# Patient Record
Sex: Female | Born: 1965
Health system: Southern US, Community
[De-identification: ages and names within clinical notes are randomized; demographics above are authoritative.]

## PROBLEM LIST (undated history)

## (undated) DIAGNOSIS — I2510 Atherosclerotic heart disease of native coronary artery without angina pectoris: Secondary | ICD-10-CM

## (undated) DIAGNOSIS — G4700 Insomnia, unspecified: Secondary | ICD-10-CM

## (undated) DIAGNOSIS — Z95810 Presence of automatic (implantable) cardiac defibrillator: Secondary | ICD-10-CM

## (undated) DIAGNOSIS — I219 Acute myocardial infarction, unspecified: Secondary | ICD-10-CM

## (undated) DIAGNOSIS — M329 Systemic lupus erythematosus, unspecified: Secondary | ICD-10-CM

## (undated) DIAGNOSIS — G8929 Other chronic pain: Secondary | ICD-10-CM

## (undated) DIAGNOSIS — I4729 Other ventricular tachycardia: Secondary | ICD-10-CM

## (undated) DIAGNOSIS — I255 Ischemic cardiomyopathy: Secondary | ICD-10-CM

## (undated) DIAGNOSIS — F329 Major depressive disorder, single episode, unspecified: Secondary | ICD-10-CM

## (undated) DIAGNOSIS — I4901 Ventricular fibrillation: Secondary | ICD-10-CM

## (undated) DIAGNOSIS — Z720 Tobacco use: Secondary | ICD-10-CM

## (undated) DIAGNOSIS — I429 Cardiomyopathy, unspecified: Secondary | ICD-10-CM

## (undated) DIAGNOSIS — E785 Hyperlipidemia, unspecified: Secondary | ICD-10-CM

## (undated) DIAGNOSIS — N052 Unspecified nephritic syndrome with diffuse membranous glomerulonephritis: Secondary | ICD-10-CM

## (undated) DIAGNOSIS — I472 Ventricular tachycardia, unspecified: Secondary | ICD-10-CM

## (undated) DIAGNOSIS — F32A Depression, unspecified: Secondary | ICD-10-CM

## (undated) DIAGNOSIS — G43909 Migraine, unspecified, not intractable, without status migrainosus: Secondary | ICD-10-CM

## (undated) DIAGNOSIS — I5020 Unspecified systolic (congestive) heart failure: Secondary | ICD-10-CM

## (undated) DIAGNOSIS — M1710 Unilateral primary osteoarthritis, unspecified knee: Secondary | ICD-10-CM

## (undated) DIAGNOSIS — K219 Gastro-esophageal reflux disease without esophagitis: Secondary | ICD-10-CM

## (undated) HISTORY — DX: Unilateral primary osteoarthritis, unspecified knee: M17.10

## (undated) HISTORY — DX: Gastro-esophageal reflux disease without esophagitis: K21.9

## (undated) HISTORY — DX: Tobacco use: Z72.0

## (undated) HISTORY — DX: Depression, unspecified: F32.A

## (undated) HISTORY — DX: Hyperlipidemia, unspecified: E78.5

## (undated) HISTORY — DX: Insomnia, unspecified: G47.00

## (undated) HISTORY — DX: Presence of automatic (implantable) cardiac defibrillator: Z95.810

## (undated) HISTORY — PX: ABDOMINAL HYSTERECTOMY: SHX81

## (undated) HISTORY — DX: Cardiomyopathy, unspecified: I42.9

## (undated) HISTORY — DX: Migraine, unspecified, not intractable, without status migrainosus: G43.909

## (undated) HISTORY — DX: Ischemic cardiomyopathy: I25.5

## (undated) HISTORY — PX: CHOLECYSTECTOMY: SHX55

## (undated) HISTORY — DX: Major depressive disorder, single episode, unspecified: F32.9

## (undated) HISTORY — DX: Systemic lupus erythematosus, unspecified: M32.9

## (undated) HISTORY — PX: OTHER SURGICAL HISTORY: SHX169

---

## 1999-10-05 ENCOUNTER — Emergency Department (HOSPITAL_COMMUNITY): Admission: EM | Admit: 1999-10-05 | Discharge: 1999-10-05 | Payer: Self-pay

## 2000-08-18 HISTORY — PX: ROTATOR CUFF REPAIR: SHX139

## 2000-12-17 ENCOUNTER — Encounter (HOSPITAL_COMMUNITY): Admission: RE | Admit: 2000-12-17 | Discharge: 2001-01-16 | Payer: Self-pay | Admitting: Rheumatology

## 2000-12-31 ENCOUNTER — Emergency Department (HOSPITAL_COMMUNITY): Admission: EM | Admit: 2000-12-31 | Discharge: 2000-12-31 | Payer: Self-pay | Admitting: Emergency Medicine

## 2001-01-01 ENCOUNTER — Ambulatory Visit (HOSPITAL_COMMUNITY): Admission: RE | Admit: 2001-01-01 | Discharge: 2001-01-01 | Payer: Self-pay | Admitting: *Deleted

## 2001-01-01 ENCOUNTER — Encounter: Payer: Self-pay | Admitting: *Deleted

## 2001-03-15 ENCOUNTER — Encounter: Payer: Self-pay | Admitting: Family Medicine

## 2001-03-15 ENCOUNTER — Ambulatory Visit (HOSPITAL_COMMUNITY): Admission: RE | Admit: 2001-03-15 | Discharge: 2001-03-15 | Payer: Self-pay | Admitting: Family Medicine

## 2001-06-12 ENCOUNTER — Emergency Department (HOSPITAL_COMMUNITY): Admission: EM | Admit: 2001-06-12 | Discharge: 2001-06-12 | Payer: Self-pay | Admitting: Emergency Medicine

## 2001-06-30 ENCOUNTER — Encounter (HOSPITAL_COMMUNITY): Admission: RE | Admit: 2001-06-30 | Discharge: 2001-07-30 | Payer: Self-pay | Admitting: Family Medicine

## 2001-07-13 ENCOUNTER — Ambulatory Visit (HOSPITAL_COMMUNITY): Admission: RE | Admit: 2001-07-13 | Discharge: 2001-07-13 | Payer: Self-pay | Admitting: Orthopaedic Surgery

## 2001-07-13 ENCOUNTER — Encounter: Payer: Self-pay | Admitting: Orthopaedic Surgery

## 2001-07-30 ENCOUNTER — Ambulatory Visit (HOSPITAL_COMMUNITY): Admission: RE | Admit: 2001-07-30 | Discharge: 2001-07-30 | Payer: Self-pay | Admitting: Family Medicine

## 2001-07-30 ENCOUNTER — Encounter: Payer: Self-pay | Admitting: Family Medicine

## 2001-08-09 ENCOUNTER — Encounter (HOSPITAL_COMMUNITY): Admission: RE | Admit: 2001-08-09 | Discharge: 2001-09-08 | Payer: Self-pay | Admitting: Family Medicine

## 2001-09-15 ENCOUNTER — Emergency Department (HOSPITAL_COMMUNITY): Admission: EM | Admit: 2001-09-15 | Discharge: 2001-09-15 | Payer: Self-pay | Admitting: Emergency Medicine

## 2001-10-13 ENCOUNTER — Inpatient Hospital Stay (HOSPITAL_COMMUNITY): Admission: AD | Admit: 2001-10-13 | Discharge: 2001-10-16 | Payer: Self-pay | Admitting: *Deleted

## 2001-10-13 ENCOUNTER — Emergency Department (HOSPITAL_COMMUNITY): Admission: EM | Admit: 2001-10-13 | Discharge: 2001-10-13 | Payer: Self-pay | Admitting: Emergency Medicine

## 2001-10-13 ENCOUNTER — Encounter: Payer: Self-pay | Admitting: Emergency Medicine

## 2001-11-29 ENCOUNTER — Emergency Department (HOSPITAL_COMMUNITY): Admission: EM | Admit: 2001-11-29 | Discharge: 2001-11-30 | Payer: Self-pay | Admitting: Emergency Medicine

## 2001-11-29 ENCOUNTER — Encounter: Payer: Self-pay | Admitting: Emergency Medicine

## 2001-12-29 ENCOUNTER — Encounter: Payer: Self-pay | Admitting: Emergency Medicine

## 2001-12-29 ENCOUNTER — Emergency Department (HOSPITAL_COMMUNITY): Admission: EM | Admit: 2001-12-29 | Discharge: 2001-12-29 | Payer: Self-pay | Admitting: Emergency Medicine

## 2001-12-30 ENCOUNTER — Inpatient Hospital Stay (HOSPITAL_COMMUNITY): Admission: EM | Admit: 2001-12-30 | Discharge: 2002-01-02 | Payer: Self-pay | Admitting: Cardiology

## 2002-01-12 ENCOUNTER — Encounter: Payer: Self-pay | Admitting: Family Medicine

## 2002-01-12 ENCOUNTER — Ambulatory Visit (HOSPITAL_COMMUNITY): Admission: RE | Admit: 2002-01-12 | Discharge: 2002-01-12 | Payer: Self-pay | Admitting: Family Medicine

## 2002-01-14 ENCOUNTER — Ambulatory Visit (HOSPITAL_COMMUNITY): Admission: RE | Admit: 2002-01-14 | Discharge: 2002-01-14 | Payer: Self-pay | Admitting: Cardiology

## 2002-10-06 ENCOUNTER — Encounter: Payer: Self-pay | Admitting: *Deleted

## 2002-10-06 ENCOUNTER — Inpatient Hospital Stay (HOSPITAL_COMMUNITY): Admission: EM | Admit: 2002-10-06 | Discharge: 2002-10-09 | Payer: Self-pay | Admitting: Emergency Medicine

## 2002-10-07 ENCOUNTER — Encounter: Payer: Self-pay | Admitting: Vascular Surgery

## 2002-10-13 ENCOUNTER — Ambulatory Visit (HOSPITAL_COMMUNITY): Admission: RE | Admit: 2002-10-13 | Discharge: 2002-10-13 | Payer: Self-pay | Admitting: *Deleted

## 2002-12-13 ENCOUNTER — Observation Stay (HOSPITAL_COMMUNITY): Admission: RE | Admit: 2002-12-13 | Discharge: 2002-12-14 | Payer: Self-pay | Admitting: General Surgery

## 2003-02-09 ENCOUNTER — Encounter: Payer: Self-pay | Admitting: Emergency Medicine

## 2003-02-09 ENCOUNTER — Emergency Department (HOSPITAL_COMMUNITY): Admission: EM | Admit: 2003-02-09 | Discharge: 2003-02-10 | Payer: Self-pay | Admitting: Emergency Medicine

## 2003-06-29 ENCOUNTER — Inpatient Hospital Stay (HOSPITAL_COMMUNITY): Admission: EM | Admit: 2003-06-29 | Discharge: 2003-07-01 | Payer: Self-pay | Admitting: Emergency Medicine

## 2003-06-30 ENCOUNTER — Encounter: Payer: Self-pay | Admitting: Cardiology

## 2003-08-14 ENCOUNTER — Emergency Department (HOSPITAL_COMMUNITY): Admission: EM | Admit: 2003-08-14 | Discharge: 2003-08-14 | Payer: Self-pay | Admitting: Emergency Medicine

## 2003-09-02 ENCOUNTER — Emergency Department (HOSPITAL_COMMUNITY): Admission: EM | Admit: 2003-09-02 | Discharge: 2003-09-02 | Payer: Self-pay | Admitting: Emergency Medicine

## 2003-11-08 ENCOUNTER — Ambulatory Visit (HOSPITAL_COMMUNITY): Admission: RE | Admit: 2003-11-08 | Discharge: 2003-11-08 | Payer: Self-pay | Admitting: Orthopedic Surgery

## 2003-11-20 ENCOUNTER — Emergency Department (HOSPITAL_COMMUNITY): Admission: EM | Admit: 2003-11-20 | Discharge: 2003-11-21 | Payer: Self-pay | Admitting: *Deleted

## 2004-01-18 ENCOUNTER — Ambulatory Visit (HOSPITAL_COMMUNITY): Admission: RE | Admit: 2004-01-18 | Discharge: 2004-01-18 | Payer: Self-pay | Admitting: Family Medicine

## 2004-03-24 ENCOUNTER — Emergency Department (HOSPITAL_COMMUNITY): Admission: EM | Admit: 2004-03-24 | Discharge: 2004-03-24 | Payer: Self-pay | Admitting: Emergency Medicine

## 2004-06-26 ENCOUNTER — Ambulatory Visit: Payer: Self-pay | Admitting: Family Medicine

## 2004-08-27 ENCOUNTER — Ambulatory Visit: Payer: Self-pay | Admitting: *Deleted

## 2004-09-23 ENCOUNTER — Encounter: Payer: Self-pay | Admitting: Family Medicine

## 2004-10-15 ENCOUNTER — Ambulatory Visit: Payer: Self-pay | Admitting: Family Medicine

## 2004-10-25 ENCOUNTER — Ambulatory Visit (HOSPITAL_COMMUNITY): Admission: RE | Admit: 2004-10-25 | Discharge: 2004-10-25 | Payer: Self-pay | Admitting: Family Medicine

## 2005-02-19 ENCOUNTER — Ambulatory Visit: Payer: Self-pay | Admitting: Family Medicine

## 2005-03-25 ENCOUNTER — Emergency Department (HOSPITAL_COMMUNITY): Admission: EM | Admit: 2005-03-25 | Discharge: 2005-03-26 | Payer: Self-pay | Admitting: *Deleted

## 2005-04-02 ENCOUNTER — Ambulatory Visit: Payer: Self-pay | Admitting: Family Medicine

## 2005-04-02 ENCOUNTER — Ambulatory Visit (HOSPITAL_COMMUNITY): Admission: RE | Admit: 2005-04-02 | Discharge: 2005-04-02 | Payer: Self-pay | Admitting: Family Medicine

## 2005-04-10 ENCOUNTER — Ambulatory Visit: Payer: Self-pay | Admitting: *Deleted

## 2005-05-25 ENCOUNTER — Emergency Department (HOSPITAL_COMMUNITY): Admission: EM | Admit: 2005-05-25 | Discharge: 2005-05-26 | Payer: Self-pay | Admitting: Emergency Medicine

## 2005-08-21 ENCOUNTER — Ambulatory Visit: Payer: Self-pay | Admitting: Family Medicine

## 2005-10-09 ENCOUNTER — Ambulatory Visit: Payer: Self-pay | Admitting: Family Medicine

## 2005-10-13 ENCOUNTER — Ambulatory Visit (HOSPITAL_COMMUNITY): Admission: RE | Admit: 2005-10-13 | Discharge: 2005-10-13 | Payer: Self-pay | Admitting: Family Medicine

## 2005-11-04 ENCOUNTER — Inpatient Hospital Stay (HOSPITAL_COMMUNITY): Admission: EM | Admit: 2005-11-04 | Discharge: 2005-11-07 | Payer: Self-pay | Admitting: Cardiovascular Disease

## 2005-11-04 ENCOUNTER — Encounter: Payer: Self-pay | Admitting: Emergency Medicine

## 2005-11-04 ENCOUNTER — Ambulatory Visit: Payer: Self-pay | Admitting: Cardiology

## 2005-11-04 ENCOUNTER — Ambulatory Visit: Payer: Self-pay | Admitting: Cardiovascular Disease

## 2005-11-11 ENCOUNTER — Ambulatory Visit: Payer: Self-pay | Admitting: Family Medicine

## 2005-11-19 ENCOUNTER — Ambulatory Visit: Payer: Self-pay | Admitting: *Deleted

## 2005-11-25 ENCOUNTER — Ambulatory Visit: Payer: Self-pay | Admitting: Cardiology

## 2005-12-15 ENCOUNTER — Ambulatory Visit: Payer: Self-pay | Admitting: Internal Medicine

## 2005-12-23 ENCOUNTER — Ambulatory Visit: Payer: Self-pay | Admitting: Family Medicine

## 2005-12-26 ENCOUNTER — Ambulatory Visit: Payer: Self-pay | Admitting: Internal Medicine

## 2006-01-02 ENCOUNTER — Ambulatory Visit: Payer: Self-pay | Admitting: Internal Medicine

## 2006-01-02 ENCOUNTER — Inpatient Hospital Stay (HOSPITAL_COMMUNITY): Admission: RE | Admit: 2006-01-02 | Discharge: 2006-01-03 | Payer: Self-pay | Admitting: Internal Medicine

## 2006-01-20 ENCOUNTER — Emergency Department (HOSPITAL_COMMUNITY): Admission: EM | Admit: 2006-01-20 | Discharge: 2006-01-20 | Payer: Self-pay | Admitting: Emergency Medicine

## 2006-01-21 ENCOUNTER — Ambulatory Visit: Payer: Self-pay

## 2006-03-25 ENCOUNTER — Ambulatory Visit: Payer: Self-pay | Admitting: Family Medicine

## 2006-03-31 ENCOUNTER — Ambulatory Visit: Payer: Self-pay | Admitting: Internal Medicine

## 2006-06-15 ENCOUNTER — Ambulatory Visit: Payer: Self-pay

## 2006-06-25 ENCOUNTER — Ambulatory Visit: Payer: Self-pay | Admitting: Family Medicine

## 2006-06-30 ENCOUNTER — Ambulatory Visit (HOSPITAL_COMMUNITY): Admission: RE | Admit: 2006-06-30 | Discharge: 2006-06-30 | Payer: Self-pay | Admitting: Family Medicine

## 2006-07-01 ENCOUNTER — Ambulatory Visit: Payer: Self-pay | Admitting: Internal Medicine

## 2006-09-28 ENCOUNTER — Ambulatory Visit (HOSPITAL_COMMUNITY): Admission: RE | Admit: 2006-09-28 | Discharge: 2006-09-28 | Payer: Self-pay | Admitting: Family Medicine

## 2006-09-28 ENCOUNTER — Ambulatory Visit: Payer: Self-pay | Admitting: Family Medicine

## 2006-09-28 LAB — CONVERTED CEMR LAB
ALT: 38 units/L — ABNORMAL HIGH (ref 0–35)
AST: 29 units/L (ref 0–37)
Alkaline Phosphatase: 123 units/L — ABNORMAL HIGH (ref 39–117)
Basophils Absolute: 0 10*3/uL (ref 0.0–0.1)
Basophils Relative: 1 % (ref 0–1)
Calcium: 9.4 mg/dL (ref 8.4–10.5)
Chloride: 107 meq/L (ref 96–112)
Creatinine, Ser: 0.67 mg/dL (ref 0.40–1.20)
HCT: 39.8 % (ref 36.0–46.0)
HDL: 27 mg/dL — ABNORMAL LOW (ref >39)
Hemoglobin: 12.4 g/dL (ref 12.0–15.0)
Indirect Bilirubin: 0.5 mg/dL (ref 0.0–0.9)
Lymphocytes Relative: 40 % (ref 12–46)
MCHC: 31.2 g/dL (ref 30.0–36.0)
Monocytes Relative: 6 % (ref 3–11)
Neutro Abs: 2.1 10*3/uL (ref 1.7–7.7)
Neutrophils Relative %: 51 % (ref 43–77)
Platelets: 303 10*3/uL (ref 150–400)
Total Bilirubin: 0.6 mg/dL (ref 0.3–1.2)
Triglycerides: 115 mg/dL (ref <150)

## 2006-09-30 ENCOUNTER — Ambulatory Visit (HOSPITAL_COMMUNITY): Admission: RE | Admit: 2006-09-30 | Discharge: 2006-09-30 | Payer: Self-pay | Admitting: Family Medicine

## 2006-10-01 ENCOUNTER — Ambulatory Visit: Payer: Self-pay | Admitting: Internal Medicine

## 2006-10-27 ENCOUNTER — Emergency Department (HOSPITAL_COMMUNITY): Admission: EM | Admit: 2006-10-27 | Discharge: 2006-10-28 | Payer: Self-pay | Admitting: Emergency Medicine

## 2006-10-29 ENCOUNTER — Ambulatory Visit: Payer: Self-pay | Admitting: Internal Medicine

## 2007-01-06 ENCOUNTER — Emergency Department (HOSPITAL_COMMUNITY): Admission: EM | Admit: 2007-01-06 | Discharge: 2007-01-06 | Payer: Self-pay | Admitting: Emergency Medicine

## 2007-02-03 ENCOUNTER — Encounter: Payer: Self-pay | Admitting: Family Medicine

## 2007-02-03 ENCOUNTER — Other Ambulatory Visit: Admission: RE | Admit: 2007-02-03 | Discharge: 2007-02-03 | Payer: Self-pay | Admitting: Family Medicine

## 2007-02-03 ENCOUNTER — Ambulatory Visit: Payer: Self-pay | Admitting: Family Medicine

## 2007-02-04 ENCOUNTER — Encounter: Payer: Self-pay | Admitting: Family Medicine

## 2007-02-04 LAB — CONVERTED CEMR LAB
Candida species: NEGATIVE
Cholesterol: 153 mg/dL (ref 0–200)
LDL Cholesterol: 100 mg/dL — ABNORMAL HIGH (ref 0–99)
Trichomonal Vaginitis: NEGATIVE
Triglycerides: 108 mg/dL (ref <150)

## 2007-04-20 ENCOUNTER — Ambulatory Visit: Payer: Self-pay | Admitting: Internal Medicine

## 2007-05-12 ENCOUNTER — Ambulatory Visit: Payer: Self-pay | Admitting: Family Medicine

## 2007-05-27 ENCOUNTER — Ambulatory Visit: Payer: Self-pay | Admitting: Internal Medicine

## 2007-07-03 ENCOUNTER — Emergency Department (HOSPITAL_COMMUNITY): Admission: EM | Admit: 2007-07-03 | Discharge: 2007-07-03 | Payer: Self-pay | Admitting: Family Medicine

## 2007-08-18 ENCOUNTER — Ambulatory Visit: Payer: Self-pay | Admitting: Internal Medicine

## 2007-08-19 ENCOUNTER — Encounter: Payer: Self-pay | Admitting: Family Medicine

## 2007-08-31 ENCOUNTER — Encounter: Payer: Self-pay | Admitting: Family Medicine

## 2007-08-31 DIAGNOSIS — F411 Generalized anxiety disorder: Secondary | ICD-10-CM | POA: Insufficient documentation

## 2007-08-31 DIAGNOSIS — F418 Other specified anxiety disorders: Secondary | ICD-10-CM | POA: Insufficient documentation

## 2007-08-31 DIAGNOSIS — J440 Chronic obstructive pulmonary disease with acute lower respiratory infection: Secondary | ICD-10-CM

## 2007-08-31 DIAGNOSIS — I10 Essential (primary) hypertension: Secondary | ICD-10-CM | POA: Insufficient documentation

## 2007-08-31 DIAGNOSIS — R599 Enlarged lymph nodes, unspecified: Secondary | ICD-10-CM | POA: Insufficient documentation

## 2007-08-31 DIAGNOSIS — M8700 Idiopathic aseptic necrosis of unspecified bone: Secondary | ICD-10-CM | POA: Insufficient documentation

## 2007-08-31 DIAGNOSIS — F329 Major depressive disorder, single episode, unspecified: Secondary | ICD-10-CM | POA: Insufficient documentation

## 2007-08-31 DIAGNOSIS — F17200 Nicotine dependence, unspecified, uncomplicated: Secondary | ICD-10-CM | POA: Insufficient documentation

## 2007-08-31 DIAGNOSIS — E785 Hyperlipidemia, unspecified: Secondary | ICD-10-CM | POA: Insufficient documentation

## 2007-08-31 DIAGNOSIS — M129 Arthropathy, unspecified: Secondary | ICD-10-CM | POA: Insufficient documentation

## 2007-08-31 DIAGNOSIS — K219 Gastro-esophageal reflux disease without esophagitis: Secondary | ICD-10-CM | POA: Insufficient documentation

## 2007-08-31 DIAGNOSIS — R7302 Impaired glucose tolerance (oral): Secondary | ICD-10-CM | POA: Insufficient documentation

## 2007-08-31 DIAGNOSIS — Z87898 Personal history of other specified conditions: Secondary | ICD-10-CM | POA: Insufficient documentation

## 2007-08-31 DIAGNOSIS — F5105 Insomnia due to other mental disorder: Secondary | ICD-10-CM

## 2007-08-31 DIAGNOSIS — J209 Acute bronchitis, unspecified: Secondary | ICD-10-CM | POA: Insufficient documentation

## 2007-08-31 DIAGNOSIS — M329 Systemic lupus erythematosus, unspecified: Secondary | ICD-10-CM | POA: Insufficient documentation

## 2007-11-02 ENCOUNTER — Ambulatory Visit: Payer: Self-pay | Admitting: Internal Medicine

## 2007-12-08 ENCOUNTER — Emergency Department (HOSPITAL_COMMUNITY): Admission: EM | Admit: 2007-12-08 | Discharge: 2007-12-08 | Payer: Self-pay | Admitting: Emergency Medicine

## 2008-02-10 ENCOUNTER — Ambulatory Visit: Payer: Self-pay | Admitting: Internal Medicine

## 2008-02-22 ENCOUNTER — Ambulatory Visit: Payer: Self-pay | Admitting: Internal Medicine

## 2008-03-15 ENCOUNTER — Encounter: Payer: Self-pay | Admitting: Family Medicine

## 2008-03-15 ENCOUNTER — Ambulatory Visit (HOSPITAL_COMMUNITY): Admission: RE | Admit: 2008-03-15 | Discharge: 2008-03-15 | Payer: Self-pay | Admitting: Family Medicine

## 2008-03-15 ENCOUNTER — Ambulatory Visit: Payer: Self-pay | Admitting: Family Medicine

## 2008-03-15 LAB — CONVERTED CEMR LAB: Glucose, Bld: 143 mg/dL

## 2008-03-22 ENCOUNTER — Telehealth: Payer: Self-pay | Admitting: Family Medicine

## 2008-03-24 ENCOUNTER — Encounter: Admission: RE | Admit: 2008-03-24 | Discharge: 2008-03-24 | Payer: Self-pay | Admitting: Family Medicine

## 2008-06-08 ENCOUNTER — Ambulatory Visit: Payer: Self-pay | Admitting: Family Medicine

## 2008-06-13 ENCOUNTER — Encounter (INDEPENDENT_AMBULATORY_CARE_PROVIDER_SITE_OTHER): Payer: Self-pay | Admitting: *Deleted

## 2008-07-15 ENCOUNTER — Emergency Department (HOSPITAL_COMMUNITY): Admission: EM | Admit: 2008-07-15 | Discharge: 2008-07-15 | Payer: Self-pay | Admitting: Emergency Medicine

## 2008-08-01 ENCOUNTER — Telehealth: Payer: Self-pay | Admitting: Family Medicine

## 2008-09-05 ENCOUNTER — Ambulatory Visit: Payer: Self-pay | Admitting: Internal Medicine

## 2008-09-14 ENCOUNTER — Encounter: Payer: Self-pay | Admitting: Family Medicine

## 2008-10-25 ENCOUNTER — Encounter: Payer: Self-pay | Admitting: Family Medicine

## 2008-10-26 ENCOUNTER — Encounter: Payer: Self-pay | Admitting: Internal Medicine

## 2008-11-03 ENCOUNTER — Ambulatory Visit: Payer: Self-pay | Admitting: *Deleted

## 2008-11-04 ENCOUNTER — Encounter: Payer: Self-pay | Admitting: Emergency Medicine

## 2008-11-04 ENCOUNTER — Inpatient Hospital Stay (HOSPITAL_COMMUNITY): Admission: AD | Admit: 2008-11-04 | Discharge: 2008-11-05 | Payer: Self-pay | Admitting: Cardiology

## 2008-11-08 ENCOUNTER — Ambulatory Visit: Payer: Self-pay | Admitting: Family Medicine

## 2008-11-10 ENCOUNTER — Ambulatory Visit: Payer: Self-pay | Admitting: Internal Medicine

## 2008-11-14 ENCOUNTER — Encounter: Payer: Self-pay | Admitting: Family Medicine

## 2008-11-24 ENCOUNTER — Encounter: Payer: Self-pay | Admitting: Family Medicine

## 2008-11-27 ENCOUNTER — Ambulatory Visit: Payer: Self-pay | Admitting: Internal Medicine

## 2008-11-27 LAB — CONVERTED CEMR LAB
BUN: 6 mg/dL (ref 6–23)
CO2: 28 meq/L (ref 19–32)
Calcium: 9 mg/dL (ref 8.4–10.5)
Chloride: 109 meq/L (ref 96–112)
Creatinine, Ser: 0.6 mg/dL (ref 0.4–1.2)
GFR calc non Af Amer: 139.35 mL/min (ref >60)
Glucose, Bld: 102 mg/dL — ABNORMAL HIGH (ref 70–99)
Potassium: 4.3 meq/L (ref 3.5–5.1)
Sodium: 140 meq/L (ref 135–145)

## 2008-12-20 ENCOUNTER — Ambulatory Visit: Payer: Self-pay | Admitting: Family Medicine

## 2009-01-18 ENCOUNTER — Emergency Department (HOSPITAL_COMMUNITY): Admission: EM | Admit: 2009-01-18 | Discharge: 2009-01-19 | Payer: Self-pay | Admitting: Emergency Medicine

## 2009-02-04 DIAGNOSIS — I4901 Ventricular fibrillation: Secondary | ICD-10-CM | POA: Insufficient documentation

## 2009-02-06 ENCOUNTER — Ambulatory Visit: Payer: Self-pay | Admitting: Internal Medicine

## 2009-02-22 ENCOUNTER — Ambulatory Visit: Payer: Self-pay | Admitting: Family Medicine

## 2009-02-22 DIAGNOSIS — Z8719 Personal history of other diseases of the digestive system: Secondary | ICD-10-CM | POA: Insufficient documentation

## 2009-02-22 LAB — CONVERTED CEMR LAB: Hgb A1c MFr Bld: 6.2 %

## 2009-02-23 LAB — CONVERTED CEMR LAB
ALT: 23 units/L (ref 0–35)
AST: 21 units/L (ref 0–37)
Albumin: 3.8 g/dL (ref 3.5–5.2)
Alkaline Phosphatase: 93 units/L (ref 39–117)
BUN: 7 mg/dL (ref 6–23)
Bilirubin, Direct: 0.1 mg/dL (ref 0.0–0.3)
Calcium: 9.2 mg/dL (ref 8.4–10.5)
Chloride: 108 meq/L (ref 96–112)
Cholesterol: 164 mg/dL (ref 0–200)
Creatinine, Ser: 0.74 mg/dL (ref 0.40–1.20)
LDL Cholesterol: 113 mg/dL — ABNORMAL HIGH (ref 0–99)
Sodium: 140 meq/L (ref 135–145)
Total Bilirubin: 0.3 mg/dL (ref 0.3–1.2)
Total CHOL/HDL Ratio: 5.3
Total Protein: 7.7 g/dL (ref 6.0–8.3)
VLDL: 20 mg/dL (ref 0–40)

## 2009-04-30 ENCOUNTER — Encounter: Payer: Self-pay | Admitting: Family Medicine

## 2009-05-11 ENCOUNTER — Encounter: Payer: Self-pay | Admitting: Internal Medicine

## 2009-05-30 ENCOUNTER — Ambulatory Visit: Payer: Self-pay | Admitting: Internal Medicine

## 2009-06-05 ENCOUNTER — Ambulatory Visit: Payer: Self-pay | Admitting: Family Medicine

## 2009-06-05 DIAGNOSIS — R5383 Other fatigue: Secondary | ICD-10-CM

## 2009-06-05 DIAGNOSIS — R5381 Other malaise: Secondary | ICD-10-CM | POA: Insufficient documentation

## 2009-06-05 LAB — CONVERTED CEMR LAB
BUN: 6 mg/dL (ref 6–23)
Bilirubin, Direct: 0.1 mg/dL (ref 0.0–0.3)
Chloride: 109 meq/L (ref 96–112)
Hgb A1c MFr Bld: 6.3 %
Indirect Bilirubin: 0.4 mg/dL (ref 0.0–0.9)
LDL Cholesterol: 103 mg/dL — ABNORMAL HIGH (ref 0–99)
Potassium: 4.3 meq/L (ref 3.5–5.3)
Total Bilirubin: 0.5 mg/dL (ref 0.3–1.2)
Triglycerides: 74 mg/dL (ref <150)
VLDL: 15 mg/dL (ref 0–40)

## 2009-06-07 ENCOUNTER — Encounter: Payer: Self-pay | Admitting: Internal Medicine

## 2009-08-31 ENCOUNTER — Encounter: Payer: Self-pay | Admitting: Internal Medicine

## 2009-09-09 ENCOUNTER — Encounter: Payer: Self-pay | Admitting: Family Medicine

## 2009-09-13 ENCOUNTER — Encounter: Payer: Self-pay | Admitting: Family Medicine

## 2009-09-15 ENCOUNTER — Emergency Department (HOSPITAL_COMMUNITY): Admission: EM | Admit: 2009-09-15 | Discharge: 2009-09-15 | Payer: Self-pay | Admitting: Emergency Medicine

## 2009-09-21 ENCOUNTER — Encounter: Payer: Self-pay | Admitting: Internal Medicine

## 2009-10-08 ENCOUNTER — Ambulatory Visit: Payer: Self-pay | Admitting: Internal Medicine

## 2009-10-12 ENCOUNTER — Emergency Department (HOSPITAL_COMMUNITY): Admission: EM | Admit: 2009-10-12 | Discharge: 2009-10-12 | Payer: Self-pay | Admitting: Emergency Medicine

## 2009-10-22 ENCOUNTER — Encounter: Payer: Self-pay | Admitting: Internal Medicine

## 2009-10-24 ENCOUNTER — Encounter: Payer: Self-pay | Admitting: Physician Assistant

## 2009-10-24 ENCOUNTER — Encounter (INDEPENDENT_AMBULATORY_CARE_PROVIDER_SITE_OTHER): Payer: Self-pay | Admitting: *Deleted

## 2009-10-24 ENCOUNTER — Ambulatory Visit: Payer: Self-pay | Admitting: Internal Medicine

## 2009-10-30 ENCOUNTER — Telehealth (INDEPENDENT_AMBULATORY_CARE_PROVIDER_SITE_OTHER): Payer: Self-pay | Admitting: *Deleted

## 2009-10-31 ENCOUNTER — Encounter (HOSPITAL_COMMUNITY): Admission: RE | Admit: 2009-10-31 | Discharge: 2009-12-19 | Payer: Self-pay | Admitting: Internal Medicine

## 2009-10-31 ENCOUNTER — Ambulatory Visit: Payer: Self-pay | Admitting: Cardiology

## 2009-10-31 ENCOUNTER — Ambulatory Visit: Payer: Self-pay

## 2009-11-30 ENCOUNTER — Ambulatory Visit: Payer: Self-pay | Admitting: Internal Medicine

## 2010-01-18 ENCOUNTER — Ambulatory Visit: Payer: Self-pay | Admitting: Family Medicine

## 2010-01-18 DIAGNOSIS — N760 Acute vaginitis: Secondary | ICD-10-CM | POA: Insufficient documentation

## 2010-01-21 ENCOUNTER — Encounter: Payer: Self-pay | Admitting: Physician Assistant

## 2010-01-21 LAB — CONVERTED CEMR LAB: Chlamydia, DNA Probe: NEGATIVE

## 2010-01-22 ENCOUNTER — Encounter: Payer: Self-pay | Admitting: Physician Assistant

## 2010-01-24 ENCOUNTER — Ambulatory Visit: Payer: Self-pay | Admitting: Family Medicine

## 2010-01-30 ENCOUNTER — Encounter: Admission: RE | Admit: 2010-01-30 | Discharge: 2010-01-30 | Payer: Self-pay | Admitting: Family Medicine

## 2010-03-05 ENCOUNTER — Encounter: Payer: Self-pay | Admitting: Internal Medicine

## 2010-03-08 ENCOUNTER — Telehealth: Payer: Self-pay | Admitting: Family Medicine

## 2010-03-12 ENCOUNTER — Ambulatory Visit: Payer: Self-pay | Admitting: Internal Medicine

## 2010-04-05 ENCOUNTER — Encounter: Payer: Self-pay | Admitting: Internal Medicine

## 2010-04-23 ENCOUNTER — Telehealth (INDEPENDENT_AMBULATORY_CARE_PROVIDER_SITE_OTHER): Payer: Self-pay | Admitting: *Deleted

## 2010-05-10 ENCOUNTER — Encounter: Payer: Self-pay | Admitting: Family Medicine

## 2010-06-06 ENCOUNTER — Ambulatory Visit: Payer: Self-pay | Admitting: Internal Medicine

## 2010-07-26 ENCOUNTER — Encounter: Payer: Self-pay | Admitting: Family Medicine

## 2010-08-07 ENCOUNTER — Ambulatory Visit (HOSPITAL_COMMUNITY)
Admission: RE | Admit: 2010-08-07 | Discharge: 2010-08-07 | Payer: Self-pay | Source: Home / Self Care | Attending: Family Medicine | Admitting: Family Medicine

## 2010-08-07 ENCOUNTER — Ambulatory Visit: Payer: Self-pay | Admitting: Family Medicine

## 2010-08-07 DIAGNOSIS — N10 Acute tubulo-interstitial nephritis: Secondary | ICD-10-CM | POA: Insufficient documentation

## 2010-08-07 DIAGNOSIS — N3000 Acute cystitis without hematuria: Secondary | ICD-10-CM | POA: Insufficient documentation

## 2010-08-07 LAB — CONVERTED CEMR LAB
Basophils Absolute: 0 10*3/uL (ref 0.0–0.1)
Basophils Relative: 0 % (ref 0–1)
Bilirubin Urine: NEGATIVE
Glucose, Urine, Semiquant: NEGATIVE
Lymphs Abs: 2.8 10*3/uL (ref 0.7–4.0)
Monocytes Absolute: 0.4 10*3/uL (ref 0.1–1.0)
Neutro Abs: 3.3 10*3/uL (ref 1.7–7.7)
Protein, U semiquant: 100
RBC: 4.64 M/uL (ref 3.87–5.11)
RDW: 13.4 % (ref 11.5–15.5)
Specific Gravity, Urine: 1.025
pH: 5.5

## 2010-08-08 ENCOUNTER — Encounter: Payer: Self-pay | Admitting: Family Medicine

## 2010-08-13 LAB — CONVERTED CEMR LAB
BUN: 8 mg/dL (ref 6–23)
CO2: 23 meq/L (ref 19–32)
Chloride: 109 meq/L (ref 96–112)
Creatinine, Ser: 0.73 mg/dL (ref 0.40–1.20)
Potassium: 4.4 meq/L (ref 3.5–5.3)

## 2010-08-14 ENCOUNTER — Telehealth (INDEPENDENT_AMBULATORY_CARE_PROVIDER_SITE_OTHER): Payer: Self-pay | Admitting: *Deleted

## 2010-09-05 ENCOUNTER — Ambulatory Visit
Admission: RE | Admit: 2010-09-05 | Discharge: 2010-09-05 | Payer: Self-pay | Source: Home / Self Care | Attending: Internal Medicine | Admitting: Internal Medicine

## 2010-09-06 ENCOUNTER — Encounter: Payer: Self-pay | Admitting: Internal Medicine

## 2010-09-08 ENCOUNTER — Encounter: Payer: Self-pay | Admitting: Family Medicine

## 2010-09-09 ENCOUNTER — Encounter: Payer: Self-pay | Admitting: Family Medicine

## 2010-09-17 NOTE — Letter (Signed)
Summary: MISC  MISC   Imported By: Lind Guest 05/10/2010 15:28:17  _____________________________________________________________________  External Attachment:    Type:   Image     Comment:   External Document

## 2010-09-17 NOTE — Letter (Signed)
Summary: OFFICE NOTES  OFFICE NOTES   Imported By: Lind Guest 05/10/2010 15:28:52  _____________________________________________________________________  External Attachment:    Type:   Image     Comment:   External Document

## 2010-09-17 NOTE — Letter (Signed)
Summary: Device-Delinquent Phone Journalist, newspaper, Main Office  [address]   [place], [place] [address]   Phone: [phone]  Fax: [phone]     August 31, 2009 MRN: [id number]   TORI DATTILIO [address] [place], [place]  [address]   Dear Ms. Arredondo,  According to our records, you were scheduled for a device phone transmission on August 28, 2009.     We did not receive any results from this check.  If you transmitted on your scheduled day, please call us to help troubleshoot your system.  If you forgot to send your transmission, please send one upon receipt of this letter.  Thank you,   [facility]

## 2010-09-17 NOTE — Letter (Signed)
Summary: HISTORY AND PHYSICAL  HISTORY AND PHYSICAL   Imported By: Lind Guest 05/10/2010 15:27:22  _____________________________________________________________________  External Attachment:    Type:   Image     Comment:   External Document

## 2010-09-17 NOTE — Letter (Signed)
Summary: Device-Delinquent Phone Journalist, newspaper, Main Office  [address]   [place], [place] [address]   Phone: [phone]  Fax: [phone]     March 05, 2010 MRN: [id number]   SHARRON PETRUSKA [address] [place], [place]  [address]   Dear Ms. Godown,  According to our records, you were scheduled for a device phone transmission on 02-26-2010.     We did not receive any results from this check.  If you transmitted on your scheduled day, please call us to help troubleshoot your system.  If you forgot to send your transmission, please send one upon receipt of this letter.  Thank you,   [facility]

## 2010-09-17 NOTE — Assessment & Plan Note (Signed)
Summary: YEAST INFECTION   Vital Signs:  Patient profile:   45 year old female Menstrual status:  hysterectomy Height:      66 inches Weight:      214 pounds BMI:     34.67 O2 Sat:      95 % Pulse rate:   103 / minute Pulse rhythm:   regular Resp:     16 per minute BP sitting:   102 / 66  (left arm) Cuff size:   large  Nutrition Counseling: Patient's BMI is greater than 25 and therefore counseled on weight management options. CC: Follow up chronic problems   Primary Care Provider:  Syliva Overman MD  CC:  Follow up chronic problems.  History of Present Illness: Reports  that she has been doing well She was recently evaluated and treated for vaginitis with symptom resolution.. Denies recent fever or chills. Denies sinus pressure, nasal congestion , ear pain or sore throat. Denies chest congestion, or cough productive of sputum. Denies chest pain, palpitations, PND, orthopnea or leg swelling. Denies abdominal pain, nausea, vomitting, diarrhea or constipation. Denies change in bowel movements or bloody stool. Denies dysuria , frequency, incontinence or hesitancy. Denies  joint pain, swelling, or reduced mobility. Denies headaches, vertigo, seizures. Reports anxiety and stress because odf the behavior of her adult daughters. Denies  rash, lesions, or itch.She is followed by nephrology and dermatolog at [facility] for her lupus and is doing exceptionally well.      Preventive Screening-Counseling & Management  Alcohol-Tobacco     Smoking Cessation Counseling: yes  Allergies: No Known Drug Allergies  Review of Systems      See HPI Eyes:  Denies blurring and discharge. Endo:  Denies cold intolerance, excessive hunger, excessive thirst, excessive urination, heat intolerance, polyuria, and weight change. Heme:  Denies abnormal bruising and bleeding. Allergy:  Denies hives or rash and itching eyes.  Physical Exam  General:  Well-developed,well-nourished,in no acute  distress; alert,appropriate and cooperative throughout examination HEENT: No facial asymmetry,  EOMI, No sinus tenderness, TM's Clear, oropharynx  pink and moist.   Chest: Clear to auscultation bilaterally.  CVS: S1, S2, No murmurs, No S3.   Abd: Soft, Nontender.  MS: Adequate ROM spine, hips, shoulders and knees.  Ext: No edema.   CNS: CN 2-12 intact, power tone and sensation normal throughout.   Skin: Intact, no visible lesions or rashes.  Psych: Good eye contact, normal affect.  Memory intact, not anxious or depressed appearing.    Impression & Recommendations:  Problem # 1:  OBESITY (ICD-278.00) Assessment Unchanged  Ht: 66 (01/24/2010)   Wt: 214 (01/24/2010)   BMI: 34.67 (01/24/2010) pt encouraged to start regular exercise and reduce caloric intake  Problem # 2:  TOBACCO ABUSE (ICD-305.1) Assessment: Unchanged  Encouraged smoking cessation and discussed different methods for smoking cessation.   Problem # 3:  ANXIETY (ICD-300.00) Assessment: Deteriorated  Her updated medication list for this problem includes:    Alprazolam 1 Mg Tabs (Alprazolam) .Marland Kitchen... 1 by mouth at bedtime  Problem # 4:  DEPRESSION (ICD-311) Assessment: Improved  Her updated medication list for this problem includes:    Alprazolam 1 Mg Tabs (Alprazolam) .Marland Kitchen... 1 by mouth at bedtime  Problem # 5:  DIABETES MELLITUS, TYPE II (ICD-250.00) Assessment: Comment Only  Her updated medication list for this problem includes:    Lisinopril 5 Mg Tabs (Lisinopril) .Marland Kitchen... Take 1 tablet by mouth once a day    Aspirin Ec 325 Mg Tbec (Aspirin) .Marland Kitchen... Take  one tablet by mouth daily  Orders: T- Hemoglobin A1C (04540-98119), past due pt to doa asap  Labs Reviewed: Creat: 0.68 (06/05/2009)    Reviewed HgBA1c results: 6.3 (06/05/2009)  6.2 (02/22/2009)  Problem # 6:  HYPERTENSION (ICD-401.9) Assessment: Unchanged  Her updated medication list for this problem includes:    Carvedilol 6.25 Mg Tabs (Carvedilol)  .Marland Kitchen... Take 1 tablet by mouth two times a day    Lisinopril 5 Mg Tabs (Lisinopril) .Marland Kitchen... Take 1 tablet by mouth once a day  BP today: 102/66 Prior BP: 110/70 (01/18/2010)  Labs Reviewed: K+: 4.3 (06/05/2009) Creat: : 0.68 (06/05/2009)   Chol: 149 (06/05/2009)   HDL: 31 (06/05/2009)   LDL: 103 (06/05/2009)   TG: 74 (06/05/2009)  Complete Medication List: 1)  Diclofenac Sodium 75 Mg Tbec (Diclofenac sodium) .Marland Kitchen.. 1 by mouth once daily 2)  Hydroxychloroquine Sulfate 200 Mg Tabs (Hydroxychloroquine sulfate) .Marland Kitchen.. 1 by mouth two times a day 3)  Alprazolam 1 Mg Tabs (Alprazolam) .Marland Kitchen.. 1 by mouth at bedtime 4)  Cellcept 500 Mg Tabs (Mycophenolate mofetil) .... Take 1 tablet by mouth once a day 5)  Plavix 75 Mg Tabs (Clopidogrel bisulfate) .Marland Kitchen.. 1 by mouth once daily 6)  Klor-con 20 Meq Pack (Potassium chloride) .Marland Kitchen.. 1 by mouth once daily 7)  Carvedilol 6.25 Mg Tabs (Carvedilol) .... Take 1 tablet by mouth two times a day 8)  Lipitor 40 Mg Tabs (Atorvastatin calcium) .Marland Kitchen.. 1 by mouth at bedtime 9)  Spiriva Handihaler 18 Mcg Caps (Tiotropium bromide monohydrate) .Marland Kitchen.. 1 puff once daily 10)  Advair Diskus 100-50 Mcg/dose Misc (Fluticasone-salmeterol) .Marland Kitchen.. 1 puff two times a day 11)  Xopenex Hfa 45 Mcg/act Aero (Levalbuterol tartrate) .... 2 puffs every 6-8 hours prn 12)  Prodigy Blood Glucose Monitor W/device Kit (Blood glucose monitoring suppl) .... Uad once daily testing 13)  Prodigy Blood Glucose Test Strp (Glucose blood) .... Once daily testing 14)  Prodigy Twist Top Lancets 28g Misc (Lancets) .... Uad once daily testing 15)  Omeprazole 20 Mg Cpdr (Omeprazole) .... Take 1 capsule by mouth once a day 16)  Lisinopril 5 Mg Tabs (Lisinopril) .... Take 1 tablet by mouth once a day 17)  Tylox 5-500 Mg Caps (Oxycodone-acetaminophen) .Marland Kitchen.. 1 tab four times a day 18)  Aspirin Ec 325 Mg Tbec (Aspirin) .... Take one tablet by mouth daily  Other Orders: Future Orders: Radiology Referral (Radiology) ...  01/25/2010  Patient Instructions: 1)  Please schedule a follow-up appointment in 3.90months. 2)  Tobacco is very bad for your health and your loved ones! You Should stop smoking!. 3)  Stop Smoking Tips: Choose a Quit date. Cut down before the Quit date. decide what you will do as a substitute when you feel the urge to smoke(gum,toothpick,exercise). 4)  It is important that you exercise regularly at least 20 minutes 5 times a week. If you develop chest pain, have severe difficulty breathing, or feel very tired , stop exercising immediately and seek medical attention. 5)  You need to lose weight. Consider a lower calorie diet and regular exercise.  6)  HbgA1C prior to visit, ICD-9: 7)  [place] to be schedul4d before you leave.

## 2010-09-17 NOTE — Miscellaneous (Signed)
Summary: med update  Clinical Lists Changes  Medications: Added new medication of ASPIRIN EC 325 MG TBEC (ASPIRIN) Take one tablet by mouth daily

## 2010-09-17 NOTE — Cardiovascular Report (Signed)
Summary: Office Visit Remote   Office Visit Remote   Imported By: Roderic Ovens 10/24/2009 10:03:54  _____________________________________________________________________  External Attachment:    Type:   Image     Comment:   External Document

## 2010-09-17 NOTE — Letter (Signed)
Summary: Remote Device Check  [employer], Main Office  [address]   [place], [place] [address]   Phone: [phone]  Fax: [phone]     October 22, 2009 MRN: [id number]   KJERSTIN ABRIGO [address] [place], [place]  [address]   Dear Ms. Pelley,   Your remote transmission was recieved and reviewed by your physician.  All diagnostics were within normal limits for you.   ___X___Your next office visit is scheduled for:  JUNE 2011 WITH DR Graciela Husbands. Please call our office to schedule an appointment.    Sincerely,  Proofreader

## 2010-09-17 NOTE — Progress Notes (Signed)
  Phone Note Call from Patient   Caller: Patient Summary of Call: knot on right wrist, growing and is painful   advised er or urgent care for eval  Initial call taken by: Adella Hare LPN,  March 08, 2010 1:32 PM

## 2010-09-17 NOTE — Letter (Signed)
Summary: DEMO  DEMO   Imported By: Lind Guest 05/10/2010 15:26:48  _____________________________________________________________________  External Attachment:    Type:   Image     Comment:   External Document

## 2010-09-17 NOTE — Progress Notes (Signed)
  Request from [facility] sent to [facility]. [facility] [place]  April 23, 2010 3:27 PM

## 2010-09-17 NOTE — Letter (Signed)
Summary: X RAYS  X RAYS   Imported By: Lind Guest 05/10/2010 15:30:15  _____________________________________________________________________  External Attachment:    Type:   Image     Comment:   External Document

## 2010-09-17 NOTE — Assessment & Plan Note (Signed)
Summary: [facility]  Medications Added NICODERM CQ 14 MG/24HR PT24 (NICOTINE) place one patch on skin and replace daily.  Use this dose for 6 weeks.   DO NOT SMOKE WHILE ON THE PATCH!!! NICODERM CQ 7 MG/24HR PT24 (NICOTINE) place one patch on skin and replace daily.  Use this dose for 2 weeks, after the higher dose.  DO NOT SMOKE WHILE ON THE PATCH!!!      Allergies Added: NKDA  Visit Type:  Follow-up Primary Provider:  Syliva Overman MD  CC:  Chest pains pt. went to ER. Bilateral leg nubness.  History of Present Illness: this is a 45 year old, African American female, patient with history of coronary artery disease, status post DMI, treated with stenting of the RCA in the past. She also has a history of ventricular fibrillation treated with a defibrillator.  The patient went to imipenem. Emergency room October 12, 2009 with chest pain. She describes it as a sharp, shooting pain into her her back and she, thinks it's related to the pizza. She ate at a party the night before. She had a CT scan that ruled out pulmonary embolus, and she was asked to stay for possible unstable angina. The patient ended up signing out AMA. She says it was nothing like when she had her MI and stent placed. She denied any chest heaviness, pressure, dyspnea, dyspnea on exertion, dizziness, or presyncope. The patient does not exercise because of bilateral leg numbness. She also continues to smoke half a pack of cigarettes a day. She was on chance to ask but had trouble taking this. She tried quitting cold [place] and this did not work either.  Current Medications (verified): 1)  Diclofenac Sodium 75 Mg  Tbec (Diclofenac Sodium) .Marland Kitchen.. 1 By Mouth Once Daily 2)  Hydroxychloroquine Sulfate 200 Mg  Tabs (Hydroxychloroquine Sulfate) .Marland Kitchen.. 1 By Mouth Two Times A Day 3)  Alprazolam 1 Mg  Tabs (Alprazolam) .Marland Kitchen.. 1 By Mouth At Bedtime 4)  Cellcept 500 Mg  Tabs (Mycophenolate Mofetil) .... Take 1 Tablet By Mouth Once A  Day 5)  Plavix 75 Mg  Tabs (Clopidogrel Bisulfate) .Marland Kitchen.. 1 By Mouth Once Daily 6)  Klor-Con 20 Meq  Pack (Potassium Chloride) .Marland Kitchen.. 1 By Mouth Once Daily 7)  Carvedilol 6.25 Mg Tabs (Carvedilol) .... Take 1 Tablet By Mouth Two Times A Day 8)  Lipitor 40 Mg  Tabs (Atorvastatin Calcium) .Marland Kitchen.. 1 By Mouth At Bedtime 9)  Spiriva Handihaler 18 Mcg  Caps (Tiotropium Bromide Monohydrate) .Marland Kitchen.. 1 Puff Once Daily 10)  Advair Diskus 100-50 Mcg/dose  Misc (Fluticasone-Salmeterol) .Marland Kitchen.. 1 Puff Two Times A Day 11)  Xopenex Hfa 45 Mcg/act Aero (Levalbuterol Tartrate) .... 2 Puffs Every 6-8 Hours Prn 12)  Prodigy Blood Glucose Monitor W/device Kit (Blood Glucose Monitoring Suppl) .... Uad Once Daily Testing 13)  Prodigy Blood Glucose Test  Strp (Glucose Blood) .... Once Daily Testing 14)  Prodigy Twist Top Lancets 28g  Misc (Lancets) .... Uad Once Daily Testing 15)  Omeprazole 20 Mg Cpdr (Omeprazole) .... Take 1 Capsule By Mouth Once A Day 16)  Lisinopril 5 Mg Tabs (Lisinopril) .... Take 1 Tablet By Mouth Once A Day 17)  Tylox 5-500 Mg Caps (Oxycodone-Acetaminophen) .Marland Kitchen.. 1 Tab Three Times A Day 18)  Nicoderm Cq 14 Mg/24hr Pt24 (Nicotine) .... Place One Patch On Skin and Replace Daily.  Use This Dose For 6 Weeks.   Do Not Smoke While On The Patch!!! 19)  Nicoderm Cq 7 Mg/24hr Pt24 (Nicotine) .... Place One  Patch On Skin and Replace Daily.  Use This Dose For 2 Weeks, After The Higher Dose.  Do Not Smoke While On The Patch!!!  Allergies (verified): No Known Drug Allergies  Past History:  Past Medical History: Last updated: 02/04/2009 Myocardial infarction, hx of Congestive heart failure, class 3 Coronary artery disease Previous implanted ICD with approrpriate and inappropriate therapy Hyperlipidemia Hypertension axillary adenopathy, bilat Anxiety Depression Diabetes mellitus, type II tobacco abuse insomnia migraines, hx systemic lupus with renal and skin complications GERD arthritis, knees with  avascular necrosis bronchitis, acute, hx vaginitis, hx  Review of Systems       see history of present illness  Vital Signs:  Patient profile:   45 year old female Menstrual status:  hysterectomy Height:      66 inches Weight:      219.50 pounds BMI:     35.56 Pulse rate:   86 / minute Pulse rhythm:   regular Resp:     18 per minute BP sitting:   110 / 80  (left arm) Cuff size:   large  Vitals Entered By: Vikki Ports (October 24, 2009 9:23 AM)  Physical Exam  General:   Well-nournished, in no acute distress. Neck: No JVD, HJR, Bruit, or thyroid enlargement Lungs: No tachypnea, clear without wheezing, rales, or rhonchi Cardiovascular: RRR, PMI not displaced, 2/6 systolic murmur at the left sternal border, no gallops, bruit, thrill, or heave. Abdomen: BS normal. Soft without organomegaly, masses, lesions or tenderness. Extremities: without cyanosis, clubbing or edema. Good distal pulses bilateral SKin: Warm, no lesions or rashes  Musculoskeletal: No deformities Neuro: no focal signs    EKG  Procedure date:  10/24/2009  Findings:      normal sinus rhythm, nonspecific ST-T wave changes. No acute change. QT control 495 ms   ICD Specifications Following MD:  Sherryl Manges, MD     ICD Vendor:  [employer]     ICD Model Number:  7232     ICD Serial Number:  [id number] ICD DOI:  01/02/2006     ICD Implanting MD:  Sherryl Manges, MD  Lead 1:    Location: RV     DOI: 01/02/2006     Model #: 4098     Serial #: [id number]     Status: active  Indications::  CARDIOMYOPATHY   ICD Follow Up ICD Dependent:  No      Brady Parameters Mode VVI     Lower Rate Limit:  40      Tachy Zones VF:  240     VT:  200     Impression & Recommendations:  Problem # 1:  CHEST PAIN UNSPECIFIED (ICD-786.50)  Patient had an episode of atypical chest pain, and sent her to imipenem or emergency room. CT was negative for pulmonary embolus. Because of her history of coronary artery disease,we will  check an adenosine Myoview. Her updated medication list for this problem includes:    Plavix 75 Mg Tabs (Clopidogrel bisulfate) .Marland Kitchen... 1 by mouth once daily    Carvedilol 6.25 Mg Tabs (Carvedilol) .Marland Kitchen... Take 1 tablet by mouth two times a day    Lisinopril 5 Mg Tabs (Lisinopril) .Marland Kitchen... Take 1 tablet by mouth once a day  Orders: Nuclear Stress Test (Nuc Stress Test)  Her updated medication list for this problem includes:    Plavix 75 Mg Tabs (Clopidogrel bisulfate) .Marland Kitchen... 1 by mouth once daily    Carvedilol 6.25 Mg Tabs (Carvedilol) .Marland Kitchen... Take 1 tablet by mouth two  times a day    Lisinopril 5 Mg Tabs (Lisinopril) .Marland Kitchen... Take 1 tablet by mouth once a day  Problem # 2:  CORONARY ARTERY DISEASE PRIOR STENT (ICD-414.00)  Patient has history of DMI, treated with stent to the RCA. She has not had a stress test in many years. We'll order an adenosine Myoview. Her updated medication list for this problem includes:    Plavix 75 Mg Tabs (Clopidogrel bisulfate) .Marland Kitchen... 1 by mouth once daily    Carvedilol 6.25 Mg Tabs (Carvedilol) .Marland Kitchen... Take 1 tablet by mouth two times a day    Lisinopril 5 Mg Tabs (Lisinopril) .Marland Kitchen... Take 1 tablet by mouth once a day  Orders: Nuclear Stress Test (Nuc Stress Test)  Her updated medication list for this problem includes:    Plavix 75 Mg Tabs (Clopidogrel bisulfate) .Marland Kitchen... 1 by mouth once daily    Carvedilol 6.25 Mg Tabs (Carvedilol) .Marland Kitchen... Take 1 tablet by mouth two times a day    Lisinopril 5 Mg Tabs (Lisinopril) .Marland Kitchen... Take 1 tablet by mouth once a day  Problem # 3:  IMPLANTABLE DEFIBRILLATOR MDT--6949 (ICD-V45.02) Patient has not had her device go off. This is stable  Problem # 4:  TOBACCO ABUSE (ICD-305.1) Smoking cessation was discussed. We will prescribe a nicotine patch. She is advised not to smoke cigarettes with the patch on.  Patient Instructions: 1)  Your physician has recommended you make the following change in your medication: add Nicotine Patch; for the  first 6 weeks use the higher dose, then the last 2 weeks use the lower dose. Do not smoke cigarettes while on the nicotine patch. 2)  Your physician has requested that you have an adenosine myoview.  For further information please visit [email or web address]  Please follow instruction sheet, as given. 3)  Your physician recommends that you schedule a follow-up appointment in: one month with Dr. Graciela Husbands. Prescriptions: NICODERM CQ 7 MG/24HR PT24 (NICOTINE) place one patch on skin and replace daily.  Use this dose for 2 weeks, after the higher dose.  DO NOT SMOKE WHILE ON THE PATCH!!!  #15 x 0   Entered by:   Minerva Areola, RN, BSN   Authorized by:   Marletta Lor, PA-C   Signed by:   Minerva Areola, RN, BSN on 10/24/2009   Method used:   Electronically to        [facility] (retail)       [address]       [place], [place]  [address]       Ph: [phone]       Fax: [phone]   RxID:   [id number] NICODERM CQ 14 MG/24HR PT24 (NICOTINE) place one patch on skin and replace daily.  Use this dose for 6 weeks.   DO NOT SMOKE WHILE ON THE PATCH!!!  #25 x 0   Entered by:   Minerva Areola, RN, BSN   Authorized by:   Marletta Lor, PA-C   Signed by:   Minerva Areola, RN, BSN on 10/24/2009   Method used:   Electronically to        [facility] (retail)       [address]       [place], [place]  [address]       Ph: [phone]       Fax: [phone]   RxID:   [id number]

## 2010-09-17 NOTE — Assessment & Plan Note (Signed)
Summary: 6 month rov 414.08 427.5  pfh,rn   Visit Type:  6 month follow up Primary Niaya Hickok:  Syliva Overman MD  CC:  headache.  History of Present Illness: Mrs. Minassian is seen in followup for coronary artery disease with prior inferior wall MI treated with stenting.    SHe is status post ICD implantation for primary prevention. She then had subsequent appropriate therapy for ventricular fibrillation.  The records also suggest that she has had inappropriate therapy.  Recent nuclear imaging demonstrated  ejection fraction of 30% with an infarct and no ischemia.  She ahs completed her associates degree and is working on Administrator, arts in business  Her older children hadve lost their jobs and they have moved back in with her(iwth their kids)    Current Medications (verified): 1)  Diclofenac Sodium 75 Mg  Tbec (Diclofenac Sodium) .Marland Kitchen.. 1 By Mouth Once Daily 2)  Hydroxychloroquine Sulfate 200 Mg  Tabs (Hydroxychloroquine Sulfate) .Marland Kitchen.. 1 By Mouth Two Times A Day 3)  Alprazolam 1 Mg  Tabs (Alprazolam) .Marland Kitchen.. 1 By Mouth At Bedtime 4)  Cellcept 500 Mg  Tabs (Mycophenolate Mofetil) .... Take 1 Tablet By Mouth Once A Day 5)  Plavix 75 Mg  Tabs (Clopidogrel Bisulfate) .Marland Kitchen.. 1 By Mouth Once Daily 6)  Klor-Con 20 Meq  Pack (Potassium Chloride) .Marland Kitchen.. 1 By Mouth Once Daily 7)  Carvedilol 6.25 Mg Tabs (Carvedilol) .... Take 1 Tablet By Mouth Two Times A Day 8)  Lipitor 40 Mg  Tabs (Atorvastatin Calcium) .Marland Kitchen.. 1 By Mouth At Bedtime 9)  Spiriva Handihaler 18 Mcg  Caps (Tiotropium Bromide Monohydrate) .Marland Kitchen.. 1 Puff Once Daily 10)  Advair Diskus 100-50 Mcg/dose  Misc (Fluticasone-Salmeterol) .Marland Kitchen.. 1 Puff Two Times A Day 11)  Xopenex Hfa 45 Mcg/act Aero (Levalbuterol Tartrate) .... 2 Puffs Every 6-8 Hours Prn 12)  Prodigy Blood Glucose Monitor W/device Kit (Blood Glucose Monitoring Suppl) .... Uad Once Daily Testing 13)  Prodigy Blood Glucose Test  Strp (Glucose Blood) .... Once Daily Testing 14)  Prodigy  Twist Top Lancets 28g  Misc (Lancets) .... Uad Once Daily Testing 15)  Omeprazole 20 Mg Cpdr (Omeprazole) .... Take 1 Capsule By Mouth Once A Day 16)  Lisinopril 5 Mg Tabs (Lisinopril) .... Take 1 Tablet By Mouth Once A Day 17)  Tylox 5-500 Mg Caps (Oxycodone-Acetaminophen) .Marland Kitchen.. 1 Tab Four Times A Day 18)  Aspirin Ec 325 Mg Tbec (Aspirin) .... Take One Tablet By Mouth Daily  Allergies (verified): No Known Drug Allergies  Past History:  Past Medical History: Last updated: 02/04/2009 Myocardial infarction, hx of Congestive heart failure, class 3 Coronary artery disease Previous implanted ICD with approrpriate and inappropriate therapy Hyperlipidemia Hypertension axillary adenopathy, bilat Anxiety Depression Diabetes mellitus, type II tobacco abuse insomnia migraines, hx systemic lupus with renal and skin complications GERD arthritis, knees with avascular necrosis bronchitis, acute, hx vaginitis, hx  Past Surgical History: Last updated: 08/31/2007 Cholecystectomy1990 Hysterectomy  with bil Oophorectomy-1990 Rotator cuff repair-2002 defibrillator placed-2007  Family History: Last updated: 11/08/2008 mother-56-HTN father-unknown sister x2           x1 deceased-AIDS brother x2 children x2-18, 20  Social History: Last updated: 08/31/2007 Disabled x4 years Single Current Smoker-<1 ppd Alcohol use-no Drug use-no Children x2  Vital Signs:  Patient profile:   45 year old female Menstrual status:  hysterectomy Height:      66 inches Weight:      214.25 pounds BMI:     34.71 Pulse rate:   84 /  minute BP sitting:   108 / 62  (left arm) Cuff size:   regular  Vitals Entered By: Caralee Ates CMA (June 06, 2010 11:44 AM)  Physical Exam  General:  The patient was alert and oriented in no acute distress. HEENT Normal.  Neck veins were flat, carotids were brisk.  Lungs were clear.  Heart sounds were regular without murmurs or gallops.  Abdomen was soft with  active bowel sounds. There is no clubbing cyanosis or edema. Skin Warm and dry     ICD Specifications Following MD:  Sherryl Manges, MD     ICD Vendor:  Medtronic     ICD Model Number:  7232     ICD Serial Number:  [id number] ICD DOI:  01/02/2006     ICD Implanting MD:  Sherryl Manges, MD  Lead 1:    Location: RV     DOI: 01/02/2006     Model #: [id number]     Serial #: [id number]     Status: active  Indications::  CARDIOMYOPATHY   ICD Follow Up Battery Voltage:  3.04 V     Charge Time:  9.35 seconds     Underlying rhythm:  SR ICD Dependent:  No       ICD Device Measurements Right Ventricle:  Amplitude: 12.6 mV, Impedance: 480 ohms, Threshold: 1.0 V at 0.2 msec Shock Impedance: 47/60 ohms   Episodes MS Episodes:  0     Shock:  0     ATP:  0     Nonsustained:  0     Atrial Therapies:  0 Ventricular Pacing:  <0.1%  Brady Parameters Mode VVI     Lower Rate Limit:  40      Tachy Zones VF:  240     VT:  200     Next Remote Date:  09/05/2010     Next Cardiology Appt Due:  05/19/2011 Tech Comments:  NORMAL DEVICE FUNCTION.  NO EPISODES SINCE LAST CHECK.  NO CHANGES MADE.  5621 LEAD STABLE. SIC 0 CARELINK TRANSMISSION 09-05-10 AND ROV IN 12 MTHS W/SK. Vella Kohler  June 06, 2010 12:33 PM  Impression & Recommendations:  Problem # 1:  [phone] LEAD (ICD-996.04) stable and risks reviewed  Problem # 2:  IMPLANTABLE DEFIBRILLATOR MDT--6949 (ICD-V45.02) Device parameters and data were reviewed and no changes were made  Problem # 3:  CORONARY ARTERY DISEASE PRIOR STENT (ICD-414.00) without chest pain; stable on current medications Her updated medication list for this problem includes:    Plavix 75 Mg Tabs (Clopidogrel bisulfate) .Marland Kitchen... 1 by mouth once daily    Carvedilol 6.25 Mg Tabs (Carvedilol) .Marland Kitchen... Take 1 tablet by mouth two times a day    Lisinopril 5 Mg Tabs (Lisinopril) .Marland Kitchen... Take 1 tablet by mouth once a day    Aspirin Ec 325 Mg Tbec (Aspirin) .Marland Kitchen... Take one tablet by mouth  daily  Problem # 4:  SYSTOLIC HEART FAILURE, CHRONIC EF 25% (ICD-428.22) stable on current meds Her updated medication list for this problem includes:    Plavix 75 Mg Tabs (Clopidogrel bisulfate) .Marland Kitchen... 1 by mouth once daily    Carvedilol 6.25 Mg Tabs (Carvedilol) .Marland Kitchen... Take 1 tablet by mouth two times a day    Lisinopril 5 Mg Tabs (Lisinopril) .Marland Kitchen... Take 1 tablet by mouth once a day    Aspirin Ec 325 Mg Tbec (Aspirin) .Marland Kitchen... Take one tablet by mouth daily  Problem # 5:  VENTRICULAR FIBRILLATION (ICD-427.41) no recurrent arrhythmia Her updated medication list  for this problem includes:    Plavix 75 Mg Tabs (Clopidogrel bisulfate) .Marland Kitchen... 1 by mouth once daily    Carvedilol 6.25 Mg Tabs (Carvedilol) .Marland Kitchen... Take 1 tablet by mouth two times a day    Lisinopril 5 Mg Tabs (Lisinopril) .Marland Kitchen... Take 1 tablet by mouth once a day    Aspirin Ec 325 Mg Tbec (Aspirin) .Marland Kitchen... Take one tablet by mouth daily

## 2010-09-17 NOTE — Letter (Signed)
Summary: PHONE NOTES  PHONE NOTES   Imported By: Lind Guest 05/10/2010 15:29:21  _____________________________________________________________________  External Attachment:    Type:   Image     Comment:   External Document

## 2010-09-17 NOTE — Letter (Signed)
Summary: MEDICAL RELEASE  MEDICAL RELEASE   Imported By: Lind Guest 05/10/2010 09:43:32  _____________________________________________________________________  External Attachment:    Type:   Image     Comment:   External Document

## 2010-09-17 NOTE — Progress Notes (Signed)
Summary: Nuclear Pre-Procedure  Phone Note Outgoing Call   Call placed by: Milana Na, EMT-P,  October 30, 2009 1:30 PM Summary of Call: Reviewed information on Myoview Information Sheet (see scanned document for further details). No Answer     Nuclear Med Background Indications for Stress Test: Evaluation for Ischemia, Post Hospital  Indications Comments: 10/12/09 [facility] ED with CP (Left AMA)  History: Abnormal EKG, Defibrillator, Myocardial Infarction, Stents  History Comments: 05/07 MI - DMI  STENTS RCA Devibrilator: VFib H/O CHF  Symptoms: Chest Pain    Nuclear Pre-Procedure Cardiac Risk Factors: Hypertension, Lipids, NIDDM, Smoker Height (in): 66  Nuclear Med Study Referring MD:  S.Klein

## 2010-09-17 NOTE — Letter (Signed)
Summary: Device-Delinquent Phone Journalist, newspaper, Main Office  [address]   [place], [place] [address]   Phone: [phone]  Fax: [phone]     September 21, 2009 MRN: [id number]   [name] [address] [place], [place]  [address]   Dear Ms. Heckert,  According to our records, you were scheduled for a device phone transmission on   August 28, 2009.     We did not receive any results from this check.  If you transmitted on your scheduled day, please call us to help troubleshoot your system.  If you forgot to send your transmission, please send one upon receipt of this letter.  Thank you,   Architectural technologist [facility]

## 2010-09-17 NOTE — Assessment & Plan Note (Signed)
Summary: 1 month rov/sl   Primary Provider:  Syliva Overman MD  CC:  1 month rov follow up on nuclear study.  History of Present Illness: this is a 45 year old, African American female, patient with history of coronary artery disease, status post DMI, treated with stenting of the RCA in the past.   SHe is status post ICD implantation for primary prevention. She then had subsequent appropriate therapy for ventricular fibrillation.    She recently went to the hospital because of chest pain. CT scanning was negative for pulmonary embolism. She did not stay in the hospital for a recommended evaluation for unstable angina and was seen in our office and underwent nuclear imaging. Her scan demonstrated ejection fraction of 30% with an infarct and no ischemia.    Current Medications (verified): 1)  Diclofenac Sodium 75 Mg  Tbec (Diclofenac Sodium) .Marland Kitchen.. 1 By Mouth Once Daily 2)  Hydroxychloroquine Sulfate 200 Mg  Tabs (Hydroxychloroquine Sulfate) .Marland Kitchen.. 1 By Mouth Two Times A Day 3)  Alprazolam 1 Mg  Tabs (Alprazolam) .Marland Kitchen.. 1 By Mouth At Bedtime 4)  Cellcept 500 Mg  Tabs (Mycophenolate Mofetil) .... Take 1 Tablet By Mouth Once A Day 5)  Plavix 75 Mg  Tabs (Clopidogrel Bisulfate) .Marland Kitchen.. 1 By Mouth Once Daily 6)  Klor-Con 20 Meq  Pack (Potassium Chloride) .Marland Kitchen.. 1 By Mouth Once Daily 7)  Carvedilol 6.25 Mg Tabs (Carvedilol) .... Take 1 Tablet By Mouth Two Times A Day 8)  Lipitor 40 Mg  Tabs (Atorvastatin Calcium) .Marland Kitchen.. 1 By Mouth At Bedtime 9)  Spiriva Handihaler 18 Mcg  Caps (Tiotropium Bromide Monohydrate) .Marland Kitchen.. 1 Puff Once Daily 10)  Advair Diskus 100-50 Mcg/dose  Misc (Fluticasone-Salmeterol) .Marland Kitchen.. 1 Puff Two Times A Day 11)  Xopenex Hfa 45 Mcg/act Aero (Levalbuterol Tartrate) .... 2 Puffs Every 6-8 Hours Prn 12)  Prodigy Blood Glucose Monitor W/device Kit (Blood Glucose Monitoring Suppl) .... Uad Once Daily Testing 13)  Prodigy Blood Glucose Test  Strp (Glucose Blood) .... Once Daily Testing 14)   Prodigy Twist Top Lancets 28g  Misc (Lancets) .... Uad Once Daily Testing 15)  Omeprazole 20 Mg Cpdr (Omeprazole) .... Take 1 Capsule By Mouth Once A Day 16)  Lisinopril 5 Mg Tabs (Lisinopril) .... Take 1 Tablet By Mouth Once A Day 17)  Tylox 5-500 Mg Caps (Oxycodone-Acetaminophen) .Marland Kitchen.. 1 Tab Four Times A Day 18)  Aspirin Ec 325 Mg Tbec (Aspirin) .... Take One Tablet By Mouth Daily  Allergies (verified): No Known Drug Allergies  Past History:  Past Medical History: Last updated: 02/04/2009 Myocardial infarction, hx of Congestive heart failure, class 3 Coronary artery disease Previous implanted ICD with approrpriate and inappropriate therapy Hyperlipidemia Hypertension axillary adenopathy, bilat Anxiety Depression Diabetes mellitus, type II tobacco abuse insomnia migraines, hx systemic lupus with renal and skin complications GERD arthritis, knees with avascular necrosis bronchitis, acute, hx vaginitis, hx  Past Surgical History: Last updated: 08/31/2007 Cholecystectomy1990 Hysterectomy  with bil Oophorectomy-1990 Rotator cuff repair-2002 defibrillator placed-2007  Family History: Last updated: 11/08/2008 mother-56-HTN father-unknown sister x2           x1 deceased-AIDS brother x2 children x2-18, 20  Social History: Last updated: 08/31/2007 Disabled x4 years Single Current Smoker-<1 ppd Alcohol use-no Drug use-no Children x2  Vital Signs:  Patient profile:   45 year old female Menstrual status:  hysterectomy Height:      66 inches Weight:      215 pounds BMI:     34.83 Pulse rate:   92 /  minute Pulse rhythm:   regular BP sitting:   105 / 70  (left arm) Cuff size:   large  Vitals Entered By: Judithe Modest CMA (November 30, 2009 10:38 AM)  Physical Exam  General:  The patient was alert and oriented in no acute distress. HEENT Normal.  Neck veins were flat, carotids were brisk.  Lungs were clear.  Heart sounds were regular without murmurs or  gallops.  Abdomen was soft with active bowel sounds. There is no clubbing cyanosis; Some puffiness of her feet Skin Warm and dry     ICD Specifications Following MD:  Sherryl Manges, MD     ICD Vendor:  Medtronic     ICD Model Number:  7232     ICD Serial Number:  [id number] ICD DOI:  01/02/2006     ICD Implanting MD:  Sherryl Manges, MD  Lead 1:    Location: RV     DOI: 01/02/2006     Model #: 4098     Serial #: [id number]     Status: active  Indications::  CARDIOMYOPATHY   ICD Follow Up Remote Check?  No Battery Voltage:  3.07 V     Charge Time:  9.19 seconds     Underlying rhythm:  ST ICD Dependent:  No       ICD Device Measurements Right Ventricle:  Amplitude: 9.9 mV, Impedance: 440 ohms, Threshold: 1.0 V at 0.4 msec Shock Impedance: 44/54 ohms   Episodes Shock:  0     ATP:  0     Nonsustained:  1     Ventricular Pacing:  <0.1%  Brady Parameters Mode VVI     Lower Rate Limit:  40      Tachy Zones VF:  240     VT:  200     Next Remote Date:  02/26/2010     Next Cardiology Appt Due:  11/18/2010 Tech Comments:  Normal device function.  [phone] lead stable.  No changes made today.  Updated letter and magnet given to patient.  Pt does [facility] tranmsissions.  ROV 12 months SK. Gypsy Balsam RN BSN  November 30, 2009 11:03 AM   Impression & Recommendations:  Problem # 1:  IMPLANTABLE DEFIBRILLATOR MDT--6949 (ICD-V45.02) Device parameters and data were reviewed and no changes were made  Problem # 2:  VENTRICULAR FIBRILLATION (ICD-427.41)  No recurrent ventricular arrhythmias Her updated medication list for this problem includes:    Plavix 75 Mg Tabs (Clopidogrel bisulfate) .Marland Kitchen... 1 by mouth once daily    Carvedilol 6.25 Mg Tabs (Carvedilol) .Marland Kitchen... Take 1 tablet by mouth two times a day    Lisinopril 5 Mg Tabs (Lisinopril) .Marland Kitchen... Take 1 tablet by mouth once a day    Aspirin Ec 325 Mg Tbec (Aspirin) .Marland Kitchen... Take one tablet by mouth daily  Problem # 3:  CORONARY ARTERY DISEASE PRIOR STENT  (ICD-414.00) her Myoview was reviewed. It reveals scar and no ischemia and depressed left ventricular function. I have been in contact with her rheumatologist to ask whether would be appropriate to add Aldactone  based on the emphasis trial I will wait to hear from her next week when she returns. Her updated medication list for this problem includes:    Plavix 75 Mg Tabs (Clopidogrel bisulfate) .Marland Kitchen... 1 by mouth once daily    Carvedilol 6.25 Mg Tabs (Carvedilol) .Marland Kitchen... Take 1 tablet by mouth two times a day    Lisinopril 5 Mg Tabs (Lisinopril) .Marland Kitchen... Take 1 tablet by mouth once  a day    Aspirin Ec 325 Mg Tbec (Aspirin) .Marland Kitchen... Take one tablet by mouth daily  Patient Instructions: 1)  You are scheduled for a device check from home on February 26, 2010. You may send your transmission at any time that day. If you have a wireless device, the transmission will be sent automatically. After your physician reviews your transmission, you will receive a postcard with your next transmission date. 2)  Your physician recommends that you continue on your current medications as directed. Please refer to the Current Medication list given to you today.

## 2010-09-17 NOTE — Miscellaneous (Signed)
Summary: refill  Clinical Lists Changes  Medications: Rx of ALPRAZOLAM 1 MG  TABS (ALPRAZOLAM) 1 by mouth at bedtime;  #30 x 2;  Signed;  Entered by: Worthy Keeler LPN;  Authorized by: Syliva Overman MD;  Method used: Printed then faxed to [facility]*, [address] [place], [place], [place]  [address], Ph: [phone], Fax: [phone]    Prescriptions: ALPRAZOLAM 1 MG  TABS (ALPRAZOLAM) 1 by mouth at bedtime  #30 x 2   Entered by:   Worthy Keeler LPN   Authorized by:   Syliva Overman MD   Signed by:   Worthy Keeler LPN on 62/95/2841   Method used:   Printed then faxed to ...       [facility]* (retail)       [address]       [place], [place]  [address]       Ph: [phone]       Fax: [phone]   RxID:   [id number]

## 2010-09-17 NOTE — Assessment & Plan Note (Signed)
Summary: vaginal irritation- room 2   Vital Signs:  Patient profile:   45 year old female Menstrual status:  hysterectomy Height:      66 inches Weight:      212.50 pounds BMI:     34.42 O2 Sat:      99 % on Room air Pulse rate:   87 / minute Resp:     16 per minute BP sitting:   110 / 70  (left arm)  Vitals Entered By: Adella Hare LPN (January 18, 1609 9:21 AM) CC: vaginal irritation Is Patient Diabetic? No Pain Assessment Patient in pain? no      Comments did not bring meds to ov   Primary Provider:  Syliva Overman MD  CC:  vaginal irritation.  History of Present Illness: Pt is here today concerned that she has a vaginal yeast infection.  Used an over the counter 3 day Monistat 5 days ago & no help.  She c/o clear dischg, odor, slight itching, and irritation.  She has not been sexually active x 3-4 mos.  Hx of hyst.    Allergies (verified): No Known Drug Allergies  Past History:  Past medical history reviewed for relevance to current acute and chronic problems.  Past Medical History: Reviewed history from 02/04/2009 and no changes required. Myocardial infarction, hx of Congestive heart failure, class 3 Coronary artery disease Previous implanted ICD with approrpriate and inappropriate therapy Hyperlipidemia Hypertension axillary adenopathy, bilat Anxiety Depression Diabetes mellitus, type II tobacco abuse insomnia migraines, hx systemic lupus with renal and skin complications GERD arthritis, knees with avascular necrosis bronchitis, acute, hx vaginitis, hx  Review of Systems GI:  Denies abdominal pain. GU:  Complains of discharge; denies dysuria and urinary frequency.  Physical Exam  General:  Well-developed,well-nourished,in no acute distress; alert,appropriate and cooperative throughout examination Head:  Normocephalic and atraumatic without obvious abnormalities. No apparent alopecia or balding. Genitalia:  Frothy yellowish dischg noted.  Cervix and uterus absent.  No pelvic tenderness.normal introitus, no external lesions, mucosa pink and moist, and no adnexal masses or tenderness.   Skin:  Intact without suspicious lesions or rashes Psych:  Cognition and judgment appear intact. Alert and cooperative with normal attention span and concentration. No apparent delusions, illusions, hallucinations   Impression & Recommendations:  Problem # 1:  VAGINITIS (ICD-616.10) Assessment New  Her updated medication list for this problem includes:    Metronidazole 500 Mg Tabs (Metronidazole) .Marland Kitchen... Take 1 two times a day for 7 days  Orders: T-Wet Prep by Molecular Probe [phone]) Chlamydia and GC Probe Amp, genital [phone]) T-Chlamydia & GC Probe, Genital ([phone])  Complete Medication List: 1)  Diclofenac Sodium 75 Mg Tbec (Diclofenac sodium) .Marland Kitchen.. 1 by mouth once daily 2)  Hydroxychloroquine Sulfate 200 Mg Tabs (Hydroxychloroquine sulfate) .Marland Kitchen.. 1 by mouth two times a day 3)  Alprazolam 1 Mg Tabs (Alprazolam) .Marland Kitchen.. 1 by mouth at bedtime 4)  Cellcept 500 Mg Tabs (Mycophenolate mofetil) .... Take 1 tablet by mouth once a day 5)  Plavix 75 Mg Tabs (Clopidogrel bisulfate) .Marland Kitchen.. 1 by mouth once daily 6)  Klor-con 20 Meq Pack (Potassium chloride) .Marland Kitchen.. 1 by mouth once daily 7)  Carvedilol 6.25 Mg Tabs (Carvedilol) .... Take 1 tablet by mouth two times a day 8)  Lipitor 40 Mg Tabs (Atorvastatin calcium) .Marland Kitchen.. 1 by mouth at bedtime 9)  Spiriva Handihaler 18 Mcg Caps (Tiotropium bromide monohydrate) .Marland Kitchen.. 1 puff once daily 10)  Advair Diskus 100-50 Mcg/dose Misc (Fluticasone-salmeterol) .Marland Kitchen.. 1 puff two  times a day 11)  Xopenex Hfa 45 Mcg/act Aero (Levalbuterol tartrate) .... 2 puffs every 6-8 hours prn 12)  Prodigy Blood Glucose Monitor W/device Kit (Blood glucose monitoring suppl) .... Uad once daily testing 13)  Prodigy Blood Glucose Test Strp (Glucose blood) .... Once daily testing 14)  Prodigy Twist Top Lancets 28g Misc  (Lancets) .... Uad once daily testing 15)  Omeprazole 20 Mg Cpdr (Omeprazole) .... Take 1 capsule by mouth once a day 16)  Lisinopril 5 Mg Tabs (Lisinopril) .... Take 1 tablet by mouth once a day 17)  Tylox 5-500 Mg Caps (Oxycodone-acetaminophen) .Marland Kitchen.. 1 tab four times a day 18)  Aspirin Ec 325 Mg Tbec (Aspirin) .... Take one tablet by mouth daily 19)  Metronidazole 500 Mg Tabs (Metronidazole) .... Take 1 two times a day for 7 days  Patient Instructions: 1)  Keep your next appt with Dr Lodema Hong. 2)  I have prescribed an antibiotic for your vaginal infection. Prescriptions: METRONIDAZOLE 500 MG TABS (METRONIDAZOLE) take 1 two times a day for 7 days  #14 x 0   Entered and Authorized by:   Esperanza Sheets PA   Signed by:   Esperanza Sheets PA on 01/18/2010   Method used:   Electronically to        [facility]* (retail)       [address]       [place], [place]  [address]       Ph: [phone]       Fax: [phone]   RxID:   [id number]

## 2010-09-17 NOTE — Cardiovascular Report (Signed)
Summary: Office Visit Remote   Office Visit Remote   Imported By: Roderic Ovens 04/10/2010 14:59:54  _____________________________________________________________________  External Attachment:    Type:   Image     Comment:   External Document

## 2010-09-17 NOTE — Letter (Signed)
Summary: CONSULTS  CONSULTS   Imported By: Lind Guest 05/10/2010 15:26:13  _____________________________________________________________________  External Attachment:    Type:   Image     Comment:   External Document

## 2010-09-17 NOTE — Letter (Signed)
Summary: Remote Device Check  [facility], Main Office  [address]   [place], [place] [address]   Phone: [phone]  Fax: [phone]     April 05, 2010 MRN: [id number]   PAILYNN VAHEY [address] [place], [place]  [address]   Dear Ms. Brandy,   Your remote transmission was recieved and reviewed by your physician.  All diagnostics were within normal limits for you.   __X____Your next office visit is scheduled for:   October 2011 with Dr Graciela Husbands.  Please call our office to schedule an appointment.    Sincerely,  Vella Kohler

## 2010-09-17 NOTE — Assessment & Plan Note (Signed)
Summary: Cardiology Nuclear Study  Nuclear Med Background Indications for Stress Test: Evaluation for Ischemia, Post Hospital  Indications Comments: 10/12/09 [facility] ED with CP (Left AMA)  History: Abnormal EKG, Defibrillator, Myocardial Infarction, Stents  History Comments: 05/07 MI - DMI  STENTS RCA Devibrilator: VFib H/O CHF  Symptoms: Chest Pain, Chest Tightness, DOE, Fatigue, Palpitations, SOB    Nuclear Pre-Procedure Cardiac Risk Factors: Hypertension, Lipids, NIDDM, Smoker Caffeine/Decaff Intake: none NPO After: 8:00 PM Lungs: clear IV 0.9% NS with Angio Cath: 20g     IV Site: (R) AC IV Started by: Stanton Kidney EMT-P Chest Size (in) 42     Cup Size D     Height (in): 66 Weight (lb): 217 BMI: 35.15  Nuclear Med Study 1 or 2 day study:  1 day     Stress Test Type:  Adenosine Reading MD:  Olga Millers, MD     Referring MD:  S.Klein Resting Radionuclide:  Technetium 2m Tetrofosmin     Resting Radionuclide Dose:  11.0 mCi  Stress Radionuclide:  Technetium 90m Tetrofosmin     Stress Radionuclide Dose:  33.0 mCi   Stress Protocol  Dose of Adenosine:  55.2 mg    Stress Test Technologist:  Milana Na EMT-P     Nuclear Technologist:  Domenic Polite CNMT  Rest Procedure  Myocardial perfusion imaging was performed at rest 45 minutes following the intravenous administration of Myoview Technetium 66m Tetrofosmin.  Stress Procedure  The patient received IV adenosine at 140 mcg/kg/min for 4 minutes. 2nd degree avb with infusion. There were no significant changes and chest tightness  with infusion. Myoview was injected at the 2 minute mark and quantitative spect images were obtained after a 45 minute delay.  QPS Raw Data Images:  There is no interference from other nuclear activity. Stress Images:  There is decreased uptake in the apex and inferior wall at the base and midventricular level. Rest Images:  There is decreased uptake in the apex and inferior wall at the base  and midventricular level. Subtraction (SDS):  There is a fixed defect that is most consistent with a previous infarction. Transient Ischemic Dilatation:  1.10  (Normal <1.22)  Lung/Heart Ratio:  .38  (Normal <0.45)  Quantitative Gated Spect Images QGS EDV:  202 ml QGS ESV:  142 ml QGS EF:  30 % QGS cine images:  Global hypokinesis and akinesis of the inferobasal wall; severe LVE.   Overall Impression  Exercise Capacity: Adenosine study with no exercise. ECG Impression: No significant ST segment change suggestive of ischemia. Overall Impression: Abnormal adenosine nuclear study with apical thinning and a prior inferior infarct; no ischemia.

## 2010-09-19 NOTE — Progress Notes (Signed)
Summary: look for chart  Phone Note Other Incoming   Caller: Phoebe Sharps Summary of Call: called and states they requested records back in Sept. they need Jan 99 to Beginning of 2004, I have looked back in the historic notes here and told him the 1st office visit I saw was 11/24/02.  I also gave him P.O.box address for Dr. Michaelle Copas office and I told him I would look in my office to see if I had an older chart on Mrs. Piggott.  I advised him I would try and go tomorrow to check in my office and call him back.  HIS # is [phone] Initial call taken by: Curtis Sites,  August 14, 2010 3:35 PM  Follow-up for Phone Call        Left msg for Reed Pandy stating I didn't have an additional chart on Mrs. Messamore. Follow-up by: Curtis Sites,  August 16, 2010 10:05 AM

## 2010-09-19 NOTE — Assessment & Plan Note (Signed)
Summary: OV   Vital Signs:  Patient profile:   45 year old female Menstrual status:  hysterectomy Height:      66 inches Weight:      213.75 pounds O2 Sat:      96 % on Room air Pulse rate:   91 / minute Pulse rhythm:   regular Resp:     16 per minute BP sitting:   100 / 70  (left arm)  Vitals Entered By: Adella Hare LPN (August 07, 2010 9:28 AM)  O2 Flow:  Room air CC: back and side pain, pressure after urination Is Patient Diabetic? No Comments did not bring meds to ov   Primary Care Provider:  Syliva Overman MD  CC:  back and side pain and pressure after urination.  History of Present Illness: Reports  that she has not been doing well.  Denies sinus pressure, nasal congestion , ear pain or sore throat. Denies chest congestion, or cough productive of sputum. Denies chest pain, palpitations, PND, orthopnea or leg swelling. Denies abdominal pain, nausea, vomitting, diarrhea or constipation. Denies change in bowel movements or bloody stool.  Denies  joint pain, swelling, or reduced mobility. Denies headaches, vertigo, seizures. Denies depression, anxiety or insomnia. Denies  rash, lesions, or itch.     Preventive Screening-Counseling & Management  Alcohol-Tobacco     Smoking Cessation Counseling: yes  Allergies (verified): No Known Drug Allergies  Review of Systems      See HPI General:  Complains of chills, fatigue, fever, and malaise. Eyes:  Denies blurring and discharge. GU:  Complains of dysuria and urinary frequency; right flank pain x 5 days. Endo:  Denies cold intolerance, excessive hunger, excessive thirst, excessive urination, and heat intolerance. Heme:  Denies abnormal bruising, bleeding, and enlarge lymph nodes. Allergy:  Complains of seasonal allergies.  Physical Exam  General:  Well-developed,well-nourished,in no acute distress; alert,appropriate and cooperative throughout examination HEENT: No facial asymmetry,  EOMI, No sinus  tenderness, TM's Clear, oropharynx  pink and moist.   Chest: Clear to auscultation bilaterally.  CVS: S1, S2, No murmurs, No S3.   Abd: Soft,right flank pain and tenderness. MS: Adequate ROM spine, hips, shoulders and knees.  Ext: No edema.   CNS: CN 2-12 intact, power tone and sensation normal throughout.   Skin: Intact, no visible lesions or rashes.  Psych: Good eye contact, normal affect.  Memory intact, not anxious or depressed appearing.    Impression & Recommendations:  Problem # 1:  PYELONEPHRITIS, ACUTE (ICD-590.10) Assessment Comment Only  Orders: T-CBC w/Diff ([address]) Radiology Referral (Radiology) Rocephin  250mg  (U9811) Admin of Therapeutic Inj  intramuscular or subcutaneous (91478)  Problem # 2:  ACUTE CYSTITIS (ICD-595.0) Assessment: Comment Only  Her updated medication list for this problem includes:    Ciprofloxacin Hcl 500 Mg Tabs (Ciprofloxacin hcl) .Marland Kitchen... Take 1 tablet by mouth two times a day  Orders: Medicare Electronic Prescription [phone]) Urinalysis (13086-57846) T-Culture, Urine (96[address]-28413)  Encouraged to push clear liquids, get enough rest, and take acetaminophen as needed. To be seen in 10 days if no improvement, sooner if worse.  Problem # 3:  DIABETES MELLITUS, TYPE II (ICD-250.00) Assessment: Comment Only  Her updated medication list for this problem includes:    Lisinopril 5 Mg Tabs (Lisinopril) .Marland Kitchen... Take 1 tablet by mouth once a day    Aspirin Ec 325 Mg Tbec (Aspirin) .Marland Kitchen... Take one tablet by mouth daily Pt advised to reduce carbohydrate intake, espescially sweets, and to start regular physical activity, at  least 30 minutes 5 days weekly, to enable weight loss, and reduce the risk of becoming diabetic   Orders: T- Hemoglobin A1C ([address])  Labs Reviewed: Creat: 0.68 (06/05/2009)    Reviewed HgBA1c results: 6.4 (01/24/2010)  6.3 (06/05/2009)  Problem # 4:  HYPERTENSION (ICD-401.9) Assessment: Unchanged  Her updated  medication list for this problem includes:    Carvedilol 6.25 Mg Tabs (Carvedilol) .Marland Kitchen... Take 1 tablet by mouth two times a day    Lisinopril 5 Mg Tabs (Lisinopril) .Marland Kitchen... Take 1 tablet by mouth once a day  Orders: T-Basic Metabolic Panel [phone])  BP today: 100/70 Prior BP: 108/62 (06/06/2010)  Labs Reviewed: K+: 4.3 (06/05/2009) Creat: : 0.68 (06/05/2009)   Chol: 149 (06/05/2009)   HDL: 31 (06/05/2009)   LDL: 103 (06/05/2009)   TG: 74 (06/05/2009)  Complete Medication List: 1)  Diclofenac Sodium 75 Mg Tbec (Diclofenac sodium) .Marland Kitchen.. 1 by mouth once daily 2)  Hydroxychloroquine Sulfate 200 Mg Tabs (Hydroxychloroquine sulfate) .Marland Kitchen.. 1 by mouth two times a day 3)  Alprazolam 1 Mg Tabs (Alprazolam) .Marland Kitchen.. 1 by mouth at bedtime 4)  Cellcept 500 Mg Tabs (Mycophenolate mofetil) .... Take 1 tablet by mouth once a day 5)  Plavix 75 Mg Tabs (Clopidogrel bisulfate) .Marland Kitchen.. 1 by mouth once daily 6)  Klor-con 20 Meq Pack (Potassium chloride) .Marland Kitchen.. 1 by mouth once daily 7)  Carvedilol 6.25 Mg Tabs (Carvedilol) .... Take 1 tablet by mouth two times a day 8)  Lipitor 40 Mg Tabs (Atorvastatin calcium) .Marland Kitchen.. 1 by mouth at bedtime 9)  Spiriva Handihaler 18 Mcg Caps (Tiotropium bromide monohydrate) .Marland Kitchen.. 1 puff once daily 10)  Advair Diskus 100-50 Mcg/dose Misc (Fluticasone-salmeterol) .Marland Kitchen.. 1 puff two times a day 11)  Xopenex Hfa 45 Mcg/act Aero (Levalbuterol tartrate) .... 2 puffs every 6-8 hours prn 12)  Prodigy Blood Glucose Monitor W/device Kit (Blood glucose monitoring suppl) .... Uad once daily testing 13)  Prodigy Blood Glucose Test Strp (Glucose blood) .... Once daily testing 14)  Prodigy Twist Top Lancets 28g Misc (Lancets) .... Uad once daily testing 15)  Omeprazole 20 Mg Cpdr (Omeprazole) .... Take 1 capsule by mouth once a day 16)  Lisinopril 5 Mg Tabs (Lisinopril) .... Take 1 tablet by mouth once a day 17)  Tylox 5-500 Mg Caps (Oxycodone-acetaminophen) .Marland Kitchen.. 1 tab four times a day 18)  Aspirin Ec  325 Mg Tbec (Aspirin) .... Take one tablet by mouth daily 19)  Ciprofloxacin Hcl 500 Mg Tabs (Ciprofloxacin hcl) .... Take 1 tablet by mouth two times a day 20)  Fluconazole 150 Mg Tabs (Fluconazole) .... Take 1 tablet by mouth once a day as needed for vaginal itch  Other Orders: Influenza Vaccine MCR (29562)  Patient Instructions: 1)  Follow up appointment in 5.44months 2)  You have a UTI you will get an injection in the office and meds are being sent to the pharmacy 3)  You need labs today 4)  CBC and diff stat, and chem 7 5)  Tobacco is very bad for your health and your loved ones! You Should stop smoking!. 6)  Stop Smoking Tips: Choose a Quit date. Cut down before the Quit date. decide what you will do as a substitute when you feel the urge to smoke(gum,toothpick,exercise). 7)  It is important that you exercise regularly at least 20 minutes 5 times a week. If you develop chest pain, have severe difficulty breathing, or feel very tired , stop exercising immediately and seek medical attention. 8)  You need to  lose weight. Consider a lower calorie diet and regular exercise.  Prescriptions: FLUCONAZOLE 150 MG TABS (FLUCONAZOLE) Take 1 tablet by mouth once a day as needed for vaginal itch  #3 x 0   Entered and Authorized by:   Syliva Overman MD   Signed by:   Syliva Overman MD on 08/07/2010   Method used:   Electronically to        [facility]* (retail)       [address]       [place], [place]  [address]       Ph: [phone]       Fax: [phone]   RxID:   [id number] CIPROFLOXACIN HCL 500 MG TABS (CIPROFLOXACIN HCL) Take 1 tablet by mouth two times a day  #20 x 0   Entered and Authorized by:   Syliva Overman MD   Signed by:   Syliva Overman MD on 08/07/2010   Method used:   Electronically to        [facility]* (retail)       [address] [place], [place]  [address]       Ph: [phone]        Fax: [phone]   RxID:   [id number]    Medication Administration  Injection # 1:    Medication: Rocephin  250mg     Diagnosis: PYELONEPHRITIS, ACUTE (ICD-590.10)    Route: IM    Site: RUOQ gluteus    Exp Date: 05/14    Lot #: [id number]    Mfr: NOVAPLUS    Comments: ROCEPHIN 500MG  GIVEN    Patient tolerated injection without complications    Given by: Adella Hare LPN (August 07, 2010 12:14 PM)  Orders Added: 1)  Est. Patient Level IV [06301] 2)  Medicare Electronic Prescription [G8553] 3)  T-Basic Metabolic Panel [80048-22910] 4)  T-CBC w/Diff [60109-32355] 5)  T- Hemoglobin A1C [83036-23375] 6)  Radiology Referral [Radiology] 7)  Influenza Vaccine MCR [00025] 8)  Rocephin  250mg  [J0696] 9)  Admin of Therapeutic Inj  intramuscular or subcutaneous [96372] 10)  Urinalysis [81003-65000] 11)  T-Culture, Urine [[id number]]   Immunizations Administered:  Influenza Vaccine # 1:    Vaccine Type: Fluvax MCR    Site: right deltoid    Mfr: GlaxoSmithKline    Dose: 0.5 ml    Route: IM    Given by: Adella Hare LPN    Exp. Date: 02/15/2011    Lot #: [id number]    VIS given: 03/12/10 version given August 07, 2010.   Immunizations Administered:  Influenza Vaccine # 1:    Vaccine Type: Fluvax MCR    Site: right deltoid    Mfr: GlaxoSmithKline    Dose: 0.5 ml    Route: IM    Given by: Adella Hare LPN    Exp. Date: 02/15/2011    Lot #: [id number]    VIS given: 03/12/10 version given August 07, 2010.     Medication Administration  Injection # 1:    Medication: Rocephin  250mg     Diagnosis: PYELONEPHRITIS, ACUTE (ICD-590.10)    Route: IM    Site: RUOQ gluteus    Exp Date: 05/14    Lot #: [id number]    Mfr: NOVAPLUS    Comments: ROCEPHIN 500MG  GIVEN    Patient tolerated injection without complications    Given by: Adella Hare LPN (August 07, 2010 12:14 PM)  Orders Added: 1)  Est. Patient Level IV [04540] 2)  Medicare Electronic Prescription  [G8553] 3)  T-Basic Metabolic Panel [80048-22910] 4)  T-CBC w/Diff [98119-14782] 5)  T- Hemoglobin A1C [83036-23375] 6)  Radiology Referral [Radiology] 7)  Influenza Vaccine MCR [00025] 8)  Rocephin  250mg  [J0696] 9)  Admin of Therapeutic Inj  intramuscular or subcutaneous [96372] 10)  Urinalysis [81003-65000] 11)  T-Culture, Urine [[id number]]   Laboratory Results   Urine Tests  Date/Time Received: August 07, 2010 12:15 PM  Date/Time Reported: August 07, 2010 12:15 PM   Routine Urinalysis   Color: yellow Appearance: Clear Glucose: negative   (Normal Range: Negative) Bilirubin: negative   (Normal Range: Negative) Ketone: trace (5)   (Normal Range: Negative) Spec. Gravity: 1.025   (Normal Range: 1.003-1.035) Blood: large   (Normal Range: Negative) pH: 5.5   (Normal Range: 5.0-8.0) Protein: 100   (Normal Range: Negative) Urobilinogen: 0.2   (Normal Range: 0-1) Nitrite: negative   (Normal Range: Negative) Leukocyte Esterace: large   (Normal Range: Negative)

## 2010-09-20 NOTE — Letter (Signed)
Summary: CONSULTS  CONSULTS   Imported By: Lind Guest 05/10/2010 15:25:25  _____________________________________________________________________  External Attachment:    Type:   Image     Comment:   External Document

## 2010-09-20 NOTE — Letter (Signed)
Summary: LABS  LABS   Imported By: Lind Guest 05/10/2010 15:27:50  _____________________________________________________________________  External Attachment:    Type:   Image     Comment:   External Document

## 2010-10-07 ENCOUNTER — Encounter (INDEPENDENT_AMBULATORY_CARE_PROVIDER_SITE_OTHER): Payer: Self-pay | Admitting: *Deleted

## 2010-10-15 NOTE — Cardiovascular Report (Signed)
Summary: Office Visit Remote   Office Visit Remote   Imported By: Roderic Ovens 10/11/2010 11:00:11  _____________________________________________________________________  External Attachment:    Type:   Image     Comment:   External Document

## 2010-10-15 NOTE — Letter (Signed)
Summary: Remote Device Check  [facility], Main Office  [address]   [place], [place] [address]   Phone: [phone]  Fax: [phone]     October 07, 2010 MRN: [id number]   [name] [address] [place], [place]  [address]   Dear Ms. Dudash,   Your remote transmission was recieved and reviewed by your physician.  All diagnostics were within normal limits for you.  __X___Your next transmission is scheduled for:  12-12-2010.  Please transmit at any time this day.  If you have a wireless device your transmission will be sent automatically.   Sincerely,  Vella Kohler

## 2010-11-06 LAB — BASIC METABOLIC PANEL
CO2: 26 mEq/L (ref 19–32)
Calcium: 8.5 mg/dL (ref 8.4–10.5)
Chloride: 109 mEq/L (ref 96–112)
Glucose, Bld: 141 mg/dL — ABNORMAL HIGH (ref 70–99)
Sodium: 140 mEq/L (ref 135–145)

## 2010-11-06 LAB — DIFFERENTIAL
Basophils Absolute: 0 10*3/uL (ref 0.0–0.1)
Basophils Relative: 1 % (ref 0–1)
Eosinophils Absolute: 0.1 10*3/uL (ref 0.0–0.7)
Eosinophils Relative: 2 % (ref 0–5)
Monocytes Absolute: 0.3 10*3/uL (ref 0.1–1.0)
Monocytes Relative: 4 % (ref 3–12)
Neutro Abs: 2.9 10*3/uL (ref 1.7–7.7)

## 2010-11-06 LAB — CBC
Hemoglobin: 12.3 g/dL (ref 12.0–15.0)
MCHC: 33.4 g/dL (ref 30.0–36.0)
MCV: 83.1 fL (ref 78.0–100.0)
RDW: 13.7 % (ref 11.5–15.5)

## 2010-11-25 LAB — COMPREHENSIVE METABOLIC PANEL
ALT: 30 U/L (ref 0–35)
AST: 29 U/L (ref 0–37)
Alkaline Phosphatase: 93 U/L (ref 39–117)
Calcium: 8.8 mg/dL (ref 8.4–10.5)
GFR calc Af Amer: >60 mL/min (ref >60)
Potassium: 3.4 mEq/L — ABNORMAL LOW (ref 3.5–5.1)
Sodium: 138 mEq/L (ref 135–145)
Total Protein: 7.4 g/dL (ref 6.0–8.3)

## 2010-11-25 LAB — CBC
Hemoglobin: 11.9 g/dL — ABNORMAL LOW (ref 12.0–15.0)
MCHC: 34.6 g/dL (ref 30.0–36.0)
RBC: 4.19 MIL/uL (ref 3.87–5.11)
RDW: 13.8 % (ref 11.5–15.5)

## 2010-11-25 LAB — DIFFERENTIAL
Basophils Relative: 1 % (ref 0–1)
Eosinophils Absolute: 0.1 10*3/uL (ref 0.0–0.7)
Eosinophils Relative: 2 % (ref 0–5)
Lymphs Abs: 2.6 10*3/uL (ref 0.7–4.0)
Monocytes Absolute: 0.3 10*3/uL (ref 0.1–1.0)
Monocytes Relative: 4 % (ref 3–12)

## 2010-11-25 LAB — URINALYSIS, ROUTINE W REFLEX MICROSCOPIC
Bilirubin Urine: NEGATIVE
Glucose, UA: NEGATIVE mg/dL
Ketones, ur: NEGATIVE mg/dL
Protein, ur: NEGATIVE mg/dL

## 2010-11-25 LAB — LIPASE, BLOOD: Lipase: 88 U/L — ABNORMAL HIGH (ref 11–59)

## 2010-11-25 LAB — URINE MICROSCOPIC-ADD ON

## 2010-11-28 LAB — POCT CARDIAC MARKERS
CKMB, poc: <1 ng/mL — ABNORMAL LOW (ref 1.0–8.0)
CKMB, poc: <1 ng/mL — ABNORMAL LOW (ref 1.0–8.0)
Myoglobin, poc: 44.4 ng/mL (ref 12–200)
Troponin i, poc: <0.05 ng/mL (ref 0.00–0.09)

## 2010-11-28 LAB — DIFFERENTIAL
Basophils Absolute: 0 10*3/uL (ref 0.0–0.1)
Eosinophils Relative: 2 % (ref 0–5)
Lymphocytes Relative: 37 % (ref 12–46)
Lymphs Abs: 2.4 10*3/uL (ref 0.7–4.0)
Monocytes Absolute: 0.2 10*3/uL (ref 0.1–1.0)

## 2010-11-28 LAB — BASIC METABOLIC PANEL
BUN: 5 mg/dL — ABNORMAL LOW (ref 6–23)
CO2: 23 mEq/L (ref 19–32)
CO2: 23 mEq/L (ref 19–32)
Calcium: 8.8 mg/dL (ref 8.4–10.5)
Chloride: 106 mEq/L (ref 96–112)
Chloride: 110 mEq/L (ref 96–112)
Creatinine, Ser: 0.61 mg/dL (ref 0.4–1.2)
Creatinine, Ser: 0.73 mg/dL (ref 0.4–1.2)
GFR calc non Af Amer: >60 mL/min (ref >60)
Glucose, Bld: 119 mg/dL — ABNORMAL HIGH (ref 70–99)
Glucose, Bld: 119 mg/dL — ABNORMAL HIGH (ref 70–99)
Potassium: 3.2 mEq/L — ABNORMAL LOW (ref 3.5–5.1)
Sodium: 138 mEq/L (ref 135–145)

## 2010-11-28 LAB — CBC
HCT: 36.5 % (ref 36.0–46.0)
Hemoglobin: 12.1 g/dL (ref 12.0–15.0)
Hemoglobin: 12.3 g/dL (ref 12.0–15.0)
MCHC: 33.7 g/dL (ref 30.0–36.0)
MCV: 83.6 fL (ref 78.0–100.0)
MCV: 83.4 fL (ref 78.0–100.0)
RBC: 4.37 MIL/uL (ref 3.87–5.11)
RDW: 14.1 % (ref 11.5–15.5)

## 2010-11-28 LAB — CARDIAC PANEL(CRET KIN+CKTOT+MB+TROPI): Total CK: 52 U/L (ref 7–177)

## 2010-12-12 ENCOUNTER — Encounter: Payer: Self-pay | Admitting: *Deleted

## 2010-12-15 ENCOUNTER — Encounter: Payer: Self-pay | Admitting: *Deleted

## 2010-12-31 NOTE — Assessment & Plan Note (Signed)
[facility]                         ELECTROPHYSIOLOGY OFFICE NOTE   [name], [name]                     MRN:          [id number]  DATE:11/02/2007                            DOB:          1966/08/04    [name] is seen following device implantation for primary  prevention in the setting of nonischemic heart disease in the context of  lupus.  She is doing quite well.  She has some hyperesthesia over the  device which is not abating.   Her medications are reviewed, are legion and are unchanged except for  the discontinuation of her prednisone, up titration of her Lipitor.   On examination her blood pressure is 122/81, her pulse was 90.  Her lungs were clear.  Heart sounds were regular.  Extremities were without edema.   Interrogation of her Medtronic ICD demonstrates an R-wave of 9.5 with  impedance of 440, threshold voltage of 0.2.  The high voltage impedance  was 44 ohms, variable to 3.14.  Her 6949 lead was stable.   Will plan to see her again in 1 year's time.  She will be followed  remotely in the interim.     Duke Salvia, MD, [facility]  Electronically Signed    SCK/MedQ  DD: 11/02/2007  DT: 11/02/2007  Job #: [id number]   cc:   Milus Mallick. Lodema Hong, M.D.
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
## 2010-12-31 NOTE — Assessment & Plan Note (Signed)
[facility]                         ELECTROPHYSIOLOGY OFFICE NOTE   [name], [name]                     MRN:          [id number]  DATE:02/22/2008                            DOB:          03/07/1966    [name] is seen in followup for pacemaker implanted for primary  prevention in the setting of ischemic cardiomyopathy with prior  stenting.  She has had no intercurrent shocks.   She also has lupus.   MEDICATIONS:  Her medication list is legion and includes aspirin,  hydrochlorothiazide, Coreg 25 b.i.d., potassium 20 mg, Tylox, Xanax,  CellCept 500 b.i.d., Plavix 75 mg, Wellbutrin 300 mg, Actonel, Lipitor,  and recently started on Chantix.   PHYSICAL EXAMINATION:  VITAL SIGNS:  Her blood pressure is 116/76 with a  pulse of 76.  LUNGS:  Clear.  HEART:  Sounds are regular.  EXTREMITIES:  Without edema.  She continues to have pain over her device  site.   Interrogation of her [employer] device demonstrates an R-wave of 10.6  with impedance of 456, a threshold of 0.5 to 0.1, and high-voltage  impedances were 45/56.  There was one episode in the ventricular  tachycardia zone that was sinus tachycardia based on electrograms and  onset, and the device was reprogrammed to decrease the legacy of  inappropriate therapy.   IMPRESSION:  1. Ischemic heart disease with      a.     Depressed left ventricular function.      b.     Prior right coronary artery stenting.      c.     Nonischemic component.  2. Lupus.  3. Status post implantable cardioverter-defibrillator for the above.  4. Long-term Plavix use (x5 years).  5. Inappropriate therapy for sinus tachycardia with device      reprogramming.   [name] is stable.  Her device was reprogrammed.  We will ask the  impressions of our interventional colleagues as to whether we  discontinue the Plavix.   We will see her again in 1 year's time.     Duke Salvia, MD, [facility]  Electronically Signed   SCK/MedQ  DD: 02/22/2008  DT: 02/23/2008  Job #: [id number]   cc:   Milus Mallick. Lodema Hong, M.D.
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
## 2011-01-03 NOTE — H&P (Signed)
NAMEJERRYE, [name]                        ACCOUNT NO.:  [id number]   MEDICAL RECORD NO.:  [id number]                   PATIENT TYPE:  INP   LOCATION:  2002                                 FACILITY:  [facility]   PHYSICIAN:  Rollene Rotunda, M.D.                DATE OF BIRTH:  [birth year]   DATE OF ADMISSION:  06/29/2003  DATE OF DISCHARGE:                                HISTORY & PHYSICAL   REASON FOR PRESENTATION:  Evaluate the patient for chest pain.   HISTORY OF PRESENT ILLNESS:  The patient is a pleasant 45 year old African-  American female with a history of dilated cardiomyopathy and coronary  disease status post stenting of her right coronary.  Her last  catheterization in February of 2004 demonstrated left main 25% stenosed.  Normal coronaries otherwise.  Patent right coronary artery stent.  The  patient did have an episode of chest pain, and was in the ER in June, but  left the hospital against medical advice.   The patient reports that she had been doing relatively well.  She did have  one episode of chest pain last Thursday.  This was brief.  However, today at  3:30 p.m. after eating at [facility], she went to her daughter's house.  She noticed substernal chest discomfort.  It was a sharp and heavy feeling.  She had a feeling like somebody was sitting on her.  She did have some left  arm numbness.  It was 6/10 in intensity.  She noticed that it is worse  taking a deep breath.  It is worse laying flat.  She did have some  diaphoresis and some lightheadedness.  She was nauseated, but had no  vomiting.  She does not really think this was similar to her previous  episodes.  In particularly, the pain with inspiration is new.  She presented  to the emergency room where she had no acute EKG changes.  She did have some  improvement of her pain with IV nitroglycerin, and further improvement with  morphine; however, she is still uncomfortable when she takes a deep breath.  She is  not having any radiation between her shoulder blades.  She has had  some coughing associated with cold symptoms recently.  She does not describe  any leg swelling or calf tenderness.   PAST MEDICAL HISTORY:  1. Lupus.  2. Hypertension.  3. Diabetes mellitus.  4. Coronary artery disease as described.  5. Cardiomyopathy with an ejection fraction of 25% (felt to be non-ischemic,     possibly related to lupus).   PAST SURGICAL HISTORY:  1. Hysterectomy.  2. Cholecystectomy.   ALLERGIES:  NO KNOWN DRUG ALLERGIES.Marland Kitchen   MEDICATION:  1. Tylox 5/500 b.i.d. p.r.n.  2. Coreg 25 mg b.i.d.  3. Lipitor 20 mg q.h.s.  4. Restoril.  5. Wellbutrin XL 150 mg daily.  6. Imipramine 25 mg q.h.s.  7. Xanax.  8. Plaquenil 20 mg b.i.d.  9. Plavix 75 mg daily.  10.      CellCept 1000 mg b.i.d.  11.      Protonix 40 mg daily.  12.      Prednisone 5 mg daily.   SOCIAL HISTORY:  The patient is single.  She has a boyfriend.  She has two  children.  She smokes 1-1/2 pack per day and has done so for 15 years.  She  is on disability.  She does not drink alcohol.   FAMILY HISTORY:  Noncontributory for early coronary artery disease.   REVIEW OF SYSTEMS:  As stated in the HPI; otherwise, negative for other  systems.   PHYSICAL EXAMINATION:  GENERAL:  The patient is in no distress.  VITAL SIGNS:  Blood pressure 113/71, heart rate 100 and regular.  HEENT:  Eyes unremarkable.  Pupils are equal, round and reactive to light.  Fundi not visualized.  NECK:  No jugular venous distention.  Waveform within normal limits.  Carotid upstroke brisk and symmetric.  No bruits.  No thyromegaly.  LYMPHATICS:  No cervical, axillary, or inguinal adenopathy.  LUNGS:  Clear to auscultation bilaterally.  BACK:  No costovertebral angle tenderness.  CHEST:  Unremarkable.  No tenderness to palpation.  HEART:  PMI not displaced or sustained.  S1 and S2 within normal limits.  No  S3, no S4, no murmurs.  Questionable soft three  component rub.  No systolic  murmurs.  No diastolic murmurs.  ABDOMEN:  Obese.  Positive bowel sounds.  Normal frequency and pitch.  No  bruits, rebound, guarding in her midline.  No masses, hepatomegaly, or  splenomegaly.  SKIN:  Malar rash.  No nodules.  EXTREMITIES:  With 2+ pulses throughout.  No edema, no cyanosis, no  clubbing, no calf tenderness or swelling.  NEUROLOGIC:  Oriented to person, place, and time.  Cranial nerves II-XII  grossly intact.  Motor grossly intact.   EKG revealed sinus tachycardia, rate 160.  Left ventricular hypertrophy by  voltage criteria.  Repolarization changes in the lateral leads.   Chest x-ray revealed cardiomegaly, no acute disease.   ASSESSMENT AND PLAN:  1. Chest discomfort.  The patient's chest discomfort is atypical for angina.     I do have some suspicion of pulmonary emboli though the probability is     low.  She is at risk for this.  I think this needs to be ruled out with a     spiral CT.  She has no overt clinical evidence of pericarditis, but again     she is at risk for this, and has symptoms consistent with this.  I do not     suspect a large effusion.  I do think it is safe to heparinize her for     the possibility of #1 above.  Will get an echocardiogram tomorrow to look     for effusion or other evidence of pericarditis.  This will be treated     symptomatically.  The strong possibility is musculoskeletal chest pain,     as she has had some coughing.  This will again be treated     symptomatically.  She will be ruled out for myocardial infarction.  Since     IV nitroglycerin has eased the pain, she will continue on this.  At this     point, I do not think cardiac catheterization is indicated.  2. Coronary disease as above.  3. Cardiomyopathy.  The patient was  recently taken off ACE inhibitor and     started on Coreg.  I will continue her outpatient regimen.  4.    Lupus.  She will continue her previous regimen of outpatient  medications.  5. Diabetes mellitus.  She will have sliding scale insulin if necessary.  6. Tobacco abuse.  Will continue to educate her about smoking.                                                Rollene Rotunda, M.D.    JH/MEDQ  D:  06/29/2003  T:  06/30/2003  Job:  [id number]   cc:   Milus Mallick. Lodema Hong, M.D.  [address]  [place], [place] [address]  Fax: [phone]   Vida Roller, M.D.  Fax: [phone]   [facility] [place]

## 2011-01-03 NOTE — Cardiovascular Report (Signed)
[name], [name]                        ACCOUNT NO.:  [id number]   MEDICAL RECORD NO.:  [id number]                   PATIENT TYPE:  OIB   LOCATION:  2899                                 FACILITY:  [facility]   PHYSICIAN:  Vida Roller, M.D.                DATE OF BIRTH:  1966/04/26   DATE OF PROCEDURE:  DATE OF DISCHARGE:                              CARDIAC CATHETERIZATION   PROCEDURES PERFORMED:  1. Left heart catheterization.  2. Selective coronary angiography.  3. Distal aortography.   HISTORY OF PRESENT ILLNESS:  The patient is a 45 year old African-American  female with systemic lupus erythematosus, diabetes, hypertension who  presented to the [facility] Emergency Department complaining of substernal  chest pain.  She has a history of acute myocardial infarction in the right  coronary artery as well as severe nonischemic dilated cardiomyopathy thought  to be secondary to her lupus, hypertension and diabetes.  She had an  angioplasty of her distal right coronary artery with a 3.5 x 18 Zeta stent  placed in 2/03 who presents again with chest discomfort.  She was evaluated  as an inpatient with a myocardial perfusion imaging scan which documented  peri-infarction ischemia around the inferior wall and she was referred for  heart catheterization after medical management.   DETAILS OF THE PROCEDURE:  A 6-French sheath was placed into the right  femoral artery via the Seldinger technique.  Diagnostic catheterization was  performed using standard Judkins 6-French catheters  #4 right and left as  well as an angled pigtail catheter.  Left ventriculography was performed as  well as aortography.   RESULTS:  Hemodynamics:  Left ventricular pressure of 121/4 with an end-  diastolic pressure of 12, aortic pressure of 123/75 with a mean arterial  pressure of 88.  Distal aortography reveals widely patent abdominal  aorta  with normal appearing renal arteries and normal appearing  distal runoff.   CORONARY ANGIOGRAPHY:  1. Left main has a distal 25% stenosis.  Normal sized artery.  2. The left anterior descending coronary artery is a normal sized artery     with luminal irregularities.  There is a single small diagonal branch.  3. The ramus intermedius artery is a large artery which bifurcates and has     no significant disease.  4. The left circumflex is a co-dominant vessel, but relatively small.  It     gives off a very large branching ramus artery as previously described and     there is a distal circumflex that gives rise to a small first obtuse     marginal.  There is no significant disease in the circumflex coronary     artery.  5. The right coronary artery is a co-dominant vessel which is relatively     large.  There is an obvious stent in the mid portion of the artery which     is widely patent with  no evidence of end-stent restenosis.  6. The posterior descending coronary artery is small with no significant     disease.   Left ventriculography reveals global hypokinesis with an ejection fraction  of about 25% and a large dilated ventricle.  There is 2+ mitral  regurgitation seen.   ASSESSMENT:  1. Nonobstructive coronary disease with no evidence of end-stent restenosis.  2. Severely depressed left ventricular ejection fraction with global     hypokinesis consistent with a multifactorial cardiomyopathy.  3. Mitral regurgitation secondary to  cardiomyopathy.  4. Widely patent renal arteries.   PLAN:  Manage the patient medically.  Aggressively treat her lupus  erythematosus and diabetes, hypertension and cholesterol.                                                 Vida Roller, M.D.    JH/MEDQ  D:  10/13/2002  T:  10/13/2002  Job:  [id number]

## 2011-01-03 NOTE — Op Note (Signed)
[name], [name]              ACCOUNT NO.:  [id number]   MEDICAL RECORD NO.:  [id number]          PATIENT TYPE:  INP   LOCATION:  3731                         FACILITY:  [facility]   PHYSICIAN:  Duke Salvia, M.D.  DATE OF BIRTH:  [birth year]   DATE OF PROCEDURE:  01/02/2006  DATE OF DISCHARGE:  01/03/2006                                 OPERATIVE REPORT   PREOPERATIVE DIAGNOSIS:  Cardiomyopathy.   POSTOPERATIVE DIAGNOSIS:  Cardiomyopathy.   PROCEDURE:  Single-chamber defibrillator implantation with intraoperative  defibrillation threshold testing.   Following obtaining informed consent, the patient was brought to the  electrophysiology laboratory and placed on the fluoroscopic table in the  supine position.  After routine prep and drape, lidocaine was infiltrated in  the prepectoral subclavicular region.  An incision was made and carried down  to the layer of the prepectoral fascia using electrocautery and sharp  dissection.  A pocket was formed similarly.  Hemostasis was obtained.   Thereafter attention was turned toward gaining access to the extra-thoracic  subclavian vein, which was accomplished without difficulty and without the  aspiration of air or puncture of the artery.  A single venepuncture was  accomplished.  A guidewire was placed and retained.  Then a #7 French sheath  was placed, through which was then passed a Medtronic 6949 65 cm active-  fixation ventricular lead, serial number [id number].  Under fluoroscopic  guidance, it was admitted through the right ventricular apex where the  bipolar R wave was 22 mV with a pacing impedance of 1160 ohms, threshold of  0.6 volts at 0.5 milliseconds with recurrent threshold of 0.7 mA, and there  was no diaphragmatic pacing at 10 volts.   This lead was secured to the prepectoral fascia and then attached to a  Medtronic Maximo 7232 CX ICD, serial number [id number].  Through the device  a bipolar R wave was 8.7 mV with a  pacing impedance of 760 ohms, a threshold  of 1 volt at 1.2 milliseconds.  Proximal coil impedance was 48, distal coil  impedance was 41 ohms.   With these acceptable parameters recorded, defibrillation testing was  undertaken.  Ventricular fibrillation was induced via a T wave shock.  After  a total duration of 6 seconds, a 15 joule shock was delivered through a  measured resistance of 39 ohms, failing to terminate ventricular  fibrillation.  After a total duration of 12 seconds, a 25 joule shock was  delivered through a measured resistance of 40 ohms terminating ventricular  fibrillation, restoring sinus rhythm.   After a wait of 5-6 minutes, ventricular fibrillation was reintroduced via a  T wave shock, after a total duration of 8 seconds, a 25 joule shock was  delivered through a measured resistance of 40 ohms, terminating ventricular  fibrillation, restoring sinus rhythm.   It was noted that on the initial induction at a sensitivity of 1.2 mV, there  were 2 dropped sensitive events.  A second induction was undertaken at 0.9  mV sensitivity and there were no dropped sensitive events.   At this point, the device was implanted.  The pocket was copiously irrigated  with antibiotic containing saline solution.  Hemostasis was assured.  Leads  and the pulse generator were placed in the pocket secured to the prepectoral  fascia.  The wound was then closed in 3 layers in the normal fashion.  The  wound was washed, dried and  a Benzoin/Steri-Strips dressing was applied.  Needle counts, sponge counts,  and instrument counts were correct at the end of the procedure according to  the staff.  The patient tolerated the procedure without apparent  complication.   FLUOROSCOPY TIME:  0.57 minutes.           ______________________________  Duke Salvia, M.D.     SCK/MEDQ  D:  01/02/2006  T:  01/03/2006  Job:  [id number]   cc:   Electrophysiology Laboratory    [facility]

## 2011-01-03 NOTE — H&P (Signed)
[name], [name]                        ACCOUNT NO.:  [id number]   MEDICAL RECORD NO.:  [id number]                   PATIENT TYPE:  EMS   LOCATION:  ED                                   FACILITY:  [facility]   PHYSICIAN:  Gracelyn Nurse, M.D.              DATE OF BIRTH:  May 18, 1966   DATE OF ADMISSION:  02/09/2003  DATE OF DISCHARGE:  02/10/2003                                HISTORY & PHYSICAL   CHIEF COMPLAINT:  Chest pain.   HISTORY OF PRESENT ILLNESS:  This is a 45 year old black female with known  history of coronary artery disease.  She has had two cardiac  catheterizations with stents placed the last time.  She said her chest pain  started this morning, was in the substernal area, radiating to her back.  It  did not radiate to her left arm or her jaw.  She was mildly short of breath.  She had no nausea and vomiting.  She said the pain was similar to when she  had her first MI, but she thinks it is just gas.   PAST MEDICAL HISTORY:  1. Coronary artery disease.     A. Status post catheterization x2 with stents.  2. Lupus.  3. Non-insulin-dependent diabetes.  4. Hypertension.  5. Status post lymph node biopsy which was benign.  6. Status post cholecystectomy.  7. Status post hysterectomy.  8. Status post rotator cuff repair.   ALLERGIES:  No known drug allergies.   CURRENT MEDICATIONS:  1. Lipitor 20 mg q.h.s.  2. Lopressor 25 mg b.i.d.  3. Plavix 75 mg daily.  4. Plaquenil 200 mg b.i.d.  5. Altace 10 mg daily.  6. Prednisone 15 mg daily.  7. Protonix 40 mg daily.  8. Aspirin 325 mg daily.  9. Ambien 10 mg q.h.s.  10.      CellCept 500 mg b.i.d.  11.      Xanax 0.5 mg three times daily.  12.      Chloroquine 100 mg daily.   SOCIAL HISTORY:  She smokes about one-half pack of cigarettes a day, does  not drink any alcohol.  She is single, with two children.   FAMILY HISTORY:  Mother and father have no health problems.  She has an  uncle who has coronary  artery disease.   REVIEW OF SYSTEMS:  As per HPI.  Remainder of systems negative.   PHYSICAL EXAMINATION:  VITAL SIGNS:  Temperature is 98.5, pulse 75,  respirations 20, blood pressure 135/88.  GENERAL:  This is a well-nourished black female in no acute distress.  HEENT:  Pupils are equal, round, and reactive to light.  Extraocular  movements intact.  Oral mucosa is moist.  Oropharynx is clear.  CARDIOVASCULAR:  Regular rate and rhythm.  No murmurs.  LUNGS:  Clear to auscultation.  ABDOMEN:  Soft, nontender, nondistended.  Bowel sounds are positive.  EXTREMITIES:  No edema.  NEUROLOGIC:  Cranial nerves II-XII grossly intact.  No focal deficits.  SKIN:  Moist.  There is a malar-type rash on her face.   ADMITTING LABORATORIES:  White blood count 3.4, hemoglobin 10.8, platelets  253.  Sodium 141, potassium 3.6, chloride 112, CO2 26, BUN 8, creatinine  0.7.  CK 48, MB fraction 0.8, troponin I is 0.02.   EKG shows flipped T-waves in the lateral leads.   ASSESSMENT AND PLAN:  1. Chest pain:  The patient does have a history of coronary artery disease.     Will admit for rule out.  Start her on some anticoagulation.  Continue     her beta blocker and aspirin.  2. Non-insulin-dependent:  Will continue her current medications and add     sliding scale insulin.  3. Hypertension:  She is on medication for this, and will continue this.   Of note, after the patient was worked up she expressed a desire to go home,  saying she felt better and was pain-free.  I told her that she needed to be  admitted to the hospital so we could rule her out and evaluate her for  myocardial infarction.  She insisted on going home.  I explained the risks  including death.  She clearly understood this and still desired to go home,  so she is going to sign out against medical advice.  I did convince her to  follow up tomorrow morning with Dr. Dorethea Clan at [facility], whom she  sees here in [place].  Also, I  advised her if she has any return of chest  pain to return to the emergency room immediately.                                               Gracelyn Nurse, M.D.    JDJ/MEDQ  D:  02/10/2003  T:  02/10/2003  Job:  [id number]

## 2011-01-03 NOTE — Cardiovascular Report (Signed)
NAMETAMECCA, ARTIGA              ACCOUNT NO.:  [id number]   MEDICAL RECORD NO.:  [id number]          PATIENT TYPE:  INP   LOCATION:  2035                         FACILITY:  [facility]   PHYSICIAN:  Arvilla Meres, M.D. [facility] OF BIRTH:  [birth year]   DATE OF PROCEDURE:  11/05/2005  DATE OF DISCHARGE:                              CARDIAC CATHETERIZATION   PRIMARY CARE PHYSICIAN:  Dr. Lodema Hong.   CARDIOLOGIST:  Dr. Dionicio Stall in the [place] office.   PATIENT IDENTIFICATION:  Ms. Eimer is a very pleasant 45 year old woman  with history of lupus. She also has known coronary disease and is status  post stenting of her right coronary artery. Two nights ago, she woke up from  sleep with severe chest pain and diaphoresis. The pain persisted. She was  taken to [facility] Room. While in [facility], she  subsequently ruled in for subendocardial myocardial infarction with a  troponin of about 1.8. She underwent spiral CT which did not show any  evidence of pulmonary embolus. She was thus transferred here for cardiac  catheterization.   PROCEDURES:  1.  Selective coronary angiography.  2.  Left heart cath.  3.  Left ventriculogram.  4.  AngioSeal femoral artery closure.   DESCRIPTION OF PROCEDURE:  Risks and benefits of the procedure were  explained to Ms. Corsello; consent was signed and placed on the chart. A 6-  [place] arterial sheath was placed in right femoral artery using a modified  Seldinger technique. Standard preformed Judkins catheters including JL-4, JR-  4 and an angled pigtail were used for the procedure. All catheter exchanges  were made over wire. At the end of the procedure, the patient had her right  femoral arteriotomy site closed using AngioSeal closure device. There was  good hemostasis. The patient was transferred to holding area in stable  condition. There were no apparent complications.   Central aortic pressure 101/69 with mean of 83. LV  pressure was 99/55 with  LVEDP 11. There was no aortic stenosis.   Of note, the patient did receive 0.5 milligrams of atropine for some  bradycardia with heart rates in the 40s.   Left main was normal.   LAD was a long vessel coursing to the apex. It gave off a single diagonal.  In the distal vessel, there was evidence of a possible small intramyocardial  muscle bridge. There was no angiographic CAD.   Left circumflex was a large codominant vessel. It gave off a large ramus as  well as a large OM-1 and several posterolaterals. There was no angiographic  CAD.   Right coronary artery was a large dominant vessel that terminated in the  large branching PDA. There was also a right ventricular branch. There was a  tubular 40% lesion in the mid-section prior to takeoff of the RV branch.  After the RV branch, there was evidence of a previously placed stent which  was widely patent. Just after the stent around the crux, there was a 30%  lesion. In the proximal PDA, there was a 40% tubular stenosis.   Left ventriculogram done in  the RAO approach showed an EF of 20% with  inferior akinesis.   ASSESSMENT:  1.  Moderate nonobstructive coronary artery disease as described above.  2.  No obvious culprit lesion.  3.  Severe left ventricular dysfunction.   PLAN/DISCUSSION:  At this point, the etiology of her non-ST elevation  myocardial infarction/troponin bump is unclear. I wonder whether this could  be due to vasospasm though there is no evidence of this on catheterization.  At this time, we will proceed with medical treatment for coronary artery  disease and CHF. We will add Imdur for any possible vasospastic component.  She will likely need an ICD.      Arvilla Meres, M.D. [facility]  Electronically Signed     DB/MEDQ  D:  11/05/2005  T:  11/06/2005  Job:  [id number]   cc:   Lodema Hong, M.D.   Vida Roller, M.D.  Fax: [phone]

## 2011-01-03 NOTE — Discharge Summary (Signed)
   [name], [name]                        ACCOUNT NO.:  [id number]   MEDICAL RECORD NO.:  [id number]                   PATIENT TYPE:  INP   LOCATION:  3727                                 FACILITY:  [facility]   PHYSICIAN:  Gene Serpe, P.A. [facility]                DATE OF BIRTH:  [birth year]   DATE OF ADMISSION:  10/06/2002  DATE OF DISCHARGE:  10/09/2002                           DISCHARGE SUMMARY - REFERRING   There was no dictation for this job.  Just added CC.                                               Gene Serpe, P.A. [facility]    GS/MEDQ  D:  10/09/2002  T:  10/09/2002  Job:  [id number]   cc:   Milus Mallick. Lodema Hong, M.D.  [phone]  [place]  [place] [address]  Fax: [phone]

## 2011-01-03 NOTE — H&P (Signed)
[name], [name]              ACCOUNT NO.:  [id number]   MEDICAL RECORD NO.:  [id number]          PATIENT TYPE:  OBV   LOCATION:  A201                          FACILITY:  [facility]   PHYSICIAN:  Osvaldo Shipper, MD     DATE OF BIRTH:  1966/03/28   DATE OF ADMISSION:  11/04/2005  DATE OF DISCHARGE:  LH                                HISTORY & PHYSICAL   PRIMARY CARE PHYSICIAN:  Milus Mallick. Lodema Hong, M.D.   ADMISSION DIAGNOSES:  1.  Chest pain, rule out acute coronary syndrome.  2.  History of system lupus erythematosis (SLE).  3.  History of hypertension.  4.  History of coronary artery disease.   CHIEF COMPLAINT:  Chest pain.   HISTORY OF PRESENT ILLNESS:  The patient is a 44 year old African-American  female with a past medical history of coronary artery disease, hypertension,  SLE, whose last catheterization was back in 2004, who was doing well when  she woke up this morning with shortness of breath and retrosternal chest  pain.  The pain was described as somebody sitting her chest.  The pain was  10/10 in intensity.  She found it difficult to breathe at that time. She  took 2 nitroglycerin with no effect.  She called her daughter and sister who  rushed to take her to the hospital. For some reason, she did not call EMS.  En route, the patient took another nitroglycerin again with no effect. In  the ED, she was given 2 more nitroglycerin, and with the fifth one, she had  a little bit of relief of pain.  The pain was in the retrosternal area  radiating to her shoulders and her neck.  There was some history of nausea  but no emesis.  No history of palpitations or diaphoresis.  No history of  any dizziness or any syncopal episodes.   In the ED, the patient was given a GI cocktail with which she had some  improvement in her symptoms.  The patient's pain lasted about 3-1/2 hours,  and currently she is pain free.   MEDICATIONS AT HOME:  Again, we do not have a complete list,  unfortunately.  However, the patient states she is on the following.  1.  Coreg 25 mg twice daily.  2.  Nitroglycerin p.r.n.  3.  Potassium chloride 20 mEq a day.  4.  Prednisone 5 mg once a day.  5.  Lipitor 40 mg at bedtime.  6.  Voltaren 75 mg once a day.  7.  Protonix 40 mg once a day.  8.  Plavix 75 mg once a day.  9.  Wellbutrin p.r.n.  10. Hydrochlorothiazide, unknown dose, daily.  11. She also takes CellCept 1000 mg twice daily.  12. Plaquenil 200 mg twice daily.  13. She also applies some cream for her skin lesions.   ALLERGIES:  No known drug allergies.   PAST MEDICAL HISTORY:  1.  Significant for history of coronary artery disease with stent placement      back in 2003 to the RCA.  She had a catheterization back in February  2004 which showed nonobstructive coronary artery disease, showed mitral      regurgitation.  EF was severely depressed at 25%, and left ventricle was      dilated.  The patient had another angiogram in November 2004 when she      again presented with chest pain, and that time she was found to have 1-      vessel coronary artery disease with a 95% stenosis in the ramus      intermedius.  The patient was recommended to have medical management at      that time.  Since then, the patient has done well.  She does have some      episodes of chest pain now and then which are handled easily with      nitroglycerin.  However, today was different, and she was concerned      because the pain was not relieved with nitroglycerin.  2.  She has had SLE diagnosed back in 2000.  3.  History of diabetes in the past.  She is not on any medication      currently.  4.  History of hypertension.  5.  Dyslipidemia.   No history of strokes or lung disease.   PAST SURGICAL HISTORY:  1.  Hysterectomy.  2.  Cholecystectomy.  3.  Biopsy for left arm lesion.   SOCIAL HISTORY:  The patient is unemployed, lives with her family.  Uses a  cane at times to ambulate.   Smokes 1/2 pack of cigarettes per day which she  started doing recently.  Alcohol: Occasional use.  No illicit drug use.   FAMILY HISTORY:  Noncontributory for early coronary artery disease.   REVIEW OF SYSTEMS:  Ten-point Review of Systems was significant for multiple  joint pains, lupus, otherwise unremarkable.   PHYSICAL EXAMINATION:  VITAL SIGNS:  At the time of presentation to the ED,  temperature  97.5, blood pressure 106/59, heart rate 91, respiratory rate  16, saturating 100%.  Blood pressure, however, has remained stable.  CBG was  156.  GENERAL:  Overweight female in no apparent distress.  HEENT:  No pallor, no icterus.  Oral mucous membranes are moist.  No oral  lesions are seen.  LUNGS:  Clear to auscultation bilaterally.  CARDIOVASCULAR:  S1, S2 regular.  Systolic murmur appreciated in the apex.  No rubs appreciated. No S3 or S4.  ABDOMEN: Soft, nontender, nondistended. Bowel sounds present.  No mass or  organomegaly appreciated.  EXTREMITIES:  Without edema.  Distal pulses palpable.  NEUROLOGIC: The patient is alert and oriented x3, no focal neurologic  deficits appreciated.   LABORATORY DATA:  CBC reveals white count 4.7, hemoglobin 11.7, MCV 81,  platelet count 232, 39% neutrophils.  She does have a low neutrophil count.  D-dimer was 0.83.  BMP showed sodium 138, potassium 3.7, actually  unremarkable except glucose 120, BUN 5.  Initial set of cardiac markers in  the ED were negative; however, one at 10:30 in the morning showed troponin  0.24, CK-MB 6.2, relative index 5.8.  BNP was less than 30.   Imaging studies revealed chest x-ray which did not show any acute findings.   The patient had a CT angiogram chest because of elevated D-dimer which did  not show any acute pulmonary emboli.  Some small cavitary nodules were noted  in the lung fields, some axillary adenopathy which was stable.  IMPRESSION:  This is a 45 year old African-American female with a past   medical history  of system lupus erythematosus, coronary artery disease,  hypertension, dyslipidemia, who presented to the emergency department with  retrosternal chest pain which was relieved only after 5 nitroglycerin.  The  patient also had gastrointestinal cocktail prior to the relief of pain.  Since she has elevated cardiac enzymes ACS is now likely. PE is ruled out  with CT.  She also has system lupus erythematosus which appears to be stable  at this time.   PLAN:  1.  Chest pain: This morning I consulted [facility] to see the      patient because of her symptoms, history, and lab findings.  The patient      was evaluated by attending physician from [facility], and it has      been decided to transfer this patient to [facility] for further      evaluation in the form of cardiac catheterization.  The patient is      currently awaiting transfer to that hospital.  In the meantime, the      patient continues to be on Coreg, Plavix.  Since she does have a low EF,      she might benefit from an ACE inhibitor which I am not sure why she is      not on unless there was previous contraindication.  I will defer these      issues to the cardiologist.  2.  System lupus erythematosus (SLE): We should continue patient's CellCept,      Plaquenil, and prednisone for now.  SLE appears to be in stable      condition at this time.  3.  Other medications will be continued.   PLEASE NOTE: PATIENT IS IN PROCESS OF BEING TRANSFERRED TO [facility].      Osvaldo Shipper, MD  Electronically Signed     GK/MEDQ  D:  11/04/2005  T:  11/04/2005  Job:  [id number]   cc:   Milus Mallick. Lodema Hong, M.D.  Fax: [phone]   Vida Roller, M.D.  Fax: [phone]

## 2011-01-03 NOTE — Discharge Summary (Signed)
[name], [name]                        ACCOUNT NO.:  [id number]   MEDICAL RECORD NO.:  [id number]                   PATIENT TYPE:  INP   LOCATION:  3727                                 FACILITY:  [facility]   PHYSICIAN:  Gene Serpe, P.A. [facility]                DATE OF BIRTH:  [birth year]   DATE OF ADMISSION:  10/06/2002  DATE OF DISCHARGE:  10/09/2002                           DISCHARGE SUMMARY - REFERRING   PROCEDURES:  Adenosine Cardiolite study, 10/07/02.   REASON FOR ADMISSION:  The patient is a 45 year old female, with prior  history of acute MI in 2/03 treated with stenting of the RCA, and repeat  inferior MI, due to in-stent thrombosis in 5/03, who presented with  recurrent chest pain similar to her prior presentations.  Please refer to  dictated admission note for full details.   LABORATORY DATA:  Cardiac enzymes: CPK-MB, and troponin I markers normal.  Serial electrolytes and renal function normal.  Liver enzymes negative.  Glucose levels normal; hemoglobin A1C 6.7.  INR 0.9.  Lipid profile showed a  cholesterol of 120, triglycerides 110, HDL 36, LDL 62. (ratio 3.3).  Antiphospholipid panel and delayed in factor five test __________.   HOSPITAL COURSE:  Following admission, patient ruled out for myocardial  infarction with all serial cardiac markers within normal limits.  She was  thus referred for Adenosine Cardiolite stress-testing.  This suggested  dilated left ventricle with global hypokinesis, and a large area of scar  involving apex/inferior/inferolateral walls; there was question of minimal  ischemia along the proximal margin of the inferolateral defect, but not  definite; calculated ejection fraction 25%.  These results were reviewed by  Dr. Dorethea Clan and recommendation was to proceed with coronary angiography.   Additional studies, however, consisted of a CT scan of the chest for  exclusion of pulmonary embolus.  Although this was negative, there was a  suggestion of  mediastinal adenopathy, suggestion of bilateral axillary  adenopathy and the question of lymphoma was raised.   A CT scan was reviewed by Dr. Sung Amabile who found no evidence of intrathoracic  pathology.  He noted that specifically there was no mediastinal adenopathy.  There was, however, right axillary with question of left axillary adenopathy  as well.  Dr. Sung Amabile did recommend further evaluation and recommended  general surgery for excisional biopsy versus FNA by radiology.  He concluded  that this most likely represented nonspecific adenopathy related to systemic  lupus erythematosus.   These findings were conveyed by Dr.Rothbart, at time of discharge, to Dr.  Violeta Gelinas who agreed with proceeding with outpatient evaluation with  him.  The patient will be instructed to bring her CT scan film with her.   Dr. Dietrich Pates also commented on the Cardiolite results and felt that this  represented a poor quality study; he noted that the ejection fraction may  not be accurate.   FINAL RECOMMENDATIONS:  Are for the  patient to return for elective coronary  angiography, with Dr. Vida Roller, followed by subsequent cardiac follow  up with him in [place], where the patient resides.  Arrangements will  also be made for patient to follow with Dr. Violeta Gelinas.   MEDICATIONS AT DISCHARGE:  Lipitor 20 daily, Lopressor 25 mg b.i.d., Plavix  75 mg daily, CellCept 500 mg b.i.d., Glucosamine as previously directed,  Plaquenil 200 mg b.i.d., Altace 10 mg daily, Prednisone 10 mg daily,  Protonix 40 mg daily, aspirin 325 mg daily, Nitrostat 0.4 mg p.r.n., Ambien  p.r.n.   INSTRUCTIONS:  Arrangements will be made for patient to return for elective  coronary angiography with Dr. Vida Roller, next week. We will also  schedule outpatient follow up for her with Dr. Violeta Gelinas  - patient  will be instructed to bring her chest CT scan film with her.   DISCHARGE DIAGNOSES:  1. Recurrent chest  pain.  1a.  Normal serial cardiac markers.  1b.  Question minimal peri-infarct ischemia; ejection fraction 25% -  Adenosine Cardiolite 10/07/02.  1. Coronary artery disease.  1a.  Status post inferior myocardial infarction/stent right coronary artery  2/03.  2b.  Repeat inferior myocardial infarction (secondary to in-stent  thrombosis) 5/03.  1. Bilateral axillary adenopathy.  2. Systemic lupus erythematosus.  3. Hypertension.  4. Dyslipidemia.  5. History of tobacco.  6. Type 2 diabetes mellitus.                                               Gene Serpe, P.A. [facility]    GS/MEDQ  D:  10/09/2002  T:  10/09/2002  Job:  [id number]   cc:   Milus Mallick. Lodema Hong, M.D.  [address]  [place]  [place] [address]  Fax: [phone]   Gabrielle Dare. Janee Morn, M.D.  [facility]  [address] [place], [place] [address]  Fax: [phone]

## 2011-01-03 NOTE — H&P (Signed)
[name], [name]                        ACCOUNT NO.:  [id number]   MEDICAL RECORD NO.:  [id number]                   PATIENT TYPE:  AMB   LOCATION:  [name]                                  FACILITY:  [facility]   PHYSICIAN:  Jerolyn Shin C. Katrinka Blazing, M.D.                DATE OF BIRTH:  [birth year]   DATE OF ADMISSION:  DATE OF DISCHARGE:                                HISTORY & PHYSICAL   HISTORY OF PRESENT ILLNESS:  [age]-year-old female who was undergoing  evaluation for chest pain when she had a CT of her chest done at [facility].  She was noted to have mediastinal and bilateral axillary  adenopathy.  Acute myocardial infarction and pulmonary embolus were ruled  out during that hospitalization in February 2004.  The patient has had  subsequent stress testing and is stable.  She has bilateral palpable  axillary adenopathy and will undergo left axillary node dissection for  diagnosis.  There is a major concern that she may have lymphoma.   PAST HISTORY:  She had atherosclerotic heart disease, status post myocardial  infarction in February 2003.  She had total occlusion of the right coronary  artery and underwent stenting.  She had a repeated myocardial infarction in  May 2003 due to in-stent thrombosis.  She had subsequent declotting and  repeat stenting.  She has preserved function with ejection fraction of about  40%.  The patient also has lupus nephritis and chronic lupus dermatitis with  a history of hypertension, diabetes mellitus and dyslipidemia.   SURGERY:  Cholecystectomy, hysterectomy and rotator cuff repair.   MEDICATIONS:  1. Lipitor 20 mg q.h.s.  2. Lopressor 25 mg b.i.d.  3. Plavix 75 mg daily.  4. Plaquenil 200 mg b.i.d.  5. Altace 10 mg daily.  6. Prednisone 10 mg daily.  7. Protonix 40 mg daily.  8. Aspirin 325 mg daily.  9. Ambien 10 mg q.h.s.  10.      CellCept 500 mg b.i.d.  11.      Xanax 0.5 mg t.i.d.  12.      Quinacrine 100 mg daily.   SOCIAL  HISTORY:  She is disabled due to her medical illness.  She is  married.  She smokes one pack of cigarettes per day.   PHYSICAL EXAMINATION:  VITAL SIGNS:  On examination, blood pressure 110/64,  pulse 72, respirations 20, weight 220 pounds.  HEENT:  HEENT is unremarkable except for the rash of her face and forehead  due to lupus.  NECK:  Neck is supple.  I cannot palpate any significant cervical  adenopathy.  CHEST:  Chest clear to auscultation.  No rales, rubs, rhonchi or wheezes.  HEART:  Regular rate and rhythm without murmur, gallop or rub.  ABDOMEN:  Abdomen soft, nontender.  No masses.  Active bowel sounds.  EXTREMITIES:  No cyanosis, clubbing or edema.  NODES:  There are palpable  nodules in both axillae, more prominent on the  left than on the right.  NEUROLOGIC:  Exam nonfocal.   IMPRESSION:  1. Mediastinal and bilateral axillary adenopathy.  2. Atherosclerotic heart disease, status post myocardial infarction.  3. Systemic lupus erythematosus with lupus nephritis and lupus dermatitis.  4. Hypertension.  5. Diabetes mellitus.  6. Dyslipidemia.   PLAN:  Left axillary node dissection under anesthesia.                                               Dirk Dress. Katrinka Blazing, M.D.    LCS/MEDQ  D:  12/13/2002  T:  12/13/2002  Job:  [id number]

## 2011-01-03 NOTE — H&P (Signed)
[name], [name]                        ACCOUNT NO.:  [id number]   MEDICAL RECORD NO.:  [id number]                   PATIENT TYPE:  INP   LOCATION:  3727                                 FACILITY:  [facility]   PHYSICIAN:  Salvadore Farber, M.D. [facility]         DATE OF BIRTH:  November 05, 1965   DATE OF ADMISSION:  10/06/2002  DATE OF DISCHARGE:                                HISTORY & PHYSICAL   HISTORY OF PRESENT ILLNESS:  The patient is a 45 year old lady with coronary  artery disease who presented with acute myocardial infarction in February  2003.  She had a 90% stenosis of the midportion of the RCA which was  stented.  She represented with repeat inferior myocardial infarction due to  stent thrombosis in May of 2003.  This was angioplastied with good results.  Subsequent EF was 44% with inferior akinesis and anterolateral hypokinesis.  She has since done well without exertional dyspnea, orthopnea, edema,  angina, or claudication.   Today at 6:30 a.m., while bathing her son, she developed the relatively  acute onset of substernal sharp chest discomfort radiating to her right arm  and back associated with diaphoresis and mild dyspnea.  She took a single  sublingual nitroglycerin which improved the pain from 10 out of 10 to 5 out  of 10.  The pain waxed and waned throughout the course of the morning,  principally between 2 and 5 out of 10.  She took a nap.  Finally, the pain  was finally resolved at approximately 2 p.m.  She felt that the pain was  mildly worse with exertion and also mildly exacerbated with deep  inspiration.  She feels this is different quality from the pain that  accompanied her myocardial infarction.   She is currently asymptomatic asking to go home, stating that she feels  completely normal.   PAST MEDICAL HISTORY:  1. Lupus diagnosed in 2001.  2. Hypertension.  3. Diabetes mellitus.  4. Dyslipidemia.  5. Coronary disease as above.  6. Status post  cholecystectomy.  7. Status post hysterectomy.   ALLERGIES:  No known drug allergies.   MEDICATIONS:  1. Lipitor 20 mg p.o. q.h.s.  2. Quinacrine 100 mg per day.  3. CellCept 500 mg b.i.d.  4. Lopressor 25 mg b.i.d.  5. Plavix 75 mg q.d.  6. Ambien 10 mg q.h.s.  7. Plaquenil 200 mg b.i.d.  8. Altace 10 mg q.d.  9. Prednisone 10 mg q.d.  10.      Xanax 0.5 mg q.8h. p.r.n.  11.      Protonix 40 mg q.d.  12.      Aspirin 325 mg q.d.  13.      Nitroglycerin p.r.n.   SOCIAL HISTORY:  The patient lives in [place] with her husband.  She is  disabled due to her lupus.  She cares for four children-two her own and two  grandchildren who live with her.  She smokes less than one pack per day.   FAMILY HISTORY:  Mother is alive and well at 13.  Father is alive and well  at unclear age.   REVIEW OF SYMPTOMS:  Remarkable for 30 pound weight gain over the past two  months.  Upper dentures.  Right wrist arthralgias attributed to her lupus.   PHYSICAL EXAMINATION:  GENERAL:  Generally well-appearing woman in no  distress.  VITAL SIGNS:  She has a heart rate of 74, respiratory rate of 22, blood  pressure 107/65.  Oxygen saturation of 98% on room air.  SKIN:  She has a lupus malar rash on her face.  LUNGS:  Clear to auscultation.  There is no pleural rub.  HEART:  She has a nondisplaced point of maximal cardiac impulse.  There is a  regular rate and rhythm without murmur, rub, or gallop.  ABDOMEN:  Soft, obese, nondistended, and nontender.  There is no  hepatosplenomegaly.  Bowel sounds are normal.  EXTREMITIES:  Warm without clubbing, cyanosis, edema, or ulceration.  PULSES:  Carotid pulses are 2+ bilaterally without bruits.  Abdominal pulses  are 2+ bilaterally without bruits.   LABORATORY DATA:  Electrocardiogram is unchanged compared with an  electrocardiogram of May of 15, 2003.  The lateral ST depressions are  slightly deeper though the T-wave inversions are not new.  Also QRS  voltage  is substantially increased, perhaps related to change in lead placement.   Chest x-ray and laboratory studies are all pending.   IMPRESSION/RECOMMENDATIONS:  A 45 year old lady with lupus, hypertension,  dyslipidemia, and diabetes mellitus with coronary disease status post  inferior infarction x 2.  She now presents with chest discomfort different  from her prior myocardial ischemia.  However, it is concerning in that it  was clearly complicated by dyspnea and diaphoresis.  Differential diagnosis  includes myocardial ischemia and pulmonary embolus.  She has no history of  travel, immobility, or edematous suggest deep vein thrombosis.  In addition,  she is neither hypoxic nor tachycardic at present.  I think these argue  somewhat against pulmonary embolus.  Her electrocardiogram is unchanged from  present arguing somewhat against myocardial infarction.  Await cardiac  enzymes.  We will plan continued therapy  with aspirin, ACE inhibitor, beta blocker.  We will add low molecular weight  heparin.  We will obtain chest CT using the pulmonary embolus protocol this  evening to exclude pulmonary embolus.  We will then plan probable exercise  testing with Cardiolite imaging to exclude recurrent myocardial ischemia.                                                Salvadore Farber, M.D. [facility]    WED/MEDQ  D:  10/06/2002  T:  10/06/2002  Job:  [id number]   cc:   Milus Mallick. Lodema Hong, M.D.  [address]  [place]  [place] [address]  Fax: [phone]

## 2011-01-03 NOTE — Discharge Summary (Signed)
NAMEAVANTI, [name]              ACCOUNT NO.:  [id number]   MEDICAL RECORD NO.:  [id number]          PATIENT TYPE:  INP   LOCATION:  3731                         FACILITY:  [facility]   PHYSICIAN:  Maple Mirza, P.A. DATE OF BIRTH:  1965-09-27   DATE OF ADMISSION:  01/02/2006  DATE OF DISCHARGE:  01/03/2006                                 DISCHARGE SUMMARY   ALLERGIES:  PATIENT HAS NO KNOWN DRUG ALLERGIES.   PRINCIPAL DIAGNOSES:  1.  Discharged on day 1 status post implantation of Medtronic Maximo single-      chamber cardioverted defibrillator with intraoperative defibrillator in      the thresholds test.  Dr. Sherryl Manges.  2.  Mixed cardiomyopathy, both nonischemic and ischemic.  3.  Ejection fraction 15% to 20% with class III congestive heart failure      symptoms.  4.  Narrow QRS with no evidence of tissue Doppler to synchronize.  5.  Resting tachycardia and relative hypotension.   SECONDARY DIAGNOSES:  1.  Lupus.  2.  Hypertension.  3.  Patient has recently discontinued tobacco habituation.  4.  Diabetes mellitus.  5.  Hyperlipidemia.   PROCEDURE:  On Jan 02, 2006, implantation of Medtronic single-chamber  cardioverted defibrillator.  Dr. Sherryl Manges.  Patient has no  postprocedural complications.  Discharged with a defibrillator pocket  without hematoma.  Chest x-ray that shows the lead in appropriate position  without pneumothorax.   DISCHARGE MEDICATIONS:  1.  Aspirin 325 mg daily.  2.  Hydrochlorothiazide 25 mg daily.  3.  Coreg 25 mg twice daily.  4.  Lipitor 20 mg daily at bedtime.  5.  Protonix 40 mg daily.  6.  K-Dur 20 mEq daily.  7.  Tylox 5/500 twice daily.  8.  Xanax 1 mg at bedtime.  9.  CellCept 1 g twice daily.  10. Plavix 75 mg daily.  11. Protopic ointment twice daily.  12. Wellbutrin 300 mg daily.  13. Prednisone 20 mg daily.  14. Chantix 1 mg twice daily.  15. Actonel 35 mg weekly.  16. Diclofenac 75 mg daily.   PLAN:  1.  She has  followup at [facility], [address], [facility] Wednesday, January 21, 2006 at 9:40.  2.  She will see Dr. Barbera Setters Tuesday, March 31, 2006, at 3:00.   20 minutes on this.      Maple Mirza, P.A.     GM/MEDQ  D:  02/10/2006  T:  02/10/2006  Job:  [id number]   cc:   Duke Salvia, M.D.  [address]  [address]  [place]  [place] [address]   Milus Mallick. Lodema Hong, M.D.  Fax: [phone]

## 2011-01-03 NOTE — Discharge Summary (Signed)
[place]. [facility]  Patient:    [name], [name] Visit Number: [id number] MRN: [id number]          Service Type: CAT Location: [facility] 2860 01 Attending Physician:  Ronaldo Miyamoto Dictated by:   Brita Romp, P.A.-C. Admit Date:  01/14/2002 Discharge Date: 01/14/2002   CC:         Luis Abed, M.D. [facility]  Syliva Overman, M.D.   Discharge Summary  DISCHARGE DIAGNOSIS:  Pseudoaneurysm, right groin.  HISTORY OF PRESENT ILLNESS:  [name] is a 45 year old female who underwent a cardiac catheterization with PCI on or about Jan 01, 2002. Approximately two days prior to admission, the patient noticed increased pain in the right groin area.  A vascular ultrasound at [facility] revealed a pseudoaneurysm, and she was referred to the short-stay area at [facility]. [facility] for compression.  HOSPITAL COURSE:  On Jan 14, 2002, the patient reported to the short-stay section.  Repeat vascular ultrasound confirmed the presence of the pseudoaneurysm.  The patient received a total of 100 mg of Demerol, 25 mg of Phenergan, as well as 6 mg of Versed.  The pseudoaneurysm was compressed by the vascular technician without complications.  A repeat vascular ultrasound approximately eight hours later revealed a successful compression.  The patient was then felt to be stable for discharge home. Dictated by:   Brita Romp, P.A.-C. Attending Physician:  Ronaldo Miyamoto DD:  01/14/02 TD:  01/17/02 Job: [id number]

## 2011-01-03 NOTE — Discharge Summary (Signed)
[place]. [facility]  Patient:    [name], [name] Visit Number: [id number] MRN: [id number]          Service Type: MED Location: 2000 2024 01 Attending Physician:  Ronaldo Miyamoto Dictated by:   Dian Queen, P.A.C. [facility] Admit Date:  12/30/2001 Disc. Date: 01/02/02   CC:         Syliva Overman, M.D., [place], [place]   Referring Physician Discharge [place]  HISTORY:  Essentially the problem is that of a 45 year old African-American female with systemic lupus, history of hypertension, and glucose intolerance. She has known coronary artery disease per prior cath in February of this year, at which time she underwent RCA stenting.  Thereafter, she was doing well.  On the day of admission, she was playing bingo and began noticing left arm numbness and chest discomfort associated with diaphoresis.  She took a nitroglycerin with some relief, but then the pain reoccurred later, and she came on the emergency room at [facility], after which she was transferred here for further evaluation and therapy.  Other problems include prior cholecystectomy, hysterectomy, rotator cuff repair.  As noted, she has lupus and is followed for such at [facility] and is currently doing well on low-dose steroids.  ACCESSORY CLINICAL FINDINGS:  Electrolytes and renal function totally within normal limits.  Hemoglobin 8.8 with hematocrit of 26.6 (down from 9.6/28.9 on admission).  CK-MBs peaked at 294/249, and troponins peaked at 16.32.  HOSPITAL COURSE:  The patient was transferred urgently from [facility] with ongoing DMI and taken emergently to the cath lab where Dr. Riley Kill proceeded to open totally occluded RCA to 30% without complication.  She was given Integrilin x 36 hours.  Temporary transvenous pacemaker was inserted prophylactically.  She did have some brief atrial fibrillation with reperfusion but none thereafter.  She had no further chest pain at the time of  discharge and was fully ambulatory and doing well.  FINAL DIAGNOSES: 1. Coronary artery disease with prior DMI, February 2003, admitted now with    acute occlusion at stent site, treated with PTCA and Integrilin x 36 hours.    She had no significant disease in her left coronary system.  She did have    some inferior akinesis on left ventriculogram. 2. Hyperlipoproteinemia - on Lipitor. 3. Systemic lupus - currently on low-dose prednisone. 4. Controlled hypertension. 5. Ongoing cigarette use - quit this admission. 6. Glucose intolerance - will need to be followed carefully as an outpatient    by Dr. Lodema Hong.  DISPOSITION:  The patient is discharged home.  DISCHARGE MEDICATIONS:  1. Protonix 40 mg q.d.  2. Prednisone 10 mg q.d.  3. Lipitor 20 mg q.d.  4. Xanax p.r.n.  5. Plaquenil 20 mg q.d.  6. Plavix 75 mg q.d.  7. CellCept 1000 mg b.i.d.  8. Metoprolol 25 mg b.i.d.  9. Sonata p.r.n. 10. Aspirin 81 mg q.d. 11. Altace 10 mg q.d.  FOLLOW-UP:  She will follow up in the office in a couple of weeks and with Dr. Lodema Hong long-term. Dictated by:   Dian Queen, P.A.C. [facility] Attending Physician:  Ronaldo Miyamoto DD:  01/02/02 TD:  01/02/02 Job: [id number]

## 2011-01-03 NOTE — H&P (Signed)
[place]. [facility]  Patient:    [name], GUNNELLS Visit Number: [id number] MRN: [id number]          Service Type: EMS Location: ED Attending Physician:  Annamarie Dawley Dictated by:   Luis Abed, M.D. [facility] Admit Date:  10/13/2001 Discharge Date: 10/13/2001   CC:         Syliva Overman, M.D. in [place]   History and Physical  HISTORY OF PRESENT ILLNESS:  [name] is a 45 year old female who presented to the emergency room at [facility] with two days of intermittent chest pain but then sustained pain on the day of admission.  She had inferior ST elevation and was immediately evaluated aggressively in the emergency room. Heparin and aspirin and nitroglycerin were started.  Then, Integrilin was used and the patient was transferred urgently to [facility].  PAST MEDICAL HISTORY:  There is no known prior cardiac history.  MEDICATIONS:  Include prednisone and Plaquenil.  The other medications will be obtained after the patient comes out of the catheterization laboratory.  ALLERGIES:  None known.  OTHER MEDICAL PROBLEMS:  See the list below.  SOCIAL HISTORY:  The patient lives in [place].  She is married and unemployed currently, with disability due to her lupus.  She does smoke a half a pack a day.  FAMILY HISTORY:  There is no strong family history of coronary disease.  REVIEW OF SYSTEMS:  The patient has had some hot flashes.  She has had some headaches.  She has easy bruisability.  She has recently had some edema and has chest pain and shortness of breath with this admission.  She has no GU symptoms.  She does have arthralgias and swelling related to her lupus.  With her GI tract she has some nausea and vomiting.  PHYSICAL EXAMINATION:  The physical exam was done upon arrival in the catheterization laboratory and it is recorded in the chart by Dr. Gerri Spore.  HEENT:  Revealed no significant findings.  CHEST:  Clear.  CARDIAC:   Revealed an S1 with an S2.  There was no significant murmur.  ABDOMEN:  Benign with bowel sounds.  EXTREMITIES:  Stable.  LABORATORY DATA:  EKG showed that the patient had inferior ST elevation compatible with an acute inferior MI.  Hemoglobin 11.7.  BUN 7 with creatinine 0.7.  Her first troponin was 0.74 and total CPK was only 79 with MB 11.6.  PROBLEMS: 1. History of hypertension. 2. Diabetes. 3. Systemic lupus erythematosus.  The patient is on prednisone and Plaquenil    for this. 4. Acute diaphragmatic myocardial infarction.  The patient is now in the    catheterization laboratory for emergency intervention. Dictated by:   Luis Abed, M.D. [facility] Attending Physician:  Annamarie Dawley DD:  10/13/01 TD:  10/13/01 Job: [id number]

## 2011-01-03 NOTE — Discharge Summary (Signed)
[name], [name]                        ACCOUNT NO.:  [id number]   MEDICAL RECORD NO.:  [id number]                   PATIENT TYPE:  INP   LOCATION:  2002                                 FACILITY:  [facility]   PHYSICIAN:  Rollene Rotunda, M.D.                DATE OF BIRTH:  02-23-1966   DATE OF ADMISSION:  06/29/2003  DATE OF DISCHARGE:  07/01/2003                                 DISCHARGE SUMMARY   DISCHARGE DIAGNOSES:  1. Status post non-ST elevation myocardial infarction with a peak troponin I     of 2.15 this admission.  2. Coronary artery disease.     a. Patent right coronary artery stent by catheterization this admission.     b. Culprit for non-ST elevation myocardial infarction:  Small side branch        of ramus intermedius - medical therapy.  3. Nonischemic cardiomyopathy with an ejection fraction of 29% (felt to be     related to lupus).  4. Systemic lupus erythematosus.  5. Diabetes mellitus.  6. Dyslipidemia.  7. Hypertension.  8. Leukopenia.  9. Mild mitral regurgitation.  10.      Anemia.   PROCEDURE PERFORMED THIS ADMISSION:  Cardiac catheterization by Dr. Daisey Must on June 30, 2003.  Please see the dictated note for complete  details.  RCA stent patent.  Proximal LAD with 30% stenosis.  Left main with  30% stenosis.  Small side branch of the ramus intermedius with ostial 95%  stenosis - very small vessel - medical therapy recommended.   HOSPITAL COURSE:  Please see the dictated admission history and physical by  Dr. Rollene Rotunda on June 29, 2003, for complete details.  Briefly,  this 45 year old female with a history of lupus and dilated cardiomyopathy  and coronary artery disease status post stenting of her RCA presented to the  emergency room with complaints of chest pain.  Her EKG was negative.  She  was setup for spiral chest CT to rule out pulmonary embolism and  echocardiogram to rule out pericarditis.  Her chest CT was negative for  pulmonary embolism.  The 2-D echocardiogram revealed an EF of 25%, mild MR,  mild left atrial enlargement, inferior/posterior akinesis, other walls  hypokinetic and no pericardial effusion.  The patient's troponin came back  positive and peaked at 2.15.  Her CK-MB peaked at 13.9.  She was  subsequently taken for cardiac catheterization.  This was done June 30, 2003, by Dr. Loraine Leriche Pulsipher.  Given the nature of the small side branch of  the ramus intermedius which was felt to be the culprit lesion, it was  decided that medical therapy is warranted.  On the morning of July 01, 2003, the patient was found to be in stable condition without any further  chest pain or shortness of breath.  Her right groin was without hematoma or  bruit.  It was felt  she was stable enough for discharge to home and Dr. Myrtis Ser  saw the patient.  She will need follow up with Dr. Dorethea Clan in [place] in  the next two weeks.   LABS:  White count 2.6, hemoglobin 10.4, hematocrit 31.5, platelet count  218,000.  Sodium 142, potassium 3.7, BUN 6, creatinine 0.7, glucose 127,  magnesium 1.9.  INR 1.0.  Brain natriuretic peptide level 85.3.  Cardiac  enzymes as noted above.  Total cholesterol 123, triglycerides 190, HDL 22,  LDL 63.  Total protein 7.7, albumin 3.6, AST 29, ALT 31, alkaline  phosphatase 73, total bilirubin 0.8.   DISCHARGE MEDICATIONS:  1. Coreg 25 mg twice daily.  2. Lipitor 20 mg daily.  3. Wellbutrin XL 150 mg daily.  4. Imipramine 25 mg q.h.s.  5. Plaquenil 20 mg twice daily.  6. CellCept 100 mg twice daily.  7. Protonix 40 mg daily.  8. Prednisone 5 mg daily.  9. Aspirin 325 mg daily.  10.      Plavix 75 mg daily.  11.      Tylox p.r.n.  12.      Restoril p.r.n.  13.      Xanax p.r.n.  14.      Nitroglycerin p.r.n. chest pain.   PAIN MANAGEMENT:  Tylenol as needed.  Nitroglycerin as needed for chest  pain.   She is to call our office in [place] __________ recurrent chest pain.    ACTIVITY:  No driving, heavy lifting, exertion, work, or sex for three days.   DIET:  Low-fat, low-sodium diabetic.   WOUND CARE:  The patient is to call our office in [place] for any groin  swelling, bleeding or bruising.   FOLLOW UP:  She will follow up with Dr. Dorethea Clan in two weeks in [place]  and the office will call her with an appointment.  She should follow up with  Dr. Lodema Hong as needed.      Tereso Newcomer, P.A.                        Rollene Rotunda, M.D.    SW/MEDQ  D:  07/01/2003  T:  07/01/2003  Job:  [id number]   cc:   Milus Mallick. Lodema Hong, M.D.  [address]  [place], [place] [address]  Fax: [phone]   Vida Roller, M.D.  Fax: [phone]

## 2011-01-03 NOTE — Op Note (Signed)
   NAMEAARIYA, [name]                        ACCOUNT NO.:  [id number]   MEDICAL RECORD NO.:  [id number]                   PATIENT TYPE:  AMB   LOCATION:  [name]                                  FACILITY:  [facility]   PHYSICIAN:  Jerolyn Shin C. Katrinka Blazing, M.D.                DATE OF BIRTH:  [birth year]   DATE OF PROCEDURE:  12/13/2002  DATE OF DISCHARGE:                                 OPERATIVE REPORT   PREOPERATIVE DIAGNOSIS:  Diffuse adenopathy, rule out lymphoma.   POSTOPERATIVE DIAGNOSIS:  Diffuse adenopathy, rule out lymphoma.   PROCEDURE:  Left axillary node dissection.   SURGEON:  Dirk Dress. Katrinka Blazing, M.D.   DESCRIPTION OF PROCEDURE:  Under general anesthesia, a transverse incision  was made across the mid aspect of the left axilla.  Dissection of the  subcutaneous tissue was taken down to the deep axilla.  Using both blunt and  sharp dissection the fibroareolar tissue in the lower and middle aspect of  the axilla before it is dissected.  There were at least three large nodes in  this tissue with some smaller nodes.  This was sent to pathology.  Hemostasis was achieved with ties of 3-0 Dexon and suture ligatures of 3-0  Dexon.  The tissue was reapproximated using interrupted 3-0 Biosyn.  Skin  was closed with 4-0 Dexon.  No drain was used.  The patient tolerated the  procedure well.  She was awakened from anesthesia and transferred to a bed  and taken to the post anesthesia care unit for monitoring.                                               Dirk Dress. Katrinka Blazing, M.D.    LCS/MEDQ  D:  12/13/2002  T:  12/13/2002  Job:  [id number]   cc:   Jacqualine Mau  [address]  [place]  [place] [address]  Fax: [phone]

## 2011-01-03 NOTE — Cardiovascular Report (Signed)
[name], [name]                        ACCOUNT NO.:  [id number]   MEDICAL RECORD NO.:  [id number]                   PATIENT TYPE:  INP   LOCATION:  2002                                 FACILITY:  [facility]   PHYSICIAN:  Carole Binning, M.D. [facility]         DATE OF BIRTH:  March 20, 1966   DATE OF PROCEDURE:  06/30/2003  DATE OF DISCHARGE:                              CARDIAC CATHETERIZATION   PROCEDURE PERFORMED:  Left heart catheterization with coronary angiography  and left ventriculography.   INDICATIONS:  [name] is a 45 year old woman with history of diabetes,  hypertension, systemic lupus erythematosus, and cardiomyopathy.  She has  history of coronary artery disease and underwent stent placement to the  right coronary artery approximately 18 months ago for an acute coronary  syndrome.  She presented to the hospital yesterday with chest pain and ruled  in for a small non Q-wave myocardial infarction with elevated troponin and  CK-MB.  She was referred for cardiac catheterization.   PROCEDURAL NOTE:  We initially placed a 5-French sheath in the right femoral  artery.  We attempted to perform a coronary angiography with a 4-French JL4  catheter.  However, due to patient's size we were unable to adequately image  the coronary arteries.  We therefore up sized to a 6-French system.  Coronary angiography was performed with a 6-French JL4 and JR4 catheters.  Left ventriculography was performed with an angled pigtail catheter.  Contrast was Omnipaque.  There were no complications.   RESULTS:  HEMODYNAMICS:  Left ventricular pressure 110/16.  Aortic pressure 106/80.  There is no  aortic valve gradient.   LEFT VENTRICULOGRAM:  There is severe global hypokinesis in the left ventricle and most pronounced  in the mid to basal inferior wall as well as basal anterior wall.  Ejection  fraction calculated at 29%.  There is no mitral regurgitation.   CORONARY ARTERIOGRAPHY:  (Right  dominant)  Left main has a distal 30% stenosis.   Left anterior descending artery has an ostial 30% stenosis.  The LAD is  otherwise normal giving rise to a small diagonal branch.   Left circumflex gives rise to a very large branching ramus intermedius, two  small marginal branches, and two small posterolateral branches.  The ramus  intermedius gives off two branches.  The more medial branch has a 95%  stenosis at its ostium.  However, this is a small caliber branch being less  than or equal to 1.5 mm in diameter.  The circumflex otherwise has minor  luminal irregularities, but no significant stenoses.   Right coronary artery has a 20% stenosis in the mid vessel.  Just distal to  this there is a stent in the distal portion of the mid vessel which is  widely patent with 0% stenosis within the stent.  The distal right coronary  artery is a large posterior descending artery.   IMPRESSION:  1. Severely decreased left ventricular  systolic function secondary to a     nonischemic cardiomyopathy.  2. Patent stent in the right coronary artery.  3. One vessel coronary artery disease characterized by a 95% stenosis and a     small side branch of the ramus intermedius.  This appears to be a culprit     for the patient's acute coronary syndrome.   PLAN:  These findings were reviewed with Charlies Constable, M.D.  It is our  opinion that this branch is very small in diameter and given the ostial  location of the lesion and the small caliber of the branch, it would be  difficult to achieve an acceptable intermediate and long-lasting result with  percutaneous coronary intervention.  We therefore opted to proceed with  medical therapy.                                               Carole Binning, M.D. [facility]    MWP/MEDQ  D:  06/30/2003  T:  07/01/2003  Job:  [id number]   cc:   Vida Roller, M.D.  Fax: [phone]   Cath Lab   Syliva Overman, M.D.

## 2011-01-03 NOTE — Consult Note (Signed)
[name], [name]              ACCOUNT NO.:  [id number]   MEDICAL RECORD NO.:  [id number]          PATIENT TYPE:  INP   LOCATION:  2035                         FACILITY:  [facility]   PHYSICIAN:  Olga Millers, M.D. [facility] OF BIRTH:  Jun 16, 1966   DATE OF CONSULTATION:  11/04/2005  DATE OF DISCHARGE:                                   CONSULTATION   CARDIOLOGY CONSULTATION:   HISTORY OF PRESENT ILLNESS:  The patient is a 45 year old female with a past  medical history of diabetes mellitus, hypertension, hyperlipidemia, coronary  artery disease, nonischemic cardiomyopathy, lupus who we were asked to  evaluate for chest pain.  The patient has had prior PCI of her right  coronary artery in 2003 for an acute coronary syndrome.  She had a non-Q  wave myocardial infarction in November 2004 and catheterization revealed a  95% small branch of a ramus intermedius.  Medical therapy was recommended.  She typically does not have dyspnea on exertion, orthopnea, PND, pedal  edema, presyncope, syncope, or chest pain.  She was in a stressful argument  with her children last evening and developed chest tightness for  approximately 20 minutes.  This morning she awoke with a sharp substernal  chest pain that radiated to her back.  There was associated shortness of  breath but no nausea, vomiting or diaphoresis.  The pain did increase with  inspiration.  She did take nitroglycerin without relief and the pain  persisted.  She therefore presented to the emergency room and received  nitroglycerin, morphine and GI cocktail.  She did have relief and she feels  it was with the GI cocktail.  She was admitted for rule out myocardial  infarction and cardiology was asked to evaluate.   PAST MEDICAL HISTORY:  Her past medical history is significant for diabetes  mellitus, hypertension and hyperlipidemia.  She does have a history of  lupus.  She has a history of coronary disease as outlined in the HPI as well  as  cardiomyopathy that is felt to be nonischemic.  She also has a history of  hysterectomy and cholecystectomy.   SOCIAL HISTORY:  She does smoke but she does not consume alcohol.  She  denies any drug use.   FAMILY HISTORY:  Her family history is negative for coronary artery disease.   MEDICATIONS PRIOR TO ADMISSION:  1.  Coreg 25 mg p.o. b.i.d.  2.  Potassium 20 mEq p.o. daily.  3.  Prednisone 5 mg p.o. daily.  4.  Lipitor 40 mg p.o. at bedtime.  5.  Fluconazole 100 mg p.o. daily.  6.  Diclofenac 75 mg p.o. daily.  7.  Entex.  8.  Protonix.  9.  Plavix.  10. Voltaren.  11. Bupropion.  12. Hydrochlorothiazide.  13. Spiriva.  14. Advair.   REVIEW OF SYSTEMS:  She denies any headaches or fevers or chills.  There is  no productive cough or hemoptysis.  There is no dysphagia, odynophagia,  melena, or hematochezia.  There is no dysuria or hematuria.  There is no  rash or seizure activity.  There was orthopnea last evening  but there is no  PND or pedal edema.  She does have arthralgias.  The remaining systems are  negative.   PHYSICAL EXAMINATION:  Her blood pressure is 102/59 and her pulse is 72.  She is afebrile.  She is well developed and somewhat obese.  She is no acute  distress.  Her skin is warm and dry.  She does not appear depressed and  there is no peripheral clubbing.  Her HEENT is unremarkable with normal  eyelids.  Her neck is supple with normal upstroke bilaterally and I cannot  appreciate bruits.  There is no jugular venous distension and no thyromegaly  noted.  Her chest clear to auscultation with normal expansion.  Her  cardiovascular exam reveals regular rate and rhythm with normal S1 and S2.  There are no murmurs, rubs, or gallops noted.  Abdominal exam nontender,  nondistended, positive bowel sounds, no hepatosplenomegaly and no masses  appreciated.  There is no abdominal bruit.  She has 2+ femoral pulses  bilaterally with no bruits.  Extremities show no edema  and I can palpate no  cords.  She has 2+ dorsalis pedis pulses bilaterally.  Neurological exam is  grossly intact.   LABORATORIES:  Her laboratories show a white blood cell count of 4.7 with a  hemoglobin of 11.7 and hematocrit of 35.3.  Her platelet count is 232.  Her  D-dimer is elevated at 0.83.  Her sodium is 138 with a potassium of 3.7.  Her BUN and creatinine are 5 and 0.8, respectively.  Her initial troponin  was negative but now is increased to 0.24 with an MB of 6.2.  Her  electrocardiogram shows a normal sinus rhythm.  There are nonspecific ST  changes.  A prior inferior infarct cannot be excluded.   DIAGNOSES:  1.  Probable non-Q wave myocardial infarction.  2.  Diabetes mellitus.  3.  Hypertension.  4.  Hyperlipidemia.  5.  Mild anemia.  6.  History of lupus.  7.  History of nonischemic cardiomyopathy.   PLAN:  Mrs. Stcharles presented with chest pain that is somewhat atypical in  nature but her troponin I is increased.  We will plan to continue with her  Plavix, Lipitor and Coreg.  I would recommend increasing her Lovenox to  therapeutic doses and we will also add enteric-coated aspirin 81 mg p.o.  daily.  We will plan to transfer to [facility] for cardiac  catheterization.  The risks and benefits have been discussed with the  patient and she agrees to proceed.  It should also be noted that she has had  a cardiomyopathy in the past.  If her ejection fraction is less than or  equal to 35% then I would recommend EP evaluation for possible ICD  placement.  Finally her D-dimer is mildly elevated and it should be noted  that her chest CT however did not show evidence of a pulmonary embolus.  There was stable adenopathy noted.           ______________________________  Olga Millers, M.D. [facility]     BC/MEDQ  D:  11/04/2005  T:  11/05/2005  Job:  [id number]

## 2011-01-03 NOTE — Procedures (Signed)
NAMELY, WASS                        ACCOUNT NO.:  [id number]   MEDICAL RECORD NO.:  [id number]                   PATIENT TYPE:  EMS   LOCATION:  ED                                   FACILITY:  [facility]   PHYSICIAN:  Edward L. Juanetta Gosling, M.D.             DATE OF BIRTH:  04-Nov-1965   DATE OF PROCEDURE:  DATE OF DISCHARGE:                                EKG INTERPRETATION   FINDINGS:  1. The rhythm is sinus rhythm with a rate in the 80's.  2. There is an intraventricular conduction delay.  There is probable left     atrial enlargement.  3. ST-T wave abnormalities are seen diffusely which I believe are     nonspecific but are called consistent with ischemia by the computer.   IMPRESSION:  Abnormal electrocardiogram.      ___________________________________________                                            Oneal Deputy. Juanetta Gosling, M.D.   ELH/MEDQ  D:  11/21/2003  T:  11/22/2003  Job:  [id number]

## 2011-01-03 NOTE — Letter (Signed)
October 29, 2006    Estell Harpin, M.D.  [address]  [address]  [place] [address]   RE:  Rachel, [name]  MRN:  [id number]  /  DOB:  1966/08/01   Dear Dr. Farris Has:   It was a privilege to see [name] at your request today regarding  clearance for knee surgery which, as best as I can tel, is related to  some type of aseptic necrosis process maybe involving her patella.   In any case, she is a 45 year old woman with lupus, severe nonischemic  cardiomyopathy with an ejection fraction of 15-20%, Class III congestive  heart failure with prior ICD implantation.  She continues to be  significantly limited in her exercise capacity.   She is able to walk about 100 feet, she does not have nocturnal dyspnea,  she has no palpitations and no syncope.   MEDICATIONS:  1. Aspirin 325.  2. Hydrochlorothiazide 25.  3. Coreg 25 b.i.d.  4. Protonix.  5. Potassium.  6. Tylox.  7. Xanax.  8. CellCept.  9. Plavix 75.  10.Wellbutrin.  11.Prednisone 5.  12.Chantix (again).  13.Diclofenac.  14.Lipitor.   SHE HAS NO KNOWN DRUG ALLERGIES.   EXAMINATION:  Today, her blood pressure was 120/80, her pulse was 82.  LUNGS:  Clear.  HEART SOUNDS:  Regular.  EXTREMITIES:  Without edema.   Interrogation of her Medtronic Maximo [id number] ICD demonstrates the presence  of a 6949 lead, the R wave was 10.9 with an impedance of 464, a  threshold of 0.5 at 0.5, high voltage impedance was 43/54, battery  voltage was 3.19.   IMPRESSION:  1. Nonischemic cardiomyopathy.  2. Class III congestive heart failure with a narrow QRS.  3. Status post ICD for the above.  4. Lupus.  5. Absence of ACE inhibitor/ARB therapy.   Dr. Farris Has, I think it is quite high risk to proceed with surgery for  [name] in the absence of a compelling indication to do so.  It  would be helpful for [place] if we could chat about this directly.   I have also left a message with her nephrologist in [facility]  regarding  the role of ACE/ARB therapy, as for her LV dysfunction it  would clearly be indicated.  We will wait to hear from Dr. __________  prior to initiation of such therapy.   Thank you for asking [place] to see her.    Sincerely,      Duke Salvia, MD, [facility]  Electronically Signed    SCK/MedQ  DD: 10/29/2006  DT: 10/31/2006  Job #: [id number]   CC:   Loreta Ave, M.D.  Estell Harpin, M.D.

## 2011-01-03 NOTE — Cardiovascular Report (Signed)
[facility]  Patient:    [name], [name] Visit Number: [id number] MRN: [id number]          Service Type: EMS Location: ED Attending Physician:  Annamarie Dawley Dictated by:   Daisey Must, M.D. [facility] Proc. Date: 10/13/01 Admit Date:  10/13/2001 Discharge Date: 10/13/2001   CC:         Milus Mallick. Lodema Hong, M.D.  Luis Abed, M.D. [facility] Cardiac Cath Lab   Cardiac Catheterization  PROCEDURE PERFORMED: 1. Left heart catheterization with coronary angiography and left    ventriculography. 2. Percutaneous transluminal coronary angioplasty with stent placement in the    distal right coronary artery.  INDICATION:  Ms. Kring is a 45 year old woman with history of systemic lupus erythematosus, diabetes mellitus and hypertension.  She presented to the [facility] Emergency Room with several hours of 10/10 chest pain.  Initial EKG showed mild inferior ST segment changes.  Followup EKG showed 1 mm of ST segment elevation in the inferior leads as well as lead V6.  She was started on heparin and Integrilin and transferred emergently to [facility] for cardiac catheterization.  CATHETERIZATION PROCEDURAL NOTE:  A 6-French sheath was placed in the right femoral vein and 7-French sheath in the right femoral artery.  Diagnostic catheterization was performed with standard Judkins 6-French catheters. Contrast was Omnipaque.  There were no complications.  RESULTS: Hemodynamics:  Left ventricular pressure 122/12; aortic pressure 122/90. There was no aortic valve gradient.  Left ventriculogram:  There is mild akinesis of the inferior wall and moderate hypokinesis of the anterolateral wall.  Ejection fraction calculated at 44%. There is no mitral regurgitation.  Coronary arteriography (codominant): 1. Left main has a distal 20% stenosis. 2. Left anterior descending artery has a mid 20% stenosis.  The LAD gives rise    to a single  small diagonal branch. 3. Left circumflex is a codominant vessel but relatively small.  It does give    rise to a very large branching ramus intermediate.  There is a 20% stenosis    in the medial branch of the ramus intermediate.  The distal circumflex    gives rise to a small OM-1, small-to-normal-sized first posterolateral    branch and a small second posterolateral branch. 4. Right coronary artery is a codominant vessel which is relatively large in    size.  There is less than 20% stenosis in the proximal vessel.  In the    distal vessel at the acute margin, there is a 90% stenosis with an    irregular border suggestive of a rupture plaque and possible thrombus.  The    distal right coronary artery ends with a very large bifurcating posterior    descending artery.  IMPRESSIONS: 1. Mild-to-moderate decrease in left ventricular systolic function. 2. One-vessel coronary artery disease as described.  The culprit lesion for    the patients acute coronary syndrome is the distal right coronary artery.  PLAN:  Percutaneous intervention to the right coronary artery, see below.  PERCUTANEOUS TRANSLUMINAL CORONARY ANGIOPLASTY PROCEDURAL NOTE:  Following completion of the diagnostic catheterization, we proceeded directly with percutaneous coronary intervention.  The patient was enrolled in the AIMI study and randomized to conventional therapy.  She had been started on heparin and Integrilin prior to coming to the catheterization laboratory.  She did receive a second bolus of Integrilin in the cath lab; no additional heparin was required.  We used a 7-French JR4 guiding catheter  with sideholes and a BMW wire.  We directly deployed a 3.5 x 15.0-mm Zeta stent at a deployment pressure of 12 atmospheres.  We then post-dilated this stent with a 3.5 x 15.0-mm Quantum balloon inflated to 18 atmospheres.  Intermittent doses of nitroglycerin and Verapamil were administered to maintain  antegrade perfusion.  Final angiographic images revealed patency of the distal right coronary artery with less than 10% residual stenosis and TIMI-3 flow.  COMPLICATIONS:  None.  RESULTS:  Successful percutaneous transluminal coronary angioplasty with stent placement in the distal right coronary artery, reducing a 90% stenosis to less than 10% residual with TIMI-3 flow.  PLAN:  Integrilin will be continued for 24 hours.  Plavix will be administered for a recommended 6 to 12 months; patient will also be treated with aspirin indefinitely. Dictated by:   Daisey Must, M.D. [facility] Attending Physician:  Annamarie Dawley DD:  10/13/01 TD:  10/13/01 Job: [id number]

## 2011-01-03 NOTE — Assessment & Plan Note (Signed)
[facility]                           ELECTROPHYSIOLOGY OFFICE NOTE   RETHER, RISON                     MRN:          [id number]  DATE:06/15/2006                            DOB:          [birth year]    DEFIBRILLATOR NOTE:  [name] was seen today in the clinic on June 15, 2006, for follow-up of her Medtronic model number 7232 Maximo.  Date of  implant was Jan 02, 2006, for cardiomyopathy.  On interrogation of her  device today, her battery voltage is 3.20 with a charge time of 8.20  seconds.  R waves measured 11.3 mV with a ventricular pacing threshold of 1  V at 0.2 msec and a ventricular lead impedance of 496.  Shock impedance was  57.  There was one nonsustained episode since last interrogation.  [name] does have one of the alert leads and was reprogrammed according to  the protocol.  She will continue with her [facility] transmissions at 3, 6 and  9 months with a return office visit in one year with Dr. Graciela Husbands.     ______________________________  Altha Harm, LPN    ______________________________  Duke Salvia, MD, [facility]   PO/MedQ  DD: 06/15/2006  DT: 06/16/2006  Job #: [id number]

## 2011-01-03 NOTE — Procedures (Signed)
[name], [name]                        ACCOUNT NO.:  [id number]   MEDICAL RECORD NO.:  [id number]                   PATIENT TYPE:  EMS   LOCATION:  ED                                   FACILITY:  [facility]   PHYSICIAN:  Edward L. Juanetta Gosling, M.D.             DATE OF BIRTH:  01-25-1966   DATE OF PROCEDURE:  DATE OF DISCHARGE:  08/14/2003                                EKG INTERPRETATION   FINDINGS:  1. The rhythm is sinus rhythm with a rate in the 70's.  2. There are fairly deep Q waves inferiorly which could indicate a previous     inferior myocardial infarction.  3. T-wave abnormalities are seen laterally which could indicate ischemia but     are nonspecific.  4. Clinical correlation is suggested.   IMPRESSION:  Abnormal electrocardiogram.      ___________________________________________                                            Oneal Deputy. Juanetta Gosling, M.D.   ELH/MEDQ  D:  08/21/2003  T:  08/21/2003  Job:  [id number]

## 2011-01-03 NOTE — Discharge Summary (Signed)
[place]  Patient:    [name], [name] Visit Number: [id number] MRN: [id number]          Service Type: MED Location: [phone] Attending Physician:  Daisey Must Dictated by:   [name], P.A.-C. [facility] Admit Date:  10/13/2001 Discharge Date: 10/16/2001   CC:         [name], M.D. - [place], [place]   Discharge Summary  HISTORY OF PRESENT ILLNESS:  Ms. Podgurski is a 45 year old African-American female with systemic lupus, ongoing cigarette use, a history of hypertension, diabetes mellitus, who presented with an acute DMI at [facility], where she had ST elevation inferiorly, and was begun on heparin, aspirin, nitroglycerin, and Integrilin.  HOSPITAL COURSE:  She was transferred to [facility] where she was taken urgently to the cardiac catheterization by Dr. Loraine Leriche Pulsipher, and found to have a 90% proximal RCA that was stented to less than 10% without complications.  She has some 20% lesions in her LAD and circumflex systems, but otherwise there were no critical lesions noted.  LV function was 44% with moderate anterolateral hypokinesis.  Thereafter she did well, but did have some recurrent chest pain briefly.  She was kept an extra day and observed, with no recurrent symptoms.  At the time of discharge she was ambulatory and doing well.  ACCESSORY CLINICAL FINDINGS:  CPK peaked at 904 with MB of 267.  Hemoglobin 9.6 with a hematocrit of 29.1, white count 4400.  Blood type and group was B-positive.  Electrolytes and renal profile were normal.  BUN was 4 with a creatinine of 0.6.  Total cholesterol was 147 with triglyceride of 113, HDL 28, LDL 96.  TSH was 0.506.  DISCHARGE DIAGNOSES 1. Acute inferior wall myocardial infarction, treated with urgent stent    of a 90% proximal right coronary artery lesion, to less than 10%    without complications.  No significant disease in the left coronary  system. 2. Systemic lupus erythematosus - followed at [facility]. 3. Controlled hypertension. 4. History of diabetes mellitus. 5. Ongoing cigarette use.  DISCHARGE MEDICATIONS 1. Plavix 75 mg q.d. for six months. 2. Lopressor 25 mg b.i.d. 3. Nitroglycerin p.r.n. 4. Aspirin one q.d. 5. Prednisone 15 mg q.d. 6. Protonix 40 mg q.d. 7. Robaxin 750 mg p.r.n. 8. CellCept. 9. Lipitor 20 mg q.d. 10. Xanax 0.5 mg q.h.s. p.r.n. 11. Plaquenil 100 mg q.d. 12. K-Dur 20 mEq b.i.d. 13. Keflex p.r.n.  FOLLOWUP:  She will follow up with [name], P.A.-C. on November 01, 2001, at 10 a.m.  She will have followup with Dr. Syliva [name]. Dictated by:   [name], P.A.-C. [facility] Attending Physician:  Daisey Must DD:  10/16/01 TD:  10/16/01 Job: [id number]

## 2011-01-03 NOTE — Discharge Summary (Signed)
[name], BORROMEO              ACCOUNT NO.:  [id number]   MEDICAL RECORD NO.:  [id number]          PATIENT TYPE:  INP   LOCATION:  2035                         FACILITY:  [facility]   PHYSICIAN:  Duke Salvia, M.D.  DATE OF BIRTH:  29-Jul-1966   DATE OF ADMISSION:  11/04/2005  DATE OF DISCHARGE:  11/07/2005                           DISCHARGE SUMMARY - REFERRING   BRIEF HISTORY:  [name] is a 45 year old female who was admitted by Dr.  Jens Som with chest tightness which occurred during a stressful argument on  the evening prior to admission.  Her discomfort increased with inspiration,  radiated to her back, associated with nausea, vomiting and shortness of  breath.  She denied diaphoresis.  In the ER, her discomfort was relieved  with a GI cocktail and cardiology was asked to evaluate.   PAST MEDICAL HISTORY:  1.  Stenting of the RCA and 2003.  2.  Non-Q-wave myocardial infarction in November 2004; medical treatment.  3.  Diabetes.  4.  Hypertension.  5.  Lupus.  6.  Nonischemic cardiomyopathy.  7.  Hyperlipidemia.  8.  Hypertension.  9.  Tobacco use.   LABORATORY:  Admission H&H was 11.0 and 32.7, normal indices, platelets 211,  WBCs 3.4, PTT 45, PT 14.0.  Sodium 142, potassium 4.1, BUN 5, creatinine  0.6, glucose 99.  Initial CK was 178 with MB of 23.4, relative index 13.1,  troponin 1.68.  Subsequent enzymes were declining.  EKG showed normal sinus  rhythm, early R-wave, inferior Q-waves, nonspecific ST-T wave changes.  No  acute changes.  Echocardiogram showed an EF of 35% with mid distal inferior  and inferoseptal segment akinesis, virtual akinesis in the apex, moderate  hypokinesis of the septal wall, mild MR.   HOSPITAL COURSE:  [name] was admitted to [facility].  She was  seen by case management to assist with future needs.  On November 05, 2005, she  underwent cardiac catheterization by Dr. Gala Romney.  EF was 20% with  inferior akinesis.  Her stent  in the RCA was patent.  She had a distal 30%  and 40% RCA lesion to the stent, and a 40% proximal RCA lesion.  Dr.  Gala Romney recommended continued medical treatment.  It was felt that she  experience a non-ST segment elevation MI based on her blood work.  Medications were adjusted.  EP was consulted on November 06, 2005 for possible  ICD.  It was felt that she could be discharged home after Dr. Graciela Husbands saw the  patient.  Dr. Graciela Husbands felt that she should have ICEP plus or minus CRT in the  next week or two, continue her home medications, and he was sepsis up in the  office.   DISCHARGE DIAGNOSES:  1.  Non-Q-wave myocardial infarction.  2.  Nonobstructive coronary artery disease.  3.  Dilated cardiomyopathy.  4.  Ejection fraction is out of proportion to her existing coronary disease;      history as previously.   DISPOSITION:  She is discharged home.  Asked to maintain a low salt, fat and  cholesterol diet.  Activity and  wound care per supplemental sheet.  Her home  medications have not changed.  She was asked to bring all  medications to all future follow-up appointment.  She will follow up with  Dr. Dorethea Clan in [place] on November 19, 2005 at 1:30, Dr. Lodema Hong as needed,  Dr. _________ at [facility], and Dr. Lanell Matar in regards  to her lupus.      [name], P.A. [facility]    ______________________________  Duke Salvia, M.D.    EW/MEDQ  D:  12/31/2005  T:  12/31/2005  Job:  [id number]

## 2011-01-22 ENCOUNTER — Encounter: Payer: Self-pay | Admitting: Family Medicine

## 2011-01-22 ENCOUNTER — Ambulatory Visit (INDEPENDENT_AMBULATORY_CARE_PROVIDER_SITE_OTHER): Payer: Medicare Other | Admitting: Family Medicine

## 2011-01-22 ENCOUNTER — Other Ambulatory Visit: Payer: Self-pay | Admitting: Family Medicine

## 2011-01-22 ENCOUNTER — Telehealth: Payer: Self-pay | Admitting: Family Medicine

## 2011-01-22 VITALS — BP 120/80 | HR 91 | Resp 16 | Ht 66.0 in | Wt 212.1 lb

## 2011-01-22 DIAGNOSIS — I10 Essential (primary) hypertension: Secondary | ICD-10-CM

## 2011-01-22 DIAGNOSIS — J029 Acute pharyngitis, unspecified: Secondary | ICD-10-CM

## 2011-01-22 DIAGNOSIS — J209 Acute bronchitis, unspecified: Secondary | ICD-10-CM

## 2011-01-22 DIAGNOSIS — F17200 Nicotine dependence, unspecified, uncomplicated: Secondary | ICD-10-CM

## 2011-01-22 DIAGNOSIS — Z1231 Encounter for screening mammogram for malignant neoplasm of breast: Secondary | ICD-10-CM

## 2011-01-22 DIAGNOSIS — E785 Hyperlipidemia, unspecified: Secondary | ICD-10-CM

## 2011-01-22 DIAGNOSIS — E119 Type 2 diabetes mellitus without complications: Secondary | ICD-10-CM

## 2011-01-22 MED ORDER — METFORMIN HCL 500 MG PO TABS: 500.0000 mg | ORAL_TABLET | Freq: Two times a day (BID) | ORAL | Status: DC

## 2011-01-22 MED ORDER — SULFAMETHOXAZOLE-TRIMETHOPRIM 800-160 MG PO TABS: 1.0000 | ORAL_TABLET | Freq: Two times a day (BID) | ORAL | Status: AC

## 2011-01-22 MED ORDER — FLUCONAZOLE 100 MG PO TABS: ORAL_TABLET | ORAL | Status: AC

## 2011-01-22 MED ORDER — BENZONATATE 100 MG PO CAPS: 100.0000 mg | ORAL_CAPSULE | Freq: Three times a day (TID) | ORAL | Status: AC | PRN

## 2011-01-22 NOTE — Patient Instructions (Signed)
cPE in 4 months.  You are being treated for acute bronchitis , med is sent in.  Fasting lipid eGFR, lipid and hBA1c asap.  Mammogram due 6/15 or after we will schedule   It is important that you exercise regularly at least 30 minutes 5 times a week. If you develop chest pain, have severe difficulty breathing, or feel very tired, stop exercising immediately and seek medical attention  A healthy diet is rich in fruit, vegetables and whole grains. Poultry fish, nuts and beans are a healthy choice for protein rather then red meat. A low sodium diet and drinking 64 ounces of water daily is generally recommended. Oils and sweet should be limited. Carbohydrates especially for those who are diabetic or overweight, should be limited to 34-45 gram per meal. It is important to eat on a regular schedule, at least 3 times daily. Snacks should be primarily fruits, vegetables or nuts.   I am starting you back on metformin twice daily   Please think about quitting smoking.  This is very important for your health.  Consider setting a quit date, then cutting back or switching brands to prepare to stop.  Also think of the money you will save every day by not smoking.  Quick Tips to Quit Smoking: Fix a date i.e. keep a date in mind from when you would not touch a tobacco product to smoke  Keep yourself busy and block your mind with work loads or reading books or watching movies in malls where smoking is not allowed  Vanish off the things which reminds you about smoking for example match box, or your favorite lighter, or the pipe you used for smoking, or your favorite jeans and shirt with which you used to enjoy smoking, or the club where you used to do smoking  Try to avoid certain people places and incidences where and with whom smoking is a common factor to add on  Praise yourself with some token gifts from the money you saved by stopping smoking  Anti Smoking teams are there to help you. Join their programs    Anti-smoking Gums are there in many medical shops. Try them to quit smoking   Side-effects of Smoking: Disease caused by smoking cigarettes are emphysema, bronchitis, heart failures  Premature death  Cancer is the major side effect of smoking  Heart attacks and strokes are the quick effects of smoking causing sudden death  Some smokers lives end up with limbs amputated  Breathing problem or fast breathing is another side effect of smoking  Due to more intakes of smokes, carbon mono-oxide goes into your brain and other muscles of the body which leads to swelling of the veins and blockage to the air passage to lungs  Carbon monoxide blocks blood vessels which leads to blockage in the flow of blood to different major body organs like heart lungs and thus leads to attacks and deaths  During pregnancy smoking is very harmful and leads to premature birth of the infant, spontaneous abortions, low weight of the infant during birth  Fat depositions to narrow and blocked blood vessels causing heart attacks  In many cases cigarette smoking caused infertility in men

## 2011-01-23 NOTE — Telephone Encounter (Signed)
Patient is aware of this appointment

## 2011-01-27 ENCOUNTER — Other Ambulatory Visit: Payer: Self-pay

## 2011-01-27 ENCOUNTER — Ambulatory Visit (INDEPENDENT_AMBULATORY_CARE_PROVIDER_SITE_OTHER): Payer: Medicare Other | Admitting: *Deleted

## 2011-01-27 DIAGNOSIS — I428 Other cardiomyopathies: Secondary | ICD-10-CM

## 2011-01-29 LAB — HEMOGLOBIN A1C
Hgb A1c MFr Bld: 6.3 % — ABNORMAL HIGH (ref <5.7)
Mean Plasma Glucose: 134 mg/dL — ABNORMAL HIGH (ref <117)

## 2011-01-29 LAB — LIPID PANEL
LDL Cholesterol: 116 mg/dL — ABNORMAL HIGH (ref 0–99)
Total CHOL/HDL Ratio: 5.5 Ratio
VLDL: 20 mg/dL (ref 0–40)

## 2011-01-30 LAB — COMPLETE METABOLIC PANEL WITH GFR
ALT: 25 U/L (ref 0–35)
Albumin: 4.1 g/dL (ref 3.5–5.2)
Alkaline Phosphatase: 87 U/L (ref 39–117)
GFR, Est Non African American: >60 mL/min (ref >60)
Glucose, Bld: 105 mg/dL — ABNORMAL HIGH (ref 70–99)
Potassium: 4.6 mEq/L (ref 3.5–5.3)
Sodium: 139 mEq/L (ref 135–145)
Total Protein: 8.2 g/dL (ref 6.0–8.3)

## 2011-01-30 NOTE — Progress Notes (Signed)
icd remote check  

## 2011-01-31 ENCOUNTER — Ambulatory Visit
Admission: RE | Admit: 2011-01-31 | Discharge: 2011-01-31 | Disposition: A | Discharge status: Home / Self Care | Payer: Medicare Other | Source: Ambulatory Visit | Attending: Family Medicine | Admitting: Family Medicine

## 2011-01-31 ENCOUNTER — Encounter: Payer: Self-pay | Admitting: *Deleted

## 2011-01-31 DIAGNOSIS — Z1231 Encounter for screening mammogram for malignant neoplasm of breast: Secondary | ICD-10-CM

## 2011-01-31 MED ORDER — ATORVASTATIN CALCIUM 80 MG PO TABS: 80.0000 mg | ORAL_TABLET | Freq: Every day | ORAL | Status: DC

## 2011-01-31 NOTE — Progress Notes (Signed)
pls send in and advise lipitor 80mg  one at night  #30 refill 4,

## 2011-01-31 NOTE — Progress Notes (Signed)
Patient aware.

## 2011-02-03 ENCOUNTER — Ambulatory Visit: Payer: Self-pay | Admitting: Family Medicine

## 2011-02-05 NOTE — Assessment & Plan Note (Signed)
Unchanged, counseled to qiut

## 2011-02-05 NOTE — Assessment & Plan Note (Signed)
Antibiotic prescribed 

## 2011-02-05 NOTE — Assessment & Plan Note (Signed)
Controlled, no change in medication  

## 2011-02-05 NOTE — Progress Notes (Signed)
  Subjective:    Patient ID: [name], female    DOB: May 17, 1966, 45 y.o.   MRN: [id number]  HPI 1 week h/o cough productive of thick sputum , shortness of breath and wheezing. Pt denies sinus pressure, sore throat , ear ppain. She has had intermittent chills and fever   Review of Systems  Denies chest pains, palpitations, paroxysmal nocturnal dyspnea, orthopnea and leg swelling Denies abdominal pain, nausea, vomiting,diarrhea or constipation.  Denies rectal bleeding or change in bowel movement. Denies dysuria, frequency, hesitancy or incontinence. Denies joint pain, swelling and limitation in mobility. Denies headaches, seizure, numbness, or tingling. Denies depression, anxiety or insomnia. Denies skin break down or rash.        Objective:   Physical Exam Patient alert and oriented and in no Cardiopulmonary distress.  HEENT: No facial asymmetry, EOMI, no sinus tenderness, TM's clear, Oropharynx pink and moist.  Neck supple no adenopathy.  Chest:decreased air entry , scattered crackles, no wheezes CVS: S1, S2 no murmurs, no S3.  ABD: Soft non tender. Bowel sounds normal.  Ext: No edema  MS: Adequate ROM spine, shoulders, hips and knees.  Skin: Intact, no ulcerations or rash noted.  Psych: Good eye contact, normal affect. Memory intact not anxious or depressed appearing.  CNS: CN 2-12 intact, power, tone and sensation normal throughout.        Assessment & Plan:

## 2011-02-05 NOTE — Assessment & Plan Note (Signed)
Needs updated labs low fat diet discussed and encouraged

## 2011-04-09 ENCOUNTER — Emergency Department (HOSPITAL_COMMUNITY): Payer: Medicare Other

## 2011-04-09 ENCOUNTER — Emergency Department (HOSPITAL_COMMUNITY)
Admission: EM | Admit: 2011-04-09 | Discharge: 2011-04-10 | Disposition: A | Discharge status: Home / Self Care | Payer: Medicare Other | Attending: Emergency Medicine | Admitting: Emergency Medicine

## 2011-04-09 ENCOUNTER — Encounter (HOSPITAL_COMMUNITY): Payer: Self-pay | Admitting: *Deleted

## 2011-04-09 DIAGNOSIS — S29011A Strain of muscle and tendon of front wall of thorax, initial encounter: Secondary | ICD-10-CM

## 2011-04-09 DIAGNOSIS — R059 Cough, unspecified: Secondary | ICD-10-CM | POA: Insufficient documentation

## 2011-04-09 DIAGNOSIS — IMO0002 Reserved for concepts with insufficient information to code with codable children: Secondary | ICD-10-CM | POA: Insufficient documentation

## 2011-04-09 DIAGNOSIS — R109 Unspecified abdominal pain: Secondary | ICD-10-CM | POA: Insufficient documentation

## 2011-04-09 DIAGNOSIS — R05 Cough: Secondary | ICD-10-CM | POA: Insufficient documentation

## 2011-04-09 MED ORDER — DIAZEPAM 2 MG PO TABS
2.0000 mg | ORAL_TABLET | Freq: Once | ORAL | Status: DC
Start: 2011-04-09 — End: 2011-04-09

## 2011-04-09 MED ORDER — ONDANSETRON 4 MG PO TBDP: 4.0000 mg | ORAL_TABLET | Freq: Once | ORAL | Status: DC

## 2011-04-09 MED ORDER — HYDROCODONE-ACETAMINOPHEN 5-325 MG PO TABS
2.0000 | ORAL_TABLET | Freq: Once | ORAL | Status: AC
  Administered 2011-04-09: 2 via ORAL

## 2011-04-09 MED ORDER — ONDANSETRON HCL 4 MG PO TABS
4.0000 mg | ORAL_TABLET | Freq: Once | ORAL | Status: AC
Start: 2011-04-09 — End: 2011-04-09
  Administered 2011-04-09: 4 mg via ORAL

## 2011-04-09 MED ORDER — DIAZEPAM 5 MG PO TABS
5.0000 mg | ORAL_TABLET | Freq: Once | ORAL | Status: AC
  Administered 2011-04-09: 5 mg via ORAL

## 2011-04-09 NOTE — ED Notes (Signed)
Pt c/o right upper quad pain that radiates around to the back with cough, diarrhea and fever that started today

## 2011-04-09 NOTE — ED Provider Notes (Signed)
History     CSN: [id number] Arrival date & time: 04/09/2011 10:13 PM  Chief Complaint  Patient presents with  . Abdominal Pain   HPI Comments: Pt reports increase cough for the past 3 to 4 days. She notes a pain at the right flank area that moves to the mid back. Worse with movement. No hemoptosis. No fever. No excessive exercise or injury.  Patient is a 45 y.o. female presenting with abdominal pain. The history is provided by the patient.  Abdominal Pain The primary symptoms of the illness include abdominal pain. The primary symptoms of the illness do not include shortness of breath, vomiting or dysuria. The current episode started 13 to 24 hours ago. The onset of the illness was gradual. The problem has not changed since onset. Associated with: cough. The patient states that she believes she is currently not pregnant. The patient has not had a change in bowel habit. Additional symptoms associated with the illness include back pain. Symptoms associated with the illness do not include chills, diaphoresis, constipation, hematuria or frequency. Associated medical issues comments: diabetes, hypertension.    Past Medical History  Diagnosis Date  . MI (myocardial infarction)   . CHF (congestive heart failure), NYHA class III   . CAD (coronary artery disease)   . Other and unspecified hyperlipidemia   . Arthritis   . Depression   . GERD (gastroesophageal reflux disease)   . Diabetes mellitus   . Nicotine abuse   . Insomnia   . Migraines   . Lupus (systemic lupus erythematosus)   . Arthritis of knee     Past Surgical History  Procedure Date  . Cholecystectomy   . Abdominal hysterectomy   . Rotator cuff repair 2002  . Defibrillator placed  2009    History reviewed. No pertinent family history.  History  Substance Use Topics  . Smoking status: Current Everyday Smoker -- 0.5 packs/day  . Smokeless tobacco: Not on file  . Alcohol Use: No    OB History    Grav Para Term Preterm  Abortions TAB SAB Ect Mult Living                  Review of Systems  Constitutional: Negative for chills, diaphoresis and activity change.       All ROS Neg except as noted in HPI  HENT: Negative for nosebleeds and neck pain.   Eyes: Negative for photophobia and discharge.  Respiratory: Negative for cough, shortness of breath and wheezing.   Cardiovascular: Negative for chest pain and palpitations.  Gastrointestinal: Positive for abdominal pain. Negative for vomiting, constipation and blood in stool.  Genitourinary: Negative for dysuria, frequency and hematuria.  Musculoskeletal: Positive for back pain. Negative for arthralgias.  Skin: Negative.   Neurological: Negative for dizziness, seizures and speech difficulty.  Psychiatric/Behavioral: Negative for hallucinations and confusion.    Physical Exam  BP 111/72  Pulse 91  Temp(Src) 98.3 F (36.8 C) (Oral)  Resp 20  Ht 5\' 6"  (1.676 m)  Wt 211 lb (95.709 kg)  BMI 34.06 kg/m2  SpO2 100%  Physical Exam  Nursing note and vitals reviewed. Constitutional: She is oriented to person, place, and time. She appears well-developed and well-nourished.  Non-toxic appearance.  HENT:  Head: Normocephalic.  Right Ear: Tympanic membrane and external ear normal.  Left Ear: Tympanic membrane and external ear normal.  Eyes: EOM and lids are normal. Pupils are equal, round, and reactive to light.  Neck: Normal range of motion. Neck supple.  Carotid bruit is not present.  Cardiovascular: Normal rate, regular rhythm, normal heart sounds, intact distal pulses and normal pulses.   Pulmonary/Chest: Breath sounds normal. No respiratory distress.  Abdominal: Soft. Bowel sounds are normal. There is no tenderness. There is no guarding.  Musculoskeletal: Normal range of motion.       Arms:      Pain with movement of the right flank area.  Lymphadenopathy:       Head (right side): No submandibular adenopathy present.       Head (left side): No  submandibular adenopathy present.    She has no cervical adenopathy.  Neurological: She is alert and oriented to person, place, and time. She has normal strength. No cranial nerve deficit or sensory deficit.  Skin: Skin is warm and dry.  Psychiatric: She has a normal mood and affect. Her speech is normal.    ED Course Pain improving after pain meds.  Procedures Plan Ice pack, robaxin, Norco. Practice deep breathing. See Dr Lodema Hong for recheck in the office. MDM I have reviewed nursing notes, vital signs, and all appropriate lab and imaging results for this patient. R/o occult rib fx. R/o pneumonia or lung related problem.      Kathie Dike, [place] 04/10/11 0005  Medical screening examination/treatment/procedure(s) were performed by non-physician practitioner and as supervising physician I was immediately available for consultation/collaboration.  Nicoletta Dress. Colon Branch, MD 04/11/11 0500

## 2011-04-10 MED ORDER — HYDROCODONE-ACETAMINOPHEN 5-325 MG PO TABS: ORAL_TABLET | ORAL | Status: DC

## 2011-04-10 MED ORDER — METHOCARBAMOL 500 MG PO TABS: ORAL_TABLET | ORAL | Status: DC

## 2011-04-10 NOTE — ED Notes (Signed)
Pt self ambulated to the rest room with a steady gait.

## 2011-04-10 NOTE — Progress Notes (Signed)
O2 sat wnl, I can reproduce the right flank pain with palpation and with ROM of the right flank. Chest xray is negative. Discussed plan with pt, and need to return if any changes or problem.

## 2011-04-13 ENCOUNTER — Inpatient Hospital Stay (INDEPENDENT_AMBULATORY_CARE_PROVIDER_SITE_OTHER)
Admission: RE | Admit: 2011-04-13 | Discharge: 2011-04-13 | Disposition: A | Discharge status: Home / Self Care | Payer: Medicare Other | Source: Ambulatory Visit | Attending: Emergency Medicine | Admitting: Emergency Medicine

## 2011-04-13 ENCOUNTER — Ambulatory Visit (INDEPENDENT_AMBULATORY_CARE_PROVIDER_SITE_OTHER): Payer: Medicare Other

## 2011-04-13 DIAGNOSIS — J40 Bronchitis, not specified as acute or chronic: Secondary | ICD-10-CM

## 2011-04-14 ENCOUNTER — Telehealth: Payer: Self-pay | Admitting: Family Medicine

## 2011-04-14 DIAGNOSIS — E119 Type 2 diabetes mellitus without complications: Secondary | ICD-10-CM

## 2011-04-14 NOTE — Telephone Encounter (Signed)
Advise and send in fluconazole 150mg  #1 please, also advise if no ov in 4 months , needs one and will need HBA1C before visit

## 2011-04-15 MED ORDER — FLUCONAZOLE 150 MG PO TABS: 150.0000 mg | ORAL_TABLET | Freq: Once | ORAL | Status: DC

## 2011-04-15 NOTE — Telephone Encounter (Signed)
Patient aware Follow up labs ordered Med sent

## 2011-04-16 ENCOUNTER — Encounter: Payer: Self-pay | Admitting: Family Medicine

## 2011-04-17 ENCOUNTER — Telehealth: Payer: Self-pay | Admitting: Family Medicine

## 2011-04-17 ENCOUNTER — Encounter: Payer: Self-pay | Admitting: Family Medicine

## 2011-04-17 ENCOUNTER — Ambulatory Visit (INDEPENDENT_AMBULATORY_CARE_PROVIDER_SITE_OTHER): Payer: Medicare Other | Admitting: Family Medicine

## 2011-04-17 VITALS — BP 114/82 | HR 96 | Temp 98.5°F | Resp 16 | Ht 66.0 in | Wt 213.4 lb

## 2011-04-17 DIAGNOSIS — F17200 Nicotine dependence, unspecified, uncomplicated: Secondary | ICD-10-CM

## 2011-04-17 DIAGNOSIS — R079 Chest pain, unspecified: Secondary | ICD-10-CM | POA: Insufficient documentation

## 2011-04-17 DIAGNOSIS — J329 Chronic sinusitis, unspecified: Secondary | ICD-10-CM

## 2011-04-17 DIAGNOSIS — E669 Obesity, unspecified: Secondary | ICD-10-CM

## 2011-04-17 DIAGNOSIS — I10 Essential (primary) hypertension: Secondary | ICD-10-CM

## 2011-04-17 DIAGNOSIS — J449 Chronic obstructive pulmonary disease, unspecified: Secondary | ICD-10-CM

## 2011-04-17 DIAGNOSIS — J209 Acute bronchitis, unspecified: Secondary | ICD-10-CM

## 2011-04-17 DIAGNOSIS — J40 Bronchitis, not specified as acute or chronic: Secondary | ICD-10-CM

## 2011-04-17 MED ORDER — CHLORPHENIRAMINE-HYDROCODONE 8-10 MG/5ML PO LQCR: 5.0000 mL | Freq: Two times a day (BID) | ORAL | Status: DC | PRN

## 2011-04-17 MED ORDER — IPRATROPIUM BROMIDE 0.02 % IN SOLN
0.5000 mg | Freq: Once | RESPIRATORY_TRACT | Status: AC
  Administered 2011-04-17: 0.5 mg via RESPIRATORY_TRACT

## 2011-04-17 MED ORDER — ALBUTEROL SULFATE (2.5 MG/3ML) 0.083% IN NEBU
2.5000 mg | INHALATION_SOLUTION | Freq: Once | RESPIRATORY_TRACT | Status: AC
  Administered 2011-04-17: 2.5 mg via RESPIRATORY_TRACT

## 2011-04-17 MED ORDER — CLARITHROMYCIN 500 MG PO TABS: 500.0000 mg | ORAL_TABLET | Freq: Two times a day (BID) | ORAL | Status: AC

## 2011-04-17 MED ORDER — CEFTRIAXONE SODIUM 500 MG IJ SOLR
500.0000 mg | Freq: Once | INTRAMUSCULAR | Status: AC
  Administered 2011-04-17: 500 mg via INTRAMUSCULAR

## 2011-04-17 MED ORDER — PREDNISONE (PAK) 5 MG PO TABS: 5.0000 mg | ORAL_TABLET | ORAL | Status: DC

## 2011-04-17 MED ORDER — FLUCONAZOLE 150 MG PO TABS: 150.0000 mg | ORAL_TABLET | Freq: Once | ORAL | Status: AC

## 2011-04-17 MED ORDER — KETOROLAC TROMETHAMINE 60 MG/2ML IM SOLN
60.0000 mg | Freq: Once | INTRAMUSCULAR | Status: AC
  Administered 2011-04-17: 60 mg via INTRAMUSCULAR

## 2011-04-17 MED ORDER — METHYLPREDNISOLONE ACETATE 80 MG/ML IJ SUSP
80.0000 mg | Freq: Once | INTRAMUSCULAR | Status: AC
  Administered 2011-04-17: 80 mg via INTRAMUSCULAR

## 2011-04-17 NOTE — Assessment & Plan Note (Signed)
toradol administered in theoffice 

## 2011-04-17 NOTE — Patient Instructions (Addendum)
F/u as before.  You will get an injection of rocephin and depomedrol, and additional medication is being sent to your pharmacy. You will get an injection for chest wall pain. You will get a breathing treatment  LABWORK  NEEDS TO BE DONE BETWEEN 3 TO 7 DAYS BEFORE YOUR NEXT SCEDULED  VISIT.  THIS WILL IMPROVE THE QUALITY OF YOUR CARE.   Please think about quitting smoking.  This is very important for your health.  Consider setting a quit date, then cutting back or switching brands to prepare to stop.  Also think of the money you will save every day by not smoking.  Quick Tips to Quit Smoking: Fix a date i.e. keep a date in mind from when you would not touch a tobacco product to smoke  Keep yourself busy and block your mind with work loads or reading books or watching movies in malls where smoking is not allowed  Vanish off the things which reminds you about smoking for example match box, or your favorite lighter, or the pipe you used for smoking, or your favorite jeans and shirt with which you used to enjoy smoking, or the club where you used to do smoking  Try to avoid certain people places and incidences where and with whom smoking is a common factor to add on  Praise yourself with some token gifts from the money you saved by stopping smoking  Anti Smoking teams are there to help you. Join their programs  Anti-smoking Gums are there in many medical shops. Try them to quit smoking   Side-effects of Smoking: Disease caused by smoking cigarettes are emphysema, bronchitis, heart failures  Premature death  Cancer is the major side effect of smoking  Heart attacks and strokes are the quick effects of smoking causing sudden death  Some smokers lives end up with limbs amputated  Breathing problem or fast breathing is another side effect of smoking  Due to more intakes of smokes, carbon mono-oxide goes into your brain and other muscles of the body which leads to swelling of the veins and  blockage to the air passage to lungs  Carbon monoxide blocks blood vessels which leads to blockage in the flow of blood to different major body organs like heart lungs and thus leads to attacks and deaths  During pregnancy smoking is very harmful and leads to premature birth of the infant, spontaneous abortions, low weight of the infant during birth  Fat depositions to narrow and blocked blood vessels causing heart attacks  In many cases cigarette smoking caused infertility in men

## 2011-04-17 NOTE — Assessment & Plan Note (Signed)
Deteriorated. Patient re-educated about  the importance of commitment to a  minimum of 150 minutes of exercise per week. The importance of healthy food choices with portion control discussed. Encouraged to start a food diary, count calories and to consider  joining a support group. Sample diet sheets offered. Goals set by the patient for the next several months.    

## 2011-04-17 NOTE — Assessment & Plan Note (Signed)
Unchanged , cessation counseling done 

## 2011-04-17 NOTE — Progress Notes (Signed)
  Subjective:    Patient ID: [name], female    DOB: 06/02/1966, 45 y.o.   MRN: [id number]  HPI 10 day h/o chest congestion with uncontroled cough, intermittent chills, generalized body aches, espescially right lower chest, no urinary symptoms.Had vomiting and diarreah for 2 days which is better. 10 day sinus congestion,clear drainage has had 2 visits to Ed and urgent care, just finished z pack, and finishing prednisone.    Review of Systems See HPI Denies palpitations and leg swelling. Right posterior chest pain , lower aggravated by cough Denies abdominal pain, nausea, vomiting,diarrhea or constipation.   Denies dysuria, frequency, hesitancy or incontinence. Denies joint pain, swelling and limitation in mobility. Denies headaches, seizures, numbness, or tingling. Denies depression, anxiety or insomnia. Denies skin break down or rash.        Objective:   Physical Exam Patient alert and oriented and in no cardiopulmonary distress.Ill appearing  HEENT: No facial asymmetry, EOMI,maxillary sinus tenderness,  oropharynx pink and moist.  Neck anterior cervical  adenopathy.  Chest:Decreased air entry, bilateral crackles and few wheezes  CVS: S1, S2 no murmurs, no S3.  ABD: Soft non tender. Bowel sounds normal.  Ext: No edema  MS: Adequate ROM spine, shoulders, hips and knees.Tender to palpation of right posterior chest, inferior aspect  Skin: Intact, no ulcerations or rash noted.  Psych: Good eye contact, normal affect. Memory intact not anxious or depressed appearing.  CNS: CN 2-12 intact, power, tone and sensation normal throughout.        Assessment & Plan:

## 2011-04-17 NOTE — Assessment & Plan Note (Signed)
Antibiotics, steroids and neb treatment in office and med prescribed

## 2011-04-17 NOTE — Assessment & Plan Note (Signed)
Controlled, no change in medication  

## 2011-04-17 NOTE — Telephone Encounter (Signed)
Called in directly to scott at [facility]

## 2011-05-20 NOTE — Progress Notes (Signed)
  Medical screening examination/treatment/procedure(s) were performed by non-physician practitioner and as supervising physician I was immediately available for consultation/collaboration.     

## 2011-05-22 ENCOUNTER — Encounter: Payer: Self-pay | Admitting: Family Medicine

## 2011-05-23 ENCOUNTER — Encounter: Payer: Self-pay | Admitting: Family Medicine

## 2011-05-23 ENCOUNTER — Other Ambulatory Visit: Payer: Self-pay | Admitting: Family Medicine

## 2011-05-26 ENCOUNTER — Encounter: Payer: Medicare Other | Admitting: Family Medicine

## 2011-05-27 ENCOUNTER — Telehealth: Payer: Self-pay | Admitting: *Deleted

## 2011-05-27 ENCOUNTER — Ambulatory Visit (INDEPENDENT_AMBULATORY_CARE_PROVIDER_SITE_OTHER): Payer: Medicare Other | Admitting: Internal Medicine

## 2011-05-27 ENCOUNTER — Encounter: Payer: Self-pay | Admitting: Internal Medicine

## 2011-05-27 ENCOUNTER — Other Ambulatory Visit: Payer: Self-pay | Admitting: *Deleted

## 2011-05-27 VITALS — BP 98/74 | HR 79 | Ht 66.0 in | Wt 211.0 lb

## 2011-05-27 DIAGNOSIS — T82198A Other mechanical complication of other cardiac electronic device, initial encounter: Secondary | ICD-10-CM | POA: Insufficient documentation

## 2011-05-27 DIAGNOSIS — I10 Essential (primary) hypertension: Secondary | ICD-10-CM

## 2011-05-27 DIAGNOSIS — Z95810 Presence of automatic (implantable) cardiac defibrillator: Secondary | ICD-10-CM

## 2011-05-27 DIAGNOSIS — I2589 Other forms of chronic ischemic heart disease: Secondary | ICD-10-CM

## 2011-05-27 DIAGNOSIS — I4901 Ventricular fibrillation: Secondary | ICD-10-CM

## 2011-05-27 DIAGNOSIS — I255 Ischemic cardiomyopathy: Secondary | ICD-10-CM

## 2011-05-27 LAB — POCT URINALYSIS DIP (DEVICE)
Ketones, ur: NEGATIVE
Operator id: 247071
Protein, ur: NEGATIVE
Specific Gravity, Urine: 1.02
Urobilinogen, UA: 0.2
pH: 6.5

## 2011-05-27 MED ORDER — LISINOPRIL 2.5 MG PO TABS: 2.5000 mg | ORAL_TABLET | Freq: Every day | ORAL | Status: DC

## 2011-05-27 MED ORDER — METOPROLOL TARTRATE 25 MG PO TABS: 25.0000 mg | ORAL_TABLET | Freq: Two times a day (BID) | ORAL | Status: DC

## 2011-05-27 NOTE — Assessment & Plan Note (Signed)
As above.

## 2011-05-27 NOTE — Progress Notes (Signed)
Addended by: Reather Laurence A on: 05/27/2011 01:15 PM   Modules accepted: Orders

## 2011-05-27 NOTE — Telephone Encounter (Signed)
close

## 2011-05-27 NOTE — Assessment & Plan Note (Signed)
Instructions were reviewed regarding the risk of any appropriate response to defibrillator lead fracture and alarmed. She is also given a letter for EMS

## 2011-05-27 NOTE — Assessment & Plan Note (Addendum)
Stable on current medication; she should be on an ACE inhibitor. We will begin this. She'll need close follow up of her renal function. This may also offer her protection in the setting of her diabetes.

## 2011-05-27 NOTE — Patient Instructions (Addendum)
Remote monitoring is used to monitor your Pacemaker of ICD from home. This monitoring reduces the number of office visits required to check your device to one time per year. It allows [place] to keep an eye on the functioning of your device to ensure it is working properly. You are scheduled for a device check from home on 08/28/11. You may send your transmission at any time that day. If you have a wireless device, the transmission will be sent automatically. After your physician reviews your transmission, you will receive a postcard with your next transmission date.  Your physician wants you to follow-up in: 1 year with Dr. Graciela Husbands. You will receive a reminder letter in the mail two months in advance. If you don't receive a letter, please call our office to schedule the follow-up  Appointment.  Your physician has recommended you make the following change in your medication: Add Lisinopril 2.5mg  daily and decrease Lopressor (Metoprolol) to 25mg  twice daily.  Your physician recommends that you return for lab work in: 2 weeks (BMET)

## 2011-05-27 NOTE — Assessment & Plan Note (Signed)
The patient's device was interrogated.  The information was reviewed. No changes were made in the programming.    

## 2011-05-27 NOTE — Telephone Encounter (Signed)
Close encounter 

## 2011-05-27 NOTE — Progress Notes (Signed)
  HPI  [name] is a 45 y.o. female is seen in followup for coronary artery disease with prior inferior wall MI treated with stenting.  SHe is status post ICD implantation for primary prevention. She then had subsequent appropriate therapy for ventricular fibrillation. The records also suggest that she has had inappropriate therapy.   The patient denies chest pain, shortness of breath, nocturnal dyspnea, orthopnea or peripheral edema.  There have been no palpitations, lightheadedness or syncope.   Recent nuclear imaging demonstrated ejection fraction of 30% with an infarct and no ischemia.  She ahs completed her associates degree and is working on Administrator, arts in business Her older children hadve lost their jobs and they have moved back in with her(iwth their kids)   Past Medical History  Diagnosis Date  . MI (myocardial infarction)   . CHF (congestive heart failure), NYHA class III   . CAD (coronary artery disease)   . Other and unspecified hyperlipidemia   . Arthritis   . Depression   . GERD (gastroesophageal reflux disease)   . Diabetes mellitus   . Nicotine abuse   . Insomnia   . Migraines   . Lupus (systemic lupus erythematosus)   . Arthritis of knee     Past Surgical History  Procedure Date  . Cholecystectomy   . Abdominal hysterectomy   . Rotator cuff repair 2002  . Defibrillator placed  2009    Current Outpatient Prescriptions  Medication Sig Dispense Refill  . aspirin 81 MG tablet Take 81 mg by mouth daily.        Marland Kitchen atorvastatin (LIPITOR) 80 MG tablet Take 1 tablet (80 mg total) by mouth daily.  30 tablet  3  . HYDROcodone-acetaminophen (NORCO) 5-325 MG per tablet 1 or 2 po q4h prn pain  20 tablet  0  . metFORMIN (GLUCOPHAGE) 500 MG tablet Take 1 tablet (500 mg total) by mouth 2 (two) times daily with a meal.  60 tablet  3  . methocarbamol (ROBAXIN) 500 MG tablet 2 po tid with food for spasm.  20 tablet  0  . metoprolol (LOPRESSOR) 50 MG tablet Take 50 mg  by mouth 2 (two) times daily.          Allergies  Allergen Reactions  . Bee Venom Anaphylaxis  . Cortisone     Review of Systems negative except from HPI and PMH  Physical Exam Well developed and well nourished in no acute distress HENT normal E scleral and icterus clear Neck Supple JVP flat; carotids brisk and full Clear to ausculation Regular rate and rhythm, no murmurs gallops or rub Soft with active bowel sounds No clubbing cyanosis and edema Alert and oriented, grossly normal motor and sensory function Skin Warm and Dry     Assessment and  Plan

## 2011-05-27 NOTE — Assessment & Plan Note (Signed)
No intercurrent ventricular arrhythmia

## 2011-05-27 NOTE — Progress Notes (Signed)
Addended bySherri Rad C on: 05/27/2011 10:13 AM   Modules accepted: Orders

## 2011-06-17 ENCOUNTER — Other Ambulatory Visit: Payer: Self-pay | Admitting: Internal Medicine

## 2011-06-17 ENCOUNTER — Encounter: Payer: Self-pay | Admitting: Family Medicine

## 2011-06-17 ENCOUNTER — Telehealth: Payer: Self-pay | Admitting: Internal Medicine

## 2011-06-17 NOTE — Telephone Encounter (Signed)
New message:  Pt is due to have lab work done.  She just had BMP done.  Is this what you were wanting?  Please call pt. And advise.

## 2011-06-18 LAB — BASIC METABOLIC PANEL
BUN: 6 mg/dL (ref 6–23)
CO2: 25 mEq/L (ref 19–32)
Calcium: 9 mg/dL (ref 8.4–10.5)
Glucose, Bld: 86 mg/dL (ref 70–99)
Sodium: 141 mEq/L (ref 135–145)

## 2011-06-18 NOTE — Telephone Encounter (Signed)
I left a message on the patient's identified voice mail of bmp results and no need for further labs at this time.

## 2011-06-19 ENCOUNTER — Other Ambulatory Visit (HOSPITAL_COMMUNITY)
Admission: RE | Admit: 2011-06-19 | Discharge: 2011-06-19 | Disposition: A | Discharge status: Home / Self Care | Payer: Medicare Other | Source: Ambulatory Visit | Attending: Family Medicine | Admitting: Family Medicine

## 2011-06-19 ENCOUNTER — Ambulatory Visit (INDEPENDENT_AMBULATORY_CARE_PROVIDER_SITE_OTHER): Payer: Medicare Other | Admitting: Family Medicine

## 2011-06-19 ENCOUNTER — Encounter: Payer: Self-pay | Admitting: Family Medicine

## 2011-06-19 VITALS — BP 108/74 | HR 106 | Resp 16 | Ht 66.0 in | Wt 209.0 lb

## 2011-06-19 DIAGNOSIS — F17200 Nicotine dependence, unspecified, uncomplicated: Secondary | ICD-10-CM

## 2011-06-19 DIAGNOSIS — I10 Essential (primary) hypertension: Secondary | ICD-10-CM

## 2011-06-19 DIAGNOSIS — R87810 Cervical high risk human papillomavirus (HPV) DNA test positive: Secondary | ICD-10-CM | POA: Insufficient documentation

## 2011-06-19 DIAGNOSIS — M329 Systemic lupus erythematosus, unspecified: Secondary | ICD-10-CM

## 2011-06-19 DIAGNOSIS — Z23 Encounter for immunization: Secondary | ICD-10-CM

## 2011-06-19 DIAGNOSIS — R5381 Other malaise: Secondary | ICD-10-CM

## 2011-06-19 DIAGNOSIS — R5383 Other fatigue: Secondary | ICD-10-CM

## 2011-06-19 DIAGNOSIS — E669 Obesity, unspecified: Secondary | ICD-10-CM

## 2011-06-19 DIAGNOSIS — Z124 Encounter for screening for malignant neoplasm of cervix: Secondary | ICD-10-CM | POA: Insufficient documentation

## 2011-06-19 DIAGNOSIS — Z0000 Encounter for general adult medical examination without abnormal findings: Secondary | ICD-10-CM

## 2011-06-19 DIAGNOSIS — E785 Hyperlipidemia, unspecified: Secondary | ICD-10-CM

## 2011-06-19 DIAGNOSIS — E119 Type 2 diabetes mellitus without complications: Secondary | ICD-10-CM

## 2011-06-19 DIAGNOSIS — R195 Other fecal abnormalities: Secondary | ICD-10-CM

## 2011-06-19 DIAGNOSIS — Z01419 Encounter for gynecological examination (general) (routine) without abnormal findings: Secondary | ICD-10-CM

## 2011-06-19 LAB — HEMOCCULT GUIAC POC 1CARD (OFFICE)

## 2011-06-19 NOTE — Patient Instructions (Addendum)
F/u in 4 months.  TdaP and flu vaccines today.  CBC and TSH,Fasting CMP and EGFR, HBA1C and lipid [panel in 4 months  You need to stop smoking   Please think about quitting smoking.  This is very important for your health.  Consider setting a quit date, then cutting back or switching brands to prepare to stop.  Also think of the money you will save every day by not smoking.  Quick Tips to Quit Smoking: Fix a date i.e. keep a date in mind from when you would not touch a tobacco product to smoke  Keep yourself busy and block your mind with work loads or reading books or watching movies in malls where smoking is not allowed  Vanish off the things which reminds you about smoking for example match box, or your favorite lighter, or the pipe you used for smoking, or your favorite jeans and shirt with which you used to enjoy smoking, or the club where you used to do smoking  Try to avoid certain people places and incidences where and with whom smoking is a common factor to add on  Praise yourself with some token gifts from the money you saved by stopping smoking  Anti Smoking teams are there to help you. Join their programs  Anti-smoking Gums are there in many medical shops. Try them to quit smoking   Side-effects of Smoking: Disease caused by smoking cigarettes are emphysema, bronchitis, heart failures  Premature death  Cancer is the major side effect of smoking  Heart attacks and strokes are the quick effects of smoking causing sudden death  Some smokers lives end up with limbs amputated  Breathing problem or fast breathing is another side effect of smoking  Due to more intakes of smokes, carbon mono-oxide goes into your brain and other muscles of the body which leads to swelling of the veins and blockage to the air passage to lungs  Carbon monoxide blocks blood vessels which leads to blockage in the flow of blood to different major body organs like heart lungs and thus leads to attacks and  deaths  During pregnancy smoking is very harmful and leads to premature birth of the infant, spontaneous abortions, low weight of the infant during birth  Fat depositions to narrow and blocked blood vessels causing heart attacks  In many cases cigarette smoking caused infertility in men    You are being referred to GI sinnce you have hidden blood in your stools

## 2011-06-21 NOTE — Assessment & Plan Note (Signed)
Unchanged, counseled to quit 

## 2011-06-21 NOTE — Assessment & Plan Note (Signed)
Controlled, no change in medication  

## 2011-06-21 NOTE — Assessment & Plan Note (Signed)
Followed at [facility], stable

## 2011-06-21 NOTE — Assessment & Plan Note (Signed)
Uncontrolled, ldl elevated, goal is 70 or less, repeat labs prior to next visit

## 2011-06-21 NOTE — Progress Notes (Signed)
  Subjective:    Patient ID: [name], female    DOB: May 06, 1966, 45 y.o.   MRN: [id number]  HPI The PT is here for annual examand re-evaluation of chronic medical conditions, medication management and review of any available recent lab and radiology data.  Preventive health is updated, specifically  Cancer screening and Immunization.   The PT denies any adverse reactions to current medications since the last visit.  There are no new concerns.  There are no specific complaints       Review of Systems See HPI Denies recent fever or chills. Denies sinus pressure, nasal congestion, ear pain or sore throat. Denies chest congestion, productive cough or wheezing. Denies chest pains, palpitations and leg swelling Denies abdominal pain, nausea, vomiting,diarrhea or constipation.   Denies dysuria, frequency, hesitancy or incontinence.  Denies headaches C/o increased stress and anxiety from her children, denies depression Denies skin break down or rash.        Objective:   Physical Exam Pleasant obese female, alert and oriented x 3, in no cardio-pulmonary distress. Afebrile. HEENT No facial trauma or asymetry. Sinuses non tender.  EOMI, PERTL, fundoscopic exam no hemorhage or exudate.  External ears normal, tympanic membranes clear. Oropharynx moist, no exudate, poor  dentition. Neck: supple, no adenopathy,JVD or thyromegaly.No bruits.  Chest: Clear to ascultation bilaterally.No crackles or wheezes. Decreased air entry throughout. Non tender to palpation  Breast: No asymetry,no masses. No nipple discharge or inversion. No axillary or supraclavicular adenopathy  Cardiovascular system; Heart sounds normal,  S1 and  S2 ,no S3.  No murmur, or thrill. Apical beat not displaced Peripheral pulses normal.  Abdomen: Soft, non tender, no organomegaly or masses. No bruits. Bowel sounds normal. No guarding, tenderness or rebound.  Rectal:  No mass. Guaiac positive   stool.  GU: External genitalia normal. No lesions. Vaginal canal normal.No discharge. Uterusabsent, no adnexal masses, no adnexal tenderness.  Musculoskeletal exam: Full ROM of spine, hips , shoulders and knees. No deformity ,swelling or crepitus noted. No muscle wasting or atrophy.   Neurologic: Cranial nerves 2 to 12 intact. Power, tone ,sensation and reflexes normal throughout. No disturbance in gait. No tremor.  Skin: Intact, no ulceration, erythema , scaling or rash noted. Pigmentation normal throughout  Psych; Normal mood and affect. Judgement and concentration normal        Assessment & Plan:

## 2011-06-21 NOTE — Assessment & Plan Note (Signed)
Unchanged. Patient re-educated about  the importance of commitment to a  minimum of 150 minutes of exercise per week. The importance of healthy food choices with portion control discussed. Encouraged to start a food diary, count calories and to consider  joining a support group. Sample diet sheets offered. Goals set by the patient for the next several months.    

## 2011-06-21 NOTE — Assessment & Plan Note (Signed)
Referral to GI re this problem

## 2011-06-24 ENCOUNTER — Ambulatory Visit (INDEPENDENT_AMBULATORY_CARE_PROVIDER_SITE_OTHER): Payer: Medicare Other | Admitting: Urgent Care

## 2011-06-24 ENCOUNTER — Other Ambulatory Visit: Payer: Self-pay | Admitting: Gastroenterology

## 2011-06-24 ENCOUNTER — Encounter: Payer: Self-pay | Admitting: Urgent Care

## 2011-06-24 DIAGNOSIS — K921 Melena: Secondary | ICD-10-CM

## 2011-06-24 DIAGNOSIS — K219 Gastro-esophageal reflux disease without esophagitis: Secondary | ICD-10-CM

## 2011-06-24 DIAGNOSIS — K5900 Constipation, unspecified: Secondary | ICD-10-CM | POA: Insufficient documentation

## 2011-06-24 DIAGNOSIS — Z95810 Presence of automatic (implantable) cardiac defibrillator: Secondary | ICD-10-CM

## 2011-06-24 MED ORDER — POLYETHYLENE GLYCOL 3350 17 GM/SCOOP PO POWD: 17.0000 g | Freq: Every day | ORAL | Status: AC

## 2011-06-24 NOTE — Assessment & Plan Note (Signed)
[name] is a 45 y.o. female with intermittent hematochezia in the setting of chronic constipation and straining. She is going to need colonoscopy for further evaluation of her rectal bleeding/Hemoccult-positive stool to rule out colorectal carcinoma. Differentials also include benign anorectal source such as hemorrhoids or fissure.  I have discussed risks & benefits which include, but are not limited to, bleeding, infection, perforation & drug reaction.  The patient agrees with this plan & written consent will be obtained.

## 2011-06-24 NOTE — Assessment & Plan Note (Signed)
Well controlled on Protonix 40 mg daily. No alarm features.

## 2011-06-24 NOTE — Progress Notes (Signed)
Cc to pcp °

## 2011-06-24 NOTE — Assessment & Plan Note (Signed)
Endoscopy to be notified of patient's defibrillator prior to procedure.

## 2011-06-24 NOTE — Patient Instructions (Signed)
Miralax 17 daily is due to constipation Colonoscopy with Dr. Darrick Penna in the near future    Constipation in Adults Constipation is having fewer than 2 bowel movements per week. Usually, the stools are hard. As we grow older, constipation is more common. If you try to fix constipation with laxatives, the problem may get worse. This is because laxatives taken over a long period of time make the colon muscles weaker. A low-fiber diet, not taking in enough fluids, and taking some medicines may make these problems worse. MEDICATIONS THAT MAY CAUSE CONSTIPATION  Water pills (diuretics).   Calcium channel blockers (used to control blood pressure and for the heart).   Certain pain medicines (narcotics).   Anticholinergics.   Anti-inflammatory agents.   Antacids that contain aluminum.  DISEASES THAT CONTRIBUTE TO CONSTIPATION  Diabetes.   Parkinson's disease.   Dementia.   Stroke.   Depression.   Illnesses that cause problems with salt and water metabolism.  HOME CARE INSTRUCTIONS   Constipation is usually best cared for without medicines. Increasing dietary fiber and eating more fruits and vegetables is the best way to manage constipation.   Slowly increase fiber intake to 25 to 38 grams per day. Whole grains, fruits, vegetables, and legumes are good sources of fiber. A dietitian can further help you incorporate high-fiber foods into your diet.   Drink enough water and fluids to keep your urine clear or pale yellow.   A fiber supplement may be added to your diet if you cannot get enough fiber from foods.   Increasing your activities also helps improve regularity.   Suppositories, as suggested by your caregiver, will also help. If you are using antacids, such as aluminum or calcium containing products, it will be helpful to switch to products containing magnesium if your caregiver says it is okay.   If you have been given a liquid injection (enema) today, this is only a temporary  measure. It should not be relied on for treatment of longstanding (chronic) constipation.   Stronger measures, such as magnesium sulfate, should be avoided if possible. This may cause uncontrollable diarrhea. Using magnesium sulfate may not allow you time to make it to the bathroom.  SEEK IMMEDIATE MEDICAL CARE IF:   There is bright red blood in the stool.   The constipation stays for more than 4 days.   There is belly (abdominal) or rectal pain.   You do not seem to be getting better.   You have any questions or concerns.  MAKE SURE YOU:   Understand these instructions.   Will watch your condition.   Will get help right away if you are not doing well or get worse.  Document Released: 05/02/2004 Document Revised: 04/16/2011 Document Reviewed: 03/22/2008 [facility] Patient Information 2012 [place], [place].

## 2011-06-24 NOTE — Progress Notes (Signed)
Referring Provider: Syliva Overman, MD Primary Care Physician:  Syliva Overman, MD, MD Primary Gastroenterologist:  Dr. Darrick Penna  Chief Complaint  Patient presents with  . Hematochezia  . Constipation    HPI:  [name] is a 45 y.o. female here as a referral from Dr. Lodema Hong for Hemoccult-positive stool. Ms. Andaya notes chronic constipation & straining to use bathroom since she started a new medication Lisinopril a couple of weeks ago.  While at Dr. Anthony Sar office she had an internal rectal exam & was found to be hemoccult positive.  Pt has seen small amts hematochezia w/ straining.  Denies abdominal pain, nausea or vomiting.  Daily heartburn & indigestion on protonix 40mg .  Works 100%.  Wt stable.  Appetite ok.  Denies dysphagia or odynophagia. Past Medical History  Diagnosis Date  . coronary artery disease     RCA stenting Myoview 2011 EF 30% infarction dilatation without ischemia  . CHF (congestive heart failure), NYHA class III   . Automatic implantable cardiac defibrillator in situ     Medtronic  . Other and unspecified hyperlipidemia   . Cardiomyopathy secondary     Patient has cardiomyopathy out of proportion to her ischemic heart disease  . Depression   . GERD (gastroesophageal reflux disease)   . Diabetes mellitus   . Nicotine abuse   . Insomnia   . Migraines   . Lupus (systemic lupus erythematosus)   . Arthritis of knee     Past Surgical History  Procedure Date  . Cholecystectomy   . Abdominal hysterectomy   . Rotator cuff repair 2002  . Defibrillator placed  2009    Current Outpatient Prescriptions  Medication Sig Dispense Refill  . aspirin 81 MG tablet Take 81 mg by mouth daily.        Marland Kitchen atorvastatin (LIPITOR) 80 MG tablet Take 1 tablet (80 mg total) by mouth daily.  30 tablet  3  . HYDROcodone-acetaminophen (NORCO) 5-325 MG per tablet 1 or 2 po q4h prn pain  20 tablet  0  . lisinopril (PRINIVIL,ZESTRIL) 2.5 MG tablet Take 1 tablet (2.5 mg total)  by mouth daily.  30 tablet  12  . metFORMIN (GLUCOPHAGE) 500 MG tablet Take 1 tablet (500 mg total) by mouth 2 (two) times daily with a meal.  60 tablet  3  . methocarbamol (ROBAXIN) 500 MG tablet 2 po tid with food for spasm.  20 tablet  0  . metoprolol tartrate (LOPRESSOR) 25 MG tablet Take 1 tablet (25 mg total) by mouth 2 (two) times daily.  60 tablet  12  . oxyCODONE-acetaminophen (TYLOX) 5-500 MG per capsule Take 1 capsule by mouth every 4 (four) hours as needed.       . TESSALON PERLES 100 MG capsule TAKE (1) CAPSULE BY      MOUTHTHREE TIMES A DAY ASNEEDED FOR COUGH  30 each  0  . polyethylene glycol powder (MIRALAX) powder Take 17 g by mouth daily.  527 g  5    Allergies as of 06/24/2011 - Review Complete 06/24/2011  Allergen Reaction Noted  . Bee venom Anaphylaxis 04/09/2011  . Cortisone  05/27/2011    Family History:There is no known family history of colorectal carcinoma , liver disease, or inflammatory bowel disease.  Problem Relation Age of Onset  . Hepatitis Mother 60    HCV  . Liver cancer Mother 49    History   Social History  . Marital Status: Single    Spouse Name: N/A  Number of Children: 3  . Years of Education: N/A   Occupational History  . disabled    Social History Main Topics  . Smoking status: Current Everyday Smoker -- 0.5 packs/day for 7 years    Types: Cigarettes  . Smokeless tobacco: Not on file  . Alcohol Use: No  . Drug Use: No  . Sexually Active: Not on file   Other Topics Concern  . Not on file   Social History Narrative  . No narrative on file  Review of Systems: Gen: Denies any fever, chills, sweats, anorexia, fatigue, weakness, malaise, weight loss, and sleep disorder CV: Denies chest pain, angina, palpitations, syncope, orthopnea, PND, peripheral edema, and claudication. Resp: Denies dyspnea at rest, dyspnea with exercise, cough, sputum, wheezing, coughing up blood, and pleurisy. GI: Denies vomiting blood, jaundice, and fecal  incontinence.   Denies dysphagia or odynophagia. GU : Denies urinary burning, blood in urine, urinary frequency, urinary hesitancy, nocturnal urination, and urinary incontinence. MS: Denies joint pain, limitation of movement, and swelling, stiffness, low back pain, extremity pain. Denies muscle weakness, cramps, atrophy.  Derm: Denies rash, itching, dry skin, hives, moles, warts, or unhealing ulcers.  Psych: Denies depression, anxiety, memory loss, suicidal ideation, hallucinations, paranoia, and confusion. Heme: Denies bruising and enlarged lymph nodes.  Physical Exam: BP 104/72  Pulse 69  Temp(Src) 97.9 F (36.6 C) (Temporal)  Ht 5\' 6"  (1.676 m)  Wt 213 lb 3.2 oz (96.707 kg)  BMI 34.41 kg/m2 General:   Alert,  Well-developed, obese, pleasant and cooperative in NAD Head:  Normocephalic and atraumatic. Eyes:  Sclera clear, no icterus.   Conjunctiva pink. Ears:  Normal auditory acuity. Nose:  No deformity, discharge,  or lesions. Mouth:  No deformity or lesions, oropharynx pink and moist. Neck:  Supple; no masses or thyromegaly. Lungs:  Clear throughout to auscultation.   No wheezes, crackles, or rhonchi. No acute distress. Heart:  Regular rate and rhythm; no murmurs, clicks, rubs,  or gallops. Abdomen:  Obese.  Soft, nontender and nondistended. No masses, hepatosplenomegaly or hernias noted. Normal bowel sounds, without guarding, and without rebound.   Rectal:  Deferred until time of colonoscopy.   Msk:  Symmetrical without gross deformities. Normal posture. Pulses:  Normal pulses noted. Extremities:  Without clubbing or edema. Neurologic:  Alert and  oriented x4;  grossly normal neurologically. Skin:  Intact without significant lesions or rashes. Cervical Nodes:  No significant cervical adenopathy. Psych:  Alert and cooperative. Normal mood and affect.

## 2011-06-24 NOTE — Assessment & Plan Note (Signed)
Chronic constipation. Worsened with the addition of lisinopril. Constipation literature. Add MiraLax 17 g daily as needed for constipation.

## 2011-06-29 ENCOUNTER — Other Ambulatory Visit: Payer: Self-pay | Admitting: Family Medicine

## 2011-06-29 DIAGNOSIS — R87629 Unspecified abnormal cytological findings in specimens from vagina: Secondary | ICD-10-CM

## 2011-06-30 NOTE — Progress Notes (Signed)
Pt was referred to dr. Robinette Haines office. They will call pt with appt and time. Pt aware

## 2011-07-03 ENCOUNTER — Encounter (HOSPITAL_COMMUNITY): Payer: Self-pay | Admitting: Pharmacy Technician

## 2011-07-08 NOTE — Progress Notes (Signed)
REVIEWED. AGREE. 

## 2011-07-14 ENCOUNTER — Encounter (HOSPITAL_COMMUNITY): Payer: Self-pay | Admitting: Pharmacy Technician

## 2011-07-14 MED ORDER — SODIUM CHLORIDE 0.45 % IV SOLN
Freq: Once | INTRAVENOUS | Status: AC
  Administered 2011-07-15: 11:00:00 via INTRAVENOUS

## 2011-07-15 ENCOUNTER — Ambulatory Visit (HOSPITAL_COMMUNITY)
Admission: RE | Admit: 2011-07-15 | Discharge: 2011-07-15 | Disposition: A | Discharge status: Home / Self Care | Payer: Medicare Other | Source: Ambulatory Visit | Attending: Gastroenterology | Admitting: Gastroenterology

## 2011-07-15 ENCOUNTER — Encounter (HOSPITAL_COMMUNITY): Payer: Self-pay | Admitting: *Deleted

## 2011-07-15 ENCOUNTER — Encounter (HOSPITAL_COMMUNITY)
Admission: RE | Disposition: A | Discharge status: Home / Self Care | Payer: Self-pay | Source: Ambulatory Visit | Attending: Gastroenterology

## 2011-07-15 ENCOUNTER — Other Ambulatory Visit: Payer: Self-pay | Admitting: Gastroenterology

## 2011-07-15 DIAGNOSIS — K5730 Diverticulosis of large intestine without perforation or abscess without bleeding: Secondary | ICD-10-CM

## 2011-07-15 DIAGNOSIS — E119 Type 2 diabetes mellitus without complications: Secondary | ICD-10-CM | POA: Insufficient documentation

## 2011-07-15 DIAGNOSIS — K625 Hemorrhage of anus and rectum: Secondary | ICD-10-CM

## 2011-07-15 DIAGNOSIS — D126 Benign neoplasm of colon, unspecified: Secondary | ICD-10-CM

## 2011-07-15 DIAGNOSIS — K921 Melena: Secondary | ICD-10-CM | POA: Insufficient documentation

## 2011-07-15 DIAGNOSIS — K620 Anal polyp: Secondary | ICD-10-CM

## 2011-07-15 DIAGNOSIS — D128 Benign neoplasm of rectum: Secondary | ICD-10-CM | POA: Insufficient documentation

## 2011-07-15 DIAGNOSIS — K621 Rectal polyp: Secondary | ICD-10-CM

## 2011-07-15 DIAGNOSIS — K648 Other hemorrhoids: Secondary | ICD-10-CM | POA: Insufficient documentation

## 2011-07-15 DIAGNOSIS — Z7982 Long term (current) use of aspirin: Secondary | ICD-10-CM | POA: Insufficient documentation

## 2011-07-15 HISTORY — PX: COLONOSCOPY: SHX5424

## 2011-07-15 HISTORY — DX: Acute myocardial infarction, unspecified: I21.9

## 2011-07-15 SURGERY — COLONOSCOPY
Anesthesia: Moderate Sedation

## 2011-07-15 MED ORDER — PROMETHAZINE HCL 25 MG/ML IJ SOLN
INTRAMUSCULAR | Status: AC
  Filled 2011-07-15: qty 1

## 2011-07-15 MED ORDER — MIDAZOLAM HCL 5 MG/5ML IJ SOLN
INTRAMUSCULAR | Status: DC | PRN
  Administered 2011-07-15: 1 mg via INTRAVENOUS
  Administered 2011-07-15: 2 mg via INTRAVENOUS
  Administered 2011-07-15: 1 mg via INTRAVENOUS

## 2011-07-15 MED ORDER — PROMETHAZINE HCL 25 MG/ML IJ SOLN
INTRAMUSCULAR | Status: DC | PRN
  Administered 2011-07-15: 12.5 mg via INTRAVENOUS

## 2011-07-15 MED ORDER — MIDAZOLAM HCL 5 MG/5ML IJ SOLN
INTRAMUSCULAR | Status: AC
  Filled 2011-07-15: qty 10

## 2011-07-15 MED ORDER — MEPERIDINE HCL 100 MG/ML IJ SOLN
INTRAMUSCULAR | Status: DC | PRN
  Administered 2011-07-15: 50 mg via INTRAVENOUS
  Administered 2011-07-15: 25 mg via INTRAVENOUS

## 2011-07-15 MED ORDER — MEPERIDINE HCL 100 MG/ML IJ SOLN
INTRAMUSCULAR | Status: AC
  Filled 2011-07-15: qty 2

## 2011-07-15 NOTE — Interval H&P Note (Signed)
History and Physical Interval Note:   07/15/2011   10:14 AM   [name]  has presented today for surgery, with the diagnosis of HEMATOCHEZIA & CONSTIPATION  The various methods of treatment have been discussed with the patient and family. After consideration of risks, benefits and other options for treatment, the patient has consented to  Procedure(s): COLONOSCOPY as a surgical intervention .  The patients' history has been reviewed, patient examined, no change in status, stable for surgery.  I have reviewed the patients' chart and labs.  Questions were answered to the patient's satisfaction.     Jonette Eva  MD

## 2011-07-15 NOTE — H&P (Signed)
BP Pulse Temp(Src) Ht Wt BMI    104/72  69  97.9 F (36.6 C) (Temporal)  5\' 6"  (1.676 m)  213 lb 3.2 oz (96.707 kg)  34.41 kg/m2       Progress Notes     Rachel Burton, NP  06/24/2011  3:48 PM  Signed Referring Provider: Syliva Overman, MD Primary Care Physician:  Syliva Overman, MD, MD Primary Gastroenterologist:  Dr. Darrick Penna    Chief Complaint   Patient presents with   .  Hematochezia   .  Constipation      HPI:  [name] is a 45 y.o. female here as a referral from Dr. Lodema Hong for Hemoccult-positive stool. [name] notes chronic constipation & straining to use bathroom since she started a new medication Lisinopril a couple of weeks ago.  While at Dr. Anthony Sar office she had an internal rectal exam & was found to be hemoccult positive.  Pt has seen small amts hematochezia w/ straining.  Denies abdominal pain, nausea or vomiting.  Daily heartburn & indigestion on protonix 40mg .  Works 100%.  Wt stable.  Appetite ok.  Denies dysphagia or odynophagia. Past Medical History   Diagnosis  Date   .  coronary artery disease         RCA stenting Myoview 2011 EF 30% infarction dilatation without ischemia   .  CHF (congestive heart failure), NYHA class III     .  Automatic implantable cardiac defibrillator in situ         Medtronic   .  Other and unspecified hyperlipidemia     .  Cardiomyopathy secondary         Patient has cardiomyopathy out of proportion to her ischemic heart disease   .  Depression     .  GERD (gastroesophageal reflux disease)     .  Diabetes mellitus     .  Nicotine abuse     .  Insomnia     .  Migraines     .  Lupus (systemic lupus erythematosus)     .  Arthritis of knee         Past Surgical History   Procedure  Date   .  Cholecystectomy     .  Abdominal hysterectomy     .  Rotator cuff repair  2002   .  Defibrillator placed   2009       Current Outpatient Prescriptions   Medication  Sig  Dispense  Refill   .  aspirin 81 MG  tablet  Take 81 mg by mouth daily.           Marland Kitchen  atorvastatin (LIPITOR) 80 MG tablet  Take 1 tablet (80 mg total) by mouth daily.   30 tablet   3   .  HYDROcodone-acetaminophen (NORCO) 5-325 MG per tablet  1 or 2 po q4h prn pain   20 tablet   0   .  lisinopril (PRINIVIL,ZESTRIL) 2.5 MG tablet  Take 1 tablet (2.5 mg total) by mouth daily.   30 tablet   12   .  metFORMIN (GLUCOPHAGE) 500 MG tablet  Take 1 tablet (500 mg total) by mouth 2 (two) times daily with a meal.   60 tablet   3   .  methocarbamol (ROBAXIN) 500 MG tablet  2 po tid with food for spasm.   20 tablet   0   .  metoprolol tartrate (LOPRESSOR) 25 MG  tablet  Take 1 tablet (25 mg total) by mouth 2 (two) times daily.   60 tablet   12   .  oxyCODONE-acetaminophen (TYLOX) 5-500 MG per capsule  Take 1 capsule by mouth every 4 (four) hours as needed.          .  TESSALON PERLES 100 MG capsule  TAKE (1) CAPSULE BY      MOUTHTHREE TIMES A DAY ASNEEDED FOR COUGH   30 each   0   .  polyethylene glycol powder (MIRALAX) powder  Take 17 g by mouth daily.   527 g   5       Allergies as of 06/24/2011 - Review Complete 06/24/2011   Allergen  Reaction  Noted   .  Bee venom  Anaphylaxis  04/09/2011   .  Cortisone    05/27/2011       Family History:There is no known family history of colorectal carcinoma , liver disease, or inflammatory bowel disease.   Problem  Relation  Age of Onset   .  Hepatitis  Mother  60       HCV   .  Liver cancer  Mother  52         History       Social History   .  Marital Status:  Single       Spouse Name:  N/A       Number of Children:  3   .  Years of Education:  N/A       Occupational History   .  disabled         Social History Main Topics   .  Smoking status:  Current Everyday Smoker -- 0.5 packs/day for 7 years       Types:  Cigarettes   .  Smokeless tobacco:  Not on file   .  Alcohol Use:  No   .  Drug Use:  No   .  Sexually Active:  Not on file       Other Topics  Concern   .  Not on file        Social History Narrative   .  No narrative on file    Review of Systems: Gen: Denies any fever, chills, sweats, anorexia, fatigue, weakness, malaise, weight loss, and sleep disorder CV: Denies chest pain, angina, palpitations, syncope, orthopnea, PND, peripheral edema, and claudication. Resp: Denies dyspnea at rest, dyspnea with exercise, cough, sputum, wheezing, coughing up blood, and pleurisy. GI: Denies vomiting blood, jaundice, and fecal incontinence.   Denies dysphagia or odynophagia. GU : Denies urinary burning, blood in urine, urinary frequency, urinary hesitancy, nocturnal urination, and urinary incontinence. MS: Denies joint pain, limitation of movement, and swelling, stiffness, low back pain, extremity pain. Denies muscle weakness, cramps, atrophy.   Derm: Denies rash, itching, dry skin, hives, moles, warts, or unhealing ulcers.   Psych: Denies depression, anxiety, memory loss, suicidal ideation, hallucinations, paranoia, and confusion. Heme: Denies bruising and enlarged lymph nodes.   Physical Exam: BP 104/72  Pulse 69  Temp(Src) 97.9 F (36.6 C) (Temporal)  Ht 5\' 6"  (1.676 m)  Wt 213 lb 3.2 oz (96.707 kg)  BMI 34.41 kg/m2 General:   Alert,  Well-developed, obese, pleasant and cooperative in NAD Head:  Normocephalic and atraumatic. Eyes:  Sclera clear, no icterus.   Conjunctiva pink. Ears:  Normal auditory acuity. Nose:  No deformity, discharge,  or lesions. Mouth:  No deformity or lesions, oropharynx pink and moist.  Neck:  Supple; no masses or thyromegaly. Lungs:  Clear throughout to auscultation.   No wheezes, crackles, or rhonchi. No acute distress. Heart:  Regular rate and rhythm; no murmurs, clicks, rubs,  or gallops. Abdomen:  Obese.  Soft, nontender and nondistended. No masses, hepatosplenomegaly or hernias noted. Normal bowel sounds, without guarding, and without rebound.    Rectal:  Deferred until time of colonoscopy.    Msk:  Symmetrical without gross  deformities. Normal posture. Pulses:  Normal pulses noted. Extremities:  Without clubbing or edema. Neurologic:  Alert and  oriented x4;  grossly normal neurologically. Skin:  Intact without significant lesions or rashes. Cervical Nodes:  No significant cervical adenopathy. Psych:  Alert and cooperative. Normal mood and affect.     Glendora Score  06/24/2011  2:34 PM  Signed Cc to pcp  Jonette Eva, MD  07/08/2011  5:37 PM  Signed REVIEWED. AGREE.        Hematochezia - Rachel Burton, NP  06/24/2011  2:06 PM  Signed [name] is a 45 y.o. female with intermittent hematochezia in the setting of chronic constipation and straining. She is going to need colonoscopy for further evaluation of her rectal bleeding/Hemoccult-positive stool to rule out colorectal carcinoma. Differentials also include benign anorectal source such as hemorrhoids or fissure.  I have discussed risks & benefits which include, but are not limited to, bleeding, infection, perforation & drug reaction.  The patient agrees with this plan & written consent will be obtained.         GERD - Rachel Burton, NP  06/24/2011  2:06 PM  Signed Well controlled on Protonix 40 mg daily. No alarm features.  Automatic implantable cardiac defibrillator--[employer]tronic Rachel Burton, NP  06/24/2011  2:07 PM  Signed Endoscopy to be notified of patient's defibrillator prior to procedure.  Constipation - Rachel Burton, NP  06/24/2011  2:08 PM  Signed Chronic constipation. Worsened with the addition of lisinopril. Constipation literature. Add MiraLax 17 g daily as needed for constipation.

## 2011-07-18 ENCOUNTER — Encounter: Payer: Self-pay | Admitting: Obstetrics & Gynecology

## 2011-07-18 ENCOUNTER — Telehealth: Payer: Self-pay | Admitting: Gastroenterology

## 2011-07-18 NOTE — Telephone Encounter (Signed)
PLEASE CALL PT. SHE HAD A SIMPLE ADENOMA REMOVED. TCS IN 5 YEARS. HIGH FIBER DIET.

## 2011-07-21 NOTE — Telephone Encounter (Signed)
Pt informed

## 2011-07-21 NOTE — Telephone Encounter (Signed)
Reminder in epic to have tcs in 5 years °

## 2011-07-21 NOTE — Telephone Encounter (Signed)
Results Cc to PCP  

## 2011-07-24 ENCOUNTER — Encounter (HOSPITAL_COMMUNITY): Payer: Self-pay | Admitting: Gastroenterology

## 2011-08-28 ENCOUNTER — Encounter: Payer: Medicare Other | Admitting: *Deleted

## 2011-09-01 ENCOUNTER — Encounter: Payer: Self-pay | Admitting: *Deleted

## 2011-09-03 ENCOUNTER — Telehealth: Payer: Self-pay | Admitting: Family Medicine

## 2011-09-03 NOTE — Telephone Encounter (Signed)
Pt aware.

## 2011-09-03 NOTE — Telephone Encounter (Signed)
She will need to have this agreed upon by her current prescriber,after I hear from them I will do this but need to hear from the current prescriber

## 2011-09-11 ENCOUNTER — Ambulatory Visit (INDEPENDENT_AMBULATORY_CARE_PROVIDER_SITE_OTHER): Payer: Medicare Other | Admitting: Family Medicine

## 2011-09-11 ENCOUNTER — Encounter: Payer: Self-pay | Admitting: Family Medicine

## 2011-09-11 VITALS — BP 112/70 | HR 108 | Resp 18 | Ht 66.0 in | Wt 213.1 lb

## 2011-09-11 DIAGNOSIS — R51 Headache: Secondary | ICD-10-CM

## 2011-09-11 DIAGNOSIS — E559 Vitamin D deficiency, unspecified: Secondary | ICD-10-CM

## 2011-09-11 DIAGNOSIS — R519 Headache, unspecified: Secondary | ICD-10-CM | POA: Insufficient documentation

## 2011-09-11 DIAGNOSIS — E119 Type 2 diabetes mellitus without complications: Secondary | ICD-10-CM

## 2011-09-11 DIAGNOSIS — I10 Essential (primary) hypertension: Secondary | ICD-10-CM

## 2011-09-11 DIAGNOSIS — E538 Deficiency of other specified B group vitamins: Secondary | ICD-10-CM

## 2011-09-11 DIAGNOSIS — F17200 Nicotine dependence, unspecified, uncomplicated: Secondary | ICD-10-CM

## 2011-09-11 DIAGNOSIS — J019 Acute sinusitis, unspecified: Secondary | ICD-10-CM | POA: Insufficient documentation

## 2011-09-11 DIAGNOSIS — J309 Allergic rhinitis, unspecified: Secondary | ICD-10-CM

## 2011-09-11 DIAGNOSIS — R5381 Other malaise: Secondary | ICD-10-CM

## 2011-09-11 DIAGNOSIS — E785 Hyperlipidemia, unspecified: Secondary | ICD-10-CM

## 2011-09-11 DIAGNOSIS — J329 Chronic sinusitis, unspecified: Secondary | ICD-10-CM

## 2011-09-11 MED ORDER — FLUTICASONE PROPIONATE 50 MCG/ACT NA SUSP: 2.0000 | Freq: Every day | NASAL | Status: DC

## 2011-09-11 MED ORDER — CETIRIZINE HCL 10 MG PO CHEW: 10.0000 mg | CHEWABLE_TABLET | Freq: Every day | ORAL | Status: DC

## 2011-09-11 MED ORDER — FLUCONAZOLE 150 MG PO TABS: ORAL_TABLET | ORAL | Status: DC

## 2011-09-11 MED ORDER — AZITHROMYCIN 250 MG PO TABS: ORAL_TABLET | ORAL | Status: AC

## 2011-09-11 MED ORDER — PREDNISONE (PAK) 5 MG PO TABS: 5.0000 mg | ORAL_TABLET | ORAL | Status: DC

## 2011-09-11 MED ORDER — KETOROLAC TROMETHAMINE 60 MG/2ML IJ SOLN
60.0000 mg | Freq: Once | INTRAMUSCULAR | Status: AC
  Administered 2011-09-11: 60 mg via INTRAMUSCULAR

## 2011-09-11 NOTE — Patient Instructions (Signed)
F/U in early March  You are being treated for headache, sinusitis and uncontrolled allergies.   Medication is sent to [facility].  Fasting labs end February  You need to cut down on cigarettes start at 7 in feb, then 6 in March, you need to quit!

## 2011-09-12 ENCOUNTER — Other Ambulatory Visit: Payer: Self-pay

## 2011-09-12 DIAGNOSIS — J329 Chronic sinusitis, unspecified: Secondary | ICD-10-CM

## 2011-09-12 DIAGNOSIS — E119 Type 2 diabetes mellitus without complications: Secondary | ICD-10-CM

## 2011-09-12 DIAGNOSIS — R5381 Other malaise: Secondary | ICD-10-CM

## 2011-09-12 DIAGNOSIS — R51 Headache: Secondary | ICD-10-CM

## 2011-09-12 DIAGNOSIS — E785 Hyperlipidemia, unspecified: Secondary | ICD-10-CM

## 2011-09-12 DIAGNOSIS — I10 Essential (primary) hypertension: Secondary | ICD-10-CM

## 2011-09-12 DIAGNOSIS — J309 Allergic rhinitis, unspecified: Secondary | ICD-10-CM

## 2011-09-12 DIAGNOSIS — E538 Deficiency of other specified B group vitamins: Secondary | ICD-10-CM

## 2011-09-12 MED ORDER — FLUCONAZOLE 150 MG PO TABS: ORAL_TABLET | ORAL | Status: DC

## 2011-09-13 ENCOUNTER — Encounter: Payer: Self-pay | Admitting: Family Medicine

## 2011-09-13 DIAGNOSIS — I10 Essential (primary) hypertension: Secondary | ICD-10-CM | POA: Insufficient documentation

## 2011-09-13 NOTE — Assessment & Plan Note (Signed)
Controlled, no change in medication  

## 2011-09-13 NOTE — Assessment & Plan Note (Signed)
Antibiotic prescribed 

## 2011-09-13 NOTE — Progress Notes (Signed)
  Subjective:    Patient ID: [name], female    DOB: Aug 09, 1966, 46 y.o.   MRN: [id number]  HPI 1 week h/o increased facial pressure, nasal congestion, headache, feer and chills.Drainage from nose was clear, but has become increasingly thick and yellow, pt has experienced increased fatigue and debility. No productive cough. Nicotine use at 10 per day, unwilling to set quit date Denies polyuria, polydipsia or blurred vision   Review of Systems See HPI . Denies chest congestion, productive cough or wheezing. Denies chest pains, palpitations and leg swelling Denies abdominal pain, nausea, vomiting,diarrhea or constipation.   Denies dysuria, frequency, hesitancy or incontinence. Denies joint pain, swelling and limitation in mobility. Denies  seizures, numbness, or tingling. Denies depression, anxiety or insomnia. Denies skin break down or rash.        Objective:   Physical Exam  Patient alert and oriented and in no cardiopulmonary distress.Ill appearing  HEENT: No facial asymmetry, EOMI,frontal and maxillary   sinus tenderness,  oropharynx pink and moist.  Neck supple anterior cervical adenopathy.  Chest: Clear to auscultation bilaterally.  CVS: S1, S2 no murmurs, no S3.  ABD: Soft non tender. Bowel sounds normal.  Ext: No edema  MS: Adequate ROM spine, shoulders, hips and knees.  Skin: Intact, no ulcerations or rash noted.  Psych: Good eye contact, normal affect. Memory intact not anxious or depressed appearing.  CNS: CN 2-12 intact, power, tone and sensation normal throughout.       Assessment & Plan:

## 2011-09-13 NOTE — Assessment & Plan Note (Signed)
Uncontrolled , steroid taper

## 2011-09-13 NOTE — Assessment & Plan Note (Signed)
Acute flare with excessive sinus congestion, toradol in office

## 2011-09-13 NOTE — Assessment & Plan Note (Signed)
Controlled , low carb diet encocarb awareness and the need to follow controlled carb diet stressed

## 2011-09-13 NOTE — Assessment & Plan Note (Signed)
Current 10 per day counseled to quit

## 2011-09-15 ENCOUNTER — Other Ambulatory Visit: Payer: Self-pay

## 2011-09-15 DIAGNOSIS — R51 Headache: Secondary | ICD-10-CM

## 2011-09-15 DIAGNOSIS — E119 Type 2 diabetes mellitus without complications: Secondary | ICD-10-CM

## 2011-09-15 DIAGNOSIS — R5383 Other fatigue: Secondary | ICD-10-CM

## 2011-09-15 DIAGNOSIS — E785 Hyperlipidemia, unspecified: Secondary | ICD-10-CM

## 2011-09-15 DIAGNOSIS — R5381 Other malaise: Secondary | ICD-10-CM

## 2011-09-15 DIAGNOSIS — E538 Deficiency of other specified B group vitamins: Secondary | ICD-10-CM

## 2011-09-15 DIAGNOSIS — J329 Chronic sinusitis, unspecified: Secondary | ICD-10-CM

## 2011-09-15 DIAGNOSIS — J309 Allergic rhinitis, unspecified: Secondary | ICD-10-CM

## 2011-09-15 DIAGNOSIS — I10 Essential (primary) hypertension: Secondary | ICD-10-CM

## 2011-09-15 MED ORDER — CETIRIZINE HCL 10 MG PO TABS: 10.0000 mg | ORAL_TABLET | Freq: Every day | ORAL | Status: DC

## 2011-10-13 ENCOUNTER — Encounter: Payer: Self-pay | Admitting: Internal Medicine

## 2011-10-13 ENCOUNTER — Ambulatory Visit (INDEPENDENT_AMBULATORY_CARE_PROVIDER_SITE_OTHER): Payer: Medicare Other | Admitting: *Deleted

## 2011-10-13 DIAGNOSIS — I428 Other cardiomyopathies: Secondary | ICD-10-CM

## 2011-10-13 DIAGNOSIS — I4901 Ventricular fibrillation: Secondary | ICD-10-CM

## 2011-10-17 LAB — REMOTE ICD DEVICE
BATTERY VOLTAGE: 2.9 V
CHARGE TIME: 10.03 s
RV LEAD IMPEDENCE ICD: 456 Ohm
TZAT-0001SLOWVT: 1
TZAT-0001SLOWVT: 2
TZAT-0002FASTVT: NEGATIVE
TZAT-0005FASTVT: 88 pct
TZAT-0011FASTVT: 10 ms
TZAT-0011SLOWVT: 10 ms
TZAT-0012FASTVT: 200 ms
TZAT-0018FASTVT: NEGATIVE
TZAT-0018SLOWVT: NEGATIVE
TZAT-0019FASTVT: 8 V
TZAT-0019SLOWVT: 8 V
TZAT-0019SLOWVT: 8 V
TZAT-0020SLOWVT: 1.6 ms
TZAT-0020SLOWVT: 1.6 ms
TZON-0003FASTVT: 260 ms
TZON-0008FASTVT: 0 ms
TZON-0008SLOWVT: 0 ms
TZON-0011AFLUTTER: 70
TZST-0001FASTVT: 3
TZST-0001FASTVT: 5
TZST-0001FASTVT: 6
TZST-0001SLOWVT: 3
TZST-0001SLOWVT: 5
TZST-0002FASTVT: NEGATIVE
TZST-0002FASTVT: NEGATIVE
TZST-0002FASTVT: NEGATIVE
TZST-0002FASTVT: NEGATIVE
TZST-0003FASTVT: 35 J
TZST-0003FASTVT: 35 J
TZST-0003FASTVT: 35 J
TZST-0003SLOWVT: 35 J
TZST-0003SLOWVT: 35 J
TZST-0003SLOWVT: 35 J
VENTRICULAR PACING ICD: 0 pct

## 2011-10-23 ENCOUNTER — Ambulatory Visit: Payer: Medicare Other | Admitting: Family Medicine

## 2011-10-28 ENCOUNTER — Encounter: Payer: Self-pay | Admitting: *Deleted

## 2011-10-29 ENCOUNTER — Ambulatory Visit: Payer: Medicare Other | Admitting: Family Medicine

## 2011-10-29 LAB — LIPID PANEL
Cholesterol: 155 mg/dL (ref 0–200)
Triglycerides: 100 mg/dL (ref <150)

## 2011-10-29 LAB — CBC WITH DIFFERENTIAL/PLATELET
Eosinophils Absolute: 0.2 10*3/uL (ref 0.0–0.7)
Eosinophils Relative: 3 % (ref 0–5)
Hemoglobin: 13.3 g/dL (ref 12.0–15.0)
Lymphs Abs: 3 10*3/uL (ref 0.7–4.0)
MCH: 27.5 pg (ref 26.0–34.0)
MCV: 84.9 fL (ref 78.0–100.0)
Monocytes Absolute: 0.4 10*3/uL (ref 0.1–1.0)
Monocytes Relative: 6 % (ref 3–12)
RBC: 4.83 MIL/uL (ref 3.87–5.11)

## 2011-10-29 LAB — HEMOGLOBIN A1C: Mean Plasma Glucose: 131 mg/dL — ABNORMAL HIGH (ref <117)

## 2011-10-29 LAB — COMPLETE METABOLIC PANEL WITH GFR
ALT: 22 U/L (ref 0–35)
Alkaline Phosphatase: 80 U/L (ref 39–117)
CO2: 20 mEq/L (ref 19–32)
Creat: 0.71 mg/dL (ref 0.50–1.10)
GFR, Est African American: >89 mL/min (ref >90)
GFR, Est Non African American: >89 mL/min (ref >90)
Total Bilirubin: 0.6 mg/dL (ref 0.3–1.2)

## 2011-11-07 NOTE — Progress Notes (Signed)
Remote icd check  

## 2011-11-28 NOTE — Progress Notes (Signed)
Addended by: Abner Greenspan on: 11/28/2011 01:55 PM   Modules accepted: Orders

## 2012-01-06 ENCOUNTER — Ambulatory Visit: Payer: Medicare Other | Admitting: Family Medicine

## 2012-01-15 ENCOUNTER — Encounter: Payer: Medicare Other | Admitting: *Deleted

## 2012-01-20 ENCOUNTER — Encounter: Payer: Self-pay | Admitting: *Deleted

## 2012-01-30 ENCOUNTER — Encounter (HOSPITAL_COMMUNITY): Payer: Self-pay | Admitting: *Deleted

## 2012-01-30 ENCOUNTER — Ambulatory Visit (INDEPENDENT_AMBULATORY_CARE_PROVIDER_SITE_OTHER): Payer: Medicare Other | Admitting: Family Medicine

## 2012-01-30 ENCOUNTER — Encounter: Payer: Self-pay | Admitting: Family Medicine

## 2012-01-30 ENCOUNTER — Emergency Department (HOSPITAL_COMMUNITY): Payer: Medicare Other

## 2012-01-30 ENCOUNTER — Emergency Department (HOSPITAL_COMMUNITY)
Admission: EM | Admit: 2012-01-30 | Discharge: 2012-01-30 | Disposition: A | Discharge status: Home / Self Care | Payer: Medicare Other | Attending: Emergency Medicine | Admitting: Emergency Medicine

## 2012-01-30 VITALS — BP 120/70 | HR 93 | Resp 16 | Ht 66.0 in | Wt 216.4 lb

## 2012-01-30 DIAGNOSIS — I252 Old myocardial infarction: Secondary | ICD-10-CM | POA: Insufficient documentation

## 2012-01-30 DIAGNOSIS — R1031 Right lower quadrant pain: Secondary | ICD-10-CM | POA: Insufficient documentation

## 2012-01-30 DIAGNOSIS — A599 Trichomoniasis, unspecified: Secondary | ICD-10-CM | POA: Insufficient documentation

## 2012-01-30 DIAGNOSIS — E119 Type 2 diabetes mellitus without complications: Secondary | ICD-10-CM | POA: Insufficient documentation

## 2012-01-30 DIAGNOSIS — M329 Systemic lupus erythematosus, unspecified: Secondary | ICD-10-CM | POA: Insufficient documentation

## 2012-01-30 DIAGNOSIS — R109 Unspecified abdominal pain: Secondary | ICD-10-CM

## 2012-01-30 DIAGNOSIS — Z90710 Acquired absence of both cervix and uterus: Secondary | ICD-10-CM | POA: Insufficient documentation

## 2012-01-30 DIAGNOSIS — K7689 Other specified diseases of liver: Secondary | ICD-10-CM | POA: Insufficient documentation

## 2012-01-30 DIAGNOSIS — R10813 Right lower quadrant abdominal tenderness: Secondary | ICD-10-CM | POA: Insufficient documentation

## 2012-01-30 DIAGNOSIS — I2510 Atherosclerotic heart disease of native coronary artery without angina pectoris: Secondary | ICD-10-CM | POA: Insufficient documentation

## 2012-01-30 DIAGNOSIS — Z9089 Acquired absence of other organs: Secondary | ICD-10-CM | POA: Insufficient documentation

## 2012-01-30 HISTORY — DX: Atherosclerotic heart disease of native coronary artery without angina pectoris: I25.10

## 2012-01-30 LAB — DIFFERENTIAL
Basophils Absolute: 0.1 K/uL (ref 0.0–0.1)
Basophils Relative: 1 % (ref 0–1)
Eosinophils Absolute: 0.2 K/uL (ref 0.0–0.7)
Eosinophils Relative: 3 % (ref 0–5)
Lymphocytes Relative: 45 % (ref 12–46)
Lymphs Abs: 2.9 K/uL (ref 0.7–4.0)
Monocytes Absolute: 0.4 K/uL (ref 0.1–1.0)
Monocytes Relative: 6 % (ref 3–12)
Neutro Abs: 2.9 K/uL (ref 1.7–7.7)
Neutrophils Relative %: 46 % (ref 43–77)

## 2012-01-30 LAB — COMPREHENSIVE METABOLIC PANEL
BUN: 7 mg/dL (ref 6–23)
Calcium: 9.1 mg/dL (ref 8.4–10.5)
Creatinine, Ser: 0.64 mg/dL (ref 0.50–1.10)
GFR calc Af Amer: >90 mL/min (ref >90)
Glucose, Bld: 98 mg/dL (ref 70–99)
Sodium: 137 mEq/L (ref 135–145)
Total Protein: 8.1 g/dL (ref 6.0–8.3)

## 2012-01-30 LAB — CBC
Hemoglobin: 13.2 g/dL (ref 12.0–15.0)
MCH: 27.4 pg (ref 26.0–34.0)
MCHC: 33 g/dL (ref 30.0–36.0)

## 2012-01-30 LAB — URINALYSIS, ROUTINE W REFLEX MICROSCOPIC
Bilirubin Urine: NEGATIVE
Glucose, UA: NEGATIVE mg/dL
Ketones, ur: NEGATIVE mg/dL
Nitrite: NEGATIVE
Protein, ur: NEGATIVE mg/dL
Specific Gravity, Urine: <1.005 — ABNORMAL LOW (ref 1.005–1.030)
Urobilinogen, UA: 0.2 mg/dL (ref 0.0–1.0)
pH: 6 (ref 5.0–8.0)

## 2012-01-30 LAB — URINE MICROSCOPIC-ADD ON

## 2012-01-30 LAB — LIPASE, BLOOD: Lipase: 99 U/L — ABNORMAL HIGH (ref 11–59)

## 2012-01-30 MED ORDER — ONDANSETRON HCL 4 MG PO TABS
4.0000 mg | ORAL_TABLET | Freq: Once | ORAL | Status: AC
  Administered 2012-01-30: 4 mg via ORAL
  Filled 2012-01-30: qty 1

## 2012-01-30 MED ORDER — CEFTRIAXONE SODIUM 250 MG IJ SOLR
250.0000 mg | Freq: Once | INTRAMUSCULAR | Status: AC
  Administered 2012-01-30: 250 mg via INTRAMUSCULAR
  Filled 2012-01-30: qty 250

## 2012-01-30 MED ORDER — IOHEXOL 300 MG/ML  SOLN
100.0000 mL | Freq: Once | INTRAMUSCULAR | Status: AC | PRN
  Administered 2012-01-30: 100 mL via INTRAVENOUS

## 2012-01-30 MED ORDER — ONDANSETRON HCL 4 MG/2ML IJ SOLN
4.0000 mg | Freq: Once | INTRAMUSCULAR | Status: AC
  Administered 2012-01-30: 4 mg via INTRAVENOUS
  Filled 2012-01-30: qty 2

## 2012-01-30 MED ORDER — AZITHROMYCIN 250 MG PO TABS
1000.0000 mg | ORAL_TABLET | Freq: Once | ORAL | Status: AC
  Administered 2012-01-30: 1000 mg via ORAL
  Filled 2012-01-30: qty 4

## 2012-01-30 MED ORDER — HYDROMORPHONE HCL PF 1 MG/ML IJ SOLN
1.0000 mg | Freq: Once | INTRAMUSCULAR | Status: AC
  Administered 2012-01-30: 1 mg via INTRAVENOUS
  Filled 2012-01-30: qty 1

## 2012-01-30 MED ORDER — LIDOCAINE HCL (PF) 1 % IJ SOLN
INTRAMUSCULAR | Status: AC
  Administered 2012-01-30: 1.2 mL
  Filled 2012-01-30: qty 5

## 2012-01-30 MED ORDER — METRONIDAZOLE 500 MG PO TABS
2000.0000 mg | ORAL_TABLET | Freq: Once | ORAL | Status: AC
  Administered 2012-01-30: 2000 mg via ORAL
  Filled 2012-01-30: qty 4

## 2012-01-30 MED ORDER — FLUCONAZOLE 100 MG PO TABS
150.0000 mg | ORAL_TABLET | Freq: Once | ORAL | Status: AC
  Administered 2012-01-30: 150 mg via ORAL
  Filled 2012-01-30: qty 2

## 2012-01-30 NOTE — Patient Instructions (Signed)
Go to ER to be evaluated for appendicitis

## 2012-01-30 NOTE — ED Notes (Signed)
Low abd pain since last pm.No NVD. No fever.

## 2012-01-30 NOTE — ED Notes (Signed)
Pelvic exam be edp. Cultures to lab

## 2012-01-30 NOTE — Progress Notes (Signed)
  Subjective:    Patient ID: [name], female    DOB: 04/15/1966, 46 y.o.   MRN: [id number]  HPI   Last  Night pt began to have pain in right lower quadrant. She says pain is severe in nature and kept her up last night. It was worse this morning. It is not radiating but pain in the right lower quadrant and she can feel some over her bladder. She denies any dysuria, vaginal discharge or urinary frequency. She denies chest pain, nausea, vomiting, fever. She has a history of hysterectomy and cholecystectomy. She does have some constipation but uses mild laxatives when needed. Last bowel movement was 2 days ago.   Review of Systems - per above    GEN- denies fatigue, fever, weight loss,weakness, recent illness HEENT- denies eye drainage, change in vision, nasal discharge, CVS- denies chest pain, palpitations RESP- denies SOB, cough, wheeze ABD- denies N/V, change in stools, abd pain GU- denies dysuria, hematuria, dribbling, incontinence MSK- denies joint pain, muscle aches, injury Neuro- denies headache, dizziness, syncope, seizure activity      Objective:   Physical Exam GEN- NAD, alert and oriented x3 HEENT- L, EOMI, non injected sclera, pink conjunctiva, MMM, oropharynx clear CVS- RRR, no murmur RESP-CTAB ABD-NABS,soft, TTP RLQ, +gaurding, +suprapubic tenderness, equivocal rosvings, no masses felt  EXT- No edema Pulses- Radial, DP- 2+        Assessment & Plan:    Abdominal pain- will send to ED for evaluation of RLQ pain, ? Appendicitis, s/p hysterectomy doubt pelvic abnormality, unable to given urine sample in office . No cardiac issues

## 2012-01-30 NOTE — ED Provider Notes (Signed)
History   This chart was scribed for Lenis Nettleton B. Bernette Mayers, MD by Charolett Bumpers . The patient was seen in room [facility].    CSN: [id number]  Arrival date & time 01/30/12  1527   First MD Initiated Contact with Patient 01/30/12 1555      Chief Complaint  Patient presents with  . Abdominal Pain    (Consider location/radiation/quality/duration/timing/severity/associated sxs/prior treatment) HPI [name] is a 46 y.o. female who presents to the Emergency Department complaining of constant, moderate abdominal pain since last pm that is located in the RLQ. Patient states that her symptoms are worsening. Patient denies any n/v/d or fever. Patient denies any urinary symptoms. Patient states that she was seen by Dr. Lodema Hong this morning and sent here to the ED. Patient states that she is not currently sexually active. Patient reports that she has a h/o a total hysterectomy and cholecystectomy. Patient reports a h/o lupus and low kidney function but is currently at baseline.   Past Medical History  Diagnosis Date  . coronary artery disease     RCA stenting Myoview 2011 EF 30% infarction dilatation without ischemia  . CHF (congestive heart failure), NYHA class III   . Automatic implantable cardiac defibrillator in situ     Medtronic  . Other and unspecified hyperlipidemia   . Cardiomyopathy secondary     Patient has cardiomyopathy out of proportion to her ischemic heart disease  . Depression   . GERD (gastroesophageal reflux disease)   . Diabetes mellitus   . Nicotine abuse   . Insomnia   . Migraines   . Lupus (systemic lupus erythematosus)   . Arthritis of knee   . Myocardial infarction     8 total   . Coronary artery disease     Past Surgical History  Procedure Date  . Cholecystectomy   . Abdominal hysterectomy   . Rotator cuff repair 2002  . Defibrillator placed  2009  . Colonoscopy 07/15/2011    Procedure: COLONOSCOPY;  Surgeon: Arlyce Harman, MD;   Location: [facility];  Service: Endoscopy;  Laterality: N/A;  10:45    Family History  Problem Relation Age of Onset  . Hepatitis Mother 60    HCV  . Liver cancer Mother 29    History  Substance Use Topics  . Smoking status: Current Everyday Smoker -- 0.5 packs/day for 7 years    Types: Cigarettes  . Smokeless tobacco: Not on file  . Alcohol Use: No    OB History    Grav Para Term Preterm Abortions TAB SAB Ect Mult Living                  Review of Systems  Constitutional: Negative for fever and chills.  Gastrointestinal: Positive for abdominal pain. Negative for nausea, vomiting, diarrhea and blood in stool.  Genitourinary: Negative for dysuria, vaginal bleeding and vaginal discharge.  All other systems reviewed and are negative.    Allergies  Bee venom and Cortisone  Home Medications   Current Outpatient Rx  Name Route Sig Dispense Refill  . ATORVASTATIN CALCIUM 80 MG PO TABS Oral Take 1 tablet (80 mg total) by mouth daily. 30 tablet 3  . CETIRIZINE HCL 10 MG PO TABS Oral Take 10 mg by mouth daily as needed.    Marland Kitchen FLUTICASONE PROPIONATE 50 MCG/ACT NA SUSP Nasal Place 2 sprays into the nose daily. 16 g 2  . METOPROLOL TARTRATE 25 MG PO TABS Oral Take 1 tablet (25  mg total) by mouth 2 (two) times daily. 60 tablet 12  . MYCOPHENOLATE MOFETIL 250 MG PO CAPS Oral Take 250 mg by mouth daily.     . OXYCODONE-ACETAMINOPHEN 5-500 MG PO CAPS Oral Take 1 capsule by mouth every 4 (four) hours as needed. For pain      BP 113/72  Pulse 84  Temp 98.1 F (36.7 C) (Oral)  Resp 18  Ht 5\' 6"  (1.676 m)  Wt 216 lb (97.977 kg)  BMI 34.86 kg/m2  SpO2 96%  Physical Exam  Nursing note and vitals reviewed. Constitutional: She is oriented to person, place, and time. She appears well-developed and well-nourished. No distress.  HENT:  Head: Normocephalic and atraumatic.  Eyes: EOM are normal. Pupils are equal, round, and reactive to light.  Neck: Neck supple. No tracheal  deviation present.  Cardiovascular: Normal rate, regular rhythm and normal heart sounds.   Pulmonary/Chest: Effort normal and breath sounds normal. No respiratory distress.  Abdominal: Soft. Bowel sounds are normal. She exhibits no distension. There is tenderness in the right lower quadrant. There is guarding.  Musculoskeletal: Normal range of motion. She exhibits no edema.  Neurological: She is alert and oriented to person, place, and time. No sensory deficit.  Skin: Skin is warm and dry.  Psychiatric: She has a normal mood and affect. Her behavior is normal.    ED Course  Procedures (including critical care time)  DIAGNOSTIC STUDIES: Oxygen Saturation is 96% on room air, adequate by my interpretation.    COORDINATION OF CARE:  1604: Discussed planned course of treatment with the patient, who is agreeable at this time.  1615: Medication Orders: Ondansetron (Zofran) injection 4 mg-once; Hydromorphone (Dilaudid) injection 1 mg-once.    Labs Reviewed  URINALYSIS, ROUTINE W REFLEX MICROSCOPIC - Abnormal; Notable for the following:    Color, Urine STRAW (*)     APPearance HAZY (*)     Specific Gravity, Urine <1.005 (*)     Hgb urine dipstick TRACE (*)     Leukocytes, UA MODERATE (*)     All other components within normal limits  COMPREHENSIVE METABOLIC PANEL - Abnormal; Notable for the following:    Potassium 3.4 (*)     All other components within normal limits  LIPASE, BLOOD - Abnormal; Notable for the following:    Lipase 99 (*)     All other components within normal limits  URINE MICROSCOPIC-ADD ON - Abnormal; Notable for the following:    Squamous Epithelial / LPF FEW (*)     Bacteria, UA FEW (*)     All other components within normal limits  CBC  DIFFERENTIAL   Ct Abdomen Pelvis W Contrast  01/30/2012  *RADIOLOGY REPORT*  Clinical Data: Right lower quadrant abdominal pain.  History of cholecystectomy and hysterectomy.  Bilateral ovarian resection.  CT ABDOMEN AND PELVIS  WITH CONTRAST  Technique:  Multidetector CT imaging of the abdomen and pelvis was performed following the standard protocol during bolus administration of intravenous contrast.  Contrast: OMNIPAQUE IOHEXOL 300 MG/ML  SOLN  Comparison: None.  Findings: Lung Bases: Partial visualization of the heart demonstrates density in the right coronary artery, likely representing stent.  Cardiac pacemaker is partially visualized. Lung bases clear.  Liver:  Fatty liver.  There is an area of enhancement in the left hepatic lobe on the initial images which is not present on the delayed images, compatible with transient hepatic attenuation difference.  In the setting of fatty liver, this probably represents a small  area of focal fatty sparing.  Spleen:  Normal.  Gallbladder:  Surgically absent.  Common bile duct:  Within normal limits.  Pancreas:  Normal.  Adrenal glands:  Normal.  Kidneys:  Simple right upper pole renal cyst measuring 23 mm x 17 mm.  No stones or obstruction.  Normal delayed excretion.  Both ureters are normal.  Stomach:  Distended with contrast.  Normal.  Small bowel:  Normal.  Colon:   Normal appendix.  No inflammatory changes of the colon or mass lesion.  Pelvic Genitourinary:  Hysterectomy.  Urinary bladder normal.  No free fluid.  Bones:  Mild SI joint degenerative disease with subchondral sclerosis along the inferior synovial portion of the joint.  No aggressive osseous lesions are present.  Vasculature: No acute abnormality.  Aortic and iliofemoral atherosclerosis is present.  IMPRESSION:  1.  No acute abnormality. 2.  Cholecystectomy, hysterectomy and probable right coronary artery stent. 3.  Simple right renal cyst. 4.  Fatty liver.  Original Report Authenticated By: Andreas Newport, M.D.     1. Trichomoniasis       MDM  Lower abdominal pain since last night, concern for appendicitis on exam, but CT is negative. UA shows trichomonas, patient states has not been sexually active in 5 months.  Will check pelvic exam and treat empirically for GC/C as well.   I personally performed the services described in the documentation, which were scribed in my presence. The recorded information has been reviewed and considered.           Danni Shima B. Bernette Mayers, MD 01/30/12 1815

## 2012-01-30 NOTE — ED Notes (Signed)
Pt stable at discharge; questions answered and pt educated about dx

## 2012-02-01 LAB — URINE CULTURE
Colony Count: NO GROWTH
Culture: NO GROWTH

## 2012-02-09 ENCOUNTER — Encounter (HOSPITAL_COMMUNITY): Payer: Self-pay | Admitting: *Deleted

## 2012-02-09 ENCOUNTER — Emergency Department (HOSPITAL_COMMUNITY): Payer: Medicare Other

## 2012-02-09 ENCOUNTER — Emergency Department (HOSPITAL_COMMUNITY)
Admission: EM | Admit: 2012-02-09 | Discharge: 2012-02-09 | Disposition: A | Discharge status: Home / Self Care | Payer: Medicare Other | Attending: Emergency Medicine | Admitting: Emergency Medicine

## 2012-02-09 DIAGNOSIS — R109 Unspecified abdominal pain: Secondary | ICD-10-CM

## 2012-02-09 DIAGNOSIS — K7689 Other specified diseases of liver: Secondary | ICD-10-CM | POA: Insufficient documentation

## 2012-02-09 DIAGNOSIS — K219 Gastro-esophageal reflux disease without esophagitis: Secondary | ICD-10-CM | POA: Insufficient documentation

## 2012-02-09 DIAGNOSIS — M329 Systemic lupus erythematosus, unspecified: Secondary | ICD-10-CM | POA: Insufficient documentation

## 2012-02-09 DIAGNOSIS — I428 Other cardiomyopathies: Secondary | ICD-10-CM | POA: Insufficient documentation

## 2012-02-09 DIAGNOSIS — E119 Type 2 diabetes mellitus without complications: Secondary | ICD-10-CM | POA: Insufficient documentation

## 2012-02-09 DIAGNOSIS — I252 Old myocardial infarction: Secondary | ICD-10-CM | POA: Insufficient documentation

## 2012-02-09 DIAGNOSIS — R079 Chest pain, unspecified: Secondary | ICD-10-CM

## 2012-02-09 DIAGNOSIS — M549 Dorsalgia, unspecified: Secondary | ICD-10-CM | POA: Insufficient documentation

## 2012-02-09 DIAGNOSIS — I509 Heart failure, unspecified: Secondary | ICD-10-CM | POA: Insufficient documentation

## 2012-02-09 DIAGNOSIS — Z79899 Other long term (current) drug therapy: Secondary | ICD-10-CM | POA: Insufficient documentation

## 2012-02-09 DIAGNOSIS — F17200 Nicotine dependence, unspecified, uncomplicated: Secondary | ICD-10-CM | POA: Insufficient documentation

## 2012-02-09 DIAGNOSIS — R1011 Right upper quadrant pain: Secondary | ICD-10-CM | POA: Insufficient documentation

## 2012-02-09 LAB — COMPREHENSIVE METABOLIC PANEL
ALT: 23 U/L (ref 0–35)
AST: 27 U/L (ref 0–37)
Albumin: 3.7 g/dL (ref 3.5–5.2)
Alkaline Phosphatase: 96 U/L (ref 39–117)
BUN: 5 mg/dL — ABNORMAL LOW (ref 6–23)
CO2: 24 mEq/L (ref 19–32)
Calcium: 9.6 mg/dL (ref 8.4–10.5)
Chloride: 104 mEq/L (ref 96–112)
Creatinine, Ser: 0.66 mg/dL (ref 0.50–1.10)
GFR calc Af Amer: >90 mL/min (ref >90)
GFR calc non Af Amer: >90 mL/min (ref >90)
Glucose, Bld: 127 mg/dL — ABNORMAL HIGH (ref 70–99)
Potassium: 3.7 mEq/L (ref 3.5–5.1)
Sodium: 139 mEq/L (ref 135–145)
Total Bilirubin: 0.2 mg/dL — ABNORMAL LOW (ref 0.3–1.2)
Total Protein: 8.3 g/dL (ref 6.0–8.3)

## 2012-02-09 LAB — LIPASE, BLOOD: Lipase: 114 U/L — ABNORMAL HIGH (ref 11–59)

## 2012-02-09 LAB — DIFFERENTIAL
Basophils Relative: 1 % (ref 0–1)
Lymphs Abs: 2.9 10*3/uL (ref 0.7–4.0)
Monocytes Relative: 6 % (ref 3–12)
Neutro Abs: 2 10*3/uL (ref 1.7–7.7)
Neutrophils Relative %: 36 % — ABNORMAL LOW (ref 43–77)

## 2012-02-09 LAB — URINALYSIS, ROUTINE W REFLEX MICROSCOPIC
Bilirubin Urine: NEGATIVE
Glucose, UA: NEGATIVE mg/dL
Ketones, ur: NEGATIVE mg/dL
Leukocytes, UA: NEGATIVE
Nitrite: NEGATIVE
Protein, ur: NEGATIVE mg/dL
Specific Gravity, Urine: 1.03 (ref 1.005–1.030)
Urobilinogen, UA: 0.2 mg/dL (ref 0.0–1.0)
pH: 6 (ref 5.0–8.0)

## 2012-02-09 LAB — CBC
HCT: 40.6 % (ref 36.0–46.0)
Hemoglobin: 13.4 g/dL (ref 12.0–15.0)
MCH: 27.5 pg (ref 26.0–34.0)
MCHC: 33 g/dL (ref 30.0–36.0)
MCV: 83.4 fL (ref 78.0–100.0)
Platelets: 269 10*3/uL (ref 150–400)
RBC: 4.87 MIL/uL (ref 3.87–5.11)
RDW: 13.5 % (ref 11.5–15.5)
WBC: 5.6 10*3/uL (ref 4.0–10.5)

## 2012-02-09 MED ORDER — HYDROMORPHONE HCL PF 1 MG/ML IJ SOLN
1.0000 mg | Freq: Once | INTRAMUSCULAR | Status: AC
  Administered 2012-02-09: 1 mg via INTRAVENOUS
  Filled 2012-02-09: qty 1

## 2012-02-09 MED ORDER — SODIUM CHLORIDE 0.9 % IV BOLUS (SEPSIS)
250.0000 mL | Freq: Once | INTRAVENOUS | Status: AC
  Administered 2012-02-09: 250 mL via INTRAVENOUS

## 2012-02-09 MED ORDER — SODIUM CHLORIDE 0.9 % IV SOLN
INTRAVENOUS | Status: DC
  Administered 2012-02-09: 10:00:00 via INTRAVENOUS

## 2012-02-09 MED ORDER — IOHEXOL 300 MG/ML  SOLN
100.0000 mL | Freq: Once | INTRAMUSCULAR | Status: AC | PRN
  Administered 2012-02-09: 100 mL via INTRAVENOUS

## 2012-02-09 MED ORDER — NAPROXEN 500 MG PO TABS: 500.0000 mg | ORAL_TABLET | Freq: Two times a day (BID) | ORAL | Status: DC

## 2012-02-09 MED ORDER — HYDROCODONE-ACETAMINOPHEN 5-325 MG PO TABS: 1.0000 | ORAL_TABLET | Freq: Four times a day (QID) | ORAL | Status: AC | PRN

## 2012-02-09 MED ORDER — ONDANSETRON HCL 4 MG/2ML IJ SOLN
4.0000 mg | Freq: Once | INTRAMUSCULAR | Status: AC
  Administered 2012-02-09: 4 mg via INTRAVENOUS
  Filled 2012-02-09: qty 2

## 2012-02-09 NOTE — ED Provider Notes (Signed)
History  This chart was scribed for Rachel Jakes, MD by Bennett Scrape. This patient was seen in room [facility] and the patient's care was started at 8:58AM.  CSN: [id number]  Arrival date & time 02/09/12  0844   First MD Initiated Contact with Patient 02/09/12 [phone]      Chief Complaint  Patient presents with  . Back Pain     Patient is a 46 y.o. female presenting with back pain. The history is provided by the patient. No language interpreter was used.  Back Pain  This is a new problem. The current episode started yesterday. The problem occurs constantly. The problem has been gradually worsening. The pain is associated with no known injury. Radiates to: RUQ. Pertinent negatives include no chest pain, no fever, no headaches, no abdominal pain and no dysuria.    [name] is a 46 y.o. female who presents to the Emergency Department complaining of approximately 21 hours of gradual onset, gradually worsening, constant right sided mid back pain located around the ribs that radiates around to the RUQ. The pain is described as a sharp, aching sensation that is worse with coughing, movement and deep breathing and improved with certain positions. She rates her pain a 10 out of 10 currently. She reports that she originally thought it was due to gas, so she reports taking one goody powder and Tums at home with mild improvement symptoms. She denies having any recent injuries to that area. She has a h/o lupus but denies similarity between the symptoms. She denies chest pain, SOB, nausea, emesis, sore throat, dysuria, fever and rash as associated symptoms. She has a h/o CAD, CHF, GERD, DM, and MI. She is a current everyday smoker and occasional alcohol user.  Dr. Lodema Hong is her PCP.  Past Medical History  Diagnosis Date  . coronary artery disease     RCA stenting Myoview 2011 EF 30% infarction dilatation without ischemia  . CHF (congestive heart failure), NYHA class III   . Automatic  implantable cardiac defibrillator in situ     [employer]  . Other and unspecified hyperlipidemia   . Cardiomyopathy secondary     Patient has cardiomyopathy out of proportion to her ischemic heart disease  . Depression   . GERD (gastroesophageal reflux disease)   . Nicotine abuse   . Insomnia   . Migraines   . Lupus (systemic lupus erythematosus)   . Arthritis of knee   . Myocardial infarction     8 total   . Coronary artery disease   . Diabetes mellitus     Past Surgical History  Procedure Date  . Cholecystectomy   . Abdominal hysterectomy   . Rotator cuff repair 2002  . Defibrillator placed  2009  . Colonoscopy 07/15/2011    Procedure: COLONOSCOPY;  Surgeon: Arlyce Harman, MD;  Location: [facility];  Service: Endoscopy;  Laterality: N/A;  10:45    Family History  Problem Relation Age of Onset  . Hepatitis Mother 60    HCV  . Liver cancer Mother 41    History  Substance Use Topics  . Smoking status: Current Everyday Smoker -- 0.5 packs/day for 7 years    Types: Cigarettes  . Smokeless tobacco: Not on file  . Alcohol Use: Yes     occasionaly    No OB history provided.  Review of Systems  Constitutional: Negative for fever and chills.  HENT: Negative for congestion, sore throat and neck pain.   Eyes: Negative  for visual disturbance.  Respiratory: Negative for cough and shortness of breath.   Cardiovascular: Negative for chest pain.  Gastrointestinal: Negative for nausea, vomiting, abdominal pain and diarrhea.  Genitourinary: Negative for dysuria and hematuria.  Musculoskeletal: Positive for back pain.  Skin: Negative for rash.  Neurological: Negative for headaches.  Hematological: Does not bruise/bleed easily.  Psychiatric/Behavioral: Negative for confusion.    Allergies  Bee venom and Cortisone  Home Medications   Current Outpatient Rx  Name Route Sig Dispense Refill  . GOODYS BODY PAIN PO Oral Take 1 Package by mouth as needed. For pain    .  CALCIUM CARBONATE ANTACID 500 MG PO CHEW Oral Chew 1 tablet by mouth as needed. For indigestion    . CETIRIZINE HCL 10 MG PO TABS Oral Take 10 mg by mouth daily as needed. For allergies    . FLUTICASONE PROPIONATE 50 MCG/ACT NA SUSP Nasal Place 2 sprays into the nose daily as needed. For congestion    . METOPROLOL TARTRATE 25 MG PO TABS Oral Take 1 tablet (25 mg total) by mouth 2 (two) times daily. 60 tablet 12  . MYCOPHENOLATE MOFETIL 250 MG PO CAPS Oral Take 250 mg by mouth 2 (two) times daily.     . OXYCODONE-ACETAMINOPHEN 5-500 MG PO CAPS Oral Take 1 capsule by mouth every 4 (four) hours as needed. For pain    . ATORVASTATIN CALCIUM 80 MG PO TABS Oral Take 1 tablet (80 mg total) by mouth daily. 30 tablet 3  . HYDROCODONE-ACETAMINOPHEN 5-325 MG PO TABS Oral Take 1-2 tablets by mouth every 6 (six) hours as needed for pain. 10 tablet 0  . NAPROXEN 500 MG PO TABS Oral Take 1 tablet (500 mg total) by mouth 2 (two) times daily. 14 tablet 0    Triage Vitals: BP 116/76  Pulse 76  Temp 98 F (36.7 C) (Oral)  Resp 18  Ht 5\' 6"  (1.676 m)  Wt 216 lb (97.977 kg)  BMI 34.86 kg/m2  SpO2 100%  Physical Exam  Nursing note and vitals reviewed. Constitutional: She is oriented to person, place, and time. She appears well-developed and well-nourished. No distress.  HENT:  Head: Normocephalic and atraumatic.  Eyes: EOM are normal.  Neck: Neck supple. No tracheal deviation present.  Cardiovascular: Normal rate and regular rhythm.   No murmur heard. Pulmonary/Chest: Effort normal and breath sounds normal. No respiratory distress.  Abdominal: Soft. Bowel sounds are normal. There is tenderness (RUQ). There is no guarding.  Musculoskeletal: Normal range of motion. She exhibits no edema (no lower leg edema).       Right back tenderness along the posterior ribs, pt moves all extremities   Lymphadenopathy:    She has no cervical adenopathy.  Neurological: She is alert and oriented to person, place, and  time.  Skin: Skin is warm and dry.  Psychiatric: She has a normal mood and affect. Her behavior is normal.    ED Course  Procedures (including critical care time)  DIAGNOSTIC STUDIES: Oxygen Saturation is 100% on room air, normal by my interpretation.    COORDINATION OF CARE: 9:13AM-Discussed treatment plan which with pt and pt agreed to plan.   Labs Reviewed  DIFFERENTIAL - Abnormal; Notable for the following:    Neutrophils Relative 36 (*)     Lymphocytes Relative 52 (*)     All other components within normal limits  COMPREHENSIVE METABOLIC PANEL - Abnormal; Notable for the following:    Glucose, Bld 127 (*)  BUN 5 (*)     Total Bilirubin 0.2 (*)     All other components within normal limits  URINALYSIS, ROUTINE W REFLEX MICROSCOPIC - Abnormal; Notable for the following:    Hgb urine dipstick SMALL (*)     All other components within normal limits  URINE MICROSCOPIC-ADD ON - Abnormal; Notable for the following:    Squamous Epithelial / LPF MANY (*)     All other components within normal limits  LIPASE, BLOOD - Abnormal; Notable for the following:    Lipase 114 (*)     All other components within normal limits  CBC  D-DIMER, QUANTITATIVE  TROPONIN I   Dg Chest 2 View  02/09/2012  *RADIOLOGY REPORT*  Clinical Data: Back pain.  Right posterior rib pain.  CHEST - 2 VIEW  Comparison: 04/13/2011.  Findings: Trachea is midline.  ICD lead tip is stable in position. Heart size stable.  There may be mild atelectasis at the right lung base.  Lungs are otherwise clear.  No pleural fluid.  IMPRESSION: Question mild right basilar atelectasis.  Original Report Authenticated By: Reyes Ivan, M.D.   Ct Abdomen Pelvis W Contrast  02/09/2012  *RADIOLOGY REPORT*  Clinical Data: Right upper quadrant pain.  CT ABDOMEN AND PELVIS WITH CONTRAST  Technique:  Multidetector CT imaging of the abdomen and pelvis was performed following the standard protocol during bolus administration of  intravenous contrast.  Contrast: OMNIPAQUE IOHEXOL 300 MG/ML  SOLN  Comparison: 01/30/2012  Findings: Pacer wires noted in the right side of the heart. Lung bases are clear.  No effusions.  Heart is normal size.  Diffuse fatty infiltration of the liver.  Prior cholecystectomy. Spleen, pancreas, adrenals and left kidney are unremarkable. Benign-appearing cyst in the posterior right renal mid pole.  No hydronephrosis.  Stomach, large and small bowel are unremarkable.  No free fluid, free air or adenopathy.  Small amount of air within the urinary bladder, presumably from recent catheterization.  Appendix is visualized and is normal.  Prior hysterectomy.  No adnexal masses.  No acute bony abnormality.  IMPRESSION: Diffuse fatty infiltration of the liver.  No acute findings.  No change since recent study.  Original Report Authenticated By: Cyndie Chime, M.D.     Date: 02/09/2012  Rate: 73  Rhythm: normal sinus rhythm  QRS Axis: normal  Intervals: normal  ST/T Wave abnormalities: nonspecific T wave changes  Conduction Disutrbances:nonspecific intraventricular conduction delay  Narrative Interpretation:   Old EKG Reviewed: unchanged Prolong the QT QT corrected 484 ms. Results for orders placed during the hospital encounter of 02/09/12  CBC      Component Value Range   WBC 5.6  4.0 - 10.5 K/uL   RBC 4.87  3.87 - 5.11 MIL/uL   Hemoglobin 13.4  12.0 - 15.0 g/dL   HCT 16.1  09.6 - 04.5 %   MCV 83.4  78.0 - 100.0 fL   MCH 27.5  26.0 - 34.0 pg   MCHC 33.0  30.0 - 36.0 g/dL   RDW 40.9  81.1 - 91.4 %   Platelets 269  150 - 400 K/uL  DIFFERENTIAL      Component Value Range   Neutrophils Relative 36 (*) 43 - 77 %   Neutro Abs 2.0  1.7 - 7.7 K/uL   Lymphocytes Relative 52 (*) 12 - 46 %   Lymphs Abs 2.9  0.7 - 4.0 K/uL   Monocytes Relative 6  3 - 12 %   Monocytes  Absolute 0.3  0.1 - 1.0 K/uL   Eosinophils Relative 5  0 - 5 %   Eosinophils Absolute 0.3  0.0 - 0.7 K/uL   Basophils Relative 1  0  - 1 %   Basophils Absolute 0.1  0.0 - 0.1 K/uL  COMPREHENSIVE METABOLIC PANEL      Component Value Range   Sodium 139  135 - 145 mEq/L   Potassium 3.7  3.5 - 5.1 mEq/L   Chloride 104  96 - 112 mEq/L   CO2 24  19 - 32 mEq/L   Glucose, Bld 127 (*) 70 - 99 mg/dL   BUN 5 (*) 6 - 23 mg/dL   Creatinine, Ser 1.61  0.50 - 1.10 mg/dL   Calcium 9.6  8.4 - 09.6 mg/dL   Total Protein 8.3  6.0 - 8.3 g/dL   Albumin 3.7  3.5 - 5.2 g/dL   AST 27  0 - 37 U/L   ALT 23  0 - 35 U/L   Alkaline Phosphatase 96  39 - 117 U/L   Total Bilirubin 0.2 (*) 0.3 - 1.2 mg/dL   GFR calc non Af Amer >90  >90 mL/min   GFR calc Af Amer >90  >90 mL/min  URINALYSIS, ROUTINE W REFLEX MICROSCOPIC      Component Value Range   Color, Urine YELLOW  YELLOW   APPearance CLEAR  CLEAR   Specific Gravity, Urine 1.030  1.005 - 1.030   pH 6.0  5.0 - 8.0   Glucose, UA NEGATIVE  NEGATIVE mg/dL   Hgb urine dipstick SMALL (*) NEGATIVE   Bilirubin Urine NEGATIVE  NEGATIVE   Ketones, ur NEGATIVE  NEGATIVE mg/dL   Protein, ur NEGATIVE  NEGATIVE mg/dL   Urobilinogen, UA 0.2  0.0 - 1.0 mg/dL   Nitrite NEGATIVE  NEGATIVE   Leukocytes, UA NEGATIVE  NEGATIVE  D-DIMER, QUANTITATIVE      Component Value Range   D-Dimer, Quant 0.29  0.00 - 0.48 ug/mL-FEU  TROPONIN I      Component Value Range   Troponin I <0.30  <0.30 ng/mL  URINE MICROSCOPIC-ADD ON      Component Value Range   Squamous Epithelial / LPF MANY (*) RARE   WBC, UA 0-2  <3 WBC/hpf   RBC / HPF 0-2  <3 RBC/hpf  LIPASE, BLOOD      Component Value Range   Lipase 114 (*) 11 - 59 U/L     1. Chest pain   2. Abdominal pain       MDM   Workup in the emergency department without significant findings her lipase remains elevated spelled it in the past and nothing based on the CAT scan that this is consistent with pancreatitis. D-dimer was nonelevated not consistent with a blood clot chest x-ray was negative for pneumonia or pneumothorax troponin was normal EKG without  specific findings. Electrolytes and LFTs without significant abnormalities urinalysis negative for urinary tract infection no leukocytosis no anemia. Will discharge home with diagnosis of abdominal pain chest wall pain treatment anti-inflammatory and pain medicine called the primary care Dr. Next 2 days. CT scan was also normal earlier this month.     I personally performed the services described in this documentation, which was scribed in my presence. The recorded information has been reviewed and considered.    Rachel Jakes, MD 02/09/12 [phone]

## 2012-02-09 NOTE — ED Notes (Signed)
Pt c/o rt mid back pain that radiates to rt upper abd pain x 2 days. Pt states the pain is at its worse when she takes a deep breath or coughs. Pt denies any injury, but states it feels like a pulled muscle. Pt denies any problems urinating. Pt states she slept on a pull out sofa a couple of days ago and that may be causing her the pain.

## 2012-02-09 NOTE — Discharge Instructions (Signed)
Dr. In the next few days. Take anti-inflammatory pain medicines as directed. Labs without any new or specific findings no evidence of heart attack CT of the abdomen was negative. Lipase is still elevated has been elevated in the past but CT doesn't show a cause for that. Return for new or worse symptoms.

## 2012-02-09 NOTE — ED Notes (Signed)
Pt c/o pain in the right side of her mid back since yesterday. States that it is worse with coughing or deep breathing. Denies injury.

## 2012-02-23 ENCOUNTER — Encounter: Payer: Self-pay | Admitting: Family Medicine

## 2012-02-23 ENCOUNTER — Ambulatory Visit (INDEPENDENT_AMBULATORY_CARE_PROVIDER_SITE_OTHER): Payer: Medicare Other | Admitting: Family Medicine

## 2012-02-23 VITALS — HR 80 | Resp 16 | Ht 66.0 in | Wt 215.1 lb

## 2012-02-23 DIAGNOSIS — R21 Rash and other nonspecific skin eruption: Secondary | ICD-10-CM

## 2012-02-23 MED ORDER — PREDNISONE 10 MG PO TABS: ORAL_TABLET | ORAL | Status: DC

## 2012-02-23 MED ORDER — PERMETHRIN 5 % EX CREA: TOPICAL_CREAM | CUTANEOUS | Status: DC

## 2012-02-23 NOTE — Progress Notes (Signed)
  Subjective:    Patient ID: [name], female    DOB: 13-Jun-1966, 46 y.o.   MRN: [id number]  HPI  Pt presents with rash  X 3 days, she was staying in hotel in [place] visiting her family members when she noticed red bumps and itching on her back and her hands. No sick contacts, no Fever, no sores in mouth.She has benadryl at home  Review of Systems  GEN- denies fatigue, fever, weight loss,weakness, recent illness CVS- denies chest pain, palpitations RESP- denies SOB, cough, wheeze ABD- denies N/V, change in stools, abd pain MSK- denies joint pain, muscle aches, injury       Objective:   Physical Exam GEN- NAD, alert and oriented x3 HEENT- PERRL, EOMI, MMM, oropharynx clear Skin- Multiplle Erythematous insect bite lesions across back and beneath right axilla and on ventral surface of hands , palms spared, no lesions on legs , no fluctuant lesions EXT- No edema Pulses- Radial 2+        Assessment & Plan:   Rash-- ? Bed bug exposure vs other insect, will treat with permethrin, oral prednisone given which pt has taken before. Benadryl and topical steroid cream , she is to call for signs of infection

## 2012-02-23 NOTE — Patient Instructions (Signed)
Use the cream for the rash as directed, put on at bedtime Wash all clothing in hot water Use the prednisone for itching  Call if you do not improve

## 2012-03-29 ENCOUNTER — Telehealth: Payer: Self-pay

## 2012-03-29 ENCOUNTER — Ambulatory Visit: Payer: Medicare Other | Admitting: Family Medicine

## 2012-03-29 DIAGNOSIS — M899 Disorder of bone, unspecified: Secondary | ICD-10-CM

## 2012-03-29 DIAGNOSIS — R5381 Other malaise: Secondary | ICD-10-CM

## 2012-03-29 DIAGNOSIS — R5383 Other fatigue: Secondary | ICD-10-CM

## 2012-03-29 DIAGNOSIS — E785 Hyperlipidemia, unspecified: Secondary | ICD-10-CM

## 2012-03-29 DIAGNOSIS — E669 Obesity, unspecified: Secondary | ICD-10-CM

## 2012-03-29 DIAGNOSIS — M949 Disorder of cartilage, unspecified: Secondary | ICD-10-CM

## 2012-03-29 DIAGNOSIS — E119 Type 2 diabetes mellitus without complications: Secondary | ICD-10-CM

## 2012-03-29 NOTE — Telephone Encounter (Signed)
Labs re-ordered

## 2012-03-30 ENCOUNTER — Encounter: Payer: Self-pay | Admitting: Family Medicine

## 2012-03-30 ENCOUNTER — Ambulatory Visit (INDEPENDENT_AMBULATORY_CARE_PROVIDER_SITE_OTHER): Payer: Medicare Other | Admitting: Family Medicine

## 2012-03-30 ENCOUNTER — Other Ambulatory Visit (HOSPITAL_COMMUNITY)
Admission: RE | Admit: 2012-03-30 | Discharge: 2012-03-30 | Disposition: A | Discharge status: Home / Self Care | Payer: Medicare Other | Source: Ambulatory Visit | Attending: Family Medicine | Admitting: Family Medicine

## 2012-03-30 VITALS — BP 106/68 | HR 92 | Resp 18 | Ht 66.0 in | Wt 215.0 lb

## 2012-03-30 DIAGNOSIS — Z113 Encounter for screening for infections with a predominantly sexual mode of transmission: Secondary | ICD-10-CM | POA: Insufficient documentation

## 2012-03-30 DIAGNOSIS — N3000 Acute cystitis without hematuria: Secondary | ICD-10-CM

## 2012-03-30 DIAGNOSIS — E119 Type 2 diabetes mellitus without complications: Secondary | ICD-10-CM

## 2012-03-30 DIAGNOSIS — N760 Acute vaginitis: Secondary | ICD-10-CM

## 2012-03-30 DIAGNOSIS — I10 Essential (primary) hypertension: Secondary | ICD-10-CM

## 2012-03-30 DIAGNOSIS — E785 Hyperlipidemia, unspecified: Secondary | ICD-10-CM

## 2012-03-30 DIAGNOSIS — E669 Obesity, unspecified: Secondary | ICD-10-CM

## 2012-03-30 LAB — CBC WITH DIFFERENTIAL/PLATELET
Basophils Absolute: 0.1 10*3/uL (ref 0.0–0.1)
HCT: 39.3 % (ref 36.0–46.0)
Hemoglobin: 13.4 g/dL (ref 12.0–15.0)
Lymphocytes Relative: 49 % — ABNORMAL HIGH (ref 12–46)
Lymphs Abs: 3.4 10*3/uL (ref 0.7–4.0)
Monocytes Absolute: 0.3 10*3/uL (ref 0.1–1.0)
Monocytes Relative: 4 % (ref 3–12)
Neutro Abs: 3 10*3/uL (ref 1.7–7.7)
RBC: 4.93 MIL/uL (ref 3.87–5.11)
RDW: 14 % (ref 11.5–15.5)
WBC: 6.9 10*3/uL (ref 4.0–10.5)

## 2012-03-30 LAB — HEMOGLOBIN A1C: Mean Plasma Glucose: 131 mg/dL — ABNORMAL HIGH (ref <117)

## 2012-03-30 LAB — LIPID PANEL: HDL: 27 mg/dL — ABNORMAL LOW (ref >39)

## 2012-03-30 LAB — COMPLETE METABOLIC PANEL WITH GFR
ALT: 23 U/L (ref 0–35)
CO2: 27 mEq/L (ref 19–32)
Calcium: 9.3 mg/dL (ref 8.4–10.5)
Chloride: 108 mEq/L (ref 96–112)
Creat: 0.73 mg/dL (ref 0.50–1.10)
GFR, Est African American: >89 mL/min
Total Protein: 7.5 g/dL (ref 6.0–8.3)

## 2012-03-30 LAB — POCT URINALYSIS DIPSTICK
Protein, UA: NEGATIVE
Spec Grav, UA: >=1.03
Urobilinogen, UA: 0.2
pH, UA: 6

## 2012-03-30 LAB — VITAMIN D 25 HYDROXY (VIT D DEFICIENCY, FRACTURES): Vit D, 25-Hydroxy: 10 ng/mL — ABNORMAL LOW (ref 30–89)

## 2012-03-30 LAB — TSH: TSH: 1.596 u[IU]/mL (ref 0.350–4.500)

## 2012-03-30 MED ORDER — FLUCONAZOLE 150 MG PO TABS: ORAL_TABLET | ORAL | Status: AC

## 2012-03-30 MED ORDER — CIPROFLOXACIN HCL 500 MG PO TABS: 500.0000 mg | ORAL_TABLET | Freq: Two times a day (BID) | ORAL | Status: AC

## 2012-03-30 MED ORDER — ERGOCALCIFEROL 1.25 MG (50000 UT) PO CAPS: 50000.0000 [IU] | ORAL_CAPSULE | ORAL | Status: DC

## 2012-03-30 NOTE — Patient Instructions (Addendum)
Start antibiotics Diabetes labs look good Continue current medication Work on the diet- avoid fried, fatty foods, fast food. You have fatty liver which can be reversed with change in diet.  F/U with Dr. Lodema Hong 2 months

## 2012-03-30 NOTE — Progress Notes (Signed)
  Subjective:    Patient ID: [name], female    DOB: 1965/12/02, 46 y.o.   MRN: [id number]  HPI  Recurrent lower abdominal pain, sent to ER a few months ago, diagbosed with Trichomonas via urine test, neg CT abd/pelvis, + discharge, mild dysuria with change in urine from clear to cloudy, also had a little blood in urine. No current sexual partner Labs reviewed with pt  DM- no meds currently     Review of Systems   GEN- denies fatigue, fever, weight loss,weakness, recent illness HEENT- denies eye drainage, change in vision, nasal discharge, CVS- denies chest pain, palpitations RESP- denies SOB, cough, wheeze ABD- denies N/V, change in stools, abd pain GU-+ dysuria, +hematuria, dribbling, incontinence, + frequency  MSK- denies joint pain, muscle aches, injury Neuro- denies headache, dizziness, syncope, seizure activity      Objective:   Physical Exam GEN- NAD, alert and oriented x3 HEENT- PERRL, EOMI, non injected sclera, pink conjunctiva, MMM, oropharynx clear Neck- Supple, no thryomegaly CVS- RRR, no murmur RESP-CTAB ABD-NABS,soft, TTP diffusely > lower quadrants, equivical CVA tenderness, no rebound, no gaurding  EXT- No edema Pulses- Radial, DP- 2+        Assessment & Plan:

## 2012-03-31 ENCOUNTER — Encounter: Payer: Self-pay | Admitting: Family Medicine

## 2012-03-31 DIAGNOSIS — N3000 Acute cystitis without hematuria: Secondary | ICD-10-CM | POA: Insufficient documentation

## 2012-03-31 NOTE — Assessment & Plan Note (Signed)
Encouraged need for weight loss, change in diet, activity

## 2012-03-31 NOTE — Assessment & Plan Note (Signed)
LDL slightly above goal, reiterated low fat, low carb diet and any weight loss

## 2012-03-31 NOTE — Assessment & Plan Note (Signed)
BP at goal 

## 2012-03-31 NOTE — Assessment & Plan Note (Signed)
Antibiotics to be started

## 2012-03-31 NOTE — Assessment & Plan Note (Signed)
A1C looks good, diet controlled, though pt can work on weight loss

## 2012-03-31 NOTE — Assessment & Plan Note (Addendum)
Previous STD, await cultures  Culture positive for trichomonas and BV, will send flagyl

## 2012-04-01 MED ORDER — METRONIDAZOLE 500 MG PO TABS: 500.0000 mg | ORAL_TABLET | Freq: Two times a day (BID) | ORAL | Status: AC

## 2012-04-01 NOTE — Addendum Note (Signed)
Addended by: Milinda Antis F on: 04/01/2012 09:20 PM   Modules accepted: Orders

## 2012-04-02 ENCOUNTER — Encounter: Payer: Self-pay | Admitting: Family Medicine

## 2012-04-14 ENCOUNTER — Encounter: Payer: Self-pay | Admitting: *Deleted

## 2012-04-30 ENCOUNTER — Encounter: Payer: Self-pay | Admitting: Family Medicine

## 2012-04-30 ENCOUNTER — Ambulatory Visit (INDEPENDENT_AMBULATORY_CARE_PROVIDER_SITE_OTHER): Payer: Medicare Other | Admitting: Family Medicine

## 2012-04-30 VITALS — BP 124/76 | HR 84 | Temp 98.5°F | Resp 18 | Ht 66.0 in | Wt 212.0 lb

## 2012-04-30 DIAGNOSIS — Z720 Tobacco use: Secondary | ICD-10-CM

## 2012-04-30 DIAGNOSIS — J209 Acute bronchitis, unspecified: Secondary | ICD-10-CM

## 2012-04-30 DIAGNOSIS — F17200 Nicotine dependence, unspecified, uncomplicated: Secondary | ICD-10-CM

## 2012-04-30 MED ORDER — AZITHROMYCIN 250 MG PO TABS: ORAL_TABLET | ORAL | Status: AC

## 2012-04-30 MED ORDER — GUAIFENESIN-CODEINE 100-10 MG/5ML PO SYRP: 5.0000 mL | ORAL_SOLUTION | Freq: Three times a day (TID) | ORAL | Status: DC | PRN

## 2012-04-30 MED ORDER — FLUCONAZOLE 150 MG PO TABS: 150.0000 mg | ORAL_TABLET | Freq: Once | ORAL | Status: AC

## 2012-04-30 NOTE — Patient Instructions (Signed)
Take antibiotics for the bronchitis  Use the cough medicine  Salt water gargles for your throat  Diflucan for yeast You need quit smoking  F/U 2 months Dr. Lodema Hong

## 2012-05-02 ENCOUNTER — Encounter: Payer: Self-pay | Admitting: Family Medicine

## 2012-05-02 NOTE — Progress Notes (Signed)
  Subjective:    Patient ID: [name], female    DOB: 12-28-1965, 46 y.o.   MRN: [id number]  HPI  Pt presents with cough with production, fatigue, sore throat for past 3 days. +sick contacts with her family members, she has not tried OTC meds because of her heart condition, continues to smoke , feels she is worsening and occasionally feels SOB  Review of Systems - per abpve  GEN- + fatigue, fever, weight loss,weakness, recent illness HEENT- denies eye drainage, change in vision, nasal discharge, CVS- denies chest pain, palpitations RESP- + SOB,+ cough, wheeze ABD- denies N/V, change in stools, abd pain Neuro- denies headache, dizziness, syncope, seizure activity      Objective:   Physical Exam GEN- NAD, alert and oriented x3 HEENT- PERRL, EOMI, non injected sclera, pink conjunctiva, MMM, oropharynx clear, TM clear bialt Neck- Supple, no LAD CVS- RRR, no murmur RESP-course BS mostly clear with cough,wheeze initially cleared with cough, no rales EXT- No edema Pulses- Radial 2+        Assessment & Plan:       Bronchitis- will treat for bronchitis based on pt history of bronchitis, continued smoking and co-morbidites. I fear she may deteriorate without treatment. Zpak and cough syrup, hold on steroids unless she worsens. Advised smoking cessation

## 2012-05-13 ENCOUNTER — Ambulatory Visit (INDEPENDENT_AMBULATORY_CARE_PROVIDER_SITE_OTHER): Payer: Medicare Other | Admitting: *Deleted

## 2012-05-13 DIAGNOSIS — I4901 Ventricular fibrillation: Secondary | ICD-10-CM

## 2012-05-17 LAB — REMOTE ICD DEVICE
BRDY-0002RV: 40 {beats}/min
DEV-0020ICD: NEGATIVE
RV LEAD AMPLITUDE: 10.6 mv
TOT-0006: 20130225000000
TZAT-0001FASTVT: 1
TZAT-0001SLOWVT: 1
TZAT-0001SLOWVT: 2
TZAT-0004FASTVT: 8
TZAT-0013FASTVT: 1
TZAT-0018SLOWVT: NEGATIVE
TZAT-0018SLOWVT: NEGATIVE
TZAT-0019FASTVT: 8 V
TZAT-0019SLOWVT: 8 V
TZAT-0019SLOWVT: 8 V
TZAT-0020FASTVT: 1.6 ms
TZAT-0020SLOWVT: 1.6 ms
TZAT-0020SLOWVT: 1.6 ms
TZON-0005SLOWVT: 12
TZON-0008FASTVT: 0 ms
TZON-0008SLOWVT: 0 ms
TZON-0011AFLUTTER: 70
TZST-0001FASTVT: 2
TZST-0001FASTVT: 4
TZST-0001FASTVT: 6
TZST-0001SLOWVT: 4
TZST-0001SLOWVT: 5
TZST-0002FASTVT: NEGATIVE
TZST-0002FASTVT: NEGATIVE
TZST-0003FASTVT: 35 J
TZST-0003FASTVT: 35 J
TZST-0003SLOWVT: 35 J
TZST-0003SLOWVT: 35 J
TZST-0003SLOWVT: 35 J

## 2012-05-18 NOTE — Progress Notes (Signed)
Remote defib check  

## 2012-06-01 ENCOUNTER — Encounter: Payer: Self-pay | Admitting: *Deleted

## 2012-06-11 ENCOUNTER — Encounter: Payer: Self-pay | Admitting: Internal Medicine

## 2012-06-17 ENCOUNTER — Ambulatory Visit: Payer: Medicare Other | Admitting: Family Medicine

## 2012-06-24 ENCOUNTER — Emergency Department (HOSPITAL_COMMUNITY)
Admission: EM | Admit: 2012-06-24 | Discharge: 2012-06-24 | Disposition: A | Discharge status: Home / Self Care | Payer: Medicare Other | Attending: Emergency Medicine | Admitting: Emergency Medicine

## 2012-06-24 ENCOUNTER — Encounter (HOSPITAL_COMMUNITY): Payer: Self-pay

## 2012-06-24 ENCOUNTER — Emergency Department (HOSPITAL_COMMUNITY): Payer: Medicare Other

## 2012-06-24 DIAGNOSIS — Z79899 Other long term (current) drug therapy: Secondary | ICD-10-CM | POA: Insufficient documentation

## 2012-06-24 DIAGNOSIS — G43909 Migraine, unspecified, not intractable, without status migrainosus: Secondary | ICD-10-CM | POA: Insufficient documentation

## 2012-06-24 DIAGNOSIS — I252 Old myocardial infarction: Secondary | ICD-10-CM | POA: Insufficient documentation

## 2012-06-24 DIAGNOSIS — F3289 Other specified depressive episodes: Secondary | ICD-10-CM | POA: Insufficient documentation

## 2012-06-24 DIAGNOSIS — M549 Dorsalgia, unspecified: Secondary | ICD-10-CM | POA: Insufficient documentation

## 2012-06-24 DIAGNOSIS — G4700 Insomnia, unspecified: Secondary | ICD-10-CM | POA: Insufficient documentation

## 2012-06-24 DIAGNOSIS — F329 Major depressive disorder, single episode, unspecified: Secondary | ICD-10-CM | POA: Insufficient documentation

## 2012-06-24 DIAGNOSIS — R109 Unspecified abdominal pain: Secondary | ICD-10-CM | POA: Insufficient documentation

## 2012-06-24 DIAGNOSIS — F17200 Nicotine dependence, unspecified, uncomplicated: Secondary | ICD-10-CM | POA: Insufficient documentation

## 2012-06-24 DIAGNOSIS — E119 Type 2 diabetes mellitus without complications: Secondary | ICD-10-CM | POA: Insufficient documentation

## 2012-06-24 DIAGNOSIS — I2510 Atherosclerotic heart disease of native coronary artery without angina pectoris: Secondary | ICD-10-CM | POA: Insufficient documentation

## 2012-06-24 DIAGNOSIS — M329 Systemic lupus erythematosus, unspecified: Secondary | ICD-10-CM | POA: Insufficient documentation

## 2012-06-24 DIAGNOSIS — K219 Gastro-esophageal reflux disease without esophagitis: Secondary | ICD-10-CM | POA: Insufficient documentation

## 2012-06-24 DIAGNOSIS — M1710 Unilateral primary osteoarthritis, unspecified knee: Secondary | ICD-10-CM | POA: Insufficient documentation

## 2012-06-24 LAB — CBC WITH DIFFERENTIAL/PLATELET
Basophils Relative: 1 % (ref 0–1)
Eosinophils Absolute: 0.1 10*3/uL (ref 0.0–0.7)
Eosinophils Relative: 2 % (ref 0–5)
Hemoglobin: 13 g/dL (ref 12.0–15.0)
Lymphs Abs: 2.8 10*3/uL (ref 0.7–4.0)
MCH: 27.8 pg (ref 26.0–34.0)
MCHC: 33.8 g/dL (ref 30.0–36.0)
MCV: 82.3 fL (ref 78.0–100.0)
Monocytes Absolute: 0.4 10*3/uL (ref 0.1–1.0)
Monocytes Relative: 6 % (ref 3–12)
RBC: 4.68 MIL/uL (ref 3.87–5.11)

## 2012-06-24 LAB — URINALYSIS, ROUTINE W REFLEX MICROSCOPIC
Bilirubin Urine: NEGATIVE
Glucose, UA: NEGATIVE mg/dL
Hgb urine dipstick: NEGATIVE
Ketones, ur: NEGATIVE mg/dL
Leukocytes, UA: NEGATIVE
Nitrite: NEGATIVE
Protein, ur: NEGATIVE mg/dL
Specific Gravity, Urine: 1.01 (ref 1.005–1.030)
Urobilinogen, UA: 0.2 mg/dL (ref 0.0–1.0)
pH: 5.5 (ref 5.0–8.0)

## 2012-06-24 LAB — COMPREHENSIVE METABOLIC PANEL
Albumin: 3.6 g/dL (ref 3.5–5.2)
Alkaline Phosphatase: 90 U/L (ref 39–117)
BUN: 7 mg/dL (ref 6–23)
Calcium: 9.6 mg/dL (ref 8.4–10.5)
Creatinine, Ser: 0.69 mg/dL (ref 0.50–1.10)
GFR calc Af Amer: >90 mL/min (ref >90)
Glucose, Bld: 116 mg/dL — ABNORMAL HIGH (ref 70–99)
Total Protein: 7.9 g/dL (ref 6.0–8.3)

## 2012-06-24 MED ORDER — SODIUM CHLORIDE 0.9 % IV BOLUS (SEPSIS)
1000.0000 mL | Freq: Once | INTRAVENOUS | Status: AC
  Administered 2012-06-24: 1000 mL via INTRAVENOUS

## 2012-06-24 MED ORDER — IOHEXOL 300 MG/ML  SOLN
100.0000 mL | Freq: Once | INTRAMUSCULAR | Status: AC | PRN
  Administered 2012-06-24: 100 mL via INTRAVENOUS

## 2012-06-24 MED ORDER — HYDROCODONE-ACETAMINOPHEN 5-500 MG PO TABS: 1.0000 | ORAL_TABLET | Freq: Four times a day (QID) | ORAL | Status: DC | PRN

## 2012-06-24 MED ORDER — MORPHINE SULFATE 4 MG/ML IJ SOLN
4.0000 mg | Freq: Once | INTRAMUSCULAR | Status: AC
  Administered 2012-06-24: 4 mg via INTRAVENOUS
  Filled 2012-06-24: qty 1

## 2012-06-24 NOTE — ED Notes (Signed)
CT notified that pt is finished drinking contrast.  

## 2012-06-24 NOTE — ED Provider Notes (Signed)
History  This chart was scribed for Geoffery Lyons, MD by Ardeen Jourdain. This patient was seen in room [facility] and the patient's care was started at 2036.  CSN: [id number]  Arrival date & time 06/24/12  2026   First MD Initiated Contact with Patient 06/24/12 2036      Chief Complaint  Patient presents with  . Abdominal Pain  . Back Pain     The history is provided by the patient. No language interpreter was used.    [name] is a 46 y.o. female who presents to the Emergency Department complaining of gradually worsening abdominal pain with associated back pain that started 1 day ago. She denies bowel or urinary incontinence, fever and chills. She reports taking tylox yesterday for the pain which relieved the symptoms, but when she did the same today she had no relief. She reports having a hysterectomy and a cholecystectomy as well as a defibrillator and stent placement. She denies a history of DM. She has a h/o CAD, CHF, and Lupus. She is a current everyday smoker and occasional alcohol user.    Past Medical History  Diagnosis Date  . coronary artery disease     RCA stenting Myoview 2011 EF 30% infarction dilatation without ischemia  . CHF (congestive heart failure), NYHA class III   . Automatic implantable cardiac defibrillator in situ     Medtronic  . Other and unspecified hyperlipidemia   . Cardiomyopathy secondary     Patient has cardiomyopathy out of proportion to her ischemic heart disease  . Depression   . GERD (gastroesophageal reflux disease)   . Nicotine abuse   . Insomnia   . Migraines   . Lupus (systemic lupus erythematosus)   . Arthritis of knee   . Myocardial infarction     8 total   . Coronary artery disease   . Diabetes mellitus     Past Surgical History  Procedure Date  . Cholecystectomy   . Abdominal hysterectomy   . Rotator cuff repair 2002  . Defibrillator placed  2009  . Colonoscopy 07/15/2011    Procedure: COLONOSCOPY;  Surgeon:  Arlyce Harman, MD;  Location: [facility];  Service: Endoscopy;  Laterality: N/A;  10:45    Family History  Problem Relation Age of Onset  . Hepatitis Mother 60    HCV  . Liver cancer Mother 25    History  Substance Use Topics  . Smoking status: Current Every Day Smoker -- 0.5 packs/day for 7 years    Types: Cigarettes  . Smokeless tobacco: Not on file  . Alcohol Use: Yes     Comment: occasionaly   No OB history available.   Review of Systems  All other systems reviewed and are negative.    A complete 10 system review of systems was obtained and all systems are negative except as noted in the HPI and PMH.    Allergies  Bee venom and Cortisone  Home Medications   Current Outpatient Rx  Name  Route  Sig  Dispense  Refill  . GOODYS BODY PAIN PO   Oral   Take 1 Package by mouth as needed. For pain         . ATORVASTATIN CALCIUM 80 MG PO TABS   Oral   Take 80 mg by mouth daily.         Marland Kitchen CALCIUM CARBONATE ANTACID 500 MG PO CHEW   Oral   Chew 1 tablet by mouth as needed. For  indigestion         . CETIRIZINE HCL 10 MG PO TABS   Oral   Take 10 mg by mouth daily as needed. For allergies         . ERGOCALCIFEROL 50000 UNITS PO CAPS   Oral   Take 1 capsule (50,000 Units total) by mouth once a week.   8 capsule   0   . FLUTICASONE PROPIONATE 50 MCG/ACT NA SUSP   Nasal   Place 2 sprays into the nose daily as needed. For congestion         . GUAIFENESIN-CODEINE 100-10 MG/5ML PO SYRP   Oral   Take 5 mLs by mouth 3 (three) times daily as needed for cough.   180 mL   0   . METOPROLOL TARTRATE 25 MG PO TABS   Oral   Take 1 tablet (25 mg total) by mouth 2 (two) times daily.   60 tablet   12   . MYCOPHENOLATE MOFETIL 250 MG PO CAPS   Oral   Take 250 mg by mouth 2 (two) times daily.          Marland Kitchen NAPROXEN 500 MG PO TABS   Oral   Take 1 tablet (500 mg total) by mouth 2 (two) times daily.   14 tablet   0   . OXYCODONE-ACETAMINOPHEN 5-500 MG PO  CAPS   Oral   Take 1 capsule by mouth every 4 (four) hours as needed. For pain         . PERMETHRIN 5 % EX CREA      Apply as directed from neck down and rinse in AM, repeat in 1 week if needed   60 g   1     Triage Vitals: BP 113/75  Pulse 101  Temp 98.2 F (36.8 C) (Oral)  Resp 18  Ht 5\' 6"  (1.676 m)  Wt 210 lb (95.255 kg)  BMI 33.89 kg/m2  SpO2 100%  Physical Exam  Nursing note and vitals reviewed. Constitutional: She is oriented to person, place, and time. She appears well-developed and well-nourished. No distress.  HENT:  Head: Normocephalic and atraumatic.  Eyes: EOM are normal. Pupils are equal, round, and reactive to light.  Neck: Normal range of motion. Neck supple. No tracheal deviation present.  Cardiovascular: Normal rate, regular rhythm and normal heart sounds.   Pulmonary/Chest: Effort normal and breath sounds normal. No respiratory distress.  Abdominal: Soft. She exhibits no distension. There is no rebound and no guarding.       Tender to palpation to RLQ and suprapubic region,   Musculoskeletal: Normal range of motion. She exhibits no edema.  Neurological: She is alert and oriented to person, place, and time.  Skin: Skin is warm and dry.  Psychiatric: She has a normal mood and affect. Her behavior is normal.    ED Course  Procedures (including critical care time)  DIAGNOSTIC STUDIES: Oxygen Saturation is 100% on room air, normal by my interpretation.    COORDINATION OF CARE:  8:42 PM: Discussed treatment plan which includes pain medication, blood work and a CT of the abdomen with pt at bedside and pt agreed to plan.   8:45 PM: Medication Orders- sodium chloride 0.9 % bolus 1,000 mL Once, morphine 4 MG/ML injection 4 mg Once    Labs Reviewed - No data to display No results found.   No diagnosis found.    MDM  The patient presents with lower abdominal pain but no abnormality with the  labs or ct scan.  She will be discharged with pain meds,  return prn.      I personally performed the services described in this documentation, which was scribed in my presence. The recorded information has been reviewed and is accurate.      Geoffery Lyons, MD 06/24/12 2330

## 2012-06-24 NOTE — ED Notes (Signed)
Pt presents with low right sided abd pain, also states low back pain, states these have been ongoing for 2 days. No other complaints. Dr Judd Lien in room assessing at this time. Abd is painful to palpation in the RLQ

## 2012-06-24 NOTE — ED Notes (Signed)
Hurting in lower right abdomen started two days ago, took a tylox and it stopped then, took one today and it didn't stop per pt. Hurting also in the center of my back per pt.

## 2012-06-30 ENCOUNTER — Encounter: Payer: Self-pay | Admitting: Family Medicine

## 2012-06-30 ENCOUNTER — Ambulatory Visit (INDEPENDENT_AMBULATORY_CARE_PROVIDER_SITE_OTHER): Payer: Medicare Other | Admitting: Family Medicine

## 2012-06-30 VITALS — BP 102/70 | HR 80 | Resp 18 | Ht 66.0 in | Wt 219.0 lb

## 2012-06-30 DIAGNOSIS — E785 Hyperlipidemia, unspecified: Secondary | ICD-10-CM

## 2012-06-30 DIAGNOSIS — E669 Obesity, unspecified: Secondary | ICD-10-CM

## 2012-06-30 DIAGNOSIS — E119 Type 2 diabetes mellitus without complications: Secondary | ICD-10-CM

## 2012-06-30 DIAGNOSIS — G4700 Insomnia, unspecified: Secondary | ICD-10-CM | POA: Insufficient documentation

## 2012-06-30 DIAGNOSIS — I10 Essential (primary) hypertension: Secondary | ICD-10-CM

## 2012-06-30 DIAGNOSIS — F17200 Nicotine dependence, unspecified, uncomplicated: Secondary | ICD-10-CM

## 2012-06-30 LAB — COMPLETE METABOLIC PANEL WITH GFR
ALT: 22 U/L (ref 0–35)
AST: 22 U/L (ref 0–37)
Calcium: 9.4 mg/dL (ref 8.4–10.5)
Chloride: 107 mEq/L (ref 96–112)
Creat: 0.69 mg/dL (ref 0.50–1.10)
Sodium: 139 mEq/L (ref 135–145)
Total Protein: 7.5 g/dL (ref 6.0–8.3)

## 2012-06-30 LAB — LIPID PANEL
HDL: 29 mg/dL — ABNORMAL LOW (ref >39)
LDL Cholesterol: 112 mg/dL — ABNORMAL HIGH (ref 0–99)
Triglycerides: 121 mg/dL (ref <150)
VLDL: 24 mg/dL (ref 0–40)

## 2012-06-30 MED ORDER — TEMAZEPAM 30 MG PO CAPS: 30.0000 mg | ORAL_CAPSULE | Freq: Every evening | ORAL | Status: DC | PRN

## 2012-06-30 NOTE — Patient Instructions (Addendum)
Annual wellness in 4 month  Lipid cmp and EGFr and hBa1C today.  You need to quit smoking  Med is sent in for sleep  Flu vaccine today   You will be referred for testing for circulation in your legs  Schedule your mammogram it is past due

## 2012-06-30 NOTE — Progress Notes (Signed)
  Subjective:    Patient ID: [name], female    DOB: 12/09/1965, 46 y.o.   MRN: [id number]  HPI Pt had acute low back pain radiating to left lower abdomen after missing a step when her left knee gave out, she was seen in the Ed for this and reports symptom improvement at this time. Continues to smoke, no commitment to quitting made at this time, states she is stressed out with poor relationships with her daughter, does not desire anti depressant, wants to continue xanx. Needs help with sleep. Continues to be followed at [facility] for lupus which is stable, also gets pain mecdication from that clinic for chronic knee pain   Review of Systems See HPI Denies recent fever or chills. Denies sinus pressure, nasal congestion, ear pain or sore throat. Denies chest congestion, productive cough or wheezing. Denies chest pains, palpitations and leg swelling Denies  nausea, vomiting,diarrhea or constipation.   Denies dysuria, frequency, hesitancy or incontinence. Denies joint pain, swelling and limitation in mobility.Denies headaches, seizures, numbness, or tingling. Denies depression, anxiety or insomnia. Denies skin break down or rash.        Objective:   Physical Exam Patient alert and oriented and in no cardiopulmonary distress.  HEENT: No facial asymmetry, EOMI, no sinus tenderness,  oropharynx pink and moist.  Neck supple no adenopathy.  Chest: Clear to auscultation bilaterally.Decreased throughout  CVS: S1, S2 no murmurs, no S3.Decreased DP's in both feet  ABD: Soft non tender. Bowel sounds normal.  Ext: No edema  MS: Adequate ROM spine, shoulders, hips and knees.  Skin: Intact, no ulcerations or rash noted.  Psych: Good eye contact, normal affect. Memory intact not anxious or depressed appearing.  CNS: CN 2-12 intact, power, tone and sensation normal throughout.        Assessment & Plan:

## 2012-07-01 MED ORDER — ROSUVASTATIN CALCIUM 40 MG PO TABS: 40.0000 mg | ORAL_TABLET | Freq: Every day | ORAL | Status: DC

## 2012-07-04 NOTE — Assessment & Plan Note (Signed)
Deteriorated. Patient re-educated about  the importance of commitment to a  minimum of 150 minutes of exercise per week. The importance of healthy food choices with portion control discussed. Encouraged to start a food diary, count calories and to consider  joining a support group. Sample diet sheets offered. Goals set by the patient for the next several months.    

## 2012-07-04 NOTE — Assessment & Plan Note (Signed)
Controlled, no change in medication DASH diet and commitment to daily physical activity for a minimum of 30 minutes discussed and encouraged, as a part of hypertension management. The importance of attaining a healthy weight is also discussed.  

## 2012-07-04 NOTE — Assessment & Plan Note (Signed)
Uncontrolled, Hyperlipidemia:Low fat diet discussed and encouraged.

## 2012-07-04 NOTE — Assessment & Plan Note (Signed)
Diet controlled , continue same .  

## 2012-07-04 NOTE — Assessment & Plan Note (Signed)
Unchanged. Patient counseled for approximately 5 minutes regarding the health risks of ongoing nicotine use, specifically all types of cancer, heart disease, stroke and respiratory failure. The options available for help with cessation ,the behavioral changes to assist the process, and the option to either gradully reduce usage  Or abruptly stop.is also discussed. Pt is also encouraged to set specific goals in number of cigarettes used daily, as well as to set a quit date.  

## 2012-07-04 NOTE — Assessment & Plan Note (Signed)
Sleep hygiene disussed, med sent in

## 2012-07-07 ENCOUNTER — Other Ambulatory Visit: Payer: Self-pay

## 2012-07-07 ENCOUNTER — Other Ambulatory Visit: Payer: Self-pay | Admitting: Family Medicine

## 2012-07-07 ENCOUNTER — Telehealth: Payer: Self-pay

## 2012-07-07 DIAGNOSIS — E785 Hyperlipidemia, unspecified: Secondary | ICD-10-CM

## 2012-07-07 DIAGNOSIS — Z1231 Encounter for screening mammogram for malignant neoplasm of breast: Secondary | ICD-10-CM

## 2012-07-07 MED ORDER — ROSUVASTATIN CALCIUM 40 MG PO TABS: 40.0000 mg | ORAL_TABLET | Freq: Every day | ORAL | Status: DC

## 2012-07-07 MED ORDER — FLUCONAZOLE 150 MG PO TABS: 150.0000 mg | ORAL_TABLET | Freq: Once | ORAL | Status: DC

## 2012-07-07 MED ORDER — CEPHALEXIN 500 MG PO CAPS: 500.0000 mg | ORAL_CAPSULE | Freq: Three times a day (TID) | ORAL | Status: AC

## 2012-07-07 NOTE — Telephone Encounter (Signed)
pls send keflex 500mg  one 3 times daily #30 and fluconazole 150mg  #2 tabs , one daily as needed for vag itch and let her know needs to get tooth taken care of, I understand her situation, will not refill the antibiotic course since tooth needs to be dealthwith

## 2012-07-07 NOTE — Telephone Encounter (Signed)
Patient aware and meds sent 

## 2012-07-14 ENCOUNTER — Encounter: Payer: Medicare Other | Admitting: Internal Medicine

## 2012-07-27 ENCOUNTER — Telehealth: Payer: Self-pay | Admitting: Family Medicine

## 2012-07-27 NOTE — Telephone Encounter (Signed)
I have not referred her to Dr Despina Hidden since 06/2011, so I am unclear as to why this is a current cause of concern please let me know

## 2012-07-28 ENCOUNTER — Telehealth: Payer: Self-pay | Admitting: Family Medicine

## 2012-07-28 ENCOUNTER — Other Ambulatory Visit: Payer: Self-pay | Admitting: Family Medicine

## 2012-07-28 DIAGNOSIS — R109 Unspecified abdominal pain: Secondary | ICD-10-CM

## 2012-07-28 NOTE — Telephone Encounter (Signed)
Pt to be referred to Dr Darrick Penna for eval of abdominal pain and referral entered also for ABI testing to eval for pAD, I will write that order and ask you to fax and schedule I spoke with her. She expects a call tomorrow. Thanks

## 2012-07-29 ENCOUNTER — Other Ambulatory Visit: Payer: Self-pay | Admitting: Family Medicine

## 2012-07-29 ENCOUNTER — Telehealth: Payer: Self-pay | Admitting: Family Medicine

## 2012-07-29 DIAGNOSIS — I739 Peripheral vascular disease, unspecified: Secondary | ICD-10-CM

## 2012-07-29 NOTE — Telephone Encounter (Signed)
Patient is aware 

## 2012-08-03 ENCOUNTER — Encounter: Payer: Self-pay | Admitting: Gastroenterology

## 2012-08-03 ENCOUNTER — Ambulatory Visit (HOSPITAL_COMMUNITY): Admission: RE | Admit: 2012-08-03 | Payer: Medicare Other | Source: Ambulatory Visit

## 2012-08-04 ENCOUNTER — Ambulatory Visit (INDEPENDENT_AMBULATORY_CARE_PROVIDER_SITE_OTHER): Payer: Medicare Other | Admitting: Gastroenterology

## 2012-08-04 ENCOUNTER — Encounter: Payer: Self-pay | Admitting: Gastroenterology

## 2012-08-04 VITALS — BP 116/72 | HR 83 | Temp 97.6°F | Ht 66.0 in | Wt 218.0 lb

## 2012-08-04 DIAGNOSIS — R109 Unspecified abdominal pain: Secondary | ICD-10-CM

## 2012-08-04 DIAGNOSIS — K7689 Other specified diseases of liver: Secondary | ICD-10-CM

## 2012-08-04 DIAGNOSIS — K760 Fatty (change of) liver, not elsewhere classified: Secondary | ICD-10-CM

## 2012-08-04 MED ORDER — HYOSCYAMINE SULFATE 0.125 MG SL SUBL: 0.1250 mg | SUBLINGUAL_TABLET | SUBLINGUAL | Status: DC | PRN

## 2012-08-04 NOTE — Progress Notes (Signed)
Referring Provider: Kerri Perches, MD Primary Care Physician:  Syliva Overman, MD Primary GI: Dr. Darrick Penna   Chief Complaint  Patient presents with  . Abdominal Pain    HPI:   46 year old female returning today at request of Dr. Lodema Hong secondary to abdominal pain. Last TCS in Nov 2012 as noted below; needs surveillance in Nov 2017. Hx of GERD and constipation. Notes upper and lower abdominal discomfort ONLY WHEN URINATING. Resolves after urination. States lower abdominal discomfort "right where ovaries are supposed to be". Notes worse first thing in am, intermittent throughout the day as well. Only hits her when has to pee. Not associated with eating. BM every other day. Denies constipation. Denies bloating. Better after urinating. Denies N/V. No diarrhea. No loss of appetite. No rectal bleeding. Symptoms present X 2 months.   GERD controlled, no PPI.    Nov 2013 CT abd/pelvisIMPRESSION:  1. No acute abnormalities involving the abdomen or pelvis.  2. Severe diffuse hepatic steatosis without focal hepatic  parenchymal abnormality.  3. Moderate to severe aorto-iliac atherosclerosis which is quite  advanced for age.  4. Cardiomegaly with left ventricular predominance.  Lab Results  Component Value Date   ALT 22 06/30/2012   AST 22 06/30/2012   ALKPHOS 93 06/30/2012   BILITOT 0.7 06/30/2012     Past Medical History  Diagnosis Date  . coronary artery disease     RCA stenting Myoview 2011 EF 30% infarction dilatation without ischemia  . CHF (congestive heart failure), NYHA class III   . Automatic implantable cardiac defibrillator in situ     Medtronic  . Other and unspecified hyperlipidemia   . Cardiomyopathy secondary     Patient has cardiomyopathy out of proportion to her ischemic heart disease  . Depression   . GERD (gastroesophageal reflux disease)   . Nicotine abuse   . Insomnia   . Migraines   . Lupus (systemic lupus erythematosus)   . Arthritis of knee   .  Myocardial infarction     8 total   . Coronary artery disease   . Diabetes mellitus     Past Surgical History  Procedure Date  . Cholecystectomy   . Abdominal hysterectomy   . Rotator cuff repair 2002  . Defibrillator placed  2009  . Colonoscopy 07/15/2011    SLF: internal hemorrhoids/hyperplastic polyps in the rectum/tubular adenomaSURVEILLANCE Nov 2017    Current Outpatient Prescriptions  Medication Sig Dispense Refill  . Aspirin-Acetaminophen (GOODYS BODY PAIN PO) Take 1 Package by mouth daily as needed. For pain      . fluticasone (FLONASE) 50 MCG/ACT nasal spray Place 2 sprays into the nose daily as needed. For congestion      . oxyCODONE-acetaminophen (TYLOX) 5-500 MG per capsule Take 1 capsule by mouth every 6 (six) hours as needed.       . rosuvastatin (CRESTOR) 40 MG tablet Take 1 tablet (40 mg total) by mouth daily.  30 tablet  5  . ergocalciferol (VITAMIN D2) 50000 UNITS capsule Take 1 capsule (50,000 Units total) by mouth once a week.  8 capsule  0  . fluconazole (DIFLUCAN) 150 MG tablet Take 1 tablet (150 mg total) by mouth once.  2 tablet  0  . metoprolol tartrate (LOPRESSOR) 25 MG tablet Take 1 tablet (25 mg total) by mouth 2 (two) times daily.  60 tablet  12  . temazepam (RESTORIL) 30 MG capsule Take 1 capsule (30 mg total) by mouth at bedtime as needed for sleep.  30  capsule  3    Allergies as of 08/04/2012 - Review Complete 08/04/2012  Allergen Reaction Noted  . Bee venom Anaphylaxis 04/09/2011  . Cortisone Swelling 05/27/2011    Family History  Problem Relation Age of Onset  . Hepatitis Mother 60    HCV  . Liver cancer Mother 58    History   Social History  . Marital Status: Single    Spouse Name: N/A    Number of Children: 3  . Years of Education: N/A   Occupational History  . disabled    Social History Main Topics  . Smoking status: Current Every Day Smoker -- 0.5 packs/day for 7 years    Types: Cigarettes  . Smokeless tobacco: None  .  Alcohol Use: Yes     Comment: occasionaly  . Drug Use: No  . Sexually Active: Yes    Birth Control/ Protection: Surgical   Other Topics Concern  . None   Social History Narrative  . None    Review of Systems: Negative unless otherwise mentioned in HPI.   Physical Exam: BP 116/72  Pulse 83  Temp 97.6 F (36.4 C) (Oral)  Ht 5\' 6"  (1.676 m)  Wt 218 lb (98.884 kg)  BMI 35.19 kg/m2 General:   Alert and oriented. No distress noted. Pleasant and cooperative.  Head:  Normocephalic and atraumatic. Eyes:  Conjuctiva clear without scleral icterus. Mouth:  Oral mucosa pink and moist. Good dentition. No lesions. Heart:  S1, S2 present without murmurs, rubs, or gallops. Regular rate and rhythm. Abdomen:  +BS, soft, mild TTP lower abdomen and non-distended. No rebound or guarding. No HSM or masses noted. Msk:  Symmetrical without gross deformities. Normal posture. Extremities:  Without edema. Neurologic:  Alert and  oriented x4;  grossly normal neurologically. Skin:  Intact without significant lesions or rashes. Psych:  Alert and cooperative. Normal mood and affect.

## 2012-08-04 NOTE — Patient Instructions (Addendum)
I have sent in a prescription for medication that you can take every 4 hours for the abdominal pain. I will be talking to Dr. Darrick Penna and reviewing the CT scan with the radiologist.  In the meantime, please have the urinalysis completed. We will call you with results,

## 2012-08-10 DIAGNOSIS — K760 Fatty (change of) liver, not elsewhere classified: Secondary | ICD-10-CM | POA: Insufficient documentation

## 2012-08-10 DIAGNOSIS — R1030 Lower abdominal pain, unspecified: Secondary | ICD-10-CM | POA: Insufficient documentation

## 2012-08-10 LAB — URINALYSIS W MICROSCOPIC + REFLEX CULTURE
Leukocytes, UA: NEGATIVE
Nitrite: NEGATIVE
Protein, ur: NEGATIVE mg/dL
Squamous Epithelial / LPF: NONE SEEN
Urobilinogen, UA: 0.2 mg/dL (ref 0.0–1.0)

## 2012-08-10 NOTE — Assessment & Plan Note (Addendum)
46 year old female with several month hx of upper/lower abdominal pain only with urinating. Relieves thereafter. No changes in bowel habits, rectal bleeding, N/V. CT as above. I reviewed with Dr. Margo Aye, radiologist. No evidence of celiac or SMA atherosclerosis. +atherosclerosis at IMA although not likely contributor. Symptoms somewhat concerning for interstitial cystitis; it is unclear exactly what etiology of pain is. Will obtain a UA with culture, start Levbid prn, await UA results for further recommendations.   3 mos f/u.

## 2012-08-10 NOTE — Assessment & Plan Note (Signed)
Normal LFTs Nov 2013. Recheck in 1 year.

## 2012-08-10 NOTE — Progress Notes (Signed)
Quick Note:  Negative UA. I reviewed results with pt. Please refer pt to urology as soon as possible Not clear what is going on, but she only has discomfort with voiding, and it is immediately relieved thereafter.    ______

## 2012-08-12 ENCOUNTER — Ambulatory Visit: Payer: Medicare Other

## 2012-08-13 ENCOUNTER — Telehealth: Payer: Self-pay | Admitting: Gastroenterology

## 2012-08-13 NOTE — Progress Notes (Signed)
Referral has been faxed to [facility] and they will let me know when appt has been made with the patient

## 2012-08-13 NOTE — Telephone Encounter (Signed)
Patient is scheduled with Dr. Annabell Howells at [facility] on 09/17/12 at 10:00 and patient is aware

## 2012-08-16 ENCOUNTER — Encounter: Payer: Self-pay | Admitting: Gastroenterology

## 2012-08-16 NOTE — Progress Notes (Signed)
Faxed to PCP

## 2012-08-16 NOTE — Progress Notes (Signed)
Pt is aware of OV on 10/20/12 at 0900 with SF and appt card was mailed

## 2012-08-19 NOTE — Telephone Encounter (Signed)
Wonderful. 

## 2012-09-07 ENCOUNTER — Ambulatory Visit: Payer: Medicare Other

## 2012-09-13 ENCOUNTER — Emergency Department (HOSPITAL_COMMUNITY)
Admission: EM | Admit: 2012-09-13 | Discharge: 2012-09-14 | Disposition: A | Discharge status: Home / Self Care | Payer: PRIVATE HEALTH INSURANCE | Attending: Emergency Medicine | Admitting: Emergency Medicine

## 2012-09-13 ENCOUNTER — Encounter (HOSPITAL_COMMUNITY): Payer: Self-pay | Admitting: *Deleted

## 2012-09-13 DIAGNOSIS — Z8679 Personal history of other diseases of the circulatory system: Secondary | ICD-10-CM | POA: Insufficient documentation

## 2012-09-13 DIAGNOSIS — R110 Nausea: Secondary | ICD-10-CM | POA: Insufficient documentation

## 2012-09-13 DIAGNOSIS — Z9089 Acquired absence of other organs: Secondary | ICD-10-CM | POA: Insufficient documentation

## 2012-09-13 DIAGNOSIS — E119 Type 2 diabetes mellitus without complications: Secondary | ICD-10-CM | POA: Insufficient documentation

## 2012-09-13 DIAGNOSIS — R3915 Urgency of urination: Secondary | ICD-10-CM | POA: Insufficient documentation

## 2012-09-13 DIAGNOSIS — I252 Old myocardial infarction: Secondary | ICD-10-CM | POA: Insufficient documentation

## 2012-09-13 DIAGNOSIS — A599 Trichomoniasis, unspecified: Secondary | ICD-10-CM

## 2012-09-13 DIAGNOSIS — G4700 Insomnia, unspecified: Secondary | ICD-10-CM | POA: Insufficient documentation

## 2012-09-13 DIAGNOSIS — Z8659 Personal history of other mental and behavioral disorders: Secondary | ICD-10-CM | POA: Insufficient documentation

## 2012-09-13 DIAGNOSIS — Z90710 Acquired absence of both cervix and uterus: Secondary | ICD-10-CM | POA: Insufficient documentation

## 2012-09-13 DIAGNOSIS — I2510 Atherosclerotic heart disease of native coronary artery without angina pectoris: Secondary | ICD-10-CM | POA: Insufficient documentation

## 2012-09-13 DIAGNOSIS — R350 Frequency of micturition: Secondary | ICD-10-CM | POA: Insufficient documentation

## 2012-09-13 DIAGNOSIS — Z95810 Presence of automatic (implantable) cardiac defibrillator: Secondary | ICD-10-CM | POA: Insufficient documentation

## 2012-09-13 DIAGNOSIS — Z8739 Personal history of other diseases of the musculoskeletal system and connective tissue: Secondary | ICD-10-CM | POA: Insufficient documentation

## 2012-09-13 DIAGNOSIS — Z8719 Personal history of other diseases of the digestive system: Secondary | ICD-10-CM | POA: Insufficient documentation

## 2012-09-13 DIAGNOSIS — E785 Hyperlipidemia, unspecified: Secondary | ICD-10-CM | POA: Insufficient documentation

## 2012-09-13 DIAGNOSIS — F17200 Nicotine dependence, unspecified, uncomplicated: Secondary | ICD-10-CM | POA: Insufficient documentation

## 2012-09-13 DIAGNOSIS — Z7982 Long term (current) use of aspirin: Secondary | ICD-10-CM | POA: Insufficient documentation

## 2012-09-13 DIAGNOSIS — Z79899 Other long term (current) drug therapy: Secondary | ICD-10-CM | POA: Insufficient documentation

## 2012-09-13 DIAGNOSIS — M329 Systemic lupus erythematosus, unspecified: Secondary | ICD-10-CM | POA: Insufficient documentation

## 2012-09-13 DIAGNOSIS — N390 Urinary tract infection, site not specified: Secondary | ICD-10-CM

## 2012-09-13 DIAGNOSIS — R300 Dysuria: Secondary | ICD-10-CM | POA: Insufficient documentation

## 2012-09-13 DIAGNOSIS — I509 Heart failure, unspecified: Secondary | ICD-10-CM | POA: Insufficient documentation

## 2012-09-13 LAB — URINALYSIS, ROUTINE W REFLEX MICROSCOPIC
Bilirubin Urine: NEGATIVE
Specific Gravity, Urine: 1.025 (ref 1.005–1.030)
pH: 6 (ref 5.0–8.0)

## 2012-09-13 LAB — URINE MICROSCOPIC-ADD ON

## 2012-09-13 MED ORDER — NITROFURANTOIN MACROCRYSTAL 100 MG PO CAPS
100.0000 mg | ORAL_CAPSULE | Freq: Once | ORAL | Status: AC
  Administered 2012-09-13: 100 mg via ORAL
  Filled 2012-09-13: qty 1

## 2012-09-13 MED ORDER — ONDANSETRON 4 MG PO TBDP
4.0000 mg | ORAL_TABLET | Freq: Once | ORAL | Status: AC
  Administered 2012-09-13: 4 mg via ORAL
  Filled 2012-09-13: qty 1

## 2012-09-13 NOTE — ED Notes (Signed)
Pt reporting pain in left flank area and lower abdomen.  Report mild burning with urination, also reporting frequency and urgency.

## 2012-09-13 NOTE — ED Provider Notes (Signed)
History  This chart was scribed for [employer]. Colon Branch, MD by Erskine Emery, ED Scribe. This patient was seen in room APA09/APA09 and the patient's care was started at 23:02.   CSN: [id number]  Arrival date & time 09/13/12  2207   First MD Initiated Contact with Patient 09/13/12 2302      Chief Complaint  Patient presents with  . Flank Pain  . Abdominal Pain    (Consider location/radiation/quality/duration/timing/severity/associated sxs/prior treatment) The history is provided by the patient. No language interpreter was used.  [name] is a 47 y.o. female who presents to the Emergency Department complaining of left flank pain, lower abdominal pain, mild dysuria, urinary frequency, and urinary urgency. Pt reports some associated nausea earlier but she denies any hematuria, emesis, or current nausea.  Past Medical History  Diagnosis Date  . coronary artery disease     RCA stenting Myoview 2011 EF 30% infarction dilatation without ischemia  . CHF (congestive heart failure), NYHA class III   . Automatic implantable cardiac defibrillator in situ     Medtronic  . Other and unspecified hyperlipidemia   . Cardiomyopathy secondary     Patient has cardiomyopathy out of proportion to her ischemic heart disease  . Depression   . GERD (gastroesophageal reflux disease)   . Nicotine abuse   . Insomnia   . Migraines   . Lupus (systemic lupus erythematosus)   . Arthritis of knee   . Myocardial infarction     8 total   . Coronary artery disease   . Diabetes mellitus     Past Surgical History  Procedure Date  . Cholecystectomy   . Abdominal hysterectomy   . Rotator cuff repair 2002  . Defibrillator placed  2009  . Colonoscopy 07/15/2011    SLF: internal hemorrhoids/hyperplastic polyps in the rectum/tubular adenomaSURVEILLANCE Nov 2017    Family History  Problem Relation Age of Onset  . Hepatitis Mother 60    HCV  . Liver cancer Mother 73    History  Substance Use Topics    . Smoking status: Current Every Day Smoker -- 0.5 packs/day for 7 years    Types: Cigarettes  . Smokeless tobacco: Not on file  . Alcohol Use: Yes     Comment: occasionaly    OB History    Grav Para Term Preterm Abortions TAB SAB Ect Mult Living                  Review of Systems  Constitutional: Negative for fever.       10 Systems reviewed and are negative for acute change except as noted in the HPI.  HENT: Negative for congestion.   Eyes: Negative for discharge and redness.  Respiratory: Negative for cough and shortness of breath.   Cardiovascular: Negative for chest pain.  Gastrointestinal: Negative for vomiting and abdominal pain.  Musculoskeletal: Negative for back pain.  Skin: Negative for rash.  Neurological: Negative for syncope, numbness and headaches.  Psychiatric/Behavioral:       No behavior change.   A complete 10 system review of systems was obtained and all systems are negative except as noted in the HPI and PMH.    Allergies  Bee venom and Cortisone  Home Medications   Current Outpatient Rx  Name  Route  Sig  Dispense  Refill  . GOODYS BODY PAIN PO   Oral   Take 1 Package by mouth daily as needed. For pain         .  METOPROLOL TARTRATE 25 MG PO TABS   Oral   Take 1 tablet (25 mg total) by mouth 2 (two) times daily.   60 tablet   12   . OXYCODONE-ACETAMINOPHEN 5-500 MG PO CAPS   Oral   Take 1 capsule by mouth every 6 (six) hours as needed. For pain         . ROSUVASTATIN CALCIUM 40 MG PO TABS   Oral   Take 1 tablet (40 mg total) by mouth daily.   30 tablet   5     Discontinue lipitor 80mg  , not effective .   Marland Kitchen TEMAZEPAM 30 MG PO CAPS   Oral   Take 1 capsule (30 mg total) by mouth at bedtime as needed for sleep.   30 capsule   3     Triage Vitals: BP 122/78  Pulse 86  Temp 98 F (36.7 C) (Oral)  Resp 20  Ht 5\' 6"  (1.676 m)  Wt 212 lb (96.163 kg)  BMI 34.22 kg/m2  SpO2 98%  Physical Exam  Nursing note and vitals  reviewed. Constitutional: She is oriented to person, place, and time. She appears well-developed and well-nourished. No distress.  HENT:  Head: Normocephalic and atraumatic.  Eyes: EOM are normal. Pupils are equal, round, and reactive to light.  Neck: Neck supple. No tracheal deviation present.  Cardiovascular: Normal rate, regular rhythm and normal heart sounds.   Pulmonary/Chest: Effort normal. No respiratory distress.  Abdominal: Soft. She exhibits no distension. There is no tenderness.  Musculoskeletal: Normal range of motion. She exhibits no edema.  Neurological: She is alert and oriented to person, place, and time.  Skin: Skin is warm and dry.  Psychiatric: She has a normal mood and affect.    ED Course  Procedures (including critical care time) DIAGNOSTIC STUDIES: Oxygen Saturation is 98% on room air, normal by my interpretation.    COORDINATION OF CARE: 23:16--I evaluated the patient and we discussed a treatment plan including antibiotics and nausea medication to which the pt agreed. I explained to the pt that her labs show she has an infection. Results for orders placed during the hospital encounter of 09/13/12  URINALYSIS, ROUTINE W REFLEX MICROSCOPIC      Component Value Range   Color, Urine YELLOW  YELLOW   APPearance CLOUDY (*) CLEAR   Specific Gravity, Urine 1.025  1.005 - 1.030   pH 6.0  5.0 - 8.0   Glucose, UA NEGATIVE  NEGATIVE mg/dL   Hgb urine dipstick TRACE (*) NEGATIVE   Bilirubin Urine NEGATIVE  NEGATIVE   Ketones, ur NEGATIVE  NEGATIVE mg/dL   Protein, ur NEGATIVE  NEGATIVE mg/dL   Urobilinogen, UA 0.2  0.0 - 1.0 mg/dL   Nitrite NEGATIVE  NEGATIVE   Leukocytes, UA SMALL (*) NEGATIVE  URINE MICROSCOPIC-ADD ON      Component Value Range   WBC, UA 21-50  <3 WBC/hpf   RBC / HPF 0-2  <3 RBC/hpf   Bacteria, UA MANY (*) RARE   Urine-Other TRICHOMONAS PRESENT         MDM  Patient with right flank pain and right abdominal pain. Unable to reproduce  either. UA with evidence of UTI and trichomonas. Given antibiotics and trich coverage. Reviewed results with patient . Pt stable in ED with no significant deterioration in condition.The patient appears reasonably screened and/or stabilized for discharge and I doubt any other medical condition or other [facility] requiring further screening, evaluation, or treatment in the ED at this time  prior to discharge.  I personally performed the services described in this documentation, which was scribed in my presence. The recorded information has been reviewed and considered.  MDM Reviewed: nursing note and vitals Interpretation: labs           Nicoletta Dress. Colon Branch, MD 09/13/12 2359

## 2012-09-13 NOTE — ED Notes (Signed)
Patient states around 1:30pm when pain was so bad she took one of her "Tylox that I have for my lupus"   Fell asleep after pain medication  - awoke shortly before arrival with same pain - states it is worse.

## 2012-09-14 LAB — URINE CULTURE

## 2012-09-14 MED ORDER — ONDANSETRON HCL 4 MG PO TABS: 4.0000 mg | ORAL_TABLET | Freq: Four times a day (QID) | ORAL | Status: DC

## 2012-09-14 MED ORDER — METRONIDAZOLE 375 MG PO CAPS: 375.0000 mg | ORAL_CAPSULE | Freq: Two times a day (BID) | ORAL | Status: DC

## 2012-09-14 MED ORDER — NITROFURANTOIN MACROCRYSTAL 100 MG PO CAPS: 100.0000 mg | ORAL_CAPSULE | Freq: Two times a day (BID) | ORAL | Status: DC

## 2012-09-14 MED ORDER — METRONIDAZOLE 500 MG PO TABS
500.0000 mg | ORAL_TABLET | Freq: Once | ORAL | Status: AC
  Administered 2012-09-14: 500 mg via ORAL
  Filled 2012-09-14: qty 1

## 2012-09-17 ENCOUNTER — Ambulatory Visit (INDEPENDENT_AMBULATORY_CARE_PROVIDER_SITE_OTHER): Payer: Medicare Other | Admitting: Urology

## 2012-09-17 DIAGNOSIS — R109 Unspecified abdominal pain: Secondary | ICD-10-CM

## 2012-09-17 DIAGNOSIS — A5903 Trichomonal cystitis and urethritis: Secondary | ICD-10-CM

## 2012-09-17 DIAGNOSIS — R3915 Urgency of urination: Secondary | ICD-10-CM

## 2012-10-05 ENCOUNTER — Ambulatory Visit
Admission: RE | Admit: 2012-10-05 | Discharge: 2012-10-05 | Disposition: A | Discharge status: Home / Self Care | Payer: PRIVATE HEALTH INSURANCE | Source: Ambulatory Visit | Attending: Family Medicine | Admitting: Family Medicine

## 2012-10-05 DIAGNOSIS — Z1231 Encounter for screening mammogram for malignant neoplasm of breast: Secondary | ICD-10-CM

## 2012-10-06 ENCOUNTER — Other Ambulatory Visit: Payer: Self-pay | Admitting: Family Medicine

## 2012-10-06 DIAGNOSIS — R928 Other abnormal and inconclusive findings on diagnostic imaging of breast: Secondary | ICD-10-CM

## 2012-10-15 ENCOUNTER — Ambulatory Visit
Admission: RE | Admit: 2012-10-15 | Discharge: 2012-10-15 | Disposition: A | Discharge status: Home / Self Care | Payer: Medicare Other | Source: Ambulatory Visit | Attending: Family Medicine | Admitting: Family Medicine

## 2012-10-15 DIAGNOSIS — R928 Other abnormal and inconclusive findings on diagnostic imaging of breast: Secondary | ICD-10-CM

## 2012-10-20 ENCOUNTER — Ambulatory Visit: Payer: Medicare Other | Admitting: Gastroenterology

## 2012-10-22 NOTE — Progress Notes (Signed)
REVIEWED.  

## 2012-10-28 ENCOUNTER — Ambulatory Visit: Payer: Medicare Other | Admitting: Family Medicine

## 2012-11-02 ENCOUNTER — Encounter: Payer: Self-pay | Admitting: Family Medicine

## 2012-11-02 ENCOUNTER — Other Ambulatory Visit (HOSPITAL_COMMUNITY)
Admission: RE | Admit: 2012-11-02 | Discharge: 2012-11-02 | Disposition: A | Discharge status: Home / Self Care | Payer: PRIVATE HEALTH INSURANCE | Source: Ambulatory Visit | Attending: Family Medicine | Admitting: Family Medicine

## 2012-11-02 ENCOUNTER — Ambulatory Visit (INDEPENDENT_AMBULATORY_CARE_PROVIDER_SITE_OTHER): Payer: Medicare Other | Admitting: Family Medicine

## 2012-11-02 VITALS — BP 112/74 | HR 87 | Resp 18 | Ht 66.0 in | Wt 221.1 lb

## 2012-11-02 DIAGNOSIS — F17200 Nicotine dependence, unspecified, uncomplicated: Secondary | ICD-10-CM

## 2012-11-02 DIAGNOSIS — Z01419 Encounter for gynecological examination (general) (routine) without abnormal findings: Secondary | ICD-10-CM | POA: Insufficient documentation

## 2012-11-02 DIAGNOSIS — Z1212 Encounter for screening for malignant neoplasm of rectum: Secondary | ICD-10-CM

## 2012-11-02 DIAGNOSIS — Z87898 Personal history of other specified conditions: Secondary | ICD-10-CM

## 2012-11-02 DIAGNOSIS — M329 Systemic lupus erythematosus, unspecified: Secondary | ICD-10-CM

## 2012-11-02 DIAGNOSIS — Z8742 Personal history of other diseases of the female genital tract: Secondary | ICD-10-CM

## 2012-11-02 DIAGNOSIS — Z1211 Encounter for screening for malignant neoplasm of colon: Secondary | ICD-10-CM

## 2012-11-02 DIAGNOSIS — N760 Acute vaginitis: Secondary | ICD-10-CM

## 2012-11-02 DIAGNOSIS — E119 Type 2 diabetes mellitus without complications: Secondary | ICD-10-CM

## 2012-11-02 DIAGNOSIS — E785 Hyperlipidemia, unspecified: Secondary | ICD-10-CM

## 2012-11-02 DIAGNOSIS — E669 Obesity, unspecified: Secondary | ICD-10-CM

## 2012-11-02 DIAGNOSIS — Z113 Encounter for screening for infections with a predominantly sexual mode of transmission: Secondary | ICD-10-CM | POA: Insufficient documentation

## 2012-11-02 LAB — HEMOCCULT GUIAC POC 1CARD (OFFICE): Fecal Occult Blood, POC: NEGATIVE

## 2012-11-02 NOTE — Patient Instructions (Addendum)
Annual wellness  in 4 month  You need to stop smoking , this is increasing the risk of a recurrent heart attack and all types of cancer.  I am sorry to hear of your recent loss  Specimens for testing are being sent today for testing , you also need fasting labs asap, lipid, cmp and EGFr, hBa1C, RPR and HIV  Please cut back on amount of food you eat so that you lose weight

## 2012-11-02 NOTE — Assessment & Plan Note (Signed)
Hyperlipidemia:Low fat diet discussed and encouraged.  Updated lab asap 

## 2012-11-02 NOTE — Assessment & Plan Note (Signed)
Deteriorated Patient counseled for approximately 5 minutes regarding the health risks of ongoing nicotine use, specifically all types of cancer, heart disease, stroke and respiratory failure. The options available for help with cessation ,the behavioral changes to assist the process, and the option to either gradully reduce usage  Or abruptly stop.is also discussed. Pt is also encouraged to set specific goals in number of cigarettes used daily, as well as to set a quit date.    

## 2012-11-02 NOTE — Assessment & Plan Note (Signed)
Deteriorated. Patient re-educated about  the importance of commitment to a  minimum of 150 minutes of exercise per week. The importance of healthy food choices with portion control discussed. Encouraged to start a food diary, count calories and to consider  joining a support group. Sample diet sheets offered. Goals set by the patient for the next several months.    

## 2012-11-04 ENCOUNTER — Other Ambulatory Visit: Payer: Self-pay | Admitting: Family Medicine

## 2012-11-05 ENCOUNTER — Other Ambulatory Visit: Payer: Self-pay

## 2012-11-05 ENCOUNTER — Encounter: Payer: Self-pay | Admitting: Family Medicine

## 2012-11-05 ENCOUNTER — Other Ambulatory Visit: Payer: Self-pay | Admitting: Family Medicine

## 2012-11-05 DIAGNOSIS — R768 Other specified abnormal immunological findings in serum: Secondary | ICD-10-CM | POA: Insufficient documentation

## 2012-11-05 LAB — LIPID PANEL
HDL: 31 mg/dL — ABNORMAL LOW (ref >39)
LDL Cholesterol: 117 mg/dL — ABNORMAL HIGH (ref 0–99)
Total CHOL/HDL Ratio: 5.4 Ratio

## 2012-11-05 LAB — RPR

## 2012-11-05 LAB — COMPLETE METABOLIC PANEL WITH GFR
ALT: 22 U/L (ref 0–35)
Alkaline Phosphatase: 103 U/L (ref 39–117)
GFR, Est Non African American: >89 mL/min
Sodium: 142 mEq/L (ref 135–145)
Total Bilirubin: 0.7 mg/dL (ref 0.3–1.2)
Total Protein: 7.7 g/dL (ref 6.0–8.3)

## 2012-11-05 LAB — HIV ANTIBODY (ROUTINE TESTING W REFLEX): HIV: NONREACTIVE

## 2012-11-05 LAB — HEMOGLOBIN A1C: Mean Plasma Glucose: 134 mg/dL — ABNORMAL HIGH (ref <117)

## 2012-11-05 MED ORDER — METFORMIN HCL ER 500 MG PO TB24: 500.0000 mg | ORAL_TABLET | Freq: Every day | ORAL | Status: DC

## 2012-11-07 DIAGNOSIS — F17200 Nicotine dependence, unspecified, uncomplicated: Secondary | ICD-10-CM | POA: Insufficient documentation

## 2012-11-07 NOTE — Assessment & Plan Note (Signed)
Followed ant [facility], clinically stable, sees rheumatology, dermatology and nephrology at [facility]

## 2012-11-07 NOTE — Assessment & Plan Note (Signed)
Pap sent at visit

## 2012-11-07 NOTE — Progress Notes (Signed)
  Subjective:    Patient ID: [name], female    DOB: 03-04-1966, 47 y.o.   MRN: [id number]  HPI The PT is here for follow up and re-evaluation of chronic medical conditions, medication management and review of any available recent lab and radiology data.  Preventive health is updated, specifically  Cancer screening and Immunization.   The PT denies any adverse reactions to current medications since the last visit.  Recently treated for trichomonas after being in a monogamous relationship for over 2 years. She is extremely disappointed and angry states she is 'done with relationships "wants to be re tested since infection was picked up recently Smoking daily, reports for her nerves,no interest in setting quit date currently, understands why sh should quit    Review of Systems See HPI Denies recent fever or chills. Denies sinus pressure, nasal congestion, ear pain or sore throat. Denies chest congestion, productive cough or wheezing. Denies chest pains, palpitations and leg swelling Denies abdominal pain, nausea, vomiting,diarrhea or constipation.   Denies dysuria, frequency, hesitancy or incontinence. Chronic knee pain recieves narcotic meds for [facility], operation is last resort. Denies headaches, seizures, numbness, or tingling. . Denies skin break down or rash.        Objective:   Physical Exam Patient alert and oriented and in no cardiopulmonary distress.  HEENT: No facial asymmetry, EOMI, no sinus tenderness,  oropharynx pink and moist.  Neck supple no adenopathy.  Chest: Clear to auscultation bilaterally.Decreased air entry throughout  CVS: S1, S2 no murmurs, no S3.  ABD: Soft non tender. Bowel sounds normal. Pelvic: fishy vag d/c no ilcers noted, uterus absent, tender exam, no adnexal mases Ext: No edema  MS: Adequate ROM spine, shoulders, hips and reduced i right knee.  Skin: Intact, no ulcerations or rash noted.  Psych: Good eye contact, normal  affect. Memory intact not anxious or depressed appearing.  CNS: CN 2-12 intact, power, tone and sensation normal throughout.        Assessment & Plan:

## 2012-11-07 NOTE — Assessment & Plan Note (Signed)
Recent std exposure, will test for all possible STD per pt request. Denies any current sexual activity or current relationship

## 2012-11-07 NOTE — Assessment & Plan Note (Signed)
Continues to smoke,states her nerves are torn up. Patient counseled for approximately 5 minutes regarding the health risks of ongoing nicotine use, specifically all types of cancer, heart disease, stroke and respiratory failure. The options available for help with cessation ,the behavioral changes to assist the process, and the option to either gradully reduce usage  Or abruptly stop.is also discussed. Pt is also encouraged to set specific goals in number of cigarettes used daily, as well as to set a quit date.

## 2012-11-16 ENCOUNTER — Encounter: Payer: Self-pay | Admitting: Cardiology

## 2012-11-19 ENCOUNTER — Encounter: Payer: Self-pay | Admitting: Cardiology

## 2012-11-19 ENCOUNTER — Encounter: Payer: Self-pay | Admitting: Internal Medicine

## 2012-11-19 ENCOUNTER — Ambulatory Visit (INDEPENDENT_AMBULATORY_CARE_PROVIDER_SITE_OTHER): Payer: PRIVATE HEALTH INSURANCE | Admitting: Cardiology

## 2012-11-19 VITALS — BP 111/81 | HR 85 | Ht 66.0 in | Wt 214.0 lb

## 2012-11-19 DIAGNOSIS — I472 Ventricular tachycardia, unspecified: Secondary | ICD-10-CM

## 2012-11-19 DIAGNOSIS — I2589 Other forms of chronic ischemic heart disease: Secondary | ICD-10-CM

## 2012-11-19 DIAGNOSIS — I255 Ischemic cardiomyopathy: Secondary | ICD-10-CM

## 2012-11-19 DIAGNOSIS — Z95810 Presence of automatic (implantable) cardiac defibrillator: Secondary | ICD-10-CM

## 2012-11-19 DIAGNOSIS — I4901 Ventricular fibrillation: Secondary | ICD-10-CM

## 2012-11-19 LAB — ICD DEVICE OBSERVATION
DEV-0020ICD: NEGATIVE
HV IMPEDENCE: 56 Ohm
PACEART VT: 0
RV LEAD AMPLITUDE: 14 mv
RV LEAD THRESHOLD: 3 V
TZAT-0001FASTVT: 1
TZAT-0001SLOWVT: 1
TZAT-0001SLOWVT: 2
TZAT-0002FASTVT: NEGATIVE
TZAT-0004SLOWVT: 8
TZAT-0004SLOWVT: 8
TZAT-0012SLOWVT: 200 ms
TZAT-0012SLOWVT: 200 ms
TZAT-0013FASTVT: 1
TZAT-0013SLOWVT: 2
TZAT-0013SLOWVT: 2
TZAT-0018FASTVT: NEGATIVE
TZAT-0018SLOWVT: NEGATIVE
TZAT-0019FASTVT: 8 V
TZAT-0019SLOWVT: 8 V
TZAT-0019SLOWVT: 8 V
TZAT-0020FASTVT: 1.6 ms
TZAT-0020SLOWVT: 1.6 ms
TZAT-0020SLOWVT: 1.6 ms
TZON-0003FASTVT: 260 ms
TZON-0003SLOWVT: 300 ms
TZON-0004SLOWVT: 28
TZON-0008FASTVT: 0 ms
TZON-0008SLOWVT: 0 ms
TZST-0001FASTVT: 2
TZST-0001FASTVT: 5
TZST-0001FASTVT: 6
TZST-0001SLOWVT: 3
TZST-0001SLOWVT: 5
TZST-0002FASTVT: NEGATIVE
TZST-0002FASTVT: NEGATIVE
TZST-0003FASTVT: 35 J
TZST-0003FASTVT: 35 J
TZST-0003FASTVT: 35 J
TZST-0003SLOWVT: 35 J
TZST-0003SLOWVT: 35 J
VENTRICULAR PACING ICD: 0 pct

## 2012-11-19 MED ORDER — METOPROLOL TARTRATE 50 MG PO TABS: 50.0000 mg | ORAL_TABLET | Freq: Two times a day (BID) | ORAL | Status: DC

## 2012-11-19 NOTE — Progress Notes (Signed)
ELECTROPHYSIOLOGY OFFICE NOTE  Patient ID: [name] MRN: [id number], DOB/AGE: 20-Nov-1965   Date of Visit: 11/19/2012  Primary Physician: Syliva Overman, MD Primary Cardiologist: Graciela Husbands, MD Reason for Visit: EP/device follow-up  History of Present Illness  [name] is a pleasant 47 year old woman with an ICM s/p ICD implant with h/o appropriate therapy for VF, chronic systolic HF and CAD who presents today for routine electrophysiology followup. Since last being seen in our clinic, she reports she is doing well. She was feeling fine when she got here today but now reports racing palpitations and a "funny feeling" in her chest and head. She denies CP, SOB or dizziness. She denies near syncope or syncope. She denies LE swelling, orthopnea, PND or recent weight gain. [name] reports she is compliant and tolerating medications without difficulty.  Past Medical History Past Medical History  Diagnosis Date  . coronary artery disease     RCA stenting Myoview 2011 EF 30% infarction dilatation without ischemia  . CHF (congestive heart failure), NYHA class III   . Automatic implantable cardiac defibrillator in situ     Medtronic  . Other and unspecified hyperlipidemia   . Cardiomyopathy secondary     Patient has cardiomyopathy out of proportion to her ischemic heart disease  . Depression   . GERD (gastroesophageal reflux disease)   . Nicotine abuse   . Insomnia   . Migraines   . Lupus (systemic lupus erythematosus)   . Arthritis of knee   . Myocardial infarction     8 total   . Coronary artery disease   . Diabetes mellitus     Past Surgical History Past Surgical History  Procedure Laterality Date  . Cholecystectomy    . Abdominal hysterectomy    . Rotator cuff repair  2002  . Defibrillator placed   2009  . Colonoscopy  07/15/2011    SLF: internal hemorrhoids/hyperplastic polyps in the rectum/tubular adenomaSURVEILLANCE Nov 2017      Allergies/Intolerances Allergies  Allergen Reactions  . Bee Venom Anaphylaxis  . Cortisone Swelling    Current Home Medications Current Outpatient Prescriptions  Medication Sig Dispense Refill  . Aspirin-Acetaminophen (GOODYS BODY PAIN PO) Take 1 Package by mouth daily as needed. For pain      . metFORMIN (GLUCOPHAGE XR) 500 MG 24 hr tablet Take 1 tablet (500 mg total) by mouth daily with breakfast.  30 tablet  11  . metoprolol tartrate (LOPRESSOR) 50 MG tablet Take 1 tablet (50 mg total) by mouth 2 (two) times daily.  60 tablet  12  . ondansetron (ZOFRAN) 4 MG tablet Take 1 tablet (4 mg total) by mouth every 6 (six) hours.  12 tablet  0  . rosuvastatin (CRESTOR) 40 MG tablet Take 1 tablet (40 mg total) by mouth daily.  30 tablet  5  . temazepam (RESTORIL) 30 MG capsule Take 1 capsule (30 mg total) by mouth at bedtime as needed for sleep.  30 capsule  3   No current facility-administered medications for this visit.    Social History Social History  . Marital Status: Single   Occupational History  . disabled    Social History Main Topics  . Smoking status: Current Every Day Smoker -- 0.50 packs/day for 7 years    Types: Cigarettes  . Smokeless tobacco: No  . Alcohol Use: Yes     Comment: occasionaly  . Drug Use: No   Review of Systems General: No chills, fever, night sweats or  weight changes Cardiovascular: No chest pain, dyspnea on exertion, edema, orthopnea, palpitations, paroxysmal nocturnal dyspnea Dermatological: No rash, lesions or masses Respiratory: No cough, dyspnea Urologic: No hematuria, dysuria Abdominal: No nausea, vomiting, diarrhea, bright red blood per rectum, melena, or hematemesis Neurologic: No visual changes, weakness, changes in mental status All other systems reviewed and are otherwise negative except as noted above.  Physical Exam Blood pressure 111/81, pulse 85, height 5\' 6"  (1.676 m), weight 214 lb (97.07 kg).  General: Well developed, well  appearing 47 year old female in no acute distress. HEENT: Normocephalic, atraumatic. EOMs intact. Sclera nonicteric. Oropharynx clear.  Neck: Supple. No JVD. Lungs: Respirations regular and unlabored, CTA bilaterally. No wheezes, rales or rhonchi. Heart: RRR. S1, S2 present. No murmurs, rub, S3 or S4. Abdomen: Soft, non-distended.  Extremities: No clubbing, cyanosis or edema. DP/PT/Radials 2+ and equal bilaterally. Psych: Normal affect. Neuro: Alert and oriented X 3. Moves all extremities spontaneously.   Diagnostics 12-lead ECG today reviewed by me and Dr. Johney Frame shows slow, monomorphic VT at 132 bpm Device interrogation today - Patient in slow, monomorphic VT at time of check. Dr. Johney Frame was in to evaluate patient and perform NIPS. Dr. Johney Frame was able to pace terminate the VT. VT zone was programmed to monitor at 130 bpm. FVT via VF was adjusted accordingly. Otherwise normal device function. Thresholds and sensing consistent with previous device measurements. Impedance trends stable over time. Other than on presentation, there is no evidence of any ventricular arrhythmias. Histogram distribution appropriate for patient and level of activity. Device programmed at appropriate safety margins. Device programmed to optimize intrinsic conduction.  Assessment and Plan 1. VT, monomorphic at 135 bpm Hemodynamically stable but symptomatic; while [name] was in the office, Dr. Johney Frame was able to pace terminate this slow VT Reprogrammed her VT zone to monitor at 130 bpm; her FVT via VF was programmed/adjusted accordingly Increase metoprolol to 50 mg twice daily Return to clinic for follow-up with Dr. Graciela Husbands in 4 weeks Reviewed shock plan and instructions should she develop recurrent symptoms 2. ICM s/p ICD implant (initially for primary prevention SCD with subsequent appropriate therapy for VF) Normal device function [name] in [place] during visit; see above details Return to clinic for follow-up  with Dr. Graciela Husbands in 4 weeks 3. Chronic systolic HF Stable; euvolemic by exam Continue medical therapy 4. CAD  Stable without anginal symptoms Continue medical therapy  Signed, Rick Duff, PA-C 11/19/2012, 4:26 PM

## 2012-11-19 NOTE — Patient Instructions (Signed)
Your physician recommends that you schedule a follow-up appointment in: 4 weeks  Your physician has recommended you make the following change in your medication: INCREASE Metoprolol to 50 mg twice daily

## 2012-12-15 ENCOUNTER — Telehealth: Payer: Self-pay | Admitting: Internal Medicine

## 2012-12-15 ENCOUNTER — Encounter (HOSPITAL_COMMUNITY): Payer: Self-pay

## 2012-12-15 ENCOUNTER — Emergency Department (HOSPITAL_COMMUNITY): Payer: PRIVATE HEALTH INSURANCE

## 2012-12-15 ENCOUNTER — Emergency Department (HOSPITAL_COMMUNITY)
Admission: EM | Admit: 2012-12-15 | Discharge: 2012-12-15 | Disposition: A | Discharge status: Home / Self Care | Payer: PRIVATE HEALTH INSURANCE | Attending: Emergency Medicine | Admitting: Emergency Medicine

## 2012-12-15 DIAGNOSIS — Z8679 Personal history of other diseases of the circulatory system: Secondary | ICD-10-CM | POA: Insufficient documentation

## 2012-12-15 DIAGNOSIS — E119 Type 2 diabetes mellitus without complications: Secondary | ICD-10-CM | POA: Insufficient documentation

## 2012-12-15 DIAGNOSIS — Z95810 Presence of automatic (implantable) cardiac defibrillator: Secondary | ICD-10-CM | POA: Insufficient documentation

## 2012-12-15 DIAGNOSIS — G43909 Migraine, unspecified, not intractable, without status migrainosus: Secondary | ICD-10-CM | POA: Insufficient documentation

## 2012-12-15 DIAGNOSIS — I252 Old myocardial infarction: Secondary | ICD-10-CM | POA: Insufficient documentation

## 2012-12-15 DIAGNOSIS — I509 Heart failure, unspecified: Secondary | ICD-10-CM | POA: Insufficient documentation

## 2012-12-15 DIAGNOSIS — G4700 Insomnia, unspecified: Secondary | ICD-10-CM | POA: Insufficient documentation

## 2012-12-15 DIAGNOSIS — E669 Obesity, unspecified: Secondary | ICD-10-CM | POA: Insufficient documentation

## 2012-12-15 DIAGNOSIS — Z8719 Personal history of other diseases of the digestive system: Secondary | ICD-10-CM | POA: Insufficient documentation

## 2012-12-15 DIAGNOSIS — R000 Tachycardia, unspecified: Secondary | ICD-10-CM | POA: Insufficient documentation

## 2012-12-15 DIAGNOSIS — F17200 Nicotine dependence, unspecified, uncomplicated: Secondary | ICD-10-CM | POA: Insufficient documentation

## 2012-12-15 DIAGNOSIS — Z8739 Personal history of other diseases of the musculoskeletal system and connective tissue: Secondary | ICD-10-CM | POA: Insufficient documentation

## 2012-12-15 DIAGNOSIS — Z79899 Other long term (current) drug therapy: Secondary | ICD-10-CM | POA: Insufficient documentation

## 2012-12-15 DIAGNOSIS — I2510 Atherosclerotic heart disease of native coronary artery without angina pectoris: Secondary | ICD-10-CM | POA: Insufficient documentation

## 2012-12-15 DIAGNOSIS — M1710 Unilateral primary osteoarthritis, unspecified knee: Secondary | ICD-10-CM | POA: Insufficient documentation

## 2012-12-15 DIAGNOSIS — R079 Chest pain, unspecified: Secondary | ICD-10-CM | POA: Insufficient documentation

## 2012-12-15 DIAGNOSIS — E785 Hyperlipidemia, unspecified: Secondary | ICD-10-CM | POA: Insufficient documentation

## 2012-12-15 DIAGNOSIS — Z8659 Personal history of other mental and behavioral disorders: Secondary | ICD-10-CM | POA: Insufficient documentation

## 2012-12-15 LAB — CBC WITH DIFFERENTIAL/PLATELET
Basophils Relative: 1 % (ref 0–1)
Eosinophils Relative: 2 % (ref 0–5)
HCT: 41.1 % (ref 36.0–46.0)
Hemoglobin: 13.9 g/dL (ref 12.0–15.0)
MCHC: 33.8 g/dL (ref 30.0–36.0)
MCV: 81.7 fL (ref 78.0–100.0)
Monocytes Absolute: 0.3 10*3/uL (ref 0.1–1.0)
Monocytes Relative: 4 % (ref 3–12)
Neutro Abs: 2.9 10*3/uL (ref 1.7–7.7)

## 2012-12-15 LAB — BASIC METABOLIC PANEL
BUN: 5 mg/dL — ABNORMAL LOW (ref 6–23)
CO2: 21 mEq/L (ref 19–32)
Calcium: 9.2 mg/dL (ref 8.4–10.5)
Chloride: 103 mEq/L (ref 96–112)
Creatinine, Ser: 0.67 mg/dL (ref 0.50–1.10)
GFR calc Af Amer: >90 mL/min (ref >90)

## 2012-12-15 NOTE — Telephone Encounter (Signed)
Spoke with pt, she is currently in [place] er, EKG's reviewed by dr Graciela Husbands. No changes at this time. Pt will keep follow up appt on Friday with dr Graciela Husbands. Pt voiced understanding

## 2012-12-15 NOTE — ED Notes (Signed)
Pt reports had episode of tachycardia in the cardiologists office 4 weeks ago.  Says the doctor was able to hook something up to her defibrillator and slow her HR down.

## 2012-12-15 NOTE — ED Notes (Signed)
Pt reports was sitting at home working a cross word puzzle and heart started racing.  C/O shortness of breath.  Denies pain.

## 2012-12-15 NOTE — ED Notes (Signed)
Pt ST on monitor upon entering room with rate 130-140 bpm.  While starting IV, pt converted, HR now 82, NSR on monitor.  Repeat ecg completed.  Pt reports feeling better but tired.  nad noted.  O2 applied at 2l/min for comfort.  Denies pain.

## 2012-12-15 NOTE — Telephone Encounter (Signed)
New problem    C/o fast heart rate.  Patient explain she does not know how to check. However, stated it running fast.

## 2012-12-15 NOTE — ED Provider Notes (Signed)
History    This chart was scribed for Donnetta Hutching, MD by Marlyne Beards, ED Scribe and Bennett Scrape, ED Scribe. The patient was seen in room APA05/APA05. Patient's care was started at 3:54 PM.    CSN: [id number]  Arrival date & time 12/15/12  1547   First MD Initiated Contact with Patient 12/15/12 1554      Chief Complaint  Patient presents with  . Tachycardia    The history is provided by the patient. No language interpreter was used.    HPI Comments: [name] is a 47 y.o. female with h/o CAD and CHFwho presents to the Emergency Department complaining of tachycardia onset today about 45 minutes pta. Pt was sitting down working on a 1000 piece puzzle when her heart started to race. She states her heart was beating out of her chest. She had another incident similar to this that happened 4 weeks ago. Pt currently has a defribrillator which has been in place for about 3-4 years now. Pt denies being on a beta blockers. Pt's current PCP is Dr. Lodema Hong.   Past Medical History  Diagnosis Date  . coronary artery disease     RCA stenting Myoview 2011 EF 30% infarction dilatation without ischemia  . CHF (congestive heart failure), NYHA class III   . Automatic implantable cardiac defibrillator in situ     [employer]tronic  . Other and unspecified hyperlipidemia   . Cardiomyopathy secondary     Patient has cardiomyopathy out of proportion to her ischemic heart disease  . Depression   . GERD (gastroesophageal reflux disease)   . Nicotine abuse   . Insomnia   . Migraines   . Lupus (systemic lupus erythematosus)   . Arthritis of knee   . Myocardial infarction     8 total   . Coronary artery disease   . Diabetes mellitus     Past Surgical History  Procedure Laterality Date  . Cholecystectomy    . Abdominal hysterectomy    . Rotator cuff repair  2002  . Defibrillator placed   2009  . Colonoscopy  07/15/2011    SLF: internal hemorrhoids/hyperplastic polyps in the rectum/tubular  adenomaSURVEILLANCE Nov 2017    Family History  Problem Relation Age of Onset  . Hepatitis Mother 60    HCV  . Liver cancer Mother 84    History  Substance Use Topics  . Smoking status: Current Every Day Smoker -- 0.50 packs/day for 7 years    Types: Cigarettes  . Smokeless tobacco: Not on file  . Alcohol Use: Yes     Comment: occasionaly    No OB history provided.  Review of Systems  Respiratory: Positive for shortness of breath. Negative for cough.   Cardiovascular: Negative for leg swelling.       Tachycardic  All other systems reviewed and are negative.    Allergies  Bee venom and Cortisone  Home Medications   Current Outpatient Rx  Name  Route  Sig  Dispense  Refill  . Aspirin-Acetaminophen (GOODYS BODY PAIN PO)   Oral   Take 1 Package by mouth daily as needed. For pain         . metFORMIN (GLUCOPHAGE XR) 500 MG 24 hr tablet   Oral   Take 1 tablet (500 mg total) by mouth daily with breakfast.   30 tablet   11   . metoprolol tartrate (LOPRESSOR) 50 MG tablet   Oral   Take 1 tablet (50 mg total) by mouth  2 (two) times daily.   60 tablet   12   . ondansetron (ZOFRAN) 4 MG tablet   Oral   Take 1 tablet (4 mg total) by mouth every 6 (six) hours.   12 tablet   0   . rosuvastatin (CRESTOR) 40 MG tablet   Oral   Take 1 tablet (40 mg total) by mouth daily.   30 tablet   5     Discontinue lipitor 80mg  , not effective .   . temazepam (RESTORIL) 30 MG capsule   Oral   Take 1 capsule (30 mg total) by mouth at bedtime as needed for sleep.   30 capsule   3     Pulse 133  Temp(Src) 97.8 F (36.6 C) (Oral)  Resp 24  Ht 5\' 6"  (1.676 m)  Wt 210 lb (95.255 kg)  BMI 33.91 kg/m2  SpO2 97%  Physical Exam  Nursing note and vitals reviewed. Constitutional: She is oriented to person, place, and time. She appears well-developed and well-nourished.  Pt is obese.  HENT:  Head: Normocephalic and atraumatic.  Poor dentition   Eyes: Conjunctivae are  normal.  Neck: Normal range of motion. No tracheal deviation present.  Cardiovascular: Normal rate, regular rhythm and normal heart sounds.   Pulmonary/Chest: Effort normal and breath sounds normal.  Musculoskeletal: Normal range of motion.  Neurological: She is alert and oriented to person, place, and time.  Skin: Skin is warm and dry.  Psychiatric: She has a normal mood and affect.    ED Course  Procedures (including critical care time)  DIAGNOSTIC STUDIES: Oxygen Saturation is 97% on room air, adequate by my interpretation.    COORDINATION OF CARE: 4:19 PM Pt's tachycardia has alleviated into a normal rhythm. Discussed of sending pt home due to sx's getting better here in the ED. Going to keep pt temporarily for further evaluation.    Labs Reviewed  CBC WITH DIFFERENTIAL - Abnormal; Notable for the following:    Lymphocytes Relative 49 (*)    All other components within normal limits  BASIC METABOLIC PANEL - Abnormal; Notable for the following:    Potassium 3.3 (*)    Glucose, Bld 163 (*)    BUN 5 (*)    All other components within normal limits  TROPONIN I   Dg Chest Portable 1 View  12/15/2012  *RADIOLOGY REPORT*  Clinical Data: Tachycardia.  PORTABLE CHEST - 1 VIEW  Comparison: 02/09/2012  Findings: 1619 hours. The cardiopericardial silhouette is enlarged. Single lead left-sided AICD remains in place.  No edema or focal airspace consolidation.  No pleural effusion. Telemetry leads overlie the chest.  IMPRESSION: Stable.  Cardiomegaly without acute cardiopulmonary findings.   Original Report Authenticated By: Kennith Center, M.D.      No diagnosis found.   Date: 12/15/2012  15:52  Rate: 136  Rhythm: Wide QRS tachycardia  QRS Axis: left  Intervals: normal  ST/T Wave abnormalities: normal  Conduction Disutrbances:right bundle branch block  Narrative Interpretation:   Old EKG Reviewed: changes noted   Date: 12/15/2012    16:07  Rate: 80  Rhythm: normal sinus  rhythm  QRS Axis: normal  Intervals: normal  ST/T Wave abnormalities: normal  Conduction Disutrbances:QRS widening  Narrative Interpretation:   Old EKG Reviewed: changes noted  MDM  Patient spontaneously converted to normal sinus rhythm.  She has cardiology followup. Screening tests within normal limits            Donnetta Hutching, MD 12/15/12 1844

## 2012-12-17 ENCOUNTER — Ambulatory Visit (INDEPENDENT_AMBULATORY_CARE_PROVIDER_SITE_OTHER): Payer: PRIVATE HEALTH INSURANCE | Admitting: Internal Medicine

## 2012-12-17 ENCOUNTER — Encounter: Payer: Self-pay | Admitting: Internal Medicine

## 2012-12-17 VITALS — BP 108/80 | HR 80 | Ht 66.0 in | Wt 219.0 lb

## 2012-12-17 DIAGNOSIS — I472 Ventricular tachycardia: Secondary | ICD-10-CM

## 2012-12-17 DIAGNOSIS — I255 Ischemic cardiomyopathy: Secondary | ICD-10-CM

## 2012-12-17 DIAGNOSIS — I2589 Other forms of chronic ischemic heart disease: Secondary | ICD-10-CM

## 2012-12-17 DIAGNOSIS — Z95810 Presence of automatic (implantable) cardiac defibrillator: Secondary | ICD-10-CM

## 2012-12-17 LAB — ICD DEVICE OBSERVATION
BRDY-0002RV: 40 {beats}/min
CHARGE TIME: 11.55 s
DEV-0020ICD: NEGATIVE
RV LEAD AMPLITUDE: 13.3 mv
RV LEAD IMPEDENCE ICD: 432 Ohm
RV LEAD THRESHOLD: 1 V
TZAT-0001FASTVT: 1
TZAT-0004SLOWVT: 8
TZAT-0004SLOWVT: 8
TZAT-0005FASTVT: 88 pct
TZAT-0011FASTVT: 10 ms
TZAT-0012SLOWVT: 200 ms
TZAT-0012SLOWVT: 200 ms
TZAT-0013FASTVT: 1
TZAT-0013SLOWVT: 2
TZAT-0013SLOWVT: 2
TZAT-0018FASTVT: NEGATIVE
TZAT-0018SLOWVT: NEGATIVE
TZAT-0019FASTVT: 8 V
TZAT-0019SLOWVT: 8 V
TZAT-0020SLOWVT: 1.6 ms
TZAT-0020SLOWVT: 1.6 ms
TZON-0003SLOWVT: 460 ms
TZON-0004SLOWVT: 28
TZON-0005SLOWVT: 12
TZON-0008FASTVT: 0 ms
TZON-0008SLOWVT: 0 ms
TZON-0011AFLUTTER: 70
TZST-0001FASTVT: 3
TZST-0001FASTVT: 5
TZST-0001SLOWVT: 5
TZST-0001SLOWVT: 6
TZST-0002SLOWVT: NEGATIVE
TZST-0003FASTVT: 35 J
TZST-0003FASTVT: 35 J
TZST-0003SLOWVT: 35 J
TZST-0003SLOWVT: 35 J
VENTRICULAR PACING ICD: 0 pct
VF: 0

## 2012-12-17 MED ORDER — LISINOPRIL 2.5 MG PO TABS: 2.5000 mg | ORAL_TABLET | Freq: Every day | ORAL | Status: DC

## 2012-12-17 NOTE — Assessment & Plan Note (Signed)
She needs reassessment of coronary perfusion and LV function  She is not able to ambulate adequately so will use lexiscan.  We also need to get her on ace inhibitor  For LV dysfunction and renal protection with her diabetes It may also help with hypokalemia Will need bmet in 21 weeks

## 2012-12-17 NOTE — Patient Instructions (Addendum)
Your physician recommends that you schedule a follow-up appointment in: 3 months  Your physician recommends that you return for lab work in: 2 weeks - BMP ( in [place])  Your physician has recommended you make the following change in your medication: STOP Metoprolol Tartrate ----- START Lisinopril 2.5 mg daily --- TRY  Metoprolol Succ 50 mg take 1/2 nightly for 1 week then take a whole tablet after / Bisoprolol 5 mg take 1/2 tab nightly for 1 week then take a whole tablet after

## 2012-12-17 NOTE — Assessment & Plan Note (Signed)
The patient's device was interrogated and the information was fully reviewed.  The device was reprogrammed to  As above 

## 2012-12-17 NOTE — Assessment & Plan Note (Signed)
Recurrent at HR 130-35  We will reporgram her device to 125 and increase her betablocker  No shocks in the first zone  We will try her on meto succ 50 and bisopr 5 to see if she can tolerate this.   She may need an antiarrhythmic drug or consideration iof catheter ablation  We will anticipate a stress test on betablockers to assess hr excursion

## 2012-12-17 NOTE — Progress Notes (Signed)
Patient Care Team: Kerri Perches, MD as PCP - General [place] Bali, MD (Gastroenterology)   HPI  [name] is a 47 y.o. female is seen in followup for coronary artery disease with prior inferior wall MI treated with stenting.  She is status post ICD implantation for primary prevention. She then had subsequent appropriate therapy for ventricular fibrillation. The records also suggest that she has had inappropriate therapy.    The patient denies chest pain, shortness of breath, nocturnal dyspnea, orthopnea or peripheral edema. There have been no palpitations, lightheadedness or syncope.  Recent nuclear imaging (3/11) listed under NOTES-CV procedure--demonstrated ejection fraction of 30% with an infarct and no ischemia.    She has completed her associates and bachelors degree in business now her masters  She was seen in the office the other day and [place] was paceterminated and her betablcoker increased She feels terrible and 2 days ago ended up back in the ER in [place]-sustained  It terminated spontaneously She continues to feel terrible  Past Medical History  Diagnosis Date  . coronary artery disease     RCA stenting Myoview 2011 EF 30% infarction dilatation without ischemia  . CHF (congestive heart failure), NYHA class III   . Automatic implantable cardiac defibrillator in situ     [employer]tr[employer]  . Other and unspecified hyperlipidemia   . Cardiomyopathy secondary     Patient has cardiomyopathy out of proportion to her ischemic heart disease  . Depression   . GERD (gastroesophageal reflux disease)   . Nicotine abuse   . Insomnia   . Migraines   . Lupus (systemic lupus erythematosus)   . Arthritis of knee   . Myocardial infarction     8 total   . Coronary artery disease   . Diabetes mellitus     Past Surgical History  Procedure Laterality Date  . Cholecystectomy    . Abdominal hysterectomy    . Rotator cuff repair  2002  . Defibrillator placed   2009  . Colonoscopy   07/15/2011    SLF: internal hemorrhoids/hyperplastic polyps in the rectum/tubular adenomaSURVEILLANCE Nov 2017    Current Outpatient Prescriptions  Medication Sig Dispense Refill  . Aspirin-Acetaminophen (GOODYS BODY PAIN PO) Take 1 Package by mouth daily as needed. For pain      . metFORMIN (GLUCOPHAGE XR) 500 MG 24 hr tablet Take 1 tablet (500 mg total) by mouth daily with breakfast.  30 tablet  11  . metoprolol tartrate (LOPRESSOR) 50 MG tablet Take 1 tablet (50 mg total) by mouth 2 (two) times daily.  60 tablet  12  . rosuvastatin (CRESTOR) 40 MG tablet Take 1 tablet (40 mg total) by mouth daily.  30 tablet  5   No current facility-administered medications for this visit.    Allergies  Allergen Reactions  . Bee Venom Anaphylaxis  . Cortisone Swelling    Review of Systems negative except from HPI and PMH  Physical Exam BP 108/80  Pulse 80  Ht 5\' 6"  (1.676 m)  Wt 219 lb (99.338 kg)  BMI 35.36 kg/m2 Well developed and nourished in no acute distress HENT normal Neck supple with JVP-flat Clear Regular rate and rhythm, no murmurs or gallops Abd-soft with active BS No Clubbing cyanosis edema Skin-warm and dry A & Oriented  Grossly normal sensory and motor function     Assessment and  Plan

## 2013-01-25 LAB — BASIC METABOLIC PANEL
Chloride: 109 mEq/L (ref 96–112)
Potassium: 4.2 mEq/L (ref 3.5–5.3)
Sodium: 140 mEq/L (ref 135–145)

## 2013-02-02 ENCOUNTER — Telehealth: Payer: Self-pay | Admitting: *Deleted

## 2013-02-02 NOTE — Telephone Encounter (Signed)
Called pt to discuss lab results. She reports she took the metoprolol and it caused her extreme fatigue but she was unable to sleep. After 8 days she switched to the bisoprolol. She has been taking the bisoprolol since 01-09-13. She cont to have extreme fatigue, sleepless nights and easy bruising. She wants to know what else she can try. Aware dr Graciela Husbands will be back in the office tomorrow and will discuss with him and call her back. Pt agreed with this plan.

## 2013-02-08 MED ORDER — CARVEDILOL 3.125 MG PO TABS: 3.1250 mg | ORAL_TABLET | Freq: Two times a day (BID) | ORAL | Status: DC

## 2013-02-08 NOTE — Telephone Encounter (Signed)
Discussed with dr Graciela Husbands, pt to try carvedilol 3.125 mg one tablet twice daily.Spoke with pt, script sent to pharm, she will call with problems.

## 2013-03-15 ENCOUNTER — Ambulatory Visit (INDEPENDENT_AMBULATORY_CARE_PROVIDER_SITE_OTHER): Payer: PRIVATE HEALTH INSURANCE | Admitting: Family Medicine

## 2013-03-15 ENCOUNTER — Encounter: Payer: Self-pay | Admitting: Family Medicine

## 2013-03-15 VITALS — BP 120/80 | HR 80 | Resp 18 | Ht 66.0 in | Wt 217.0 lb

## 2013-03-15 DIAGNOSIS — F411 Generalized anxiety disorder: Secondary | ICD-10-CM

## 2013-03-15 DIAGNOSIS — F17200 Nicotine dependence, unspecified, uncomplicated: Secondary | ICD-10-CM

## 2013-03-15 DIAGNOSIS — E669 Obesity, unspecified: Secondary | ICD-10-CM

## 2013-03-15 DIAGNOSIS — R5383 Other fatigue: Secondary | ICD-10-CM

## 2013-03-15 DIAGNOSIS — R5381 Other malaise: Secondary | ICD-10-CM

## 2013-03-15 DIAGNOSIS — E785 Hyperlipidemia, unspecified: Secondary | ICD-10-CM

## 2013-03-15 DIAGNOSIS — I10 Essential (primary) hypertension: Secondary | ICD-10-CM

## 2013-03-15 DIAGNOSIS — N3000 Acute cystitis without hematuria: Secondary | ICD-10-CM | POA: Insufficient documentation

## 2013-03-15 DIAGNOSIS — M329 Systemic lupus erythematosus, unspecified: Secondary | ICD-10-CM

## 2013-03-15 DIAGNOSIS — E119 Type 2 diabetes mellitus without complications: Secondary | ICD-10-CM

## 2013-03-15 LAB — POCT URINALYSIS DIPSTICK
Glucose, UA: NEGATIVE
Nitrite, UA: POSITIVE
Protein, UA: NEGATIVE
Urobilinogen, UA: 0.2

## 2013-03-15 MED ORDER — CIPROFLOXACIN HCL 500 MG PO TABS: 500.0000 mg | ORAL_TABLET | Freq: Two times a day (BID) | ORAL | Status: AC

## 2013-03-15 MED ORDER — ALPRAZOLAM 0.25 MG PO TABS: ORAL_TABLET | ORAL | Status: AC

## 2013-03-15 NOTE — Patient Instructions (Addendum)
F/u in end November, please call if you need me before  Pneumonia vaccine today  You need fastiig labs before the end of this week please, lipid, cmp and EGFR, hBA1C, TSH. Result note will be sent to you  Microalbumin  And CCUA today, you appear to have urine infection will be  treat ed and I will f/u c/sHBA1C fasting lipid , cmp and EGFR end Novemebr, before f/u visit  Please commit to reducing sweets and carbs so you lose weight and also cutting back on cigarettes you need to quit

## 2013-03-15 NOTE — Progress Notes (Signed)
  Subjective:    Patient ID: [name], female    DOB: 02-11-1966, 47 y.o.   MRN: [id number]  HPI The PT is here for follow up and re-evaluation of chronic medical conditions, medication management and review of any available recent lab and radiology data.  Preventive health is updated, specifically  Cancer screening and Immunization.   Has been having problems with her defibrillator for the past few weeks and may need intervention done The PT denies any adverse reactions to current medications since the last visit.  There are no new concerns.  There are no specific complaints       Review of Systems See HPI Denies recent fever or chills. Denies sinus pressure, nasal congestion, ear pain or sore throat. Denies chest congestion, productive cough or wheezing. Denies chest pains, PND , orthopnea and leg swelling Denies abdominal pain, nausea, vomiting,diarrhea or constipation.   Denies dysuria, frequency, hesitancy or incontinence.c/o malodorous urine, and mild suprapubic tenderness, no flank pain Chronic joint pain,no  swelling and limitation in mobility. Denies headaches, seizures, numbness, or tingling. Denies depression, anxiety or insomnia. Denies skin break down or rash.        Objective:   Physical Exam  Patient alert and oriented and in no cardiopulmonary distress.  HEENT: No facial asymmetry, EOMI, no sinus tenderness,  oropharynx pink and moist.  Neck supple no adenopathy.  Chest: Clear to auscultation bilaterally.Decreased though adequate air entry  CVS: S1, S2 no murmurs, no S3.  ABD: Soft non tender. Bowel sounds normal.  Ext: No edema  MS: Adequate ROM spine, shoulders, hips and knees.  Skin: Intact, no ulcerations or rash noted.  Psych: Good eye contact, normal affect. Memory intact not anxious or depressed appearing.  CNS: CN 2-12 intact, power, tone and sensation normal throughout.       Assessment & Plan:

## 2013-03-16 ENCOUNTER — Telehealth: Payer: Self-pay | Admitting: Family Medicine

## 2013-03-16 NOTE — Telephone Encounter (Signed)
rx faxed after appointment

## 2013-03-17 LAB — MICROALBUMIN / CREATININE URINE RATIO: Microalb Creat Ratio: 7.5 mg/g (ref 0.0–30.0)

## 2013-03-18 LAB — URINE CULTURE: Colony Count: >=100000

## 2013-03-19 NOTE — Assessment & Plan Note (Signed)
Controlled, no change in medication DASH diet and commitment to daily physical activity for a minimum of 30 minutes discussed and encouraged, as a part of hypertension management. The importance of attaining a healthy weight is also discussed.  

## 2013-03-19 NOTE — Assessment & Plan Note (Signed)
Controlled, followed by nephrology [facility] pain management for joint pain through nephrologist also

## 2013-03-19 NOTE — Assessment & Plan Note (Signed)
Markedly abnormal UA, pt in denial and elects  To wait on c/s

## 2013-03-19 NOTE — Assessment & Plan Note (Signed)
Deteriorated. Patient re-educated about  the importance of commitment to a  minimum of 150 minutes of exercise per week. The importance of healthy food choices with portion control discussed. Encouraged to start a food diary, count calories and to consider  joining a support group. Sample diet sheets offered. Goals set by the patient for the next several months.    

## 2013-03-19 NOTE — Assessment & Plan Note (Signed)
Updated lab needed  Patient advised to reduce carb and sweets, commit to regular physical activity, take meds as prescribed, test blood as directed, and attempt to lose weight, to improve blood sugar control.  

## 2013-03-19 NOTE — Assessment & Plan Note (Signed)
Uncontrolled in March, rept lab fo next visit Hyperlipidemia:Low fat diet discussed and encouraged.

## 2013-03-19 NOTE — Assessment & Plan Note (Signed)
Ongoing use, no commitment to quitting Patient counseled for approximately 5 minutes regarding the health risks of ongoing nicotine use, specifically all types of cancer, heart disease, stroke and respiratory failure. The options available for help with cessation ,the behavioral changes to assist the process, and the option to either gradully reduce usage  Or abruptly stop.is also discussed. Pt is also encouraged to set specific goals in number of cigarettes used daily, as well as to set a quit date.

## 2013-03-23 ENCOUNTER — Other Ambulatory Visit: Payer: Self-pay

## 2013-03-25 ENCOUNTER — Encounter (HOSPITAL_COMMUNITY): Payer: Self-pay | Admitting: *Deleted

## 2013-03-25 ENCOUNTER — Emergency Department (HOSPITAL_COMMUNITY)
Admission: EM | Admit: 2013-03-25 | Discharge: 2013-03-25 | Discharge status: Against Medical Advice | Payer: PRIVATE HEALTH INSURANCE | Attending: Emergency Medicine | Admitting: Emergency Medicine

## 2013-03-25 ENCOUNTER — Other Ambulatory Visit: Payer: Self-pay

## 2013-03-25 DIAGNOSIS — I2510 Atherosclerotic heart disease of native coronary artery without angina pectoris: Secondary | ICD-10-CM | POA: Insufficient documentation

## 2013-03-25 DIAGNOSIS — Y929 Unspecified place or not applicable: Secondary | ICD-10-CM | POA: Insufficient documentation

## 2013-03-25 DIAGNOSIS — S30860A Insect bite (nonvenomous) of lower back and pelvis, initial encounter: Secondary | ICD-10-CM | POA: Insufficient documentation

## 2013-03-25 DIAGNOSIS — R1110 Vomiting, unspecified: Secondary | ICD-10-CM | POA: Insufficient documentation

## 2013-03-25 DIAGNOSIS — Y939 Activity, unspecified: Secondary | ICD-10-CM | POA: Insufficient documentation

## 2013-03-25 DIAGNOSIS — W57XXXA Bitten or stung by nonvenomous insect and other nonvenomous arthropods, initial encounter: Secondary | ICD-10-CM | POA: Insufficient documentation

## 2013-03-25 DIAGNOSIS — F17200 Nicotine dependence, unspecified, uncomplicated: Secondary | ICD-10-CM | POA: Insufficient documentation

## 2013-03-25 DIAGNOSIS — S40269A Insect bite (nonvenomous) of unspecified shoulder, initial encounter: Secondary | ICD-10-CM | POA: Insufficient documentation

## 2013-03-25 DIAGNOSIS — E119 Type 2 diabetes mellitus without complications: Secondary | ICD-10-CM | POA: Insufficient documentation

## 2013-03-25 MED ORDER — FLUCONAZOLE 150 MG PO TABS: 150.0000 mg | ORAL_TABLET | Freq: Once | ORAL | Status: AC

## 2013-03-25 NOTE — ED Notes (Addendum)
Insect bite to rt upper arm and back, on Tuesday, since then has felt sleepy, and vomiting. . No diarrhea. No fever.Small blister on rt upper arm

## 2013-03-28 ENCOUNTER — Encounter: Payer: Self-pay | Admitting: Internal Medicine

## 2013-03-28 ENCOUNTER — Ambulatory Visit (INDEPENDENT_AMBULATORY_CARE_PROVIDER_SITE_OTHER): Payer: PRIVATE HEALTH INSURANCE | Admitting: Internal Medicine

## 2013-03-28 VITALS — BP 106/74 | HR 95 | Ht 66.0 in | Wt 217.0 lb

## 2013-03-28 DIAGNOSIS — Z95810 Presence of automatic (implantable) cardiac defibrillator: Secondary | ICD-10-CM

## 2013-03-28 DIAGNOSIS — I255 Ischemic cardiomyopathy: Secondary | ICD-10-CM

## 2013-03-28 DIAGNOSIS — I4901 Ventricular fibrillation: Secondary | ICD-10-CM

## 2013-03-28 DIAGNOSIS — T82198A Other mechanical complication of other cardiac electronic device, initial encounter: Secondary | ICD-10-CM

## 2013-03-28 DIAGNOSIS — I472 Ventricular tachycardia: Secondary | ICD-10-CM

## 2013-03-28 DIAGNOSIS — I2589 Other forms of chronic ischemic heart disease: Secondary | ICD-10-CM

## 2013-03-28 NOTE — Assessment & Plan Note (Signed)
The patient's device was interrogated.  The information was reviewed. No changes were made in the programming.    

## 2013-03-28 NOTE — Patient Instructions (Addendum)
Call office in about 2 weeks   Your physician recommends that you schedule a follow-up appointment in: 3 month with Dr. Graciela Husbands

## 2013-03-28 NOTE — Assessment & Plan Note (Addendum)
  We will continue to use low-dose beta blockers as tolerated.

## 2013-03-28 NOTE — Assessment & Plan Note (Addendum)
Multiple episodes of tachycardia are identified; review of the electrograms suggested they are all sinus tachycardia.

## 2013-03-28 NOTE — Progress Notes (Signed)
Patient Care Team: Kerri Perches, MD as PCP - [facility] [place] Bali, MD (Gastroenterology)   HPI  [name] is a 47 y.o. female seen in followup for coronary artery disease with prior inferior wall MI treated with stenting.  She is status post ICD implantation for primary prevention. She then had subsequent appropriate therapy for ventricular fibrillation. The records also suggest that she has had inappropriate therapy.   She had recurrent slow ventricular tachycardia 4/14 which was pace terminated in the office. Beta blockers were increased  She was unable to tolerate atenolol or bisoprolol; she has tolerated low-dose carvedilol. ACE inhibitors were started .post initiation blood work was normal 6/14   The patient denies chest pain, shortness of breath, nocturnal dyspnea, orthopnea or peripheral edema. There have been no palpitations, lightheadedness or syncope.  Recent nuclear imaging (3/11) listed under NOTES-CV procedure--demonstrated ejection fraction of 30% with an infarct and no ischemia.   She has completed her associates and bachelors degree in business now her masters   She was seen in the office the other day and [place] was paceterminated and her betablcoker increased      Past Medical History  Diagnosis Date  . coronary artery disease     RCA stenting Myoview 2011 EF 30% infarction dilatation without ischemia  . CHF (congestive heart failure), NYHA class III   . Automatic implantable cardiac defibrillator in situ     [employer]tr[employer]  . Other and unspecified hyperlipidemia   . Cardiomyopathy secondary     Patient has cardiomyopathy out of proportion to her ischemic heart disease  . Depression   . GERD (gastroesophageal reflux disease)   . Nicotine abuse   . Insomnia   . Migraines   . Lupus (systemic lupus erythematosus)   . Arthritis of knee   . Myocardial infarction     8 total   . Coronary artery disease   . Diabetes mellitus     Past Surgical History   Procedure Laterality Date  . Cholecystectomy    . Abdominal hysterectomy    . Rotator cuff repair  2002  . Defibrillator placed   2009  . Colonoscopy  07/15/2011    SLF: internal hemorrhoids/hyperplastic polyps in the rectum/tubular adenomaSURVEILLANCE Nov 2017    Current Outpatient Prescriptions  Medication Sig Dispense Refill  . ALPRAZolam (XANAX) 0.25 MG tablet One tablet at bedtime for anxiety and sleep  30 tablet  3  . Aspirin-Acetaminophen (GOODYS BODY PAIN PO) Take 1 Package by mouth daily as needed. For pain      . carvedilol (COREG) 3.125 MG tablet Take 1 tablet (3.125 mg total) by mouth 2 (two) times daily.  60 tablet  12  . lisinopril (PRINIVIL,ZESTRIL) 2.5 MG tablet Take 1 tablet (2.5 mg total) by mouth daily.  90 tablet  3  . metFORMIN (GLUCOPHAGE XR) 500 MG 24 hr tablet Take 1 tablet (500 mg total) by mouth daily with breakfast.  30 tablet  11  . rosuvastatin (CRESTOR) 40 MG tablet Take 1 tablet (40 mg total) by mouth daily.  30 tablet  5   No current facility-administered medications for this visit.    Allergies  Allergen Reactions  . Bee Venom Anaphylaxis  . Cortisone Swelling    Review of Systems negative except from HPI and PMH  Physical Exam BP 106/74  Pulse 95  Ht 5\' 6"  (1.676 m)  Wt 217 lb (98.431 kg)  BMI 35.04 kg/m2 Well developed and well nourished in no acute distress  HENT normal E scleral and icterus clear Neck Supple JVP flat; carotids brisk and full Clear to ausculation  Regular rate and rhythm, no murmurs gallops or rub Soft with active bowel sounds No clubbing cyanosis none Edema Alert and oriented, grossly normal motor and sensory function Skin Warm and Dry    Assessment and  Plan

## 2013-03-28 NOTE — Assessment & Plan Note (Signed)
stable °

## 2013-04-01 ENCOUNTER — Encounter: Payer: Self-pay | Admitting: Internal Medicine

## 2013-04-07 LAB — ICD DEVICE OBSERVATION
DEV-0020ICD: NEGATIVE
FVT: 0
PACEART VT: 47
RV LEAD AMPLITUDE: 11.9 mv
RV LEAD IMPEDENCE ICD: 464 Ohm
TZAT-0005SLOWVT: 84 pct
TZAT-0005SLOWVT: 91 pct
TZAT-0011FASTVT: 10 ms
TZAT-0011SLOWVT: 10 ms
TZAT-0011SLOWVT: 10 ms
TZAT-0012FASTVT: 200 ms
TZAT-0012SLOWVT: 200 ms
TZAT-0013FASTVT: 1
TZAT-0013SLOWVT: 6
TZAT-0013SLOWVT: 8
TZAT-0018FASTVT: NEGATIVE
TZAT-0018SLOWVT: NEGATIVE
TZAT-0018SLOWVT: NEGATIVE
TZAT-0019FASTVT: 8 V
TZAT-0019SLOWVT: 8 V
TZAT-0019SLOWVT: 8 V
TZAT-0020FASTVT: 1.6 ms
TZON-0003FASTVT: 200 ms
TZON-0004SLOWVT: 28
TZON-0005SLOWVT: 12
TZON-0008FASTVT: 0 ms
TZON-0008SLOWVT: 0 ms
TZST-0001FASTVT: 3
TZST-0001FASTVT: 4
TZST-0001FASTVT: 6
TZST-0001SLOWVT: 4
TZST-0001SLOWVT: 6
TZST-0002SLOWVT: NEGATIVE
TZST-0002SLOWVT: NEGATIVE
TZST-0003FASTVT: 35 J
TZST-0003FASTVT: 35 J
TZST-0003FASTVT: 35 J
TZST-0003SLOWVT: 35 J
TZST-0003SLOWVT: 35 J
TZST-0003SLOWVT: 35 J
VENTRICULAR PACING ICD: 0 pct
VF: 0

## 2013-04-15 ENCOUNTER — Other Ambulatory Visit: Payer: Self-pay | Admitting: Family Medicine

## 2013-04-15 ENCOUNTER — Other Ambulatory Visit: Payer: Self-pay

## 2013-04-15 LAB — TSH: TSH: 1.342 u[IU]/mL (ref 0.350–4.500)

## 2013-04-15 LAB — COMPLETE METABOLIC PANEL WITH GFR
ALT: 24 U/L (ref 0–35)
CO2: 25 mEq/L (ref 19–32)
Calcium: 9.3 mg/dL (ref 8.4–10.5)
Chloride: 106 mEq/L (ref 96–112)
GFR, Est African American: >89 mL/min
Sodium: 137 mEq/L (ref 135–145)
Total Protein: 7.7 g/dL (ref 6.0–8.3)

## 2013-04-15 LAB — LIPID PANEL
HDL: 27 mg/dL — ABNORMAL LOW (ref >39)
LDL Cholesterol: 120 mg/dL — ABNORMAL HIGH (ref 0–99)
VLDL: 27 mg/dL (ref 0–40)

## 2013-04-15 LAB — HEMOGLOBIN A1C: Mean Plasma Glucose: 143 mg/dL — ABNORMAL HIGH (ref <117)

## 2013-04-15 MED ORDER — FENOFIBRATE 145 MG PO TABS: 145.0000 mg | ORAL_TABLET | Freq: Every day | ORAL | Status: DC

## 2013-04-28 ENCOUNTER — Ambulatory Visit (INDEPENDENT_AMBULATORY_CARE_PROVIDER_SITE_OTHER): Payer: PRIVATE HEALTH INSURANCE | Admitting: *Deleted

## 2013-04-28 DIAGNOSIS — I255 Ischemic cardiomyopathy: Secondary | ICD-10-CM

## 2013-04-28 DIAGNOSIS — I2589 Other forms of chronic ischemic heart disease: Secondary | ICD-10-CM

## 2013-04-28 LAB — ICD DEVICE OBSERVATION
BATTERY VOLTAGE: 2.64 V
TZAT-0004FASTVT: 8
TZAT-0005SLOWVT: 84 pct
TZAT-0005SLOWVT: 91 pct
TZAT-0011FASTVT: 10 ms
TZAT-0011SLOWVT: 10 ms
TZAT-0011SLOWVT: 10 ms
TZAT-0012FASTVT: 200 ms
TZAT-0013SLOWVT: 6
TZAT-0018SLOWVT: NEGATIVE
TZAT-0018SLOWVT: NEGATIVE
TZON-0003FASTVT: 200 ms
TZON-0004SLOWVT: 28
TZON-0005SLOWVT: 12
TZON-0008FASTVT: 0 ms
TZON-0011AFLUTTER: 70
TZST-0001FASTVT: 3
TZST-0001FASTVT: 5
TZST-0001SLOWVT: 4
TZST-0001SLOWVT: 6
TZST-0002SLOWVT: NEGATIVE
TZST-0002SLOWVT: NEGATIVE
TZST-0003FASTVT: 35 J
TZST-0003FASTVT: 35 J
TZST-0003SLOWVT: 35 J

## 2013-04-28 NOTE — Progress Notes (Signed)
ICD interrogation for battery longevity in office. 

## 2013-05-26 ENCOUNTER — Encounter: Payer: Self-pay | Admitting: Internal Medicine

## 2013-05-30 ENCOUNTER — Ambulatory Visit (INDEPENDENT_AMBULATORY_CARE_PROVIDER_SITE_OTHER): Payer: PRIVATE HEALTH INSURANCE | Admitting: *Deleted

## 2013-05-30 DIAGNOSIS — Z4502 Encounter for adjustment and management of automatic implantable cardiac defibrillator: Secondary | ICD-10-CM

## 2013-05-30 LAB — ICD DEVICE OBSERVATION
BATTERY VOLTAGE: 2.62 V
PACEART VT: 0
RV LEAD IMPEDENCE ICD: 416 Ohm
TZAT-0001SLOWVT: 1
TZAT-0001SLOWVT: 2
TZAT-0004SLOWVT: 8
TZAT-0004SLOWVT: 8
TZAT-0005FASTVT: 88 pct
TZAT-0005SLOWVT: 84 pct
TZAT-0005SLOWVT: 91 pct
TZAT-0011FASTVT: 10 ms
TZAT-0011SLOWVT: 10 ms
TZAT-0011SLOWVT: 10 ms
TZAT-0012FASTVT: 200 ms
TZAT-0012SLOWVT: 200 ms
TZAT-0013SLOWVT: 6
TZAT-0013SLOWVT: 8
TZAT-0018FASTVT: NEGATIVE
TZAT-0018SLOWVT: NEGATIVE
TZAT-0018SLOWVT: NEGATIVE
TZAT-0019FASTVT: 8 V
TZON-0003FASTVT: 200 ms
TZON-0003SLOWVT: 460 ms
TZON-0004SLOWVT: 28
TZON-0005SLOWVT: 12
TZON-0008FASTVT: 0 ms
TZON-0011AFLUTTER: 70
TZST-0001FASTVT: 3
TZST-0001FASTVT: 5
TZST-0001FASTVT: 6
TZST-0001SLOWVT: 3
TZST-0001SLOWVT: 4
TZST-0001SLOWVT: 6
TZST-0002SLOWVT: NEGATIVE
TZST-0002SLOWVT: NEGATIVE
TZST-0003FASTVT: 35 J
TZST-0003FASTVT: 35 J
TZST-0003FASTVT: 35 J
TZST-0003SLOWVT: 35 J
TZST-0003SLOWVT: 35 J
VENTRICULAR PACING ICD: 0 pct
VF: 0

## 2013-05-30 NOTE — Progress Notes (Signed)
Battery reached ERI of 2.62V on 05/28/13.  ROV w/ Nehemiah Settle 06/14/13.

## 2013-06-14 ENCOUNTER — Encounter: Payer: Self-pay | Admitting: Cardiology

## 2013-06-14 ENCOUNTER — Ambulatory Visit (INDEPENDENT_AMBULATORY_CARE_PROVIDER_SITE_OTHER): Payer: PRIVATE HEALTH INSURANCE | Admitting: Cardiology

## 2013-06-14 ENCOUNTER — Encounter: Payer: Self-pay | Admitting: *Deleted

## 2013-06-14 VITALS — BP 110/80 | HR 64 | Ht 66.0 in | Wt 215.8 lb

## 2013-06-14 DIAGNOSIS — T82198D Other mechanical complication of other cardiac electronic device, subsequent encounter: Secondary | ICD-10-CM

## 2013-06-14 DIAGNOSIS — Z4502 Encounter for adjustment and management of automatic implantable cardiac defibrillator: Secondary | ICD-10-CM

## 2013-06-14 DIAGNOSIS — I255 Ischemic cardiomyopathy: Secondary | ICD-10-CM

## 2013-06-14 DIAGNOSIS — Z95810 Presence of automatic (implantable) cardiac defibrillator: Secondary | ICD-10-CM

## 2013-06-14 DIAGNOSIS — I5022 Chronic systolic (congestive) heart failure: Secondary | ICD-10-CM

## 2013-06-14 DIAGNOSIS — I2589 Other forms of chronic ischemic heart disease: Secondary | ICD-10-CM

## 2013-06-14 DIAGNOSIS — Z5189 Encounter for other specified aftercare: Secondary | ICD-10-CM

## 2013-06-14 DIAGNOSIS — I472 Ventricular tachycardia: Secondary | ICD-10-CM

## 2013-06-14 DIAGNOSIS — I509 Heart failure, unspecified: Secondary | ICD-10-CM

## 2013-06-14 LAB — CBC WITH DIFFERENTIAL/PLATELET
Basophils Relative: 1.3 % (ref 0.0–3.0)
Eosinophils Relative: 3.3 % (ref 0.0–5.0)
Hemoglobin: 13.2 g/dL (ref 12.0–15.0)
Lymphocytes Relative: 40.1 % (ref 12.0–46.0)
Monocytes Relative: 5 % (ref 3.0–12.0)
Neutrophils Relative %: 50.3 % (ref 43.0–77.0)
RBC: 4.76 Mil/uL (ref 3.87–5.11)
WBC: 6 10*3/uL (ref 4.5–10.5)

## 2013-06-14 LAB — BASIC METABOLIC PANEL
Calcium: 9.2 mg/dL (ref 8.4–10.5)
Creatinine, Ser: 0.7 mg/dL (ref 0.4–1.2)
Sodium: 139 mEq/L (ref 135–145)

## 2013-06-14 NOTE — Progress Notes (Signed)
ELECTROPHYSIOLOGY OFFICE NOTE  Patient ID: [name] MRN: [id number], DOB/AGE: 1965-10-23   Date of Visit: 06/14/2013  Primary Physician: Syliva Overman, MD Primary Cardiologist: Graciela Husbands, MD Reason for Visit: EP/device follow-up  History of Present Illness  [name] is a pleasant 47 year old woman with an ICM s/p ICD implant with h/o appropriate therapy for VF, chronic systolic HF and CAD with prior inferior wall MI who presents today for routine electrophysiology followup. Her ICD battery is at [facility].   Since last being seen in our clinic, she reports she is doing well. She denies CP ro SOB. She denies dizziness, near syncope or syncope. She denies LE swelling, orthopnea, PND or recent weight gain. [name] reports compliance with medications but salts her food daily.  Past Medical History Past Medical History  Diagnosis Date  . coronary artery disease     RCA stenting Myoview 2011 EF 30% infarction dilatation without ischemia  . CHF (congestive heart failure), NYHA class III   . Automatic implantable cardiac defibrillator in situ     Medtronic  . Other and unspecified hyperlipidemia   . Cardiomyopathy secondary     Patient has cardiomyopathy out of proportion to her ischemic heart disease  . Depression   . GERD (gastroesophageal reflux disease)   . Nicotine abuse   . Insomnia   . Migraines   . Lupus (systemic lupus erythematosus)   . Arthritis of knee   . Myocardial infarction     8 total   . Coronary artery disease   . Diabetes mellitus     Past Surgical History Past Surgical History  Procedure Laterality Date  . Cholecystectomy    . Abdominal hysterectomy    . Rotator cuff repair  2002  . Defibrillator placed   2009  . Colonoscopy  07/15/2011    SLF: internal hemorrhoids/hyperplastic polyps in the rectum/tubular adenomaSURVEILLANCE Nov 2017    Allergies/Intolerances Allergies  Allergen Reactions  . Bee Venom Anaphylaxis  . Cortisone Swelling      Current Home Medications Current Outpatient Prescriptions  Medication Sig Dispense Refill  . ALPRAZolam (XANAX) 0.25 MG tablet One tablet at bedtime for anxiety and sleep  30 tablet  3  . Aspirin-Acetaminophen (GOODYS BODY PAIN PO) Take 1 Package by mouth daily as needed. For pain      . carvedilol (COREG) 3.125 MG tablet Take 1 tablet (3.125 mg total) by mouth 2 (two) times daily.  60 tablet  12  . fenofibrate (TRICOR) 145 MG tablet Take 1 tablet (145 mg total) by mouth daily.  30 tablet  4  . lisinopril (PRINIVIL,ZESTRIL) 2.5 MG tablet Take 1 tablet (2.5 mg total) by mouth daily.  90 tablet  3  . metFORMIN (GLUCOPHAGE XR) 500 MG 24 hr tablet Take 1 tablet (500 mg total) by mouth daily with breakfast.  30 tablet  11  . rosuvastatin (CRESTOR) 40 MG tablet Take 1 tablet (40 mg total) by mouth daily.  30 tablet  5   No current facility-administered medications for this visit.    Social History Social History  . Marital Status: Single   Social History Main Topics  . Smoking status: Current Every Day Smoker -- 0.50 packs/day for 7 years    Types: Cigarettes  . Smokeless tobacco: No  . Alcohol Use: Yes     Comment: occasionaly  . Drug Use: No   Review of Systems General: No chills, fever, night sweats or weight changes Cardiovascular: No chest pain, dyspnea on  exertion, edema, orthopnea, palpitations, paroxysmal nocturnal dyspnea Dermatological: No rash, lesions or masses Respiratory: No cough, dyspnea Urologic: No hematuria, dysuria Abdominal: No nausea, vomiting, diarrhea, bright red blood per rectum, melena, or hematemesis Neurologic: No visual changes, weakness, changes in mental status All other systems reviewed and are otherwise negative except as noted above.  Physical Exam Vitals: Blood pressure 110/80, pulse 64, height 5\' 6"  (1.676 m), weight 215 lb 12.8 oz (97.886 kg).  General: Well developed, well appearing 47 y.o. female in no acute distress. HEENT: Normocephalic,  atraumatic. EOMs intact. Sclera nonicteric. Oropharynx clear.  Neck: Supple. No JVD. Lungs: Respirations regular and unlabored, CTA bilaterally. No wheezes, rales or rhonchi. Heart: RRR. S1, S2 present. No murmurs, rub, S3 or S4. Abdomen: Soft, non-tender, non-distended. BS present x 4 quadrants. No hepatosplenomegaly.  Extremities: No clubbing, cyanosis or edema. DP/PT/Radials 2+ and equal bilaterally. Psych: Normal affect. Neuro: Alert and oriented X 3. Moves all extremities spontaneously. Skin: Left upper chest / ICD implant site well healed.   Diagnostics Device interrogation - recent on 05/30/2013 - battery at [facility] since 05/28/2013; 6949 lead on recall; V pacing 0%; histograms appropriate  Assessment and Plan 1. ICM s/p ICD implant (initially for primary prevention SCD with subsequent appropriate therapy for VF)  2. Paroxysmal VT 3. Chronic systolic HF  4. CAD   [name] presents for EP follow-up. Her ICD battery is at [facility]. She also has 6949 RV lead on recall. Discussed the need for ICD generator change and RV lead revision. Risks, benefits and alternatives to ICD generator change and RV lead revision were discussed in detail with her today. These risks include, but are not limited to, bleeding, infection, pneumothorax, perforation, tamponade, vascular damage, renal failure, MI, lead dislodgement, stroke and death. [name] expressed verbal understanding and wishes to proceed. This will be scheduled with Dr. Graciela Husbands at the next available time. She was counseled regarding salt restriction and tobacco cessation.  Signed, Rick Duff, PA-C 06/14/2013, 10:38 AM

## 2013-06-14 NOTE — Patient Instructions (Addendum)
Your physician has recommended that you have a defibrillator inserted with RV lead revision ON June 17, 2013 AT 10:00 A.M. ARRIVE AT 8:00 A.M . An implantable cardioverter defibrillator (ICD) is a small device that is placed in your chest or, in rare cases, your abdomen. This device uses electrical pulses or shocks to help control life-threatening, irregular heartbeats that could lead the heart to suddenly stop beating (sudden cardiac arrest). Leads are attached to the ICD that goes into your heart. This is done in the hospital and usually requires an overnight stay. Please see the instruction sheet given to you today for more information.  Your physician recommends that you return for lab work in: TODAY [facility])  Your physician has recommended you make the following change in your medication:   HOLD YOUR METFORMIN MORNING OF PROCEDURE (06-17-2013)  Your physician recommends that you continue on your current medications as directed. Please refer to the Current Medication list given to you today.

## 2013-06-14 NOTE — Addendum Note (Signed)
Addended by: Minda Meo on: 06/14/2013 11:26 PM   Modules accepted: Orders

## 2013-06-15 ENCOUNTER — Encounter (HOSPITAL_COMMUNITY): Payer: Self-pay | Admitting: Pharmacist

## 2013-06-16 DIAGNOSIS — I252 Old myocardial infarction: Secondary | ICD-10-CM | POA: Diagnosis not present

## 2013-06-16 DIAGNOSIS — E119 Type 2 diabetes mellitus without complications: Secondary | ICD-10-CM | POA: Diagnosis not present

## 2013-06-16 DIAGNOSIS — I5022 Chronic systolic (congestive) heart failure: Secondary | ICD-10-CM | POA: Diagnosis not present

## 2013-06-16 DIAGNOSIS — I2589 Other forms of chronic ischemic heart disease: Secondary | ICD-10-CM | POA: Diagnosis not present

## 2013-06-16 DIAGNOSIS — I509 Heart failure, unspecified: Secondary | ICD-10-CM | POA: Diagnosis not present

## 2013-06-16 DIAGNOSIS — Z4502 Encounter for adjustment and management of automatic implantable cardiac defibrillator: Secondary | ICD-10-CM | POA: Diagnosis not present

## 2013-06-16 DIAGNOSIS — Z79899 Other long term (current) drug therapy: Secondary | ICD-10-CM | POA: Diagnosis not present

## 2013-06-16 DIAGNOSIS — M329 Systemic lupus erythematosus, unspecified: Secondary | ICD-10-CM | POA: Diagnosis not present

## 2013-06-16 MED ORDER — CEFAZOLIN SODIUM-DEXTROSE 2-3 GM-% IV SOLR: 2.0000 g | INTRAVENOUS | Status: DC

## 2013-06-16 MED ORDER — SODIUM CHLORIDE 0.9 % IR SOLN
80.0000 mg | Status: DC
  Filled 2013-06-16: qty 2

## 2013-06-17 ENCOUNTER — Encounter (HOSPITAL_COMMUNITY): Payer: Self-pay | Admitting: General Practice

## 2013-06-17 ENCOUNTER — Ambulatory Visit (HOSPITAL_COMMUNITY)
Admission: RE | Admit: 2013-06-17 | Discharge: 2013-06-18 | Disposition: A | Discharge status: Home / Self Care | Payer: PRIVATE HEALTH INSURANCE | Source: Ambulatory Visit | Attending: Internal Medicine | Admitting: Internal Medicine

## 2013-06-17 ENCOUNTER — Encounter (HOSPITAL_COMMUNITY)
Admission: RE | Disposition: A | Discharge status: Home / Self Care | Payer: Self-pay | Source: Ambulatory Visit | Attending: Internal Medicine

## 2013-06-17 ENCOUNTER — Encounter: Payer: Self-pay | Admitting: Internal Medicine

## 2013-06-17 DIAGNOSIS — T82198A Other mechanical complication of other cardiac electronic device, initial encounter: Secondary | ICD-10-CM

## 2013-06-17 DIAGNOSIS — Z4502 Encounter for adjustment and management of automatic implantable cardiac defibrillator: Secondary | ICD-10-CM

## 2013-06-17 DIAGNOSIS — I2589 Other forms of chronic ischemic heart disease: Secondary | ICD-10-CM | POA: Insufficient documentation

## 2013-06-17 DIAGNOSIS — Z95810 Presence of automatic (implantable) cardiac defibrillator: Secondary | ICD-10-CM

## 2013-06-17 DIAGNOSIS — R519 Headache, unspecified: Secondary | ICD-10-CM

## 2013-06-17 DIAGNOSIS — E119 Type 2 diabetes mellitus without complications: Secondary | ICD-10-CM | POA: Insufficient documentation

## 2013-06-17 DIAGNOSIS — M329 Systemic lupus erythematosus, unspecified: Secondary | ICD-10-CM | POA: Insufficient documentation

## 2013-06-17 DIAGNOSIS — I472 Ventricular tachycardia: Secondary | ICD-10-CM

## 2013-06-17 DIAGNOSIS — I5022 Chronic systolic (congestive) heart failure: Secondary | ICD-10-CM

## 2013-06-17 DIAGNOSIS — I509 Heart failure, unspecified: Secondary | ICD-10-CM | POA: Insufficient documentation

## 2013-06-17 DIAGNOSIS — Z79899 Other long term (current) drug therapy: Secondary | ICD-10-CM | POA: Insufficient documentation

## 2013-06-17 DIAGNOSIS — T82198D Other mechanical complication of other cardiac electronic device, subsequent encounter: Secondary | ICD-10-CM

## 2013-06-17 DIAGNOSIS — I252 Old myocardial infarction: Secondary | ICD-10-CM | POA: Insufficient documentation

## 2013-06-17 DIAGNOSIS — I255 Ischemic cardiomyopathy: Secondary | ICD-10-CM

## 2013-06-17 DIAGNOSIS — I059 Rheumatic mitral valve disease, unspecified: Secondary | ICD-10-CM

## 2013-06-17 HISTORY — PX: ICD GENERATOR CHANGE: SHX5854

## 2013-06-17 HISTORY — DX: Unspecified systolic (congestive) heart failure: I50.20

## 2013-06-17 HISTORY — PX: IMPLANTABLE CARDIOVERTER DEFIBRILLATOR (ICD) GENERATOR CHANGE: SHX5469

## 2013-06-17 HISTORY — DX: Ventricular tachycardia, unspecified: I47.20

## 2013-06-17 HISTORY — DX: Other ventricular tachycardia: I47.29

## 2013-06-17 HISTORY — PX: LEAD REVISION: SHX5945

## 2013-06-17 HISTORY — DX: Ventricular fibrillation: I49.01

## 2013-06-17 HISTORY — DX: Ventricular tachycardia: I47.2

## 2013-06-17 LAB — SURGICAL PCR SCREEN
MRSA, PCR: NEGATIVE
Staphylococcus aureus: NEGATIVE

## 2013-06-17 SURGERY — LEAD REVISION
Anesthesia: LOCAL

## 2013-06-17 MED ORDER — LIDOCAINE HCL (PF) 1 % IJ SOLN
INTRAMUSCULAR | Status: AC
  Filled 2013-06-17: qty 60

## 2013-06-17 MED ORDER — HEPARIN (PORCINE) IN NACL 2-0.9 UNIT/ML-% IJ SOLN
INTRAMUSCULAR | Status: AC
  Filled 2013-06-17: qty 500

## 2013-06-17 MED ORDER — MIDAZOLAM HCL 5 MG/5ML IJ SOLN
INTRAMUSCULAR | Status: AC
  Filled 2013-06-17: qty 5

## 2013-06-17 MED ORDER — SODIUM CHLORIDE 0.9 % IV SOLN: INTRAVENOUS | Status: DC

## 2013-06-17 MED ORDER — CARVEDILOL 3.125 MG PO TABS
3.1250 mg | ORAL_TABLET | Freq: Two times a day (BID) | ORAL | Status: DC
  Administered 2013-06-17 – 2013-06-18 (×3): 3.125 mg via ORAL
  Filled 2013-06-17 (×3): qty 1

## 2013-06-17 MED ORDER — FENTANYL CITRATE 0.05 MG/ML IJ SOLN
INTRAMUSCULAR | Status: AC
  Filled 2013-06-17: qty 2

## 2013-06-17 MED ORDER — ACETAMINOPHEN 325 MG PO TABS
ORAL_TABLET | ORAL | Status: AC
  Filled 2013-06-17: qty 2

## 2013-06-17 MED ORDER — MUPIROCIN 2 % EX OINT
TOPICAL_OINTMENT | Freq: Two times a day (BID) | CUTANEOUS | Status: DC
  Administered 2013-06-17: 1 via NASAL
  Filled 2013-06-17: qty 22

## 2013-06-17 MED ORDER — SODIUM CHLORIDE 0.9 % IV SOLN: INTRAVENOUS | Status: AC

## 2013-06-17 MED ORDER — ONDANSETRON HCL 4 MG/2ML IJ SOLN
INTRAMUSCULAR | Status: AC
  Administered 2013-06-17: 4 mg via INTRAVENOUS
  Filled 2013-06-17: qty 2

## 2013-06-17 MED ORDER — OXYCODONE HCL 5 MG PO TABS
30.0000 mg | ORAL_TABLET | Freq: Three times a day (TID) | ORAL | Status: DC
  Administered 2013-06-17 – 2013-06-18 (×3): 30 mg via ORAL
  Filled 2013-06-17 (×3): qty 6

## 2013-06-17 MED ORDER — MUPIROCIN 2 % EX OINT
TOPICAL_OINTMENT | CUTANEOUS | Status: AC
  Filled 2013-06-17: qty 22

## 2013-06-17 MED ORDER — CHLORHEXIDINE GLUCONATE 4 % EX LIQD
60.0000 mL | Freq: Once | CUTANEOUS | Status: DC
  Filled 2013-06-17: qty 60

## 2013-06-17 MED ORDER — LISINOPRIL 2.5 MG PO TABS
2.5000 mg | ORAL_TABLET | Freq: Every day | ORAL | Status: DC
  Administered 2013-06-17 – 2013-06-18 (×2): 2.5 mg via ORAL
  Filled 2013-06-17 (×2): qty 1

## 2013-06-17 MED ORDER — ACETAMINOPHEN 325 MG PO TABS
325.0000 mg | ORAL_TABLET | ORAL | Status: DC | PRN
  Administered 2013-06-17: 650 mg via ORAL

## 2013-06-17 MED ORDER — MORPHINE SULFATE 2 MG/ML IJ SOLN: 2.0000 mg | Freq: Once | INTRAMUSCULAR | Status: DC

## 2013-06-17 MED ORDER — ATORVASTATIN CALCIUM 80 MG PO TABS
80.0000 mg | ORAL_TABLET | Freq: Every day | ORAL | Status: DC
  Administered 2013-06-17: 17:00:00 80 mg via ORAL
  Filled 2013-06-17 (×2): qty 1

## 2013-06-17 MED ORDER — ONDANSETRON HCL 4 MG/2ML IJ SOLN
4.0000 mg | Freq: Four times a day (QID) | INTRAMUSCULAR | Status: DC | PRN
  Administered 2013-06-17: 4 mg via INTRAVENOUS
  Filled 2013-06-17: qty 2

## 2013-06-17 MED ORDER — ALPRAZOLAM 0.25 MG PO TABS: 0.2500 mg | ORAL_TABLET | Freq: Every evening | ORAL | Status: DC | PRN

## 2013-06-17 MED ORDER — LIDOCAINE HCL (PF) 1 % IJ SOLN
INTRAMUSCULAR | Status: AC
  Filled 2013-06-17: qty 30

## 2013-06-17 MED ORDER — FUROSEMIDE 10 MG/ML IJ SOLN
INTRAMUSCULAR | Status: AC
  Filled 2013-06-17: qty 4

## 2013-06-17 MED ORDER — CEFAZOLIN SODIUM 1-5 GM-% IV SOLN
1.0000 g | Freq: Four times a day (QID) | INTRAVENOUS | Status: AC
  Administered 2013-06-17 – 2013-06-18 (×3): 1 g via INTRAVENOUS
  Filled 2013-06-17 (×3): qty 50

## 2013-06-17 MED ORDER — METFORMIN HCL ER 500 MG PO TB24
500.0000 mg | ORAL_TABLET | Freq: Every day | ORAL | Status: DC
  Filled 2013-06-17 (×2): qty 1

## 2013-06-17 NOTE — CV Procedure (Signed)
Preoperative diagnosis  Ischemic cardiomyopahthy previous device  At [facility], Previous device on advisory recall Postoperative diagnosis same/   Procedure: Generator explant new lead insertion new device implantPOCKET REVISION VENOGRAML  Following informed consent the patient was brought to the electrophysiology laboratory in place of the fluoroscopic table in the supine position.  vENOGRAM DEMONSTRATED PATENCY OF THE EXTRATHORACIC SUBCLAVIAN VEIN after routine prep and drape lidocaine was infiltrated in the region of the previous incision and carried down to later the device pocket using sharp dissection and electrocautery. The pocket was opened the device was freed up and was explanted.   The prior lead was capped.  We attention to gain access to the extrathoracic left subclavian vein which was accomplished with mild difficulty but without the aspiration of air or puncture of the artery. A wire was placed a 9 French sheath was inserted and a Medtronic 69 10/20/1960 centimeter active-fixation lead serial number [id number] was inserted to the distal right ventricular apex where it was secured. Over time however, the R-wave amplitudes decreased from the 8 mV--2 mV range. At this point we had begun to close the pocket and we opened up the pocket and we positioned the RV lead. In its final location the amplitude was 9.4 mV with an impedance of 807 a threshold of 1.4 at 0.5 with a brisk current of injury and no diaphragmatic stimulation. High voltage impedance was 72 ohms. The lead was then attached to a Medtronic defibrillator.[id number]. For the device the amplitude was 6.4 with an impedance of 703 a threshold of 1.2 V at 0.4 ms. The device was implanted. The pocket was copiously irrigated with antibiotic containing saline solution hemostasis was assured. Surgicel was placed at the cephalad aspect of the pocket. The wound is closed in 3 layers in the normal fashion.  The patient tolerated the procedure. She  suffered from some nausea that was treated with Zofran. She also had complains intermittently of discomfort in her shoulder.     No DFT testing was performed  I should note also that the pocket was revised to move the device immediately  Sherryl Manges   \

## 2013-06-17 NOTE — Progress Notes (Signed)
  Echocardiogram 2D Echocardiogram limited has been performed.  Neil Errickson FRANCES 06/17/2013, 4:52 PM

## 2013-06-17 NOTE — H&P (View-Only) (Signed)
 ELECTROPHYSIOLOGY OFFICE NOTE  Patient ID: [name] MRN: [id number], DOB/AGE: 02/14/1966   Date of Visit: 06/14/2013  Primary Physician: Margaret Simpson, MD Primary Cardiologist: Klein, MD Reason for Visit: EP/device follow-up  History of Present Illness  [name] is a pleasant 46 year old woman with an ICM s/p ICD implant with h/o appropriate therapy for VF, chronic systolic HF and CAD with prior inferior wall MI who presents today for routine electrophysiology followup. [facility] is at [facility].   Since last being seen in our clinic, she reports she is doing well. She denies CP ro SOB. She denies dizziness, near syncope or syncope. She denies LE swelling, orthopnea, PND or recent weight gain. [name] reports compliance with medications but salts her food daily.  Past Medical History Past Medical History  Diagnosis Date  . coronary artery disease     RCA stenting Myoview 2011 EF 30% infarction dilatation without ischemia  . CHF (congestive heart failure), NYHA class III   . Automatic implantable cardiac defibrillator in situ     Medtronic  . Other and unspecified hyperlipidemia   . Cardiomyopathy secondary     Patient has cardiomyopathy out of proportion to her ischemic heart disease  . Depression   . GERD (gastroesophageal reflux disease)   . Nicotine abuse   . Insomnia   . Migraines   . Lupus (systemic lupus erythematosus)   . Arthritis of knee   . Myocardial infarction     8 total   . Coronary artery disease   . Diabetes mellitus     Past Surgical History Past Surgical History  Procedure Laterality Date  . Cholecystectomy    . Abdominal hysterectomy    . Rotator cuff repair  2002  . Defibrillator placed   2009  . Colonoscopy  07/15/2011    SLF: internal hemorrhoids/hyperplastic polyps in the rectum/tubular adenomaSURVEILLANCE Nov 2017    Allergies/Intolerances Allergies  Allergen Reactions  . Bee Venom Anaphylaxis  . Cortisone Swelling      Current Home Medications Current Outpatient Prescriptions  Medication Sig Dispense Refill  . ALPRAZolam (XANAX) 0.25 MG tablet One tablet at bedtime for anxiety and sleep  30 tablet  3  . Aspirin-Acetaminophen (GOODYS BODY PAIN PO) Take 1 Package by mouth daily as needed. For pain      . carvedilol (COREG) 3.125 MG tablet Take 1 tablet (3.125 mg total) by mouth 2 (two) times daily.  60 tablet  12  . fenofibrate (TRICOR) 145 MG tablet Take 1 tablet (145 mg total) by mouth daily.  30 tablet  4  . lisinopril (PRINIVIL,ZESTRIL) 2.5 MG tablet Take 1 tablet (2.5 mg total) by mouth daily.  90 tablet  3  . metFORMIN (GLUCOPHAGE XR) 500 MG 24 hr tablet Take 1 tablet (500 mg total) by mouth daily with breakfast.  30 tablet  11  . rosuvastatin (CRESTOR) 40 MG tablet Take 1 tablet (40 mg total) by mouth daily.  30 tablet  5   No current facility-administered medications for this visit.    Social History Social History  . Marital Status: Single   Social History Main Topics  . Smoking status: Current Every Day Smoker -- 0.50 packs/day for 7 years    Types: Cigarettes  . Smokeless tobacco: No  . Alcohol Use: Yes     Comment: occasionaly  . Drug Use: No   Review of Systems General: No chills, fever, night sweats or weight changes Cardiovascular: No chest pain, dyspnea on   exertion, edema, orthopnea, palpitations, paroxysmal nocturnal dyspnea Dermatological: No rash, lesions or masses Respiratory: No cough, dyspnea Urologic: No hematuria, dysuria Abdominal: No nausea, vomiting, diarrhea, bright red blood per rectum, melena, or hematemesis Neurologic: No visual changes, weakness, changes in mental status All other systems reviewed and are otherwise negative except as noted above.  Physical Exam Vitals: Blood pressure 110/80, pulse 64, height 5' 6" (1.676 m), weight 215 lb 12.8 oz (97.886 kg).  General: Well developed, well appearing 47 y.o. female in no acute distress. HEENT: Normocephalic,  atraumatic. EOMs intact. Sclera nonicteric. Oropharynx clear.  Neck: Supple. No JVD. Lungs: Respirations regular and unlabored, CTA bilaterally. No wheezes, rales or rhonchi. Heart: RRR. S1, S2 present. No murmurs, rub, S3 or S4. Abdomen: Soft, non-tender, non-distended. BS present x 4 quadrants. No hepatosplenomegaly.  Extremities: No clubbing, cyanosis or edema. DP/PT/Radials 2+ and equal bilaterally. Psych: Normal affect. Neuro: Alert and oriented X 3. Moves all extremities spontaneously. Skin: Left upper chest / ICD implant site well healed.   Diagnostics Device interrogation - recent on 05/30/2013 - battery at [facility] since 05/28/2013; 6949 lead on recall; V pacing 0%; histograms appropriate  Assessment and Plan 1. ICM s/p ICD implant (initially for primary prevention SCD with subsequent appropriate therapy for VF)  2. Paroxysmal VT 3. Chronic systolic HF  4. CAD   [name] presents for EP follow-up. Her ICD battery is at [facility]. She also has 6949 RV lead on recall. Discussed the need for ICD generator change and RV lead revision. Risks, benefits and alternatives to ICD generator change and RV lead revision were discussed in detail with her today. These risks include, but are not limited to, bleeding, infection, pneumothorax, perforation, tamponade, vascular damage, renal failure, MI, lead dislodgement, stroke and death. [name] expressed verbal understanding and wishes to proceed. This will be scheduled with Dr. Klein at the next available time. She was counseled regarding salt restriction and tobacco cessation.  Signed, Ignacio Lowder, PA-C 06/14/2013, 10:38 AM   

## 2013-06-17 NOTE — Interval H&P Note (Signed)
History and Physical Interval Note:  06/17/2013 9:27 AM  [name]  has presented today for surgery, with the diagnosis of asm  The various methods of treatment have been discussed with the patient and family. After consideration of risks, benefits and other options for treatment, the patient has consented to  Procedure(s): LEAD REVISION (N/A) ICD GENERATOR CHANGE (Left) as a surgical intervention .  The patient's history has been reviewed, patient examined, no change in status, stable for surgery.  I have reviewed the patient's chart and labs.  Questions were answered to the patient's satisfaction.   \ICD Criteria  Current LVEF (within 6 months):n/a%  NYHA Functional Classification: Class II  Heart Failure History:  Yes, Duration of heart failure since onset is > 9 months  Non-Ischemic Dilated Cardiomyopathy History:  No.  Atrial Fibrillation/Atrial Flutter:  No.  Ventricular Tachycardia History:  Yes, Hemodynamic instability present, VT Type:  SVT - Polymorphic.  Cardiac Arrest History:  Yes, This was a Ventricular Tachycardia/Ventricular Fibrillation Arrest. This was NOT a bradycardia arrest.  History of Syndromes with Risk of Sudden Death:  No.  Previous ICD:  Yes, ICD Type:  Single, Reason for ICD:  Primary prevention.  30%  Electrophysiology Study: No.  Anticoagulation Therapy:  Patient is NOT on anticoagulation therapy.   Beta Blocker Therapy:  Yes.   Ace Inhibitor/ARB Therapy:  Yes.  Sherryl Manges

## 2013-06-18 ENCOUNTER — Ambulatory Visit (HOSPITAL_COMMUNITY): Payer: PRIVATE HEALTH INSURANCE

## 2013-06-18 ENCOUNTER — Encounter (HOSPITAL_COMMUNITY): Payer: Self-pay | Admitting: Physician Assistant

## 2013-06-18 DIAGNOSIS — Z4502 Encounter for adjustment and management of automatic implantable cardiac defibrillator: Secondary | ICD-10-CM | POA: Diagnosis not present

## 2013-06-18 DIAGNOSIS — I5022 Chronic systolic (congestive) heart failure: Secondary | ICD-10-CM

## 2013-06-18 DIAGNOSIS — R51 Headache: Secondary | ICD-10-CM

## 2013-06-18 LAB — GLUCOSE, CAPILLARY

## 2013-06-18 MED ORDER — ASPIRIN EC 81 MG PO TBEC: 81.0000 mg | DELAYED_RELEASE_TABLET | Freq: Every day | ORAL | Status: DC

## 2013-06-18 MED ORDER — METFORMIN HCL ER 500 MG PO TB24: 500.0000 mg | ORAL_TABLET | Freq: Every day | ORAL | Status: DC

## 2013-06-18 MED ORDER — ASPIRIN-ACETAMINOPHEN-CAFFEINE 250-250-65 MG PO TABS
2.0000 | ORAL_TABLET | Freq: Four times a day (QID) | ORAL | Status: DC | PRN
  Administered 2013-06-18: 09:00:00 2 via ORAL
  Filled 2013-06-18: qty 2

## 2013-06-18 NOTE — Discharge Summary (Signed)
Discharge Summary   Patient ID: [name] MRN: [id number], DOB/AGE: 1966-01-01 47 y.o. Admit date: 06/17/2013 D/C date:     06/18/2013  Primary Cardiologist: Graciela Husbands  Primary Discharge Diagnoses:  1. ICM/chronic systolic CHF s/p ICD at [facility] (prior [id number] lead as well) - s/p Medtronic generator change, new lead insertion, pocket revision, venogram 06/17/13 2. H/o appropriate therapy for VF 3. H/o paroxysmal VT  Secondary Discharge Diagnoses:  Past Medical History  Diagnosis Date  . coronary artery disease     RCA stenting Myoview 2011 EF 30% infarction dilatation without ischemia  . Other and unspecified hyperlipidemia   . Cardiomyopathy secondary     Patient has cardiomyopathy out of proportion to her ischemic heart disease  . Depression   . GERD (gastroesophageal reflux disease)   . Nicotine abuse   . Insomnia   . Migraines   . Lupus (systemic lupus erythematosus)   . Arthritis of knee   . Myocardial infarction     8 total   . Diabetes mellitus    Hospital Course: [name] is a 47 y/o F with history of ICM/chronic systolic CHF with history of appropriate therapy for VF, paroxysmal VT, CAD and lupus who presented to [facility]/17/2013 for planned ICD generator change. Her device was recently found to be at [facility]. She also has 6949 RV lead on recall. She was brought into the hopsital yesterday for generator explant, new lead insertion, new device implant,, and pocket revision venogram. 2D echo showed results below without pericardial effusion. The patient tolerated the procedure. This morning she did complain of headache and was treated symptomatically. No focal neurologic disturbances were observed. She remains on her scheduled chronic oxycodone. CXR was without significant complication. Dr. Tenny Craw has seen and examined the patient today and feels she is stable for discharge. I have left a message on our office's scheduling voicemail requesting a wound check &  follow-up appointment, and our office will call the patient with this appointment.  I have also sent a message to our EP team with update about adding daily low dose ASA as well as inquiring when patient might be cleared to drive based on her last ICD interrogation. (Regarding aspirin, pt denies significant history of prior bleeding and is not aware of any contraindications. She takes [place] powder about 1x/week.) I have asked that they call the patient with this info. She was instructed not to drive until cleared by Dr. Graciela Husbands.  Discharge Vitals: Blood pressure 119/69, pulse 92, temperature 99 F (37.2 C), temperature source Oral, resp. rate 18, height 5\' 6"  (1.676 m), weight 214 lb 11.7 oz (97.4 kg), SpO2 98.00%.  Labs: Lab Results  Component Value Date   WBC 6.0 06/14/2013   HGB 13.2 06/14/2013   HCT 39.5 06/14/2013   MCV 83.0 06/14/2013   PLT 281.0 06/14/2013     Recent Labs Lab 06/14/13 1107  NA 139  K 3.9  CL 108  CO2 24  BUN 9  CREATININE 0.7  CALCIUM 9.2  GLUCOSE 131*    Lab Results  Component Value Date   CHOL 174 04/14/2013   HDL 27* 04/14/2013   LDLCALC 120* 04/14/2013   TRIG 134 04/14/2013    Diagnostic Studies/Procedures   ICD procedure as above  CXR 06/18/13: IMPRESSION: 1. Left subclavian pacing defibrillator appropriately positioned without acute complicating features. 2. Suboptimal inspiration accounts for mild basilar atelectasis. No acute cardiopulmonary disease otherwise   Discharge Medications     Medication List  ALPRAZolam 0.25 MG tablet  Commonly known as:  XANAX  One tablet at bedtime for anxiety and sleep     aspirin EC 81 MG tablet  Take 1 tablet (81 mg total) by mouth daily.     carvedilol 3.125 MG tablet  Commonly known as:  COREG  Take 1 tablet (3.125 mg total) by mouth 2 (two) times daily.     fenofibrate 145 MG tablet  Commonly known as:  TRICOR  Take 1 tablet (145 mg total) by mouth daily.     GOODYS BODY PAIN PO    Take 1 Package by mouth daily as needed. For pain     lisinopril 2.5 MG tablet  Commonly known as:  PRINIVIL,ZESTRIL  Take 1 tablet (2.5 mg total) by mouth daily.     metFORMIN 500 MG 24 hr tablet  Commonly known as:  GLUCOPHAGE XR  Take 1 tablet (500 mg total) by mouth daily with breakfast.  Start taking on:  06/20/2013     oxyCODONE 15 MG immediate release tablet  Commonly known as:  ROXICODONE  Take 30 mg by mouth every 8 (eight) hours.     rosuvastatin 40 MG tablet  Commonly known as:  CRESTOR  Take 40 mg by mouth at bedtime.        Disposition   The patient will be discharged in stable condition to home. Discharge Orders   Future Appointments Provider Department Dept Phone   07/11/2013 3:30 PM Kerri Perches, MD [facility] [phone]   Future Orders Complete By Expires   Diet - low sodium heart healthy  As directed    Discharge instructions  As directed    Comments:     Please see attached sheet at the end of your After-Visit Summary for instructions on wound care, activity, and bathing.  Do not restart Metformin until 06/20/13.   Increase activity slowly  As directed      Follow-up Information   Follow up with Sherryl Manges, MD. (Our office will call you for a follow-up appointment. Please call the office if you have not heard from [place] within 3 days.)    Specialty:  Cardiology   Contact information:   [address] [place] [address] [phone]         Duration of Discharge Encounter: Greater than 30 minutes including physician and PA time.  Signed, Elvert Cumpton PA-C 06/18/2013, 11:09 AM

## 2013-06-18 NOTE — Progress Notes (Signed)
Subjective: Breathing OK  No CP   Severe HA (frontal)  No visual changes.   Objective: Filed Vitals:   06/17/13 1700 06/17/13 2000 06/18/13 0000 06/18/13 0458  BP: 102/59 117/73 109/78 123/64  Pulse:  84 80 102  Temp:  97.8 F (36.6 C) 98.1 F (36.7 C) 99.4 F (37.4 C)  TempSrc:  Oral Oral Oral  Resp:  20 18 20   Height:      Weight:    214 lb 11.7 oz (97.4 kg)  SpO2:  98% 96% 98%   Weight change:   Intake/Output Summary (Last 24 hours) at 06/18/13 0719 Last data filed at 06/18/13 0500  Gross per 24 hour  Intake 965.83 ml  Output   1200 ml  Net -234.17 ml    General: Alert, awake, oriented x3, in no acute distress Neck:  JVP is normal Heart: Regular rate and rhythm, without murmurs, rubs, gallops.  Lungs: Clear to auscultation.  No rales or wheezes. Chest:  Dressing dry.   Exemities:  No edema.   Neuro: Grossly intact, nonfocal.  Tele:  SR  Lab Results: Results for orders placed during the hospital encounter of 06/17/13 (from the past 24 hour(s))  SURGICAL PCR SCREEN     Status: None   Collection Time    06/17/13  9:59 AM      Result Value Range   MRSA, PCR NEGATIVE  NEGATIVE   Staphylococcus aureus NEGATIVE  NEGATIVE  GLUCOSE, CAPILLARY     Status: Abnormal   Collection Time    06/17/13  5:48 PM      Result Value Range   Glucose-Capillary 144 (*) 70 - 99 mg/dL   Comment 1 Notify RN      Studies/Results: @RISRSLT24 @  CXR  Pending  Medications: Reviewed   @PROBHOSP @  1.  EP  Patient is s/p explant, lead insertion and new device implant with pocket revision.  CXR pending  2.  Chronic systolic CHF  Volume status looks OK  3  HA  Patient with frontal HA  No other complaint  Will try ASA/Tyl Caffeine  Has helped in past.    4.  LUpus  Followed in [place]  Takes Oxycodone for jts   LOS: 1 day   Dietrich Pates 06/18/2013, 7:19 AM

## 2013-06-18 NOTE — Progress Notes (Signed)
Patient notified that we would be closing the unit and transferring her to another unit until she felt better and then she could be discharged. Patient stated she wanted to go home and her daughter was on her way here to take her home. Stated, " I only have a headache". Dayna Dunn notified of conversation with patient and discharge orders received.

## 2013-06-20 ENCOUNTER — Telehealth: Payer: Self-pay | Admitting: *Deleted

## 2013-06-20 LAB — GLUCOSE, CAPILLARY: Glucose-Capillary: 99 mg/dL (ref 70–99)

## 2013-06-20 NOTE — Telephone Encounter (Signed)
Called patient to let her know her school temporary disability paperwork was available for pick up. Also left prescription for Vicodin for 3 days to take every 6 hours as needed for pain. Patient will come by this afternoon to pick up prescription and paperwork.

## 2013-06-22 ENCOUNTER — Telehealth: Payer: Self-pay | Admitting: *Deleted

## 2013-06-22 NOTE — Telephone Encounter (Signed)
Message copied by Baird Lyons on Wed Jun 22, 2013 11:57 AM ------      Message from: Minda Meo      Created: Mon Jun 20, 2013 10:41 AM      Regarding: FW: Appts / Question       Zuzu Befort,      Please see below.       Ms. Newby cannot drive until cleared by Dr. Graciela Husbands.      Thank you,      Brooke            ----- Message -----         From: Laurann Montana, PA-C         Sent: 06/18/2013   8:13 AM           To: Brooke Britt Boozer, PA-C      Subject: Appts / Question                                         I made Ms. Drotar a 10 day wound check appointment then 3 month with Graciela Husbands as usual. If you think there's any reason she needs to be seen sooner, can you help arrange?             Also, two other issues:       - she is not on daily aspirin and I don't see any reason why she couldn't. Added low dose to her home regimen given h/o CAD. She takes [place] powder 1-2x week but nothing that would preclude daily use. No hx bleeding per pt.      - she also has not been told when she is allowed to start driving again (last [place] office notes before yours indicate [place] on interrogation of device).            If someone could call her on Monday with that info, that would be helpful.            Thanks!       ------

## 2013-06-22 NOTE — Telephone Encounter (Signed)
I explained to patient that she could not drive until Dr. Graciela Husbands clears her. She verbalized understanding.

## 2013-06-22 NOTE — Telephone Encounter (Signed)
I explained to patient that she could not drive until cleared by Dr. Graciela Husbands. Patient verbalized understanding.

## 2013-06-23 ENCOUNTER — Other Ambulatory Visit: Payer: Self-pay

## 2013-06-24 ENCOUNTER — Ambulatory Visit (INDEPENDENT_AMBULATORY_CARE_PROVIDER_SITE_OTHER): Payer: PRIVATE HEALTH INSURANCE | Admitting: *Deleted

## 2013-06-24 ENCOUNTER — Telehealth: Payer: Self-pay | Admitting: Internal Medicine

## 2013-06-24 DIAGNOSIS — I255 Ischemic cardiomyopathy: Secondary | ICD-10-CM

## 2013-06-24 DIAGNOSIS — I2589 Other forms of chronic ischemic heart disease: Secondary | ICD-10-CM

## 2013-06-24 DIAGNOSIS — I472 Ventricular tachycardia: Secondary | ICD-10-CM

## 2013-06-24 DIAGNOSIS — I4901 Ventricular fibrillation: Secondary | ICD-10-CM

## 2013-06-24 NOTE — Telephone Encounter (Signed)
Patient's pressure dressing still on since generator change surgery 10/31. I advised patient to come by office and I will remove dressing and assess area. Patient agreeable to plan.

## 2013-06-24 NOTE — Telephone Encounter (Signed)
New message  Patient called due to bandage burning her skin, she feels that she needs to be seen soon. Please advise.

## 2013-06-24 NOTE — Telephone Encounter (Signed)
There was no pressure dressing - it was actually large tegaderm dressing. I removed dressing, site looks good, no redness/drainage, skin glue in place keeping incision closed. Patient advised that she may shower. Patient to see device clinic while here for device check.

## 2013-07-01 ENCOUNTER — Encounter: Payer: Self-pay | Admitting: Internal Medicine

## 2013-07-01 ENCOUNTER — Telehealth: Payer: Self-pay | Admitting: Cardiovascular Disease

## 2013-07-01 LAB — MDC_IDC_ENUM_SESS_TYPE_INCLINIC
HighPow Impedance: 50 Ohm
Lead Channel Impedance Value: 513 Ohm
Lead Channel Pacing Threshold Amplitude: 0.75 V
Lead Channel Pacing Threshold Pulse Width: 0.4 ms
Lead Channel Sensing Intrinsic Amplitude: 5.9 mV
Lead Channel Setting Pacing Pulse Width: 0.4 ms
Lead Channel Setting Sensing Sensitivity: 0.3 mV
Zone Setting Detection Interval: 250 ms

## 2013-07-01 NOTE — Telephone Encounter (Signed)
Patient inquiring about being able to drive. I explained that Dr. Graciela Husbands was out of the office until next week and I would address with him when he returns. Patient agreeable to plan.

## 2013-07-01 NOTE — Progress Notes (Signed)
Wound check appointment.  Wound without redness or edema. Incision edges approximated, wound well healed. Normal device function. Threshold, sensing, and impedances consistent with implant measurements. Device programmed at 3.5V for extra safety margin until 3 month visit. Histogram distribution appropriate for patient and level of activity. No ventricular arrhythmias noted. Patient educated about wound care, arm mobility, lifting restrictions, shock plan. ROV on 09-26-2013 with SK.

## 2013-07-01 NOTE — Telephone Encounter (Signed)
New message  Patient would like for you to give her a call, she wouldn't give me information. She said you knew what it was about.

## 2013-07-05 ENCOUNTER — Encounter: Payer: PRIVATE HEALTH INSURANCE | Admitting: Internal Medicine

## 2013-07-05 ENCOUNTER — Telehealth: Payer: Self-pay | Admitting: Family Medicine

## 2013-07-05 MED ORDER — FLUCONAZOLE 150 MG PO TABS: 150.0000 mg | ORAL_TABLET | Freq: Every day | ORAL | Status: DC

## 2013-07-05 NOTE — Telephone Encounter (Signed)
Please advise. Tried to call for more info but no answer

## 2013-07-05 NOTE — Telephone Encounter (Signed)
pls send in fluconazole 150mg  #2 tabs one daily as needed, and let her know

## 2013-07-05 NOTE — Telephone Encounter (Signed)
Sent in and left message for patient on voicemail

## 2013-07-08 ENCOUNTER — Telehealth: Payer: Self-pay | Admitting: Internal Medicine

## 2013-07-08 NOTE — Telephone Encounter (Signed)
Advised patient she could resume driving. Patient agreeable to plan.

## 2013-07-08 NOTE — Telephone Encounter (Signed)
LMTCB. Called patient to let her know that she is clear to drive per Dr. Graciela Husbands.

## 2013-07-08 NOTE — Telephone Encounter (Signed)
New Problem:  Pt states she is returning Sherri's call.

## 2013-07-08 NOTE — Telephone Encounter (Signed)
Advised patient she could resume driving. Patient agreeable to plan.  

## 2013-07-11 ENCOUNTER — Encounter: Payer: Self-pay | Admitting: Family Medicine

## 2013-07-11 ENCOUNTER — Ambulatory Visit (INDEPENDENT_AMBULATORY_CARE_PROVIDER_SITE_OTHER): Payer: PRIVATE HEALTH INSURANCE | Admitting: Family Medicine

## 2013-07-11 VITALS — BP 106/74 | HR 86 | Resp 18 | Ht 66.0 in | Wt 212.0 lb

## 2013-07-11 DIAGNOSIS — E119 Type 2 diabetes mellitus without complications: Secondary | ICD-10-CM

## 2013-07-11 DIAGNOSIS — N029 Recurrent and persistent hematuria with unspecified morphologic changes: Secondary | ICD-10-CM

## 2013-07-11 DIAGNOSIS — E785 Hyperlipidemia, unspecified: Secondary | ICD-10-CM

## 2013-07-11 DIAGNOSIS — M329 Systemic lupus erythematosus, unspecified: Secondary | ICD-10-CM

## 2013-07-11 DIAGNOSIS — G4700 Insomnia, unspecified: Secondary | ICD-10-CM

## 2013-07-11 DIAGNOSIS — Z23 Encounter for immunization: Secondary | ICD-10-CM

## 2013-07-11 DIAGNOSIS — F17200 Nicotine dependence, unspecified, uncomplicated: Secondary | ICD-10-CM

## 2013-07-11 DIAGNOSIS — E669 Obesity, unspecified: Secondary | ICD-10-CM

## 2013-07-11 DIAGNOSIS — K219 Gastro-esophageal reflux disease without esophagitis: Secondary | ICD-10-CM

## 2013-07-11 DIAGNOSIS — R319 Hematuria, unspecified: Secondary | ICD-10-CM

## 2013-07-11 DIAGNOSIS — F411 Generalized anxiety disorder: Secondary | ICD-10-CM

## 2013-07-11 DIAGNOSIS — I10 Essential (primary) hypertension: Secondary | ICD-10-CM

## 2013-07-11 MED ORDER — FLUCONAZOLE 150 MG PO TABS: ORAL_TABLET | ORAL | Status: AC

## 2013-07-11 NOTE — Progress Notes (Signed)
  Subjective:    Patient ID: [name], female    DOB: 1966-04-01, 47 y.o.   MRN: [id number]  HPI The PT is here for follow up and re-evaluation of chronic medical conditions, medication management and review of any available recent lab and radiology data.  Preventive health is updated, specifically  Cancer screening and Immunization.   Recently had ICD replaced due to lead recall and is doing well The PT denies any adverse reactions to current medications since the last visit.  There are no new concerns. Sleep however continues to be a major problem, medications do not help and she remains sleep deprived most of the time. Still smoking and unwilling to commit to a quit date, counseling done for approximately 4 minutes Needs eye exam, wants to find specialist at [facility] who has seen her in the past. C/o intermittent gross hematuria no associated symptoms of UTI , currently asymptomatic, pt has hysterectomy    Review of Systems See HPI Denies recent fever or chills. Denies sinus pressure, nasal congestion, ear pain or sore throat. Denies chest congestion, productive cough or wheezing. Denies chest pains, palpitations and leg swelling Denies abdominal pain, nausea, vomiting,diarrhea or constipation.   Denies dysuria, frequency, hesitancy or incontinence. Denies uncontrolled  joint pain, swelling and limitation in mobility. Denies headaches, seizures, numbness, or tingling.  Denies skin break down or rash.        Objective:   Physical Exam  Patient alert and oriented and in no cardiopulmonary distress.  HEENT: No facial asymmetry, EOMI, no sinus tenderness,  oropharynx pink and moist.  Neck supple no adenopathy.  Chest: Clear to auscultation bilaterally.  CVS: S1, S2 no murmurs, no S3.  ABD: Soft non tender. Bowel sounds normal.  Ext: No edema  MS: Adequate ROM spine, shoulders, hips and knees.  Skin: Intact, no ulcerations or rash noted.  Psych: Good eye  contact, normal affect. Memory intact not anxious or depressed appearing.  CNS: CN 2-12 intact, power, tone and sensation normal throughout.       Assessment & Plan:

## 2013-07-11 NOTE — Patient Instructions (Addendum)
F/u in 4 month, call if you need me before  Flu vaccine today.  You are referred to urology to evaluate intermittent  Blood in the urine  Fasting lipid, cmp and EGFr, hBa1C , Vit D  This week please, and we will [facility] results [facility]   Please schedule your eye exam this is past due  Continue to work on stopping smoking entirely as well as weight loss  Hope sleep improves, try to reduce anxiety

## 2013-07-16 DIAGNOSIS — N029 Recurrent and persistent hematuria with unspecified morphologic changes: Secondary | ICD-10-CM | POA: Insufficient documentation

## 2013-07-16 NOTE — Assessment & Plan Note (Signed)
Controlled, no change in medication DASH diet and commitment to daily physical activity for a minimum of 30 minutes discussed and encouraged, as a part of hypertension management. The importance of attaining a healthy weight is also discussed.  

## 2013-07-16 NOTE — Assessment & Plan Note (Signed)
Followed at [facility], stable

## 2013-07-16 NOTE — Assessment & Plan Note (Signed)
Unchanged. Patient counseled for approximately 5 minutes regarding the health risks of ongoing nicotine use, specifically all types of cancer, heart disease, stroke and respiratory failure. The options available for help with cessation ,the behavioral changes to assist the process, and the option to either gradully reduce usage  Or abruptly stop.is also discussed. Pt is also encouraged to set specific goals in number of cigarettes used daily, as well as to set a quit date.  

## 2013-07-16 NOTE — Assessment & Plan Note (Addendum)
Improved. Pt applauded on succesful weight loss through lifestyle change, and encouraged to continue same. Weight loss goal set for the next several months.  

## 2013-07-16 NOTE — Assessment & Plan Note (Signed)
H/o intermittent hematuria as though she is "having a period" however pt has hysterectomy. Needs urology eval

## 2013-07-16 NOTE — Assessment & Plan Note (Signed)
Controlled, no change in medication  

## 2013-07-16 NOTE — Assessment & Plan Note (Signed)
Unchanged, continue xanax as before

## 2013-07-16 NOTE — Assessment & Plan Note (Signed)
Deteriorated, unable to sleep despite good sleep hygiene, will use behavior modification only, eventually sleeps when totally exhausted. No medications help

## 2013-07-16 NOTE — Assessment & Plan Note (Signed)
Adequate control Patient educated about the importance of limiting  Carbohydrate intake , the need to commit to daily physical activity for a minimum of 30 minutes , and to commit weight loss. The fact that changes in all these areas will reduce or eliminate all together the development of diabetes is stressed.

## 2013-07-16 NOTE — Assessment & Plan Note (Signed)
Uncontrolled. Hyperlipidemia:Low fat diet discussed and encouraged.  Updated lab next visit 

## 2013-07-18 ENCOUNTER — Telehealth: Payer: Self-pay | Admitting: Internal Medicine

## 2013-07-18 NOTE — Telephone Encounter (Signed)
Patient instructed to return the old transmitter to [employer].

## 2013-07-18 NOTE — Telephone Encounter (Signed)
New Problem:  Pt states she received a new device in the mail and then received a box to return a device. Pt is asking if she needs to return the new device or the old one. Please call pt back.

## 2013-07-19 ENCOUNTER — Emergency Department (HOSPITAL_COMMUNITY): Payer: PRIVATE HEALTH INSURANCE

## 2013-07-19 ENCOUNTER — Encounter (HOSPITAL_COMMUNITY): Payer: Self-pay | Admitting: Emergency Medicine

## 2013-07-19 ENCOUNTER — Emergency Department (HOSPITAL_COMMUNITY)
Admission: EM | Admit: 2013-07-19 | Discharge: 2013-07-19 | Disposition: A | Discharge status: Home / Self Care | Payer: PRIVATE HEALTH INSURANCE | Attending: Emergency Medicine | Admitting: Emergency Medicine

## 2013-07-19 DIAGNOSIS — I252 Old myocardial infarction: Secondary | ICD-10-CM | POA: Insufficient documentation

## 2013-07-19 DIAGNOSIS — Z7982 Long term (current) use of aspirin: Secondary | ICD-10-CM | POA: Insufficient documentation

## 2013-07-19 DIAGNOSIS — I472 Ventricular tachycardia, unspecified: Secondary | ICD-10-CM | POA: Insufficient documentation

## 2013-07-19 DIAGNOSIS — Z95810 Presence of automatic (implantable) cardiac defibrillator: Secondary | ICD-10-CM | POA: Insufficient documentation

## 2013-07-19 DIAGNOSIS — Z8719 Personal history of other diseases of the digestive system: Secondary | ICD-10-CM | POA: Insufficient documentation

## 2013-07-19 DIAGNOSIS — F3289 Other specified depressive episodes: Secondary | ICD-10-CM | POA: Insufficient documentation

## 2013-07-19 DIAGNOSIS — F17200 Nicotine dependence, unspecified, uncomplicated: Secondary | ICD-10-CM | POA: Insufficient documentation

## 2013-07-19 DIAGNOSIS — J019 Acute sinusitis, unspecified: Secondary | ICD-10-CM | POA: Insufficient documentation

## 2013-07-19 DIAGNOSIS — Z79899 Other long term (current) drug therapy: Secondary | ICD-10-CM | POA: Insufficient documentation

## 2013-07-19 DIAGNOSIS — I2510 Atherosclerotic heart disease of native coronary artery without angina pectoris: Secondary | ICD-10-CM | POA: Insufficient documentation

## 2013-07-19 DIAGNOSIS — E119 Type 2 diabetes mellitus without complications: Secondary | ICD-10-CM | POA: Insufficient documentation

## 2013-07-19 DIAGNOSIS — I4901 Ventricular fibrillation: Secondary | ICD-10-CM | POA: Insufficient documentation

## 2013-07-19 DIAGNOSIS — F329 Major depressive disorder, single episode, unspecified: Secondary | ICD-10-CM | POA: Insufficient documentation

## 2013-07-19 DIAGNOSIS — M1710 Unilateral primary osteoarthritis, unspecified knee: Secondary | ICD-10-CM | POA: Insufficient documentation

## 2013-07-19 DIAGNOSIS — E785 Hyperlipidemia, unspecified: Secondary | ICD-10-CM | POA: Insufficient documentation

## 2013-07-19 DIAGNOSIS — G43909 Migraine, unspecified, not intractable, without status migrainosus: Secondary | ICD-10-CM | POA: Insufficient documentation

## 2013-07-19 DIAGNOSIS — J209 Acute bronchitis, unspecified: Secondary | ICD-10-CM

## 2013-07-19 DIAGNOSIS — I5020 Unspecified systolic (congestive) heart failure: Secondary | ICD-10-CM | POA: Insufficient documentation

## 2013-07-19 DIAGNOSIS — I4729 Other ventricular tachycardia: Secondary | ICD-10-CM | POA: Insufficient documentation

## 2013-07-19 MED ORDER — IPRATROPIUM BROMIDE 0.02 % IN SOLN
0.5000 mg | Freq: Once | RESPIRATORY_TRACT | Status: AC
  Administered 2013-07-19: 0.5 mg via RESPIRATORY_TRACT
  Filled 2013-07-19: qty 2.5

## 2013-07-19 MED ORDER — ALBUTEROL SULFATE HFA 108 (90 BASE) MCG/ACT IN AERS: 2.0000 | INHALATION_SPRAY | RESPIRATORY_TRACT | Status: DC | PRN

## 2013-07-19 MED ORDER — AMOXICILLIN 250 MG PO CAPS
1000.0000 mg | ORAL_CAPSULE | Freq: Once | ORAL | Status: AC
  Administered 2013-07-19: 1000 mg via ORAL
  Filled 2013-07-19: qty 4

## 2013-07-19 MED ORDER — ALBUTEROL SULFATE (5 MG/ML) 0.5% IN NEBU
2.5000 mg | INHALATION_SOLUTION | Freq: Once | RESPIRATORY_TRACT | Status: AC
  Administered 2013-07-19: 2.5 mg via RESPIRATORY_TRACT
  Filled 2013-07-19: qty 0.5

## 2013-07-19 MED ORDER — FLUCONAZOLE 150 MG PO TABS: 150.0000 mg | ORAL_TABLET | Freq: Once | ORAL | Status: DC

## 2013-07-19 MED ORDER — AMOXICILLIN 500 MG PO CAPS: 1000.0000 mg | ORAL_CAPSULE | Freq: Two times a day (BID) | ORAL | Status: DC

## 2013-07-19 NOTE — ED Notes (Signed)
Patient states she had flu shot last Monday and began getting sick on Saturday; patient states cough and congestion are getting worse.

## 2013-07-19 NOTE — ED Provider Notes (Signed)
CSN: [id number]     Arrival date & time 07/19/13  0159 History   First MD Initiated Contact with Patient 07/19/13 0340     Chief Complaint  Patient presents with  . Cough  . Nasal Congestion   (Consider location/radiation/quality/duration/timing/severity/associated sxs/prior Treatment) Patient is a 47 y.o. female presenting with cough. The history is provided by the patient.  Cough She started getting sick 3 days ago with nonproductive cough, nasal congestion. She thinks is related to a flu shot that she got 9 days ago. There's been post tussive emesis. She denies fever, chills, sweats. She is complaining of facial pressure and pain. There's been no chest pain. Denies arthralgias or myalgias. She was a cigarette smoker but is switching to electronic cigarette.  Past Medical History  Diagnosis Date  . coronary artery disease     RCA stenting Myoview 2011 EF 30% infarction dilatation without ischemia  . Systolic CHF   . Automatic implantable cardiac defibrillator in situ     a. s/p prior [employer] ICD with 6949 lead. b. s/p Gen change & lead revision 05/2013.  Marland Kitchen Other and unspecified hyperlipidemia   . Cardiomyopathy secondary     Patient has cardiomyopathy out of proportion to her ischemic heart disease  . Depression   . GERD (gastroesophageal reflux disease)   . Nicotine abuse   . Insomnia   . Migraines   . Lupus (systemic lupus erythematosus)   . Arthritis of knee   . Myocardial infarction     8 total   . Diabetes mellitus   . VF (ventricular fibrillation)     a. Hx appropriate ICD therapy for VF.  Marland Kitchen Paroxysmal VT    Past Surgical History  Procedure Laterality Date  . Cholecystectomy    . Abdominal hysterectomy    . Rotator cuff repair  2002  . Defibrillator placed   2009  . Colonoscopy  07/15/2011    SLF: internal hemorrhoids/hyperplastic polyps in the rectum/tubular adenomaSURVEILLANCE Nov 2017  . Icd generator change  06/17/2013    Dr Graciela Husbands   Family History   Problem Relation Age of Onset  . Hepatitis Mother 60    HCV  . Liver cancer Mother 87   History  Substance Use Topics  . Smoking status: Current Every Day Smoker -- 0.50 packs/day for 7 years    Types: Cigarettes  . Smokeless tobacco: Never Used  . Alcohol Use: Yes     Comment: occasionaly   OB History   Grav Para Term Preterm Abortions TAB SAB Ect Mult Living                 Review of Systems  Respiratory: Positive for cough.   All other systems reviewed and are negative.    Allergies  Bee venom and Cortisone  Home Medications   Current Outpatient Rx  Name  Route  Sig  Dispense  Refill  . ALPRAZolam (XANAX) 0.25 MG tablet      One tablet at bedtime for anxiety and sleep   30 tablet   3   . aspirin EC 81 MG tablet   Oral   Take 1 tablet (81 mg total) by mouth daily.         . Aspirin-Acetaminophen (GOODYS BODY PAIN PO)   Oral   Take 1 Package by mouth daily as needed. For pain         . carvedilol (COREG) 3.125 MG tablet   Oral   Take 1 tablet (3.125 mg total)  by mouth 2 (two) times daily.   60 tablet   12   . fenofibrate (TRICOR) 145 MG tablet   Oral   Take 1 tablet (145 mg total) by mouth daily.   30 tablet   4   . lisinopril (PRINIVIL,ZESTRIL) 2.5 MG tablet   Oral   Take 1 tablet (2.5 mg total) by mouth daily.   90 tablet   3   . metFORMIN (GLUCOPHAGE XR) 500 MG 24 hr tablet   Oral   Take 1 tablet (500 mg total) by mouth daily with breakfast.         . oxyCODONE (ROXICODONE) 15 MG immediate release tablet   Oral   Take 30 mg by mouth every 8 (eight) hours.         . rosuvastatin (CRESTOR) 40 MG tablet   Oral   Take 40 mg by mouth at bedtime.          BP 120/72  Pulse 99  Temp(Src) 98.5 F (36.9 C) (Oral)  Resp 22  Ht 5\' 6"  (1.676 m)  Wt 215 lb (97.523 kg)  BMI 34.72 kg/m2  SpO2 100% Physical Exam  Nursing note and vitals reviewed.  47 year old female, resting comfortably and in no acute distress. Vital signs are  significant for mild tachypnea with respiratory of 22. Oxygen saturation is 100%, which is normal. Head is normocephalic and atraumatic. PERRLA, EOMI. Oropharynx is clear. There is marked tenderness to palpation over frontal and maxillary sinuses. Mild purulent drainage is seen around the turbinates. Neck is nontender and supple without adenopathy or JVD. Back is nontender and there is no CVA tenderness. Lungs have coarse breath sounds with prolonged exhalation phase. Cough is triggered with forced exhalation with slight wheezy component. There are no overt rales, wheezes, or rhonchi. Chest is nontender. Heart has regular rate and rhythm without murmur. Abdomen is soft, flat, nontender without masses or hepatosplenomegaly and peristalsis is normoactive. Extremities have no cyanosis or edema, full range of motion is present. Skin is warm and dry without rash. Neurologic: Mental status is normal, cranial nerves are intact, there are no motor or sensory deficits.  ED Course  Procedures (including critical care time) Imaging Review Dg Chest 2 View  07/19/2013   CLINICAL DATA:  Coughing congestion.  EXAM: CHEST  2 VIEW  COMPARISON:  Chest radiograph June 18, 2013  FINDINGS: Cardiomediastinal silhouette is unremarkable. Dual lead left cardiac defibrillator in situ, lead tip positions unchanged. The lungs are clear without pleural effusions or focal consolidations. Pulmonary vasculature is unremarkable. Trachea projects midline and there is no pneumothorax. Soft tissue planes and included osseous structures are nonsuspicious. Surgical clips in the right abdomen likely reflect cholecystectomy.  IMPRESSION: No active cardiopulmonary disease.   Electronically Signed   By: Awilda Metro   On: 07/19/2013 02:57     MDM   1. Acute sinusitis   2. Acute bronchitis    Respiratory tract infection with sinusitis and bronchitis and posttussive emesis. Because the presence of sinusitis, she will be  started on antibiotics and is given initial dose of amoxicillin. She'll be given albuterol with ipratropium via nebulizer to try and control her coughing.  She feels much better after further treatment. She is discharged with prescriptions for albuterol inhaler, amoxicillin, and also for Diflucan which she is to take after completing the course of healing about ex. She's given Diflucan because she usually gets yeast infections with antibiotics.  Dione Booze, MD 07/19/13 [phone]

## 2013-07-20 LAB — COMPLETE METABOLIC PANEL WITH GFR
AST: 25 U/L (ref 0–37)
Albumin: 3.9 g/dL (ref 3.5–5.2)
Alkaline Phosphatase: 70 U/L (ref 39–117)
BUN: 7 mg/dL (ref 6–23)
CO2: 25 mEq/L (ref 19–32)
Calcium: 9.4 mg/dL (ref 8.4–10.5)
Chloride: 108 mEq/L (ref 96–112)
Creat: 0.82 mg/dL (ref 0.50–1.10)
GFR, Est African American: >89 mL/min
GFR, Est Non African American: 84 mL/min
Glucose, Bld: 112 mg/dL — ABNORMAL HIGH (ref 70–99)
Potassium: 4.7 mEq/L (ref 3.5–5.3)

## 2013-07-20 LAB — LIPID PANEL
Cholesterol: 108 mg/dL (ref 0–200)
HDL: 23 mg/dL — ABNORMAL LOW (ref >39)
Triglycerides: 80 mg/dL (ref <150)
VLDL: 16 mg/dL (ref 0–40)

## 2013-07-21 LAB — VITAMIN D 25 HYDROXY (VIT D DEFICIENCY, FRACTURES): Vit D, 25-Hydroxy: 10 ng/mL — ABNORMAL LOW (ref 30–89)

## 2013-07-21 LAB — HEMOGLOBIN A1C: Mean Plasma Glucose: 140 mg/dL — ABNORMAL HIGH (ref <117)

## 2013-07-22 ENCOUNTER — Other Ambulatory Visit: Payer: Self-pay | Admitting: Family Medicine

## 2013-08-05 ENCOUNTER — Ambulatory Visit: Payer: PRIVATE HEALTH INSURANCE | Admitting: Urology

## 2013-08-05 ENCOUNTER — Other Ambulatory Visit: Payer: Self-pay

## 2013-08-05 MED ORDER — ERGOCALCIFEROL 1.25 MG (50000 UT) PO CAPS: 50000.0000 [IU] | ORAL_CAPSULE | ORAL | Status: DC

## 2013-08-23 ENCOUNTER — Telehealth: Payer: Self-pay | Admitting: Family Medicine

## 2013-08-23 NOTE — Telephone Encounter (Signed)
Pls write a letter stating that she has severe chronic illness, and living in a home which is not appropriately heated is detrimental to her health. I will sign

## 2013-08-23 NOTE — Telephone Encounter (Signed)
Heat out in house and it will cost her 5000.00 to get it fixed and went to the [place] to get help with kerosene and she needs a letter stating that she has  Lupus and heart problems and that she needs heat in her house to keep her from getting sick from her Doctor please call let patient know

## 2013-08-23 NOTE — Telephone Encounter (Signed)
Letter typed for Dr to sign

## 2013-09-16 ENCOUNTER — Ambulatory Visit (INDEPENDENT_AMBULATORY_CARE_PROVIDER_SITE_OTHER): Payer: PRIVATE HEALTH INSURANCE | Admitting: Urology

## 2013-09-16 DIAGNOSIS — A5903 Trichomonal cystitis and urethritis: Secondary | ICD-10-CM

## 2013-09-16 DIAGNOSIS — R3129 Other microscopic hematuria: Secondary | ICD-10-CM

## 2013-09-17 ENCOUNTER — Encounter: Payer: Self-pay | Admitting: Internal Medicine

## 2013-09-26 ENCOUNTER — Encounter: Payer: Self-pay | Admitting: Internal Medicine

## 2013-09-26 ENCOUNTER — Ambulatory Visit (INDEPENDENT_AMBULATORY_CARE_PROVIDER_SITE_OTHER): Payer: PRIVATE HEALTH INSURANCE | Admitting: Internal Medicine

## 2013-09-26 VITALS — BP 113/77 | HR 77 | Ht 66.0 in | Wt 212.0 lb

## 2013-09-26 DIAGNOSIS — M7502 Adhesive capsulitis of left shoulder: Secondary | ICD-10-CM

## 2013-09-26 DIAGNOSIS — I472 Ventricular tachycardia: Secondary | ICD-10-CM

## 2013-09-26 DIAGNOSIS — I255 Ischemic cardiomyopathy: Secondary | ICD-10-CM

## 2013-09-26 DIAGNOSIS — I2589 Other forms of chronic ischemic heart disease: Secondary | ICD-10-CM

## 2013-09-26 DIAGNOSIS — I4901 Ventricular fibrillation: Secondary | ICD-10-CM

## 2013-09-26 DIAGNOSIS — Z95810 Presence of automatic (implantable) cardiac defibrillator: Secondary | ICD-10-CM

## 2013-09-26 DIAGNOSIS — I5022 Chronic systolic (congestive) heart failure: Secondary | ICD-10-CM

## 2013-09-26 DIAGNOSIS — I4729 Other ventricular tachycardia: Secondary | ICD-10-CM

## 2013-09-26 DIAGNOSIS — I509 Heart failure, unspecified: Secondary | ICD-10-CM

## 2013-09-26 DIAGNOSIS — M7500 Adhesive capsulitis of unspecified shoulder: Secondary | ICD-10-CM

## 2013-09-26 LAB — MDC_IDC_ENUM_SESS_TYPE_INCLINIC
Battery Remaining Longevity: 137 mo
Brady Statistic RV Percent Paced: 0.01 %
Date Time Interrogation Session: 20150209154928
HIGH POWER IMPEDANCE MEASURED VALUE: 73 Ohm
HighPow Impedance: 228 Ohm
Lead Channel Impedance Value: 589 Ohm
Lead Channel Pacing Threshold Pulse Width: 0.4 ms
Lead Channel Sensing Intrinsic Amplitude: 5.875 mV
Lead Channel Setting Pacing Amplitude: 2.5 V
Lead Channel Setting Pacing Pulse Width: 0.4 ms
Lead Channel Setting Sensing Sensitivity: 0.3 mV
MDC IDC MSMT BATTERY VOLTAGE: 3.13 V
MDC IDC MSMT LEADCHNL RV PACING THRESHOLD AMPLITUDE: 0.5 V
MDC IDC SET ZONE DETECTION INTERVAL: 300 ms
MDC IDC SET ZONE DETECTION INTERVAL: 460 ms
Zone Setting Detection Interval: 200 ms
Zone Setting Detection Interval: 250 ms

## 2013-09-26 MED ORDER — ASPIRIN 81 MG PO TABS: 81.0000 mg | ORAL_TABLET | Freq: Every day | ORAL | Status: AC

## 2013-09-26 NOTE — Assessment & Plan Note (Signed)
The patient's device was interrogated.  The information was reviewed. No changes were made in the programming.    

## 2013-09-26 NOTE — Assessment & Plan Note (Signed)
I have reviewed with her exercises to facilitate range of motion with her shoulder. We will plan to have her work on this for a couple of weeks. Event that there is no progress in the next 6 weeks we will refer her to rehabilitation

## 2013-09-26 NOTE — Progress Notes (Signed)
kf      Patient Care Team: Fayrene Helper, MD as PCP - Strawn, MD (Gastroenterology)   HPI  [name] is a 48 y.o. female is seen in followup for coronary artery disease with prior inferior wall MI treated with stenting .  She is status post ICD implantation for primary prevention. She then had subsequent appropriate therapy for ventricular fibrillation. The records also suggest that she has had inappropriate therapy.   She underwent device generator replacement 10/14 with the insertion of the new ICD lead to replace the 6949-lead  The patient denies chest pain, shortness of breath, nocturnal dyspnea, orthopnea or peripheral edema. There have been no palpitations, lightheadedness or syncope.  Recent nuclear imaging (3/11) listed under NOTES-CV procedure--demonstrated ejection fraction of 30% with an infarct and no ischemia.   She has completed her associates and bachelors degree in business now her masters  she is struggling with her school work  She has complaints related to shoulder discomfort as well as to the incision.;        Past Medical History  Diagnosis Date  . coronary artery disease     RCA stenting Myoview 2011 EF 30% infarction dilatation without ischemia  . Systolic CHF   . Automatic implantable cardiac defibrillator in situ     a. s/p prior [employer] ICD with 6949 lead. b. s/p Gen change & lead revision 05/2013.  Marland Kitchen Other and unspecified hyperlipidemia   . Cardiomyopathy secondary     Patient has cardiomyopathy out of proportion to her ischemic heart disease  . Depression   . GERD (gastroesophageal reflux disease)   . Nicotine abuse   . Insomnia   . Migraines   . Lupus (systemic lupus erythematosus)   . Arthritis of knee   . Myocardial infarction     8 total   . Diabetes mellitus   . VF (ventricular fibrillation)     a. Hx appropriate ICD therapy for VF.  Marland Kitchen Paroxysmal VT     Past Surgical History  Procedure Laterality  Date  . Cholecystectomy    . Abdominal hysterectomy    . Rotator cuff repair  2002  . Defibrillator placed   2009  . Colonoscopy  07/15/2011    SLF: internal hemorrhoids/hyperplastic polyps in the rectum/tubular adenomaSURVEILLANCE Nov 2017  . Icd generator change  06/17/2013    Dr Caryl Comes    Current Outpatient Prescriptions  Medication Sig Dispense Refill  . albuterol (PROAIR HFA) 108 (90 BASE) MCG/ACT inhaler As directed      . albuterol (PROVENTIL HFA;VENTOLIN HFA) 108 (90 BASE) MCG/ACT inhaler Inhale 2 puffs into the lungs every 2 (two) hours as needed for wheezing or shortness of breath (cough).  1 Inhaler  0  . ALPRAZolam (XANAX) 0.5 MG tablet 0.5 mg. As directed      . aspirin 325 MG tablet 325 mg daily.      . Aspirin-Acetaminophen (GOODYS BODY PAIN PO) Take 1 Package by mouth daily as needed. For pain      . atorvastatin (LIPITOR) 20 MG tablet 40 mg daily.      . carvedilol (COREG) 3.125 MG tablet Take 1 tablet (3.125 mg total) by mouth 2 (two) times daily.  60 tablet  12  . clopidogrel (PLAVIX) 75 MG tablet 75 mg daily.      . fenofibrate (TRICOR) 145 MG tablet Take by mouth.      . fluconazole (DIFLUCAN) 150 MG tablet Take 1 tablet (150 mg total)  by mouth once. Take after completing the course of antibiotics.  1 tablet  0  . hydroxychloroquine (PLAQUENIL) 200 MG tablet 200 mg daily.      Marland Kitchen lisinopril (PRINIVIL,ZESTRIL) 2.5 MG tablet Take 1 tablet (2.5 mg total) by mouth daily.  90 tablet  3  . metFORMIN (GLUCOPHAGE XR) 500 MG 24 hr tablet Take 1 tablet (500 mg total) by mouth daily with breakfast.      . oxyCODONE (ROXICODONE) 15 MG immediate release tablet Take 30 mg by mouth every 8 (eight) hours.      . rosuvastatin (CRESTOR) 40 MG tablet Take 40 mg by mouth at bedtime.      . Vitamin D, Ergocalciferol, (DRISDOL) 50000 UNITS CAPS capsule daily.       No current facility-administered medications for this visit.    Allergies  Allergen Reactions  . Bee Venom Anaphylaxis    . Cortisone Swelling    Review of Systems negative except from HPI and PMH  Physical Exam BP 113/77  Pulse 77  Ht 5\' 6"  (1.676 m)  Wt 212 lb (96.163 kg)  BMI 34.23 kg/m2 Well developed and well nourished in no acute distress HENT normal E scleral and icterus clear Neck Supple JVP flat; carotids brisk and full Clear to ausculation Device pocket well healed; without hematoma or erythema.  There is no tethering of Regular rate and rhythm, no murmurs gallops or rub Soft with active bowel sounds No clubbing cyanosn no Edema Alert and oriented, grossly normal motor and sensory function There is limitations of her left shoulder at about 75 Skin Warm and Dry    Assessment and  Plan

## 2013-09-26 NOTE — Patient Instructions (Addendum)
Your physician has recommended you make the following change in your medication:  1) Decrease Aspirin to 81 mg daily   Remote monitoring is used to monitor your Pacemaker of ICD from home. This monitoring reduces the number of office visits required to check your device to one time per year. It allows [place] to keep an eye on the functioning of your device to ensure it is working properly. You are scheduled for a device check from home on 12/28/13. You may send your transmission at any time that day. If you have a wireless device, the transmission will be sent automatically. After your physician reviews your transmission, you will receive a postcard with your next transmission date.  Your physician recommends that you schedule a follow-up appointment in: 6-8 weeks with Dr. Caryl Comes.

## 2013-09-26 NOTE — Assessment & Plan Note (Signed)
contnue current meds 

## 2013-09-26 NOTE — Assessment & Plan Note (Signed)
No intercurrent Ventricular tachycardia  

## 2013-10-26 ENCOUNTER — Encounter: Payer: PRIVATE HEALTH INSURANCE | Admitting: Internal Medicine

## 2013-11-08 ENCOUNTER — Encounter: Payer: Self-pay | Admitting: Family Medicine

## 2013-11-08 ENCOUNTER — Ambulatory Visit (INDEPENDENT_AMBULATORY_CARE_PROVIDER_SITE_OTHER): Payer: PRIVATE HEALTH INSURANCE | Admitting: Family Medicine

## 2013-11-08 VITALS — BP 130/76 | HR 90 | Resp 18 | Ht 66.0 in | Wt 215.1 lb

## 2013-11-08 DIAGNOSIS — I10 Essential (primary) hypertension: Secondary | ICD-10-CM

## 2013-11-08 DIAGNOSIS — E669 Obesity, unspecified: Secondary | ICD-10-CM

## 2013-11-08 DIAGNOSIS — F17200 Nicotine dependence, unspecified, uncomplicated: Secondary | ICD-10-CM

## 2013-11-08 DIAGNOSIS — M899 Disorder of bone, unspecified: Secondary | ICD-10-CM

## 2013-11-08 DIAGNOSIS — M949 Disorder of cartilage, unspecified: Secondary | ICD-10-CM

## 2013-11-08 DIAGNOSIS — E785 Hyperlipidemia, unspecified: Secondary | ICD-10-CM

## 2013-11-08 DIAGNOSIS — E8881 Metabolic syndrome: Secondary | ICD-10-CM

## 2013-11-08 DIAGNOSIS — L309 Dermatitis, unspecified: Secondary | ICD-10-CM | POA: Insufficient documentation

## 2013-11-08 DIAGNOSIS — L259 Unspecified contact dermatitis, unspecified cause: Secondary | ICD-10-CM

## 2013-11-08 DIAGNOSIS — M8700 Idiopathic aseptic necrosis of unspecified bone: Secondary | ICD-10-CM

## 2013-11-08 DIAGNOSIS — I4729 Other ventricular tachycardia: Secondary | ICD-10-CM

## 2013-11-08 DIAGNOSIS — I472 Ventricular tachycardia: Secondary | ICD-10-CM

## 2013-11-08 DIAGNOSIS — E119 Type 2 diabetes mellitus without complications: Secondary | ICD-10-CM

## 2013-11-08 DIAGNOSIS — F411 Generalized anxiety disorder: Secondary | ICD-10-CM

## 2013-11-08 MED ORDER — FLUCONAZOLE 150 MG PO TABS: 150.0000 mg | ORAL_TABLET | Freq: Once | ORAL | Status: DC

## 2013-11-08 MED ORDER — PREDNISONE 5 MG PO KIT: PACK | ORAL | Status: DC

## 2013-11-08 MED ORDER — HYDROXYZINE HCL 50 MG PO TABS: 50.0000 mg | ORAL_TABLET | Freq: Three times a day (TID) | ORAL | Status: DC | PRN

## 2013-11-08 MED ORDER — CEPHALEXIN 500 MG PO CAPS: 500.0000 mg | ORAL_CAPSULE | Freq: Four times a day (QID) | ORAL | Status: DC

## 2013-11-08 NOTE — Progress Notes (Signed)
Subjective:    Patient ID: [name], female    DOB: 12/17/1965, 48 y.o.   MRN: [id number]  HPI The PT is here for follow up and re-evaluation of chronic medical conditions, medication management and review of any available recent lab and radiology data.  Preventive health is updated, specifically  Cancer screening and Immunization.   Continues to follow with nephrology at [facility] and also with cardiology. The PT denies any adverse reactions to current medications since the last visit.  5 day h/o generalized rash which is spreading, itches a lot and pus draining from some areas where she has scratched herself, no fever or chills   Denies polyiuria , polydipsia or blurred vision.  Unwilling to commit to smoking cessation, conselling done on the need to quit   Review of Systems See HPI Denies recent fever or chills. Denies sinus pressure, nasal congestion, ear pain or sore throat. Denies chest congestion, productive cough or wheezing. Denies chest pains, palpitations and leg swelling Denies abdominal pain, nausea, vomiting,diarrhea or constipation.   Denies dysuria, frequency, hesitancy or incontinence. Denies joint pain, swelling and limitation in mobility. Denies headaches, seizures, numbness, or tingling. Denies depression, anxiety or insomnia.         Objective:   Physical Exam BP 130/76  Pulse 90  Resp 18  Ht 5\' 6"  (1.676 m)  Wt 215 lb 1.9 oz (97.578 kg)  BMI 34.74 kg/m2  SpO2 97% Patient alert and oriented and in no cardiopulmonary distress.  HEENT: No facial asymmetry, EOMI, no sinus tenderness,  oropharynx pink and moist.  Neck supple no adenopathy.  Chest: Clear to auscultation bilaterally.Decreased though adequate  CVS: S1, S2 no murmurs, no S3.  ABD: Soft non tender. Bowel sounds normal.  Ext: No edema  MS: Adequate ROM spine, shoulders, hips and knees.  Skin: erythematous maculopapular rash on trunk and limbs some have  Pus draining Psych:  Good eye contact, normal affect. Memory intact not anxious or depressed appearing.  CNS: CN 2-12 intact, power, tone and sensation normal throughout.        Assessment & Plan:  Dermatitis allergic reaction generalized with superinfection by bacteria. Steroids, antihistamine and antibiotic course. No evidence of cardiopulmonary compromise or involvement  HYPERLIPIDEMIA Controlled, no change in medication Updated lab needed at/ before next visit. Hyperlipidemia:Low fat diet discussed and encouraged.    Nicotine dependence Unchanged, Patient counseled for approximately 5 minutes regarding the health risks of ongoing nicotine use, specifically all types of cancer, heart disease, stroke and respiratory failure. The options available for help with cessation ,the behavioral changes to assist the process, and the option to either gradully reduce usage  Or abruptly stop.is also discussed. Pt is also encouraged to set specific goals in number of cigarettes used daily, as well as to set a quit date.   ANXIETY Stable, continue xanax as before  AVASCULAR NECROSIS Pain management through [place], TYPE II Controlled, no change in medication Patient advised to reduce carb and sweets, commit to regular physical activity, take meds as prescribed, test blood as directed, and attempt to lose weight, to improve blood sugar control. Updated lab needed at/ before next visit.   HTN, goal below 130/80 Controlled, no change in medication DASH diet and commitment to daily physical activity for a minimum of 30 minutes discussed and encouraged, as a part of hypertension management. The importance of attaining a healthy weight is also discussed.   Ventricular tachycardia (paroxysmal) No episodes reported since last  visit, continues to follow closely with cardiology  Obesity (BMI 30.0-34.9) unchnaged. Patient re-educated about  the importance of commitment to a  minimum of 150  minutes of exercise per week. The importance of healthy food choices with portion control discussed. Encouraged to start a food diary, count calories and to consider  joining a support group. Sample diet sheets offered. Goals set by the patient for the next several months.     Metabolic syndrome X The increased risk of cardiovascular disease associated with this diagnosis, and the need to consistently work on lifestyle to change this is discussed. Following  a  heart healthy diet ,commitment to 30 minutes of exercise at least 5 days per week, as well as control of blood sugar and cholesterol , and achieving a healthy weight are all the areas to be addressed .

## 2013-11-08 NOTE — Patient Instructions (Addendum)
F/u in 2 nd week in August, call if you need me before  You are being treated for an allergic reaction affecting your skin as well as cellulitis.Call in next 2 to 3 days if worsens Depomedrol 80 mg IM in the office and benadryl 50 mg iM ONLY  if you have someone to take you home  Medication is sent in prednisone for 6 days, keflex for 5 days and hydroxyzine for the itch.  Fasting lipid, cmp and EGFr, hBa1C, vit D and microalb  First week in August

## 2013-11-09 MED ORDER — DIPHENHYDRAMINE HCL 50 MG/ML IJ SOLN
50.0000 mg | Freq: Once | INTRAMUSCULAR | Status: AC
  Administered 2013-11-08: 50 mg via INTRAMUSCULAR

## 2013-11-09 MED ORDER — METHYLPREDNISOLONE ACETATE 80 MG/ML IJ SUSP
80.0000 mg | Freq: Once | INTRAMUSCULAR | Status: AC
  Administered 2013-11-08: 80 mg via INTRAMUSCULAR

## 2013-11-11 ENCOUNTER — Ambulatory Visit: Payer: PRIVATE HEALTH INSURANCE | Admitting: Urology

## 2013-12-04 ENCOUNTER — Telehealth: Payer: Self-pay | Admitting: Family Medicine

## 2013-12-04 DIAGNOSIS — E669 Obesity, unspecified: Secondary | ICD-10-CM | POA: Insufficient documentation

## 2013-12-04 DIAGNOSIS — E8881 Metabolic syndrome: Secondary | ICD-10-CM | POA: Insufficient documentation

## 2013-12-04 DIAGNOSIS — E66811 Obesity, class 1: Secondary | ICD-10-CM | POA: Insufficient documentation

## 2013-12-04 NOTE — Assessment & Plan Note (Signed)
Stable, continue xanax as before

## 2013-12-04 NOTE — Assessment & Plan Note (Signed)
The increased risk of cardiovascular disease associated with this diagnosis, and the need to consistently work on lifestyle to change this is discussed. Following  a  heart healthy diet ,commitment to 30 minutes of exercise at least 5 days per week, as well as control of blood sugar and cholesterol , and achieving a healthy weight are all the areas to be addressed .  

## 2013-12-04 NOTE — Telephone Encounter (Signed)
Info already available , eye exam done 09/2013

## 2013-12-04 NOTE — Assessment & Plan Note (Signed)
Controlled, no change in medication Updated lab needed at/ before next visit. Hyperlipidemia:Low fat diet discussed and encouraged.   

## 2013-12-04 NOTE — Assessment & Plan Note (Signed)
Controlled, no change in medication Patient advised to reduce carb and sweets, commit to regular physical activity, take meds as prescribed, test blood as directed, and attempt to lose weight, to improve blood sugar control. Updated lab needed at/ before next visit.  

## 2013-12-04 NOTE — Assessment & Plan Note (Addendum)
unchnaged Patient re-educated about  the importance of commitment to a  minimum of 150 minutes of exercise per week. The importance of healthy food choices with portion control discussed. Encouraged to start a food diary, count calories and to consider  joining a support group. Sample diet sheets offered. Goals set by the patient for the next several months.    

## 2013-12-04 NOTE — Assessment & Plan Note (Signed)
allergic reaction generalized with superinfection by bacteria. Steroids, antihistamine and antibiotic course. No evidence of cardiopulmonary compromise or involvement

## 2013-12-04 NOTE — Assessment & Plan Note (Signed)
Controlled, no change in medication DASH diet and commitment to daily physical activity for a minimum of 30 minutes discussed and encouraged, as a part of hypertension management. The importance of attaining a healthy weight is also discussed.  

## 2013-12-04 NOTE — Assessment & Plan Note (Signed)
No episodes reported since last visit, continues to follow closely with cardiology

## 2013-12-04 NOTE — Assessment & Plan Note (Signed)
Pain management through [facility]

## 2013-12-04 NOTE — Assessment & Plan Note (Signed)
Unchanged, Patient counseled for approximately 5 minutes regarding the health risks of ongoing nicotine use, specifically all types of cancer, heart disease, stroke and respiratory failure. The options available for help with cessation ,the behavioral changes to assist the process, and the option to either gradully reduce usage  Or abruptly stop.is also discussed. Pt is also encouraged to set specific goals in number of cigarettes used daily, as well as to set a quit date.  

## 2013-12-23 ENCOUNTER — Ambulatory Visit: Payer: PRIVATE HEALTH INSURANCE | Admitting: Urology

## 2014-02-06 ENCOUNTER — Encounter: Payer: Self-pay | Admitting: Internal Medicine

## 2014-02-06 ENCOUNTER — Ambulatory Visit (INDEPENDENT_AMBULATORY_CARE_PROVIDER_SITE_OTHER): Payer: PRIVATE HEALTH INSURANCE | Admitting: Internal Medicine

## 2014-02-06 VITALS — BP 112/78 | HR 99 | Ht 67.0 in | Wt 212.0 lb

## 2014-02-06 DIAGNOSIS — I472 Ventricular tachycardia, unspecified: Secondary | ICD-10-CM

## 2014-02-06 DIAGNOSIS — I5022 Chronic systolic (congestive) heart failure: Secondary | ICD-10-CM

## 2014-02-06 DIAGNOSIS — I255 Ischemic cardiomyopathy: Secondary | ICD-10-CM

## 2014-02-06 DIAGNOSIS — I509 Heart failure, unspecified: Secondary | ICD-10-CM

## 2014-02-06 DIAGNOSIS — I4729 Other ventricular tachycardia: Secondary | ICD-10-CM

## 2014-02-06 DIAGNOSIS — Z95810 Presence of automatic (implantable) cardiac defibrillator: Secondary | ICD-10-CM

## 2014-02-06 DIAGNOSIS — I2589 Other forms of chronic ischemic heart disease: Secondary | ICD-10-CM

## 2014-02-06 LAB — MDC_IDC_ENUM_SESS_TYPE_INCLINIC
Battery Remaining Longevity: 135 mo
Battery Voltage: 3.07 V
Brady Statistic RV Percent Paced: 0.01 %
Date Time Interrogation Session: 20150622170043
HighPow Impedance: 228 Ohm
HighPow Impedance: 74 Ohm
Lead Channel Sensing Intrinsic Amplitude: 4.375 mV
Lead Channel Sensing Intrinsic Amplitude: 8.25 mV
Lead Channel Setting Pacing Amplitude: 2.5 V
Lead Channel Setting Pacing Pulse Width: 0.4 ms
Lead Channel Setting Sensing Sensitivity: 0.3 mV
MDC IDC MSMT LEADCHNL RV IMPEDANCE VALUE: 551 Ohm
MDC IDC MSMT LEADCHNL RV PACING THRESHOLD AMPLITUDE: 0.625 V
MDC IDC MSMT LEADCHNL RV PACING THRESHOLD PULSEWIDTH: 0.4 ms
MDC IDC SET ZONE DETECTION INTERVAL: 200 ms
MDC IDC SET ZONE DETECTION INTERVAL: 300 ms
Zone Setting Detection Interval: 250 ms
Zone Setting Detection Interval: 460 ms

## 2014-02-06 NOTE — Patient Instructions (Signed)

## 2014-02-06 NOTE — Progress Notes (Signed)
Patient Care Team: Fayrene Helper, MD as PCP - Steele, MD (Gastroenterology)   HPI  [name] is a 48 y.o. female Seen in followup for symptoms suggestive of adhesive capsulitis. This is significantly improved.  She denies chest pain or shortness of breath. She remains in school.   His history and ICD implanted for primary prevention and 10/14 underwent generator replacement with revision of her 6949-lead.   Recent nuclear imaging (3/11) listed under NOTES-CV procedure--demonstrated ejection fraction of 30% with an infarct and no ischemia  she is a prior inferior wall MI he treated with stenting.    Past Medical History  Diagnosis Date  . coronary artery disease     RCA stenting Myoview 2011 EF 30% infarction dilatation without ischemia  . Systolic CHF   . Automatic implantable cardiac defibrillator in situ     a. s/p prior Medtronic ICD with 6949 lead. b. s/p Gen change & lead revision 05/2013.  Marland Kitchen Other and unspecified hyperlipidemia   . Cardiomyopathy secondary     Patient has cardiomyopathy out of proportion to her ischemic heart disease  . Depression   . GERD (gastroesophageal reflux disease)   . Nicotine abuse   . Insomnia   . Migraines   . Lupus (systemic lupus erythematosus)   . Arthritis of knee   . Myocardial infarction     8 total   . Diabetes mellitus   . VF (ventricular fibrillation)     a. Hx appropriate ICD therapy for VF.  Marland Kitchen Paroxysmal VT     Past Surgical History  Procedure Laterality Date  . Cholecystectomy    . Abdominal hysterectomy    . Rotator cuff repair  2002  . Defibrillator placed   2009  . Colonoscopy  07/15/2011    SLF: internal hemorrhoids/hyperplastic polyps in the rectum/tubular adenomaSURVEILLANCE Nov 2017  . Icd generator change  06/17/2013    Dr Caryl Comes    Current Outpatient Prescriptions  Medication Sig Dispense Refill  . albuterol (PROAIR HFA) 108 (90 BASE) MCG/ACT inhaler As directed        . albuterol (PROVENTIL HFA;VENTOLIN HFA) 108 (90 BASE) MCG/ACT inhaler Inhale 2 puffs into the lungs every 2 (two) hours as needed for wheezing or shortness of breath (cough).  1 Inhaler  0  . ALPRAZolam (XANAX) 0.5 MG tablet 0.5 mg. As directed      . aspirin 81 MG tablet Take 1 tablet (81 mg total) by mouth daily.  30 tablet  6  . Aspirin-Acetaminophen (GOODYS BODY PAIN PO) Take 1 Package by mouth daily as needed. For pain      . atorvastatin (LIPITOR) 20 MG tablet 40 mg daily.      . carvedilol (COREG) 3.125 MG tablet Take 1 tablet (3.125 mg total) by mouth 2 (two) times daily.  60 tablet  12  . cephALEXin (KEFLEX) 500 MG capsule Take 1 capsule (500 mg total) by mouth 4 (four) times daily.  20 capsule  0  . clopidogrel (PLAVIX) 75 MG tablet 75 mg daily.      . fenofibrate (TRICOR) 145 MG tablet Take by mouth.      . fluconazole (DIFLUCAN) 150 MG tablet Take 1 tablet (150 mg total) by mouth once. Take after completing the course of antibiotics.  3 tablet  0  . hydroxychloroquine (PLAQUENIL) 200 MG tablet 200 mg daily.      . hydrOXYzine (ATARAX/VISTARIL) 50 MG tablet Take 1 tablet (50 mg  total) by mouth 3 (three) times daily as needed.  42 tablet  0  . lisinopril (PRINIVIL,ZESTRIL) 2.5 MG tablet Take 1 tablet (2.5 mg total) by mouth daily.  90 tablet  3  . metFORMIN (GLUCOPHAGE XR) 500 MG 24 hr tablet Take 1 tablet (500 mg total) by mouth daily with breakfast.      . oxyCODONE (ROXICODONE) 15 MG immediate release tablet Take 30 mg by mouth every 8 (eight) hours.      . PredniSONE 5 MG KIT As directed on Insert  21 each  0  . rosuvastatin (CRESTOR) 40 MG tablet Take 40 mg by mouth at bedtime.      . Vitamin D, Ergocalciferol, (DRISDOL) 50000 UNITS CAPS capsule daily.       No current facility-administered medications for this visit.    Allergies  Allergen Reactions  . Bee Venom Anaphylaxis  . Cortisone Swelling    Review of Systems negative except from HPI and PMH  Physical Exam BP  112/78  Pulse 99  Ht '5\' 7"'  (1.702 m)  Wt 212 lb (96.163 kg)  BMI 33.20 kg/m2 Well developed and well nourished in no acute distress HENT normal E scleral and icterus clear Neck Supple JVP flat; carotids brisk and full Clear to ausculation  Regular rate and rhythm, no murmurs gallops or rub Soft with active bowel sounds No clubbing cyanosis  Edema Alert and oriented, grossly normal motor and sensory function Skin Warm and Dry    Assessment and  Plan  Ischemic/nonischemic cardio myopathy  Implantable defibrillator-Medtronic S./P. revision    Adhesive capsulitis  Ventricular tachycardia  Congestive heart failure-chronic-systolic  Without symptoms of ischemia  Euvolemic continue current meds  She has had 2 episodes of ventricular tachycardia 1 polymorphic and rather brief the other could possibly have been SVT based on EGMs although the morphology discriminator suggested that was ventricular in origin  these were some months ago.  We will follow this along. Her shoulder is much improved.

## 2014-03-14 ENCOUNTER — Encounter: Payer: Self-pay | Admitting: Family Medicine

## 2014-03-14 ENCOUNTER — Ambulatory Visit (INDEPENDENT_AMBULATORY_CARE_PROVIDER_SITE_OTHER): Payer: PRIVATE HEALTH INSURANCE | Admitting: Family Medicine

## 2014-03-14 VITALS — BP 100/72 | HR 76 | Resp 18 | Ht 66.0 in | Wt 220.0 lb

## 2014-03-14 DIAGNOSIS — Z0000 Encounter for general adult medical examination without abnormal findings: Secondary | ICD-10-CM

## 2014-03-14 MED ORDER — ALPRAZOLAM 0.5 MG PO TABS: 0.5000 mg | ORAL_TABLET | Freq: Every evening | ORAL | Status: DC | PRN

## 2014-03-14 MED ORDER — BUSPIRONE HCL 5 MG PO TABS: 5.0000 mg | ORAL_TABLET | Freq: Three times a day (TID) | ORAL | Status: DC

## 2014-03-14 MED ORDER — FLUCONAZOLE 150 MG PO TABS: 150.0000 mg | ORAL_TABLET | Freq: Once | ORAL | Status: DC

## 2014-03-14 NOTE — Progress Notes (Signed)
Subjective:    Patient ID: [name], female    DOB: 1966-05-31, 48 y.o.   MRN: [id number]  HPI Preventive Screening-Counseling & Management   Patient present here today for a Medicare annual wellness visit.   Current Problems (verified)   Medications Prior to Visit Allergies (verified)   PAST HISTORY  Family History (verfied and updated)  Social History (verified and updated)   Risk Factors  Current exercise habits:  Leisure walking, needs to increase to 5 days   Dietary issues discussed: discussed fiber rich diet , low sodium, low carb   Cardiac risk factors: established heart disease Depression Screen  (Note: if answer to either of the following is "Yes", a more complete depression screening is indicated)   Over the past two weeks, have you felt down, depressed or hopeless? No  Over the past two weeks, have you felt little interest or pleasure in doing things? No  Have you lost interest or pleasure in daily life? No  Do you often feel hopeless? No  Do you cry easily over simple problems? No   Activities of Daily Living  In your present state of health, do you have any difficulty performing the following activities?  Driving?: No Managing money?: No Feeding yourself?:No Getting from bed to chair?:No Climbing a flight of stairs?:yes , SOB with activity, also knee instability due to arthritis Preparing food and eating?:No Bathing or showering?:No Getting dressed?:No Getting to the toilet?:No Using the toilet?:No Moving around from place to place?: No  Fall Risk Assessment In the past year have you fallen or had a near fall?:No Are you currently taking any medications that make you dizzy?:No   Hearing Difficulties: No Do you often ask people to speak up or repeat themselves?:No Do you experience ringing or noises in your ears?:No Do you have difficulty understanding soft or whispered voices?:No  Cognitive Testing  Alert? Yes Normal Appearance?Yes   Oriented to person? Yes Place? Yes  Time? Yes  Displays appropriate judgment?Yes  Can read the correct time from a watch face? yes Are you having problems remembering things?No  Advanced Directives have been discussed with the patient?Yes and given brochure, full code   List the Names of Other Physician/Practitioners you currently use: Care Teams updated    Indicate any recent Medical Services you may have received from other than [facility] providers in the past year (date may be approximate).   Assessment:    Annual Wellness Exam   Plan:      Medicare Attestation  I have personally reviewed:  The patient's medical and social history  Their use of alcohol, tobacco or illicit drugs  Their current medications and supplements  The patient's functional ability including ADLs,fall risks, home safety risks, cognitive, and hearing and visual impairment  Diet and physical activities  Evidence for depression or mood disorders  The patient's weight, height, BMI, and visual acuity have been recorded in the chart. I have made referrals, counseling, and provided education to the patient based on review of the above and I have provided the patient with a written personalized care plan for preventive services.      Review of Systems     Objective:   Physical Exam        Assessment & Plan:  Medicare annual wellness visit, initial Annual exam as documented. Counseling done  re healthy lifestyle involving commitment to 150 minutes exercise per week, heart healthy diet, and attaining healthy weight.The importance of adequate sleep also discussed. Regular  seat belt use and home safety, is also discussed. Changes in health habits are decided on by the patient with goals and time frames  set for achieving them. Immunization and cancer screening needs are specifically addressed at this visit.

## 2014-03-14 NOTE — Patient Instructions (Signed)
F/u 4 month, labs next monthas scheduled  New med for anxiety, need to stop smoking  Med for yeast infection and refill on xanax  Care not to fall

## 2014-04-27 ENCOUNTER — Ambulatory Visit (INDEPENDENT_AMBULATORY_CARE_PROVIDER_SITE_OTHER): Payer: 59 | Admitting: Family Medicine

## 2014-04-27 VITALS — BP 94/74 | HR 86 | Temp 97.7°F | Resp 20 | Ht 67.0 in | Wt 214.5 lb

## 2014-04-27 DIAGNOSIS — M545 Low back pain, unspecified: Secondary | ICD-10-CM

## 2014-04-27 DIAGNOSIS — R300 Dysuria: Secondary | ICD-10-CM

## 2014-04-27 LAB — POCT URINALYSIS DIPSTICK
BILIRUBIN UA: NEGATIVE
GLUCOSE UA: NEGATIVE
KETONES UA: NEGATIVE
Leukocytes, UA: NEGATIVE
Nitrite, UA: NEGATIVE
SPEC GRAV UA: 1.015
Urobilinogen, UA: 1
pH, UA: 5.5

## 2014-04-27 LAB — POCT UA - MICROSCOPIC ONLY
Casts, Ur, LPF, POC: NEGATIVE
Crystals, Ur, HPF, POC: NEGATIVE
MUCUS UA: NEGATIVE
Yeast, UA: NEGATIVE

## 2014-04-27 MED ORDER — CIPROFLOXACIN HCL 500 MG PO TABS: 500.0000 mg | ORAL_TABLET | Freq: Two times a day (BID) | ORAL | Status: DC

## 2014-04-27 NOTE — Progress Notes (Signed)
   Subjective:    Patient ID: [name], female    DOB: 07-30-1966, 48 y.o.   MRN: [id number]  HPI [name] is a 48yo female who presents for back pain and stomach pain. Onset was 2 days ago. She noticed bilateral lower back pain. She was sitting in a chair for a prolonged time studying business administration. She notes some increasing stress since her mother passed on 04-16-2023. No known injury. Right-sided low back pain is greater than left. Symptoms are aggravated with bending, twisting, or turning. Symptoms are relieved with standing. Character is a sharp pulling. Denies any radiation, urinary frequency, urgency, hematuria, fevers, or chills, or body aches. Tried taking Tylenol, her home oxycodine with little relief. She has a hx of back pain intermittently, with a known hx of bulging discs, which responded well to PT in the past. Denies any fevers, chills, unexplained wt loss, night sweats, saddle anesthesia, or loss of bladder or bowel.  She also notes that her "stomach hurts". Location is bilateral lower abdomen (PMHx of TAH), which she localized to her suprapubic area. Frequency is intermittent. Pain is aggravated if she tries to hold her urine in. She notes some urinary frequency. Denies any dysuria, hematuria, or malodorous urine. Denies any vaginal discharge or sexual activity for >53yr.  Gyn hx: TAH for menses associated syncope, hx of trichomonas (no recent exposures)   Review of Systems As per history of present illness, Levan point review of systems was performed is otherwise negative.    Objective:   Physical Exam BP 94/74  Pulse 86  Temp(Src) 97.7 F (36.5 C) (Oral)  Resp 20  Ht 5\' 7"  (1.702 m)  Wt 214 lb 8 oz (97.297 kg)  BMI 33.59 kg/m2  SpO2 100% General appearance: alert, cooperative and appears stated age Head: Normocephalic, without obvious abnormality, atraumatic Back: right paraspinal muscle tenderness at L3-L5 with tissue texture changes. No overlying  erythema. No bony tenderness. Lungs: clear to auscultation bilaterally and normal percussion bilaterally Heart: regular rate and rhythm, S1, S2 normal, no murmur, click, rub or gallop Abdomen: soft, non-tender; bowel sounds normal; no masses,  no organomegaly and +Suprapubic tenderness Extremities: extremities normal, atraumatic, no cyanosis or edema Pulses: 2+ and symmetric Skin: Skin color, texture, turgor normal. No rashes or lesions Lymph nodes: Cervical, supraclavicular, and axillary nodes normal. Neurologic: Alert and oriented X 3, normal strength and tone. Normal symmetric reflexes. Normal coordination and gait  Urine Studies: trc RBCs, trc Prot, trc bact. Ordered Urine Culture.    Assessment & Plan:  1. Dysuria -Rx Cipro 500mg   Bid x3 days -f/u Urine C&S -Instructions given to RTC or go to ED if fevers, chills, worsening pain, or malaise.  2. Low back pain with associated right-sided paraspinal muscle spasm, no CVA tenderness -No neurologic red flags on exam today. -She may take her home oxycodone and tylenol as directed -Offered referral for formal PT, but patient declined at this time.  Rachel Brood, DO Sports medicine fellow

## 2014-04-27 NOTE — Patient Instructions (Signed)

## 2014-04-29 LAB — URINE CULTURE
Colony Count: NO GROWTH
Organism ID, Bacteria: NO GROWTH

## 2014-05-02 ENCOUNTER — Telehealth: Payer: Self-pay | Admitting: *Deleted

## 2014-05-02 MED ORDER — FLUCONAZOLE 150 MG PO TABS: 150.0000 mg | ORAL_TABLET | Freq: Once | ORAL | Status: DC

## 2014-05-02 NOTE — Telephone Encounter (Signed)
Patient aware, med sent 

## 2014-05-02 NOTE — Telephone Encounter (Signed)
Pt called stating she has finished the antibiotic that Dr. Moshe Cipro has put her on and she now has a yeast infection pt is requesting something for it. Please advise [phone]

## 2014-05-08 ENCOUNTER — Ambulatory Visit (INDEPENDENT_AMBULATORY_CARE_PROVIDER_SITE_OTHER): Payer: PRIVATE HEALTH INSURANCE | Admitting: *Deleted

## 2014-05-08 ENCOUNTER — Telehealth: Payer: Self-pay | Admitting: Cardiology

## 2014-05-08 ENCOUNTER — Encounter: Payer: Self-pay | Admitting: Internal Medicine

## 2014-05-08 DIAGNOSIS — I4729 Other ventricular tachycardia: Secondary | ICD-10-CM

## 2014-05-08 DIAGNOSIS — I2589 Other forms of chronic ischemic heart disease: Secondary | ICD-10-CM

## 2014-05-08 DIAGNOSIS — I472 Ventricular tachycardia: Secondary | ICD-10-CM

## 2014-05-08 DIAGNOSIS — I255 Ischemic cardiomyopathy: Secondary | ICD-10-CM

## 2014-05-08 NOTE — Progress Notes (Signed)
Remote ICD transmission.   

## 2014-05-08 NOTE — Telephone Encounter (Signed)
Spoke with pt and reminded pt of remote transmission that is due today. Pt verbalized understanding.   

## 2014-05-09 LAB — MDC_IDC_ENUM_SESS_TYPE_REMOTE
Battery Remaining Longevity: 134 mo
Battery Voltage: 3.05 V
Brady Statistic RV Percent Paced: 0.01 %
Date Time Interrogation Session: 20150921192107
HIGH POWER IMPEDANCE MEASURED VALUE: 190 Ohm
HighPow Impedance: 82 Ohm
Lead Channel Impedance Value: 589 Ohm
Lead Channel Pacing Threshold Amplitude: 0.625 V
Lead Channel Sensing Intrinsic Amplitude: 9.125 mV
Lead Channel Setting Pacing Amplitude: 2.5 V
Lead Channel Setting Pacing Pulse Width: 0.4 ms
MDC IDC MSMT LEADCHNL RV PACING THRESHOLD PULSEWIDTH: 0.4 ms
MDC IDC MSMT LEADCHNL RV SENSING INTR AMPL: 9.125 mV
MDC IDC SET LEADCHNL RV SENSING SENSITIVITY: 0.3 mV
MDC IDC SET ZONE DETECTION INTERVAL: 200 ms
MDC IDC SET ZONE DETECTION INTERVAL: 460 ms
Zone Setting Detection Interval: 250 ms
Zone Setting Detection Interval: 300 ms

## 2014-05-22 ENCOUNTER — Encounter: Payer: Self-pay | Admitting: Cardiology

## 2014-06-06 ENCOUNTER — Encounter: Payer: Self-pay | Admitting: Cardiology

## 2014-06-12 ENCOUNTER — Encounter: Payer: Self-pay | Admitting: Family Medicine

## 2014-06-12 NOTE — Assessment & Plan Note (Signed)

## 2014-07-27 ENCOUNTER — Encounter (HOSPITAL_COMMUNITY): Payer: Self-pay | Admitting: Internal Medicine

## 2014-08-08 ENCOUNTER — Ambulatory Visit (INDEPENDENT_AMBULATORY_CARE_PROVIDER_SITE_OTHER): Payer: PRIVATE HEALTH INSURANCE | Admitting: *Deleted

## 2014-08-08 ENCOUNTER — Telehealth: Payer: Self-pay | Admitting: Cardiology

## 2014-08-08 DIAGNOSIS — I255 Ischemic cardiomyopathy: Secondary | ICD-10-CM

## 2014-08-08 DIAGNOSIS — I5022 Chronic systolic (congestive) heart failure: Secondary | ICD-10-CM

## 2014-08-08 DIAGNOSIS — I2589 Other forms of chronic ischemic heart disease: Secondary | ICD-10-CM

## 2014-08-08 NOTE — Telephone Encounter (Signed)
Spoke with pt and reminded pt of remote transmission that is due today. Pt verbalized understanding.   

## 2014-08-09 LAB — MDC_IDC_ENUM_SESS_TYPE_REMOTE
Battery Remaining Longevity: 133 mo
Brady Statistic RV Percent Paced: 0.01 %
HIGH POWER IMPEDANCE MEASURED VALUE: 71 Ohm
Lead Channel Impedance Value: 551 Ohm
Lead Channel Pacing Threshold Pulse Width: 0.4 ms
Lead Channel Sensing Intrinsic Amplitude: 6.625 mV
Lead Channel Sensing Intrinsic Amplitude: 6.625 mV
Lead Channel Setting Pacing Amplitude: 2.5 V
Lead Channel Setting Pacing Pulse Width: 0.4 ms
MDC IDC MSMT BATTERY VOLTAGE: 3.04 V
MDC IDC MSMT LEADCHNL RV IMPEDANCE VALUE: 475 Ohm
MDC IDC MSMT LEADCHNL RV PACING THRESHOLD AMPLITUDE: 0.625 V
MDC IDC SESS DTM: 20151223000544
MDC IDC SET LEADCHNL RV SENSING SENSITIVITY: 0.3 mV
MDC IDC SET ZONE DETECTION INTERVAL: 200 ms
MDC IDC SET ZONE DETECTION INTERVAL: 300 ms
MDC IDC SET ZONE DETECTION INTERVAL: 460 ms
Zone Setting Detection Interval: 250 ms

## 2014-08-09 NOTE — Progress Notes (Signed)
Remote ICD transmission.   

## 2014-08-10 ENCOUNTER — Telehealth: Payer: Self-pay | Admitting: *Deleted

## 2014-08-10 MED ORDER — FUROSEMIDE 20 MG PO TABS: 20.0000 mg | ORAL_TABLET | Freq: Every day | ORAL | Status: DC

## 2014-08-10 NOTE — Telephone Encounter (Signed)
Spoke with patient.  She is to start on furosemide 20mg  daily and I will have someone call her Monday 12/28 to schedule an appointment with Richardson Dopp for follow up per Dr. Caryl Comes.

## 2014-08-14 ENCOUNTER — Telehealth: Payer: Self-pay | Admitting: Internal Medicine

## 2014-08-14 NOTE — Telephone Encounter (Signed)
New problem   Pt stated a nurse called her and told her she had fluid around her heart and she need to call office to sched an appt asap w/Dr Caryl Comes. There was an appt scheduled w/Scott for 09/11/13 and pt stated she didn't know anything about this appt. Pt stated she was told to call office today for a soon appt.

## 2014-08-17 NOTE — Telephone Encounter (Signed)
New Prob   Pt is experiencing cold like symptoms and requesting recommendation for OTC medication she can purchase. Please call.

## 2014-08-17 NOTE — Telephone Encounter (Signed)
I spoke with pt & she understands she can take plain Robitussin & Mucinex nothing with DM.  Horton Chin RN

## 2014-08-24 ENCOUNTER — Other Ambulatory Visit: Payer: Self-pay

## 2014-08-24 DIAGNOSIS — E119 Type 2 diabetes mellitus without complications: Secondary | ICD-10-CM

## 2014-08-24 DIAGNOSIS — E785 Hyperlipidemia, unspecified: Secondary | ICD-10-CM

## 2014-08-24 DIAGNOSIS — E559 Vitamin D deficiency, unspecified: Secondary | ICD-10-CM

## 2014-08-24 DIAGNOSIS — I10 Essential (primary) hypertension: Secondary | ICD-10-CM

## 2014-08-28 ENCOUNTER — Telehealth: Payer: Self-pay | Admitting: Family Medicine

## 2014-08-28 ENCOUNTER — Ambulatory Visit (INDEPENDENT_AMBULATORY_CARE_PROVIDER_SITE_OTHER): Payer: Medicare Other | Admitting: Family Medicine

## 2014-08-28 ENCOUNTER — Encounter: Payer: Self-pay | Admitting: Family Medicine

## 2014-08-28 ENCOUNTER — Other Ambulatory Visit (HOSPITAL_COMMUNITY)
Admission: RE | Admit: 2014-08-28 | Discharge: 2014-08-28 | Disposition: A | Discharge status: Home / Self Care | Payer: Medicare Other | Source: Ambulatory Visit | Attending: Family Medicine | Admitting: Family Medicine

## 2014-08-28 VITALS — BP 106/70 | HR 80 | Resp 18 | Ht 67.0 in | Wt 219.1 lb

## 2014-08-28 DIAGNOSIS — R87810 Cervical high risk human papillomavirus (HPV) DNA test positive: Secondary | ICD-10-CM | POA: Insufficient documentation

## 2014-08-28 DIAGNOSIS — Z1211 Encounter for screening for malignant neoplasm of colon: Secondary | ICD-10-CM | POA: Diagnosis not present

## 2014-08-28 DIAGNOSIS — Z0000 Encounter for general adult medical examination without abnormal findings: Secondary | ICD-10-CM | POA: Insufficient documentation

## 2014-08-28 DIAGNOSIS — J42 Unspecified chronic bronchitis: Secondary | ICD-10-CM

## 2014-08-28 DIAGNOSIS — Z01419 Encounter for gynecological examination (general) (routine) without abnormal findings: Secondary | ICD-10-CM | POA: Diagnosis not present

## 2014-08-28 DIAGNOSIS — Z124 Encounter for screening for malignant neoplasm of cervix: Secondary | ICD-10-CM

## 2014-08-28 DIAGNOSIS — J320 Chronic maxillary sinusitis: Secondary | ICD-10-CM

## 2014-08-28 DIAGNOSIS — E785 Hyperlipidemia, unspecified: Secondary | ICD-10-CM

## 2014-08-28 DIAGNOSIS — J40 Bronchitis, not specified as acute or chronic: Secondary | ICD-10-CM | POA: Insufficient documentation

## 2014-08-28 DIAGNOSIS — Z1151 Encounter for screening for human papillomavirus (HPV): Secondary | ICD-10-CM | POA: Insufficient documentation

## 2014-08-28 DIAGNOSIS — E119 Type 2 diabetes mellitus without complications: Secondary | ICD-10-CM | POA: Diagnosis not present

## 2014-08-28 DIAGNOSIS — Z1239 Encounter for other screening for malignant neoplasm of breast: Secondary | ICD-10-CM

## 2014-08-28 LAB — POC HEMOCCULT BLD/STL (OFFICE/1-CARD/DIAGNOSTIC): Fecal Occult Blood, POC: NEGATIVE

## 2014-08-28 MED ORDER — LEVOFLOXACIN 500 MG PO TABS: 500.0000 mg | ORAL_TABLET | Freq: Every day | ORAL | Status: DC

## 2014-08-28 MED ORDER — FLUCONAZOLE 150 MG PO TABS: 150.0000 mg | ORAL_TABLET | Freq: Once | ORAL | Status: DC

## 2014-08-28 MED ORDER — ALPRAZOLAM 0.5 MG PO TABS: 0.5000 mg | ORAL_TABLET | Freq: Every evening | ORAL | Status: DC | PRN

## 2014-08-28 NOTE — Telephone Encounter (Signed)
Pt aware that meds are ready at [facility]

## 2014-08-28 NOTE — Patient Instructions (Addendum)
F/u in 4 month, call if you need me before  Pls come in for flu vaccine in 2 weeks  Medication  is prescribed for head and chest congestion and also fluconazole for yeast infection  Pls get appt for mammogram at checkoout, past due   microalb today, and foot exam   Sorry about your Mom's passing   Fasting lipid, cmop and EGFr, HBa1C, cBc, tSH and Vit D this week  Please work on stopping smoking    

## 2014-08-28 NOTE — Progress Notes (Signed)
   Subjective:    Patient ID: [name], female    DOB: 1965/09/19, 49 y.o.   MRN: [id number]  HPI Patient is in for annual physical exam. 3 week h/o head and chest congestion with fever and chills and discoloured thick sputum Review of Systems See HPI     Objective:   Physical Exam BP 106/70 mmHg  Pulse 80  Resp 18  Ht 5\' 7"  (1.702 m)  Wt 219 lb 1.3 oz (99.374 kg)  BMI 34.30 kg/m2  SpO2 99%  Pleasant  female, alert and oriented x 3, in no cardio-pulmonary distress. Afebrile. HEENT No facial trauma or asymetry. Maxillary Sinuses  tender.  Extra occullar muscles intact, pupils equally reactive to light. External ears normal, tympanic membranes clear. Oropharynx moist, no exudate, fair dentition. Neck: supple, bilateral anterior cervical  adenopathy,JVD or thyromegaly.No bruits.  Chest: Decreased though adequate air entry, bilateral crackles and wheezes Breast: No asymetry,no masses or lumps. No tenderness. No nipple discharge or inversion. No axillary or supraclavicular adenopathy  Cardiovascular system; Heart sounds normal,  S1 and  S2 ,no S3.  No murmur, or thrill. Apical beat not displaced Peripheral pulses normal.  Abdomen: Soft, non tender, no organomegaly or masses. No bruits. Bowel sounds normal. No guarding, tenderness or rebound.  Rectal:  Normal sphincter tone. No mass.No rectal masses.  Guaiac negative stool.  GU: External genitalia normal female genitalia , female distribution of hair. No lesions. Urethral meatus normal in size, no  Prolapse, no lesions visibly  Present. Bladder non tender. Vagina pink and moist , with no visible lesions , physiologic discharge present . Adequate pelvic support no  cystocele or rectocele noted  Uterus absent , no adnexal masses, no  adnexal tenderness.   Musculoskeletal exam: Full ROM of spine, hips , shoulders and knees. No deformity ,swelling or crepitus noted. No muscle wasting or atrophy.    Neurologic: Cranial nerves 2 to 12 intact. Power, tone ,sensation and reflexes normal throughout. No disturbance in gait. No tremor.  Skin: Intact, no ulceration, erythema , scaling or rash noted. Pigmentation normal throughout  Psych; Normal mood and affect. Judgement and concentration normal        Assessment & Plan:  Annual physical exam Annual exam as documented. Counseling done  re healthy lifestyle involving commitment to 150 minutes exercise per week, heart healthy diet, and attaining healthy weight.The importance of adequate sleep also discussed. Regular seat belt use and home safety, is also discussed. Changes in health habits are decided on by the patient with goals and time frames  set for achieving them. Immunization and cancer screening needs are specifically addressed at this visit.    Chronic bronchitis Antibiotic and decongestant prescribed   Maxillary sinusitis, chronic antibiotic prescribed

## 2014-08-29 LAB — MICROALBUMIN / CREATININE URINE RATIO
Creatinine, Urine: 49.6 mg/dL
Microalb, Ur: <0.2 mg/dL (ref <2.0)

## 2014-08-29 LAB — CYTOLOGY - PAP

## 2014-08-31 ENCOUNTER — Encounter: Payer: Self-pay | Admitting: Internal Medicine

## 2014-08-31 ENCOUNTER — Ambulatory Visit (INDEPENDENT_AMBULATORY_CARE_PROVIDER_SITE_OTHER): Payer: 59 | Admitting: Internal Medicine

## 2014-08-31 VITALS — BP 100/78 | HR 85 | Ht 67.0 in | Wt 218.5 lb

## 2014-08-31 DIAGNOSIS — I5022 Chronic systolic (congestive) heart failure: Secondary | ICD-10-CM

## 2014-08-31 DIAGNOSIS — Z4502 Encounter for adjustment and management of automatic implantable cardiac defibrillator: Secondary | ICD-10-CM | POA: Diagnosis not present

## 2014-08-31 DIAGNOSIS — I472 Ventricular tachycardia: Secondary | ICD-10-CM

## 2014-08-31 DIAGNOSIS — I4729 Other ventricular tachycardia: Secondary | ICD-10-CM

## 2014-08-31 DIAGNOSIS — R400 Somnolence: Secondary | ICD-10-CM

## 2014-08-31 DIAGNOSIS — I4901 Ventricular fibrillation: Secondary | ICD-10-CM

## 2014-08-31 DIAGNOSIS — I255 Ischemic cardiomyopathy: Secondary | ICD-10-CM

## 2014-08-31 DIAGNOSIS — G4710 Hypersomnia, unspecified: Secondary | ICD-10-CM

## 2014-08-31 LAB — MDC_IDC_ENUM_SESS_TYPE_INCLINIC
Battery Remaining Longevity: 133 mo
Battery Voltage: 3.03 V
Brady Statistic RV Percent Paced: 0.01 %
Date Time Interrogation Session: 20160114113730
HighPow Impedance: 190 Ohm
HighPow Impedance: 80 Ohm
Lead Channel Impedance Value: 589 Ohm
Lead Channel Pacing Threshold Amplitude: 0.625 V
Lead Channel Pacing Threshold Pulse Width: 0.4 ms
Lead Channel Sensing Intrinsic Amplitude: 7.5 mV
Lead Channel Sensing Intrinsic Amplitude: 7.75 mV
Lead Channel Setting Pacing Amplitude: 2.5 V
Lead Channel Setting Pacing Pulse Width: 0.4 ms
Lead Channel Setting Sensing Sensitivity: 0.3 mV
MDC IDC SET ZONE DETECTION INTERVAL: 200 ms
MDC IDC SET ZONE DETECTION INTERVAL: 250 ms
MDC IDC SET ZONE DETECTION INTERVAL: 460 ms
Zone Setting Detection Interval: 300 ms

## 2014-08-31 MED ORDER — CLOPIDOGREL BISULFATE 75 MG PO TABS: 75.0000 mg | ORAL_TABLET | Freq: Every day | ORAL | Status: DC

## 2014-08-31 MED ORDER — CARVEDILOL 3.125 MG PO TABS: 3.1250 mg | ORAL_TABLET | Freq: Two times a day (BID) | ORAL | Status: DC

## 2014-08-31 NOTE — Progress Notes (Signed)
Electrophysiology Office Note   Date:  08/31/2014   ID:  [name], DOB 1966-07-29, MRN [id number]  PCP:  Rachel Nakayama, MD  Cardiologist:   Primary Electrophysiologist:    No chief complaint on file.    History of Present Illness: [name] is a 49 y.o. female who presents today for electrophysiology evaluation.     ptoms suggestive of adhesive capsulitis. This is significantly improved.  She denies chest pain or shortness of breath. She remains in school.   His history and ICD implanted for primary prevention and 10/14 underwent generator replacement with revision of her 6949-lead.   Recent nuclear imaging (3/11) listed under NOTES-CV procedure--demonstrated ejection fraction of 30% with an infarct and no ischemia  she is a prior inferior wall MI he treated with stenting.  Echocardiogram 10/14 demonstrated "severe depressed LV function" furthermore there is "patient refused to complete exam"   Today, she denies symptoms of palpitations, chest pain, shortness of breath, orthopnea, PND, lower extremity edema, claudication, dizziness, presyncope, syncope, bleeding, or neurologic sequela. The patient is tolerating medications without difficulties and is otherwise without complaint today.     She has run out of her Plavix and her carvedilol.   She has daytime somnolence and sleep disordered breathing with significant fatigue.   Past Medical History  Diagnosis Date  . coronary artery disease     RCA stenting Myoview 2011 EF 30% infarction dilatation without ischemia  . Systolic CHF   . Automatic implantable cardiac defibrillator in situ     a. s/p prior Medtronic ICD with 6949 lead. b. s/p Gen change & lead revision 05/2013.  Marland Kitchen Other and unspecified hyperlipidemia   . Cardiomyopathy secondary     Patient has cardiomyopathy out of proportion to her ischemic heart disease  . Depression   . GERD (gastroesophageal reflux disease)   . Nicotine abuse   .  Insomnia   . Migraines   . Lupus (systemic lupus erythematosus)   . Arthritis of knee   . Myocardial infarction     8 total   . Diabetes mellitus   . VF (ventricular fibrillation)     a. Hx appropriate ICD therapy for VF.  Marland Kitchen Paroxysmal VT     Current Outpatient Prescriptions  Medication Sig Dispense Refill  . albuterol (PROVENTIL HFA;VENTOLIN HFA) 108 (90 BASE) MCG/ACT inhaler Inhale 2 puffs into the lungs every 2 (two) hours as needed for wheezing or shortness of breath (cough). 1 Inhaler 0  . ALPRAZolam (XANAX) 0.5 MG tablet Take 1 tablet (0.5 mg total) by mouth at bedtime as needed for anxiety. As directed 30 tablet 3  . aspirin 81 MG tablet Take 1 tablet (81 mg total) by mouth daily. 30 tablet 6  . Aspirin-Acetaminophen (GOODYS BODY PAIN PO) Take 1 Package by mouth daily as needed. For pain    . atorvastatin (LIPITOR) 20 MG tablet 40 mg daily.    . carvedilol (COREG) 3.125 MG tablet Take 1 tablet (3.125 mg total) by mouth 2 (two) times daily. 60 tablet 12  . clopidogrel (PLAVIX) 75 MG tablet 75 mg daily.    . fenofibrate (TRICOR) 145 MG tablet Take by mouth.    . oxyCODONE (ROXICODONE) 15 MG immediate release tablet Take 30 mg by mouth every 8 (eight) hours.     No current facility-administered medications for this visit.    Allergies:   Bee venom and Cortisone   Social History:  The patient  reports that she has been smoking  Cigarettes.  She has a 5.25 pack-year smoking history. She has never used smokeless tobacco. She reports that she drinks alcohol. She reports that she does not use illicit drugs.   Family History:  The patient's family history includes Cancer in her mother; Dementia in her father; Diabetes in her brother, mother, and sister; HIV in her sister; Heart disease in her brother and mother; Hepatitis (age of onset: 57) in her mother; Hyperlipidemia in her brother, mother, and sister; Liver cancer (age of onset: 52) in her mother; Stroke (age of onset: 31) in her  sister.    ROS:  Please see the history of present illness.   Positive for ,   All other systems are reviewed and negative.    PHYSICAL EXAM: VS:  BP 100/78 mmHg  Pulse 85  Ht 5\' 7"  (1.702 m)  Wt 218 lb 8 oz (99.111 kg)  BMI 34.21 kg/m2 , BMI Body mass index is 34.21 kg/(m^2). GEN: Well nourished, well developed, in no acute distress HEENT: normal Neck: no JVD, carotid bruits, or masses Cardiac: RRR; no murmurs, rubs, or gallops,no edema  Respiratory:  clear to auscultation bilaterally, normal work of breathing GI: soft, nontender, nondistended, + BS MS: no deformity or atrophy Skin: warm and dry,  device pocket is well healed Neuro:  Strength and sensation are intact Psych: euthymic mood, full affect  EKG:  EKG is ordered today. The ekg ordered today shows Sinus rhythm at 85. Intervals 15/13/43  axis LXIV Nonspecific T-wave changes.   Device interrogation is reviewed today in detail.  See PaceArt for details.  Recent Labs: No results found for requested labs within last 365 days.  No results found for requested labs within last 365 days.     CrCl cannot be calculated (Patient has no serum creatinine result on file.).   Wt Readings from Last 3 Encounters:  08/31/14 218 lb 8 oz (99.111 kg)  08/28/14 219 lb 1.3 oz (99.374 kg)  04/27/14 214 lb 8 oz (97.297 kg)     Other studies Reviewed:   ASSESSMENT AND PLAN:     Ischemic/nonischemic cardio myopathy  Implantable defibrillator-Medtronic S./P. revision    Adhesive capsulitis  Ventricular tachycardia  Congestive heart failure-chronic-systolic  Sleep disordered breathing.  Without symptoms of ischemia  Euvolemic continue current meds  She has had nterval ventricular tachycardia in her monitor zone. It was asymptomatic.  She has significant fatigue and sleep disordered breathing. We will undertake a sleep study.   will follow this along. Her shoulder is much improved.    Current medicines are  reviewed at length with the patient today.  The patient is with any concerns regarding medicines and no changes are made today.  I stressed with her the importance of her Plavix and her carvedilol the former for short-term risk and the latter for his long-term benefit   Disposition:   FU with  Me in 12 months   Signed, Virl Axe   08/31/2014 9:31 AM      [facility] [address] [place] [address]  [phone] (office) [phone] (fax)

## 2014-08-31 NOTE — Patient Instructions (Addendum)
Your physician recommends that you continue on your current medications as directed. Please refer to the Current Medication list given to you today.  Your physician has recommended that you have a sleep study. This test records several body functions during sleep, including: brain activity, eye movement, oxygen and carbon dioxide blood levels, heart rate and rhythm, breathing rate and rhythm, the flow of air through your mouth and nose, snoring, body muscle movements, and chest and belly movement.   Remote monitoring is used to monitor your ICD from home. This monitoring reduces the number of office visits required to check your device to one time per year. It allows [place] to keep an eye on the functioning of your device to ensure it is working properly. You are scheduled for a device check from home on 11-30-2014. You may send your transmission at any time that day. If you have a wireless device, the transmission will be sent automatically. After your physician reviews your transmission, you will receive a postcard with your next transmission date.  Your physician recommends that you schedule a follow-up appointment in: 12 months with Dr.Klein

## 2014-09-01 ENCOUNTER — Ambulatory Visit
Admission: RE | Admit: 2014-09-01 | Discharge: 2014-09-01 | Disposition: A | Discharge status: Home / Self Care | Payer: Medicare Other | Source: Ambulatory Visit | Attending: Family Medicine | Admitting: Family Medicine

## 2014-09-01 DIAGNOSIS — Z1231 Encounter for screening mammogram for malignant neoplasm of breast: Secondary | ICD-10-CM | POA: Diagnosis not present

## 2014-09-01 DIAGNOSIS — Z1239 Encounter for other screening for malignant neoplasm of breast: Secondary | ICD-10-CM

## 2014-09-10 DIAGNOSIS — J320 Chronic maxillary sinusitis: Secondary | ICD-10-CM | POA: Insufficient documentation

## 2014-09-10 NOTE — Assessment & Plan Note (Signed)

## 2014-09-10 NOTE — Assessment & Plan Note (Signed)
Antibiotic and decongestant prescribed 

## 2014-09-10 NOTE — Assessment & Plan Note (Signed)
antibiotic prescribed 

## 2014-09-11 ENCOUNTER — Ambulatory Visit: Payer: PRIVATE HEALTH INSURANCE | Admitting: Physician Assistant

## 2014-10-04 ENCOUNTER — Telehealth: Payer: Self-pay | Admitting: Internal Medicine

## 2014-10-04 NOTE — Telephone Encounter (Signed)
No cardiac reason not to proceed with teeth

## 2014-10-04 NOTE — Telephone Encounter (Signed)
New message     What dental office are you calling from?  1. What is your office phone and fax number?  Pt did not know dentist name or number  2. What type of procedure is the patient having performed? All bottom teeth pulled  3. What date is procedure scheduled? Not scheduled  4. What is your question (ex. Antibiotics prior to procedure, holding medication-we need to know how long dentist wants pt to hold med)? Will it be ok heartwise to have all of her bottom teeth pulled

## 2014-10-05 NOTE — Telephone Encounter (Signed)
Patient advised and verbalized understanding of recommendations.

## 2014-10-07 ENCOUNTER — Ambulatory Visit (INDEPENDENT_AMBULATORY_CARE_PROVIDER_SITE_OTHER): Payer: Medicare Other | Admitting: Emergency Medicine

## 2014-10-07 ENCOUNTER — Ambulatory Visit (INDEPENDENT_AMBULATORY_CARE_PROVIDER_SITE_OTHER): Payer: Medicare Other

## 2014-10-07 VITALS — BP 98/68 | HR 80 | Temp 97.7°F | Resp 24 | Ht 67.0 in | Wt 212.5 lb

## 2014-10-07 DIAGNOSIS — J029 Acute pharyngitis, unspecified: Secondary | ICD-10-CM | POA: Diagnosis not present

## 2014-10-07 DIAGNOSIS — R05 Cough: Secondary | ICD-10-CM

## 2014-10-07 DIAGNOSIS — R059 Cough, unspecified: Secondary | ICD-10-CM

## 2014-10-07 LAB — POCT CBC
Granulocyte percent: 52.2 %G (ref 37–80)
HEMATOCRIT: 41.8 % (ref 37.7–47.9)
HEMOGLOBIN: 13.4 g/dL (ref 12.2–16.2)
LYMPH, POC: 3.2 (ref 0.6–3.4)
MCH, POC: 26.9 pg — AB (ref 27–31.2)
MCHC: 32 g/dL (ref 31.8–35.4)
MCV: 83.9 fL (ref 80–97)
MID (cbc): 0.3 (ref 0–0.9)
MPV: 7.6 fL (ref 0–99.8)
POC Granulocyte: 3.8 (ref 2–6.9)
POC LYMPH %: 43.8 % (ref 10–50)
POC MID %: 4 %M (ref 0–12)
Platelet Count, POC: 303 10*3/uL (ref 142–424)
RBC: 4.99 M/uL (ref 4.04–5.48)
RDW, POC: 13.7 %
WBC: 7.3 10*3/uL (ref 4.6–10.2)

## 2014-10-07 LAB — POCT RAPID STREP A (OFFICE): Rapid Strep A Screen: NEGATIVE

## 2014-10-07 MED ORDER — AMOXICILLIN 875 MG PO TABS: 875.0000 mg | ORAL_TABLET | Freq: Two times a day (BID) | ORAL | Status: DC

## 2014-10-07 MED ORDER — FLUCONAZOLE 150 MG PO TABS: 150.0000 mg | ORAL_TABLET | Freq: Once | ORAL | Status: DC

## 2014-10-07 MED ORDER — BENZONATATE 100 MG PO CAPS: 100.0000 mg | ORAL_CAPSULE | Freq: Three times a day (TID) | ORAL | Status: DC | PRN

## 2014-10-07 NOTE — Progress Notes (Addendum)
Subjective:  This chart was scribed for Nena Jordan, MD by Dellis Filbert, ED Scribe at [facility].The patient was seen in exam room 10 and the patient's care was started at 3:21 PM.   Patient ID: [name], female    DOB: 1966-03-20, 49 y.o.   MRN: [id number] Chief Complaint  Patient presents with  . Cough  . Sore Throat   Cough Associated symptoms include a fever and a sore throat.  Sore Throat  Associated symptoms include coughing and vomiting.   HPI Comments: [name] is a 49 y.o. female who presents to [facility] complaining of a sore throat, primarily on the right side. Woke up this morning and had one episode of vomiting, pt is coughing "alot". She also has an intermittent fever. Pt has lupus and the medicine she has taken discolored her teeth. Pt is not currently on any medication for lupus. She has a defibrillator  for CHF managed by Domenick Bookbinder.   Patient Active Problem List   Diagnosis Date Noted  . Maxillary sinusitis, chronic 09/10/2014  . Annual physical exam 08/28/2014  . Chronic bronchitis 08/28/2014  . Medicare annual wellness visit, initial 03/14/2014  . Obesity (BMI 30.0-34.9) 12/04/2013  . Metabolic syndrome X 71/24/5809  . Dermatitis 11/08/2013  . Adhesive capsulitis of left shoulder 09/26/2013  . Recurrent hematuria syndrome 07/16/2013  . Ventricular tachycardia (paroxysmal) 12/17/2012  . Nicotine dependence 11/07/2012  . HSV-2 seropositive 11/05/2012  . H/O abnormal Pap smear 11/02/2012  . Fatty liver 08/10/2012  . HTN, goal below 130/80 09/13/2011  . Hematochezia 06/24/2011  . Ischemic cardiomyopathy 05/27/2011  . Automatic implantable cardiac defibrillator--Medtronic 05/27/2011  . 6949 defibrillator lead 05/27/2011  . FATIGUE 06/05/2009  . VENTRICULAR FIBRILLATION 02/04/2009  . OBESITY 12/20/2008  . Diabetes mellitus without complication 98/33/8250  . Hyperlipidemia LDL goal <100 08/31/2007  . ANXIETY 08/31/2007  . GERD  08/31/2007  . SLE 08/31/2007  . ARTHRITIS, KNEES, BILATERAL 08/31/2007  . AVASCULAR NECROSIS 08/31/2007  . INSOMNIA 08/31/2007  . MIGRAINES, HX OF 08/31/2007   Past Medical History  Diagnosis Date  . coronary artery disease     RCA stenting Myoview 2011 EF 30% infarction dilatation without ischemia  . Systolic CHF   . Automatic implantable cardiac defibrillator in situ     a. s/p prior Medtronic ICD with 6949 lead. b. s/p Gen change & lead revision 05/2013.  Marland Kitchen Other and unspecified hyperlipidemia   . Cardiomyopathy secondary     Patient has cardiomyopathy out of proportion to her ischemic heart disease  . Depression   . GERD (gastroesophageal reflux disease)   . Nicotine abuse   . Insomnia   . Migraines   . Lupus (systemic lupus erythematosus)   . Arthritis of knee   . Myocardial infarction     8 total   . Diabetes mellitus   . VF (ventricular fibrillation)     a. Hx appropriate ICD therapy for VF.  Marland Kitchen Paroxysmal VT    Past Surgical History  Procedure Laterality Date  . Cholecystectomy    . Abdominal hysterectomy    . Rotator cuff repair Right 2002  . Defibrillator placed   2009 and 05/2013  . Colonoscopy  07/15/2011    SLF: internal hemorrhoids/hyperplastic polyps in the rectum/tubular adenomaSURVEILLANCE Nov 2017  . Icd generator change  06/17/2013    Dr Caryl Comes  . Lead revision N/A 06/17/2013    Procedure: LEAD REVISION;  Surgeon: Deboraha Sprang, MD;  Location: [facility] CATH LAB;  Service: Cardiovascular;  Laterality: N/A;  . Implantable cardioverter defibrillator (icd) generator change Left 06/17/2013    Procedure: ICD GENERATOR CHANGE;  Surgeon: Deboraha Sprang, MD;  Location: [facility] CATH LAB;  Service: Cardiovascular;  Laterality: Left;   Allergies  Allergen Reactions  . Bee Venom Anaphylaxis  . Cortisone Swelling   Prior to Admission medications   Medication Sig Start Date End Date Taking? Authorizing Provider  albuterol (PROVENTIL HFA;VENTOLIN HFA) 108 (90 BASE) MCG/ACT  inhaler Inhale 2 puffs into the lungs every 2 (two) hours as needed for wheezing or shortness of breath (cough). 25/4/27  Yes Delora Fuel, MD  ALPRAZolam Duanne Moron) 0.5 MG tablet Take 1 tablet (0.5 mg total) by mouth at bedtime as needed for anxiety. As directed 08/28/14  Yes Fayrene Helper, MD  aspirin 81 MG tablet Take 1 tablet (81 mg total) by mouth daily. 09/26/13  Yes Deboraha Sprang, MD  Aspirin-Acetaminophen (GOODYS BODY PAIN PO) Take 1 Package by mouth daily as needed. For pain   Yes Historical Provider, MD  atorvastatin (LIPITOR) 20 MG tablet 40 mg daily.   Yes Historical Provider, MD  carvedilol (COREG) 3.125 MG tablet Take 1 tablet (3.125 mg total) by mouth 2 (two) times daily. 08/31/14  Yes Deboraha Sprang, MD  clopidogrel (PLAVIX) 75 MG tablet Take 1 tablet (75 mg total) by mouth daily. 08/31/14  Yes Deboraha Sprang, MD  fenofibrate (TRICOR) 145 MG tablet Take by mouth. 04/15/13  Yes Historical Provider, MD  oxyCODONE (ROXICODONE) 15 MG immediate release tablet Take 30 mg by mouth every 8 (eight) hours.   Yes Historical Provider, MD   History   Social History  . Marital Status: Single    Spouse Name: N/A  . Number of Children: 3  . Years of Education: N/A   Occupational History  . disabled    Social History Main Topics  . Smoking status: Current Every Day Smoker -- 0.75 packs/day for 7 years    Types: Cigarettes  . Smokeless tobacco: Never Used  . Alcohol Use: Yes     Comment: occasionaly  . Drug Use: No  . Sexual Activity: Not Currently    Birth Control/ Protection: Surgical   Other Topics Concern  . Not on file   Social History Narrative   Review of Systems  Constitutional: Positive for fever.  HENT: Positive for dental problem and sore throat.   Respiratory: Positive for cough.   Gastrointestinal: Positive for vomiting.      Objective:  BP 98/68 mmHg  Pulse 80  Temp(Src) 97.7 F (36.5 C) (Oral)  Resp 24  Ht 5\' 7"  (1.702 m)  Wt 212 lb 8 oz (96.389 kg)  BMI  33.27 kg/m2  SpO2 96%  Physical Exam  Constitutional: She is oriented to person, place, and time. She appears well-developed and well-nourished. No distress.  HENT:  Head: Normocephalic and atraumatic.  Right Ear: Tympanic membrane normal.  Left Ear: Tympanic membrane normal.  Throat is red with minimal drainage. Loss of all the enamel of her lower teeth.  Eyes: Pupils are equal, round, and reactive to light.  Neck: Normal range of motion.  Cardiovascular: Normal rate and regular rhythm.   Pulmonary/Chest: Effort normal. No respiratory distress.  Implanted defibrillator.   Musculoskeletal: Normal range of motion.  Neurological: She is alert and oriented to person, place, and time.  Skin: Skin is warm and dry.  Psychiatric: She has a normal mood and affect. Her behavior is  normal.  Nursing note and vitals reviewed.  Results for orders placed or performed in visit on 10/07/14  POCT CBC  Result Value Ref Range   WBC 7.3 4.6 - 10.2 K/uL   Lymph, poc 3.2 0.6 - 3.4   POC LYMPH PERCENT 43.8 10 - 50 %L   MID (cbc) 0.3 0 - 0.9   POC MID % 4.0 0 - 12 %M   POC Granulocyte 3.8 2 - 6.9   Granulocyte percent 52.2 37 - 80 %G   RBC 4.99 4.04 - 5.48 M/uL   Hemoglobin 13.4 12.2 - 16.2 g/dL   HCT, POC 41.8 37.7 - 47.9 %   MCV 83.9 80 - 97 fL   MCH, POC 26.9 (A) 27 - 31.2 pg   MCHC 32.0 31.8 - 35.4 g/dL   RDW, POC 13.7 %   Platelet Count, POC 303 142 - 424 K/uL   MPV 7.6 0 - 99.8 fL  POCT rapid strep A  Result Value Ref Range   Rapid Strep A Screen Negative Negative   [facility] reading (PRIMARY) by  Dr. Everlene Farrier defibrillator is present no acute lung disease.     Assessment & Plan:  Patient has severe sore throat. Throat culture was done. We'll cover with amoxicillin 875 twice a day. She will have Tessalon Perles for cough. She was also given Diflucan because of her history of yeast with antibiotics.I personally performed the services described in this documentation, which was scribed in my  presence. The recorded information has been reviewed and is accurate.   *

## 2014-10-07 NOTE — Patient Instructions (Signed)
Take the antibiotics as prescribed. Do not take the atorvastatin for 3 days after taking the Diflucan.

## 2014-10-10 DIAGNOSIS — H01004 Unspecified blepharitis left upper eyelid: Secondary | ICD-10-CM | POA: Diagnosis not present

## 2014-10-10 DIAGNOSIS — H268 Other specified cataract: Secondary | ICD-10-CM | POA: Diagnosis not present

## 2014-10-10 DIAGNOSIS — F17200 Nicotine dependence, unspecified, uncomplicated: Secondary | ICD-10-CM | POA: Diagnosis not present

## 2014-10-10 DIAGNOSIS — H40003 Preglaucoma, unspecified, bilateral: Secondary | ICD-10-CM | POA: Diagnosis not present

## 2014-10-10 DIAGNOSIS — Z79899 Other long term (current) drug therapy: Secondary | ICD-10-CM | POA: Diagnosis not present

## 2014-10-10 DIAGNOSIS — H01005 Unspecified blepharitis left lower eyelid: Secondary | ICD-10-CM | POA: Diagnosis not present

## 2014-10-10 DIAGNOSIS — M329 Systemic lupus erythematosus, unspecified: Secondary | ICD-10-CM | POA: Diagnosis not present

## 2014-10-10 DIAGNOSIS — H01002 Unspecified blepharitis right lower eyelid: Secondary | ICD-10-CM | POA: Diagnosis not present

## 2014-10-10 DIAGNOSIS — H01001 Unspecified blepharitis right upper eyelid: Secondary | ICD-10-CM | POA: Diagnosis not present

## 2014-10-10 DIAGNOSIS — H2513 Age-related nuclear cataract, bilateral: Secondary | ICD-10-CM | POA: Diagnosis not present

## 2014-10-10 LAB — CULTURE, GROUP A STREP: Organism ID, Bacteria: NORMAL

## 2014-10-17 ENCOUNTER — Telehealth: Payer: Self-pay | Admitting: Internal Medicine

## 2014-10-17 NOTE — Telephone Encounter (Signed)
New message     Pt dropped a form off yesterday from the dentist office.  Has it been completed?  Please call pt

## 2014-10-17 NOTE — Telephone Encounter (Signed)
Was unable to return patient's call -- she showed up to office to ask same question. Informed greeter to tell patient that Dr. Caryl Comes has not addressed and I will have him look at it today.

## 2014-10-18 NOTE — Telephone Encounter (Signed)
Faxed clearance to [facility] for 10 extractions. Order to hold ASA/Plavix 5 days prior and 3 days following procedure. Advised patient did not need to be pre-medicated.

## 2014-11-08 ENCOUNTER — Ambulatory Visit (HOSPITAL_BASED_OUTPATIENT_CLINIC_OR_DEPARTMENT_OTHER): Payer: Medicare Other | Attending: Internal Medicine | Admitting: *Deleted

## 2014-11-09 NOTE — Sleep Study (Unsigned)
The patient arrived in the sleep center on time for study. Patient was somewhat unhappy to be here and blames her daughter for making her do this study. She was upset she would not be able to leave and go smoke outside once she was in her room.  She was told to go out and have one before being checked in if she felt she needed to do so. She did and was then shown to her room. She started stating she wouldn't be going to bed at 9 pm she doesn't go to bed that early. She stated she goes to bed at 3 am most every night because she is in school and she is working on her computer all night. She also stated we wouldn't be putting anything in her hair. The tech explained the procedure and the wires and necessary placement on the scalp.  The patient stated you actually can't put those on me because of my hair. Tech asked permission to check her hair and it was a weave type style. Her hair was wrapped down her scull with a gel and then a weave with mesh covering the entire scalp area. No scalp was visible. And mesh and hair would have to be cut to reach scalp. Also noted were her finger nails. She had long acrylic nails on her fingers. Patient was informed that we would have to reschedule her study d/t the hairstyle. Tech educated patient on what to expect if she returns and how to have her hair and nails for a sleep study. Patient would like to be contacted for reschedule.

## 2014-11-20 ENCOUNTER — Encounter: Payer: Self-pay | Admitting: Internal Medicine

## 2014-11-23 ENCOUNTER — Ambulatory Visit (HOSPITAL_BASED_OUTPATIENT_CLINIC_OR_DEPARTMENT_OTHER): Payer: Medicare Other

## 2014-11-30 ENCOUNTER — Ambulatory Visit (INDEPENDENT_AMBULATORY_CARE_PROVIDER_SITE_OTHER): Payer: Medicare Other | Admitting: *Deleted

## 2014-11-30 ENCOUNTER — Telehealth: Payer: Self-pay | Admitting: Cardiology

## 2014-11-30 DIAGNOSIS — I5022 Chronic systolic (congestive) heart failure: Secondary | ICD-10-CM | POA: Diagnosis not present

## 2014-11-30 DIAGNOSIS — I255 Ischemic cardiomyopathy: Secondary | ICD-10-CM

## 2014-11-30 NOTE — Telephone Encounter (Signed)
Spoke with pt and reminded pt of remote transmission that is due today. Pt verbalized understanding.   

## 2014-11-30 NOTE — Progress Notes (Signed)
Remote ICD transmission.   

## 2014-12-10 LAB — MDC_IDC_ENUM_SESS_TYPE_REMOTE
Battery Remaining Longevity: 131 mo
Battery Voltage: 3.03 V
HIGH POWER IMPEDANCE MEASURED VALUE: 80 Ohm
Lead Channel Sensing Intrinsic Amplitude: 8.25 mV
Lead Channel Setting Pacing Amplitude: 2.5 V
Lead Channel Setting Pacing Pulse Width: 0.4 ms
Lead Channel Setting Sensing Sensitivity: 0.3 mV
MDC IDC MSMT LEADCHNL RV IMPEDANCE VALUE: 532 Ohm
MDC IDC MSMT LEADCHNL RV IMPEDANCE VALUE: 589 Ohm
MDC IDC MSMT LEADCHNL RV PACING THRESHOLD AMPLITUDE: 0.625 V
MDC IDC MSMT LEADCHNL RV PACING THRESHOLD PULSEWIDTH: 0.4 ms
MDC IDC SESS DTM: 20160414194442
MDC IDC SET ZONE DETECTION INTERVAL: 250 ms
MDC IDC SET ZONE DETECTION INTERVAL: 460 ms
MDC IDC STAT BRADY RV PERCENT PACED: 0.01 %
Zone Setting Detection Interval: 200 ms
Zone Setting Detection Interval: 300 ms

## 2014-12-27 ENCOUNTER — Ambulatory Visit: Payer: 59 | Admitting: Family Medicine

## 2014-12-27 ENCOUNTER — Encounter: Payer: Self-pay | Admitting: *Deleted

## 2015-01-05 ENCOUNTER — Encounter: Payer: Self-pay | Admitting: Cardiology

## 2015-01-08 ENCOUNTER — Telehealth: Payer: Self-pay | Admitting: *Deleted

## 2015-01-08 NOTE — Telephone Encounter (Signed)
Sent her order to the lab

## 2015-01-08 NOTE — Telephone Encounter (Signed)
Pt scheduled a appt for June, pt is requesting her lab order pt states she she got the last one her mom died and she can't remember what she did with it. Please advise

## 2015-01-09 ENCOUNTER — Encounter: Payer: Self-pay | Admitting: Cardiology

## 2015-01-11 ENCOUNTER — Encounter: Payer: Self-pay | Admitting: Internal Medicine

## 2015-01-24 ENCOUNTER — Encounter: Payer: Self-pay | Admitting: Cardiology

## 2015-01-31 ENCOUNTER — Encounter (HOSPITAL_BASED_OUTPATIENT_CLINIC_OR_DEPARTMENT_OTHER): Payer: Medicare Other

## 2015-02-01 ENCOUNTER — Encounter: Payer: Self-pay | Admitting: Family Medicine

## 2015-02-01 ENCOUNTER — Ambulatory Visit (INDEPENDENT_AMBULATORY_CARE_PROVIDER_SITE_OTHER): Payer: Medicare Other | Admitting: Family Medicine

## 2015-02-01 VITALS — BP 118/80 | HR 87 | Resp 16 | Ht 67.0 in | Wt 210.8 lb

## 2015-02-01 DIAGNOSIS — I10 Essential (primary) hypertension: Secondary | ICD-10-CM

## 2015-02-01 DIAGNOSIS — F4329 Adjustment disorder with other symptoms: Secondary | ICD-10-CM

## 2015-02-01 DIAGNOSIS — F4321 Adjustment disorder with depressed mood: Secondary | ICD-10-CM

## 2015-02-01 DIAGNOSIS — E119 Type 2 diabetes mellitus without complications: Secondary | ICD-10-CM | POA: Diagnosis not present

## 2015-02-01 DIAGNOSIS — E785 Hyperlipidemia, unspecified: Secondary | ICD-10-CM | POA: Diagnosis not present

## 2015-02-01 DIAGNOSIS — F17218 Nicotine dependence, cigarettes, with other nicotine-induced disorders: Secondary | ICD-10-CM

## 2015-02-01 DIAGNOSIS — F341 Dysthymic disorder: Secondary | ICD-10-CM | POA: Diagnosis not present

## 2015-02-01 DIAGNOSIS — E118 Type 2 diabetes mellitus with unspecified complications: Secondary | ICD-10-CM

## 2015-02-01 DIAGNOSIS — I4729 Other ventricular tachycardia: Secondary | ICD-10-CM

## 2015-02-01 DIAGNOSIS — F418 Other specified anxiety disorders: Secondary | ICD-10-CM

## 2015-02-01 DIAGNOSIS — F4381 Prolonged grief disorder: Secondary | ICD-10-CM

## 2015-02-01 DIAGNOSIS — E559 Vitamin D deficiency, unspecified: Secondary | ICD-10-CM | POA: Diagnosis not present

## 2015-02-01 DIAGNOSIS — I472 Ventricular tachycardia, unspecified: Secondary | ICD-10-CM

## 2015-02-01 DIAGNOSIS — F5105 Insomnia due to other mental disorder: Secondary | ICD-10-CM

## 2015-02-01 LAB — LIPID PANEL
Cholesterol: 164 mg/dL (ref 0–200)
HDL: 31 mg/dL — ABNORMAL LOW (ref >=46)
LDL CALC: 113 mg/dL — AB (ref 0–99)
TRIGLYCERIDES: 98 mg/dL (ref <150)
Total CHOL/HDL Ratio: 5.3 Ratio
VLDL: 20 mg/dL (ref 0–40)

## 2015-02-01 LAB — COMPLETE METABOLIC PANEL WITH GFR
ALT: 13 U/L (ref 0–35)
AST: 18 U/L (ref 0–37)
Albumin: 3.7 g/dL (ref 3.5–5.2)
Alkaline Phosphatase: 98 U/L (ref 39–117)
BILIRUBIN TOTAL: 0.7 mg/dL (ref 0.2–1.2)
BUN: 6 mg/dL (ref 6–23)
CO2: 26 meq/L (ref 19–32)
CREATININE: 0.59 mg/dL (ref 0.50–1.10)
Calcium: 9 mg/dL (ref 8.4–10.5)
Chloride: 108 mEq/L (ref 96–112)
Glucose, Bld: 106 mg/dL — ABNORMAL HIGH (ref 70–99)
Potassium: 3.8 mEq/L (ref 3.5–5.3)
Sodium: 143 mEq/L (ref 135–145)
Total Protein: 7.2 g/dL (ref 6.0–8.3)

## 2015-02-01 MED ORDER — FLUOXETINE HCL (PMDD) 20 MG PO CAPS: 20.0000 mg | ORAL_CAPSULE | Freq: Every day | ORAL | Status: DC

## 2015-02-01 MED ORDER — ALPRAZOLAM 0.5 MG PO TABS: 0.5000 mg | ORAL_TABLET | Freq: Every evening | ORAL | Status: DC | PRN

## 2015-02-01 MED ORDER — TEMAZEPAM 30 MG PO CAPS: 30.0000 mg | ORAL_CAPSULE | Freq: Every evening | ORAL | Status: DC | PRN

## 2015-02-01 NOTE — Patient Instructions (Addendum)
F/u in 2 month, call if you need me before  New medications for depression, anxiety and poor sleep, fluoxetine, xanax and restoril  Labs today  I am sorry for your loss, and with time and help you will feel better  Please work on reducing cigarettes  Please work on good  health habits so that your health will improve. 1. Commitment to daily physical activity for 30 to 60  minutes, if you are able to do this.  2. Commitment to wise food choices. Aim for half of your  food intake to be vegetable and fruit, one quarter starchy foods, and one quarter protein. Try to eat on a regular schedule  3 meals per day, snacking between meals should be limited to vegetables or fruits or small portions of nuts. 64 ounces of water per day is generally recommended, unless you have specific health conditions, like heart failure or kidney failure where you will need to limit fluid intake.  3. Commitment to sufficient and a  good quality of physical and mental rest daily, generally between 6 to 8 hours per day.  WITH PERSISTANCE AND PERSEVERANCE, THE IMPOSSIBLE , BECOMES THE NORM!

## 2015-02-02 ENCOUNTER — Encounter: Payer: Self-pay | Admitting: Family Medicine

## 2015-02-02 LAB — VITAMIN D 25 HYDROXY (VIT D DEFICIENCY, FRACTURES): VIT D 25 HYDROXY: 6 ng/mL — AB (ref 30–100)

## 2015-02-02 LAB — HEMOGLOBIN A1C
Hgb A1c MFr Bld: 6.4 % — ABNORMAL HIGH (ref <5.7)
Mean Plasma Glucose: 137 mg/dL — ABNORMAL HIGH (ref <117)

## 2015-02-02 LAB — TSH: TSH: 3.042 u[IU]/mL (ref 0.350–4.500)

## 2015-02-02 NOTE — Assessment & Plan Note (Addendum)
Controlled, no change in management Patient educated about the importance of limiting  Carbohydrate intake , the need to commit to daily physical activity for a minimum of 30 minutes , and to commit weight loss. The fact that changes in all these areas will improve dibetic control  Diabetic Labs Latest Ref Rng 02/01/2015 08/28/2014 07/20/2013 06/14/2013 04/14/2013  HbA1c <5.7 % 6.4(H) - 6.5(H) - 6.6(H)  Microalbumin <2.0 mg/dL - <0.2 - - -  Micro/Creat Ratio 0.0 - 30.0 mg/g - SEE NOTE - - -  Chol 0 - 200 mg/dL 164 - 108 - 174  HDL >=46 mg/dL 31(L) - 23(L) - 27(L)  Calc LDL 0 - 99 mg/dL 113(H) - 69 - 120(H)  Triglycerides <150 mg/dL 98 - 80 - 134  Creatinine 0.50 - 1.10 mg/dL 0.59 - 0.82 0.7 0.75  GFR >60.00 mL/min - - - 114.36 -   BP/Weight 02/01/2015 10/07/2014 08/31/2014 08/28/2014 04/27/2014 03/14/2014 0/16/0109  Systolic BP 323 98 557 322 94 025 427  Diastolic BP 80 68 78 70 74 72 78  Wt. (Lbs) 210.8 212.5 218.5 219.08 214.5 220 212  BMI 33.01 33.27 34.21 34.3 33.59 35.53 33.2   Foot/eye exam completion dates 08/28/2014 03/15/2013  Foot Form Completion Done Done

## 2015-02-05 MED ORDER — VITAMIN D (ERGOCALCIFEROL) 1.25 MG (50000 UNIT) PO CAPS: 50000.0000 [IU] | ORAL_CAPSULE | ORAL | Status: DC

## 2015-02-07 ENCOUNTER — Other Ambulatory Visit: Payer: Self-pay | Admitting: Family Medicine

## 2015-02-07 MED ORDER — ALPRAZOLAM 1 MG PO TABS
1.0000 mg | ORAL_TABLET | Freq: Every day | ORAL | Status: DC
Start: 2015-02-20 — End: 2015-05-09

## 2015-02-07 MED ORDER — ATORVASTATIN CALCIUM 20 MG PO TABS: 20.0000 mg | ORAL_TABLET | Freq: Every day | ORAL | Status: DC

## 2015-03-01 ENCOUNTER — Other Ambulatory Visit: Payer: Self-pay

## 2015-03-01 MED ORDER — ATORVASTATIN CALCIUM 20 MG PO TABS: 20.0000 mg | ORAL_TABLET | Freq: Every day | ORAL | Status: DC

## 2015-03-11 DIAGNOSIS — F418 Other specified anxiety disorders: Secondary | ICD-10-CM | POA: Insufficient documentation

## 2015-03-11 DIAGNOSIS — F4329 Adjustment disorder with other symptoms: Secondary | ICD-10-CM | POA: Insufficient documentation

## 2015-03-11 DIAGNOSIS — F4381 Prolonged grief disorder: Secondary | ICD-10-CM | POA: Insufficient documentation

## 2015-03-11 NOTE — Assessment & Plan Note (Signed)
Rate controlled and asymptomatic 

## 2015-03-11 NOTE — Assessment & Plan Note (Signed)
Uncontrolled, medication adjusted, with treatment of depression and addition of restoril. Pt to call back if no improvement in sleep over  The next 2 weeks

## 2015-03-11 NOTE — Assessment & Plan Note (Signed)
Denies any recent episodes of defibrillator malfunction or any recent need for defibrillation, followed regularly by cardiology

## 2015-03-11 NOTE — Assessment & Plan Note (Signed)
Controlled, no change in medication DASH diet and commitment to daily physical activity for a minimum of 30 minutes discussed and encouraged, as a part of hypertension management. The importance of attaining a healthy weight is also discussed.  BP/Weight 02/01/2015 10/07/2014 08/31/2014 08/28/2014 04/27/2014 03/14/2014 2/99/2426  Systolic BP 834 98 196 222 94 979 892  Diastolic BP 80 68 78 70 74 72 78  Wt. (Lbs) 210.8 212.5 218.5 219.08 214.5 220 212  BMI 33.01 33.27 34.21 34.3 33.59 35.53 33.2

## 2015-03-11 NOTE — Assessment & Plan Note (Signed)
Loss of her mother approx 1 year ago, and still having significant and disabling symptoms of depression, needs therapy, states "not ready"

## 2015-03-11 NOTE — Progress Notes (Signed)
[name]     MRN: [id number]      DOB: [birth year]   HPI [name] is here for follow up and re-evaluation of chronic medical conditions, medication management and review of any available recent lab and radiology data.  Preventive health is updated, specifically  Cancer screening and Immunization.   Questions or concerns regarding consultations or procedures which the PT has had in the interim are  addressed. C/o uncontrolled insomnia, sleeping on avg 3 hrs per night, has unresolved issues with her Mother's death 1 year ago, states her Mom was her "best friend" and she misses her a lot.Not interested in therapy currently, does not feel she could handle it and would not benefit her ROS Denies recent fever or chills. Denies sinus pressure, nasal congestion, ear pain or sore throat. Denies chest congestion, productive cough or wheezing. Denies chest pains, palpitations and leg swelling Denies abdominal pain, nausea, vomiting,diarrhea or constipation.   Denies dysuria, frequency, hesitancy or incontinence. Chronic  joint pain,  And mild  limitation in mobility. Denies headaches, seizures, numbness, or tingling.  Denies skin break down or rash.   PE  BP 118/80 mmHg  Pulse 87  Resp 16  Ht 5\' 7"  (1.702 m)  Wt 210 lb 12.8 oz (95.618 kg)  BMI 33.01 kg/m2  SpO2 98%  Patient alert and oriented and in no cardiopulmonary distress.  HEENT: No facial asymmetry, EOMI,   oropharynx pink and moist.  Neck supple no JVD, no mass.  Chest: Clear to auscultation bilaterally.decreased air entry  CVS: S1, S2 no murmurs, no S3.Regular rate.  ABD: Soft non tender.   Ext: No edema  MS: Adequate ROM spine, shoulders, hips and reduced in  knees.  Skin: Intact, no ulcerations or rash noted.  Psych: Good eye contact, normal affect. Memory intact both anxious and  depressed appearing.Tearful at times as she speaks of her deceased Mom  CNS: CN 2-12 intact, power,  normal throughout.no  focal deficits noted.   Assessment & Plan  Diabetes mellitus type 2 with complications Controlled, no change in management Patient educated about the importance of limiting  Carbohydrate intake , the need to commit to daily physical activity for a minimum of 30 minutes , and to commit weight loss. The fact that changes in all these areas will improve dibetic control  Diabetic Labs Latest Ref Rng 02/01/2015 08/28/2014 07/20/2013 06/14/2013 04/14/2013  HbA1c <5.7 % 6.4(H) - 6.5(H) - 6.6(H)  Microalbumin <2.0 mg/dL - <0.2 - - -  Micro/Creat Ratio 0.0 - 30.0 mg/g - SEE NOTE - - -  Chol 0 - 200 mg/dL 164 - 108 - 174  HDL >=46 mg/dL 31(L) - 23(L) - 27(L)  Calc LDL 0 - 99 mg/dL 113(H) - 69 - 120(H)  Triglycerides <150 mg/dL 98 - 80 - 134  Creatinine 0.50 - 1.10 mg/dL 0.59 - 0.82 0.7 0.75  GFR >60.00 mL/min - - - 114.36 -   BP/Weight 02/01/2015 10/07/2014 08/31/2014 08/28/2014 04/27/2014 03/14/2014 03/26/9832  Systolic BP 825 98 053 976 94 734 193  Diastolic BP 80 68 78 70 74 72 78  Wt. (Lbs) 210.8 212.5 218.5 219.08 214.5 220 212  BMI 33.01 33.27 34.21 34.3 33.59 35.53 33.2   Foot/eye exam completion dates 08/28/2014 03/15/2013  Foot Form Completion Done Done        Insomnia secondary to depression with anxiety Uncontrolled, medication adjusted, with treatment of depression and addition of restoril. Pt to call back if no improvement in sleep  over  The next 2 weeks  Depression with anxiety fluoxetine started , encouraged therapy but currently refuses, not actively suicidal or homicidal  Prolonged grief reaction Loss of her mother approx 1 year ago, and still having significant and disabling symptoms of depression, needs therapy, states "not ready"  Nicotine dependence Patient counseled for approximately 5 minutes regarding the health risks of ongoing nicotine use, specifically all types of cancer, heart disease, stroke and respiratory failure. The options available for help with cessation  ,the behavioral changes to assist the process, and the option to either gradully reduce usage  Or abruptly stop.is also discussed. Pt is also encouraged to set specific goals in number of cigarettes used daily, as well as to set a quit date.  Number of cigarettes/cigars currently smoking daily: 10   Automatic implantable cardiac defibrillator--[employer] Denies any recent episodes of defibrillator malfunction or any recent need for defibrillation, followed regularly by cardiology  Morbid obesity Improved. Patient re-educated about  the importance of commitment to a  minimum of 150 minutes of exercise per week.  The importance of healthy food choices with portion control discussed. Encouraged to start a food diary, count calories and to consider  joining a support group. Sample diet sheets offered. Goals set by the patient for the next several months.   Weight /BMI 02/01/2015 10/07/2014 08/31/2014  WEIGHT 210 lb 12.8 oz 212 lb 8 oz 218 lb 8 oz  HEIGHT 5\' 7"  5\' 7"  5\' 7"   BMI 33.01 kg/m2 33.27 kg/m2 34.21 kg/m2    Current exercise per week 60 minutes.   HTN, goal below 130/80 Controlled, no change in medication DASH diet and commitment to daily physical activity for a minimum of 30 minutes discussed and encouraged, as a part of hypertension management. The importance of attaining a healthy weight is also discussed.  BP/Weight 02/01/2015 10/07/2014 08/31/2014 08/28/2014 04/27/2014 03/14/2014 12/01/8307  Systolic BP 407 98 680 881 94 103 159  Diastolic BP 80 68 78 70 74 72 78  Wt. (Lbs) 210.8 212.5 218.5 219.08 214.5 220 212  BMI 33.01 33.27 34.21 34.3 33.59 35.53 33.2        Ventricular tachycardia (paroxysmal) Rate controlled and asymptomatic

## 2015-03-11 NOTE — Assessment & Plan Note (Signed)
fluoxetine started , encouraged therapy but currently refuses, not actively suicidal or homicidal

## 2015-03-11 NOTE — Assessment & Plan Note (Signed)

## 2015-03-11 NOTE — Assessment & Plan Note (Addendum)
Improved. Patient re-educated about  the importance of commitment to a  minimum of 150 minutes of exercise per week.  The importance of healthy food choices with portion control discussed. Encouraged to start a food diary, count calories and to consider  joining a support group. Sample diet sheets offered. Goals set by the patient for the next several months.   Weight /BMI 02/01/2015 10/07/2014 08/31/2014  WEIGHT 210 lb 12.8 oz 212 lb 8 oz 218 lb 8 oz  HEIGHT 5\' 7"  5\' 7"  5\' 7"   BMI 33.01 kg/m2 33.27 kg/m2 34.21 kg/m2    Current exercise per week 60 minutes.

## 2015-03-12 ENCOUNTER — Ambulatory Visit (INDEPENDENT_AMBULATORY_CARE_PROVIDER_SITE_OTHER): Payer: Medicare Other | Admitting: *Deleted

## 2015-03-12 DIAGNOSIS — I255 Ischemic cardiomyopathy: Secondary | ICD-10-CM

## 2015-03-12 DIAGNOSIS — I4901 Ventricular fibrillation: Secondary | ICD-10-CM | POA: Diagnosis not present

## 2015-03-12 DIAGNOSIS — Z95810 Presence of automatic (implantable) cardiac defibrillator: Secondary | ICD-10-CM

## 2015-03-20 LAB — CUP PACEART REMOTE DEVICE CHECK
Battery Remaining Longevity: 129 mo
Date Time Interrogation Session: 20160725052204
HighPow Impedance: 71 Ohm
Lead Channel Impedance Value: 513 Ohm
Lead Channel Sensing Intrinsic Amplitude: 6 mV
Lead Channel Sensing Intrinsic Amplitude: 6 mV
Lead Channel Setting Pacing Amplitude: 2.5 V
Lead Channel Setting Pacing Pulse Width: 0.4 ms
MDC IDC MSMT BATTERY VOLTAGE: 3 V
MDC IDC MSMT LEADCHNL RV IMPEDANCE VALUE: 456 Ohm
MDC IDC MSMT LEADCHNL RV PACING THRESHOLD AMPLITUDE: 0.625 V
MDC IDC MSMT LEADCHNL RV PACING THRESHOLD PULSEWIDTH: 0.4 ms
MDC IDC SET LEADCHNL RV SENSING SENSITIVITY: 0.3 mV
MDC IDC SET ZONE DETECTION INTERVAL: 250 ms
MDC IDC STAT BRADY RV PERCENT PACED: 0.01 %
Zone Setting Detection Interval: 200 ms
Zone Setting Detection Interval: 300 ms
Zone Setting Detection Interval: 460 ms

## 2015-03-20 NOTE — Progress Notes (Signed)
ICD remote received. Sensing, impedances, auto capture thresholds consistent with previous device measurements. Histograms appropriate for patient and level of activity.  No ventricular arrhythmias recorded. All other diagnostic data reviewed and is appropriate and stable for patient. Real time EGM demonstrates appropriate sensing and capture. Estimated longevity 10.7 years.  Plan to check remotely in 3 months, see in office 08/2015

## 2015-04-02 DIAGNOSIS — E785 Hyperlipidemia, unspecified: Secondary | ICD-10-CM | POA: Diagnosis not present

## 2015-04-02 DIAGNOSIS — E119 Type 2 diabetes mellitus without complications: Secondary | ICD-10-CM | POA: Diagnosis not present

## 2015-04-03 ENCOUNTER — Ambulatory Visit: Payer: Medicare Other | Admitting: Family Medicine

## 2015-04-03 ENCOUNTER — Encounter: Payer: Self-pay | Admitting: Cardiology

## 2015-04-03 LAB — COMPLETE METABOLIC PANEL WITH GFR
ALT: 15 U/L (ref 6–29)
AST: 17 U/L (ref 10–35)
Albumin: 3.7 g/dL (ref 3.6–5.1)
Alkaline Phosphatase: 101 U/L (ref 33–130)
BUN: 7 mg/dL (ref 7–25)
CALCIUM: 9 mg/dL (ref 8.6–10.4)
CO2: 24 mmol/L (ref 20–31)
CREATININE: 0.66 mg/dL (ref 0.50–1.05)
Chloride: 105 mmol/L (ref 98–110)
GFR, Est African American: >89 mL/min (ref >=60)
GFR, Est Non African American: >89 mL/min (ref >=60)
Glucose, Bld: 103 mg/dL — ABNORMAL HIGH (ref 65–99)
Potassium: 3.8 mmol/L (ref 3.5–5.3)
Sodium: 138 mmol/L (ref 135–146)
Total Bilirubin: 0.9 mg/dL (ref 0.2–1.2)
Total Protein: 7.5 g/dL (ref 6.1–8.1)

## 2015-04-03 LAB — LIPID PANEL
Cholesterol: 154 mg/dL (ref 125–200)
HDL: 37 mg/dL — ABNORMAL LOW (ref >=46)
LDL CALC: 97 mg/dL (ref <130)
TRIGLYCERIDES: 101 mg/dL (ref <150)
Total CHOL/HDL Ratio: 4.2 Ratio (ref <=5.0)
VLDL: 20 mg/dL (ref <30)

## 2015-04-03 LAB — HEMOGLOBIN A1C
Hgb A1c MFr Bld: 6.2 % — ABNORMAL HIGH (ref <5.7)
Mean Plasma Glucose: 131 mg/dL — ABNORMAL HIGH (ref <117)

## 2015-04-12 ENCOUNTER — Emergency Department (HOSPITAL_COMMUNITY)
Admission: EM | Admit: 2015-04-12 | Discharge: 2015-04-13 | Disposition: A | Discharge status: Home / Self Care | Payer: Medicare Other | Attending: Emergency Medicine | Admitting: Emergency Medicine

## 2015-04-12 ENCOUNTER — Encounter (HOSPITAL_COMMUNITY): Payer: Self-pay | Admitting: Emergency Medicine

## 2015-04-12 ENCOUNTER — Encounter: Payer: Self-pay | Admitting: Internal Medicine

## 2015-04-12 DIAGNOSIS — E785 Hyperlipidemia, unspecified: Secondary | ICD-10-CM | POA: Insufficient documentation

## 2015-04-12 DIAGNOSIS — Z79899 Other long term (current) drug therapy: Secondary | ICD-10-CM | POA: Diagnosis not present

## 2015-04-12 DIAGNOSIS — M545 Low back pain, unspecified: Secondary | ICD-10-CM

## 2015-04-12 DIAGNOSIS — Z8719 Personal history of other diseases of the digestive system: Secondary | ICD-10-CM | POA: Insufficient documentation

## 2015-04-12 DIAGNOSIS — Z720 Tobacco use: Secondary | ICD-10-CM | POA: Insufficient documentation

## 2015-04-12 DIAGNOSIS — E119 Type 2 diabetes mellitus without complications: Secondary | ICD-10-CM | POA: Diagnosis not present

## 2015-04-12 DIAGNOSIS — Z95810 Presence of automatic (implantable) cardiac defibrillator: Secondary | ICD-10-CM | POA: Diagnosis not present

## 2015-04-12 DIAGNOSIS — R202 Paresthesia of skin: Secondary | ICD-10-CM | POA: Insufficient documentation

## 2015-04-12 DIAGNOSIS — M329 Systemic lupus erythematosus, unspecified: Secondary | ICD-10-CM | POA: Insufficient documentation

## 2015-04-12 DIAGNOSIS — I2510 Atherosclerotic heart disease of native coronary artery without angina pectoris: Secondary | ICD-10-CM | POA: Diagnosis not present

## 2015-04-12 DIAGNOSIS — R200 Anesthesia of skin: Secondary | ICD-10-CM | POA: Diagnosis not present

## 2015-04-12 DIAGNOSIS — G4700 Insomnia, unspecified: Secondary | ICD-10-CM | POA: Insufficient documentation

## 2015-04-12 DIAGNOSIS — I4901 Ventricular fibrillation: Secondary | ICD-10-CM | POA: Diagnosis not present

## 2015-04-12 DIAGNOSIS — I252 Old myocardial infarction: Secondary | ICD-10-CM | POA: Diagnosis not present

## 2015-04-12 DIAGNOSIS — Z7982 Long term (current) use of aspirin: Secondary | ICD-10-CM | POA: Insufficient documentation

## 2015-04-12 DIAGNOSIS — I5020 Unspecified systolic (congestive) heart failure: Secondary | ICD-10-CM | POA: Diagnosis not present

## 2015-04-12 DIAGNOSIS — M179 Osteoarthritis of knee, unspecified: Secondary | ICD-10-CM | POA: Insufficient documentation

## 2015-04-12 DIAGNOSIS — Z7902 Long term (current) use of antithrombotics/antiplatelets: Secondary | ICD-10-CM | POA: Diagnosis not present

## 2015-04-12 DIAGNOSIS — F329 Major depressive disorder, single episode, unspecified: Secondary | ICD-10-CM | POA: Diagnosis not present

## 2015-04-12 NOTE — ED Notes (Signed)
Patient complaining of back pain that has bothered her for 7 days, worsening tonight. States pain worsens with deep breathing.

## 2015-04-13 MED ORDER — CYCLOBENZAPRINE HCL 10 MG PO TABS: 10.0000 mg | ORAL_TABLET | Freq: Three times a day (TID) | ORAL | Status: DC | PRN

## 2015-04-13 MED ORDER — KETOROLAC TROMETHAMINE 60 MG/2ML IM SOLN
60.0000 mg | Freq: Once | INTRAMUSCULAR | Status: AC
  Administered 2015-04-13: 60 mg via INTRAMUSCULAR
  Filled 2015-04-13: qty 2

## 2015-04-13 MED ORDER — FENTANYL CITRATE (PF) 100 MCG/2ML IJ SOLN
50.0000 ug | Freq: Once | INTRAMUSCULAR | Status: AC
  Administered 2015-04-13: 50 ug via INTRAMUSCULAR
  Filled 2015-04-13: qty 2

## 2015-04-13 MED ORDER — NAPROXEN 500 MG PO TABS: ORAL_TABLET | ORAL | Status: DC

## 2015-04-13 MED ORDER — DIAZEPAM 5 MG/ML IJ SOLN
10.0000 mg | Freq: Once | INTRAMUSCULAR | Status: AC
  Administered 2015-04-13: 10 mg via INTRAMUSCULAR
  Filled 2015-04-13: qty 2

## 2015-04-13 NOTE — Discharge Instructions (Signed)
Use ice and heat on your back. Take the medications as prescribed. You should still have plenty of pain medications to take at home. Recheck by your doctor if not improving in the next week.    Back Pain, Adult Back pain is very common. The pain often gets better over time. The cause of back pain is usually not dangerous. Most people can learn to manage their back pain on their own.  HOME CARE   Stay active. Start with short walks on flat ground if you can. Try to walk farther each day.  Do not sit, drive, or stand in one place for more than 30 minutes. Do not stay in bed.  Do not avoid exercise or work. Activity can help your back heal faster.  Be careful when you bend or lift an object. Bend at your knees, keep the object close to you, and do not twist.  Sleep on a firm mattress. Lie on your side, and bend your knees. If you lie on your back, put a pillow under your knees.  Only take medicines as told by your doctor.  Put ice on the injured area.  Put ice in a plastic bag.  Place a towel between your skin and the bag.  Leave the ice on for 15-20 minutes, 03-04 times a day for the first 2 to 3 days. After that, you can switch between ice and heat packs.  Ask your doctor about back exercises or massage.  Avoid feeling anxious or stressed. Find good ways to deal with stress, such as exercise. GET HELP RIGHT AWAY IF:   Your pain does not go away with rest or medicine.  Your pain does not go away in 1 week.  You have new problems.  You do not feel well.  The pain spreads into your legs.  You cannot control when you poop (bowel movement) or pee (urinate).  Your arms or legs feel weak or lose feeling (numbness).  You feel sick to your stomach (nauseous) or throw up (vomit).  You have belly (abdominal) pain.  You feel like you may pass out (faint). MAKE SURE YOU:   Understand these instructions.  Will watch your condition.  Will get help right away if you are not  doing well or get worse. Document Released: 01/21/2008 Document Revised: 10/27/2011 Document Reviewed: 12/06/2013 [facility] Patient Information 2015 [place], [place]. This information is not intended to replace advice given to you by your health care provider. Make sure you discuss any questions you have with your health care provider.    Back Injury Prevention Back injuries can be extremely painful and difficult to heal. After having one back injury, you are much more likely to experience another later on. It is important to learn how to avoid injuring or re-injuring your back. The following tips can help you to prevent a back injury. PHYSICAL FITNESS  Exercise regularly and try to develop good tone in your abdominal muscles. Your abdominal muscles provide a lot of the support needed by your back.  Do aerobic exercises (walking, jogging, biking, swimming) regularly.  Do exercises that increase balance and strength (tai chi, yoga) regularly. This can decrease your risk of falling and injuring your back.  Stretch before and after exercising.  Maintain a healthy weight. The more you weigh, the more stress is placed on your back. For every pound of weight, 10 times that amount of pressure is placed on the back. DIET  Talk to your caregiver about how much calcium  and vitamin D you need per day. These nutrients help to prevent weakening of the bones (osteoporosis). Osteoporosis can cause broken (fractured) bones that lead to back pain.  Include good sources of calcium in your diet, such as dairy products, green, leafy vegetables, and products with calcium added (fortified).  Include good sources of vitamin D in your diet, such as milk and foods that are fortified with vitamin D.  Consider taking a nutritional supplement or a multivitamin if needed.  Stop smoking if you smoke. POSTURE  Sit and stand up straight. Avoid leaning forward when you sit or hunching over when you stand.  Choose  chairs with good low back (lumbar) support.  If you work at a desk, sit close to your work so you do not need to lean over. Keep your chin tucked in. Keep your neck drawn back and elbows bent at a right angle. Your arms should look like the letter "L."  Sit high and close to the steering wheel when you drive. Add a lumbar support to your car seat if needed.  Avoid sitting or standing in one position for too long. Take breaks to get up, stretch, and walk around at least once every hour. Take breaks if you are driving for long periods of time.  Sleep on your side with your knees slightly bent, or sleep on your back with a pillow under your knees. Do not sleep on your stomach. LIFTING, TWISTING, AND REACHING  Avoid heavy lifting, especially repetitive lifting. If you must do heavy lifting:  Stretch before lifting.  Work slowly.  Rest between lifts.  Use carts and dollies to move objects when possible.  Make several small trips instead of carrying 1 heavy load.  Ask for help when you need it.  Ask for help when moving big, awkward objects.  Follow these steps when lifting:  Stand with your feet shoulder-width apart.  Get as close to the object as you can. Do not try to pick up heavy objects that are far from your body.  Use handles or lifting straps if they are available.  Bend at your knees. Squat down, but keep your heels off the floor.  Keep your shoulders pulled back, your chin tucked in, and your back straight.  Lift the object slowly, tightening the muscles in your legs, abdomen, and buttocks. Keep the object as close to the center of your body as possible.  When you put a load down, use these same guidelines in reverse.  Do not:  Lift the object above your waist.  Twist at the waist while lifting or carrying a load. Move your feet if you need to turn, not your waist.  Bend over without bending at your knees.  Avoid reaching over your head, across a table, or for  an object on a high surface. OTHER TIPS  Avoid wet floors and keep sidewalks clear of ice to prevent falls.  Do not sleep on a mattress that is too soft or too hard.  Keep items that are used frequently within easy reach.  Put heavier objects on shelves at waist level and lighter objects on lower or higher shelves.  Find ways to decrease your stress, such as exercise, massage, or relaxation techniques. Stress can build up in your muscles. Tense muscles are more vulnerable to injury.  Seek treatment for depression or anxiety if needed. These conditions can increase your risk of developing back pain. SEEK MEDICAL CARE IF:  You injure your back.  You  have questions about diet, exercise, or other ways to prevent back injuries. MAKE SURE YOU:  Understand these instructions.  Will watch your condition.  Will get help right away if you are not doing well or get worse. Document Released: 09/11/2004 Document Revised: 10/27/2011 Document Reviewed: 09/15/2011 [facility] Patient Information 2015 [place], [place]. This information is not intended to replace advice given to you by your health care provider. Make sure you discuss any questions you have with your health care provider.

## 2015-04-13 NOTE — ED Notes (Signed)
Pt left ED via wheelchair with no signs of distress. Pt verbalized discharge instructions.  

## 2015-04-13 NOTE — ED Provider Notes (Signed)
CSN: [id number]   Arrival date & time 04/12/15 2309  History  This chart was scribed for Rolland Porter, MD by Altamease Oiler, ED Scribe. This patient was seen in room APA18/APA18 and the patient's care was started at 12:16 AM.  Chief Complaint  Patient presents with  . Back Pain    HPI The history is provided by the patient. No language interpreter was used.   [name] is a 49 y.o. female who presents to the Emergency Department complaining of atraumatic bilateral lower back pain with onset 4 days ago.She rates the constant pain 8/10 in severity. Deep breathing, movement, and sitting upright exacerbate the pain. Laying on the side and "stooping" forward improve the pain. Associated symptoms include intermittent numbness and tingling in the left lateral thigh with episodes that last for seconds. Pt denies bowel or bladder incontinence. She denies any injury. She denies prior back problems.   Her daugogy: Dr. Annett Fabian is driving her home from the ED today.   PCP Dr Bari Mantis Rheumatology Dr Sheral Flow in [facility] Cardiology Dr Caryl Comes.  Past Medical History  Diagnosis Date  . coronary artery disease     RCA stenting Myoview 2011 EF 30% infarction dilatation without ischemia  . Systolic CHF   . Automatic implantable cardiac defibrillator in situ     a. s/p prior Medtronic ICD with 6949 lead. b. s/p Gen change & lead revision 05/2013.  Marland Kitchen Other and unspecified hyperlipidemia   . Cardiomyopathy secondary     Patient has cardiomyopathy out of proportion to her ischemic heart disease  . Depression   . GERD (gastroesophageal reflux disease)   . Nicotine abuse   . Insomnia   . Migraines   . Lupus (systemic lupus erythematosus)   . Arthritis of knee   . Myocardial infarction     8 total   . Diabetes mellitus   . VF (ventricular fibrillation)     a. Hx appropriate ICD therapy for VF.  Marland Kitchen Paroxysmal VT     Past Surgical History  Procedure Laterality Date  . Cholecystectomy    .  Abdominal hysterectomy    . Rotator cuff repair Right 2002  . Defibrillator placed   2009 and 05/2013  . Colonoscopy  07/15/2011    SLF: internal hemorrhoids/hyperplastic polyps in the rectum/tubular adenomaSURVEILLANCE Nov 2017  . Icd generator change  06/17/2013    Dr Caryl Comes  . Lead revision N/A 06/17/2013    Procedure: LEAD REVISION;  Surgeon: Deboraha Sprang, MD;  Location: [facility] CATH LAB;  Service: Cardiovascular;  Laterality: N/A;  . Implantable cardioverter defibrillator (icd) generator change Left 06/17/2013    Procedure: ICD GENERATOR CHANGE;  Surgeon: Deboraha Sprang, MD;  Location: [facility] CATH LAB;  Service: Cardiovascular;  Laterality: Left;    Family History  Problem Relation Age of Onset  . Hepatitis Mother 37    HCV  . Liver cancer Mother 26  . Cancer Mother   . Diabetes Mother   . Heart disease Mother   . Hyperlipidemia Mother   . Stroke Sister 45    x3  . Diabetes Sister   . Hyperlipidemia Sister   . Dementia Father   . HIV Sister   . Diabetes Brother   . Heart disease Brother   . Hyperlipidemia Brother     Social History  Substance Use Topics  . Smoking status: Current Every Day Smoker -- 0.75 packs/day for 7 years    Types: Cigarettes  . Smokeless tobacco: Never  Used  . Alcohol Use: 0.0 oz/week    0 Standard drinks or equivalent per week     Comment: occasionaly   on disability for lupus and her heart Smokes 1/2 ppd  Review of Systems  Musculoskeletal: Positive for back pain.  All other systems reviewed and are negative.   Home Medications   Prior to Admission medications   Medication Sig Start Date End Date Taking? Authorizing Provider  albuterol (PROVENTIL HFA;VENTOLIN HFA) 108 (90 BASE) MCG/ACT inhaler Inhale 2 puffs into the lungs every 2 (two) hours as needed for wheezing or shortness of breath (cough). 16/1/09   Delora Fuel, MD  ALPRAZolam Duanne Moron) 1 MG tablet Take 1 tablet (1 mg total) by mouth at bedtime. 02/20/15   Fayrene Helper, MD  aspirin 81  MG tablet Take 1 tablet (81 mg total) by mouth daily. 09/26/13   Deboraha Sprang, MD  Aspirin-Acetaminophen (GOODYS BODY PAIN PO) Take 1 Package by mouth daily as needed. For pain    Historical Provider, MD  atorvastatin (LIPITOR) 20 MG tablet Take 1 tablet (20 mg total) by mouth daily. 03/01/15   Fayrene Helper, MD  benzonatate (TESSALON) 100 MG capsule Take 1-2 capsules (100-200 mg total) by mouth 3 (three) times daily as needed for cough. 10/07/14   Darlyne Russian, MD  carvedilol (COREG) 3.125 MG tablet Take 1 tablet (3.125 mg total) by mouth 2 (two) times daily. 08/31/14   Deboraha Sprang, MD  clopidogrel (PLAVIX) 75 MG tablet Take 1 tablet (75 mg total) by mouth daily. 08/31/14   Deboraha Sprang, MD  cyclobenzaprine (FLEXERIL) 10 MG tablet Take 1 tablet (10 mg total) by mouth 3 (three) times daily as needed for muscle spasms. 04/13/15   Rolland Porter, MD  fenofibrate (TRICOR) 145 MG tablet Take by mouth. 04/15/13   Historical Provider, MD  fluconazole (DIFLUCAN) 150 MG tablet Take 1 tablet (150 mg total) by mouth once. Repeat if needed 10/07/14   Darlyne Russian, MD  Fluoxetine HCl, PMDD, 20 MG CAPS Take 1 capsule (20 mg total) by mouth daily. 02/01/15   Fayrene Helper, MD  naproxen (NAPROSYN) 500 MG tablet Take 1 po BID with food prn pain 04/13/15   Rolland Porter, MD  oxyCODONE (ROXICODONE) 15 MG immediate release tablet Take 30 mg by mouth every 8 (eight) hours.    Historical Provider, MD  temazepam (RESTORIL) 30 MG capsule Take 1 capsule (30 mg total) by mouth at bedtime as needed for sleep. 02/01/15   Fayrene Helper, MD  Vitamin D, Ergocalciferol, (DRISDOL) 50000 UNITS CAPS capsule Take 1 capsule (50,000 Units total) by mouth every 7 (seven) days. 02/05/15   Fayrene Helper, MD    Allergies  Bee venom and Cortisone  Triage Vitals: BP 114/78 mmHg  Pulse 89  Temp(Src) 98.5 F (36.9 C) (Oral)  Resp 20  Ht 5\' 6"  (1.676 m)  Wt 210 lb (95.255 kg)  BMI 33.91 kg/m2  SpO2 98%  Vital signs normal     Physical Exam  Constitutional: She is oriented to person, place, and time. She appears well-developed and well-nourished.  Non-toxic appearance. She does not appear ill. No distress.  Laying on her right side in position of comfort with knees flexed  HENT:  Head: Normocephalic and atraumatic.  Right Ear: External ear normal.  Left Ear: External ear normal.  Nose: Nose normal. No mucosal edema or rhinorrhea.  Mouth/Throat: Oropharynx is clear and moist and mucous membranes are normal. No  dental abscesses or uvula swelling.  Eyes: Conjunctivae and EOM are normal. Pupils are equal, round, and reactive to light.  Neck: Normal range of motion and full passive range of motion without pain. Neck supple.  Cardiovascular: Normal rate, regular rhythm and normal heart sounds.  Exam reveals no gallop and no friction rub.   No murmur heard. Pulmonary/Chest: Effort normal and breath sounds normal. No respiratory distress. She has no wheezes. She has no rhonchi. She has no rales. She exhibits no tenderness and no crepitus.  Abdominal: Soft. Normal appearance and bowel sounds are normal. She exhibits no distension. There is no tenderness. There is no rebound and no guarding.  Musculoskeletal: Normal range of motion. She exhibits no edema or tenderness.       Back:  Tenderness at the entire lumbar spine and paraspinous muscles, non-tender thoracic and cervical spine.   Neurological: She is alert and oriented to person, place, and time. She has normal strength. No cranial nerve deficit.  Patellar reflexes 2+ and equal +SLR bilaterally  Skin: Skin is warm, dry and intact. No rash noted. No erythema. No pallor.  Psychiatric: She has a normal mood and affect. Her speech is normal and behavior is normal. Her mood appears not anxious.  Nursing note and vitals reviewed.   ED Course  Procedures Medications  ketorolac (TORADOL) injection 60 mg (60 mg Intramuscular Given 04/13/15 0053)  diazepam (VALIUM)  injection 10 mg (10 mg Intramuscular Given 04/13/15 0052)  fentaNYL (SUBLIMAZE) injection 50 mcg (50 mcg Intramuscular Given 04/13/15 0053)     DIAGNOSTIC STUDIES: Oxygen Saturation is 98% on RA, normal by my interpretation.    COORDINATION OF CARE: 12:32 AM Discussed treatment plan which includes pain management with pt at bedside and pt agreed to plan. Patient's daughter is with her in the ED and can drive her home. We discussed using both ice and heat to her back for discomfort.  Review of the [place] shows patient gets #180 oxycodone 15 mg immediate release tablets monthly, they were last filled on July 30.  Labs Reviewed - No data to display  Imaging Review No results found.      MDM   Final diagnoses:  Acute low back pain    New Prescriptions   CYCLOBENZAPRINE (FLEXERIL) 10 MG TABLET    Take 1 tablet (10 mg total) by mouth 3 (three) times daily as needed for muscle spasms.   NAPROXEN (NAPROSYN) 500 MG TABLET    Take 1 po BID with food prn pain    Plan discharge  Rolland Porter, MD, FACEP    I personally performed the services described in this documentation, which was scribed in my presence. The recorded information has been reviewed and considered.  Rolland Porter, MD, Barbette Or, MD 04/13/15 [phone]

## 2015-04-18 ENCOUNTER — Encounter: Payer: Self-pay | Admitting: Cardiology

## 2015-05-09 ENCOUNTER — Telehealth: Payer: Self-pay | Admitting: Family Medicine

## 2015-05-09 ENCOUNTER — Ambulatory Visit: Payer: Medicare Other | Admitting: Family Medicine

## 2015-05-09 ENCOUNTER — Encounter: Payer: Self-pay | Admitting: Family Medicine

## 2015-05-09 MED ORDER — ALPRAZOLAM 1 MG PO TABS: 1.0000 mg | ORAL_TABLET | Freq: Every day | ORAL | Status: DC

## 2015-05-09 NOTE — Telephone Encounter (Signed)
Patient missed her appointment today, she R/S to Dec 8th she states its due to so much on her mind and her father passed away this week, she is asking for a refill on herALPRAZolam Duanne Moron) 1 MG tablet please advise?

## 2015-05-09 NOTE — Telephone Encounter (Signed)
Med will be refilled to [facility]

## 2015-05-23 DIAGNOSIS — E119 Type 2 diabetes mellitus without complications: Secondary | ICD-10-CM | POA: Diagnosis not present

## 2015-05-23 DIAGNOSIS — M329 Systemic lupus erythematosus, unspecified: Secondary | ICD-10-CM | POA: Diagnosis not present

## 2015-06-19 ENCOUNTER — Ambulatory Visit (INDEPENDENT_AMBULATORY_CARE_PROVIDER_SITE_OTHER): Payer: Medicare Other | Admitting: *Deleted

## 2015-06-19 ENCOUNTER — Encounter: Payer: Self-pay | Admitting: Internal Medicine

## 2015-06-19 DIAGNOSIS — I255 Ischemic cardiomyopathy: Secondary | ICD-10-CM | POA: Diagnosis not present

## 2015-06-20 NOTE — Progress Notes (Signed)
Remote ICD transmission.   

## 2015-06-25 ENCOUNTER — Telehealth: Payer: Self-pay | Admitting: *Deleted

## 2015-06-25 LAB — CUP PACEART REMOTE DEVICE CHECK
Battery Remaining Longevity: 127 mo
Battery Voltage: 3.01 V
Brady Statistic RV Percent Paced: 0.01 %
HIGH POWER IMPEDANCE MEASURED VALUE: 79 Ohm
Implantable Lead Implant Date: 20141031
Lead Channel Impedance Value: 532 Ohm
Lead Channel Pacing Threshold Pulse Width: 0.4 ms
Lead Channel Sensing Intrinsic Amplitude: 8.5 mV
Lead Channel Sensing Intrinsic Amplitude: 8.5 mV
Lead Channel Setting Pacing Amplitude: 2.5 V
MDC IDC LEAD LOCATION: 753860
MDC IDC LEAD MODEL: 6935
MDC IDC MSMT LEADCHNL RV IMPEDANCE VALUE: 456 Ohm
MDC IDC MSMT LEADCHNL RV PACING THRESHOLD AMPLITUDE: 0.625 V
MDC IDC SESS DTM: 20161101073525
MDC IDC SET LEADCHNL RV PACING PULSEWIDTH: 0.4 ms
MDC IDC SET LEADCHNL RV SENSING SENSITIVITY: 0.3 mV

## 2015-06-25 NOTE — Telephone Encounter (Signed)
Remote shows pt received ATP x3 on 05/26/15 at 23:06-23:07. Pt states she was watching TV. Pt states she had SOB & heart racing symptoms. She states her SOB & heart racing symptoms are occurring more. She does take her Coreg as directed. States she takes all her meds as directed.   Episodes reviewed w/ Dr. Caryl Comes. Difficult to discern if SVT or VT. ATP did return pt to normal rhythm.   ROV w/ SK 09/03/15.

## 2015-06-26 ENCOUNTER — Encounter: Payer: Self-pay | Admitting: Cardiology

## 2015-06-29 DIAGNOSIS — Z79899 Other long term (current) drug therapy: Secondary | ICD-10-CM | POA: Diagnosis not present

## 2015-06-29 DIAGNOSIS — H40003 Preglaucoma, unspecified, bilateral: Secondary | ICD-10-CM | POA: Diagnosis not present

## 2015-06-29 DIAGNOSIS — Z83511 Family history of glaucoma: Secondary | ICD-10-CM | POA: Diagnosis not present

## 2015-06-29 DIAGNOSIS — H2513 Age-related nuclear cataract, bilateral: Secondary | ICD-10-CM | POA: Diagnosis not present

## 2015-06-29 DIAGNOSIS — H40033 Anatomical narrow angle, bilateral: Secondary | ICD-10-CM | POA: Diagnosis not present

## 2015-06-29 DIAGNOSIS — M329 Systemic lupus erythematosus, unspecified: Secondary | ICD-10-CM | POA: Diagnosis not present

## 2015-06-29 DIAGNOSIS — F17210 Nicotine dependence, cigarettes, uncomplicated: Secondary | ICD-10-CM | POA: Diagnosis not present

## 2015-06-29 LAB — HM DIABETES EYE EXAM

## 2015-07-10 ENCOUNTER — Encounter: Payer: Self-pay | Admitting: Cardiology

## 2015-07-26 ENCOUNTER — Encounter: Payer: Self-pay | Admitting: Family Medicine

## 2015-07-26 ENCOUNTER — Ambulatory Visit (INDEPENDENT_AMBULATORY_CARE_PROVIDER_SITE_OTHER): Payer: Medicare Other | Admitting: Family Medicine

## 2015-07-26 VITALS — BP 108/64 | HR 68 | Resp 18 | Ht 66.5 in | Wt 213.1 lb

## 2015-07-26 DIAGNOSIS — R319 Hematuria, unspecified: Secondary | ICD-10-CM

## 2015-07-26 DIAGNOSIS — Z23 Encounter for immunization: Secondary | ICD-10-CM

## 2015-07-26 DIAGNOSIS — Z1159 Encounter for screening for other viral diseases: Secondary | ICD-10-CM

## 2015-07-26 DIAGNOSIS — E559 Vitamin D deficiency, unspecified: Secondary | ICD-10-CM | POA: Diagnosis not present

## 2015-07-26 DIAGNOSIS — I472 Ventricular tachycardia: Secondary | ICD-10-CM

## 2015-07-26 DIAGNOSIS — E785 Hyperlipidemia, unspecified: Secondary | ICD-10-CM

## 2015-07-26 DIAGNOSIS — I10 Essential (primary) hypertension: Secondary | ICD-10-CM

## 2015-07-26 DIAGNOSIS — N3001 Acute cystitis with hematuria: Secondary | ICD-10-CM | POA: Diagnosis not present

## 2015-07-26 DIAGNOSIS — F17218 Nicotine dependence, cigarettes, with other nicotine-induced disorders: Secondary | ICD-10-CM

## 2015-07-26 DIAGNOSIS — N029 Recurrent and persistent hematuria with unspecified morphologic changes: Secondary | ICD-10-CM

## 2015-07-26 DIAGNOSIS — E669 Obesity, unspecified: Secondary | ICD-10-CM

## 2015-07-26 DIAGNOSIS — I4729 Other ventricular tachycardia: Secondary | ICD-10-CM

## 2015-07-26 DIAGNOSIS — E118 Type 2 diabetes mellitus with unspecified complications: Secondary | ICD-10-CM

## 2015-07-26 DIAGNOSIS — Z1239 Encounter for other screening for malignant neoplasm of breast: Secondary | ICD-10-CM

## 2015-07-26 DIAGNOSIS — N3000 Acute cystitis without hematuria: Secondary | ICD-10-CM | POA: Diagnosis not present

## 2015-07-26 DIAGNOSIS — F418 Other specified anxiety disorders: Secondary | ICD-10-CM

## 2015-07-26 LAB — POCT URINALYSIS DIPSTICK
BILIRUBIN UA: NEGATIVE
Glucose, UA: NEGATIVE
KETONES UA: NEGATIVE
NITRITE UA: NEGATIVE
PH UA: 5.5
PROTEIN UA: NEGATIVE
Spec Grav, UA: >=1.03
Urobilinogen, UA: 0.2

## 2015-07-26 MED ORDER — ALPRAZOLAM 1 MG PO TABS: 1.0000 mg | ORAL_TABLET | Freq: Every day | ORAL | Status: DC

## 2015-07-26 MED ORDER — ERGOCALCIFEROL 1.25 MG (50000 UT) PO CAPS: 50000.0000 [IU] | ORAL_CAPSULE | ORAL | Status: DC

## 2015-07-26 NOTE — Assessment & Plan Note (Signed)

## 2015-07-26 NOTE — Patient Instructions (Signed)
Annual wellness end January/ early Feb, call if you need me before  Flu vaccine today  Urine sent to test for infection today, if no infection you will need to see urologist and will be referred  Commit to weekly vit D for 12 months at least, med sent in  Start 8 cigs daily in Jan and try to commit to cutting by 2 ciggs each month you NEED to quit  Help at health Dept, and 1800 quit NOW all the time    Fasting labs for Sep 08, 2015 or after

## 2015-07-26 NOTE — Progress Notes (Signed)
Subjective:    Patient ID: [name], female    DOB: 02-06-1966, 49 y.o.   MRN: [id number]  HPI    [name]     MRN: [id number]      DOB: February 02, 1966   HPI [name] is here for follow up and re-evaluation of chronic medical conditions, medication management and review of any available recent lab and radiology data.  Preventive health is updated, specifically  Cancer screening and Immunization.   Questions or concerns regarding consultations or procedures which the PT has had in the interim are  Addressed.Followign recent appt at [facility], she was notified  That she had a UTI and has been on medication for this, still experiencing both frequency and dysuria, fever, flank pain or chills The PT denies any adverse reactions to current medications since the last visit.  There are no new concerns.  There are no specific complaints   ROS Denies recent fever or chills. Denies sinus pressure, nasal congestion, ear pain or sore throat. Denies chest congestion, productive cough or wheezing. Denies chest pains, palpitations and leg swelling Denies abdominal pain, nausea, vomiting,diarrhea or constipation.    Denies uncontrolled  joint pain, swelling and limitation in mobility. Denies headaches, seizures, numbness, or tingling. Denies depression, anxiety or insomnia. Denies skin break down or rash.   PE  BP 108/64 mmHg  Pulse 68  Resp 18  Ht 5' 6.5" (1.689 m)  Wt 213 lb 1.9 oz (96.671 kg)  BMI 33.89 kg/m2  SpO2 96%  Patient alert and oriented and in no cardiopulmonary distress.  HEENT: No facial asymmetry, EOMI,   oropharynx pink and moist.  Neck supple no JVD, no mass.  Chest: Clear to auscultation bilaterally.  CVS: S1, S2 no murmurs, no S3.Regular rate.  ABD: Soft non tender.   Ext: No edema  MS: Adequate ROM spine, shoulders, hips and knees.  Skin: Intact, no ulcerations or rash noted.  Psych: Good eye contact, normal affect. Memory intact not  anxious or depressed appearing.  CNS: CN 2-12 intact, power,  normal throughout.no focal deficits noted.   Assessment & Plan   Nicotine dependence Patient counseled for approximately 5 minutes regarding the health risks of ongoing nicotine use, specifically all types of cancer, heart disease, stroke and respiratory failure. The options available for help with cessation ,the behavioral changes to assist the process, and the option to either gradully reduce usage  Or abruptly stop.is also discussed. Pt is also encouraged to set specific goals in number of cigarettes used daily, as well as to set a quit date.  Number of cigarettes/cigars currently smoking daily:10   Recurrent hematuria syndrome Sym[ptoms of infection, frequency ans pain , recently treated for UTI, rept urine specimen sent for c/s. If no infection , needs urology eval  HTN, goal below 130/80 Controlled, no change in medication DASH diet and commitment to daily physical activity for a minimum of 30 minutes discussed and encouraged, as a part of hypertension management. The importance of attaining a healthy weight is also discussed.  BP/Weight 07/26/2015 04/13/2015 04/12/2015 02/01/2015 10/07/2014 08/31/2014 [id number]  Systolic BP 123XX123 0000000 - 123456 98 123XX123 [id number]  Diastolic BP 64 83 - 80 68 78 70  Wt. (Lbs) 213.12 - 210 210.8 212.5 218.5 219.08  BMI 33.89 - 33.91 33.01 33.27 34.21 34.3        Depression with anxiety Improved, no change in management  Obesity (BMI 30.0-34.9) Deteriorated. Patient re-educated about  the importance of commitment to a  minimum of 150 minutes of exercise per week.  The importance of healthy food choices with portion control discussed. Encouraged to start a food diary, count calories and to consider  joining a support group. Sample diet sheets offered. Goals set by the patient for the next several months.   Weight /BMI 07/26/2015 04/12/2015 02/01/2015  WEIGHT 213 lb 1.9 oz 210 lb 210 lb 12.8 oz    HEIGHT 5' 6.5" 5\' 6"  5\' 7"   BMI 33.89 kg/m2 33.91 kg/m2 33.01 kg/m2    Current exercise per week 40 mins   Ventricular tachycardia (paroxysmal) Reports  recent episodes of tachycardia , however states she was unaware of the episodes. Has upcoming appt with ncardiology  Diabetes mellitus type 2 with complications Controlled, no change in medication [name] is reminded of the importance of commitment to daily physical activity for 30 minutes or more, as able and the need to limit carbohydrate intake to 30 to 60 grams per meal to help with blood sugar control.   The need to take medication as prescribed, test blood sugar as directed, and to call between visits if there is a concern that blood sugar is uncontrolled is also discussed.   [name] is reminded of the importance of daily foot exam, annual eye examination, and good blood sugar, blood pressure and cholesterol control. Updated lab needed at/ before next visit.   Diabetic Labs Latest Ref Rng 04/02/2015 02/01/2015 08/28/2014 07/20/2013 06/14/2013  HbA1c <5.7 % 6.2(H) 6.4(H) - 6.5(H) -  Microalbumin <2.0 mg/dL - - <0.2 - -  Micro/Creat Ratio 0.0 - 30.0 mg/g - - SEE NOTE - -  Chol 125 - 200 mg/dL 154 164 - 108 -  HDL >=46 mg/dL 37(L) 31(L) - 23(L) -  Calc LDL <130 mg/dL 97 113(H) - 69 -  Triglycerides <150 mg/dL 101 98 - 80 -  Creatinine 0.50 - 1.05 mg/dL 0.66 0.59 - 0.82 0.7  GFR >60.00 mL/min - - - - 114.36   BP/Weight 07/26/2015 04/13/2015 04/12/2015 02/01/2015 10/07/2014 08/31/2014 [id number]  Systolic BP 123XX123 0000000 - 123456 98 123XX123 [id number]  Diastolic BP 64 83 - 80 68 78 70  Wt. (Lbs) 213.12 - 210 210.8 212.5 218.5 219.08  BMI 33.89 - 33.91 33.01 33.27 34.21 34.3   Foot/eye exam completion dates Latest Ref Rng 06/29/2015 08/28/2014  Eye Exam No Retinopathy No Retinopathy -  Foot Form Completion - - Done         Acute cystitis with hematuria Symptomatic with abnormal uA, recently  Treated for UTI, if no established infection  , then urology to eval      Review of Systems     Objective:   Physical Exam        Assessment & Plan:

## 2015-07-29 DIAGNOSIS — N3001 Acute cystitis with hematuria: Secondary | ICD-10-CM | POA: Insufficient documentation

## 2015-07-29 LAB — URINE CULTURE

## 2015-07-29 NOTE — Assessment & Plan Note (Signed)
Deteriorated. Patient re-educated about  the importance of commitment to a  minimum of 150 minutes of exercise per week.  The importance of healthy food choices with portion control discussed. Encouraged to start a food diary, count calories and to consider  joining a support group. Sample diet sheets offered. Goals set by the patient for the next several months.   Weight /BMI 07/26/2015 04/12/2015 02/01/2015  WEIGHT 213 lb 1.9 oz 210 lb 210 lb 12.8 oz  HEIGHT 5' 6.5" 5\' 6"  5\' 7"   BMI 33.89 kg/m2 33.91 kg/m2 33.01 kg/m2    Current exercise per week 40 mins

## 2015-07-29 NOTE — Assessment & Plan Note (Signed)
Reports  recent episodes of tachycardia , however states she was unaware of the episodes. Has upcoming appt with ncardiology

## 2015-07-29 NOTE — Assessment & Plan Note (Signed)
Controlled, no change in medication DASH diet and commitment to daily physical activity for a minimum of 30 minutes discussed and encouraged, as a part of hypertension management. The importance of attaining a healthy weight is also discussed.  BP/Weight 07/26/2015 04/13/2015 04/12/2015 02/01/2015 10/07/2014 08/31/2014 [id number]  Systolic BP 123XX123 0000000 - 123456 98 123XX123 [id number]  Diastolic BP 64 83 - 80 68 78 70  Wt. (Lbs) 213.12 - 210 210.8 212.5 218.5 219.08  BMI 33.89 - 33.91 33.01 33.27 34.21 34.3

## 2015-07-29 NOTE — Assessment & Plan Note (Signed)
Symptomatic with abnormal uA, recently  Treated for UTI, if no established infection , then urology to eval

## 2015-07-29 NOTE — Assessment & Plan Note (Signed)
Improved, no change in management 

## 2015-07-29 NOTE — Assessment & Plan Note (Signed)
Controlled, no change in medication [name] is reminded of the importance of commitment to daily physical activity for 30 minutes or more, as able and the need to limit carbohydrate intake to 30 to 60 grams per meal to help with blood sugar control.   The need to take medication as prescribed, test blood sugar as directed, and to call between visits if there is a concern that blood sugar is uncontrolled is also discussed.   [name] is reminded of the importance of daily foot exam, annual eye examination, and good blood sugar, blood pressure and cholesterol control. Updated lab needed at/ before next visit.   Diabetic Labs Latest Ref Rng 04/02/2015 02/01/2015 08/28/2014 07/20/2013 06/14/2013  HbA1c <5.7 % 6.2(H) 6.4(H) - 6.5(H) -  Microalbumin <2.0 mg/dL - - <0.2 - -  Micro/Creat Ratio 0.0 - 30.0 mg/g - - SEE NOTE - -  Chol 125 - 200 mg/dL 154 164 - 108 -  HDL >=46 mg/dL 37(L) 31(L) - 23(L) -  Calc LDL <130 mg/dL 97 113(H) - 69 -  Triglycerides <150 mg/dL 101 98 - 80 -  Creatinine 0.50 - 1.05 mg/dL 0.66 0.59 - 0.82 0.7  GFR >60.00 mL/min - - - - 114.36   BP/Weight 07/26/2015 04/13/2015 04/12/2015 02/01/2015 10/07/2014 08/31/2014 [id number]  Systolic BP 123XX123 0000000 - 123456 98 [id number]  Diastolic BP 64 83 - 80 68 78 70  Wt. (Lbs) 213.12 - 210 210.8 212.5 218.5 219.08  BMI 33.89 - 33.91 33.01 33.27 34.21 34.3   Foot/eye exam completion dates Latest Ref Rng 06/29/2015 08/28/2014  Eye Exam No Retinopathy No Retinopathy -  Foot Form Completion - - Done

## 2015-07-29 NOTE — Assessment & Plan Note (Signed)
Sym[ptoms of infection, frequency ans pain , recently treated for UTI, rept urine specimen sent for c/s. If no infection , needs urology eval

## 2015-07-30 ENCOUNTER — Telehealth: Payer: Self-pay | Admitting: Family Medicine

## 2015-07-30 NOTE — Telephone Encounter (Signed)
Pt needs I/O cath for non contaminated specimen if sh remains symptomatic as I expect that she will, she may need to see urology, but will need the new specimen sent

## 2015-07-30 NOTE — Telephone Encounter (Signed)
Called and left message for patient to return call.  

## 2015-07-30 NOTE — Telephone Encounter (Signed)
Patient aware and will come in for I/O.  She is still having pressure and frequency.

## 2015-08-21 ENCOUNTER — Other Ambulatory Visit: Payer: Self-pay | Admitting: Family Medicine

## 2015-08-27 ENCOUNTER — Other Ambulatory Visit: Payer: Self-pay | Admitting: Family Medicine

## 2015-09-03 ENCOUNTER — Encounter: Payer: Self-pay | Admitting: Internal Medicine

## 2015-09-03 ENCOUNTER — Ambulatory Visit
Admission: RE | Admit: 2015-09-03 | Discharge: 2015-09-03 | Disposition: A | Discharge status: Home / Self Care | Payer: Medicare Other | Source: Ambulatory Visit | Attending: Family Medicine | Admitting: Family Medicine

## 2015-09-03 ENCOUNTER — Ambulatory Visit (INDEPENDENT_AMBULATORY_CARE_PROVIDER_SITE_OTHER): Payer: Medicare Other | Admitting: Internal Medicine

## 2015-09-03 VITALS — BP 106/72 | HR 76 | Ht 66.0 in | Wt 215.0 lb

## 2015-09-03 DIAGNOSIS — I5022 Chronic systolic (congestive) heart failure: Secondary | ICD-10-CM

## 2015-09-03 DIAGNOSIS — I4901 Ventricular fibrillation: Secondary | ICD-10-CM

## 2015-09-03 DIAGNOSIS — Z95810 Presence of automatic (implantable) cardiac defibrillator: Secondary | ICD-10-CM

## 2015-09-03 DIAGNOSIS — I255 Ischemic cardiomyopathy: Secondary | ICD-10-CM | POA: Diagnosis not present

## 2015-09-03 DIAGNOSIS — Z1239 Encounter for other screening for malignant neoplasm of breast: Secondary | ICD-10-CM

## 2015-09-03 DIAGNOSIS — Z1231 Encounter for screening mammogram for malignant neoplasm of breast: Secondary | ICD-10-CM | POA: Diagnosis not present

## 2015-09-03 LAB — CUP PACEART INCLINIC DEVICE CHECK
Battery Remaining Longevity: 125 mo
Battery Voltage: 3 V
Date Time Interrogation Session: 20170116140912
HIGH POWER IMPEDANCE MEASURED VALUE: 80 Ohm
Implantable Lead Location: 753860
Implantable Lead Model: 6935
Lead Channel Impedance Value: 475 Ohm
Lead Channel Pacing Threshold Amplitude: 0.75 V
Lead Channel Pacing Threshold Pulse Width: 0.4 ms
Lead Channel Setting Pacing Pulse Width: 0.4 ms
Lead Channel Setting Sensing Sensitivity: 0.3 mV
MDC IDC LEAD IMPLANT DT: 20141031
MDC IDC MSMT LEADCHNL RV IMPEDANCE VALUE: 418 Ohm
MDC IDC MSMT LEADCHNL RV SENSING INTR AMPL: 7 mV
MDC IDC SET LEADCHNL RV PACING AMPLITUDE: 2.5 V
MDC IDC STAT BRADY RV PERCENT PACED: 0.01 %

## 2015-09-03 NOTE — Patient Instructions (Signed)
Medication Instructions: - no changes  Labwork: - none  Procedures/Testing: - none  Follow-Up: - Remote monitoring is used to monitor your Pacemaker of ICD from home. This monitoring reduces the number of office visits required to check your device to one time per year. It allows [place] to keep an eye on the functioning of your device to ensure it is working properly. You are scheduled for a device check from home on 12/03/15. You may send your transmission at any time that day. If you have a wireless device, the transmission will be sent automatically. After your physician reviews your transmission, you will receive a postcard with your next transmission date.  - Your physician wants you to follow-up in: 1 year with Dr. Caryl Comes. You will receive a reminder letter in the mail two months in advance. If you don't receive a letter, please call our office to schedule the follow-up appointment.  Any Additional Special Instructions Will Be Listed Below (If Applicable).

## 2015-09-03 NOTE — Progress Notes (Signed)
Electrophysiology Office Note   Date:  09/03/2015   ID:  [name], DOB 1966/06/13, MRN [id number]  PCP:  Rachel Nakayama, MD  Cardiologist:   Primary Electrophysiologist:    Chief Complaint  Patient presents with  . Pacemaker Check     History of Present Illness: [name] is a 50 y.o. female who presents today for electrophysiology evaluation.      She is s/p ICD implanted for primary prevention and 10/14 underwent generator replacement with revision of her 6949-lead.   Recent nuclear imaging (3/11) listed under NOTES-CV procedure--demonstrated ejection fraction of 30% with an infarct and no ischemia  she is a prior inferior wall MI he treated with stenting.  Echocardiogram 10/14 demonstrated "severe depressed LV function" furthermore there is "patient refused to complete exam"  She has no complaints of CP SOB is stable no edema,  Weight is u-p  Past Medical History  Diagnosis Date  . coronary artery disease     RCA stenting Myoview 2011 EF 30% infarction dilatation without ischemia  . Systolic CHF ([place])   . Automatic implantable cardiac defibrillator in situ     a. s/p prior Medtronic ICD with 6949 lead. b. s/p Gen change & lead revision 05/2013.  Marland Kitchen Other and unspecified hyperlipidemia   . Cardiomyopathy secondary     Patient has cardiomyopathy out of proportion to her ischemic heart disease  . Depression   . GERD (gastroesophageal reflux disease)   . Nicotine abuse   . Insomnia   . Migraines   . Lupus (systemic lupus erythematosus) ([place])   . Arthritis of knee   . Myocardial infarction ([place])     8 total   . Diabetes mellitus   . VF (ventricular fibrillation) ([place])     a. Hx appropriate ICD therapy for VF.  Marland Kitchen Paroxysmal [place] [facility])     Current Outpatient Prescriptions  Medication Sig Dispense Refill  . albuterol (PROVENTIL HFA;VENTOLIN HFA) 108 (90 BASE) MCG/ACT inhaler Inhale 2 puffs into the lungs every 2 (two) hours as needed for wheezing or  shortness of breath (cough). 1 Inhaler 0  . ALPRAZolam (XANAX) 1 MG tablet TAKE 1 TABLET BY MOUTH AT BEDTIME AS NEEDED FOR ANXIETY. 30 tablet 2  . aspirin 81 MG tablet Take 1 tablet (81 mg total) by mouth daily. 30 tablet 6  . Aspirin-Acetaminophen (GOODYS BODY PAIN PO) Take 1 Package by mouth daily as needed. For pain    . atorvastatin (LIPITOR) 20 MG tablet TAKE ONE TABLET BY MOUTH ONCE DAILY. 90 tablet 0  . carvedilol (COREG) 3.125 MG tablet Take 1 tablet (3.125 mg total) by mouth 2 (two) times daily. 60 tablet 12  . clopidogrel (PLAVIX) 75 MG tablet Take 1 tablet (75 mg total) by mouth daily. 30 tablet 11  . ergocalciferol (VITAMIN D2) 50000 UNITS capsule Take 1 capsule (50,000 Units total) by mouth once a week. One capsule once weekly 4 capsule 11  . fenofibrate (TRICOR) 145 MG tablet Take by mouth.    . oxyCODONE (ROXICODONE) 15 MG immediate release tablet Take 30 mg by mouth every 8 (eight) hours.     No current facility-administered medications for this visit.    Allergies:   Bee venom and Cortisone   Social History:  The patient  reports that she has been smoking Cigarettes.  She has a 5.25 pack-year smoking history. She has never used smokeless tobacco. She reports that she drinks alcohol. She reports that she does not use illicit drugs.  Family History:  The patient's family history includes Cancer in her mother; Dementia in her father; Diabetes in her brother, mother, and sister; HIV in her sister; Heart disease in her brother and mother; Hepatitis (age of onset: 62) in her mother; Hyperlipidemia in her brother, mother, and sister; Liver cancer (age of onset: 36) in her mother; Stroke (age of onset: 8) in her sister.    ROS:  Please see the history of present illness.   Positive for ,   All other systems are reviewed and negative.    PHYSICAL EXAM: VS:  BP 106/72 mmHg  Pulse 76  Ht 5\' 6"  (1.676 m)  Wt 215 lb (97.523 kg)  BMI 34.72 kg/m2 , BMI Body mass index is 34.72  kg/(m^2). GEN: Well nourished, well developed, in no acute distress HEENT: normal Neck: no JVD, carotid bruits, or masses Cardiac: RRR; no murmurs, rubs, or gallops,no edema  Respiratory:  clear to auscultation bilaterally, normal work of breathing Device pocket well healed; without hematoma or erythema.  There is no tethering  GI: soft, nontender, nondistended, + BS MS: no deformity or atrophy Skin: warm and dry,  device pocket is well healed Neuro:  Strength and sensation are intact Psych: euthymic mood, full affect  EKG:  EKG is ordered today. The ekg ordered today shows Sinus rhythm at 76 15/12/42 Axis LVIII ST-T changes inferolaterally   .   Device interrogation is reviewed today in detail.  See PaceArt for details.  Recent Labs: 10/07/2014: Hemoglobin 13.4 02/01/2015: TSH 3.042 04/02/2015: ALT 15; BUN 7; Creat 0.66; Potassium 3.8; Sodium 138  04/02/2015: Cholesterol 154; HDL 37*; LDL Cholesterol 97; Total CHOL/HDL Ratio 4.2; Triglycerides 101; VLDL 20     CrCl cannot be calculated (Patient has no serum creatinine result on file.).   Wt Readings from Last 3 Encounters:  09/03/15 215 lb (97.523 kg)  07/26/15 213 lb 1.9 oz (96.671 kg)  04/12/15 210 lb (95.255 kg)         ASSESSMENT AND PLAN:     Ischemic/nonischemic cardio myopathy  Implantable defibrillator-Medtronic S./P. revision    Adhesive capsulitis  Ventricular tachycardia  Congestive heart failure-chronic-systolic  Sleep disordered breathing.  She's had recurrent ventricular tachycardia treated successfully with antitachycardia pacing. Most of these recurred clustered on a single night in October. She is have not in the last 7 weeks.  Euvolemic continue current meds    Disposition:   FU with  Me in 12 months   Signed, Virl Axe   09/03/2015 1:21 PM      [place] [address]  [phone] (office) [phone] (fax)

## 2015-09-04 ENCOUNTER — Ambulatory Visit: Payer: Medicare Other

## 2015-09-10 DIAGNOSIS — E118 Type 2 diabetes mellitus with unspecified complications: Secondary | ICD-10-CM | POA: Diagnosis not present

## 2015-09-10 DIAGNOSIS — I10 Essential (primary) hypertension: Secondary | ICD-10-CM | POA: Diagnosis not present

## 2015-09-10 DIAGNOSIS — E785 Hyperlipidemia, unspecified: Secondary | ICD-10-CM | POA: Diagnosis not present

## 2015-09-10 DIAGNOSIS — Z1159 Encounter for screening for other viral diseases: Secondary | ICD-10-CM | POA: Diagnosis not present

## 2015-09-10 DIAGNOSIS — E559 Vitamin D deficiency, unspecified: Secondary | ICD-10-CM | POA: Diagnosis not present

## 2015-09-11 LAB — COMPLETE METABOLIC PANEL WITH GFR
ALK PHOS: 102 U/L (ref 33–130)
ALT: 20 U/L (ref 6–29)
AST: 18 U/L (ref 10–35)
Albumin: 3.9 g/dL (ref 3.6–5.1)
BUN: 8 mg/dL (ref 7–25)
CALCIUM: 9.1 mg/dL (ref 8.6–10.4)
CO2: 26 mmol/L (ref 20–31)
Chloride: 108 mmol/L (ref 98–110)
Creat: 0.66 mg/dL (ref 0.50–1.05)
Glucose, Bld: 112 mg/dL — ABNORMAL HIGH (ref 65–99)
Potassium: 4.2 mmol/L (ref 3.5–5.3)
Sodium: 139 mmol/L (ref 135–146)
Total Bilirubin: 0.6 mg/dL (ref 0.2–1.2)
Total Protein: 7.6 g/dL (ref 6.1–8.1)

## 2015-09-11 LAB — MICROALBUMIN / CREATININE URINE RATIO
CREATININE, URINE: 114 mg/dL (ref 20–320)
Microalb, Ur: <0.2 mg/dL

## 2015-09-11 LAB — HEMOGLOBIN A1C
HEMOGLOBIN A1C: 6.6 % — AB (ref <5.7)
Mean Plasma Glucose: 143 mg/dL — ABNORMAL HIGH (ref <117)

## 2015-09-11 LAB — LIPID PANEL
CHOL/HDL RATIO: 5.1 ratio — AB (ref <=5.0)
CHOLESTEROL: 159 mg/dL (ref 125–200)
HDL: 31 mg/dL — AB (ref >=46)
LDL Cholesterol: 107 mg/dL (ref <130)
Triglycerides: 107 mg/dL (ref <150)
VLDL: 21 mg/dL (ref <30)

## 2015-09-11 LAB — VITAMIN D 25 HYDROXY (VIT D DEFICIENCY, FRACTURES): VIT D 25 HYDROXY: 31 ng/mL (ref 30–100)

## 2015-09-11 LAB — HEPATITIS C ANTIBODY: HCV AB: NEGATIVE

## 2015-09-19 ENCOUNTER — Encounter: Payer: Self-pay | Admitting: Family Medicine

## 2015-09-19 ENCOUNTER — Ambulatory Visit (INDEPENDENT_AMBULATORY_CARE_PROVIDER_SITE_OTHER): Payer: Medicare Other | Admitting: Family Medicine

## 2015-09-19 VITALS — BP 118/72 | HR 86 | Resp 16 | Ht 66.0 in | Wt 215.0 lb

## 2015-09-19 DIAGNOSIS — I10 Essential (primary) hypertension: Secondary | ICD-10-CM

## 2015-09-19 DIAGNOSIS — E118 Type 2 diabetes mellitus with unspecified complications: Secondary | ICD-10-CM

## 2015-09-19 DIAGNOSIS — E785 Hyperlipidemia, unspecified: Secondary | ICD-10-CM

## 2015-09-19 DIAGNOSIS — F17218 Nicotine dependence, cigarettes, with other nicotine-induced disorders: Secondary | ICD-10-CM

## 2015-09-19 DIAGNOSIS — Z0000 Encounter for general adult medical examination without abnormal findings: Secondary | ICD-10-CM

## 2015-09-19 MED ORDER — ALPRAZOLAM 1 MG PO TABS: ORAL_TABLET | ORAL | Status: DC

## 2015-09-19 NOTE — Patient Instructions (Addendum)
Annual physical exam in 5.5 month, call if you need me sooner  Fasting lipid, cmp and EGFr, hBAQ1C in 5.5 months  CUT BACK ON PASTA and CHEESE  4 cigarettes per day for Feb, 3 for March, PLAN TO QUIT!  Thanks for choosing [facility], we consider it a privelige to serve you.  All the best

## 2015-09-19 NOTE — Progress Notes (Signed)
Subjective:    Patient ID: [name], female    DOB: 1966-06-05, 50 y.o.   MRN: [id number]  HPI  Preventive Screening-Counseling & Management   Patient present here today for a Medicare annual wellness visit.   Current Problems (verified)   Medications Prior to Visit Allergies (verified)   PAST HISTORY  Family History (verified)   Social History  Never been married, had 2 children and adopted 1 son. Disabled since 2000. Current smoker    Risk Factors  Current exercise habits:  None currently   Dietary issues discussed: encouraged to limit fried foods and eat more vegetables and baked foods   Cardiac risk factors: dm type 2, recurrent  V fib   Depression Screen  (Note: if answer to either of the following is "Yes", a more complete depression screening is indicated)   Over the past two weeks, have you felt down, depressed or hopeless? No  Over the past two weeks, have you felt little interest or pleasure in doing things? No  Have you lost interest or pleasure in daily life? No  Do you often feel hopeless? No  Do you cry easily over simple problems? No   Activities of Daily Living  In your present state of health, do you have any difficulty performing the following activities?  Driving?: No Managing money?: No Feeding yourself?:No Getting from bed to chair?:No Climbing a flight of stairs?: a little with her knee pain  Preparing food and eating?:No Bathing or showering?:No Getting dressed?:No Getting to the toilet?:No Using the toilet?:No Moving around from place to place?: No  Fall Risk Assessment In the past year have you fallen or had a near fall?:No Are you currently taking any medications that make you dizzy?:No   Hearing Difficulties: No Do you often ask people to speak up or repeat themselves?:No Do you experience ringing or noises in your ears?:No Do you have difficulty understanding soft or whispered voices?:No  Cognitive Testing  Alert?  Yes Normal Appearance?Yes  Oriented to person? Yes Place? Yes  Time? Yes  Displays appropriate judgment?Yes  Can read the correct time from a watch face? yes Are you having problems remembering things?No  Advanced Directives have been discussed with the patient?Yes, brochure given , full code, needs documented living will, encouraged to do so   List the Names of Other Physician/Practitioners you currently use:  Dr Caryl Comes (cardiology) Dr Bronson Curb (treats her lupus)    Indicate any recent Medical Services you may have received from other than [facility] providers in the past year (date may be approximate).   Assessment:    Annual Wellness Exam   Plan:      Medicare Attestation  I have personally reviewed:  The patient's medical and social history  Their use of alcohol, tobacco or illicit drugs  Their current medications and supplements  The patient's functional ability including ADLs,fall risks, home safety risks, cognitive, and hearing and visual impairment  Diet and physical activities  Evidence for depression or mood disorders  The patient's weight, height, BMI, and visual acuity have been recorded in the chart. I have made referrals, counseling, and provided education to the patient based on review of the above and I have provided the patient with a written personalized care plan for preventive services.     Review of Systems     Objective:   Physical Exam  BP 118/72 mmHg  Pulse 86  Resp 16  Ht 5\' 6"  (1.676 m)  Wt 215 lb (97.523  kg)  BMI 34.72 kg/m2  SpO2 95%       Assessment & Plan:  Medicare annual wellness visit, subsequent Annual exam as documented. Counseling done  re healthy lifestyle involving commitment to 150 minutes exercise per week, heart healthy diet, and attaining healthy weight.The importance of adequate sleep also discussed. Regular seat belt use and home safety, is also discussed. Changes in health habits are decided on by the patient with goals  and time frames  set for achieving them. Immunization and cancer screening needs are specifically addressed at this visit.   Nicotine dependence Patient counseled for approximately 5 minutes regarding the health risks of ongoing nicotine use, specifically all types of cancer, heart disease, stroke and respiratory failure. The options available for help with cessation ,the behavioral changes to assist the process, and the option to either gradully reduce usage  Or abruptly stop.is also discussed. Pt is also encouraged to set specific goals in number of cigarettes used daily, as well as to set a quit date.  Number of cigarettes/cigars currently smoking daily: 5

## 2015-09-23 DIAGNOSIS — Z0000 Encounter for general adult medical examination without abnormal findings: Secondary | ICD-10-CM | POA: Insufficient documentation

## 2015-09-23 NOTE — Assessment & Plan Note (Signed)

## 2015-09-23 NOTE — Assessment & Plan Note (Signed)

## 2015-10-04 ENCOUNTER — Other Ambulatory Visit: Payer: Self-pay | Admitting: Internal Medicine

## 2015-10-10 ENCOUNTER — Encounter (HOSPITAL_COMMUNITY): Payer: Self-pay | Admitting: Emergency Medicine

## 2015-10-10 DIAGNOSIS — E119 Type 2 diabetes mellitus without complications: Secondary | ICD-10-CM | POA: Diagnosis not present

## 2015-10-10 DIAGNOSIS — I2510 Atherosclerotic heart disease of native coronary artery without angina pectoris: Secondary | ICD-10-CM | POA: Diagnosis not present

## 2015-10-10 DIAGNOSIS — F17210 Nicotine dependence, cigarettes, uncomplicated: Secondary | ICD-10-CM | POA: Insufficient documentation

## 2015-10-10 DIAGNOSIS — R0602 Shortness of breath: Secondary | ICD-10-CM | POA: Insufficient documentation

## 2015-10-10 DIAGNOSIS — I5020 Unspecified systolic (congestive) heart failure: Secondary | ICD-10-CM | POA: Insufficient documentation

## 2015-10-10 DIAGNOSIS — I252 Old myocardial infarction: Secondary | ICD-10-CM | POA: Insufficient documentation

## 2015-10-10 DIAGNOSIS — R079 Chest pain, unspecified: Secondary | ICD-10-CM | POA: Diagnosis not present

## 2015-10-10 DIAGNOSIS — R61 Generalized hyperhidrosis: Secondary | ICD-10-CM | POA: Diagnosis not present

## 2015-10-10 DIAGNOSIS — R110 Nausea: Secondary | ICD-10-CM | POA: Insufficient documentation

## 2015-10-10 NOTE — ED Notes (Signed)
Pt. reports central chest pain with SOB , nausea and diaphoresis onset this evening , denies cough or emesis .

## 2015-10-11 ENCOUNTER — Emergency Department (HOSPITAL_COMMUNITY)
Admission: EM | Admit: 2015-10-11 | Discharge: 2015-10-11 | Disposition: A | Discharge status: Home / Self Care | Payer: Medicare Other | Attending: Emergency Medicine | Admitting: Emergency Medicine

## 2015-10-11 ENCOUNTER — Emergency Department (HOSPITAL_COMMUNITY): Payer: Medicare Other

## 2015-10-11 ENCOUNTER — Telehealth: Payer: Self-pay | Admitting: Internal Medicine

## 2015-10-11 DIAGNOSIS — R0602 Shortness of breath: Secondary | ICD-10-CM | POA: Diagnosis not present

## 2015-10-11 LAB — BASIC METABOLIC PANEL
ANION GAP: 9 (ref 5–15)
BUN: 6 mg/dL (ref 6–20)
CO2: 23 mmol/L (ref 22–32)
Calcium: 8.9 mg/dL (ref 8.9–10.3)
Chloride: 110 mmol/L (ref 101–111)
Creatinine, Ser: 0.74 mg/dL (ref 0.44–1.00)
GFR calc Af Amer: >60 mL/min (ref >60)
GFR calc non Af Amer: >60 mL/min (ref >60)
GLUCOSE: 113 mg/dL — AB (ref 65–99)
Potassium: 3.9 mmol/L (ref 3.5–5.1)
Sodium: 142 mmol/L (ref 135–145)

## 2015-10-11 LAB — CBC
HEMATOCRIT: 40.7 % (ref 36.0–46.0)
HEMOGLOBIN: 13.1 g/dL (ref 12.0–15.0)
MCH: 27 pg (ref 26.0–34.0)
MCHC: 32.2 g/dL (ref 30.0–36.0)
MCV: 83.7 fL (ref 78.0–100.0)
Platelets: 291 10*3/uL (ref 150–400)
RBC: 4.86 MIL/uL (ref 3.87–5.11)
RDW: 13 % (ref 11.5–15.5)
WBC: 8.2 10*3/uL (ref 4.0–10.5)

## 2015-10-11 LAB — I-STAT TROPONIN, ED: Troponin i, poc: 0.01 ng/mL (ref 0.00–0.08)

## 2015-10-11 NOTE — ED Notes (Signed)
Pt states that her chest pain is a lot better and she is leaving.

## 2015-10-11 NOTE — Telephone Encounter (Signed)
NeW Message  Pt was seen in ED last night/this am 2/22-2/23. Pt wanted to discuss the xrays and tests that were performed yesterday in the ED w/ RN. Please call back and discuss.

## 2015-10-15 NOTE — Telephone Encounter (Signed)
I called and spoke with the patient. I advised her that her labs and chest x-ray from the ER were negative. She had an EKG that showed NSR with PVC's. I advised her that some of the chest pressure that she was having may have been related to the PVC's. She advised that she was in the waiting room for 3 hours- she left and went home and used her inhaler. She states this did help with her symptoms of chest heaviness. She has been doing ok since that time. I advised her to call back with any further concerns. She is agreeable.

## 2015-10-30 DIAGNOSIS — H40003 Preglaucoma, unspecified, bilateral: Secondary | ICD-10-CM | POA: Diagnosis not present

## 2015-10-30 DIAGNOSIS — H35361 Drusen (degenerative) of macula, right eye: Secondary | ICD-10-CM | POA: Diagnosis not present

## 2015-10-30 DIAGNOSIS — Z79899 Other long term (current) drug therapy: Secondary | ICD-10-CM | POA: Diagnosis not present

## 2015-10-30 DIAGNOSIS — Z83511 Family history of glaucoma: Secondary | ICD-10-CM | POA: Diagnosis not present

## 2015-10-30 DIAGNOSIS — H2513 Age-related nuclear cataract, bilateral: Secondary | ICD-10-CM | POA: Diagnosis not present

## 2015-10-30 DIAGNOSIS — M329 Systemic lupus erythematosus, unspecified: Secondary | ICD-10-CM | POA: Diagnosis not present

## 2015-10-30 DIAGNOSIS — Z7982 Long term (current) use of aspirin: Secondary | ICD-10-CM | POA: Diagnosis not present

## 2015-11-08 ENCOUNTER — Encounter (HOSPITAL_COMMUNITY): Payer: Self-pay | Admitting: Emergency Medicine

## 2015-11-08 ENCOUNTER — Ambulatory Visit (INDEPENDENT_AMBULATORY_CARE_PROVIDER_SITE_OTHER): Payer: Medicare Other | Admitting: Family Medicine

## 2015-11-08 ENCOUNTER — Emergency Department (HOSPITAL_COMMUNITY)
Admission: EM | Admit: 2015-11-08 | Discharge: 2015-11-08 | Disposition: A | Discharge status: Home / Self Care | Payer: Medicare Other | Attending: Emergency Medicine | Admitting: Emergency Medicine

## 2015-11-08 ENCOUNTER — Encounter: Payer: Self-pay | Admitting: Family Medicine

## 2015-11-08 VITALS — BP 124/84 | HR 84 | Resp 16 | Ht 66.0 in | Wt 215.0 lb

## 2015-11-08 DIAGNOSIS — Z7982 Long term (current) use of aspirin: Secondary | ICD-10-CM | POA: Insufficient documentation

## 2015-11-08 DIAGNOSIS — Z79899 Other long term (current) drug therapy: Secondary | ICD-10-CM | POA: Insufficient documentation

## 2015-11-08 DIAGNOSIS — R1010 Upper abdominal pain, unspecified: Secondary | ICD-10-CM | POA: Diagnosis not present

## 2015-11-08 DIAGNOSIS — I5020 Unspecified systolic (congestive) heart failure: Secondary | ICD-10-CM | POA: Insufficient documentation

## 2015-11-08 DIAGNOSIS — F17210 Nicotine dependence, cigarettes, uncomplicated: Secondary | ICD-10-CM | POA: Diagnosis not present

## 2015-11-08 DIAGNOSIS — E784 Other hyperlipidemia: Secondary | ICD-10-CM | POA: Diagnosis not present

## 2015-11-08 DIAGNOSIS — R109 Unspecified abdominal pain: Secondary | ICD-10-CM

## 2015-11-08 DIAGNOSIS — I2510 Atherosclerotic heart disease of native coronary artery without angina pectoris: Secondary | ICD-10-CM | POA: Diagnosis not present

## 2015-11-08 DIAGNOSIS — E118 Type 2 diabetes mellitus with unspecified complications: Secondary | ICD-10-CM | POA: Diagnosis not present

## 2015-11-08 DIAGNOSIS — M545 Low back pain, unspecified: Secondary | ICD-10-CM

## 2015-11-08 DIAGNOSIS — F329 Major depressive disorder, single episode, unspecified: Secondary | ICD-10-CM | POA: Insufficient documentation

## 2015-11-08 DIAGNOSIS — I252 Old myocardial infarction: Secondary | ICD-10-CM | POA: Diagnosis not present

## 2015-11-08 DIAGNOSIS — Z79891 Long term (current) use of opiate analgesic: Secondary | ICD-10-CM | POA: Diagnosis not present

## 2015-11-08 DIAGNOSIS — I10 Essential (primary) hypertension: Secondary | ICD-10-CM

## 2015-11-08 LAB — URINALYSIS, ROUTINE W REFLEX MICROSCOPIC
BILIRUBIN URINE: NEGATIVE
Glucose, UA: NEGATIVE mg/dL
HGB URINE DIPSTICK: NEGATIVE
KETONES UR: NEGATIVE mg/dL
LEUKOCYTES UA: NEGATIVE
Nitrite: NEGATIVE
PH: 6 (ref 5.0–8.0)
PROTEIN: NEGATIVE mg/dL
Specific Gravity, Urine: 1.01 (ref 1.005–1.030)

## 2015-11-08 LAB — POCT URINALYSIS DIPSTICK
BILIRUBIN UA: NEGATIVE
Glucose, UA: NEGATIVE
KETONES UA: NEGATIVE
LEUKOCYTES UA: NEGATIVE
Nitrite, UA: NEGATIVE
PH UA: 6
Protein, UA: NEGATIVE
Spec Grav, UA: 1.02
Urobilinogen, UA: 0.2

## 2015-11-08 MED ORDER — METHOCARBAMOL 500 MG PO TABS
1000.0000 mg | ORAL_TABLET | Freq: Once | ORAL | Status: AC
  Administered 2015-11-08: 1000 mg via ORAL
  Filled 2015-11-08: qty 2

## 2015-11-08 MED ORDER — METHOCARBAMOL 500 MG PO TABS: 500.0000 mg | ORAL_TABLET | Freq: Four times a day (QID) | ORAL | Status: DC | PRN

## 2015-11-08 MED ORDER — CYCLOBENZAPRINE HCL 5 MG PO TABS: 5.0000 mg | ORAL_TABLET | Freq: Three times a day (TID) | ORAL | Status: DC | PRN

## 2015-11-08 MED ORDER — DIPHENHYDRAMINE HCL 25 MG PO CAPS
50.0000 mg | ORAL_CAPSULE | Freq: Once | ORAL | Status: AC
  Administered 2015-11-08: 50 mg via ORAL
  Filled 2015-11-08: qty 2

## 2015-11-08 NOTE — Progress Notes (Signed)
   Subjective:    Patient ID: [name], female    DOB: Jan 24, 1966, 50 y.o.   MRN: [id number]  HPI 2 day h/o severe low back pain, primarily in lumbar area, o h/o kidney stones, no nause , no visible blood in urine. Pain is so severe, limits movement, no position of comfort when lying down, notes increased SOB Currently on pain medications which provide no relief  Review of Systems See HPI Denies recent fever or chills. Denies sinus pressure, nasal congestion, ear pain or sore throat. Denies chest congestion, productive cough or wheezing. Denies chest pains, palpitations and leg swelling Denies abdominal pain, nausea, vomiting,diarrhea or constipation.   Denies dysuria, frequency, hesitancy or incontinence.  Denies headaches, seizures, numbness, or tingling. Denies depression, anxiety  Denies skin break down or rash.        Objective:   Physical Exam BP 124/84 mmHg  Pulse 84  Resp 16  Ht 5\' 6"  (1.676 m)  Wt 215 lb (97.523 kg)  BMI 34.72 kg/m2  SpO2 100%  Patient alert and oriented and in no cardiopulmonary distress.In extreme pain, abnormal gait and dyspneic with minimal activity  HEENT: No facial asymmetry, EOMI,   oropharynx pink and moist.  Neck supple no JVD, no mass.  Chest: Clear to auscultation bilaterally.  CVS: S1, S2 no murmurs, no S3.Regular rate.  ABD: Soft non tender.   Ext: No edema  [id number] ROM spine,with lumbar spasm, normal in  shoulders  Skin: Intact, no ulcerations or rash noted.  Psych: Good eye contact, normal affect. Memory intact not anxious or depressed appearing.  CNS: CN 2-12 intact, power,  normal throughout.no focal deficits noted.        Assessment & Plan:  Acute flank pain 2 day h/o severe flank pain left greater than right UA in office normal Due to severity of pain in pt on chronic pain meds, no response to medication, also with cardiomyopathy and heart disease, defibrillator present, referred to ED for  further evaluation as unclear to me cause of her severe disabling pain Directly discussed with ED physician  HTN, goal below 130/80 Controlled, no change in medication   Diabetes mellitus type 2 with complications Controlled, no change in medication

## 2015-11-08 NOTE — Patient Instructions (Signed)
F/u in July as before  Because of the severity of your pain and the fact that I am unable to determine the cause, as discussed, I have spoken directly with the Ed Doctor at [facility], you need to go to the Ed directly for further evaluation

## 2015-11-08 NOTE — ED Provider Notes (Signed)
CSN: [id number]     Arrival date & time 11/08/15  1011 History   First MD Initiated Contact with Patient 11/08/15 1059     Chief Complaint  Patient presents with  . Back Pain     (Consider location/radiation/quality/duration/timing/severity/associated sxs/prior Treatment) The history is provided by the patient.   [name] is a 50 y.o. female presenting for evaluation of left lower back and upper buttock pain which slowly started 3 days ago.  She denies injury or falls and reports her pain started gradually, initially felt like a muscle spasm.  She was seen by her pcp this am at which time a UA was completed, patient states there was a trace of blood otherwise negative. She denies dysuria, hematuria, denies increased frequency of urination or urgency.  She also denies incontinence, retention or any radiation of pain into her leg and has had no weakness or numbness.  She takes oxycodone 15 mg tabs, 2 tabs tid prescribed by her lupus specialist in [place], her last dose did not noticeably improve her pain. Pain is worsened with movement and palpation but also feels at rest.    Past Medical History  Diagnosis Date  . coronary artery disease     RCA stenting Myoview 2011 EF 30% infarction dilatation without ischemia  . Systolic CHF ([place])   . Automatic implantable cardiac defibrillator in situ     a. s/p prior Medtronic ICD with 6949 lead. b. s/p Gen change & lead revision 05/2013.  Marland Kitchen Other and unspecified hyperlipidemia   . Cardiomyopathy secondary     Patient has cardiomyopathy out of proportion to her ischemic heart disease  . Depression   . GERD (gastroesophageal reflux disease)   . Nicotine abuse   . Insomnia   . Migraines   . Lupus (systemic lupus erythematosus) ([place])   . Arthritis of knee   . Myocardial infarction ([place])     8 total   . Diabetes mellitus   . VF (ventricular fibrillation) ([place])     a. Hx appropriate ICD therapy for VF.  Marland Kitchen Paroxysmal [place] [facility])    Past  Surgical History  Procedure Laterality Date  . Cholecystectomy    . Abdominal hysterectomy    . Rotator cuff repair Right 2002  . Defibrillator placed   2009 and 05/2013  . Colonoscopy  07/15/2011    SLF: internal hemorrhoids/hyperplastic polyps in the rectum/tubular adenomaSURVEILLANCE Nov 2017  . Icd generator change  06/17/2013    Dr Caryl Comes  . Lead revision N/A 06/17/2013    Procedure: LEAD REVISION;  Surgeon: Deboraha Sprang, MD;  Location: [facility] CATH LAB;  Service: Cardiovascular;  Laterality: N/A;  . Implantable cardioverter defibrillator (icd) generator change Left 06/17/2013    Procedure: ICD GENERATOR CHANGE;  Surgeon: Deboraha Sprang, MD;  Location: [facility] CATH LAB;  Service: Cardiovascular;  Laterality: Left;   Family History  Problem Relation Age of Onset  . Hepatitis Mother 38    HCV  . Liver cancer Mother 10  . Cancer Mother   . Diabetes Mother   . Heart disease Mother   . Hyperlipidemia Mother   . Stroke Sister 45    x3  . Diabetes Sister   . Hyperlipidemia Sister   . Dementia Father   . HIV Sister   . Diabetes Brother   . Heart disease Brother   . Hyperlipidemia Brother    Social History  Substance Use Topics  . Smoking status: Current Every Day Smoker -- 0.75 packs/day  for 7 years    Types: Cigarettes  . Smokeless tobacco: Never Used  . Alcohol Use: 0.0 oz/week    0 Standard drinks or equivalent per week     Comment: occasionaly   OB History    No data available     Review of Systems  Constitutional: Negative for fever and chills.  Respiratory: Negative for shortness of breath.   Cardiovascular: Negative for chest pain and leg swelling.  Gastrointestinal: Negative for abdominal pain, constipation and abdominal distention.  Genitourinary: Negative for dysuria, urgency, frequency, flank pain and difficulty urinating.  Musculoskeletal: Positive for back pain. Negative for joint swelling and gait problem.  Skin: Negative for rash.  Neurological: Negative for  weakness and numbness.      Allergies  Bee venom; Cortisone; and Robaxin  Home Medications   Prior to Admission medications   Medication Sig Start Date End Date Taking? Authorizing Provider  albuterol (PROVENTIL HFA;VENTOLIN HFA) 108 (90 BASE) MCG/ACT inhaler Inhale 2 puffs into the lungs every 2 (two) hours as needed for wheezing or shortness of breath (cough). [id number]  Yes Delora Fuel, MD  ALPRAZolam Duanne Moron) 1 MG tablet TAKE 1 TABLET BY MOUTH AT BEDTIME AS NEEDED FOR ANXIETY. 09/19/15  Yes Fayrene Helper, MD  aspirin 81 MG tablet Take 1 tablet (81 mg total) by mouth daily. 09/26/13  Yes Deboraha Sprang, MD  Aspirin-Acetaminophen (GOODYS BODY PAIN PO) Take 1 Package by mouth daily as needed. For pain   Yes Historical Provider, MD  atorvastatin (LIPITOR) 20 MG tablet TAKE ONE TABLET BY MOUTH ONCE DAILY. 08/23/15  Yes Fayrene Helper, MD  carvedilol (COREG) 3.125 MG tablet Take 1 tablet (3.125 mg total) by mouth 2 (two) times daily. 08/31/14  Yes Deboraha Sprang, MD  clopidogrel (PLAVIX) 75 MG tablet TAKE 1 TABLET BY MOUTH ONCE A DAY. 10/04/15  Yes Deboraha Sprang, MD  ergocalciferol (VITAMIN D2) 50000 UNITS capsule Take 1 capsule (50,000 Units total) by mouth once a week. One capsule once weekly Patient taking differently: Take 50,000 Units by mouth once a week.  07/26/15  Yes Fayrene Helper, MD  fenofibrate (TRICOR) 145 MG tablet Take 145 mg by mouth daily. Take by mouth. 04/15/13  Yes Historical Provider, MD  oxyCODONE (ROXICODONE) 15 MG immediate release tablet Take 30 mg by mouth every 8 (eight) hours.   Yes Historical Provider, MD  cyclobenzaprine (FLEXERIL) 5 MG tablet Take 1 tablet (5 mg total) by mouth 3 (three) times daily as needed for muscle spasms. 11/08/15   Evalee Jefferson, PA-C   BP 129/88 mmHg  Pulse 76  Temp(Src) 98.4 F (36.9 C) (Oral)  Resp 17  Ht 5\' 7"  (1.702 m)  Wt 97.523 kg  BMI 33.67 kg/m2  SpO2 100% Physical Exam  Constitutional: She appears well-developed and  well-nourished.  HENT:  Head: Normocephalic.  Eyes: Conjunctivae are normal.  Neck: Normal range of motion. Neck supple.  Cardiovascular: Normal rate and intact distal pulses.   Pedal pulses normal.  Pulmonary/Chest: Effort normal.  Abdominal: Soft. Bowel sounds are normal. She exhibits no distension and no mass.  Musculoskeletal: Normal range of motion. She exhibits no edema.       Lumbar back: She exhibits tenderness. She exhibits no swelling, no edema and no spasm.       Back:  ttp left lower paralumbar soft tissue into left upper buttock.  No edema, no induration, no rash.  No midline lumbar pain.  Neurological: She is alert. She has  normal strength. She displays no atrophy and no tremor. No sensory deficit. Gait normal.  Reflex Scores:      Patellar reflexes are 2+ on the right side and 2+ on the left side.      Achilles reflexes are 2+ on the right side and 2+ on the left side. No strength deficit noted in hip and knee flexor and extensor muscle groups.  Ankle flexion and extension intact.  Skin: Skin is warm and dry.  Psychiatric: She has a normal mood and affect.  Nursing note and vitals reviewed.   ED Course  Procedures (including critical care time) Labs Review Labs Reviewed  URINALYSIS, ROUTINE W REFLEX MICROSCOPIC (NOT AT [facility])    Imaging Review No results found. I have personally reviewed and evaluated these images and lab results as part of my medical decision-making.   EKG Interpretation None      MDM   Final diagnoses:  Left-sided low back pain without sciatica    Pt's exam c/w musculoskeletal strain.  No midline pain.  No neuro deficits.  She was given robaxin while here with some improvement in pain, but she developed itching.  She was given benadryl here.  Will be prescribed flexeril instead. Discussed heat tx tid.  Prn f/u with pcp if sx persist or worsen.  No neuro deficit on exam or by history to suggest emergent or surgical presentation.  Also  discussed worsened sx that should prompt immediate re-evaluation including distal weakness, bowel/bladder retention/incontinence.          Evalee Jefferson, PA-C 11/08/15 [place], MD 11/09/15 [phone]

## 2015-11-08 NOTE — Discharge Instructions (Signed)

## 2015-11-08 NOTE — ED Notes (Signed)
Pt c/o left lower back pain x 2 days. Denies injury. Pt states pain radiates into hip. Denies numbness/tingling. Denies gi/gu sx. lnbm yesterday. Pt states she was checked for uti at Dr. Griffin Dakin office this am and results were negative. nad noted.

## 2015-11-08 NOTE — ED Notes (Addendum)
Pt reports left sided flank pain x2 days, denies urinary symptoms. Pt went to Dr. Griffin Dakin office this morning and had urine checked (negative). PT was sent here for further evaluation.

## 2015-11-10 ENCOUNTER — Encounter: Payer: Self-pay | Admitting: Family Medicine

## 2015-11-10 ENCOUNTER — Ambulatory Visit (INDEPENDENT_AMBULATORY_CARE_PROVIDER_SITE_OTHER): Payer: Medicare Other | Admitting: Family Medicine

## 2015-11-10 VITALS — BP 110/78 | HR 82 | Temp 98.2°F | Resp 16 | Ht 68.0 in | Wt 213.4 lb

## 2015-11-10 DIAGNOSIS — R109 Unspecified abdominal pain: Secondary | ICD-10-CM | POA: Insufficient documentation

## 2015-11-10 NOTE — Patient Instructions (Signed)
Please let me know if you're not feeling better with the muscle relaxers and pain medicine you have at home in the next

## 2015-11-10 NOTE — Assessment & Plan Note (Signed)
2 day h/o severe flank pain left greater than right UA in office normal Due to severity of pain in pt on chronic pain meds, no response to medication, also with cardiomyopathy and heart disease, defibrillator present, referred to ED for further evaluation as unclear to me cause of her severe disabling pain Directly discussed with ED physician
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
## 2015-11-10 NOTE — Assessment & Plan Note (Signed)
Controlled, no change in medication  

## 2015-12-03 ENCOUNTER — Ambulatory Visit (INDEPENDENT_AMBULATORY_CARE_PROVIDER_SITE_OTHER): Payer: Medicare Other | Admitting: *Deleted

## 2015-12-03 DIAGNOSIS — Z95810 Presence of automatic (implantable) cardiac defibrillator: Secondary | ICD-10-CM | POA: Diagnosis not present

## 2015-12-03 DIAGNOSIS — I255 Ischemic cardiomyopathy: Secondary | ICD-10-CM

## 2015-12-03 NOTE — Progress Notes (Signed)
Remote ICD transmission.   

## 2015-12-07 ENCOUNTER — Other Ambulatory Visit: Payer: Self-pay | Admitting: Internal Medicine

## 2016-01-09 ENCOUNTER — Encounter: Payer: Self-pay | Admitting: Cardiology

## 2016-01-09 LAB — CUP PACEART REMOTE DEVICE CHECK
Battery Remaining Longevity: 123 mo
Battery Voltage: 3.02 V
Brady Statistic RV Percent Paced: 0.01 %
HIGH POWER IMPEDANCE MEASURED VALUE: 81 Ohm
Lead Channel Impedance Value: 513 Ohm
Lead Channel Impedance Value: 513 Ohm
Lead Channel Pacing Threshold Amplitude: 0.625 V
Lead Channel Setting Pacing Amplitude: 2.5 V
Lead Channel Setting Sensing Sensitivity: 0.3 mV
MDC IDC LEAD IMPLANT DT: 20141031
MDC IDC LEAD LOCATION: 753860
MDC IDC LEAD MODEL: 6935
MDC IDC MSMT LEADCHNL RV PACING THRESHOLD PULSEWIDTH: 0.4 ms
MDC IDC MSMT LEADCHNL RV SENSING INTR AMPL: 7.25 mV
MDC IDC MSMT LEADCHNL RV SENSING INTR AMPL: 7.25 mV
MDC IDC SESS DTM: 20170417082402
MDC IDC SET LEADCHNL RV PACING PULSEWIDTH: 0.4 ms

## 2016-01-17 ENCOUNTER — Other Ambulatory Visit: Payer: Self-pay

## 2016-01-17 DIAGNOSIS — E559 Vitamin D deficiency, unspecified: Secondary | ICD-10-CM

## 2016-01-17 MED ORDER — ERGOCALCIFEROL 1.25 MG (50000 UT) PO CAPS: 50000.0000 [IU] | ORAL_CAPSULE | ORAL | Status: DC

## 2016-01-18 ENCOUNTER — Other Ambulatory Visit: Payer: Self-pay | Admitting: *Deleted

## 2016-01-18 MED ORDER — CARVEDILOL 3.125 MG PO TABS: ORAL_TABLET | ORAL | Status: DC

## 2016-01-18 MED ORDER — CLOPIDOGREL BISULFATE 75 MG PO TABS: 75.0000 mg | ORAL_TABLET | Freq: Every day | ORAL | Status: DC

## 2016-01-23 ENCOUNTER — Encounter: Payer: Self-pay | Admitting: Cardiology

## 2016-03-03 ENCOUNTER — Ambulatory Visit (INDEPENDENT_AMBULATORY_CARE_PROVIDER_SITE_OTHER): Payer: Medicare Other | Admitting: *Deleted

## 2016-03-03 DIAGNOSIS — I255 Ischemic cardiomyopathy: Secondary | ICD-10-CM | POA: Diagnosis not present

## 2016-03-03 NOTE — Progress Notes (Signed)
Remote ICD transmission.   

## 2016-03-05 ENCOUNTER — Telehealth: Payer: Self-pay | Admitting: *Deleted

## 2016-03-05 ENCOUNTER — Encounter: Payer: Self-pay | Admitting: Cardiology

## 2016-03-05 LAB — CUP PACEART REMOTE DEVICE CHECK
Battery Remaining Longevity: 121 mo
Battery Voltage: 3.01 V
HighPow Impedance: 77 Ohm
Implantable Lead Implant Date: 20141031
Implantable Lead Location: 753860
Implantable Lead Model: 6935
Lead Channel Pacing Threshold Pulse Width: 0.4 ms
Lead Channel Setting Pacing Amplitude: 2.5 V
Lead Channel Setting Pacing Pulse Width: 0.4 ms
Lead Channel Setting Sensing Sensitivity: 0.3 mV
MDC IDC MSMT LEADCHNL RV IMPEDANCE VALUE: 418 Ohm
MDC IDC MSMT LEADCHNL RV IMPEDANCE VALUE: 475 Ohm
MDC IDC MSMT LEADCHNL RV PACING THRESHOLD AMPLITUDE: 0.5 V
MDC IDC MSMT LEADCHNL RV SENSING INTR AMPL: 8.375 mV
MDC IDC MSMT LEADCHNL RV SENSING INTR AMPL: 8.375 mV
MDC IDC SESS DTM: 20170717041704
MDC IDC STAT BRADY RV PERCENT PACED: 0.01 %

## 2016-03-05 NOTE — Telephone Encounter (Signed)
Called [name] about VT episode treated with ATP on Saturday 03/01/16 seen on recent remote transmission. She says that she could tell her heart rate was elevated and she became short of breath. She sat down and rested and her symptoms resolved. I have made her aware of driving restrictions x 6 months. She verbalizes understanding. I will call her back with any recommendations from Dr. Caryl Comes.  She requests that her Plavix be filled for 90 day supply so that she will be able to get all of her prescriptions filled at the same time. I will forward this request to our refills department.

## 2016-03-06 ENCOUNTER — Other Ambulatory Visit: Payer: Self-pay | Admitting: Family Medicine

## 2016-03-06 NOTE — Telephone Encounter (Signed)
Was sent in on 01/18/16 for #90 with 1 refill. Spoke with patient and made her aware of this. She will call the pharmacy and call us back if anything further is needed.

## 2016-03-13 ENCOUNTER — Ambulatory Visit (INDEPENDENT_AMBULATORY_CARE_PROVIDER_SITE_OTHER): Payer: Medicare Other | Admitting: Family Medicine

## 2016-03-13 ENCOUNTER — Encounter: Payer: Self-pay | Admitting: Family Medicine

## 2016-03-13 VITALS — BP 104/68 | HR 95 | Temp 98.2°F | Resp 18 | Ht 68.0 in | Wt 207.0 lb

## 2016-03-13 DIAGNOSIS — J209 Acute bronchitis, unspecified: Secondary | ICD-10-CM | POA: Diagnosis not present

## 2016-03-13 DIAGNOSIS — F17210 Nicotine dependence, cigarettes, uncomplicated: Secondary | ICD-10-CM | POA: Diagnosis not present

## 2016-03-13 DIAGNOSIS — F17208 Nicotine dependence, unspecified, with other nicotine-induced disorders: Secondary | ICD-10-CM

## 2016-03-13 DIAGNOSIS — J0101 Acute recurrent maxillary sinusitis: Secondary | ICD-10-CM | POA: Diagnosis not present

## 2016-03-13 DIAGNOSIS — E118 Type 2 diabetes mellitus with unspecified complications: Secondary | ICD-10-CM | POA: Diagnosis not present

## 2016-03-13 DIAGNOSIS — E785 Hyperlipidemia, unspecified: Secondary | ICD-10-CM

## 2016-03-13 DIAGNOSIS — I10 Essential (primary) hypertension: Secondary | ICD-10-CM

## 2016-03-13 MED ORDER — BENZONATATE 100 MG PO CAPS: 100.0000 mg | ORAL_CAPSULE | Freq: Two times a day (BID) | ORAL | 0 refills | Status: DC | PRN

## 2016-03-13 MED ORDER — CEFTRIAXONE SODIUM 1 G IJ SOLR
500.0000 mg | Freq: Once | INTRAMUSCULAR | Status: AC
  Administered 2016-03-13: 500 mg via INTRAMUSCULAR

## 2016-03-13 MED ORDER — PROMETHAZINE-DM 6.25-15 MG/5ML PO SYRP: ORAL_SOLUTION | ORAL | 0 refills | Status: DC

## 2016-03-13 MED ORDER — FLUCONAZOLE 150 MG PO TABS
150.0000 mg | ORAL_TABLET | Freq: Once | ORAL | 0 refills | Status: AC
Start: 2016-03-13 — End: 2016-03-13

## 2016-03-13 MED ORDER — ALBUTEROL SULFATE HFA 108 (90 BASE) MCG/ACT IN AERS: 2.0000 | INHALATION_SPRAY | Freq: Four times a day (QID) | RESPIRATORY_TRACT | 0 refills | Status: DC | PRN

## 2016-03-13 MED ORDER — PSEUDOEPH-BROMPHEN-DM 30-2-10 MG/5ML PO SYRP: 5.0000 mL | ORAL_SOLUTION | Freq: Four times a day (QID) | ORAL | 0 refills | Status: DC | PRN

## 2016-03-13 MED ORDER — PENICILLIN V POTASSIUM 500 MG PO TABS: 500.0000 mg | ORAL_TABLET | Freq: Three times a day (TID) | ORAL | 0 refills | Status: DC

## 2016-03-13 MED ORDER — ALPRAZOLAM 1 MG PO TABS: ORAL_TABLET | ORAL | 4 refills | Status: DC

## 2016-03-13 NOTE — Patient Instructions (Addendum)
Annual exam in 4  Weeks, call if you need me sooner  You are treated for acute bronchitis and sinusitis,  Rocephin 500 mg iM in office, antibiotic, decongestant , cough suppressant , and fluconazole are prescribed, al;so an albuterol inhaler  Labs are past due and are needed.Lipid, cmp and eGFR, hBA1C   You Can Quit Smoking If you are ready to quit smoking or are thinking about it, congratulations! You have chosen to help yourself be healthier and live longer! There are lots of different ways to quit smoking. Nicotine gum, nicotine patches, a nicotine inhaler, or nicotine nasal spray can help with physical craving. Hypnosis, support groups, and medicines help break the habit of smoking. TIPS TO GET OFF AND STAY OFF CIGARETTES  Learn to predict your moods. Do not let a bad situation be your excuse to have a cigarette. Some situations in your life might tempt you to have a cigarette.  Ask friends and co-workers not to smoke around you.  Make your home smoke-free.  Never have "just one" cigarette. It leads to wanting another and another. Remind yourself of your decision to quit.  On a card, make a list of your reasons for not smoking. Read it at least the same number of times a day as you have a cigarette. Tell yourself everyday, "I do not want to smoke. I choose not to smoke."  Ask someone at home or work to help you with your plan to quit smoking.  Have something planned after you eat or have a cup of coffee. Take a walk or get other exercise to perk you up. This will help to keep you from overeating.  Try a relaxation exercise to calm you down and decrease your stress. Remember, you may be tense and nervous the first two weeks after you quit. This will pass.  Find new activities to keep your hands busy. Play with a pen, coin, or rubber band. Doodle or draw things on paper.  Brush your teeth right after eating. This will help cut down the craving for the taste of tobacco after meals.  You can try mouthwash too.  Try gum, breath mints, or diet candy to keep something in your mouth. IF YOU SMOKE AND WANT TO QUIT:  Do not stock up on cigarettes. Never buy a carton. Wait until one pack is finished before you buy another.  Never carry cigarettes with you at work or at home.  Keep cigarettes as far away from you as possible. Leave them with someone else.  Never carry matches or a lighter with you.  Ask yourself, "Do I need this cigarette or is this just a reflex?"  Bet with someone that you can quit. Put cigarette money in a piggy bank every morning. If you smoke, you give up the money. If you do not smoke, by the end of the week, you keep the money.  Keep trying. It takes 21 days to change a habit!  Talk to your doctor about using medicines to help you quit. These include nicotine replacement gum, lozenges, or skin patches.   This information is not intended to replace advice given to you by your health care provider. Make sure you discuss any questions you have with your health care provider.   Document Released: 05/31/2009 Document Revised: 10/27/2011 Document Reviewed: 05/31/2009 Elsevier Interactive Patient Education [employer].

## 2016-03-13 NOTE — Progress Notes (Signed)
[name]     MRN: [id number]      DOB: 04-04-1966   HPI [name] is here with a 1 week h/o cough and chest congestion, facial pressure, body aches, and chills, states had fever 3 days  Ago    ROS . Denies chest pains, palpitations and leg swelling Denies abdominal pain, nausea, vomiting,diarrhea or constipation.   Denies dysuria, frequency, hesitancy or incontinence. Denies joint pain, swelling and limitation in mobility. Denies headaches, seizures, numbness, or tingling. Denies depression, anxiety or insomnia. Denies skin break down or rash.   PE  BP 104/68 (BP Location: Left Arm, Patient Position: Sitting, Cuff Size: Large)   Pulse 95   Temp 98.2 F (36.8 C) (Oral)   Resp 18   Ht 5\' 8"  (1.727 m)   Wt 207 lb (93.9 kg)   SpO2 98%   BMI 31.47 kg/m   Patient alert and oriented and in no cardiopulmonary distress.  HEENT: No facial asymmetry, EOMI,   oropharynx pink and moist.  Neck supple no JVD, no mass.Bilateral maxillary sinus tenderness, TM clear Chest: decreased air entry, bilateral crackles and few wheezes  CVS: S1, S2 no murmurs, no S3.Regular rate.  ABD: Soft non tender.   Ext: No edema  MS: Adequate ROM spine, shoulders, hips and knees.  Skin: Intact, no ulcerations or rash noted.  Psych: Good eye contact, normal affect. Memory intact not anxious or depressed appearing.  CNS: CN 2-12 intact, power,  normal throughout.no focal deficits noted.   Assessment & Plan  BRONCHITIS, ACUTE Antibiotic course prescribed  Acute sinusitis Antibiotic course prescribed  HTN, goal below 130/80 Controlled, no change in medication DASH diet and commitment to daily physical activity for a minimum of 30 minutes discussed and encouraged, as a part of hypertension management. The importance of attaining a healthy weight is also discussed.  BP/Weight 03/13/2016 11/10/2015 11/08/2015 11/08/2015 10/11/2015 09/19/2015 [id number]  Systolic BP 123456 A999333 Q000111Q A999333 130 123456  A999333  Diastolic BP 68 78 88 84 80 72 72  Wt. (Lbs) 207 213.4 215 215 - 215 215  BMI 31.47 32.46 33.67 34.72 - 34.72 34.72       Nicotine dependence Patient counseled for approximately 5 minutes regarding the health risks of ongoing nicotine use, specifically all types of cancer, heart disease, stroke and respiratory failure. The options available for help with cessation ,the behavioral changes to assist the process, and the option to either gradully reduce usage  Or abruptly stop.is also discussed. Pt is also encouraged to set specific goals in number of cigarettes used daily, as well as to set a quit date.     Diabetes mellitus type 2 with complications Controlled, no change in medication [name] is reminded of the importance of commitment to daily physical activity for 30 minutes or more, as able and the need to limit carbohydrate intake to 30 to 60 grams per meal to help with blood sugar control.   The need to take medication as prescribed, test blood sugar as directed, and to call between visits if there is a concern that blood sugar is uncontrolled is also discussed.   [name] is reminded of the importance of daily foot exam, annual eye examination, and good blood sugar, blood pressure and cholesterol control.  Diabetic Labs Latest Ref Rng & Units 03/13/2016 10/10/2015 09/10/2015 04/02/2015 02/01/2015  HbA1c <5.7 % 6.4(H) - 6.6(H) 6.2(H) 6.4(H)  Microalbumin Not estab mg/dL - - <0.2 - -  Micro/Creat Ratio <30 mcg/mg  creat - - SEE NOTE - -  Chol 125 - 200 mg/dL 113(L) - 159 154 164  HDL >=46 mg/dL 27(L) - 31(L) 37(L) 31(L)  Calc LDL <130 mg/dL 64 - 107 97 113(H)  Triglycerides <150 mg/dL 110 - 107 101 98  Creatinine 0.50 - 1.05 mg/dL 0.81 0.74 0.66 0.66 0.59  GFR >60.00 mL/min - - - - -   BP/Weight 03/13/2016 11/10/2015 11/08/2015 11/08/2015 10/11/2015 09/19/2015 [id number]  Systolic BP 123456 A999333 Q000111Q A999333 [id number] 123456 A999333  Diastolic BP 68 78 88 84 80 72 72  Wt. (Lbs) 207 213.4 215 215  - 215 215  BMI 31.47 32.46 33.67 34.72 - 34.72 34.72   Foot/eye exam completion dates Latest Ref Rng & Units 06/29/2015 08/28/2014  Eye Exam No Retinopathy No Retinopathy -  Foot Form Completion - - Done

## 2016-03-14 LAB — HEMOGLOBIN A1C
HEMOGLOBIN A1C: 6.4 % — AB (ref <5.7)
MEAN PLASMA GLUCOSE: 137 mg/dL

## 2016-03-14 LAB — COMPLETE METABOLIC PANEL WITH GFR
ALT: 15 U/L (ref 6–29)
AST: 16 U/L (ref 10–35)
Albumin: 4 g/dL (ref 3.6–5.1)
Alkaline Phosphatase: 100 U/L (ref 33–130)
BUN: 3 mg/dL — AB (ref 7–25)
CHLORIDE: 106 mmol/L (ref 98–110)
CO2: 25 mmol/L (ref 20–31)
Calcium: 9.4 mg/dL (ref 8.6–10.4)
Creat: 0.81 mg/dL (ref 0.50–1.05)
GFR, EST NON AFRICAN AMERICAN: 84 mL/min (ref >=60)
GFR, Est African American: >89 mL/min (ref >=60)
GLUCOSE: 125 mg/dL — AB (ref 65–99)
POTASSIUM: 4.3 mmol/L (ref 3.5–5.3)
SODIUM: 139 mmol/L (ref 135–146)
Total Bilirubin: 0.7 mg/dL (ref 0.2–1.2)
Total Protein: 7.7 g/dL (ref 6.1–8.1)

## 2016-03-14 LAB — LIPID PANEL
CHOL/HDL RATIO: 4.2 ratio (ref <=5.0)
CHOLESTEROL: 113 mg/dL — AB (ref 125–200)
HDL: 27 mg/dL — AB (ref >=46)
LDL Cholesterol: 64 mg/dL (ref <130)
Triglycerides: 110 mg/dL (ref <150)
VLDL: 22 mg/dL (ref <30)

## 2016-03-16 NOTE — Assessment & Plan Note (Addendum)
Antibiotic course prescribed, and Rocephin administered ain office

## 2016-03-16 NOTE — Assessment & Plan Note (Signed)

## 2016-03-16 NOTE — Assessment & Plan Note (Signed)
Antibiotic course prescribed 

## 2016-03-16 NOTE — Assessment & Plan Note (Signed)
Controlled, no change in medication [name] is reminded of the importance of commitment to daily physical activity for 30 minutes or more, as able and the need to limit carbohydrate intake to 30 to 60 grams per meal to help with blood sugar control.   The need to take medication as prescribed, test blood sugar as directed, and to call between visits if there is a concern that blood sugar is uncontrolled is also discussed.   [name] is reminded of the importance of daily foot exam, annual eye examination, and good blood sugar, blood pressure and cholesterol control.  Diabetic Labs Latest Ref Rng & Units 03/13/2016 10/10/2015 09/10/2015 04/02/2015 02/01/2015  HbA1c <5.7 % 6.4(H) - 6.6(H) 6.2(H) 6.4(H)  Microalbumin Not estab mg/dL - - <0.2 - -  Micro/Creat Ratio <30 mcg/mg creat - - SEE NOTE - -  Chol 125 - 200 mg/dL 113(L) - 159 154 164  HDL >=46 mg/dL 27(L) - 31(L) 37(L) 31(L)  Calc LDL <130 mg/dL 64 - 107 97 113(H)  Triglycerides <150 mg/dL 110 - 107 101 98  Creatinine 0.50 - 1.05 mg/dL 0.81 0.74 0.66 0.66 0.59  GFR >60.00 mL/min - - - - -   BP/Weight 03/13/2016 11/10/2015 11/08/2015 11/08/2015 10/11/2015 09/19/2015 [id number]  Systolic BP 123456 A999333 Q000111Q A999333 [id number] 123456 A999333  Diastolic BP 68 78 88 84 80 72 72  Wt. (Lbs) 207 213.4 215 215 - 215 215  BMI 31.47 32.46 33.67 34.72 - 34.72 34.72   Foot/eye exam completion dates Latest Ref Rng & Units 06/29/2015 08/28/2014  Eye Exam No Retinopathy No Retinopathy -  Foot Form Completion - - Done

## 2016-03-16 NOTE — Assessment & Plan Note (Signed)
Controlled, no change in medication DASH diet and commitment to daily physical activity for a minimum of 30 minutes discussed and encouraged, as a part of hypertension management. The importance of attaining a healthy weight is also discussed.  BP/Weight 03/13/2016 11/10/2015 11/08/2015 11/08/2015 10/11/2015 09/19/2015 [id number]  Systolic BP 123456 A999333 Q000111Q A999333 [id number] 123456 A999333  Diastolic BP 68 78 88 84 80 72 72  Wt. (Lbs) 207 213.4 215 215 - 215 215  BMI 31.47 32.46 33.67 34.72 - 34.72 34.72

## 2016-03-18 ENCOUNTER — Other Ambulatory Visit: Payer: Self-pay

## 2016-03-18 ENCOUNTER — Telehealth: Payer: Self-pay

## 2016-03-18 ENCOUNTER — Other Ambulatory Visit: Payer: Self-pay | Admitting: Family Medicine

## 2016-03-18 MED ORDER — ATORVASTATIN CALCIUM 10 MG PO TABS: 10.0000 mg | ORAL_TABLET | Freq: Every day | ORAL | 1 refills | Status: DC

## 2016-03-18 MED ORDER — HYDROCOD POLST-CPM POLST ER 10-8 MG/5ML PO SUER: ORAL | 0 refills | Status: DC

## 2016-03-18 NOTE — Telephone Encounter (Signed)
Attempted to reach patient x 2.  Message left for patient to return call.

## 2016-03-18 NOTE — Telephone Encounter (Signed)
states the cough pills she was given is not working and she is till coughing up mucus and its so bad at night that she will vomit from all the coughing-  said she normally gets tussionex. Please advise

## 2016-03-18 NOTE — Telephone Encounter (Signed)
Will print pls send and let her know

## 2016-03-19 ENCOUNTER — Encounter: Payer: Self-pay | Admitting: Cardiology

## 2016-03-19 NOTE — Telephone Encounter (Signed)
Pt aware that she can collect her rx today

## 2016-04-01 ENCOUNTER — Telehealth: Payer: Self-pay | Admitting: Family Medicine

## 2016-04-01 DIAGNOSIS — R197 Diarrhea, unspecified: Secondary | ICD-10-CM

## 2016-04-01 DIAGNOSIS — R059 Cough, unspecified: Secondary | ICD-10-CM

## 2016-04-01 DIAGNOSIS — R05 Cough: Secondary | ICD-10-CM

## 2016-04-01 NOTE — Telephone Encounter (Signed)
Needs cXR, sputunm c/s and cBC and diff and cmp pls order , I will also see her in the office this week, before Friday, bUT get these asap Needs stool for c/s and c dif. Suggest get labs as stat at hospital tomorrow morning and also the cXR for diarrheah advise immodium sched appt for either tomorrow PM aFTER I have the lab data available or for Thursday am pls

## 2016-04-01 NOTE — Telephone Encounter (Signed)
[name] is calling and stating that her cough/cold has not gotten any better/still having chills/throwing up and diarrhea cant keep anything on her stomach/shes finished the medication that Dr. Moshe Cipro prescribed. Please advise?

## 2016-04-02 ENCOUNTER — Ambulatory Visit (HOSPITAL_COMMUNITY)
Admission: RE | Admit: 2016-04-02 | Discharge: 2016-04-02 | Disposition: A | Discharge status: Home / Self Care | Payer: Medicare Other | Source: Ambulatory Visit | Attending: Family Medicine | Admitting: Family Medicine

## 2016-04-02 ENCOUNTER — Encounter: Payer: Self-pay | Admitting: Family Medicine

## 2016-04-02 ENCOUNTER — Other Ambulatory Visit (HOSPITAL_COMMUNITY)
Admission: RE | Admit: 2016-04-02 | Discharge: 2016-04-02 | Disposition: A | Discharge status: Home / Self Care | Payer: Medicare Other | Source: Ambulatory Visit | Attending: Family Medicine | Admitting: Family Medicine

## 2016-04-02 ENCOUNTER — Ambulatory Visit (INDEPENDENT_AMBULATORY_CARE_PROVIDER_SITE_OTHER): Payer: Medicare Other | Admitting: Family Medicine

## 2016-04-02 VITALS — BP 130/64 | HR 95 | Temp 98.8°F | Resp 18 | Ht 68.0 in | Wt 200.0 lb

## 2016-04-02 DIAGNOSIS — F341 Dysthymic disorder: Secondary | ICD-10-CM

## 2016-04-02 DIAGNOSIS — R05 Cough: Secondary | ICD-10-CM | POA: Diagnosis not present

## 2016-04-02 DIAGNOSIS — F418 Other specified anxiety disorders: Secondary | ICD-10-CM

## 2016-04-02 DIAGNOSIS — J441 Chronic obstructive pulmonary disease with (acute) exacerbation: Secondary | ICD-10-CM | POA: Diagnosis not present

## 2016-04-02 DIAGNOSIS — F17208 Nicotine dependence, unspecified, with other nicotine-induced disorders: Secondary | ICD-10-CM | POA: Diagnosis not present

## 2016-04-02 DIAGNOSIS — F5105 Insomnia due to other mental disorder: Secondary | ICD-10-CM

## 2016-04-02 DIAGNOSIS — Z950 Presence of cardiac pacemaker: Secondary | ICD-10-CM | POA: Diagnosis not present

## 2016-04-02 DIAGNOSIS — I10 Essential (primary) hypertension: Secondary | ICD-10-CM | POA: Diagnosis not present

## 2016-04-02 DIAGNOSIS — R918 Other nonspecific abnormal finding of lung field: Secondary | ICD-10-CM | POA: Diagnosis not present

## 2016-04-02 DIAGNOSIS — E118 Type 2 diabetes mellitus with unspecified complications: Secondary | ICD-10-CM

## 2016-04-02 DIAGNOSIS — R059 Cough, unspecified: Secondary | ICD-10-CM

## 2016-04-02 DIAGNOSIS — R053 Chronic cough: Secondary | ICD-10-CM

## 2016-04-02 DIAGNOSIS — I517 Cardiomegaly: Secondary | ICD-10-CM | POA: Insufficient documentation

## 2016-04-02 LAB — CBC WITH DIFFERENTIAL/PLATELET
Basophils Absolute: 0.1 10*3/uL (ref 0.0–0.1)
Basophils Relative: 1 %
EOS ABS: 0.2 10*3/uL (ref 0.0–0.7)
EOS PCT: 3 %
HCT: 42.1 % (ref 36.0–46.0)
Hemoglobin: 13.7 g/dL (ref 12.0–15.0)
LYMPHS ABS: 4.3 10*3/uL — AB (ref 0.7–4.0)
Lymphocytes Relative: 53 %
MCH: 26.9 pg (ref 26.0–34.0)
MCHC: 32.5 g/dL (ref 30.0–36.0)
MCV: 82.5 fL (ref 78.0–100.0)
Monocytes Absolute: 0.4 10*3/uL (ref 0.1–1.0)
Monocytes Relative: 5 %
Neutro Abs: 3.1 10*3/uL (ref 1.7–7.7)
Neutrophils Relative %: 38 %
PLATELETS: 290 10*3/uL (ref 150–400)
RBC: 5.1 MIL/uL (ref 3.87–5.11)
RDW: 13.5 % (ref 11.5–15.5)
WBC: 8.1 10*3/uL (ref 4.0–10.5)

## 2016-04-02 LAB — COMPREHENSIVE METABOLIC PANEL
ALK PHOS: 81 U/L (ref 38–126)
ALT: 17 U/L (ref 14–54)
AST: 18 U/L (ref 15–41)
Albumin: 3.8 g/dL (ref 3.5–5.0)
Anion gap: 6 (ref 5–15)
BILIRUBIN TOTAL: 0.7 mg/dL (ref 0.3–1.2)
BUN: 10 mg/dL (ref 6–20)
CALCIUM: 8.7 mg/dL — AB (ref 8.9–10.3)
CHLORIDE: 107 mmol/L (ref 101–111)
CO2: 25 mmol/L (ref 22–32)
CREATININE: 0.61 mg/dL (ref 0.44–1.00)
GFR calc Af Amer: >60 mL/min (ref >60)
Glucose, Bld: 122 mg/dL — ABNORMAL HIGH (ref 65–99)
Potassium: 3.3 mmol/L — ABNORMAL LOW (ref 3.5–5.1)
Sodium: 138 mmol/L (ref 135–145)
Total Protein: 7.9 g/dL (ref 6.5–8.1)

## 2016-04-02 MED ORDER — PREDNISONE 5 MG (21) PO TBPK: 5.0000 mg | ORAL_TABLET | ORAL | 0 refills | Status: DC

## 2016-04-02 MED ORDER — BUDESONIDE-FORMOTEROL FUMARATE 80-4.5 MCG/ACT IN AERO: 2.0000 | INHALATION_SPRAY | Freq: Two times a day (BID) | RESPIRATORY_TRACT | 3 refills | Status: DC

## 2016-04-02 MED ORDER — PROMETHAZINE-DM 6.25-15 MG/5ML PO SYRP: ORAL_SOLUTION | ORAL | 0 refills | Status: DC

## 2016-04-02 MED ORDER — LEVOFLOXACIN 500 MG PO TABS: 500.0000 mg | ORAL_TABLET | Freq: Every day | ORAL | 0 refills | Status: DC

## 2016-04-02 NOTE — Patient Instructions (Addendum)
Keep appt next week for annual exam  You are treated for chronic bronchitis, and likely cOPD flare  Levaquin, phenergan DM and prednisone are prescribed, also symbicort inhaler use daily  You are referred to Dr Luan Pulling and for lung function testing  NEED TO STOP SMOKING

## 2016-04-02 NOTE — Telephone Encounter (Signed)
Patient aware and labs ordered

## 2016-04-05 DIAGNOSIS — R05 Cough: Secondary | ICD-10-CM | POA: Insufficient documentation

## 2016-04-05 DIAGNOSIS — J441 Chronic obstructive pulmonary disease with (acute) exacerbation: Secondary | ICD-10-CM | POA: Insufficient documentation

## 2016-04-05 DIAGNOSIS — R053 Chronic cough: Secondary | ICD-10-CM | POA: Insufficient documentation

## 2016-04-05 NOTE — Assessment & Plan Note (Signed)
Controlled, no change in medication DASH diet and commitment to daily physical activity for a minimum of 30 minutes discussed and encouraged, as a part of hypertension management. The importance of attaining a healthy weight is also discussed.  BP/Weight 04/02/2016 03/13/2016 11/10/2015 11/08/2015 11/08/2015 Q000111Q 0000000  Systolic BP [id number] 123456 A999333 Q000111Q A999333 [id number] 123456  Diastolic BP 64 68 78 88 84 80 72  Wt. (Lbs) 200 207 213.4 215 215 - 215  BMI 30.41 31.47 32.46 33.67 34.72 - 34.72

## 2016-04-05 NOTE — Assessment & Plan Note (Signed)

## 2016-04-05 NOTE — Assessment & Plan Note (Signed)
Uncontrolled currently due to excess cough, cough suppressant prescribed

## 2016-04-05 NOTE — Assessment & Plan Note (Signed)
1 month h/o cough with intermittent chills, despite antibiotic treatment Pt is immunocompromised and at high risk, levaquin prescribed for 1 week, she is to submit sputum for c/s also

## 2016-04-05 NOTE — Assessment & Plan Note (Signed)
Controlled, no change in medication  

## 2016-04-05 NOTE — Progress Notes (Signed)
   [name]     MRN: [id number]      DOB: 1966-06-07   HPI [name] is here with a 1 month h/o cough , chest congestion, sputum and fatigue , despite antibiotic treatment. She still also continues to smoke Preventive health is updated, specifically  Cancer screening and Immunization.   Questions or concerns regarding consultations or procedures which the PT has had in the interim are  addressed. The PT denies any adverse reactions to current medications since the last visit.  ROS Denies recent fever , reports  chills. Denies sinus pressure, nasal congestion, ear pain or sore throat.  Denies chest pains, palpitations and leg swelling Denies abdominal pain, nausea, vomiting,diarrhea or constipation.   Denies dysuria, frequency, hesitancy or incontinence. Denies uncontrolled joint pain, swelling and limitation in mobility. Denies headaches, seizures, numbness, or tingling. Denies depression, anxiety or insomnia. Denies skin break down or rash.   PE  BP 130/64   Pulse 95   Temp 98.8 F (37.1 C) (Oral)   Resp 18   Ht 5\' 8"  (1.727 m)   Wt 200 lb (90.7 kg)   SpO2 99%   BMI 30.41 kg/m   Patient alert and oriented and in no cardiopulmonary distress.  HEENT: No facial asymmetry, EOMI,   oropharynx pink and moist.  Neck supple no JVD, no mass.  Chest: decreased air entry, scattered wheezes no bronchial breath sounds or crackles.  CVS: S1, S2 no murmurs, no S3.Regular rate.  ABD: Soft non tender.   Ext: No edema  MS: Adequate ROM spine, shoulders, hips and knees.  Skin: Intact, no ulcerations or rash noted.  Psych: Good eye contact, normal affect. Memory intact not anxious or depressed appearing.  CNS: CN 2-12 intact, power,  normal throughout.no focal deficits noted.   Assessment & Plan  COPD exacerbation ([place]) Cough x 1 month in nicotine user. Current CXR and labs show no evidence of pneumonia. Refer for PFT and for pulmonary eval, new dx of COPD is likely  with current exaccerbation  Nicotine dependence Patient counseled for approximately 5 minutes regarding the health risks of ongoing nicotine use, specifically all types of cancer, heart disease, stroke and respiratory failure. The options available for help with cessation ,the behavioral changes to assist the process, and the option to either gradully reduce usage  Or abruptly stop.is also discussed. Pt is also encouraged to set specific goals in number of cigarettes used daily, as well as to set a quit date.     HTN, goal below 130/80 Controlled, no change in medication DASH diet and commitment to daily physical activity for a minimum of 30 minutes discussed and encouraged, as a part of hypertension management. The importance of attaining a healthy weight is also discussed.  BP/Weight 04/02/2016 03/13/2016 11/10/2015 11/08/2015 11/08/2015 Q000111Q 0000000  Systolic BP [id number] 123456 A999333 Q000111Q A999333 [id number] 123456  Diastolic BP 64 68 78 88 84 80 72  Wt. (Lbs) 200 207 213.4 215 215 - 215  BMI 30.41 31.47 32.46 33.67 34.72 - 34.72       Insomnia secondary to depression with anxiety Uncontrolled currently due to excess cough, cough suppressant prescribed   Diabetes mellitus type 2 with complications Controlled, no change in medication   Chronic cough 1 month h/o cough with intermittent chills, despite antibiotic treatment Pt is immunocompromised and at high risk, levaquin prescribed for 1 week, she is to submit sputum for c/s also

## 2016-04-05 NOTE — Assessment & Plan Note (Signed)
Cough x 1 month in nicotine user. Current CXR and labs show no evidence of pneumonia. Refer for PFT and for pulmonary eval, new dx of COPD is likely with current exaccerbation

## 2016-04-09 ENCOUNTER — Encounter (HOSPITAL_COMMUNITY): Payer: Self-pay

## 2016-04-09 ENCOUNTER — Ambulatory Visit (HOSPITAL_COMMUNITY)
Admission: RE | Admit: 2016-04-09 | Discharge: 2016-04-09 | Disposition: A | Discharge status: Home / Self Care | Payer: Medicare Other | Source: Ambulatory Visit | Attending: Family Medicine | Admitting: Family Medicine

## 2016-04-09 DIAGNOSIS — R053 Chronic cough: Secondary | ICD-10-CM

## 2016-04-09 DIAGNOSIS — R05 Cough: Secondary | ICD-10-CM

## 2016-04-09 NOTE — Therapy (Signed)
Attempted to start pulmonary function test with patient, however she had extreme uncontrollable coughing spells.  She thought it would be best for her to wait until she feels better to come back and perform test.  Secretary will reschedule.

## 2016-04-10 ENCOUNTER — Encounter: Payer: Medicare Other | Admitting: Family Medicine

## 2016-04-11 ENCOUNTER — Emergency Department (HOSPITAL_COMMUNITY): Payer: Medicare Other

## 2016-04-11 ENCOUNTER — Encounter (HOSPITAL_COMMUNITY): Payer: Self-pay | Admitting: Emergency Medicine

## 2016-04-11 ENCOUNTER — Emergency Department (HOSPITAL_COMMUNITY)
Admission: EM | Admit: 2016-04-11 | Discharge: 2016-04-11 | Disposition: A | Discharge status: Home / Self Care | Payer: Medicare Other | Attending: Emergency Medicine | Admitting: Emergency Medicine

## 2016-04-11 DIAGNOSIS — R51 Headache: Secondary | ICD-10-CM | POA: Diagnosis not present

## 2016-04-11 DIAGNOSIS — R509 Fever, unspecified: Secondary | ICD-10-CM | POA: Diagnosis not present

## 2016-04-11 DIAGNOSIS — I2510 Atherosclerotic heart disease of native coronary artery without angina pectoris: Secondary | ICD-10-CM | POA: Diagnosis not present

## 2016-04-11 DIAGNOSIS — I5020 Unspecified systolic (congestive) heart failure: Secondary | ICD-10-CM | POA: Insufficient documentation

## 2016-04-11 DIAGNOSIS — E119 Type 2 diabetes mellitus without complications: Secondary | ICD-10-CM | POA: Diagnosis not present

## 2016-04-11 DIAGNOSIS — J441 Chronic obstructive pulmonary disease with (acute) exacerbation: Secondary | ICD-10-CM | POA: Insufficient documentation

## 2016-04-11 DIAGNOSIS — Z79899 Other long term (current) drug therapy: Secondary | ICD-10-CM | POA: Insufficient documentation

## 2016-04-11 DIAGNOSIS — R05 Cough: Secondary | ICD-10-CM | POA: Diagnosis not present

## 2016-04-11 DIAGNOSIS — R059 Cough, unspecified: Secondary | ICD-10-CM

## 2016-04-11 DIAGNOSIS — M549 Dorsalgia, unspecified: Secondary | ICD-10-CM | POA: Diagnosis present

## 2016-04-11 DIAGNOSIS — F17210 Nicotine dependence, cigarettes, uncomplicated: Secondary | ICD-10-CM | POA: Diagnosis not present

## 2016-04-11 DIAGNOSIS — Z7982 Long term (current) use of aspirin: Secondary | ICD-10-CM | POA: Insufficient documentation

## 2016-04-11 DIAGNOSIS — I110 Hypertensive heart disease with heart failure: Secondary | ICD-10-CM | POA: Insufficient documentation

## 2016-04-11 MED ORDER — DEXAMETHASONE SODIUM PHOSPHATE 10 MG/ML IJ SOLN
10.0000 mg | Freq: Once | INTRAMUSCULAR | Status: AC
  Administered 2016-04-11: 10 mg via INTRAMUSCULAR
  Filled 2016-04-11: qty 1

## 2016-04-11 MED ORDER — DOXYCYCLINE HYCLATE 100 MG PO CAPS
100.0000 mg | ORAL_CAPSULE | Freq: Two times a day (BID) | ORAL | 0 refills | Status: DC
Start: 2016-04-11 — End: 2016-07-31

## 2016-04-11 MED ORDER — PREDNISONE 20 MG PO TABS: ORAL_TABLET | ORAL | 0 refills | Status: DC

## 2016-04-11 MED ORDER — HYDROCODONE-ACETAMINOPHEN 5-325 MG PO TABS
2.0000 | ORAL_TABLET | Freq: Once | ORAL | Status: AC
  Administered 2016-04-11: 2 via ORAL
  Filled 2016-04-11: qty 2

## 2016-04-11 NOTE — ED Notes (Signed)
MD at the bedside with US

## 2016-04-11 NOTE — ED Provider Notes (Signed)
[facility] DEPT Provider Note   CSN: [id number] Arrival date & time: 04/11/16  2027  By signing my name below, I, Hansel Feinstein, attest that this documentation has been prepared under the direction and in the presence of Elnora Morrison, MD. Electronically Signed: Hansel Feinstein, ED Scribe. 04/11/16. 9:14 PM.    History   Chief Complaint Chief Complaint  Patient presents with  . Generalized Body Aches    HPI MORAYO JUNIUS is a 50 y.o. female with h/o Lupus, pacemaker placement, CHF, 8 MI, 4 cardiac stents who presents to the Emergency Department complaining of moderate, aching generalized back pain onset yesterday after sneezing 10 times.She also notes that yesterday, her back pain began to become worsened with breathing. She also complains of ongoing fever, chills, cough, generalized body aches and HA for 4 weeks. Pt states her cough was initially productive, but is now dry. She reports that she was given a course of Penicillin, Steroids and a cough suppressant by her PCP and reports that it provided her no relief of her symptoms. She denies any recent changes in medications and reports that she is compliant with her Lupus medications. She also takes multiple daily anticoagulants. She notes h/o fluid build up in the lungs secondary to Lupus. Pt states her current symptoms do not feel similar to prior MIs. No h/o PE. Pt sees her pulmonologist every 6 months. She denies additional complaints.   The history is provided by the patient. No language interpreter was used.    Past Medical History:  Diagnosis Date  . Arthritis of knee   . Automatic implantable cardiac defibrillator in situ    a. s/p prior Medtronic ICD with 6949 lead. b. s/p Gen change & lead revision 05/2013.  . Cardiomyopathy secondary    Patient has cardiomyopathy out of proportion to her ischemic heart disease  . coronary artery disease    RCA stenting Myoview 2011 EF 30% infarction dilatation without ischemia  .  Depression   . Diabetes mellitus   . GERD (gastroesophageal reflux disease)   . Insomnia   . Lupus (systemic lupus erythematosus) ([place])   . Migraines   . Myocardial infarction ([place])    8 total   . Nicotine abuse   . Other and unspecified hyperlipidemia   . Paroxysmal VT ([place])   . Systolic CHF ([place])   . VF (ventricular fibrillation) ([place])    a. Hx appropriate ICD therapy for VF.    Patient Active Problem List   Diagnosis Date Noted  . COPD exacerbation ([place]) 04/05/2016  . Chronic cough 04/05/2016  . Vitamin D deficiency 07/26/2015  . Depression with anxiety 03/11/2015  . Metabolic syndrome X [id number]  . Adhesive capsulitis of left shoulder 09/26/2013  . Ventricular tachycardia (paroxysmal) ([place]) 12/17/2012  . Nicotine dependence 11/07/2012  . HSV-2 seropositive 11/05/2012  . H/O abnormal Pap smear 11/02/2012  . Fatty liver 08/10/2012  . HTN, goal below 130/80 09/13/2011  . Ischemic cardiomyopathy 05/27/2011  . Automatic implantable cardiac defibrillator--Medtronic 05/27/2011  . 6949 defibrillator lead 05/27/2011  . FATIGUE 06/05/2009  . VENTRICULAR FIBRILLATION 02/04/2009  . Morbid obesity ([place]) 12/20/2008  . Diabetes mellitus type 2 with complications ([place]) [id number]  . Hyperlipidemia LDL goal <100 08/31/2007  . ANXIETY 08/31/2007  . BRONCHITIS, ACUTE 08/31/2007  . SLE 08/31/2007  . ARTHRITIS, KNEES, BILATERAL 08/31/2007  . AVASCULAR NECROSIS 08/31/2007  . Insomnia secondary to depression with anxiety 08/31/2007  . MIGRAINES, HX OF 08/31/2007    Past Surgical History:  Procedure  Laterality Date  . ABDOMINAL HYSTERECTOMY    . CHOLECYSTECTOMY    . COLONOSCOPY  07/15/2011   SLF: internal hemorrhoids/hyperplastic polyps in the rectum/tubular adenomaSURVEILLANCE Nov 2017  . defibrillator placed   2009 and 05/2013  . ICD GENERATOR CHANGE  06/17/2013   Dr Caryl Comes  . IMPLANTABLE CARDIOVERTER DEFIBRILLATOR (ICD) GENERATOR CHANGE Left 06/17/2013   Procedure: ICD  GENERATOR CHANGE;  Surgeon: Deboraha Sprang, MD;  Location: [facility] CATH LAB;  Service: Cardiovascular;  Laterality: Left;  . LEAD REVISION N/A 06/17/2013   Procedure: LEAD REVISION;  Surgeon: Deboraha Sprang, MD;  Location: [facility] CATH LAB;  Service: Cardiovascular;  Laterality: N/A;  . ROTATOR CUFF REPAIR Right 2002    OB History    No data available       Home Medications    Prior to Admission medications   Medication Sig Start Date End Date Taking? Authorizing Provider  albuterol (PROVENTIL HFA;VENTOLIN HFA) 108 (90 Base) MCG/ACT inhaler Inhale 2 puffs into the lungs every 6 (six) hours as needed for wheezing or shortness of breath. 03/13/16  Yes Fayrene Helper, MD  ALPRAZolam Duanne Moron) 1 MG tablet TAKE 1 TABLET BY MOUTH AT BEDTIME AS NEEDED FOR ANXIETY. 03/13/16  Yes Fayrene Helper, MD  aspirin 81 MG tablet Take 1 tablet (81 mg total) by mouth daily. 09/26/13  Yes Deboraha Sprang, MD  Aspirin-Acetaminophen (GOODYS BODY PAIN PO) Take 1 Package by mouth daily as needed. For pain   Yes Historical Provider, MD  atorvastatin (LIPITOR) 10 MG tablet Take 1 tablet (10 mg total) by mouth daily. 03/18/16  Yes Fayrene Helper, MD  budesonide-formoterol [facility]) 80-4.5 MCG/ACT inhaler Inhale 2 puffs into the lungs 2 (two) times daily. 04/02/16  Yes Fayrene Helper, MD  carvedilol (COREG) 3.125 MG tablet TAKE (1) TABLET BY MOUTH TWICE DAILY. Patient taking differently: Take 3.125 mg by mouth 2 (two) times daily with a meal. TAKE (1) TABLET BY MOUTH TWICE DAILY. 01/18/16  Yes Deboraha Sprang, MD  clopidogrel (PLAVIX) 75 MG tablet Take 1 tablet (75 mg total) by mouth daily. 01/18/16  Yes Deboraha Sprang, MD  cyclobenzaprine (FLEXERIL) 5 MG tablet Take 1 tablet (5 mg total) by mouth 3 (three) times daily as needed for muscle spasms. 11/08/15  Yes Evalee Jefferson, PA-C  ergocalciferol (VITAMIN D2) 50000 units capsule Take 1 capsule (50,000 Units total) by mouth once a week. One capsule once weekly 01/17/16  Yes Fayrene Helper, MD  fenofibrate (TRICOR) 145 MG tablet Take 145 mg by mouth daily.  04/15/13  Yes Historical Provider, MD  oxyCODONE (ROXICODONE) 15 MG immediate release tablet Take 30 mg by mouth 3 (three) times daily. 04/10/16  Yes Historical Provider, MD  predniSONE (STERAPRED UNI-PAK 21 TAB) 5 MG (21) TBPK tablet Take 1 tablet (5 mg total) by mouth as directed. Use as directed 04/02/16  Yes Fayrene Helper, MD  promethazine-dextromethorphan (PROMETHAZINE-DM) 6.25-15 MG/5ML syrup One teaspoon twice daily as  Needed, for excess cough 04/02/16  Yes Fayrene Helper, MD  doxycycline (VIBRAMYCIN) 100 MG capsule Take 1 capsule (100 mg total) by mouth 2 (two) times daily. One po bid x 7 days 04/11/16   Elnora Morrison, MD  levofloxacin (LEVAQUIN) 500 MG tablet Take 1 tablet (500 mg total) by mouth daily. 04/02/16   Fayrene Helper, MD  predniSONE (DELTASONE) 20 MG tablet 3 tabs po day one, then 2 po daily x 4 days 04/11/16   Elnora Morrison, MD  Family History Family History  Problem Relation Age of Onset  . Hepatitis Mother 56    HCV  . Liver cancer Mother 75  . Cancer Mother   . Diabetes Mother   . Heart disease Mother   . Hyperlipidemia Mother   . Stroke Sister 45    x3  . Diabetes Sister   . Hyperlipidemia Sister   . Dementia Father   . HIV Sister   . Diabetes Brother   . Heart disease Brother   . Hyperlipidemia Brother     Social History Social History  Substance Use Topics  . Smoking status: Current Every Day Smoker    Packs/day: 0.75    Years: 7.00    Types: Cigarettes  . Smokeless tobacco: Never Used  . Alcohol use 0.0 oz/week     Comment: occasionaly     Allergies   Bee venom; Cortisone; and Robaxin [methocarbamol]   Review of Systems Review of Systems  Constitutional: Positive for chills and fever.  HENT: Positive for sneezing.   Respiratory: Positive for cough.   Cardiovascular: Negative for chest pain and leg swelling.  Musculoskeletal: Positive for back pain  and myalgias (generlized).  Neurological: Positive for headaches.  All other systems reviewed and are negative.   Physical Exam Updated Vital Signs BP 119/83 (BP Location: Left Arm)   Pulse 80   Temp 98.5 F (36.9 C)   Resp 17   Ht 5\' 5"  (1.651 m)   Wt 200 lb (90.7 kg)   SpO2 96%   BMI 33.28 kg/m   Physical Exam  Constitutional: She appears well-developed and well-nourished. No distress.  HENT:  Head: Normocephalic.  Eyes: Conjunctivae are normal.  Cardiovascular: Normal rate, regular rhythm and normal heart sounds.  Exam reveals no friction rub.   No murmur heard. Pulmonary/Chest: Effort normal and breath sounds normal. No respiratory distress. She has no wheezes. She has no rales.  Lungs CTA bilaterally.   Abdominal: She exhibits no distension.  Musculoskeletal: Normal range of motion.  Neurological: She is alert.  Skin: Skin is warm and dry.  Psychiatric: She has a normal mood and affect. Her behavior is normal.  Nursing note and vitals reviewed.    ED Treatments / Results  Labs (all labs ordered are listed, but only abnormal results are displayed) Labs Reviewed - No data to display  EKG  EKG Interpretation None       Radiology Dg Chest 2 View  Result Date: 04/11/2016 CLINICAL DATA:  Nonproductive cough for 5 weeks. Back and abdominal pain. EXAM: CHEST  2 VIEW COMPARISON:  Radiographs 04/02/2016, and 10/11/2015 FINDINGS: Dual lead left-sided pacemaker in place. Mild cardiomegaly is stable. Mild interstitial prominence is unchanged from recent prior. No focal airspace opacity, pleural effusion, or pneumothorax. Unchanged osseous structures. IMPRESSION: Interstitial prominence that may be bronchitic or congestive, unchanged from recent prior. Cardiomegaly is stable. No new abnormality is seen. Electronically Signed   By: Jeb Levering M.D.   On: 04/11/2016 22:09    Procedures Procedures (including critical care time)  DIAGNOSTIC STUDIES: Oxygen Saturation  is 100% on RA, normal by my interpretation.    COORDINATION OF CARE: 9:02 PM Discussed treatment plan with pt at bedside which includes bedside US, CXR and pt agreed to plan.     EMERGENCY DEPARTMENT [place] CARDIAC EXAM "Study: Limited Ultrasound of the heart and pericardium"  INDICATIONS:Dyspnea Multiple views of the heart and pericardium were obtained in real-time with a multi-frequency probe.  PERFORMED [id number]  IMAGES ARCHIVED?:  Yes  FINDINGS: Decreased ejection fraction. No significant pericardial effusion   LIMITATIONS:  Body habitus; probe type  VIEWS USED: Parasternal long axis and Parasternal short axis  INTERPRETATION: Decreased ejection fraction. No significant pericardial effusion       Medications Ordered in ED Medications  dexamethasone (DECADRON) injection 10 mg (10 mg Intramuscular Given 04/11/16 2201)  HYDROcodone-acetaminophen (NORCO/VICODIN) 5-325 MG per tablet 2 tablet (2 tablets Oral Given 04/11/16 2201)     Initial Impression / Assessment and Plan / ED Course  I have reviewed the triage vital signs and the nursing notes.  Pertinent labs & imaging results that were available during my care of the patient were reviewed by me and considered in my medical decision making (see chart for details).  Clinical Course    Patient presents with worsening cough sneezing bilateral rib tenderness with coughing. Vitals unremarkable. Lungs clear. Concern for lupus related inflammation. Plan for Decadron, bedside ultrasound did not reveal significant pericardial effusion. Chest x-ray pending. Discussed follow-up with her specialist closely early this week.  Pt improved in ED, CXR unremarkable.   Results and differential diagnosis were discussed with the patient/parent/guardian. Xrays were independently reviewed by myself.  Close follow up outpatient was discussed, comfortable with the plan.   Medications  dexamethasone (DECADRON) injection 10 mg (10 mg Intramuscular  Given 04/11/16 2201)  HYDROcodone-acetaminophen (NORCO/VICODIN) 5-325 MG per tablet 2 tablet (2 tablets Oral Given 04/11/16 2201)    Vitals:   04/11/16 2033 04/11/16 2034 04/11/16 2313  BP: 131/76  119/83  Pulse: (!) 59  80  Resp: 20  17  Temp: 98.5 F (36.9 C)    SpO2: 100%  96%  Weight:  200 lb (90.7 kg)   Height:  5\' 5"  (1.651 m)     Final diagnoses:  Cough     Final Clinical Impressions(s) / ED Diagnoses   Final diagnoses:  Cough    New Prescriptions New Prescriptions   DOXYCYCLINE (VIBRAMYCIN) 100 MG CAPSULE    Take 1 capsule (100 mg total) by mouth 2 (two) times daily. One po bid x 7 days   PREDNISONE (DELTASONE) 20 MG TABLET    3 tabs po day one, then 2 po daily x 4 days      Elnora Morrison, MD 04/11/16 2324

## 2016-04-11 NOTE — Discharge Instructions (Signed)
Follow up closely with your specialists.  If you were given medicines take as directed.  If you are on coumadin or contraceptives realize their levels and effectiveness is altered by many different medicines.  If you have any reaction (rash, tongues swelling, other) to the medicines stop taking and see a physician.    If your blood pressure was elevated in the ER make sure you follow up for management with a primary doctor or return for chest pain, shortness of breath or stroke symptoms.  Please follow up as directed and return to the ER or see a physician for new or worsening symptoms.  Thank you. Vitals:   04/11/16 2033 04/11/16 2034 04/11/16 2313  BP: 131/76  119/83  Pulse: (!) 59  80  Resp: 20  17  Temp: 98.5 F (36.9 C)    SpO2: 100%  96%  Weight:  200 lb (90.7 kg)   Height:  5\' 5"  (1.651 m)

## 2016-04-11 NOTE — ED Notes (Signed)
Patient ambulatory to restroom with steady gait, with family

## 2016-04-11 NOTE — ED Triage Notes (Signed)
Pt c/o back pain/abd pain since sneezing yesterday. Pt c/o cold symptoms x 5weeks.

## 2016-04-11 NOTE — ED Notes (Signed)
Pt back from x-ray.

## 2016-04-12 ENCOUNTER — Telehealth: Payer: Self-pay | Admitting: Family Medicine

## 2016-04-12 NOTE — Telephone Encounter (Signed)
Pls call pt and let her know that I reviewed  her recent ED visit. She needs to see her  Doc at [facility] takes care of her lupus, as soon as possible per recommendations from Ed Doc as well as myself, as she has not improved in past 1 month, I will enter referral if needed, she is already an established pt there, but an urgent referral will likely help the process as well as you may be able to get the date for her. Pls send me back a msg after you spk with her re the name of the Doc I will enter urgent referal, she has been ill for approx 4 weeks so need to follow through Thanks  ?? pls ask!

## 2016-04-14 NOTE — Telephone Encounter (Signed)
I have spoken to [name], she states that she sees Dr. Cherene Julian at [facility], she states that  She has a f/u appt scheduled for Oct but she is calling them today to see if she can come in asap and she will contact our office to make [place] aware, I told her we would give her a referral if needed to please let us know.

## 2016-04-14 NOTE — Telephone Encounter (Signed)
thankls

## 2016-04-15 DIAGNOSIS — M3214 Glomerular disease in systemic lupus erythematosus: Secondary | ICD-10-CM | POA: Diagnosis not present

## 2016-04-16 ENCOUNTER — Telehealth: Payer: Self-pay | Admitting: Family Medicine

## 2016-04-16 NOTE — Telephone Encounter (Signed)
Someone called her and asked for her lupus results - lupus has not flared back up but her white blood cells are down.  She is on an antibiotic right now and has a yeast infection - can you please send her something in to clear the infection up.

## 2016-04-17 MED ORDER — FLUCONAZOLE 150 MG PO TABS: 150.0000 mg | ORAL_TABLET | Freq: Once | ORAL | 0 refills | Status: AC

## 2016-04-17 NOTE — Telephone Encounter (Signed)
Medication sent to pharmacy.  Called and left message for patient notifying.

## 2016-05-01 DIAGNOSIS — R05 Cough: Secondary | ICD-10-CM | POA: Diagnosis not present

## 2016-05-01 DIAGNOSIS — L930 Discoid lupus erythematosus: Secondary | ICD-10-CM | POA: Diagnosis not present

## 2016-05-01 DIAGNOSIS — I10 Essential (primary) hypertension: Secondary | ICD-10-CM | POA: Diagnosis not present

## 2016-05-21 DIAGNOSIS — M329 Systemic lupus erythematosus, unspecified: Secondary | ICD-10-CM | POA: Diagnosis not present

## 2016-06-02 ENCOUNTER — Ambulatory Visit (INDEPENDENT_AMBULATORY_CARE_PROVIDER_SITE_OTHER): Payer: Medicare Other | Admitting: *Deleted

## 2016-06-02 DIAGNOSIS — I255 Ischemic cardiomyopathy: Secondary | ICD-10-CM | POA: Diagnosis not present

## 2016-06-02 NOTE — Progress Notes (Signed)
Remote ICD transmission.   

## 2016-06-04 ENCOUNTER — Encounter: Payer: Self-pay | Admitting: Cardiology

## 2016-06-13 LAB — CUP PACEART REMOTE DEVICE CHECK
Battery Remaining Longevity: 119 mo
Date Time Interrogation Session: 20171016083826
HIGH POWER IMPEDANCE MEASURED VALUE: 78 Ohm
Lead Channel Impedance Value: 418 Ohm
Lead Channel Impedance Value: 475 Ohm
Lead Channel Setting Pacing Pulse Width: 0.4 ms
Lead Channel Setting Sensing Sensitivity: 0.3 mV
MDC IDC LEAD IMPLANT DT: 20141031
MDC IDC LEAD LOCATION: 753860
MDC IDC LEAD MODEL: 6935
MDC IDC MSMT BATTERY VOLTAGE: 3.01 V
MDC IDC MSMT LEADCHNL RV PACING THRESHOLD AMPLITUDE: 0.625 V
MDC IDC MSMT LEADCHNL RV PACING THRESHOLD PULSEWIDTH: 0.4 ms
MDC IDC MSMT LEADCHNL RV SENSING INTR AMPL: 8.875 mV
MDC IDC MSMT LEADCHNL RV SENSING INTR AMPL: 8.875 mV
MDC IDC SET LEADCHNL RV PACING AMPLITUDE: 2.5 V
MDC IDC STAT BRADY RV PERCENT PACED: 0.01 %

## 2016-06-20 ENCOUNTER — Encounter: Payer: Self-pay | Admitting: Cardiology

## 2016-06-23 ENCOUNTER — Encounter: Payer: Self-pay | Admitting: Gastroenterology

## 2016-07-17 ENCOUNTER — Encounter: Payer: Medicare Other | Admitting: Family Medicine

## 2016-07-28 ENCOUNTER — Other Ambulatory Visit: Payer: Self-pay | Admitting: Family Medicine

## 2016-07-28 ENCOUNTER — Emergency Department (HOSPITAL_COMMUNITY)
Admission: EM | Admit: 2016-07-28 | Discharge: 2016-07-28 | Discharge status: Against Medical Advice | Payer: Medicare Other

## 2016-07-31 ENCOUNTER — Encounter: Payer: Self-pay | Admitting: Family Medicine

## 2016-07-31 ENCOUNTER — Ambulatory Visit (INDEPENDENT_AMBULATORY_CARE_PROVIDER_SITE_OTHER): Payer: Medicare Other | Admitting: Family Medicine

## 2016-07-31 ENCOUNTER — Ambulatory Visit (HOSPITAL_COMMUNITY)
Admission: RE | Admit: 2016-07-31 | Discharge: 2016-07-31 | Disposition: A | Discharge status: Home / Self Care | Payer: Medicare Other | Source: Ambulatory Visit | Attending: Family Medicine | Admitting: Family Medicine

## 2016-07-31 ENCOUNTER — Other Ambulatory Visit (HOSPITAL_COMMUNITY)
Admission: RE | Admit: 2016-07-31 | Discharge: 2016-07-31 | Disposition: A | Discharge status: Home / Self Care | Payer: Medicare Other | Attending: *Deleted | Admitting: *Deleted

## 2016-07-31 VITALS — BP 118/80 | HR 88 | Resp 16 | Ht 65.0 in | Wt 214.0 lb

## 2016-07-31 DIAGNOSIS — R1030 Lower abdominal pain, unspecified: Secondary | ICD-10-CM | POA: Diagnosis not present

## 2016-07-31 DIAGNOSIS — I700 Atherosclerosis of aorta: Secondary | ICD-10-CM | POA: Insufficient documentation

## 2016-07-31 DIAGNOSIS — K5730 Diverticulosis of large intestine without perforation or abscess without bleeding: Secondary | ICD-10-CM | POA: Insufficient documentation

## 2016-07-31 DIAGNOSIS — N3001 Acute cystitis with hematuria: Secondary | ICD-10-CM | POA: Diagnosis not present

## 2016-07-31 DIAGNOSIS — Z1211 Encounter for screening for malignant neoplasm of colon: Secondary | ICD-10-CM

## 2016-07-31 DIAGNOSIS — R102 Pelvic and perineal pain: Secondary | ICD-10-CM

## 2016-07-31 DIAGNOSIS — I10 Essential (primary) hypertension: Secondary | ICD-10-CM

## 2016-07-31 DIAGNOSIS — K760 Fatty (change of) liver, not elsewhere classified: Secondary | ICD-10-CM | POA: Insufficient documentation

## 2016-07-31 LAB — POCT URINALYSIS DIPSTICK
BILIRUBIN UA: NEGATIVE
Glucose, UA: NEGATIVE
KETONES UA: NEGATIVE
Nitrite, UA: NEGATIVE
Protein, UA: NEGATIVE
Spec Grav, UA: 1.025
Urobilinogen, UA: 1
pH, UA: 5.5

## 2016-07-31 LAB — POC HEMOCCULT BLD/STL (OFFICE/1-CARD/DIAGNOSTIC): Fecal Occult Blood, POC: NEGATIVE

## 2016-07-31 NOTE — Progress Notes (Signed)
   [name]     MRN: [id number]      DOB: 1966-01-10   HPI [name] 4 day h/o bilateral pelvic pain, , initially constant rated at a 7, experincing pressure with urination, no dysuria , fequency or hematuria, no fever , chill or flank pain  ROS Denies recent fever or chills. Denies sinus pressure, nasal congestion, ear pain or sore throat. Denies chest congestion, productive cough or wheezing. Denies chest pains, palpitations and leg swelling Denies , nausea, vomiting,diarrhea or constipation.   Denies dysuria, frequency, hesitancy or incontinence. Denies uncontrolled  joint pain, swelling and limitation in mobility. Denies headaches, seizures, numbness, or tingling. Denies uncontrolled depression, anxiety or insomnia. Denies skin break down or rash.   PE  BP 118/80   Pulse 88   Resp 16   Ht 5\' 5"  (1.651 m)   Wt 214 lb (97.1 kg)   SpO2 96%   BMI 35.61 kg/m   Patient alert and oriented and in no cardiopulmonary distress.  HEENT: No facial asymmetry, EOMI,   oropharynx pink and moist.  Neck supple no JVD, no mass.  Chest: Clear to auscultation bilaterally.  CVS: S1, S2 no murmurs, no S3.Regular rate.  ABD: Soft , lower abdominal tenderness, no guarding or rebound Rectal: no mass, heme negative stool  Ext: No edema  MS: Adequate though reduced  ROM spine, shoulders, hips and knees.  Skin: Intact, no ulcerations or rash noted.  Psych: Good eye contact, normal affect. Memory intact not anxious or depressed appearing.  CNS: CN 2-12 intact, power,  normal throughout.no focal deficits noted.   Assessment & Plan  Lower abdominal pain 4 day h/o disabling lower abdominal pain, needs CT scan asap  Pelvic pain in female 4 day h/o disabling, needs CT scan asap to evaluate  HTN, goal below 130/80 Controlled, no change in medication DASH diet and commitment to daily physical activity for a minimum of 30 minutes discussed and encouraged, as a part of  hypertension management. The importance of attaining a healthy weight is also discussed.  BP/Weight 07/31/2016 04/11/2016 04/02/2016 03/13/2016 11/10/2015 11/08/2015 [id number]  Systolic BP 123456 123456 [id number] 123456 [id number]  Diastolic BP 80 83 64 68 78 88 84  Wt. (Lbs) 214 200 200 207 213.4 215 215  BMI 35.61 33.28 30.41 31.47 32.46 33.67 34.72

## 2016-07-31 NOTE — Patient Instructions (Signed)
F/u as before, call if you need me sooner  You are referred for scan of abdomen and pelvis, if normal I will refer you to stomach specialist  Urine is being tested today

## 2016-08-02 ENCOUNTER — Encounter: Payer: Self-pay | Admitting: Family Medicine

## 2016-08-02 DIAGNOSIS — R102 Pelvic and perineal pain: Secondary | ICD-10-CM | POA: Insufficient documentation

## 2016-08-02 LAB — URINE CULTURE

## 2016-08-02 NOTE — Assessment & Plan Note (Signed)
4 day h/o disabling, needs CT scan asap to evaluate

## 2016-08-02 NOTE — Assessment & Plan Note (Signed)
Controlled, no change in medication DASH diet and commitment to daily physical activity for a minimum of 30 minutes discussed and encouraged, as a part of hypertension management. The importance of attaining a healthy weight is also discussed.  BP/Weight 07/31/2016 04/11/2016 04/02/2016 03/13/2016 11/10/2015 11/08/2015 [id number]  Systolic BP 123456 123456 [id number] 123456 [id number]  Diastolic BP 80 83 64 68 78 88 84  Wt. (Lbs) 214 200 200 207 213.4 215 215  BMI 35.61 33.28 30.41 31.47 32.46 33.67 34.72

## 2016-08-02 NOTE — Assessment & Plan Note (Signed)
4 day h/o disabling lower abdominal pain, needs CT scan asap

## 2016-08-03 ENCOUNTER — Encounter: Payer: Self-pay | Admitting: Family Medicine

## 2016-09-02 ENCOUNTER — Encounter: Payer: Medicare Other | Admitting: Family Medicine

## 2016-09-02 DIAGNOSIS — M329 Systemic lupus erythematosus, unspecified: Secondary | ICD-10-CM | POA: Diagnosis not present

## 2016-09-02 DIAGNOSIS — I252 Old myocardial infarction: Secondary | ICD-10-CM | POA: Diagnosis not present

## 2016-09-02 DIAGNOSIS — H40033 Anatomical narrow angle, bilateral: Secondary | ICD-10-CM | POA: Diagnosis not present

## 2016-09-02 DIAGNOSIS — E1136 Type 2 diabetes mellitus with diabetic cataract: Secondary | ICD-10-CM | POA: Diagnosis not present

## 2016-09-02 DIAGNOSIS — H40003 Preglaucoma, unspecified, bilateral: Secondary | ICD-10-CM | POA: Diagnosis not present

## 2016-09-02 DIAGNOSIS — H2513 Age-related nuclear cataract, bilateral: Secondary | ICD-10-CM | POA: Diagnosis not present

## 2016-09-02 DIAGNOSIS — Z888 Allergy status to other drugs, medicaments and biological substances status: Secondary | ICD-10-CM | POA: Diagnosis not present

## 2016-09-02 DIAGNOSIS — H53411 Scotoma involving central area, right eye: Secondary | ICD-10-CM | POA: Diagnosis not present

## 2016-09-02 DIAGNOSIS — Z79899 Other long term (current) drug therapy: Secondary | ICD-10-CM | POA: Diagnosis not present

## 2016-09-08 ENCOUNTER — Ambulatory Visit (INDEPENDENT_AMBULATORY_CARE_PROVIDER_SITE_OTHER): Payer: Medicare Other

## 2016-09-08 VITALS — BP 110/72 | HR 87 | Temp 98.6°F | Ht 66.0 in | Wt 216.0 lb

## 2016-09-08 DIAGNOSIS — E118 Type 2 diabetes mellitus with unspecified complications: Secondary | ICD-10-CM | POA: Diagnosis not present

## 2016-09-08 DIAGNOSIS — E559 Vitamin D deficiency, unspecified: Secondary | ICD-10-CM

## 2016-09-08 DIAGNOSIS — I10 Essential (primary) hypertension: Secondary | ICD-10-CM | POA: Diagnosis not present

## 2016-09-08 DIAGNOSIS — Z0000 Encounter for general adult medical examination without abnormal findings: Secondary | ICD-10-CM

## 2016-09-08 DIAGNOSIS — Z1239 Encounter for other screening for malignant neoplasm of breast: Secondary | ICD-10-CM

## 2016-09-08 DIAGNOSIS — Z1231 Encounter for screening mammogram for malignant neoplasm of breast: Secondary | ICD-10-CM

## 2016-09-08 NOTE — Patient Instructions (Addendum)
Health maintenance: Due for Flu vaccine, patient prefers to wait until her visit in February with Dr. Moshe Cipro. Mammogram due this month, this has been ordered today. Please call the Breast center and schedule this appointment.    Abnormal screenings: None   Patient concerns: None   Nurse concerns: Weight, medications running out too soon. Resource referral sent today for medication assistance for a pill binder.   Next PCP appt: 09/24/2016 at 1:00pm with Dr. Moshe Cipro. Labs ordered today, please have these completed prior to your appointment with Dr. Moshe Cipro.  Steps to Quit Smoking Smoking tobacco can be bad for your health. It can also affect almost every organ in your body. Smoking puts you and people around you at risk for many serious long-lasting (chronic) diseases. Quitting smoking is hard, but it is one of the best things that you can do for your health. It is never too late to quit. What are the benefits of quitting smoking? When you quit smoking, you lower your risk for getting serious diseases and conditions. They can include:  Lung cancer or lung disease.  Heart disease.  Stroke.  Heart attack.  Not being able to have children (infertility).  Weak bones (osteoporosis) and broken bones (fractures). If you have coughing, wheezing, and shortness of breath, those symptoms may get better when you quit. You may also get sick less often. If you are pregnant, quitting smoking can help to lower your chances of having a baby of low birth weight. What can I do to help me quit smoking? Talk with your doctor about what can help you quit smoking. Some things you can do (strategies) include:  Quitting smoking totally, instead of slowly cutting back how much you smoke over a period of time.  Going to in-person counseling. You are more likely to quit if you go to many counseling sessions.  Using resources and support systems, such as:  Online chats with a Social worker.  Phone  quitlines.  Printed Furniture conservator/restorer.  Support groups or group counseling.  Text messaging programs.  Mobile phone apps or applications.  Taking medicines. Some of these medicines may have nicotine in them. If you are pregnant or breastfeeding, do not take any medicines to quit smoking unless your doctor says it is okay. Talk with your doctor about counseling or other things that can help you. Talk with your doctor about using more than one strategy at the same time, such as taking medicines while you are also going to in-person counseling. This can help make quitting easier. What things can I do to make it easier to quit? Quitting smoking might feel very hard at first, but there is a lot that you can do to make it easier. Take these steps:  Talk to your family and friends. Ask them to support and encourage you.  Call phone quitlines, reach out to support groups, or work with a Social worker.  Ask people who smoke to not smoke around you.  Avoid places that make you want (trigger) to smoke, such as:  Bars.  Parties.  Smoke-break areas at work.  Spend time with people who do not smoke.  Lower the stress in your life. Stress can make you want to smoke. Try these things to help your stress:  Getting regular exercise.  Deep-breathing exercises.  Yoga.  Meditating.  Doing a body scan. To do this, close your eyes, focus on one area of your body at a time from head to toe, and notice which parts of your  body are tense. Try to relax the muscles in those areas.  Download or buy apps on your mobile phone or tablet that can help you stick to your quit plan. There are many free apps, such as QuitGuide from the [employer] Office manager for Disease Control and Prevention). You can find more support from smokefree.gov and other websites. This information is not intended to replace advice given to you by your health care provider. Make sure you discuss any questions you have with your health care  provider. Document Released: 05/31/2009 Document Revised: 04/01/2016 Document Reviewed: 12/19/2014 Elsevier Interactive Patient Education  2017 [employer].

## 2016-09-08 NOTE — Progress Notes (Signed)
Subjective:   [name] is a 51 y.o. female who presents for Medicare Annual (Subsequent) preventive examination.  Review of Systems: Cardiac Risk Factors include: diabetes mellitus;dyslipidemia;family history of premature cardiovascular disease;hypertension;obesity (BMI >30kg/m2);sedentary lifestyle;smoking/ tobacco exposure     Objective:     Vitals: BP 110/72   Pulse 87   Temp 98.6 F (37 C) (Oral)   Ht 5\' 6"  (1.676 m)   Wt 216 lb 0.6 oz (98 kg)   SpO2 91%   BMI 34.87 kg/m   Body mass index is 34.87 kg/m.   Tobacco History  Smoking Status  . Current Every Day Smoker  . Packs/day: 0.50  . Years: 15.00  . Types: Cigarettes  Smokeless Tobacco  . Never Used     Ready to quit: No Counseling given: Yes   Past Medical History:  Diagnosis Date  . Arthritis of knee   . Automatic implantable cardiac defibrillator in situ    a. s/p prior Medtronic ICD with 6949 lead. b. s/p Gen change & lead revision 05/2013.  . Cardiomyopathy secondary    Patient has cardiomyopathy out of proportion to her ischemic heart disease  . coronary artery disease    RCA stenting Myoview 2011 EF 30% infarction dilatation without ischemia  . Depression   . Diabetes mellitus   . GERD (gastroesophageal reflux disease)   . Insomnia   . Lupus (systemic lupus erythematosus) ([place])   . Migraines   . Myocardial infarction    8 total   . Nicotine abuse   . Other and unspecified hyperlipidemia   . Paroxysmal VT ([place])   . Systolic CHF ([address])   . VF (ventricular fibrillation) ([place])    a. Hx appropriate ICD therapy for VF.   Past Surgical History:  Procedure Laterality Date  . ABDOMINAL HYSTERECTOMY    . CHOLECYSTECTOMY    . COLONOSCOPY  07/15/2011   SLF: internal hemorrhoids/hyperplastic polyps in the rectum/tubular adenomaSURVEILLANCE Nov 2017  . defibrillator placed   2009 and 05/2013  . ICD GENERATOR CHANGE  06/17/2013   Dr Caryl Comes  . IMPLANTABLE CARDIOVERTER DEFIBRILLATOR (ICD)  GENERATOR CHANGE Left 06/17/2013   Procedure: ICD GENERATOR CHANGE;  Surgeon: Deboraha Sprang, MD;  Location: [facility] CATH LAB;  Service: Cardiovascular;  Laterality: Left;  . LEAD REVISION N/A 06/17/2013   Procedure: LEAD REVISION;  Surgeon: Deboraha Sprang, MD;  Location: [facility] CATH LAB;  Service: Cardiovascular;  Laterality: N/A;  . ROTATOR CUFF REPAIR Right 2002   Family History  Problem Relation Age of Onset  . Hepatitis Mother 38    HCV  . Liver cancer Mother 80  . Cancer Mother   . Diabetes Mother   . Heart disease Mother   . Hyperlipidemia Mother   . Stroke Sister 45    x3  . Diabetes Sister   . Hyperlipidemia Sister   . Dementia Father   . HIV Sister   . Diabetes Brother   . Heart disease Brother   . Hyperlipidemia Brother    History  Sexual Activity  . Sexual activity: Not Currently  . Birth control/ protection: Surgical    Outpatient Encounter Prescriptions as of 09/08/2016  Medication Sig  . albuterol (PROVENTIL HFA;VENTOLIN HFA) 108 (90 Base) MCG/ACT inhaler Inhale 2 puffs into the lungs every 6 (six) hours as needed for wheezing or shortness of breath.  . ALPRAZolam (XANAX) 1 MG tablet TAKE 1 TABLET BY MOUTH AT BEDTIME AS NEEDED FOR ANXIETY.  Marland Kitchen aspirin 81 MG tablet Take  1 tablet (81 mg total) by mouth daily.  . Aspirin-Acetaminophen (GOODYS BODY PAIN PO) Take 1 Package by mouth daily as needed. For pain  . atorvastatin (LIPITOR) 10 MG tablet Take 1 tablet (10 mg total) by mouth daily.  . budesonide-formoterol (SYMBICORT) 80-4.5 MCG/ACT inhaler Inhale 2 puffs into the lungs 2 (two) times daily.  . carvedilol (COREG) 3.125 MG tablet TAKE (1) TABLET BY MOUTH TWICE DAILY. (Patient taking differently: Take 3.125 mg by mouth 2 (two) times daily with a meal. TAKE (1) TABLET BY MOUTH TWICE DAILY.)  . clopidogrel (PLAVIX) 75 MG tablet Take 1 tablet (75 mg total) by mouth daily.  . ergocalciferol (VITAMIN D2) 50000 units capsule Take 1 capsule (50,000 Units total) by mouth once a  week. One capsule once weekly  . oxyCODONE (ROXICODONE) 15 MG immediate release tablet Take 30 mg by mouth 3 (three) times daily.  . fenofibrate (TRICOR) 145 MG tablet Take 145 mg by mouth daily.   . [DISCONTINUED] cyclobenzaprine (FLEXERIL) 5 MG tablet Take 1 tablet (5 mg total) by mouth 3 (three) times daily as needed for muscle spasms. (Patient not taking: Reported on 09/08/2016)   No facility-administered encounter medications on file as of 09/08/2016.     Activities of Daily Living In your present state of health, do you have any difficulty performing the following activities: 09/08/2016 03/13/2016  Hearing? N N  Vision? N N  Difficulty concentrating or making decisions? N N  Walking or climbing stairs? Y Y  Dressing or bathing? N N  Doing errands, shopping? N N  Preparing Food and eating ? N -  Using the Toilet? N -  In the past six months, have you accidently leaked urine? N -  Do you have problems with loss of bowel control? N -  Managing your Medications? N -  Managing your Finances? N -  Housekeeping or managing your Housekeeping? N -  Some recent data might be hidden    Patient Care Team: Fayrene Helper, MD as PCP - General Danie Binder, MD (Gastroenterology) Deboraha Sprang, MD as Consulting Physician (Cardiology) Kerri Perches, MD as Referring Physician (Ophthalmology) Avelino Leeds, MD as Referring Physician (Internal Medicine)    Assessment:    Exercise Activities and Dietary recommendations Current Exercise Habits: The patient does not participate in regular exercise at present  Goals    . Quit smoking / using tobacco          Starting 09/09/2016 patient would like to cut back on her cigarettes and hopefully next year she have completely quit.      Fall Risk Fall Risk  09/08/2016 09/19/2015  Falls in the past year? No No   Depression Screen PHQ 2/9 Scores 09/08/2016 11/10/2015 02/01/2015 11/08/2013  PHQ - 2 Score 0 0 6 0  PHQ- 9 Score - - 19 -      Cognitive Function  Normal   6CIT Screen 09/08/2016  What Year? 0 points  What month? 0 points  What time? 0 points  Count back from 20 0 points  Months in reverse 0 points  Repeat phrase 0 points  Total Score 0    Immunization History  Administered Date(s) Administered  . Influenza Split 06/19/2011, 06/30/2012  . Influenza Whole 06/25/2006, 08/07/2010  . Influenza,inj,Quad PF,36+ Mos 07/11/2013, 07/26/2015  . Pneumococcal Polysaccharide-23 03/15/2013  . Td 01/16/2004  . Tdap 06/19/2011   Screening Tests Health Maintenance  Topic Date Due  . INFLUENZA VACCINE  09/23/2016 (Originally 03/18/2016)  .  URINE MICROALBUMIN  09/09/2016  . HEMOGLOBIN A1C  09/13/2016  . FOOT EXAM  09/18/2016  . PAP SMEAR  08/28/2017  . MAMMOGRAM  09/02/2017  . OPHTHALMOLOGY EXAM  09/02/2017  . PNEUMOCOCCAL POLYSACCHARIDE VACCINE (2) 03/15/2018  . TETANUS/TDAP  06/18/2021  . COLONOSCOPY  07/15/2021  . Hepatitis C Screening  Completed  . HIV Screening  Completed      Plan:  I have personally reviewed and addressed the Medicare Annual Wellness questionnaire and have noted the following in the patient's chart:  A. Medical and social history B. Use of alcohol, tobacco or illicit drugs  C. Current medications and supplements D. Functional ability and status E.  Nutritional status F.  Physical activity G. Advance directives discussed today H. List of other physicians I.  Hospitalizations, surgeries, and ER visits in previous 12 months J.  [place] to include hearing, vision, cognitive, depression L. Referrals and appointments - none  In addition, I have reviewed and discussed with patient certain preventive protocols, quality metrics, and best practice recommendations. A written personalized care plan for preventive services as well as general preventive health recommendations were provided to patient.  Signed,   Stormy Fabian, LPN Lead Nurse Health Advisor

## 2016-09-17 ENCOUNTER — Ambulatory Visit
Admission: RE | Admit: 2016-09-17 | Discharge: 2016-09-17 | Disposition: A | Discharge status: Home / Self Care | Payer: Medicare Other | Source: Ambulatory Visit | Attending: Family Medicine | Admitting: Family Medicine

## 2016-09-17 DIAGNOSIS — Z1231 Encounter for screening mammogram for malignant neoplasm of breast: Secondary | ICD-10-CM | POA: Diagnosis not present

## 2016-09-17 DIAGNOSIS — Z1239 Encounter for other screening for malignant neoplasm of breast: Secondary | ICD-10-CM

## 2016-09-22 ENCOUNTER — Other Ambulatory Visit: Payer: Self-pay | Admitting: Family Medicine

## 2016-09-22 DIAGNOSIS — I10 Essential (primary) hypertension: Secondary | ICD-10-CM | POA: Diagnosis not present

## 2016-09-22 DIAGNOSIS — E559 Vitamin D deficiency, unspecified: Secondary | ICD-10-CM | POA: Diagnosis not present

## 2016-09-22 DIAGNOSIS — E785 Hyperlipidemia, unspecified: Secondary | ICD-10-CM | POA: Diagnosis not present

## 2016-09-22 DIAGNOSIS — E118 Type 2 diabetes mellitus with unspecified complications: Secondary | ICD-10-CM | POA: Diagnosis not present

## 2016-09-23 LAB — COMPLETE METABOLIC PANEL WITH GFR
ALBUMIN: 4 g/dL (ref 3.6–5.1)
ALK PHOS: 79 U/L (ref 33–130)
ALT: 14 U/L (ref 6–29)
AST: 15 U/L (ref 10–35)
BUN: 10 mg/dL (ref 7–25)
CALCIUM: 9.3 mg/dL (ref 8.6–10.4)
CO2: 24 mmol/L (ref 20–31)
Chloride: 106 mmol/L (ref 98–110)
Creat: 0.8 mg/dL (ref 0.50–1.05)
GFR, EST NON AFRICAN AMERICAN: 85 mL/min (ref >=60)
GFR, Est African American: >89 mL/min (ref >=60)
Glucose, Bld: 105 mg/dL — ABNORMAL HIGH (ref 65–99)
POTASSIUM: 4.3 mmol/L (ref 3.5–5.3)
SODIUM: 138 mmol/L (ref 135–146)
Total Bilirubin: 0.7 mg/dL (ref 0.2–1.2)
Total Protein: 7.5 g/dL (ref 6.1–8.1)

## 2016-09-23 LAB — VITAMIN D 25 HYDROXY (VIT D DEFICIENCY, FRACTURES): VIT D 25 HYDROXY: 20 ng/mL — AB (ref 30–100)

## 2016-09-23 LAB — HEMOGLOBIN A1C
HEMOGLOBIN A1C: 6 % — AB (ref <5.7)
Mean Plasma Glucose: 126 mg/dL

## 2016-09-23 LAB — TSH: TSH: 1.55 m[IU]/L

## 2016-09-24 ENCOUNTER — Encounter: Payer: Self-pay | Admitting: Family Medicine

## 2016-09-24 ENCOUNTER — Ambulatory Visit (INDEPENDENT_AMBULATORY_CARE_PROVIDER_SITE_OTHER): Payer: Medicare Other | Admitting: Family Medicine

## 2016-09-24 VITALS — BP 118/78 | HR 88 | Resp 15 | Ht 66.0 in | Wt 216.0 lb

## 2016-09-24 DIAGNOSIS — E559 Vitamin D deficiency, unspecified: Secondary | ICD-10-CM | POA: Diagnosis not present

## 2016-09-24 DIAGNOSIS — I10 Essential (primary) hypertension: Secondary | ICD-10-CM | POA: Diagnosis not present

## 2016-09-24 DIAGNOSIS — E118 Type 2 diabetes mellitus with unspecified complications: Secondary | ICD-10-CM | POA: Diagnosis not present

## 2016-09-24 DIAGNOSIS — F17218 Nicotine dependence, cigarettes, with other nicotine-induced disorders: Secondary | ICD-10-CM

## 2016-09-24 DIAGNOSIS — Z23 Encounter for immunization: Secondary | ICD-10-CM

## 2016-09-24 DIAGNOSIS — R1030 Lower abdominal pain, unspecified: Secondary | ICD-10-CM

## 2016-09-24 DIAGNOSIS — R0689 Other abnormalities of breathing: Secondary | ICD-10-CM

## 2016-09-24 DIAGNOSIS — E785 Hyperlipidemia, unspecified: Secondary | ICD-10-CM | POA: Diagnosis not present

## 2016-09-24 DIAGNOSIS — F418 Other specified anxiety disorders: Secondary | ICD-10-CM

## 2016-09-24 DIAGNOSIS — R0600 Dyspnea, unspecified: Secondary | ICD-10-CM

## 2016-09-24 DIAGNOSIS — F5105 Insomnia due to other mental disorder: Secondary | ICD-10-CM

## 2016-09-24 LAB — LIPID PANEL
CHOLESTEROL: 135 mg/dL (ref <200)
HDL: 32 mg/dL — AB (ref >50)
LDL Cholesterol: 84 mg/dL (ref <100)
TRIGLYCERIDES: 94 mg/dL (ref <150)
Total CHOL/HDL Ratio: 4.2 Ratio (ref <5.0)
VLDL: 19 mg/dL (ref <30)

## 2016-09-24 MED ORDER — ALPRAZOLAM 1 MG PO TABS: ORAL_TABLET | ORAL | 3 refills | Status: DC

## 2016-09-24 NOTE — Patient Instructions (Addendum)
F/u in 5 month, call if you need me sooner  'Flu vaccine today.  You need to work on stopping smoking  Please discuss 3 month history of increased heart racing wit Dr Jens Som  I recommend lung function  Testing  Good labs  Start once daily vit D3 1000 IU  No medication changes  Fasting lipid, cmp and eGFR, hBA1C and vit D in 5 months        Steps to Quit Smoking Smoking tobacco can be bad for your health. It can also affect almost every organ in your body. Smoking puts you and people around you at risk for many serious long-lasting (chronic) diseases. Quitting smoking is hard, but it is one of the best things that you can do for your health. It is never too late to quit. What are the benefits of quitting smoking? When you quit smoking, you lower your risk for getting serious diseases and conditions. They can include:  Lung cancer or lung disease.  Heart disease.  Stroke.  Heart attack.  Not being able to have children (infertility).  Weak bones (osteoporosis) and broken bones (fractures). If you have coughing, wheezing, and shortness of breath, those symptoms may get better when you quit. You may also get sick less often. If you are pregnant, quitting smoking can help to lower your chances of having a baby of low birth weight. What can I do to help me quit smoking? Talk with your doctor about what can help you quit smoking. Some things you can do (strategies) include:  Quitting smoking totally, instead of slowly cutting back how much you smoke over a period of time.  Going to in-person counseling. You are more likely to quit if you go to many counseling sessions.  Using resources and support systems, such as:  Online chats with a Social worker.  Phone quitlines.  Printed Furniture conservator/restorer.  Support groups or group counseling.  Text messaging programs.  Mobile phone apps or applications.  Taking medicines. Some of these medicines may have nicotine in them. If  you are pregnant or breastfeeding, do not take any medicines to quit smoking unless your doctor says it is okay. Talk with your doctor about counseling or other things that can help you. Talk with your doctor about using more than one strategy at the same time, such as taking medicines while you are also going to in-person counseling. This can help make quitting easier. What things can I do to make it easier to quit? Quitting smoking might feel very hard at first, but there is a lot that you can do to make it easier. Take these steps:  Talk to your family and friends. Ask them to support and encourage you.  Call phone quitlines, reach out to support groups, or work with a Social worker.  Ask people who smoke to not smoke around you.  Avoid places that make you want (trigger) to smoke, such as:  Bars.  Parties.  Smoke-break areas at work.  Spend time with people who do not smoke.  Lower the stress in your life. Stress can make you want to smoke. Try these things to help your stress:  Getting regular exercise.  Deep-breathing exercises.  Yoga.  Meditating.  Doing a body scan. To do this, close your eyes, focus on one area of your body at a time from head to toe, and notice which parts of your body are tense. Try to relax the muscles in those areas.  Download or buy apps on  your mobile phone or tablet that can help you stick to your quit plan. There are many free apps, such as QuitGuide from the [employer] Office manager for Disease Control and Prevention). You can find more support from smokefree.gov and other websites. This information is not intended to replace advice given to you by your health care provider. Make sure you discuss any questions you have with your health care provider. Document Released: 05/31/2009 Document Revised: 04/01/2016 Document Reviewed: 12/19/2014 Elsevier Interactive Patient Education  2017 [employer].

## 2016-09-25 ENCOUNTER — Encounter: Payer: Self-pay | Admitting: Family Medicine

## 2016-09-25 DIAGNOSIS — Z23 Encounter for immunization: Secondary | ICD-10-CM | POA: Insufficient documentation

## 2016-09-25 DIAGNOSIS — R0600 Dyspnea, unspecified: Secondary | ICD-10-CM | POA: Insufficient documentation

## 2016-09-25 DIAGNOSIS — R0689 Other abnormalities of breathing: Secondary | ICD-10-CM

## 2016-09-25 NOTE — Assessment & Plan Note (Signed)

## 2016-09-25 NOTE — Assessment & Plan Note (Signed)
Chronic and unchanged reportedly, no interest in gI eval at this time

## 2016-09-25 NOTE — Assessment & Plan Note (Signed)
Sleep hygiene reviewed and written information offered also. Prescription sent for  medication needed.  

## 2016-09-25 NOTE — Assessment & Plan Note (Signed)
Improved, pt congratulated on this [name] is reminded of the importance of commitment to daily physical activity for 30 minutes or more, as able and the need to limit carbohydrate intake to 30 to 60 grams per meal to help with blood sugar control.    [name] is reminded of the importance of daily foot exam, annual eye examination, and good blood sugar, blood pressure and cholesterol control.  Diabetic Labs Latest Ref Rng & Units 09/22/2016 04/02/2016 03/13/2016 10/10/2015 09/10/2015  HbA1c <5.7 % 6.0(H) - 6.4(H) - 6.6(H)  Microalbumin Not estab mg/dL - - - - <0.2  Micro/Creat Ratio <30 mcg/mg creat - - - - SEE NOTE  Chol <200 mg/dL 135 - 113(L) - 159  HDL >50 mg/dL 32(L) - 27(L) - 31(L)  Calc LDL <100 mg/dL 84 - 64 - 107  Triglycerides <150 mg/dL 94 - 110 - 107  Creatinine 0.50 - 1.05 mg/dL 0.80 0.61 0.81 0.74 0.66  GFR >60.00 mL/min - - - - -   BP/Weight 09/24/2016 09/08/2016 07/31/2016 04/11/2016 04/02/2016 03/13/2016 [id number]  Systolic BP 123456 [id number] 123456 123456 [id number] 123456 [id number]  Diastolic BP 78 72 80 83 64 68 78  Wt. (Lbs) 216 216.04 214 200 200 207 213.4  BMI 34.86 34.87 35.61 33.28 30.41 31.47 32.46   Foot/eye exam completion dates Latest Ref Rng & Units 06/29/2015 08/28/2014  Eye Exam No Retinopathy No Retinopathy -  Foot Form Completion - - Done

## 2016-09-25 NOTE — Progress Notes (Signed)
[name]     MRN: [id number]      DOB: Apr 27, 1966   HPI [name] is here for follow up and re-evaluation of chronic medical conditions, medication management and review of any available recent lab and radiology data.  Preventive health is updated, specifically  Cancer screening and Immunization.   Questions or concerns regarding consultations or procedures which the PT has had in the interim are  addressed. The PT denies any adverse reactions to current medications since the last visit.  C/o reduced exercise tolerance with palpitations in past 2 to 3 months  ROS Denies recent fever or chills. Denies sinus pressure, nasal congestion, ear pain or sore throat. Denies chest congestion, productive cough or wheezing. Denies chest pains, palpitations and leg swelling Denies abdominal pain, nausea, vomiting,diarrhea or constipation.   Denies dysuria, frequency, hesitancy or incontinence. C/o chronic joint pain, and limitation in mobility. Denies headaches, seizures, numbness, or tingling. Denies depression, has mild anxiety and chronic insomnia. Denies skin break down or rash.   PE  BP 118/78   Pulse 88   Resp 15   Ht 5\' 6"  (1.676 m)   Wt 216 lb (98 kg)   SpO2 96%   BMI 34.86 kg/m   Patient alert and oriented and in no cardiopulmonary distress.  HEENT: No facial asymmetry, EOMI,   oropharynx pink and moist.  Neck supple no JVD, no mass.  Chest: Clear to auscultation bilaterally. decreased air entry bilaterally  CVS: S1, S2 no murmurs, no S3.Regular rate.  ABD: Soft non tender.   Ext: No edema  MS: Adequate ROM spine, shoulders, hips and knees.  Skin: Intact, no ulcerations or rash noted.  Psych: Good eye contact, normal affect. Memory intact not anxious or depressed appearing.  CNS: CN 2-12 intact, power,  normal throughout.no focal deficits noted.   Assessment & Plan  HTN, goal below 130/80 Controlled, no change in medication DASH diet and commitment  to daily physical activity for a minimum of 30 minutes discussed and encouraged, as a part of hypertension management. The importance of attaining a healthy weight is also discussed.  BP/Weight 09/24/2016 09/08/2016 07/31/2016 04/11/2016 04/02/2016 03/13/2016 [id number]  Systolic BP 123456 [id number] 123456 123456 [id number] 123456 [id number]  Diastolic BP 78 72 80 83 64 68 78  Wt. (Lbs) 216 216.04 214 200 200 207 213.4  BMI 34.86 34.87 35.61 33.28 30.41 31.47 32.46       Nicotine dependence Patient counseled for approximately 5 minutes regarding the health risks of ongoing nicotine use, specifically all types of cancer, heart disease, stroke and respiratory failure. The options available for help with cessation ,the behavioral changes to assist the process, and the option to either gradully reduce usage  Or abruptly stop.is also discussed. Pt is also encouraged to set specific goals in number of cigarettes used daily, as well as to set a quit date.     Vitamin D deficiency Untreated, pt to commit to daily supplement, 1000 IU  Lower abdominal pain Chronic and unchanged reportedly, no interest in gI eval at this time  Insomnia secondary to depression with anxiety Sleep hygiene reviewed and written information offered also. Prescription sent for  medication needed.   Hyperlipidemia LDL goal <100 Hyperlipidemia:Low fat diet discussed and encouraged.   Lipid Panel  Lab Results  Component Value Date   CHOL 135 09/22/2016   HDL 32 (L) 09/22/2016   LDLCALC 84 09/22/2016   TRIG 94 09/22/2016   CHOLHDL 4.2 09/22/2016   Encouraged  to increase physical activity   Dyspnea and respiratory abnormalities 2 ,month h/o worsened dyspnea and poor exercise tolerance, recommend pulmonary evaluation, pt elects to wait on cardiology re assessment as she also reports palpitations with activity  Diabetes mellitus type 2 with complications ([place]) Improved, pt congratulated on this [name] is reminded of the importance of  commitment to daily physical activity for 30 minutes or more, as able and the need to limit carbohydrate intake to 30 to 60 grams per meal to help with blood sugar control.    [name] is reminded of the importance of daily foot exam, annual eye examination, and good blood sugar, blood pressure and cholesterol control.  Diabetic Labs Latest Ref Rng & Units 09/22/2016 04/02/2016 03/13/2016 10/10/2015 09/10/2015  HbA1c <5.7 % 6.0(H) - 6.4(H) - 6.6(H)  Microalbumin Not estab mg/dL - - - - <0.2  Micro/Creat Ratio <30 mcg/mg creat - - - - SEE NOTE  Chol <200 mg/dL 135 - 113(L) - 159  HDL >50 mg/dL 32(L) - 27(L) - 31(L)  Calc LDL <100 mg/dL 84 - 64 - 107  Triglycerides <150 mg/dL 94 - 110 - 107  Creatinine 0.50 - 1.05 mg/dL 0.80 0.61 0.81 0.74 0.66  GFR >60.00 mL/min - - - - -   BP/Weight 09/24/2016 09/08/2016 07/31/2016 04/11/2016 04/02/2016 03/13/2016 [id number]  Systolic BP 123456 A999333 123456 123456 [id number] 123456 [id number]  Diastolic BP 78 72 80 83 64 68 78  Wt. (Lbs) 216 216.04 214 200 200 207 213.4  BMI 34.86 34.87 35.61 33.28 30.41 31.47 32.46   Foot/eye exam completion dates Latest Ref Rng & Units 06/29/2015 08/28/2014  Eye Exam No Retinopathy No Retinopathy -  Foot Form Completion - - Done        Need for influenza vaccination After obtaining informed consent, the vaccine is  administered by LPN.

## 2016-09-25 NOTE — Assessment & Plan Note (Signed)
Controlled, no change in medication DASH diet and commitment to daily physical activity for a minimum of 30 minutes discussed and encouraged, as a part of hypertension management. The importance of attaining a healthy weight is also discussed.  BP/Weight 09/24/2016 09/08/2016 07/31/2016 04/11/2016 04/02/2016 03/13/2016 [id number]  Systolic BP 123456 A999333 123456 123456 [id number] 123456 [id number]  Diastolic BP 78 72 80 83 64 68 78  Wt. (Lbs) 216 216.04 214 200 200 207 213.4  BMI 34.86 34.87 35.61 33.28 30.41 31.47 32.46

## 2016-09-25 NOTE — Assessment & Plan Note (Signed)
2 ,month h/o worsened dyspnea and poor exercise tolerance, recommend pulmonary evaluation, pt elects to wait on cardiology re assessment as she also reports palpitations with activity

## 2016-09-25 NOTE — Assessment & Plan Note (Signed)
Untreated, pt to commit to daily supplement, 1000 IU

## 2016-09-25 NOTE — Assessment & Plan Note (Signed)
Hyperlipidemia:Low fat diet discussed and encouraged.   Lipid Panel  Lab Results  Component Value Date   CHOL 135 09/22/2016   HDL 32 (L) 09/22/2016   LDLCALC 84 09/22/2016   TRIG 94 09/22/2016   CHOLHDL 4.2 09/22/2016   Encouraged to increase physical activity

## 2016-09-25 NOTE — Assessment & Plan Note (Signed)
After obtaining informed consent, the vaccine is  administered by LPN.  

## 2016-09-26 ENCOUNTER — Telehealth: Payer: Self-pay | Admitting: Internal Medicine

## 2016-09-26 NOTE — Telephone Encounter (Signed)
New Message     Please call pt is concerned she feels that her heart is racing at times , she has appt 10/22/16 but thinks there is something awrong and you may want to see her sooner

## 2016-09-26 NOTE — Telephone Encounter (Signed)
I left a message for the patient to call. 

## 2016-10-16 ENCOUNTER — Encounter: Payer: Self-pay | Admitting: Internal Medicine

## 2016-10-20 ENCOUNTER — Telehealth: Payer: Self-pay | Admitting: Cardiology

## 2016-10-20 MED ORDER — CARVEDILOL 3.125 MG PO TABS: ORAL_TABLET | ORAL | Status: DC

## 2016-10-20 NOTE — Telephone Encounter (Signed)
Reviewed episodes from 10/12/16 with Chanetta Marshall, NP. Episodes thought to be SVT. Coreg increased to 6.25 BID until appt with Caryl Comes on Friday 10/24/16. [name] understanding. She has enough medication to double dose.

## 2016-10-20 NOTE — Telephone Encounter (Signed)
Pt called and stated that she has been having numerous arrythima episodes. Pt stated that when she has these episodes she experiences short of breath, dizzy, fatigue, chest pain. She stated that when she walks short distances she feels like she is going to pass out b/c her heart rate feels really high. Instructed pt to send remote transmission. transmission received. Call forward to device tech RN.

## 2016-10-22 ENCOUNTER — Encounter: Payer: Medicare Other | Admitting: Internal Medicine

## 2016-10-24 ENCOUNTER — Encounter: Payer: Self-pay | Admitting: Internal Medicine

## 2016-10-24 ENCOUNTER — Ambulatory Visit (INDEPENDENT_AMBULATORY_CARE_PROVIDER_SITE_OTHER): Payer: Medicare Other | Admitting: Internal Medicine

## 2016-10-24 VITALS — BP 120/76 | HR 77 | Ht 66.0 in | Wt 217.0 lb

## 2016-10-24 DIAGNOSIS — I472 Ventricular tachycardia, unspecified: Secondary | ICD-10-CM

## 2016-10-24 DIAGNOSIS — Z95810 Presence of automatic (implantable) cardiac defibrillator: Secondary | ICD-10-CM | POA: Diagnosis not present

## 2016-10-24 DIAGNOSIS — I255 Ischemic cardiomyopathy: Secondary | ICD-10-CM

## 2016-10-24 DIAGNOSIS — I428 Other cardiomyopathies: Secondary | ICD-10-CM | POA: Diagnosis not present

## 2016-10-24 LAB — CUP PACEART INCLINIC DEVICE CHECK
Date Time Interrogation Session: 20180309172307
Implantable Lead Implant Date: 20141031
MDC IDC LEAD LOCATION: 753860
MDC IDC PG IMPLANT DT: 20141031

## 2016-10-24 NOTE — Patient Instructions (Signed)
Medication Instructions: - Your physician recommends that you continue on your current medications as directed. Please refer to the Current Medication list given to you today.  Labwork: - Your physician recommends that you have lab work today: Magnesium  Procedures/Testing: - none ordered  Follow-Up: - Your physician recommends that you schedule a follow-up appointment in: 3-4 weeks   Any Additional Special Instructions Will Be Listed Below (If Applicable).     If you need a refill on your cardiac medications before your next appointment, please call your pharmacy.

## 2016-10-24 NOTE — Progress Notes (Signed)
Electrophysiology Office Note   Date:  10/24/2016   ID:  [name], DOB May 16, 1966, MRN [id number]  PCP:  Tula Nakayama, MD  Cardiologist:   Primary Electrophysiologist:    No chief complaint on file.    History of Present Illness: [name] is a 51 y.o. female  Seen in followup for  s/p ICD implanted for primary prevention and 10/14 underwent generator replacement with revision of her 6949-lead.   Recent nuclear imaging (3/11) listed under NOTES-CV procedure--demonstrated ejection fraction of 30% with an infarct and no ischemia  she is a prior inferior wall MI he treated with stenting.  Echocardiogram 10/14 demonstrated "severe depressed LV function"     She has complaints of tachypalpitations.  These correlated with documented nonsustained ventricular tachycardia occurring below her detection rate  She's had problems with dyspnea on exertion which are intermittent   Past Medical History:  Diagnosis Date  . Arthritis of knee   . Automatic implantable cardiac defibrillator in situ    a. s/p prior Medtronic ICD with 6949 lead. b. s/p Gen change & lead revision 05/2013.  . Cardiomyopathy secondary    Patient has cardiomyopathy out of proportion to her ischemic heart disease  . coronary artery disease    RCA stenting Myoview 2011 EF 30% infarction dilatation without ischemia  . Depression   . Diabetes mellitus   . GERD (gastroesophageal reflux disease)   . Insomnia   . Lupus (systemic lupus erythematosus) ([place])   . Migraines   . Myocardial infarction    8 total   . Nicotine abuse   . Other and unspecified hyperlipidemia   . Paroxysmal VT ([place])   . Systolic CHF ([place])   . VF (ventricular fibrillation) ([place])    a. Hx appropriate ICD therapy for VF.    Current Outpatient Prescriptions  Medication Sig Dispense Refill  . albuterol (PROVENTIL HFA;VENTOLIN HFA) 108 (90 Base) MCG/ACT inhaler Inhale 2 puffs into the lungs every 6 (six) hours as needed for  wheezing or shortness of breath. 1 Inhaler 0  . ALPRAZolam (XANAX) 1 MG tablet TAKE 1 TABLET BY MOUTH AT BEDTIME AS NEEDED FOR ANXIETY. 30 tablet 3  . aspirin 81 MG tablet Take 1 tablet (81 mg total) by mouth daily. 30 tablet 6  . Aspirin-Acetaminophen (GOODYS BODY PAIN PO) Take 1 Package by mouth daily as needed. For pain    . atorvastatin (LIPITOR) 10 MG tablet Take 1 tablet (10 mg total) by mouth daily. 90 tablet 1  . budesonide-formoterol (SYMBICORT) 80-4.5 MCG/ACT inhaler Inhale 2 puffs into the lungs 2 (two) times daily. 1 Inhaler 3  . carvedilol (COREG) 3.125 MG tablet TAKE (2) TABLETS BY MOUTH TWICE DAILY.    Marland Kitchen clopidogrel (PLAVIX) 75 MG tablet Take 1 tablet (75 mg total) by mouth daily. 90 tablet 1  . ergocalciferol (VITAMIN D2) 50000 units capsule Take 1 capsule (50,000 Units total) by mouth once a week. One capsule once weekly 12 capsule 1  . fenofibrate (TRICOR) 145 MG tablet Take 145 mg by mouth daily.     . furosemide (LASIX) 20 MG tablet Take 2 tablets(40 mg)  by mouth as needed for swelling and edema    . oxyCODONE (ROXICODONE) 15 MG immediate release tablet Take 30 mg by mouth 3 (three) times daily.     No current facility-administered medications for this visit.     Allergies:   Bee venom; Cortisone; and Robaxin [methocarbamol]   Social History:  The patient  reports  that she has been smoking Cigarettes.  She has a 7.50 pack-year smoking history. She has never used smokeless tobacco. She reports that she drinks alcohol. She reports that she does not use drugs.   Family History:  The patient's family history includes Cancer in her mother; Dementia in her father; Diabetes in her brother, mother, and sister; HIV in her sister; Heart disease in her brother and mother; Hepatitis (age of onset: 44) in her mother; Hyperlipidemia in her brother, mother, and sister; Liver cancer (age of onset: 68) in her mother; Stroke (age of onset: 70) in her sister.    ROS:  Please see the history  of present illness.   Positive for ,   All other systems are reviewed and negative.    PHYSICAL EXAM: VS:  BP 120/76   Pulse 77   Ht 5\' 6"  (1.676 m)   Wt 217 lb (98.4 kg)   SpO2 95%   BMI 35.02 kg/m  , BMI Body mass index is 35.02 kg/m. GEN: Well nourished, well developed, in no acute distress  HEENT: normal  Neck: no JVD, carotid bruits, or masses Cardiac: RRR; no murmurs, rubs, or gallops,no edema  Respiratory:  clear to auscultation bilaterally, normal work of breathing Device pocket well healed; without hematoma or erythema.  There is no tethering  GI: soft, nontender, nondistended, + BS MS: no deformity or atrophy  Skin: warm and dry,  device pocket is well healed Neuro:  Strength and sensation are intact Psych: euthymic mood, full affect  EKG:  EKG  Sinus rhythm at 73 16/12/38 Axis L I ST-T changes inferolaterally   .   Device interrogation is reviewed today in detail.  See PaceArt for details.  Recent Labs: 04/02/2016: Hemoglobin 13.7; Platelets 290 09/22/2016: ALT 14; BUN 10; Creat 0.80; Potassium 4.3; Sodium 138; TSH 1.55  09/22/2016: Cholesterol 135; HDL 32; LDL Cholesterol 84; Total CHOL/HDL Ratio 4.2; Triglycerides 94; VLDL 19     CrCl cannot be calculated (Patient's most recent lab result is older than the maximum 21 days allowed.).   Wt Readings from Last 3 Encounters:  10/24/16 217 lb (98.4 kg)  09/24/16 216 lb (98 kg)  09/08/16 216 lb 0.6 oz (98 kg)         ASSESSMENT AND PLAN:     Ischemic/nonischemic cardiomyopathy  Implantable defibrillator-Medtronic S./P. revision   Ventricular tachycardia  nonsustained   Congestive heart failure-chronic-systolic  She's had no interval treated ventricular tachycardia. She has had symptomatic nonsustained tachycardia. We discussed options of antiarrhythmic therapy; she would like not to take more medications. We will however increase her carvedilol. He was recently increased 3.125--6.25. Her blood pressure  is quite good today; we will have her return in about 3 or 4 weeks. If her blood pressure remains adequate we will increase it again.  I would be inclined to give her magnesium over-the-counter supplements if she is willing   Euvolemic continue current meds although her optivol is pending. We'll check that when she returns   Signed, Virl Axe   10/24/2016 2:50 PM      [place]  [id number]  [phone] (office) [phone] (fax)

## 2016-10-25 LAB — MAGNESIUM: MAGNESIUM: 2 mg/dL (ref 1.6–2.3)

## 2016-10-28 ENCOUNTER — Telehealth: Payer: Self-pay

## 2016-10-28 NOTE — Telephone Encounter (Signed)
Pt is aware and agreeable to normal lab results 

## 2016-11-11 DIAGNOSIS — H524 Presbyopia: Secondary | ICD-10-CM | POA: Diagnosis not present

## 2016-11-11 DIAGNOSIS — H40003 Preglaucoma, unspecified, bilateral: Secondary | ICD-10-CM | POA: Diagnosis not present

## 2016-11-11 DIAGNOSIS — H02844 Edema of left upper eyelid: Secondary | ICD-10-CM | POA: Diagnosis not present

## 2016-11-11 DIAGNOSIS — H52209 Unspecified astigmatism, unspecified eye: Secondary | ICD-10-CM | POA: Diagnosis not present

## 2016-11-11 DIAGNOSIS — H0289 Other specified disorders of eyelid: Secondary | ICD-10-CM | POA: Diagnosis not present

## 2016-11-11 DIAGNOSIS — H527 Unspecified disorder of refraction: Secondary | ICD-10-CM | POA: Diagnosis not present

## 2016-11-11 DIAGNOSIS — M329 Systemic lupus erythematosus, unspecified: Secondary | ICD-10-CM | POA: Diagnosis not present

## 2016-11-11 DIAGNOSIS — H5210 Myopia, unspecified eye: Secondary | ICD-10-CM | POA: Diagnosis not present

## 2016-11-11 DIAGNOSIS — Z83511 Family history of glaucoma: Secondary | ICD-10-CM | POA: Diagnosis not present

## 2016-11-11 DIAGNOSIS — H2513 Age-related nuclear cataract, bilateral: Secondary | ICD-10-CM | POA: Diagnosis not present

## 2016-11-11 DIAGNOSIS — Z79899 Other long term (current) drug therapy: Secondary | ICD-10-CM | POA: Diagnosis not present

## 2016-11-14 ENCOUNTER — Encounter: Payer: Self-pay | Admitting: Internal Medicine

## 2016-11-14 ENCOUNTER — Ambulatory Visit (INDEPENDENT_AMBULATORY_CARE_PROVIDER_SITE_OTHER): Payer: Medicare Other | Admitting: Internal Medicine

## 2016-11-14 VITALS — BP 100/72 | HR 89 | Ht 70.0 in | Wt 213.0 lb

## 2016-11-14 DIAGNOSIS — I5022 Chronic systolic (congestive) heart failure: Secondary | ICD-10-CM

## 2016-11-14 DIAGNOSIS — I472 Ventricular tachycardia, unspecified: Secondary | ICD-10-CM

## 2016-11-14 DIAGNOSIS — I255 Ischemic cardiomyopathy: Secondary | ICD-10-CM

## 2016-11-14 DIAGNOSIS — Z95810 Presence of automatic (implantable) cardiac defibrillator: Secondary | ICD-10-CM

## 2016-11-14 DIAGNOSIS — I428 Other cardiomyopathies: Secondary | ICD-10-CM | POA: Diagnosis not present

## 2016-11-14 NOTE — Progress Notes (Signed)
Electrophysiology Office Note   Date:  11/14/2016   ID:  Rachel, Day [birth year], MRN [id number]  PCP:  Tula Nakayama, MD  Cardiologist:   Primary Electrophysiologist:    No chief complaint on file.    History of Present Illness: [name] is a 51 y.o. female  Seen in followup for  s/p ICD implanted for primary prevention and 10/14 underwent generator replacement with revision of her 6949-lead.   Recent nuclear imaging (3/11) listed under NOTES-CV procedure--demonstrated ejection fraction of 30% with an infarct and no ischemia  she is a prior inferior wall MI he treated with stenting.  Echocardiogram 10/14 demonstrated "severe depressed LV function"     Seen last month she had episodes of ventricular tachycardia that were sustained below her detection rate. Monitoring function was activated.  She has had no interval palpitations. No documented ventricular tachycardia  She's had problems with dyspnea on exertion which are intermittent. Tolerated up titration of her carvedilol. No lightheadedness not withstanding blood pressure systolic about 400   Past Medical History:  Diagnosis Date  . Arthritis of knee   . Automatic implantable cardiac defibrillator in situ    a. s/p prior Medtronic ICD with 6949 lead. b. s/p Gen change & lead revision 05/2013.  . Cardiomyopathy secondary    Patient has cardiomyopathy out of proportion to her ischemic heart disease  . coronary artery disease    RCA stenting Myoview 2011 EF 30% infarction dilatation without ischemia  . Depression   . Diabetes mellitus   . GERD (gastroesophageal reflux disease)   . Insomnia   . Lupus (systemic lupus erythematosus) ([place])   . Migraines   . Myocardial infarction    8 total   . Nicotine abuse   . Other and unspecified hyperlipidemia   . Paroxysmal VT ([place])   . Systolic CHF ([place])   . VF (ventricular fibrillation) ([place])    a. Hx appropriate ICD therapy for VF.    Current Outpatient  Prescriptions  Medication Sig Dispense Refill  . albuterol (PROVENTIL HFA;VENTOLIN HFA) 108 (90 Base) MCG/ACT inhaler Inhale 2 puffs into the lungs every 6 (six) hours as needed for wheezing or shortness of breath. 1 Inhaler 0  . ALPRAZolam (XANAX) 1 MG tablet TAKE 1 TABLET BY MOUTH AT BEDTIME AS NEEDED FOR ANXIETY. 30 tablet 3  . aspirin 81 MG tablet Take 1 tablet (81 mg total) by mouth daily. 30 tablet 6  . Aspirin-Acetaminophen (GOODYS BODY PAIN PO) Take 1 Package by mouth daily as needed. For pain    . atorvastatin (LIPITOR) 10 MG tablet Take 1 tablet (10 mg total) by mouth daily. 90 tablet 1  . budesonide-formoterol (SYMBICORT) 80-4.5 MCG/ACT inhaler Inhale 2 puffs into the lungs 2 (two) times daily. 1 Inhaler 3  . carvedilol (COREG) 3.125 MG tablet TAKE (2) TABLETS BY MOUTH TWICE DAILY.    Marland Kitchen clopidogrel (PLAVIX) 75 MG tablet Take 1 tablet (75 mg total) by mouth daily. 90 tablet 1  . ergocalciferol (VITAMIN D2) 50000 units capsule Take 1 capsule (50,000 Units total) by mouth once a week. One capsule once weekly 12 capsule 1  . fenofibrate (TRICOR) 145 MG tablet Take 145 mg by mouth daily.     . furosemide (LASIX) 20 MG tablet Take 2 tablets(40 mg)  by mouth as needed for swelling and edema    . oxyCODONE (ROXICODONE) 15 MG immediate release tablet Take 30 mg by mouth 3 (three) times daily.     No  current facility-administered medications for this visit.     Allergies:   Bee venom; Cortisone; and Robaxin [methocarbamol]   Social History:  The patient  reports that she has been smoking Cigarettes.  She has a 7.50 pack-year smoking history. She has never used smokeless tobacco. She reports that she drinks alcohol. She reports that she does not use drugs.   Family History:  The patient's family history includes Cancer in her mother; Dementia in her father; Diabetes in her brother, mother, and sister; HIV in her sister; Heart disease in her brother and mother; Hepatitis (age of onset: 77) in  her mother; Hyperlipidemia in her brother, mother, and sister; Liver cancer (age of onset: 27) in her mother; Stroke (age of onset: 3) in her sister.    ROS:  Please see the history of present illness.   Positive for ,   All other systems are reviewed and negative.    PHYSICAL EXAM: VS:  BP 100/72   Pulse 89   Ht 5\' 10"  (1.778 m)   Wt 213 lb (96.6 kg)   SpO2 94%   BMI 30.56 kg/m  , BMI Body mass index is 30.56 kg/m. Well developed and nourished in no acute distress HENT normal Neck supple with JVP-flat Clear Regular rate and rhythm, no murmurs or gallops Abd-soft with active BS No Clubbing cyanosis edema Skin-warm and dry A & Oriented  Grossly normal sensory and motor function   EKG:  EKG  Sinus rhythm at 86 16/12/42 Axis 40 ST-T changes inferolaterally  Isolated PVC of March  .   Device interrogation is reviewed today in detail.  See PaceArt for details.  Recent Labs: 04/02/2016: Hemoglobin 13.7; Platelets 290 09/22/2016: ALT 14; BUN 10; Creat 0.80; Potassium 4.3; Sodium 138; TSH 1.55 10/24/2016: Magnesium 2.0  09/22/2016: Cholesterol 135; HDL 32; LDL Cholesterol 84; Total CHOL/HDL Ratio 4.2; Triglycerides 94; VLDL 19     CrCl cannot be calculated (Patient's most recent lab result is older than the maximum 21 days allowed.).   Wt Readings from Last 3 Encounters:  11/14/16 213 lb (96.6 kg)  10/24/16 217 lb (98.4 kg)  09/24/16 216 lb (98 kg)         ASSESSMENT AND PLAN:     Ischemic/nonischemic cardiomyopathy  Implantable defibrillator-Medtronic S./P. revision   Ventricular tachycardia  nonsustained   Congestive heart failure-chronic-systolic  Optivol elevated  Blood pressure limits up titration of her medications.  Her optivol is improving; she has increased her diuretics. We will enroll her in the ICU in clinic.  No interval sustained ventricular tachycardia. She continues to have nonsustained VT      Signed, Virl Axe   11/14/2016 4:32 PM       [place] [address]  [phone] (office) [phone] (fax)

## 2016-11-14 NOTE — Patient Instructions (Signed)
Medication Instructions: - Your physician recommends that you continue on your current medications as directed. Please refer to the Current Medication list given to you today.  Labwork: - none ordered  Procedures/Testing: - none ordered  Follow-Up: - Remote monitoring is used to monitor your Pacemaker of ICD from home. This monitoring reduces the number of office visits required to check your device to one time per year. It allows [place] to keep an eye on the functioning of your device to ensure it is working properly. You are scheduled for a device check from home on 12/15/16- fluid check only. You may send your transmission at any time that day. If you have a wireless device, the transmission will be sent automatically. After your physician reviews your transmission, you will receive a postcard with your next transmission date.  - Your physician wants you to follow-up in: 6 months with Dr. Caryl Comes. You will receive a reminder letter in the mail two months in advance. If you don't receive a letter, please call our office to schedule the follow-up appointment.  Any Additional Special Instructions Will Be Listed Below (If Applicable).     If you need a refill on your cardiac medications before your next appointment, please call your pharmacy.

## 2016-11-21 NOTE — Progress Notes (Signed)
Referred to ICM clinic by Dr Othella Boyer Mikle Bosworth, RN.  Enrollment discussed with patient at time of last office visit and 1st ICM remote transmission scheduled for 12/15/2016.

## 2016-12-05 ENCOUNTER — Encounter (HOSPITAL_COMMUNITY): Payer: Self-pay | Admitting: Emergency Medicine

## 2016-12-05 ENCOUNTER — Emergency Department (HOSPITAL_COMMUNITY): Payer: Medicare Other

## 2016-12-05 ENCOUNTER — Emergency Department (HOSPITAL_COMMUNITY)
Admission: EM | Admit: 2016-12-05 | Discharge: 2016-12-05 | Disposition: A | Discharge status: Home / Self Care | Payer: Medicare Other | Attending: Emergency Medicine | Admitting: Emergency Medicine

## 2016-12-05 DIAGNOSIS — Y999 Unspecified external cause status: Secondary | ICD-10-CM | POA: Diagnosis not present

## 2016-12-05 DIAGNOSIS — S39012A Strain of muscle, fascia and tendon of lower back, initial encounter: Secondary | ICD-10-CM | POA: Diagnosis not present

## 2016-12-05 DIAGNOSIS — Y929 Unspecified place or not applicable: Secondary | ICD-10-CM | POA: Insufficient documentation

## 2016-12-05 DIAGNOSIS — R109 Unspecified abdominal pain: Secondary | ICD-10-CM | POA: Diagnosis not present

## 2016-12-05 DIAGNOSIS — X58XXXA Exposure to other specified factors, initial encounter: Secondary | ICD-10-CM | POA: Insufficient documentation

## 2016-12-05 DIAGNOSIS — R1084 Generalized abdominal pain: Secondary | ICD-10-CM | POA: Diagnosis not present

## 2016-12-05 DIAGNOSIS — S3992XA Unspecified injury of lower back, initial encounter: Secondary | ICD-10-CM | POA: Diagnosis present

## 2016-12-05 DIAGNOSIS — I2510 Atherosclerotic heart disease of native coronary artery without angina pectoris: Secondary | ICD-10-CM | POA: Diagnosis not present

## 2016-12-05 DIAGNOSIS — Y939 Activity, unspecified: Secondary | ICD-10-CM | POA: Diagnosis not present

## 2016-12-05 DIAGNOSIS — J449 Chronic obstructive pulmonary disease, unspecified: Secondary | ICD-10-CM | POA: Insufficient documentation

## 2016-12-05 DIAGNOSIS — E119 Type 2 diabetes mellitus without complications: Secondary | ICD-10-CM | POA: Diagnosis not present

## 2016-12-05 DIAGNOSIS — E876 Hypokalemia: Secondary | ICD-10-CM

## 2016-12-05 DIAGNOSIS — Z7982 Long term (current) use of aspirin: Secondary | ICD-10-CM | POA: Insufficient documentation

## 2016-12-05 DIAGNOSIS — I5020 Unspecified systolic (congestive) heart failure: Secondary | ICD-10-CM | POA: Diagnosis not present

## 2016-12-05 DIAGNOSIS — F17210 Nicotine dependence, cigarettes, uncomplicated: Secondary | ICD-10-CM | POA: Insufficient documentation

## 2016-12-05 LAB — CBC WITH DIFFERENTIAL/PLATELET
Basophils Absolute: 0.1 10*3/uL (ref 0.0–0.1)
Basophils Relative: 1 %
EOS ABS: 0.1 10*3/uL (ref 0.0–0.7)
Eosinophils Relative: 2 %
HEMATOCRIT: 39.9 % (ref 36.0–46.0)
HEMOGLOBIN: 13.1 g/dL (ref 12.0–15.0)
LYMPHS ABS: 2.5 10*3/uL (ref 0.7–4.0)
Lymphocytes Relative: 44 %
MCH: 27.1 pg (ref 26.0–34.0)
MCHC: 32.8 g/dL (ref 30.0–36.0)
MCV: 82.4 fL (ref 78.0–100.0)
Monocytes Absolute: 0.4 10*3/uL (ref 0.1–1.0)
Monocytes Relative: 7 %
NEUTROS ABS: 2.6 10*3/uL (ref 1.7–7.7)
NEUTROS PCT: 46 %
Platelets: 269 10*3/uL (ref 150–400)
RBC: 4.84 MIL/uL (ref 3.87–5.11)
RDW: 13 % (ref 11.5–15.5)
WBC: 5.6 10*3/uL (ref 4.0–10.5)

## 2016-12-05 LAB — URINALYSIS, ROUTINE W REFLEX MICROSCOPIC
Bilirubin Urine: NEGATIVE
Glucose, UA: NEGATIVE mg/dL
KETONES UR: NEGATIVE mg/dL
Nitrite: NEGATIVE
PROTEIN: NEGATIVE mg/dL
Specific Gravity, Urine: 1.005 (ref 1.005–1.030)
pH: 6 (ref 5.0–8.0)

## 2016-12-05 LAB — COMPREHENSIVE METABOLIC PANEL
ALT: 18 U/L (ref 14–54)
ANION GAP: 8 (ref 5–15)
AST: 20 U/L (ref 15–41)
Albumin: 3.7 g/dL (ref 3.5–5.0)
Alkaline Phosphatase: 95 U/L (ref 38–126)
BUN: 7 mg/dL (ref 6–20)
CHLORIDE: 103 mmol/L (ref 101–111)
CO2: 27 mmol/L (ref 22–32)
CREATININE: 0.72 mg/dL (ref 0.44–1.00)
Calcium: 9.2 mg/dL (ref 8.9–10.3)
Glucose, Bld: 163 mg/dL — ABNORMAL HIGH (ref 65–99)
Potassium: 2.8 mmol/L — ABNORMAL LOW (ref 3.5–5.1)
SODIUM: 138 mmol/L (ref 135–145)
Total Bilirubin: 0.4 mg/dL (ref 0.3–1.2)
Total Protein: 7.8 g/dL (ref 6.5–8.1)

## 2016-12-05 LAB — LIPASE, BLOOD: Lipase: 80 U/L — ABNORMAL HIGH (ref 11–51)

## 2016-12-05 MED ORDER — IOPAMIDOL (ISOVUE-300) INJECTION 61%
100.0000 mL | Freq: Once | INTRAVENOUS | Status: AC | PRN
  Administered 2016-12-05: 100 mL via INTRAVENOUS

## 2016-12-05 MED ORDER — FENTANYL CITRATE (PF) 100 MCG/2ML IJ SOLN
100.0000 ug | Freq: Once | INTRAMUSCULAR | Status: AC
  Administered 2016-12-05: 100 ug via INTRAVENOUS
  Filled 2016-12-05: qty 2

## 2016-12-05 MED ORDER — ONDANSETRON 8 MG PO TBDP
8.0000 mg | ORAL_TABLET | Freq: Once | ORAL | Status: AC
  Administered 2016-12-05: 8 mg via ORAL
  Filled 2016-12-05: qty 1

## 2016-12-05 MED ORDER — POTASSIUM CHLORIDE CRYS ER 20 MEQ PO TBCR
40.0000 meq | EXTENDED_RELEASE_TABLET | Freq: Once | ORAL | Status: AC
Start: 2016-12-05 — End: 2016-12-05
  Administered 2016-12-05: 40 meq via ORAL
  Filled 2016-12-05: qty 2

## 2016-12-05 MED ORDER — POTASSIUM CHLORIDE CRYS ER 20 MEQ PO TBCR: 20.0000 meq | EXTENDED_RELEASE_TABLET | Freq: Every day | ORAL | 0 refills | Status: DC

## 2016-12-05 MED ORDER — HYDROCODONE-ACETAMINOPHEN 5-325 MG PO TABS
2.0000 | ORAL_TABLET | Freq: Once | ORAL | Status: AC
  Administered 2016-12-05: 2 via ORAL
  Filled 2016-12-05: qty 2

## 2016-12-05 NOTE — Discharge Instructions (Signed)

## 2016-12-05 NOTE — ED Provider Notes (Addendum)
[facility] DEPT Provider Note   CSN: [id number] Arrival date & time: 12/05/16  0251     History   Chief Complaint Chief Complaint  Patient presents with  . Abdominal Pain  . Back Pain    HPI SADEEL FIDDLER is a 51 y.o. female.  The history is provided by the patient.  Abdominal Pain   This is a new problem. The current episode started yesterday. The problem occurs constantly. The problem has been gradually worsening. The pain is located in the LLQ and RLQ. The pain is moderate. Pertinent negatives include fever and dysuria. The symptoms are aggravated by certain positions and palpation. Nothing relieves the symptoms.  Back Pain   Associated symptoms include abdominal pain. Pertinent negatives include no chest pain, no fever, no dysuria and no weakness.   Patient presents with abdominal pain/back pain over past day No fever/vomiting No dysuria She reports pain in her abdomen radiates to back No focal weakness No difficulty urinating, no incontinence reported No other acute complaints Past Medical History:  Diagnosis Date  . Arthritis of knee   . Automatic implantable cardiac defibrillator in situ    a. s/p prior Medtronic ICD with 6949 lead. b. s/p Gen change & lead revision 05/2013.  . Cardiomyopathy secondary    Patient has cardiomyopathy out of proportion to her ischemic heart disease  . coronary artery disease    RCA stenting Myoview 2011 EF 30% infarction dilatation without ischemia  . Depression   . Diabetes mellitus   . GERD (gastroesophageal reflux disease)   . Insomnia   . Lupus (systemic lupus erythematosus) ([place])   . Migraines   . Myocardial infarction ([place])    8 total   . Nicotine abuse   . Other and unspecified hyperlipidemia   . Paroxysmal VT ([place])   . Systolic CHF ([place])   . VF (ventricular fibrillation) ([place])    a. Hx appropriate ICD therapy for VF.    Patient Active Problem List   Diagnosis Date Noted  . Dyspnea and respiratory  abnormalities 09/25/2016  . Need for influenza vaccination 09/25/2016  . COPD exacerbation ([place]) 04/05/2016  . Vitamin D deficiency 07/26/2015  . Depression with anxiety 03/11/2015  . Metabolic syndrome X 54/56/2563  . Adhesive capsulitis of left shoulder 09/26/2013  . Ventricular tachycardia (paroxysmal) ([place]) 12/17/2012  . Nicotine dependence 11/07/2012  . HSV-2 seropositive 11/05/2012  . H/O abnormal Pap smear 11/02/2012  . Lower abdominal pain 08/10/2012  . Fatty liver 08/10/2012  . HTN, goal below 130/80 09/13/2011  . Ischemic cardiomyopathy 05/27/2011  . Automatic implantable cardiac defibrillator--Medtronic 05/27/2011  . 6949 defibrillator lead 05/27/2011  . FATIGUE 06/05/2009  . VENTRICULAR FIBRILLATION 02/04/2009  . Morbid obesity ([place]) 12/20/2008  . Diabetes mellitus type 2 with complications ([place]) 89/37/3428  . Hyperlipidemia LDL goal <100 08/31/2007  . ANXIETY 08/31/2007  . SLE 08/31/2007  . ARTHRITIS, KNEES, BILATERAL 08/31/2007  . AVASCULAR NECROSIS 08/31/2007  . Insomnia secondary to depression with anxiety 08/31/2007  . MIGRAINES, HX OF 08/31/2007    Past Surgical History:  Procedure Laterality Date  . ABDOMINAL HYSTERECTOMY    . CHOLECYSTECTOMY    . COLONOSCOPY  07/15/2011   SLF: internal hemorrhoids/hyperplastic polyps in the rectum/tubular adenomaSURVEILLANCE Nov 2017  . defibrillator placed   2009 and 05/2013  . ICD GENERATOR CHANGE  06/17/2013   Dr Caryl Comes  . IMPLANTABLE CARDIOVERTER DEFIBRILLATOR (ICD) GENERATOR CHANGE Left 06/17/2013   Procedure: ICD GENERATOR CHANGE;  Surgeon: Deboraha Sprang, MD;  Location:  [facility] CATH LAB;  Service: Cardiovascular;  Laterality: Left;  . LEAD REVISION N/A 06/17/2013   Procedure: LEAD REVISION;  Surgeon: Deboraha Sprang, MD;  Location: [facility] CATH LAB;  Service: Cardiovascular;  Laterality: N/A;  . ROTATOR CUFF REPAIR Right 2002    OB History    No data available       Home Medications    Prior to Admission  medications   Medication Sig Start Date End Date Taking? Authorizing Provider  albuterol (PROVENTIL HFA;VENTOLIN HFA) 108 (90 Base) MCG/ACT inhaler Inhale 2 puffs into the lungs every 6 (six) hours as needed for wheezing or shortness of breath. 03/13/16   Fayrene Helper, MD  ALPRAZolam Duanne Moron) 1 MG tablet TAKE 1 TABLET BY MOUTH AT BEDTIME AS NEEDED FOR ANXIETY. 09/24/16   Fayrene Helper, MD  aspirin 81 MG tablet Take 1 tablet (81 mg total) by mouth daily. 09/26/13   Deboraha Sprang, MD  Aspirin-Acetaminophen (GOODYS BODY PAIN PO) Take 1 Package by mouth daily as needed. For pain    Historical Provider, MD  atorvastatin (LIPITOR) 10 MG tablet Take 1 tablet (10 mg total) by mouth daily. 03/18/16   Fayrene Helper, MD  budesonide-formoterol [facility]) 80-4.5 MCG/ACT inhaler Inhale 2 puffs into the lungs 2 (two) times daily. 04/02/16   Fayrene Helper, MD  carvedilol (COREG) 3.125 MG tablet TAKE (2) TABLETS BY MOUTH TWICE DAILY. 10/20/16   Amber Sena Slate, NP  clopidogrel (PLAVIX) 75 MG tablet Take 1 tablet (75 mg total) by mouth daily. 01/18/16   Deboraha Sprang, MD  ergocalciferol (VITAMIN D2) 50000 units capsule Take 1 capsule (50,000 Units total) by mouth once a week. One capsule once weekly 01/17/16   Fayrene Helper, MD  fenofibrate (TRICOR) 145 MG tablet Take 145 mg by mouth daily.  04/15/13   Historical Provider, MD  furosemide (LASIX) 20 MG tablet Take 2 tablets(40 mg)  by mouth as needed for swelling and edema    Historical Provider, MD  oxyCODONE (ROXICODONE) 15 MG immediate release tablet Take 30 mg by mouth 3 (three) times daily. 04/10/16   Historical Provider, MD    Family History Family History  Problem Relation Age of Onset  . Hepatitis Mother 64    HCV  . Liver cancer Mother 40  . Cancer Mother   . Diabetes Mother   . Heart disease Mother   . Hyperlipidemia Mother   . Stroke Sister 45    x3  . Diabetes Sister   . Hyperlipidemia Sister   . Dementia Father   . HIV Sister   .  Diabetes Brother   . Heart disease Brother   . Hyperlipidemia Brother     Social History Social History  Substance Use Topics  . Smoking status: Current Every Day Smoker    Packs/day: 0.50    Years: 15.00    Types: Cigarettes  . Smokeless tobacco: Never Used  . Alcohol use 0.0 oz/week     Comment: occasionaly     Allergies   Bee venom; Cortisone; and Robaxin [methocarbamol]   Review of Systems Review of Systems  Constitutional: Negative for fever.  Respiratory: Negative for shortness of breath.   Cardiovascular: Negative for chest pain.  Gastrointestinal: Positive for abdominal pain.  Genitourinary: Negative for difficulty urinating and dysuria.  Musculoskeletal: Positive for back pain.  Neurological: Negative for weakness.  All other systems reviewed and are negative.    Physical Exam Updated Vital Signs BP 118/79 (BP Location: Right  Arm)   Pulse 86   Temp 97.9 F (36.6 C) (Oral)   Resp 18   Ht 5\' 6"  (1.676 m)   Wt 95.3 kg   SpO2 100%   BMI 33.89 kg/m   Physical Exam CONSTITUTIONAL: Well developed/well nourished HEAD: Normocephalic/atraumatic EYES: EOMI/PERRL ENMT: Mucous membranes moist NECK: supple no meningeal signs SPINE/BACK:midline lumbar tenderness CV: S1/S2 noted, no murmurs/rubs/gallops noted LUNGS: Lungs are clear to auscultation bilaterally, no apparent distress ABDOMEN: soft, mild lower abdominal tenderness, no rebound or guarding, bowel sounds noted throughout abdomen GU:no cva tenderness NEURO: Pt is awake/alert/appropriate, moves all extremitiesx4.  No facial droop.   EXTREMITIES: pulses normal/equal, full ROM SKIN: warm, color normal PSYCH: no abnormalities of mood noted, alert and oriented to situation   ED Treatments / Results  Labs (all labs ordered are listed, but only abnormal results are displayed) Labs Reviewed  COMPREHENSIVE METABOLIC PANEL - Abnormal; Notable for the following:       Result Value   Potassium 2.8 (*)     Glucose, Bld 163 (*)    All other components within normal limits  LIPASE, BLOOD - Abnormal; Notable for the following:    Lipase 80 (*)    All other components within normal limits  URINALYSIS, ROUTINE W REFLEX MICROSCOPIC - Abnormal; Notable for the following:    Hgb urine dipstick SMALL (*)    Leukocytes, UA TRACE (*)    Bacteria, UA RARE (*)    Squamous Epithelial / LPF 0-5 (*)    All other components within normal limits  CBC WITH DIFFERENTIAL/PLATELET    EKG  EKG Interpretation None       Radiology No results found.  Procedures Procedures (including critical care time)  Medications Ordered in ED Medications  HYDROcodone-acetaminophen (NORCO/VICODIN) 5-325 MG per tablet 2 tablet (2 tablets Oral Given 12/05/16 0427)  ondansetron (ZOFRAN-ODT) disintegrating tablet 8 mg (8 mg Oral Given 12/05/16 0427)  fentaNYL (SUBLIMAZE) injection 100 mcg (100 mcg Intravenous Given 12/05/16 0513)  iopamidol (ISOVUE-300) 61 % injection 100 mL (100 mLs Intravenous Contrast Given 12/05/16 0521)  potassium chloride SA (K-DUR,KLOR-CON) CR tablet 40 mEq (40 mEq Oral Given 12/05/16 0702)     Initial Impression / Assessment and Plan / ED Course  I have reviewed the triage vital signs and the nursing notes.  Pertinent labs   results that were available during my care of the patient were reviewed by me and considered in my medical decision making (see chart for details).     4:58 AM Pt with minimal relief with oral pain meds She appears uncomfortable She has LLQ tenderness Will proceed with CT imaging given age/medical conditions/history 7:11 AM CT scan negative for acute disease Pt now admits these episodes have been ongoing for awhile and PCP is working it up I feel she is appropriate for d/c home  She is noted to be HYPOkalemic She is on lasix without KCL supplement Will start oral potassium Advised PCP followup  Final Clinical Impressions(s) / ED Diagnoses   Final diagnoses:    Generalized abdominal pain  Strain of lumbar region, initial encounter  Hypokalemia    New Prescriptions New Prescriptions   POTASSIUM CHLORIDE SA (K-DUR,KLOR-CON) 20 MEQ TABLET    Take 1 tablet (20 mEq total) by mouth daily.     Ripley Fraise, MD 12/05/16 [place], MD 12/05/16 [phone]

## 2016-12-05 NOTE — ED Triage Notes (Signed)
Pt states that she is having lower back pain that goes to her lower abd.

## 2016-12-15 ENCOUNTER — Ambulatory Visit: Payer: Medicare Other

## 2016-12-15 DIAGNOSIS — Z95810 Presence of automatic (implantable) cardiac defibrillator: Secondary | ICD-10-CM

## 2016-12-15 DIAGNOSIS — I5022 Chronic systolic (congestive) heart failure: Secondary | ICD-10-CM

## 2016-12-15 NOTE — Progress Notes (Signed)
EPIC Encounter for ICM Monitoring  Patient Name: [name] is a 51 y.o. female Date: 12/15/2016 Primary Care Physican: Tula Nakayama, MD Primary Cardiologist: Caryl Comes Electrophysiologist: Caryl Comes Dry Weight:  Does not weigh        1st ICM encounter. Heart Failure questions reviewed, pt asymptomatic.  If she does have symptoms it is usually in her belly area or breathing   Thoracic impedance is at baseline normal today but was abnormal suggesting fluid accumulation from 12/03/2016 to 12/12/2016.  Prescribed dosage: Furosemide 20 mg 2 tablets (40 mg total) as needed.  Potassium was prescribed x 7 tablets at time of ER visit on 4/20 due to low potassium.  Labs: 12/05/2016 Creatinine 0.72, BUN 7,   Potassium 2.8, Sodium 138 09/22/2016 Creatinine 0.80, BUN 10, Potassium 4.3, Sodium 138, EGFR 85->89  04/02/2016 Creatinine 0.61, BUN 10, Potassium 3.3, Sodium 138  03/13/2016 Creatinine 0.81, BUN 3,   Potassium 4.3, Sodium 139, EGFR 84->89  10/10/2015 Creatinine 0.74, BUN 6,   Potassium 3.9, Sodium 142   Recommendations:  Patient reported the Furosemide prescription she has is expired.  She stated she had been taking daily prior to her ER visit because she felt like she had fluid.  Asked if she is took the Potassium tablets the ER physician prescribed and she said she started taking them today.  Advised to complete the prescription and explained the importance of taking the Potassium and how low level can effect the heart.  Advised I would discuss with Dr Caryl Comes regarding if needs a prescription for Potassium to take when she takes Furosemide.   Follow-up plan: ICM clinic phone appointment on 01/15/2017.    Copy of ICM check sent to device physician.   3 month ICM trend: 12/15/2016   1 Year ICM trend:      [name], RN 12/15/2016 10:37 AM

## 2016-12-16 DIAGNOSIS — G894 Chronic pain syndrome: Secondary | ICD-10-CM | POA: Diagnosis not present

## 2016-12-16 DIAGNOSIS — M329 Systemic lupus erythematosus, unspecified: Secondary | ICD-10-CM | POA: Diagnosis not present

## 2016-12-16 DIAGNOSIS — Z79891 Long term (current) use of opiate analgesic: Secondary | ICD-10-CM | POA: Diagnosis not present

## 2016-12-16 DIAGNOSIS — Z79899 Other long term (current) drug therapy: Secondary | ICD-10-CM | POA: Diagnosis not present

## 2016-12-16 MED ORDER — SPIRONOLACTONE 25 MG PO TABS: 12.5000 mg | ORAL_TABLET | Freq: Every day | ORAL | 3 refills | Status: DC

## 2016-12-16 MED ORDER — FUROSEMIDE 20 MG PO TABS: ORAL_TABLET | ORAL | 6 refills | Status: DC

## 2016-12-16 MED ORDER — POTASSIUM CHLORIDE CRYS ER 20 MEQ PO TBCR: EXTENDED_RELEASE_TABLET | ORAL | 6 refills | Status: DC

## 2016-12-16 NOTE — Progress Notes (Signed)
Reviewed with Dr Caryl Comes.  Call back to patient and advised Dr Caryl Comes ordered following prescriptions:  Furosemide 20 mg 2 tablets (40 mg total) as needed for fluid symptoms, Potassium 20 mEq 1 tablet taken on days when Furosemide is taken and Spironolatone12.5 mg daily and BMET in 2 weeks.  Explained medications and when to take.  She agreed to medication and BMET on 12/30/2016.

## 2016-12-30 ENCOUNTER — Other Ambulatory Visit (INDEPENDENT_AMBULATORY_CARE_PROVIDER_SITE_OTHER): Payer: Medicare Other

## 2016-12-30 DIAGNOSIS — I5022 Chronic systolic (congestive) heart failure: Secondary | ICD-10-CM

## 2016-12-31 LAB — BASIC METABOLIC PANEL
BUN / CREAT RATIO: 6 — AB (ref 9–23)
BUN: 4 mg/dL — AB (ref 6–24)
CO2: 22 mmol/L (ref 18–29)
CREATININE: 0.7 mg/dL (ref 0.57–1.00)
Calcium: 9.2 mg/dL (ref 8.7–10.2)
Chloride: 106 mmol/L (ref 96–106)
GFR calc Af Amer: 115 mL/min/{1.73_m2} (ref >59)
GFR calc non Af Amer: 100 mL/min/{1.73_m2} (ref >59)
GLUCOSE: 124 mg/dL — AB (ref 65–99)
Potassium: 4.2 mmol/L (ref 3.5–5.2)
Sodium: 140 mmol/L (ref 134–144)

## 2017-01-15 ENCOUNTER — Ambulatory Visit (INDEPENDENT_AMBULATORY_CARE_PROVIDER_SITE_OTHER): Payer: Medicare Other

## 2017-01-15 DIAGNOSIS — I5022 Chronic systolic (congestive) heart failure: Secondary | ICD-10-CM | POA: Diagnosis not present

## 2017-01-15 DIAGNOSIS — Z95810 Presence of automatic (implantable) cardiac defibrillator: Secondary | ICD-10-CM | POA: Diagnosis not present

## 2017-01-16 NOTE — Progress Notes (Signed)
EPIC Encounter for ICM Monitoring  Patient Name: [name] is a 51 y.o. female Date: 01/16/2017 Primary Care Physican: Fayrene Helper, MD Primary Cardiologist: Caryl Comes Electrophysiologist: Caryl Comes Dry Weight:  Does not weigh      Heart Failure questions reviewed, pt symptomatic with with chest congestion from a cold.   Thoracic impedance abnormal suggesting fluid accumulation since 01/07/2017.  Prescribed dosage: Furosemide 20 mg 2 tablets (40 mg total) as needed. Potassium 20 mEq 1 tablet (20 mEq total) by mouth on days when Furosemide is taken. Spironolactone 25 mg 0.5 tablet (12.5 mg total) daily  Labs: 12/30/2016 Creatinine 0.70, BUN 4,   Potassium 4.2, Sodium 140, EGFR 100-115 12/05/2016 Creatinine 0.72, BUN 7,   Potassium 2.8, Sodium 138 09/22/2016 Creatinine 0.80, BUN 10, Potassium 4.3, Sodium 138, EGFR 85->89  04/02/2016 Creatinine 0.61, BUN 10, Potassium 3.3, Sodium 138  03/13/2016 Creatinine 0.81, BUN 3,   Potassium 4.3, Sodium 139, EGFR 84->89  10/10/2015 Creatinine 0.74, BUN 6,   Potassium 3.9, Sodium 142   Recommendations: Advised to take PRN Furosemide 40 mg x 2 days with Potassium 20 mEq and after 2nd day return to PRN.  She verbalized understanding.   Follow-up plan: ICM clinic phone appointment on 01/20/2017 to recheck fluid levels.   Copy of ICM check sent to device physician.   3 month ICM trend: 01/15/2017   1 Year ICM trend:      [name], RN 01/16/2017 10:50 AM

## 2017-01-20 ENCOUNTER — Ambulatory Visit (INDEPENDENT_AMBULATORY_CARE_PROVIDER_SITE_OTHER): Payer: Medicare Other

## 2017-01-20 ENCOUNTER — Telehealth: Payer: Self-pay | Admitting: Cardiology

## 2017-01-20 DIAGNOSIS — Z95810 Presence of automatic (implantable) cardiac defibrillator: Secondary | ICD-10-CM

## 2017-01-20 DIAGNOSIS — I5022 Chronic systolic (congestive) heart failure: Secondary | ICD-10-CM

## 2017-01-20 NOTE — Telephone Encounter (Signed)
Spoke with pt and reminded pt of remote transmission that is due today. Pt verbalized understanding.   

## 2017-01-20 NOTE — Progress Notes (Signed)
EPIC Encounter for ICM Monitoring  Patient Name: [name] is a 51 y.o. female Date: 01/20/2017 Primary Care Physican: Fayrene Helper, MD Primary Cardiologist: Caryl Comes Electrophysiologist: Faustino Congress Weight:Does not weigh       Heart Failure questions reviewed, pt asymptomatic.   Thoracic impedance returned to normal after taking PRN Furosemide x 2 days.    Prescribed dosage: Furosemide 20 mg 2 tablets (40 mg total) as needed. Potassium 20 mEq 1 tablet (20 mEq total) by mouth on days when Furosemide is taken. Spironolactone 25 mg 0.5 tablet (12.5 mg total) daily  Recommendations: No changes.  Encouraged to call for fluid symptoms.  Follow-up plan: ICM clinic phone appointment on 02/16/2017.    Copy of ICM check sent to device physician.   3 month ICM trend: 01/20/2017   1 Year ICM trend:      [name], RN 01/20/2017 2:19 PM

## 2017-01-22 ENCOUNTER — Other Ambulatory Visit: Payer: Self-pay | Admitting: Internal Medicine

## 2017-02-16 ENCOUNTER — Ambulatory Visit (INDEPENDENT_AMBULATORY_CARE_PROVIDER_SITE_OTHER): Payer: Medicare Other | Admitting: *Deleted

## 2017-02-16 ENCOUNTER — Telehealth: Payer: Self-pay

## 2017-02-16 DIAGNOSIS — I255 Ischemic cardiomyopathy: Secondary | ICD-10-CM | POA: Diagnosis not present

## 2017-02-16 NOTE — Telephone Encounter (Signed)
Remote ICM transmission received.  Attempted patient call and left message to return call.   

## 2017-02-16 NOTE — Progress Notes (Addendum)
EPIC Encounter for ICM Monitoring  Patient Name: [name] is a 51 y.o. female Date: 02/16/2017 Primary Care Physican: Fayrene Helper, MD Primary Cardiologist: Caryl Comes Electrophysiologist: Faustino Congress Weight:Does not weigh     Clinical Status Since 20-Jan-2017 Treated VF 0 FVT (Off) VT 0 Monitored VT (130-200 bpm) 39 VT-NS (>4 beats, >200 bpm) 2 High Rate-NS 0 SVT: VT/VF Rx Withheld 0 V. Oversensing-TWave Rx Withheld 0 V. Oversensing-Noise Rx Withheld 0   39 monitored VT episodes, longest was 16 min.     Patient denied any fluid symptoms.  She stated she has been having abdominal pain and headache after taking Spironolactone.  She has only been having these symptoms since starting the medication.  Advised will discuss with Dr Caryl Comes and call her back. Spironolactone started in April because of Hypokalemia that was discovered when she had an ER visit.   Thoracic impedance abnormal suggesting fluid accumulation since 01/24/2017.    Prescribed dosage: Furosemide 20 mg 2 tablets (40 mg total) as needed. Potassium 20 mEq 1 tablet (20 mEq total) by mouth on days when Furosemide is taken. Spironolactone 25 mg 0.5 tablet (12.5 mg total) daily  Labs: 12/30/2016 Creatinine 0.70, BUN 4, Potassium 4.2, Sodium 140, EGFR 100-115 12/05/2016 Creatinine 0.72, BUN 7, Potassium 2.8, Sodium 138, EGFR >60  09/22/2016 Creatinine 0.80, BUN 10, Potassium 4.3, Sodium 138   Recommendations: Advised would discuss Spironolactone with Dr Caryl Comes   Follow-up plan: ICM clinic phone appointment on 02/24/2017 to recheck fluid levels.    Copy of ICM check sent to device physician.   3 month ICM trend: 02/16/2017   1 Year ICM trend:      [name], RN 02/16/2017 3:04 PM

## 2017-02-16 NOTE — Progress Notes (Signed)
Remote ICD transmission.   

## 2017-02-17 ENCOUNTER — Encounter: Payer: Self-pay | Admitting: Cardiology

## 2017-02-17 LAB — CUP PACEART REMOTE DEVICE CHECK
Battery Remaining Longevity: 109 mo
Brady Statistic RV Percent Paced: 0.01 %
HighPow Impedance: 68 Ohm
Implantable Lead Implant Date: 20141031
Implantable Lead Location: 753860
Implantable Lead Model: 6935
Lead Channel Impedance Value: 475 Ohm
Lead Channel Sensing Intrinsic Amplitude: 7.5 mV
Lead Channel Setting Pacing Amplitude: 2.5 V
Lead Channel Setting Pacing Pulse Width: 0.4 ms
Lead Channel Setting Sensing Sensitivity: 0.3 mV
MDC IDC MSMT BATTERY VOLTAGE: 3.01 V
MDC IDC MSMT LEADCHNL RV IMPEDANCE VALUE: 399 Ohm
MDC IDC MSMT LEADCHNL RV PACING THRESHOLD AMPLITUDE: 0.5 V
MDC IDC MSMT LEADCHNL RV PACING THRESHOLD PULSEWIDTH: 0.4 ms
MDC IDC MSMT LEADCHNL RV SENSING INTR AMPL: 7.5 mV
MDC IDC PG IMPLANT DT: 20141031
MDC IDC SESS DTM: 20180702073323

## 2017-02-17 NOTE — Addendum Note (Signed)
Addended by: [name] on: 02/17/2017 02:13 PM   Modules accepted: Orders

## 2017-02-17 NOTE — Progress Notes (Addendum)
Discussed spironolactone with Dr Caryl Comes.  Call to patient and advised Dr Caryl Comes instructed her to stop Spironolactone due to headaches and abdominal.  I will follow up with her in 2 weeks to check if the symptoms have resolved.  She verbalized understanding.  Next ICM follow up 03/03/2017.

## 2017-02-23 DIAGNOSIS — E785 Hyperlipidemia, unspecified: Secondary | ICD-10-CM | POA: Diagnosis not present

## 2017-02-23 DIAGNOSIS — E559 Vitamin D deficiency, unspecified: Secondary | ICD-10-CM | POA: Diagnosis not present

## 2017-02-23 DIAGNOSIS — I10 Essential (primary) hypertension: Secondary | ICD-10-CM | POA: Diagnosis not present

## 2017-02-23 DIAGNOSIS — E118 Type 2 diabetes mellitus with unspecified complications: Secondary | ICD-10-CM | POA: Diagnosis not present

## 2017-02-24 ENCOUNTER — Ambulatory Visit: Payer: Medicare Other | Admitting: Family Medicine

## 2017-02-24 LAB — LIPID PANEL
CHOL/HDL RATIO: 4.5 ratio (ref <5.0)
Cholesterol: 147 mg/dL (ref <200)
HDL: 33 mg/dL — AB (ref >50)
LDL CALC: 97 mg/dL (ref <100)
TRIGLYCERIDES: 87 mg/dL (ref <150)
VLDL: 17 mg/dL (ref <30)

## 2017-02-24 LAB — VITAMIN D 25 HYDROXY (VIT D DEFICIENCY, FRACTURES): VIT D 25 HYDROXY: 12 ng/mL — AB (ref 30–100)

## 2017-02-24 LAB — HEMOGLOBIN A1C
Hgb A1c MFr Bld: 6 % — ABNORMAL HIGH (ref <5.7)
Mean Plasma Glucose: 126 mg/dL

## 2017-02-24 LAB — COMPLETE METABOLIC PANEL WITH GFR
ALBUMIN: 3.7 g/dL (ref 3.6–5.1)
ALK PHOS: 87 U/L (ref 33–130)
ALT: 16 U/L (ref 6–29)
AST: 17 U/L (ref 10–35)
BUN: 8 mg/dL (ref 7–25)
CALCIUM: 8.7 mg/dL (ref 8.6–10.4)
CO2: 25 mmol/L (ref 20–31)
CREATININE: 0.72 mg/dL (ref 0.50–1.05)
Chloride: 109 mmol/L (ref 98–110)
GFR, Est Non African American: >89 mL/min (ref >=60)
Glucose, Bld: 99 mg/dL (ref 65–99)
Potassium: 4.6 mmol/L (ref 3.5–5.3)
Sodium: 141 mmol/L (ref 135–146)
Total Bilirubin: 0.7 mg/dL (ref 0.2–1.2)
Total Protein: 6.9 g/dL (ref 6.1–8.1)

## 2017-02-27 ENCOUNTER — Encounter (HOSPITAL_COMMUNITY): Payer: Self-pay

## 2017-02-27 ENCOUNTER — Emergency Department (HOSPITAL_COMMUNITY): Payer: Medicare Other

## 2017-02-27 DIAGNOSIS — R079 Chest pain, unspecified: Secondary | ICD-10-CM | POA: Diagnosis not present

## 2017-02-27 DIAGNOSIS — Z5321 Procedure and treatment not carried out due to patient leaving prior to being seen by health care provider: Secondary | ICD-10-CM | POA: Diagnosis not present

## 2017-02-27 LAB — CBC
HEMATOCRIT: 39.8 % (ref 36.0–46.0)
HEMOGLOBIN: 13.1 g/dL (ref 12.0–15.0)
MCH: 27.1 pg (ref 26.0–34.0)
MCHC: 32.9 g/dL (ref 30.0–36.0)
MCV: 82.2 fL (ref 78.0–100.0)
Platelets: 273 10*3/uL (ref 150–400)
RBC: 4.84 MIL/uL (ref 3.87–5.11)
RDW: 13.7 % (ref 11.5–15.5)
WBC: 6.2 10*3/uL (ref 4.0–10.5)

## 2017-02-27 LAB — I-STAT TROPONIN, ED: Troponin i, poc: 0 ng/mL (ref 0.00–0.08)

## 2017-02-27 LAB — BASIC METABOLIC PANEL
ANION GAP: 11 (ref 5–15)
BUN: 7 mg/dL (ref 6–20)
CHLORIDE: 102 mmol/L (ref 101–111)
CO2: 23 mmol/L (ref 22–32)
Calcium: 9 mg/dL (ref 8.9–10.3)
Creatinine, Ser: 0.69 mg/dL (ref 0.44–1.00)
GFR calc Af Amer: >60 mL/min (ref >60)
GFR calc non Af Amer: >60 mL/min (ref >60)
GLUCOSE: 111 mg/dL — AB (ref 65–99)
POTASSIUM: 2.9 mmol/L — AB (ref 3.5–5.1)
Sodium: 136 mmol/L (ref 135–145)

## 2017-02-27 NOTE — ED Triage Notes (Signed)
Pt states that she started having pain at her pacemaker site today when she raises her L arm. She has pain on the R side of her chest when eating or drinking that began last night. Pt is afraid something is lose on her pacemaker.

## 2017-02-28 ENCOUNTER — Emergency Department (HOSPITAL_COMMUNITY)
Admission: EM | Admit: 2017-02-28 | Discharge: 2017-02-28 | Disposition: A | Discharge status: Home / Self Care | Payer: Medicare Other | Attending: Emergency Medicine | Admitting: Emergency Medicine

## 2017-02-28 LAB — I-STAT TROPONIN, ED: Troponin i, poc: 0 ng/mL (ref 0.00–0.08)

## 2017-02-28 NOTE — ED Notes (Signed)
Pt states that she does not wish to stay, states she will go see her doctor on Monday

## 2017-03-02 ENCOUNTER — Ambulatory Visit (INDEPENDENT_AMBULATORY_CARE_PROVIDER_SITE_OTHER): Payer: Medicare Other | Admitting: Family Medicine

## 2017-03-02 ENCOUNTER — Encounter: Payer: Self-pay | Admitting: Family Medicine

## 2017-03-02 ENCOUNTER — Telehealth: Payer: Self-pay

## 2017-03-02 VITALS — BP 118/80 | HR 80 | Resp 16 | Ht 66.0 in | Wt 212.0 lb

## 2017-03-02 DIAGNOSIS — E785 Hyperlipidemia, unspecified: Secondary | ICD-10-CM | POA: Diagnosis not present

## 2017-03-02 DIAGNOSIS — F17210 Nicotine dependence, cigarettes, uncomplicated: Secondary | ICD-10-CM

## 2017-03-02 DIAGNOSIS — F418 Other specified anxiety disorders: Secondary | ICD-10-CM | POA: Diagnosis not present

## 2017-03-02 DIAGNOSIS — Z09 Encounter for follow-up examination after completed treatment for conditions other than malignant neoplasm: Secondary | ICD-10-CM

## 2017-03-02 DIAGNOSIS — F5105 Insomnia due to other mental disorder: Secondary | ICD-10-CM

## 2017-03-02 DIAGNOSIS — F419 Anxiety disorder, unspecified: Secondary | ICD-10-CM | POA: Diagnosis not present

## 2017-03-02 DIAGNOSIS — F17218 Nicotine dependence, cigarettes, with other nicotine-induced disorders: Secondary | ICD-10-CM | POA: Diagnosis not present

## 2017-03-02 DIAGNOSIS — I10 Essential (primary) hypertension: Secondary | ICD-10-CM | POA: Diagnosis not present

## 2017-03-02 DIAGNOSIS — E8881 Metabolic syndrome: Secondary | ICD-10-CM | POA: Diagnosis not present

## 2017-03-02 DIAGNOSIS — M62830 Muscle spasm of back: Secondary | ICD-10-CM

## 2017-03-02 DIAGNOSIS — E876 Hypokalemia: Secondary | ICD-10-CM

## 2017-03-02 MED ORDER — ALPRAZOLAM 1 MG PO TABS: ORAL_TABLET | ORAL | 3 refills | Status: DC

## 2017-03-02 MED ORDER — CYCLOBENZAPRINE HCL 5 MG PO TABS: ORAL_TABLET | ORAL | 0 refills | Status: DC

## 2017-03-02 NOTE — Telephone Encounter (Signed)
This message was intended for Device triage and rerouted message

## 2017-03-02 NOTE — Telephone Encounter (Signed)
Patient returning your call.

## 2017-03-02 NOTE — Telephone Encounter (Signed)
Returned patient call as requested by voice mail message.  Patient reported she went to ER this weekend due to pain at defibrillator site every time she raises her left arm.  She feels like something is being pulled.  Site is sore to touch.  She denied any open areas of the skin, redness, swelling, or fever. After 6 hours of waiting at ER she left so she is unsure what the x-ray showed or the blood work.  Advised would notify device triage and one of the device nurses will call her back.

## 2017-03-02 NOTE — Telephone Encounter (Signed)
Spoke to Trinidad Curet, RN she advised that this should be sent to Dr. Caryl Comes and Nira Conn. Will forward to them for review.

## 2017-03-02 NOTE — Patient Instructions (Addendum)
F/u in 4.5 months, call if you need me before  Bad cholesterol is too high I will ask Dr Jens Som to address  This when he sees you this week, as I am uncertain of your current medication dose  Pls bring all meds to next visit  Please cut back to 8 cigarettes daily starting this week, quitting  Is best f or all of [place] , you know your situation!!  Xanax will be refilled  Short course of muscle relaxer is sent   Chem 7 non fast tomorrow            Steps to Quit Smoking Smoking tobacco can be bad for your health. It can also affect almost every organ in your body. Smoking puts you and people around you at risk for many serious long-lasting (chronic) diseases. Quitting smoking is hard, but it is one of the best things that you can do for your health. It is never too late to quit. What are the benefits of quitting smoking? When you quit smoking, you lower your risk for getting serious diseases and conditions. They can include:  Lung cancer or lung disease.  Heart disease.  Stroke.  Heart attack.  Not being able to have children (infertility).  Weak bones (osteoporosis) and broken bones (fractures).  If you have coughing, wheezing, and shortness of breath, those symptoms may get better when you quit. You may also get sick less often. If you are pregnant, quitting smoking can help to lower your chances of having a baby of low birth weight. What can I do to help me quit smoking? Talk with your doctor about what can help you quit smoking. Some things you can do (strategies) include:  Quitting smoking totally, instead of slowly cutting back how much you smoke over a period of time.  Going to in-person counseling. You are more likely to quit if you go to many counseling sessions.  Using resources and support systems, such as: ? Database administrator with a Social worker. ? Phone quitlines. ? Careers information officer. ? Support groups or group counseling. ? Text messaging  programs. ? Mobile phone apps or applications.  Taking medicines. Some of these medicines may have nicotine in them. If you are pregnant or breastfeeding, do not take any medicines to quit smoking unless your doctor says it is okay. Talk with your doctor about counseling or other things that can help you.  Talk with your doctor about using more than one strategy at the same time, such as taking medicines while you are also going to in-person counseling. This can help make quitting easier. What things can I do to make it easier to quit? Quitting smoking might feel very hard at first, but there is a lot that you can do to make it easier. Take these steps:  Talk to your family and friends. Ask them to support and encourage you.  Call phone quitlines, reach out to support groups, or work with a Social worker.  Ask people who smoke to not smoke around you.  Avoid places that make you want (trigger) to smoke, such as: ? Bars. ? Parties. ? Smoke-break areas at work.  Spend time with people who do not smoke.  Lower the stress in your life. Stress can make you want to smoke. Try these things to help your stress: ? Getting regular exercise. ? Deep-breathing exercises. ? Yoga. ? Meditating. ? Doing a body scan. To do this, close your eyes, focus on one area of your body at  a time from head to toe, and notice which parts of your body are tense. Try to relax the muscles in those areas.  Download or buy apps on your mobile phone or tablet that can help you stick to your quit plan. There are many free apps, such as QuitGuide from the [employer] Office manager for Disease Control and Prevention). You can find more support from smokefree.gov and other websites.  This information is not intended to replace advice given to you by your health care provider. Make sure you discuss any questions you have with your health care provider. Document Released: 05/31/2009 Document Revised: 04/01/2016 Document Reviewed:  12/19/2014 Elsevier Interactive Patient Education  2018 [employer].

## 2017-03-02 NOTE — Telephone Encounter (Signed)
Spoke with patient regarding ICD site discomfort.  Patient denies swelling, drainage, redness.  She has "hot flashes and chills", but attributes this to menopause.  Afebrile while at the ED over the weekend.  She reports that she feels "pulling" at the site when she raises her left arm.  Advised that this is not abnormal, but as patient is already coming in to the office on Wednesday, 7/18 for repeat BMET, will plan to see her in [facility] to check ICD site at this time.  Patient is agreeable to appointment at 2:00pm.    Patient also notes R-sided chest discomfort when she eats or drinks.  Advised patient to f/u with PCP at appointment this afternoon regarding this concern.  She is agreeable to this plan and is aware to call in the interim with any questions/concerns.

## 2017-03-02 NOTE — Telephone Encounter (Signed)
LMOVM requesting call back to the [facility].  Gave direct phone number.  Will plan to discuss ICD site discomfort.  Call routed to triage for recommendations regarding lab results--K+ 2.9 mmol/L on 02/27/17.  Patient is scheduled for OV at PCP office today at 3:40pm.

## 2017-03-02 NOTE — Telephone Encounter (Signed)
PT WILL HAVE REPEAT BMET DONE ON WED ENCOURAGED TO INCREASE  POTASSIUM RICH FOODS .ALSO  WANT TO SEE DR Caryl Comes PER  PT  FEELS LIKE  WIRES ARE PULLING WHEN RAISING ARMS WILL FORWARD TO  DR KLEIN./CY

## 2017-03-02 NOTE — Telephone Encounter (Signed)
We should prob look at the site thnks   prob also get [facility] but lets look first

## 2017-03-03 ENCOUNTER — Encounter: Payer: Self-pay | Admitting: Cardiology

## 2017-03-03 ENCOUNTER — Ambulatory Visit (INDEPENDENT_AMBULATORY_CARE_PROVIDER_SITE_OTHER): Payer: Self-pay

## 2017-03-03 ENCOUNTER — Other Ambulatory Visit: Payer: Self-pay | Admitting: Family Medicine

## 2017-03-03 DIAGNOSIS — I5022 Chronic systolic (congestive) heart failure: Secondary | ICD-10-CM

## 2017-03-03 DIAGNOSIS — Z95810 Presence of automatic (implantable) cardiac defibrillator: Secondary | ICD-10-CM

## 2017-03-03 NOTE — Progress Notes (Signed)
EPIC Encounter for ICM Monitoring  Patient Name: [name] is a 51 y.o. female Date: 03/03/2017 Primary Care Physican: Fayrene Helper, MD Primary Cardiologist: Caryl Comes Electrophysiologist: Faustino Congress Weight:Does not weigh      Heart Failure questions reviewed, pt asymptomatic for fluid symptoms.  Patient called ICM clinic 7/16 to report she was at the ER on 02/28/2017 because she has been experiencing pain when raising her arm and soreness at the device site.        She was tired of waiting and left ER after 6 hours before receiving any results of labs or chest x-ray.  ER labs showed low potassium and patient was seen by PCP on 03/02/2017.   Patient has device clinic appointment 7/18 with labs.         She asked if she should get the labs drawn today that the PCP order and explained she should go today to recheck potassium levels.  Spironolactone was stopped on 02/16/2017 due to patients complaints of headaches which       have resolved since she stopped the medication. Spironolactone was originally started at ER visit for abdominal pain in 12/05/16 and ER lab results showed Hypokalemia.   Thoracic impedance abnormal suggesting fluid accumulation from 02/20/2017 to 02/25/2017 and starting 02/27/2017.  Prescribed dosage: Furosemide 20 mg 2 tablets (40 mg total) as needed. Potassium 20 mEq 1 tablet (20 mEq total) by mouth on days when Furosemide is taken.  Patient reported she takes Furosemide prn when she is told she needs to take it but takes Potassium 20 mEq daily instead of prn.   Labs: 02/27/2017 Creatinine 0.69, BUN 7,   Potassium 2.9, Sodium 136, EGFR >60 (drawn in ER) 02/23/2017 Creatinine 0.72, BUN 8,   Potassium 4.6, Sodium 141, EGFR >60 12/30/2016 Creatinine 0.70, BUN 4,   Potassium 4.2, Sodium 140, EGFR 100-115 12/05/2016 Creatinine 0.72, BUN 7,   Potassium 2.8, Sodium 138, EGFR >60  09/22/2016 Creatinine 0.80, BUN 10, Potassium 4.3, Sodium 138   Recommendations: Will review  with Dr Caryl Comes  Follow-up plan: ICM clinic phone appointment on 03/23/2017.  Device clinic appointment 03/04/2017.  Copy of ICM check sent to device physician.    3 month ICM trend: 03/03/2017   1 Year ICM trend:      [name], RN 03/03/2017 8:53 AM

## 2017-03-04 ENCOUNTER — Other Ambulatory Visit: Payer: Self-pay | Admitting: Internal Medicine

## 2017-03-04 ENCOUNTER — Encounter: Payer: Self-pay | Admitting: Family Medicine

## 2017-03-04 ENCOUNTER — Other Ambulatory Visit: Payer: Medicare Other

## 2017-03-04 ENCOUNTER — Ambulatory Visit (INDEPENDENT_AMBULATORY_CARE_PROVIDER_SITE_OTHER): Payer: Medicare Other | Admitting: *Deleted

## 2017-03-04 VITALS — BP 108/78 | HR 86 | Temp 97.5°F | Resp 14

## 2017-03-04 DIAGNOSIS — Z09 Encounter for follow-up examination after completed treatment for conditions other than malignant neoplasm: Secondary | ICD-10-CM | POA: Insufficient documentation

## 2017-03-04 DIAGNOSIS — I472 Ventricular tachycardia, unspecified: Secondary | ICD-10-CM

## 2017-03-04 DIAGNOSIS — T827XXA Infection and inflammatory reaction due to other cardiac and vascular devices, implants and grafts, initial encounter: Secondary | ICD-10-CM | POA: Diagnosis not present

## 2017-03-04 DIAGNOSIS — E876 Hypokalemia: Secondary | ICD-10-CM

## 2017-03-04 DIAGNOSIS — Z95810 Presence of automatic (implantable) cardiac defibrillator: Secondary | ICD-10-CM | POA: Diagnosis not present

## 2017-03-04 DIAGNOSIS — M62830 Muscle spasm of back: Secondary | ICD-10-CM | POA: Insufficient documentation

## 2017-03-04 LAB — COMPLETE METABOLIC PANEL WITH GFR
ALT: 15 U/L (ref 6–29)
AST: 17 U/L (ref 10–35)
Albumin: 3.8 g/dL (ref 3.6–5.1)
Alkaline Phosphatase: 93 U/L (ref 33–130)
BILIRUBIN TOTAL: 0.4 mg/dL (ref 0.2–1.2)
BUN: 7 mg/dL (ref 7–25)
CO2: 21 mmol/L (ref 20–31)
Calcium: 8.9 mg/dL (ref 8.6–10.4)
Chloride: 108 mmol/L (ref 98–110)
Creat: 0.68 mg/dL (ref 0.50–1.05)
GFR, Est African American: >89 mL/min (ref >=60)
GFR, Est Non African American: >89 mL/min (ref >=60)
GLUCOSE: 104 mg/dL — AB (ref 65–99)
POTASSIUM: 4.3 mmol/L (ref 3.5–5.3)
SODIUM: 139 mmol/L (ref 135–146)
Total Protein: 7.2 g/dL (ref 6.1–8.1)

## 2017-03-04 LAB — CUP PACEART INCLINIC DEVICE CHECK
Brady Statistic RV Percent Paced: 0.01 %
Date Time Interrogation Session: 20180718170936
HIGH POWER IMPEDANCE MEASURED VALUE: 81 Ohm
Implantable Lead Model: 6935
Lead Channel Impedance Value: 361 Ohm
Lead Channel Impedance Value: 456 Ohm
Lead Channel Pacing Threshold Amplitude: 0.5 V
Lead Channel Sensing Intrinsic Amplitude: 8 mV
Lead Channel Setting Pacing Amplitude: 2.5 V
MDC IDC LEAD IMPLANT DT: 20141031
MDC IDC LEAD LOCATION: 753860
MDC IDC MSMT BATTERY REMAINING LONGEVITY: 109 mo
MDC IDC MSMT BATTERY VOLTAGE: 3 V
MDC IDC MSMT LEADCHNL RV PACING THRESHOLD PULSEWIDTH: 0.4 ms
MDC IDC PG IMPLANT DT: 20141031
MDC IDC SET LEADCHNL RV PACING PULSEWIDTH: 0.4 ms
MDC IDC SET LEADCHNL RV SENSING SENSITIVITY: 0.3 mV

## 2017-03-04 NOTE — Progress Notes (Signed)
[name]     MRN: [id number]      DOB: 01/23/1966   HPI [name] is here for follow up and re-evaluation of chronic medical conditions, medication management and review of any available recent lab and radiology data.  Preventive health is updated, specifically  Cancer screening and Immunization.   Seen in the ED this past weekend for left anterior chest discomfort and arm pain when she raises her arm, as though her pacemaker equipment is becoming "displaced"Only abnormality is low potassium which is new. Has cardiology f/u this week as a result Requests muscle relaxant for spasm The PT denies any adverse reactions to current medications since the last visit.    ROS See HPI  Denies recent fever or chills. Denies sinus pressure, nasal congestion, ear pain or sore throat. Denies chest congestion, productive cough or wheezing.  Denies abdominal pain, nausea, vomiting,diarrhea or constipation.   Denies dysuria, frequency, hesitancy or incontinence. . Denies headaches, seizures, numbness, or tingling. Denies depression,  Uncontrolled anxiety or insomnia. Denies skin break down or rash.   PE  BP 118/80   Pulse 80   Resp 16   Ht 5\' 6"  (1.676 m)   Wt 212 lb (96.2 kg)   SpO2 96%   BMI 34.22 kg/m   Patient alert and oriented and in no cardiopulmonary distress.  HEENT: No facial asymmetry, EOMI,   oropharynx pink and moist.  Neck decreased ROM with mild left trapezius spasmle no JVD, no mass.  Chest: Clear to auscultation bilaterally.Decreased air entry  CVS: S1, S2 no murmurs, no S3.Regular rate.  ABD: Soft non tender.   Ext: No edema  MS: Adequate ROM spine, shoulders, hips and knees.  Skin: Intact, no ulcerations or rash noted.  Psych: Good eye contact, normal affect. Memory intact not anxious or depressed appearing.  CNS: CN 2-12 intact, power,  normal throughout.no focal deficits noted.   Assessment & Plan  Nicotine dependence Patient is asked and   confirms current  Nicotine use.  Five to seven minutes of time is spent in counseling the patient of the need to quit smoking  Advice to quit is delivered clearly specifically in reducing the risk of developing heart disease, having a stroke, or of developing all types of cancer, especially lung and oral cancer. Improvement in breathing and exercise tolerance and quality of life is also discussed, as is the economic benefit.  Assessment of willingness to quit or to make an attempt to quit is made and documented  Assistance in quit attempt is made with several and varied options presented, based on patient's desire and need. These include  literature, local classes available, 1800 QUIT NOW number, OTC and prescription medication.  The GOAL to be NICOTINE FREE is re emphasized.  The patient has set a personal goal of either reduction or discontinuation and follow up is arranged between 6 an 16 weeks.    Hyperlipidemia LDL goal <100 Not at goal, unfortunately unable to adjust med due to uncertainty of dose,  But has card f/u this week Hyperlipidemia:Low fat diet discussed and encouraged.   Lipid Panel  Lab Results  Component Value Date   CHOL 147 02/23/2017   HDL 33 (L) 02/23/2017   LDLCALC 97 02/23/2017   TRIG 87 02/23/2017   CHOLHDL 4.5 02/23/2017       Insomnia secondary to depression with anxiety Sleep hygiene reviewed and written information offered also. Prescription sent for  medication needed.   HTN, goal below 130/80  Controlled, no change in medication DASH diet and commitment to daily physical activity for a minimum of 30 minutes discussed and encouraged, as a part of hypertension management. The importance of attaining a healthy weight is also discussed.  BP/Weight 03/02/2017 02/28/2017 12/05/2016 11/14/2016 10/24/2016 09/24/2016 3/95/3202  Systolic BP 334 356 861 683 729 021 115  Diastolic BP 80 72 79 72 76 78 72  Wt. (Lbs) 212 - 210 213 217 216 216.04  BMI 34.22 -  33.89 30.56 35.02 34.86 34.87       Muscle spasm of back Upper back and neck spasm short term course of flexeril prescribed  Encounter for examination following treatment at hospital Recently in Ed for chest and upper neck spasm, improved, but still mildly symptomatic with documented hypokalemia, rept lab , short term flexeril and cardiology f/u later this week  Morbid obesity Deteriorated. Patient re-educated about  the importance of commitment to a  minimum of 150 minutes of exercise per week.  The importance of healthy food choices with portion control discussed. Encouraged to start a food diary, count calories and to consider  joining a support group. Sample diet sheets offered. Goals set by the patient for the next several months.   Weight /BMI 03/02/2017 12/05/2016 11/14/2016  WEIGHT 212 lb 210 lb 213 lb  HEIGHT 5\' 6"  5\' 6"  5\' 10"   BMI 34.22 kg/m2 33.89 kg/m2 30.56 kg/m2

## 2017-03-04 NOTE — Assessment & Plan Note (Signed)
Sleep hygiene reviewed and written information offered also. Prescription sent for  medication needed.  

## 2017-03-04 NOTE — Assessment & Plan Note (Signed)
Recently in Ed for chest and upper neck spasm, improved, but still mildly symptomatic with documented hypokalemia, rept lab , short term flexeril and cardiology f/u later this week

## 2017-03-04 NOTE — Progress Notes (Signed)
labs

## 2017-03-04 NOTE — Assessment & Plan Note (Signed)

## 2017-03-04 NOTE — Progress Notes (Signed)
ICD check in clinic, site check for pain at device site. Site appears well healed; no drainage, swelling, redness, fever, or chills noted. Dr. Caryl Comes saw patient in office today. Site tender to palpation per patient, blood cultures ordered x2 per Dr. Caryl Comes. K+ also unstable recently, BMET and Mg ordered per Dr. Caryl Comes.  Normal device function. Thresholds and sensing consistent with previous device measurements. Impedance trends stable over time. 52 monitored VT episodes and 6 NSVT episodes, increased frequency since mid-June, longest 76min, possibly electrolyte-related per Dr. Caryl Comes. no programming changes today. Histogram distribution appropriate for patient and level of activity. Thoracic impedance trend unstable recently, patient asymptomatic at this time, followed by ICM clinic, instructed to reduce sodium intake but hold off on PRN furosemide pending lab results. Device programmed at appropriate safety margins; max RV pacing lead impedance increased to 2000ohms per protocol. Device programmed to optimize intrinsic conduction. Estimated longevity 9.0 years. Pt enrolled in remote follow-up. Patient education completed including shock plan. Alert tones demonstrated for patient. ROV with SK on 05/15/17,    Patient instructed to proceed to [place] immediately after this appointment for blood cultures/BMET/Mg.  Patient verbalizes understanding of instructions and denies additional questions or concerns at this time.

## 2017-03-04 NOTE — Assessment & Plan Note (Signed)
Controlled, no change in medication DASH diet and commitment to daily physical activity for a minimum of 30 minutes discussed and encouraged, as a part of hypertension management. The importance of attaining a healthy weight is also discussed.  BP/Weight 03/02/2017 02/28/2017 12/05/2016 11/14/2016 10/24/2016 09/24/2016 11/09/4980  Systolic BP 641 583 094 076 808 811 031  Diastolic BP 80 72 79 72 76 78 72  Wt. (Lbs) 212 - 210 213 217 216 216.04  BMI 34.22 - 33.89 30.56 35.02 34.86 34.87

## 2017-03-04 NOTE — Assessment & Plan Note (Signed)
Upper back and neck spasm short term course of flexeril prescribed

## 2017-03-04 NOTE — Assessment & Plan Note (Signed)
Not at goal, unfortunately unable to adjust med due to uncertainty of dose,  But has card f/u this week Hyperlipidemia:Low fat diet discussed and encouraged.   Lipid Panel  Lab Results  Component Value Date   CHOL 147 02/23/2017   HDL 33 (L) 02/23/2017   LDLCALC 97 02/23/2017   TRIG 87 02/23/2017   CHOLHDL 4.5 02/23/2017

## 2017-03-04 NOTE — Assessment & Plan Note (Signed)
Deteriorated. Patient re-educated about  the importance of commitment to a  minimum of 150 minutes of exercise per week.  The importance of healthy food choices with portion control discussed. Encouraged to start a food diary, count calories and to consider  joining a support group. Sample diet sheets offered. Goals set by the patient for the next several months.   Weight /BMI 03/02/2017 12/05/2016 11/14/2016  WEIGHT 212 lb 210 lb 213 lb  HEIGHT 5\' 6"  5\' 6"  5\' 10"   BMI 34.22 kg/m2 33.89 kg/m2 30.56 kg/m2

## 2017-03-06 NOTE — Progress Notes (Signed)
Patient seen by Dr Caryl Comes during device clinic on 03/04/2017.  Patient did not get labs as instructed by Dr Caryl Comes on 03/04/2017.  Next ICM remote transmission scheduled for 03/23/2017.

## 2017-03-10 LAB — MAGNESIUM: MAGNESIUM: 2.1 mg/dL (ref 1.6–2.3)

## 2017-03-10 LAB — BASIC METABOLIC PANEL
BUN/Creatinine Ratio: 7 — ABNORMAL LOW (ref 9–23)
BUN: 5 mg/dL — ABNORMAL LOW (ref 6–24)
CHLORIDE: 106 mmol/L (ref 96–106)
CO2: 23 mmol/L (ref 20–29)
CREATININE: 0.72 mg/dL (ref 0.57–1.00)
Calcium: 9.4 mg/dL (ref 8.7–10.2)
GFR calc Af Amer: 111 mL/min/{1.73_m2} (ref >59)
GFR calc non Af Amer: 97 mL/min/{1.73_m2} (ref >59)
GLUCOSE: 97 mg/dL (ref 65–99)
Potassium: 4.6 mmol/L (ref 3.5–5.2)
SODIUM: 140 mmol/L (ref 134–144)

## 2017-03-10 LAB — CULTURE, BLOOD (SINGLE)

## 2017-03-20 ENCOUNTER — Telehealth: Payer: Self-pay

## 2017-03-20 DIAGNOSIS — I4729 Other ventricular tachycardia: Secondary | ICD-10-CM

## 2017-03-20 DIAGNOSIS — I472 Ventricular tachycardia: Secondary | ICD-10-CM

## 2017-03-20 NOTE — Telephone Encounter (Signed)
Received call from patient stating her heart has been racing and it makes her feel like she is going to pass out.  She said the episodes are frequent throughout the day and it is every day.  She stated it is making her feel really bad.  Advised to send remote transmission for review and will forward the information to the device nurse.

## 2017-03-20 NOTE — Telephone Encounter (Signed)
Monitored VT episodes noted on ICD interrogation, most recently on 8/1 and 8/2 (66 monitored episodes total since 03/04/17).  Theodoro Doing, RN, spoke with Chanetta Marshall, NP, and received verbal orders to increase carvedilol to 6.25mg  BID through the weekend.  Plan to f/u with Dr. Caryl Comes on Tuesday, 03/24/17 at 2:45pm per recommendation from [employer].  Spoke with patient.  She is agreeable to f/u appointment on 8/7.  She reports that she is already taking carvedilol 6.25mg  BID.  She does not have a way to check her BP at home.  Advised I will call her back after review by APP.  Potassium level was abnormal in July per labs on file.  Dr. Caryl Comes felt that monitored VT episodes at that time were possibly electrolyte-related.  Per verbal order from Chanetta Marshall, NP, BMET and Mg level ordered.  Verbal order also obtained to increase carvedilol to 9.375mg  BID until patient's f/u appointment on 8/7.  Patient is agreeable to this plan and is aware to take three (3) of her 3.125mg  carvedilol tablets BID beginning with tonight's dose.  She verbalizes understanding of instructions to monitor for symptoms of hypotension or sustained VT, including dizziness, syncope, nausea, chest discomfort, etc.  Instructed patient to call 911 if she experiences new or worsening symptoms.  Instructed patient to call 911 for transport to the ED if she receives an ICD shock and advised to avoid driving at this time.  Patient verbalizes understanding of all instructions and denies additional questions or concerns at this time.  Called patient back--LMOVM (DPR).  Advised that labs were successfully ordered and that patient should proceed to [facility] in [place] to have them drawn today.  Reiterated shock plan and ED instructions for worsening symptoms.  Gave direct Device Clinic phone number for questions/concerns.

## 2017-03-23 ENCOUNTER — Other Ambulatory Visit: Payer: Self-pay | Admitting: Nurse Practitioner

## 2017-03-23 ENCOUNTER — Ambulatory Visit (INDEPENDENT_AMBULATORY_CARE_PROVIDER_SITE_OTHER): Payer: Medicare Other

## 2017-03-23 DIAGNOSIS — Z95810 Presence of automatic (implantable) cardiac defibrillator: Secondary | ICD-10-CM

## 2017-03-23 DIAGNOSIS — I472 Ventricular tachycardia: Secondary | ICD-10-CM | POA: Diagnosis not present

## 2017-03-23 DIAGNOSIS — I5022 Chronic systolic (congestive) heart failure: Secondary | ICD-10-CM

## 2017-03-23 LAB — BASIC METABOLIC PANEL
BUN: 6 mg/dL — ABNORMAL LOW (ref 7–25)
CALCIUM: 8.9 mg/dL (ref 8.6–10.4)
CO2: 24 mmol/L (ref 20–32)
CREATININE: 0.72 mg/dL (ref 0.50–1.05)
Chloride: 102 mmol/L (ref 98–110)
Glucose, Bld: 120 mg/dL — ABNORMAL HIGH (ref 65–99)
Potassium: 4.1 mmol/L (ref 3.5–5.3)
Sodium: 138 mmol/L (ref 135–146)

## 2017-03-23 NOTE — Progress Notes (Signed)
EPIC Encounter for ICM Monitoring  Patient Name: [name] is a 51 y.o. female Date: 03/23/2017 Primary Care Physican: Fayrene Helper, MD Primary Cardiologist: Caryl Comes Electrophysiologist: Faustino Congress Weight:Does not weigh        Heart Failure questions reviewed, pt asymptomatic for fluid symptoms.  She stated she still is not feeling well but fluid wise she is fine.    Thoracic impedance normal.  Prescribed dosage: Furosemide 20 mg 2 tablets (40 mg total) as needed. Potassium 20 mEq 1 tablet (20 mEq total) by mouth on days when Furosemide is taken.     Labs: 02/27/2017 Creatinine 0.69, BUN 7,   Potassium 2.9, Sodium 136, EGFR >60 (drawn in ER) 02/23/2017 Creatinine 0.72, BUN 8,   Potassium 4.6, Sodium 141, EGFR >60 12/30/2016 Creatinine 0.70, BUN 4,   Potassium 4.2, Sodium 140, EGFR 100-115 12/05/2016 Creatinine 0.72, BUN 7,   Potassium 2.8, Sodium 138, EGFR >60  09/22/2016 Creatinine 0.80, BUN 10, Potassium 4.3, Sodium 138   Recommendations: No changes.  Encouraged to call for fluid symptoms.  Any recommendations will be made at office visit tomorrow.   Follow-up plan: ICM clinic phone appointment on 04/24/2017.  Office appointment scheduled 03/24/2017 with Dr. Caryl Comes due to recent VT episodes.  Copy of ICM check sent to device physician.   3 month ICM trend: 03/23/2017   1 Year ICM trend:      [name], RN 03/23/2017 9:56 AM

## 2017-03-24 ENCOUNTER — Ambulatory Visit (INDEPENDENT_AMBULATORY_CARE_PROVIDER_SITE_OTHER): Payer: Medicare Other | Admitting: Internal Medicine

## 2017-03-24 ENCOUNTER — Encounter: Payer: Self-pay | Admitting: Internal Medicine

## 2017-03-24 VITALS — BP 118/72 | HR 74 | Ht 66.0 in | Wt 208.8 lb

## 2017-03-24 DIAGNOSIS — I255 Ischemic cardiomyopathy: Secondary | ICD-10-CM | POA: Diagnosis not present

## 2017-03-24 DIAGNOSIS — I472 Ventricular tachycardia, unspecified: Secondary | ICD-10-CM

## 2017-03-24 DIAGNOSIS — I5022 Chronic systolic (congestive) heart failure: Secondary | ICD-10-CM

## 2017-03-24 DIAGNOSIS — Z95810 Presence of automatic (implantable) cardiac defibrillator: Secondary | ICD-10-CM

## 2017-03-24 DIAGNOSIS — I428 Other cardiomyopathies: Secondary | ICD-10-CM | POA: Diagnosis not present

## 2017-03-24 LAB — MAGNESIUM: Magnesium: 1.8 mg/dL (ref 1.5–2.5)

## 2017-03-24 NOTE — Patient Instructions (Signed)
Medication Instructions: - Your physician recommends that you continue on your current medications as directed. Please refer to the Current Medication list given to you today.  Labwork: - none ordered  Procedures/Testing: - Your physician has requested that you have an echocardiogram. Echocardiography is a painless test that uses sound waves to create images of your heart. It provides your doctor with information about the size and shape of your heart and how well your heart's chambers and valves are working. This procedure takes approximately one hour. There are no restrictions for this procedure.  Follow-Up: - as scheduled.  Any Additional Special Instructions Will Be Listed Below (If Applicable).     If you need a refill on your cardiac medications before your next appointment, please call your pharmacy.

## 2017-03-24 NOTE — Progress Notes (Signed)
Electrophysiology Office Note   Date:  03/24/2017   ID:  SHAMONICA SCHADT, DOB 07-11-1966, MRN [id number]  PCP:  Fayrene Helper, MD  Cardiologist:   Primary Electrophysiologist:    No chief complaint on file.    History of Present Illness: LASHANNA ANGELO is a 51 y.o. female  Seen in followup for  s/p ICD implanted for primary prevention and 10/14 underwent generator replacement with revision of her 6949-lead.   Recent nuclear imaging (3/11) listed under NOTES-CV procedure--demonstrated ejection fraction of 30% with an infarct and no ischemia  she is a prior inferior wall MI he treated with stenting.  Echocardiogram 10/14 demonstrated "severe depressed LV function"     Seen last month she had episodes of ventricular tachycardia that were sustained below her detection rate. Monitoring function was activated. She has seen as an add on today because of further episodes of monitored VT   She has been complaining of more fatigue of late. This is aggravated by the up titration of her carvedilol; this also bothered her stomach. She's had some lightheadedness mostly does okay for blood pressures greater than 100   Date Cr K Mg  8/18  0.72 4.1 1.8             Past Medical History:  Diagnosis Date  . Arthritis of knee   . Automatic implantable cardiac defibrillator in situ    a. s/p prior Medtronic ICD with 6949 lead. b. s/p Gen change & lead revision 05/2013.  . Cardiomyopathy secondary    Patient has cardiomyopathy out of proportion to her ischemic heart disease  . coronary artery disease    RCA stenting Myoview 2011 EF 30% infarction dilatation without ischemia  . Depression   . Diabetes mellitus   . GERD (gastroesophageal reflux disease)   . Insomnia   . Lupus (systemic lupus erythematosus) ([place])   . Migraines   . Myocardial infarction ([place])    8 total   . Nicotine abuse   . Other and unspecified hyperlipidemia   . Paroxysmal VT ([place])   . Systolic CHF ([place])   .  VF (ventricular fibrillation) ([place])    a. Hx appropriate ICD therapy for VF.    Current Outpatient Prescriptions  Medication Sig Dispense Refill  . albuterol (PROVENTIL HFA;VENTOLIN HFA) 108 (90 Base) MCG/ACT inhaler Inhale 2 puffs into the lungs every 6 (six) hours as needed for wheezing or shortness of breath. 1 Inhaler 0  . ALPRAZolam (XANAX) 1 MG tablet TAKE 1 TABLET BY MOUTH AT BEDTIME AS NEEDED FOR ANXIETY. 30 tablet 3  . aspirin 81 MG tablet Take 1 tablet (81 mg total) by mouth daily. 30 tablet 6  . Aspirin-Acetaminophen (GOODYS BODY PAIN PO) Take 1 Package by mouth daily as needed. For pain    . atorvastatin (LIPITOR) 10 MG tablet Take 1 tablet (10 mg total) by mouth daily. 90 tablet 1  . budesonide-formoterol (SYMBICORT) 80-4.5 MCG/ACT inhaler Inhale 2 puffs into the lungs 2 (two) times daily. 1 Inhaler 3  . carvedilol (COREG) 3.125 MG tablet TAKE (1) TABLET BY MOUTH TWICE DAILY. 180 tablet 2  . clopidogrel (PLAVIX) 75 MG tablet Take 1 tablet (75 mg total) by mouth daily. 90 tablet 1  . cyclobenzaprine (FLEXERIL) 5 MG tablet One tablet twice daily , as needed, for left neck spasm and arm pain 20 tablet 0  . ergocalciferol (VITAMIN D2) 50000 units capsule Take 1 capsule (50,000 Units total) by mouth once a week. One capsule  once weekly 12 capsule 1  . fenofibrate (TRICOR) 145 MG tablet Take 145 mg by mouth daily.     . furosemide (LASIX) 20 MG tablet Take 2 tablets (40 mg total) by mouth as needed for fluid and edema 30 tablet 6  . oxyCODONE (ROXICODONE) 15 MG immediate release tablet Take 30 mg by mouth 3 (three) times daily.    . potassium chloride SA (K-DUR,KLOR-CON) 20 MEQ tablet Take 1 tablet (20 mEq total) by mouth on days when Furosemide is taken. (Patient taking differently: Take 20 mEq by mouth daily. Take 1 tablet (20 mEq total) by mouth on days when Furosemide is taken.) 30 tablet 6   No current facility-administered medications for this visit.     Allergies:   Bee venom;  Cortisone; and Robaxin [methocarbamol]   Social History:  The patient  reports that she has been smoking Cigarettes.  She has a 7.50 pack-year smoking history. She has never used smokeless tobacco. She reports that she drinks alcohol. She reports that she does not use drugs.   Family History:  The patient's family history includes Cancer in her mother; Dementia in her father; Diabetes in her brother, mother, and sister; HIV in her sister; Heart disease in her brother and mother; Hepatitis (age of onset: 77) in her mother; Hyperlipidemia in her brother, mother, and sister; Liver cancer (age of onset: 74) in her mother; Stroke (age of onset: 17) in her sister.    ROS:  Please see the history of present illness.   Positive for ,   All other systems are reviewed and negative.    PHYSICAL EXAM: VS:  BP 118/72   Pulse 74   Ht 5\' 6"  (1.676 m)   Wt 208 lb 12.8 oz (94.7 kg)   SpO2 94%   BMI 33.70 kg/m  , BMI Body mass index is 33.7 kg/m. Well developed and nourished in no acute distress HENT normal Neck supple with JVP-flat Carotids brisk and full without bruits Clear Regular rate and rhythm, no murmurs or gallops Abd-soft with active BS without hepatomegaly No Clubbing cyanosis edema Skin-warm and dry A & Oriented  Grossly normal sensory and motor function   EKG: Sinus at 74 Intervals 16/12/41 LVH with repolarization abnormalities .   Device interrogation is reviewed today in detail.  See PaceArt for details.  Recent Labs: 09/22/2016: TSH 1.55 02/27/2017: Hemoglobin 13.1; Platelets 273 03/02/2017: ALT 15 03/23/2017: BUN 6; Creat 0.72; Magnesium 1.8; Potassium 4.1; Sodium 138  02/23/2017: Cholesterol 147; HDL 33; LDL Cholesterol 97; Total CHOL/HDL Ratio 4.5; Triglycerides 87; VLDL 17     Estimated Creatinine Clearance: 95.4 mL/min (by C-G formula based on SCr of 0.72 mg/dL).   Wt Readings from Last 3 Encounters:  03/24/17 208 lb 12.8 oz (94.7 kg)  03/02/17 212 lb (96.2 kg)    12/05/16 210 lb (95.3 kg)         ASSESSMENT AND PLAN:     Ischemic/nonischemic cardiomyopathy  Implantable defibrillator-Medtronic S./P. revision   Ventricular tachycardia     Congestive heart failure-chronic-systolic  Optivol elevated   Euvolemic continue current meds  BP reasonable  Multiple episodes of prolonged monitor ventricular tachycardia at cycle length of 380--400 ms. The device was reprogrammed to provide ATP at 400 ms for events longer than 100 beats duration. No shock therapies are programmed to be delivered for tachycardia less than 200 bpm.  She is advised not to drive X 6 months.  She will continue her carvedilol at the uptitrated doses.  Drenda Freeze   03/24/2017 4:07 PM      [address] [place] [address]  [phone] (office) [phone] (fax)

## 2017-03-25 LAB — CUP PACEART INCLINIC DEVICE CHECK
Battery Remaining Longevity: 107 mo
HIGH POWER IMPEDANCE MEASURED VALUE: 80 Ohm
Implantable Lead Implant Date: 20141031
Implantable Lead Location: 753860
Implantable Lead Model: 6935
Implantable Pulse Generator Implant Date: 20141031
Lead Channel Pacing Threshold Amplitude: 0.5 V
Lead Channel Pacing Threshold Pulse Width: 0.4 ms
Lead Channel Setting Sensing Sensitivity: 0.3 mV
MDC IDC MSMT BATTERY VOLTAGE: 2.99 V
MDC IDC MSMT LEADCHNL RV IMPEDANCE VALUE: 456 Ohm
MDC IDC MSMT LEADCHNL RV IMPEDANCE VALUE: 513 Ohm
MDC IDC MSMT LEADCHNL RV SENSING INTR AMPL: 9 mV
MDC IDC SESS DTM: 20180807190750
MDC IDC SET LEADCHNL RV PACING AMPLITUDE: 2.5 V
MDC IDC SET LEADCHNL RV PACING PULSEWIDTH: 0.4 ms
MDC IDC STAT BRADY RV PERCENT PACED: 0 %

## 2017-03-31 ENCOUNTER — Ambulatory Visit (HOSPITAL_COMMUNITY): Payer: Medicare Other | Attending: Cardiovascular Disease

## 2017-03-31 ENCOUNTER — Other Ambulatory Visit: Payer: Self-pay

## 2017-03-31 DIAGNOSIS — I428 Other cardiomyopathies: Secondary | ICD-10-CM | POA: Diagnosis not present

## 2017-04-02 ENCOUNTER — Other Ambulatory Visit: Payer: Self-pay | Admitting: Family Medicine

## 2017-04-18 ENCOUNTER — Other Ambulatory Visit: Payer: Self-pay | Admitting: Internal Medicine

## 2017-04-24 ENCOUNTER — Telehealth: Payer: Self-pay

## 2017-04-24 ENCOUNTER — Ambulatory Visit (INDEPENDENT_AMBULATORY_CARE_PROVIDER_SITE_OTHER): Payer: Medicare Other

## 2017-04-24 DIAGNOSIS — I5022 Chronic systolic (congestive) heart failure: Secondary | ICD-10-CM

## 2017-04-24 DIAGNOSIS — Z95810 Presence of automatic (implantable) cardiac defibrillator: Secondary | ICD-10-CM

## 2017-04-24 NOTE — Telephone Encounter (Signed)
Spoke with patient who stats that she recently obtained a wrist BP monitor cuff and that her last measurement was 110/73 "a couple of days ago". She reports feeling "fine" without any lightheadedness. Stats she noticed her heart raced a couple of days ago but was unsure of exactly when. She reports taking fluid pills over the last couple of days as she noticed her fingers swelling. Patient is followed in [facility] clinic.

## 2017-04-24 NOTE — Progress Notes (Signed)
EPIC Encounter for ICM Monitoring  Patient Name: [name] is a 51 y.o. female Date: 04/24/2017 Primary Care Physican: Fayrene Helper, MD Primary Cardiologist: Caryl Comes Electrophysiologist: Faustino Congress Weight:Does not weigh   Observations:   RV Capture Management: Actual safety margin (4 X) > programmed margin (2 X).  All therapies failed in 1 VT/VF episodes.   At least 1 therapy failed in 2 VT/VF episodes.   6 treated VT/VF episodes longer than 30 sec.   1 monitored VT episodes, longest was 7 min.   Last VT Therapy set On is not a full energy CV.   VF ATP + Charging therapies will not be delivered. ""Deliver ATP if last 8 R-R >="" in Params/VF Therapies/ATP is outside of the VF detection zone or overlapped by the [facility] Interval.  Event Summary ?6 VT/VF  ?1 Monitored VT  ?5366 VT-NS      Attempted call to patient and unable to reach.  Left detailed message regarding transmission and advised to take PRN Furosemide and Potassium as prescribed x 3 days.  Transmission reviewed.   Patient has office visit with Dr Caryl Comes on 8/6 for follow up on VT episodes.    Thoracic impedance abnormal suggesting fluid accumulation.  Device RN following up on VT episodes  Prescribed dosage: Furosemide 20 mg 2 tablets (40 mg total) as needed. Potassium 20 mEq 1 tablet (20 mEq total) by mouth on days when Furosemide is taken.    Labs: 02/27/2017 Creatinine 0.69, BUN 7, Potassium 2.9, Sodium 136, EGFR >60 (drawn in ER) 02/23/2017 Creatinine 0.72, BUN 8, Potassium 4.6, Sodium 141, EGFR >60 12/30/2016 Creatinine 0.70, BUN 4, Potassium 4.2, Sodium 140, EGFR 100-115 12/05/2016 Creatinine 0.72, BUN 7, Potassium 2.8, Sodium 138, EGFR >60  09/22/2016 Creatinine 0.80, BUN 10, Potassium 4.3, Sodium 138   Recommendations:  Unable to reach.  Left voice mail message to return call.   Follow-up plan: ICM clinic phone appointment on 04/28/2017 to recheck fluid levels.  Office appointment with  Dr Caryl Comes on 05/15/2017.    Copy of ICM check sent to Dr. Caryl Comes.   3 month ICM trend: 04/24/2017   1 Year ICM trend:      [name], RN 04/24/2017 8:43 AM

## 2017-04-24 NOTE — Telephone Encounter (Signed)
LVM for call back to discuss home BP recordings in correlation with VT/NSVT episodes seen on remote transmission.

## 2017-04-24 NOTE — Telephone Encounter (Signed)
Remote ICM transmission received.  Attempted call to left detailed message regarding transmission that shows fluid accumulation.  Advised to take PRN Furosemide and Potassium as prescribed x 3 days and will recheck fluid levels on 04/28/2017.  Advised her to return call.

## 2017-04-28 ENCOUNTER — Ambulatory Visit (INDEPENDENT_AMBULATORY_CARE_PROVIDER_SITE_OTHER): Payer: Self-pay

## 2017-04-28 DIAGNOSIS — I5022 Chronic systolic (congestive) heart failure: Secondary | ICD-10-CM

## 2017-04-28 DIAGNOSIS — Z95810 Presence of automatic (implantable) cardiac defibrillator: Secondary | ICD-10-CM

## 2017-04-28 NOTE — Progress Notes (Signed)
EPIC Encounter for ICM Monitoring  Patient Name: [name] is a 51 y.o. female Date: 04/28/2017 Primary Care Physican: Fayrene Helper, MD Primary Cardiologist: Caryl Comes Electrophysiologist: Faustino Congress Weight:Does not weigh  Since 24-Apr-2017  VT-NS (>4 beats, >150 bpm) 344        Heart Failure questions reviewed, pt asymptomatic today but did have swelling of her fingers during decreased impedance.  She said she never gains weight when she has fluid accumulation on the hand swelling.    Thoracic impedance returned to normal since last ICM remote transmission 04/24/2017.  Prescribed dosage: Furosemide 20 mg 2 tablets (40 mg total) as needed. Potassium 20 mEq 1 tablet (20 mEq total) by mouth on days when Furosemide is taken.   Labs: 02/27/2017 Creatinine 0.69, BUN 7, Potassium 2.9, Sodium 136, EGFR >60 (drawn in ER) 02/23/2017 Creatinine 0.72, BUN 8, Potassium 4.6, Sodium 141, EGFR >60 12/30/2016 Creatinine 0.70, BUN 4, Potassium 4.2, Sodium 140, EGFR 100-115 12/05/2016 Creatinine 0.72, BUN 7, Potassium 2.8, Sodium 138, EGFR >60  09/22/2016 Creatinine 0.80, BUN 10, Potassium 4.3, Sodium 138   Recommendations: Patient has been taking PRN Furosemide as prescribed for several days with relief of symptoms.   Encouraged to call for fluid symptoms.  Follow-up plan: ICM clinic phone appointment on 06/15/2017.  Office appointment with Dr Caryl Comes on 05/15/2017.  Copy of ICM check sent to Dr. Caryl Comes.   3 month ICM trend: 04/28/2017         1 Year ICM trend:      [name], RN 04/28/2017 10:08 AM

## 2017-05-01 ENCOUNTER — Encounter: Payer: Self-pay | Admitting: Internal Medicine

## 2017-05-04 ENCOUNTER — Telehealth: Payer: Self-pay | Admitting: Internal Medicine

## 2017-05-04 ENCOUNTER — Other Ambulatory Visit: Payer: Self-pay | Admitting: Family Medicine

## 2017-05-04 MED ORDER — CARVEDILOL 3.125 MG PO TABS: ORAL_TABLET | ORAL | 3 refills | Status: DC

## 2017-05-04 NOTE — Telephone Encounter (Signed)
Seen 03/02/17

## 2017-05-04 NOTE — Telephone Encounter (Signed)
Spoke with patient and she reports that Dr Caryl Comes instructed her to take three tablets BID. Per phone note from 03/20/16 patient was to remain on this dose until her office visit. An order was not put in for this dose. Okay to refill?  Please advise. Thanks, MI

## 2017-05-04 NOTE — Telephone Encounter (Signed)
Per 03/20/17 device clinic note:  "Potassium level was abnormal in July per labs on file.  Dr. Caryl Comes felt that monitored VT episodes at that time were possibly electrolyte-related.  Per verbal order from Chanetta Marshall, NP, BMET and Mg level ordered.  Verbal order also obtained to increase carvedilol to 9.375mg  BID until patient's f/u appointment on 8/7.  Patient is agreeable to this plan and is aware to take three (3) of her 3.125mg  carvedilol tablets BID beginning with tonight's dose"   I called and spoke with the patient and clarified with her that she is taking coreg 3.125 mg- 3 tablets (9.375 mg) BID.  I advised her I will refill this for her.

## 2017-05-04 NOTE — Telephone Encounter (Signed)
New message    Pt is calling because her medication was changed. The dose was increased and she needs more but the pharmacy wont fill it because it isn't time. She needs a new prescription.   *STAT* If patient is at the pharmacy, call can be transferred to refill team.   1. Which medications need to be refilled? (please list name of each medication and dose if known) carvedilol 3.125 mg  2. Which pharmacy/location (including street and city if local pharmacy) is medication to be sent to? [facility] [phone]  3. Do they need a 30 day or 90 day supply? 30 day

## 2017-05-15 ENCOUNTER — Ambulatory Visit (INDEPENDENT_AMBULATORY_CARE_PROVIDER_SITE_OTHER): Payer: Medicare Other | Admitting: Internal Medicine

## 2017-05-15 ENCOUNTER — Encounter: Payer: Self-pay | Admitting: Internal Medicine

## 2017-05-15 VITALS — BP 88/60 | HR 76 | Ht 66.0 in | Wt 210.0 lb

## 2017-05-15 DIAGNOSIS — I472 Ventricular tachycardia, unspecified: Secondary | ICD-10-CM

## 2017-05-15 DIAGNOSIS — I255 Ischemic cardiomyopathy: Secondary | ICD-10-CM

## 2017-05-15 DIAGNOSIS — I428 Other cardiomyopathies: Secondary | ICD-10-CM

## 2017-05-15 DIAGNOSIS — T827XXA Infection and inflammatory reaction due to other cardiac and vascular devices, implants and grafts, initial encounter: Secondary | ICD-10-CM | POA: Diagnosis not present

## 2017-05-15 DIAGNOSIS — I5022 Chronic systolic (congestive) heart failure: Secondary | ICD-10-CM

## 2017-05-15 LAB — CUP PACEART INCLINIC DEVICE CHECK
Battery Voltage: 3 V
Brady Statistic RV Percent Paced: 0.01 %
Date Time Interrogation Session: 20180928144909
HighPow Impedance: 83 Ohm
Implantable Lead Location: 753860
Implantable Lead Model: 6935
Lead Channel Impedance Value: 418 Ohm
Lead Channel Impedance Value: 475 Ohm
Lead Channel Pacing Threshold Amplitude: 0.625 V
Lead Channel Sensing Intrinsic Amplitude: 7 mV
Lead Channel Sensing Intrinsic Amplitude: 9.625 mV
Lead Channel Setting Pacing Amplitude: 2.5 V
Lead Channel Setting Pacing Pulse Width: 0.4 ms
MDC IDC LEAD IMPLANT DT: 20141031
MDC IDC MSMT BATTERY REMAINING LONGEVITY: 104 mo
MDC IDC MSMT LEADCHNL RV PACING THRESHOLD PULSEWIDTH: 0.4 ms
MDC IDC PG IMPLANT DT: 20141031
MDC IDC SET LEADCHNL RV SENSING SENSITIVITY: 0.3 mV

## 2017-05-15 MED ORDER — CARVEDILOL 12.5 MG PO TABS: 12.5000 mg | ORAL_TABLET | Freq: Two times a day (BID) | ORAL | 6 refills | Status: DC

## 2017-05-15 NOTE — Patient Instructions (Signed)
Medication Instructions: - Your physician has recommended you make the following change in your medication:  1) Increase coreg (carvedilol) to 12.5 mg- take 1 tablet by mouth twice daily  Labwork: - none ordered  Procedures/Testing: - none ordered  Follow-Up: - Your physician recommends that you schedule a follow-up appointment in: 4-6 weeks with Luetta Nutting, NP/ [place], [place] for Dr. Caryl Comes.   Any Additional Special Instructions Will Be Listed Below (If Applicable).     If you need a refill on your cardiac medications before your next appointment, please call your pharmacy.

## 2017-05-15 NOTE — Progress Notes (Signed)
Electrophysiology Office Note   Date:  05/15/2017   ID:  [name], DOB 09-17-1965, MRN [id number]  PCP:  Fayrene Helper, MD  Cardiologist:   Primary Electrophysiologist:    No chief complaint on file.    History of Present Illness: [name] is a 51 y.o. female  Seen in followup for  s/p ICD implanted for primary prevention and 10/14 underwent generator replacement with revision of her 6949-lead.   Recent nuclear imaging (3/11) listed under NOTES-CV procedure--demonstrated ejection fraction of 30% with an infarct and no ischemia  she is a prior inferior wall MI he treated with stenting.  Echocardiogram 10/14 demonstrated "severe depressed LV function"   DATE TEST    10/147    Ecjp   EF Severe depression      8/18    Echo   EF 20-25 % LAE(43/2.01/42)         Seen last month she had episodes of ventricular tachycardia that were sustained below her detection rate. Monitoring function was activated She has had intercurrent  Ventricular tachycardia and successful ATP but rapid recurring of monomorphic VT.    This occurred in the context of her having missed carvedilol because of refill issues.   Antiarrhythmics Date Reason stopped              She's had some lightheadedness in the past mostly does okay for blood pressures greater than 100; No lightheadedness recently even with the uptitrated beta blocker.   Date Cr K Mg  8/18  0.72 4.1 1.8             Past Medical History:  Diagnosis Date  . Arthritis of knee   . Automatic implantable cardiac defibrillator in situ    a. s/p prior Medtronic ICD with 6949 lead. b. s/p Gen change & lead revision 05/2013.  . Cardiomyopathy secondary    Patient has cardiomyopathy out of proportion to her ischemic heart disease  . coronary artery disease    RCA stenting Myoview 2011 EF 30% infarction dilatation without ischemia  . Depression   . Diabetes mellitus   . GERD (gastroesophageal reflux disease)   .  Insomnia   . Lupus (systemic lupus erythematosus) ([place])   . Migraines   . Myocardial infarction ([place])    8 total   . Nicotine abuse   . Other and unspecified hyperlipidemia   . Paroxysmal VT ([place])   . Systolic CHF ([place])   . VF (ventricular fibrillation) ([place])    a. Hx appropriate ICD therapy for VF.    Current Outpatient Prescriptions  Medication Sig Dispense Refill  . albuterol (PROVENTIL HFA;VENTOLIN HFA) 108 (90 Base) MCG/ACT inhaler Inhale 2 puffs into the lungs every 6 (six) hours as needed for wheezing or shortness of breath. 1 Inhaler 0  . ALPRAZolam (XANAX) 1 MG tablet TAKE 1 TABLET BY MOUTH AT BEDTIME AS NEEDED FOR ANXIETY. 30 tablet 3  . aspirin 81 MG tablet Take 1 tablet (81 mg total) by mouth daily. 30 tablet 6  . Aspirin-Acetaminophen (GOODYS BODY PAIN PO) Take 1 Package by mouth daily as needed. For pain    . atorvastatin (LIPITOR) 10 MG tablet Take 1 tablet (10 mg total) by mouth daily. 90 tablet 1  . budesonide-formoterol (SYMBICORT) 80-4.5 MCG/ACT inhaler Inhale 2 puffs into the lungs 2 (two) times daily. 1 Inhaler 3  . carvedilol (COREG) 3.125 MG tablet Take 3 tablets ( 9.375 mg) by mouth twice daily 540 tablet 3  .  clopidogrel (PLAVIX) 75 MG tablet Take 1 tablet (75 mg total) by mouth daily. Please keep 05/15/17 appt for future refills 30 tablet 0  . cyclobenzaprine (FLEXERIL) 5 MG tablet TAKE ONE TABLET BY MOUTH TWICE DAILY AS NEEDED FOR LEFT NECK SPASM AND ARM PAIN. 20 tablet 0  . ergocalciferol (VITAMIN D2) 50000 units capsule Take 1 capsule (50,000 Units total) by mouth once a week. One capsule once weekly 12 capsule 1  . fenofibrate (TRICOR) 145 MG tablet Take 145 mg by mouth daily.     . furosemide (LASIX) 20 MG tablet Take 2 tablets (40 mg total) by mouth as needed for fluid and edema 30 tablet 6  . oxyCODONE (ROXICODONE) 15 MG immediate release tablet Take 30 mg by mouth 3 (three) times daily.    . potassium chloride SA (K-DUR,KLOR-CON) 20 MEQ tablet Take 1  tablet (20 mEq total) by mouth on days when Furosemide is taken. (Patient taking differently: Take 20 mEq by mouth daily. Take 1 tablet (20 mEq total) by mouth on days when Furosemide is taken.) 30 tablet 6   No current facility-administered medications for this visit.     Allergies:   Bee venom; Cortisone; and Robaxin [methocarbamol]   Social History:  The patient  reports that she has been smoking Cigarettes.  She has a 7.50 pack-year smoking history. She has never used smokeless tobacco. She reports that she drinks alcohol. She reports that she does not use drugs.   Family History:  The patient's family history includes Cancer in her mother; Dementia in her father; Diabetes in her brother, mother, and sister; HIV in her sister; Heart disease in her brother and mother; Hepatitis (age of onset: 60) in her mother; Hyperlipidemia in her brother, mother, and sister; Liver cancer (age of onset: 58) in her mother; Stroke (age of onset: 15) in her sister.    ROS:  Please see the history of present illness.   Positive for ,   All other systems are reviewed and negative.    PHYSICAL EXAM: VS:  BP (!) 88/60   Pulse 76   Ht 5\' 6"  (1.676 m)   Wt 210 lb (95.3 kg)   SpO2 98%   BMI 33.89 kg/m  , BMI Body mass index is 33.89 kg/m. Well developed and nourished in no acute distress HENT normal Neck supple with JVP-flat Carotids brisk and full without bruits Clear Regular rate and rhythm, no murmurs or gallops Abd-soft with active BS without hepatomegaly No Clubbing cyanosis edema Skin-warm and dry A & Oriented  Grossly normal sensory and motor function   EKG: Sinus at 77 16/12/42    .   Device interrogation is reviewed today in detail.  See PaceArt for details.  Recent Labs: 09/22/2016: TSH 1.55 02/27/2017: Hemoglobin 13.1; Platelets 273 03/02/2017: ALT 15 03/23/2017: BUN 6; Creat 0.72; Magnesium 1.8; Potassium 4.1; Sodium 138  02/23/2017: Cholesterol 147; HDL 33; LDL Cholesterol 97; Total  CHOL/HDL Ratio 4.5; Triglycerides 87; VLDL 17     CrCl cannot be calculated (Patient's most recent lab result is older than the maximum 21 days allowed.).   Wt Readings from Last 3 Encounters:  05/15/17 210 lb (95.3 kg)  03/24/17 208 lb 12.8 oz (94.7 kg)  03/02/17 212 lb (96.2 kg)         ASSESSMENT AND PLAN:     Ischemic/nonischemic cardiomyopathy  Implantable defibrillator-Medtronic S./P. revision   Ventricular tachycardia     Congestive heart failure-chronic-systolic  Optivol elevated   Euvolemic continue  current meds  BP reasonable  She had multiple episodes of ventricular tachycardia. ATP successfully terminated but then they resumed quickly. This occurred in the context of no beta blockers; hence, we will resume it and I will up titrate from 9.375--12.5 notwithstanding her blood pressure 88 and she is having no symptoms of lightheadedness.  We will revisit this again in 3 or 4 weeks. If there is no further VT we will continue this course; otherwise would initiate antiarrhythmic therapy with mexiletine. She is young for amiodarone and her nonischemic myopathy makes catheter ablation as first therapy I think less inviting   More than 50% of 45 min was spent in counseling related to the above    Signed, Virl Axe   05/15/2017 11:06 AM      [place] [address]  [phone] (office) [phone] (fax)

## 2017-05-21 DIAGNOSIS — I10 Essential (primary) hypertension: Secondary | ICD-10-CM | POA: Diagnosis not present

## 2017-05-21 DIAGNOSIS — Z720 Tobacco use: Secondary | ICD-10-CM | POA: Diagnosis not present

## 2017-05-21 DIAGNOSIS — M329 Systemic lupus erythematosus, unspecified: Secondary | ICD-10-CM | POA: Diagnosis not present

## 2017-06-05 ENCOUNTER — Telehealth: Payer: Self-pay | Admitting: Internal Medicine

## 2017-06-05 ENCOUNTER — Other Ambulatory Visit: Payer: Self-pay | Admitting: *Deleted

## 2017-06-05 MED ORDER — CLOPIDOGREL BISULFATE 75 MG PO TABS: 75.0000 mg | ORAL_TABLET | Freq: Every day | ORAL | 3 refills | Status: DC

## 2017-06-05 NOTE — Telephone Encounter (Signed)
°*  STAT* If patient is at the pharmacy, call can be transferred to refill team.   1. Which medications need to be refilled? (please list name of each medication and dose if known) Plavix 75 mg   2. Which pharmacy/location (including street and city if local pharmacy) is medication to be sent to?[facility]   3. Do they need a 30 day or 90 day supply?[place]

## 2017-06-15 ENCOUNTER — Ambulatory Visit (INDEPENDENT_AMBULATORY_CARE_PROVIDER_SITE_OTHER): Payer: Medicare Other | Admitting: *Deleted

## 2017-06-15 ENCOUNTER — Telehealth: Payer: Self-pay

## 2017-06-15 DIAGNOSIS — I5022 Chronic systolic (congestive) heart failure: Secondary | ICD-10-CM | POA: Diagnosis not present

## 2017-06-15 DIAGNOSIS — I2589 Other forms of chronic ischemic heart disease: Secondary | ICD-10-CM | POA: Diagnosis not present

## 2017-06-15 DIAGNOSIS — I255 Ischemic cardiomyopathy: Secondary | ICD-10-CM

## 2017-06-15 DIAGNOSIS — T827XXA Infection and inflammatory reaction due to other cardiac and vascular devices, implants and grafts, initial encounter: Secondary | ICD-10-CM | POA: Diagnosis not present

## 2017-06-15 NOTE — Progress Notes (Signed)
Remote ICD transmission.   

## 2017-06-15 NOTE — Telephone Encounter (Signed)
Remote ICM transmission received.  Attempted call to patient and call would not go through.

## 2017-06-15 NOTE — Progress Notes (Signed)
EPIC Encounter for ICM Monitoring  Patient Name: [name] is a 51 y.o. female Date: 06/15/2017 Primary Care Physican: Fayrene Helper, MD Primary Cardiologist: Caryl Comes Electrophysiologist: Faustino Congress Weight:Does not weigh  Clinical Status (15-May-2017 to 15-Jun-2017) Treated VT/VF 3 episodes  VT-NS (>4 beats, >150 bpm) 595  OBSERVATIONS (4)  RV Capture Management: Actual safety margin (4 X) > programmed margin (2 X).  3 treated VT/VF episodes longer than 30 sec.  Last VT Therapy set On is not a full energy CV.  VF ATP + Charging therapies will not be delivered. ""Deliver ATP if last 8 R-R >="" in Params/VF Therapies/ATP is outside of the VF detection zone or overlapped by the FVT Interval.       Attempted call to patient for to address fluid accumulation and treated VT per remote transmission.  Unable to reach.        Thoracic impedance abnormal suggesting fluid accumulation.  Prescribed dosage: Furosemide 20 mg 2 tablets (40 mg total) as needed. Potassium 20 mEq 1 tablet (20 mEq total) by mouth on days when Furosemide is taken.   Labs: 02/27/2017 Creatinine 0.69, BUN 7, Potassium 2.9, Sodium 136, EGFR >60 (drawn in ER) 02/23/2017 Creatinine 0.72, BUN 8, Potassium 4.6, Sodium 141, EGFR >60 12/30/2016 Creatinine 0.70, BUN 4, Potassium 4.2, Sodium 140, EGFR 100-115 12/05/2016 Creatinine 0.72, BUN 7, Potassium 2.8, Sodium 138, EGFR >60  09/22/2016 Creatinine 0.80, BUN 10, Potassium 4.3, Sodium 138   Recommendations: NONE - Unable to reach patient   Follow-up plan: ICM clinic phone appointment on 06/25/2017.  Office appointment scheduled 06/18/2017 with Tommye Standard, PA.  Copy of ICM check sent to Dr. Caryl Comes.   3 month ICM trend: 06/15/2017   1 Year ICM trend:      [name], RN 06/15/2017 4:48 PM

## 2017-06-16 LAB — CUP PACEART REMOTE DEVICE CHECK
HIGH POWER IMPEDANCE MEASURED VALUE: 71 Ohm
Implantable Lead Implant Date: 20141031
Lead Channel Impedance Value: 418 Ohm
Lead Channel Pacing Threshold Amplitude: 0.625 V
Lead Channel Pacing Threshold Pulse Width: 0.4 ms
Lead Channel Sensing Intrinsic Amplitude: 6.625 mV
Lead Channel Sensing Intrinsic Amplitude: 6.625 mV
Lead Channel Setting Sensing Sensitivity: 0.3 mV
MDC IDC LEAD LOCATION: 753860
MDC IDC MSMT BATTERY REMAINING LONGEVITY: 103 mo
MDC IDC MSMT BATTERY VOLTAGE: 2.98 V
MDC IDC MSMT LEADCHNL RV IMPEDANCE VALUE: 513 Ohm
MDC IDC PG IMPLANT DT: 20141031
MDC IDC SESS DTM: 20181029052405
MDC IDC SET LEADCHNL RV PACING AMPLITUDE: 2.5 V
MDC IDC SET LEADCHNL RV PACING PULSEWIDTH: 0.4 ms
MDC IDC STAT BRADY RV PERCENT PACED: 0.01 %

## 2017-06-16 NOTE — Progress Notes (Signed)
Reviewed transmission with Dr Caryl Comes in the office.  No changes today.  Patient has follow up appointment with Tommye Standard, PA on 11/1 and Dr Caryl Comes said the VT episodes will be addressed.

## 2017-06-16 NOTE — Progress Notes (Signed)
Call to daughter [facility] and emergency contact).  Advised I attempted to call patient and she confirmed that the phone number I have is correct and didn't know it was out of service.  I reviewed the transmission with her and confirmed patient will be at the appointment with Tommye Standard, PA on Thursday.  She will have patient provide updated number.  06/25/2017 transmission scheduled to recheck fluid levels.

## 2017-06-16 NOTE — Progress Notes (Signed)
Attempted call to patient again today but phone number [phone] was not in service.

## 2017-06-18 ENCOUNTER — Ambulatory Visit (INDEPENDENT_AMBULATORY_CARE_PROVIDER_SITE_OTHER): Payer: Medicare Other | Admitting: Physician Assistant

## 2017-06-18 VITALS — BP 114/68 | HR 85 | Ht 66.0 in | Wt 206.0 lb

## 2017-06-18 DIAGNOSIS — I5022 Chronic systolic (congestive) heart failure: Secondary | ICD-10-CM

## 2017-06-18 DIAGNOSIS — I255 Ischemic cardiomyopathy: Secondary | ICD-10-CM

## 2017-06-18 DIAGNOSIS — Z95810 Presence of automatic (implantable) cardiac defibrillator: Secondary | ICD-10-CM

## 2017-06-18 DIAGNOSIS — I472 Ventricular tachycardia, unspecified: Secondary | ICD-10-CM

## 2017-06-18 DIAGNOSIS — I428 Other cardiomyopathies: Secondary | ICD-10-CM

## 2017-06-18 DIAGNOSIS — I2510 Atherosclerotic heart disease of native coronary artery without angina pectoris: Secondary | ICD-10-CM

## 2017-06-18 MED ORDER — MEXILETINE HCL 200 MG PO CAPS: 200.0000 mg | ORAL_CAPSULE | Freq: Three times a day (TID) | ORAL | 3 refills | Status: DC

## 2017-06-18 NOTE — Progress Notes (Signed)
Cardiology Office Note Date:  06/18/2017  Patient ID:  [name], [name], [name], MRN [id number] PCP:  Fayrene Helper, MD  Cardiologist:  Dr. Caryl Comes   Chief Complaint: None, planned visit  History of Present Illness: [name] is a 50 y.o. female with history of recurrent VT w/ICD and appropriate therapies, CAD (hx of RCA PCI), ICM/NICM, chronic CHF, DM, Lupus  She was last seen by Dr. Caryl Comes 05/15/17, noted more VT episodes, historically had occurred with missed BB doses.  Se is accompanied by her daughter, and reports she does feel palpitations, not nearly as much as she did before the increase in her coreg.  She reports compliance with the coreg, no missed doses and home BP when she checks run SBP 100-105.  No dizzy spells, no near syncope or syncope.  She denies any kind of CP.  She reports good exertional capacity, mentions going the the club every other Saturday and able to dance all night long, denies any significant ETOH use.  She mentions she will clean the kitchen and take a break before moving on to the living room, this is unchanged for years.  She tells me she can walk small hills though her knees give up prior to getting SOB.    We received carelink transmission noted VT episodes that required ATP therapies, Dr. Caryl Comes reviewed and suggested add on mexiletine 200mg  BID (if this was cost heavy then to start amiodarone).  Device information: MDT single chamber ICD, implanted 06/17/13, Dr. Caryl Comes Hx of [phone] lead revision + Hx of appropriate therapy No hx of AAD  Past Medical History:  Diagnosis Date  . Arthritis of knee   . Automatic implantable cardiac defibrillator in situ    a. s/p prior Medtronic ICD with 6949 lead. b. s/p Gen change & lead revision 05/2013.  . Cardiomyopathy secondary    Patient has cardiomyopathy out of proportion to her ischemic heart disease  . coronary artery disease    RCA stenting Myoview 2011 EF 30% infarction dilatation without  ischemia  . Depression   . Diabetes mellitus   . GERD (gastroesophageal reflux disease)   . Insomnia   . Lupus (systemic lupus erythematosus) ([place])   . Migraines   . Myocardial infarction ([place])    8 total   . Nicotine abuse   . Other and unspecified hyperlipidemia   . Paroxysmal VT ([place])   . Systolic CHF ([place])   . VF (ventricular fibrillation) ([place])    a. Hx appropriate ICD therapy for VF.    Past Surgical History:  Procedure Laterality Date  . ABDOMINAL HYSTERECTOMY    . CHOLECYSTECTOMY    . COLONOSCOPY  07/15/2011   SLF: internal hemorrhoids/hyperplastic polyps in the rectum/tubular adenomaSURVEILLANCE Nov 2017  . defibrillator placed   2009 and 05/2013  . ICD GENERATOR CHANGE  06/17/2013   Dr Caryl Comes  . IMPLANTABLE CARDIOVERTER DEFIBRILLATOR (ICD) GENERATOR CHANGE Left 06/17/2013   Procedure: ICD GENERATOR CHANGE;  Surgeon: Deboraha Sprang, MD;  Location: [facility] CATH LAB;  Service: Cardiovascular;  Laterality: Left;  . LEAD REVISION N/A 06/17/2013   Procedure: LEAD REVISION;  Surgeon: Deboraha Sprang, MD;  Location: [facility] CATH LAB;  Service: Cardiovascular;  Laterality: N/A;  . ROTATOR CUFF REPAIR Right 2002    Current Outpatient Prescriptions  Medication Sig Dispense Refill  . albuterol (PROVENTIL HFA;VENTOLIN HFA) 108 (90 Base) MCG/ACT inhaler Inhale 2 puffs into the lungs every 6 (six) hours as needed for wheezing or shortness of  breath. 1 Inhaler 0  . ALPRAZolam (XANAX) 1 MG tablet TAKE 1 TABLET BY MOUTH AT BEDTIME AS NEEDED FOR ANXIETY. 30 tablet 3  . aspirin 81 MG tablet Take 1 tablet (81 mg total) by mouth daily. 30 tablet 6  . Aspirin-Acetaminophen (GOODYS BODY PAIN PO) Take 1 Package by mouth daily as needed. For pain    . atorvastatin (LIPITOR) 10 MG tablet Take 1 tablet (10 mg total) by mouth daily. 90 tablet 1  . budesonide-formoterol (SYMBICORT) 80-4.5 MCG/ACT inhaler Inhale 2 puffs into the lungs 2 (two) times daily. 1 Inhaler 3  . carvedilol (COREG) 12.5 MG tablet Take  1 tablet (12.5 mg total) by mouth 2 (two) times daily. 60 tablet 6  . clopidogrel (PLAVIX) 75 MG tablet Take 1 tablet (75 mg total) by mouth daily. 90 tablet 3  . cyclobenzaprine (FLEXERIL) 5 MG tablet TAKE ONE TABLET BY MOUTH TWICE DAILY AS NEEDED FOR LEFT NECK SPASM AND ARM PAIN. 20 tablet 0  . ergocalciferol (VITAMIN D2) 50000 units capsule Take 1 capsule (50,000 Units total) by mouth once a week. One capsule once weekly 12 capsule 1  . fenofibrate (TRICOR) 145 MG tablet Take 145 mg by mouth daily.     . furosemide (LASIX) 20 MG tablet Take 2 tablets (40 mg total) by mouth as needed for fluid and edema 30 tablet 6  . oxyCODONE (ROXICODONE) 15 MG immediate release tablet Take 30 mg by mouth 3 (three) times daily.    . potassium chloride SA (K-DUR,KLOR-CON) 20 MEQ tablet Take 1 tablet (20 mEq total) by mouth on days when Furosemide is taken. (Patient taking differently: Take 20 mEq by mouth daily. Take 1 tablet (20 mEq total) by mouth on days when Furosemide is taken.) 30 tablet 6  . mexiletine (MEXITIL) 200 MG capsule Take 1 capsule (200 mg total) by mouth 3 (three) times daily. 60 capsule 3   No current facility-administered medications for this visit.     Allergies:   Bee venom; Cortisone; and Robaxin [methocarbamol]   Social History:  The patient  reports that she has been smoking Cigarettes.  She has a 7.50 pack-year smoking history. She has never used smokeless tobacco. She reports that she drinks alcohol. She reports that she does not use drugs.   Family History:  The patient's family history includes Cancer in her mother; Dementia in her father; Diabetes in her brother, mother, and sister; HIV in her sister; Heart disease in her brother and mother; Hepatitis (age of onset: 54) in her mother; Hyperlipidemia in her brother, mother, and sister; Liver cancer (age of onset: 42) in her mother; Stroke (age of onset: 35) in her sister.  ROS:  Please see the history of present illness.  All other  systems are reviewed and otherwise negative.   PHYSICAL EXAM:  VS:  BP 114/68   Pulse 85   Ht 5\' 6"  (1.676 m)   Wt 206 lb (93.4 kg)   BMI 33.25 kg/m  BMI: Body mass index is 33.25 kg/m. Well nourished, well developed, in no acute distress  HEENT: normocephalic, atraumatic  Neck: no JVD, carotid bruits or masses Cardiac:  RRR; no significant murmurs, no rubs, or gallops Lungs:  CTA b/l, no wheezing, rhonchi or rales  Abd: soft, nontender MS: no deformity or atrophy Ext:  no edema  Skin: warm and dry, no rash Neuro:  No gross deficits appreciated Psych: euthymic mood, full affect  ICD site is stable, no tethering or discomfort  EKG:  Not done today ICD interrogation done today and reviewed by myself: battery and lead measurements are good.  Numerous NSVT episodes, no new treated episodes since her carelink transmission  03/31/17: TTE Study Conclusions - Left ventricle: The cavity size was moderately dilated. There was   moderate concentric hypertrophy. Systolic function was severely   reduced. The estimated ejection fraction was in the range of 20%   to 25%. Diffuse hypokinesis. Features are consistent with a   pseudonormal left ventricular filling pattern, with concomitant   abnormal relaxation and increased filling pressure (grade 2   diastolic dysfunction). Doppler parameters are consistent with   high ventricular filling pressure. - Aortic valve: Transvalvular velocity was within the normal range.   There was no stenosis. There was no regurgitation. - Mitral valve: Transvalvular velocity was within the normal range.   There was no evidence for stenosis. There was moderate   regurgitation. - Left atrium: The atrium was severely dilated. - Right ventricle: The cavity size was normal. Wall thickness was   normal. Systolic function was mildly reduced. - Atrial septum: No defect or patent foramen ovale was identified   by color flow Doppler. - Tricuspid valve: There was  mild regurgitation. - Pulmonary arteries: Systolic pressure was within the normal   range. PA peak pressure: 26 mm Hg (S).  Dr. Olin Pia note mention's Recent nuclear imaging (3/11) listed under NOTES-CV procedure--demonstrated ejection fraction of 30% with an infarct and no ischemia  she is a prior inferior wall MI he treated with stenting.   Recent Labs: 09/22/2016: TSH 1.55 02/27/2017: Hemoglobin 13.1; Platelets 273 03/02/2017: ALT 15 03/23/2017: BUN 6; Creat 0.72; Magnesium 1.8; Potassium 4.1; Sodium 138  02/23/2017: Cholesterol 147; HDL 33; LDL Cholesterol 97; Total CHOL/HDL Ratio 4.5; Triglycerides 87; VLDL 17   CrCl cannot be calculated (Patient's most recent lab result is older than the maximum 21 days allowed.).   Wt Readings from Last 3 Encounters:  06/18/17 206 lb (93.4 kg)  Day/28/18 210 lb (95.3 kg)  03/24/17 208 lb 12.8 oz (94.7 kg)     Other studies reviewed: Additional studies/records reviewed today include: summarized above  ASSESSMENT AND PLAN:  1. Recurrent VT requiring ATP     Baseline BP about 100 SBP by home reports, was low at her last visit, better today     Will start Mexiletine 200mg  BID at Dr. Olin Pia recommendation, discussed with the patient to let us know if cost is significant and we can change to amiodarone.      Clear increase in her NSVT episodes staring back in August  Discussed with her [place] law, no driving 6 months, she was aware.     2. ICD     Battery is stable, intact function  3. NICM/ICM     She was recently overloaded, she knew feeling bloated/hands swollen, took 3 days of lasix with resolution and optivol back below threshold     She is very liberal with her sodium, does not weigh daily     Lengthy discussion on the importance of this     She has been up/down more so since August it appears then historically as well, potentially a trigger for her VT  3. CAD      She is on ASA as well as plavix she states since her PCI she says about  10 years ago      she does not have any ischemic sounding symptoms at all  Given her VT I suggest we do a stress test though she declines, I also suggest thought on stopping Plavix, though she is very reluctant and given VT, will hold off on this       She is on BB, statin tx, tells me her PMD monitors her lipids closely and reportedly well controlled       I introduced the idea of referral to general cardiology for CAD management but she does not want to consider that for now   4. Chronic CHF (systolic)     Her exam is euvolemic, OptiVol is back down     She tells me on average uses lasix about once a month for 3 days     As noted above, liberal with sodium     She is on BB, no ARB/ACE,  BP may be prohibative, hx of lupus though no renal issues known for her    Disposition: 1 month, sooner if needed    Current medicines are reviewed at length with the patient today.  The patient did not have any concerns regarding medicines.  Venetia Night, PA-C 06/18/2017 4:12 PM     [place]  [phone] (office)  [phone] (fax)

## 2017-06-18 NOTE — Patient Instructions (Addendum)
Medication Instructions:   START TAKING MEXILETINE 200 MG TWICE DAY   If you need a refill on your cardiac medications before your next appointment, please call your pharmacy.  Labwork: NONE ORDERED  TODAY   Testing/Procedures: NONE ORDERED  TODAY    Follow-Up:  IN ONE MONTH WITH URSUY OR KLEIN   Any Other Special Instructions Will Be Listed Below (If Applicable).  PLEASE REMEMBER NO DRIVING FOR  6 MONTHS

## 2017-06-19 ENCOUNTER — Telehealth: Payer: Self-pay | Admitting: *Deleted

## 2017-06-19 ENCOUNTER — Telehealth: Payer: Self-pay | Admitting: Physician Assistant

## 2017-06-19 ENCOUNTER — Other Ambulatory Visit: Payer: Self-pay | Admitting: *Deleted

## 2017-06-19 MED ORDER — MEXILETINE HCL 200 MG PO CAPS: 200.0000 mg | ORAL_CAPSULE | Freq: Two times a day (BID) | ORAL | 3 refills | Status: DC

## 2017-06-19 NOTE — Telephone Encounter (Signed)
-----   Message from [place], [place] sent at 06/19/2017  7:01 AM EDT ----- This patient's mexiletine was to be written for 200mg  BID NOT three times daily, please call the patient and pharmacy 1st this this morning and correct.  I called pharmacy this miorning, it is closed.  I left a message for patient to call this morning to clarify instructions for her new medicine.    Thanks renee

## 2017-06-19 NOTE — Telephone Encounter (Signed)
SPOKE TO DAUGHTER TO MAKE SURE HER MOM TAKE MEXILETINE TWICE A DAY.

## 2017-06-19 NOTE — Telephone Encounter (Signed)
Called the patient and left a message for her to call the office this morning to clarify she received the correct instructions for new medicine prescribed yesterday.  Called pharmacy was closed. Left staff message for CMA to call patient and pharmacy this morning  I will follow up Tommye Standard, [facility]

## 2017-06-19 NOTE — Telephone Encounter (Signed)
SPOKE WITH PT AND AWARE OF MEDICATIONS  AMOUNT AND COST .  PT ALSO STATED NEW  MEDICATION  (MEXITIL)  ONLY  $ 3.35 A MONTH  AND SHE CAN AFFORD IT.

## 2017-06-19 NOTE — Telephone Encounter (Signed)
New message ° ° ° ° °Patient returning call. Please call °

## 2017-06-23 ENCOUNTER — Encounter: Payer: Self-pay | Admitting: Cardiology

## 2017-06-25 ENCOUNTER — Telehealth: Payer: Self-pay | Admitting: Cardiology

## 2017-06-25 NOTE — Telephone Encounter (Signed)
Spoke with pt and reminded pt of remote transmission that is due today. Pt verbalized understanding.   

## 2017-06-29 ENCOUNTER — Encounter (HOSPITAL_COMMUNITY): Payer: Self-pay | Admitting: *Deleted

## 2017-06-29 ENCOUNTER — Emergency Department (HOSPITAL_COMMUNITY): Payer: Medicare Other

## 2017-06-29 ENCOUNTER — Observation Stay (HOSPITAL_COMMUNITY)
Admission: EM | Admit: 2017-06-29 | Discharge: 2017-06-30 | Disposition: A | Discharge status: Home / Self Care | Payer: Medicare Other | Attending: Family Medicine | Admitting: Family Medicine

## 2017-06-29 ENCOUNTER — Other Ambulatory Visit: Payer: Self-pay

## 2017-06-29 DIAGNOSIS — Z95810 Presence of automatic (implantable) cardiac defibrillator: Secondary | ICD-10-CM | POA: Diagnosis not present

## 2017-06-29 DIAGNOSIS — I10 Essential (primary) hypertension: Secondary | ICD-10-CM | POA: Diagnosis not present

## 2017-06-29 DIAGNOSIS — I5022 Chronic systolic (congestive) heart failure: Secondary | ICD-10-CM

## 2017-06-29 DIAGNOSIS — R072 Precordial pain: Secondary | ICD-10-CM | POA: Diagnosis not present

## 2017-06-29 DIAGNOSIS — I5023 Acute on chronic systolic (congestive) heart failure: Secondary | ICD-10-CM | POA: Insufficient documentation

## 2017-06-29 DIAGNOSIS — Z79899 Other long term (current) drug therapy: Secondary | ICD-10-CM | POA: Insufficient documentation

## 2017-06-29 DIAGNOSIS — I519 Heart disease, unspecified: Secondary | ICD-10-CM | POA: Diagnosis not present

## 2017-06-29 DIAGNOSIS — E785 Hyperlipidemia, unspecified: Secondary | ICD-10-CM | POA: Diagnosis not present

## 2017-06-29 DIAGNOSIS — K219 Gastro-esophageal reflux disease without esophagitis: Secondary | ICD-10-CM | POA: Diagnosis not present

## 2017-06-29 DIAGNOSIS — I472 Ventricular tachycardia, unspecified: Secondary | ICD-10-CM

## 2017-06-29 DIAGNOSIS — E119 Type 2 diabetes mellitus without complications: Secondary | ICD-10-CM | POA: Insufficient documentation

## 2017-06-29 DIAGNOSIS — R0602 Shortness of breath: Secondary | ICD-10-CM | POA: Diagnosis not present

## 2017-06-29 DIAGNOSIS — R9439 Abnormal result of other cardiovascular function study: Secondary | ICD-10-CM | POA: Diagnosis not present

## 2017-06-29 DIAGNOSIS — E118 Type 2 diabetes mellitus with unspecified complications: Secondary | ICD-10-CM | POA: Diagnosis not present

## 2017-06-29 DIAGNOSIS — F17210 Nicotine dependence, cigarettes, uncomplicated: Secondary | ICD-10-CM | POA: Insufficient documentation

## 2017-06-29 DIAGNOSIS — R110 Nausea: Secondary | ICD-10-CM | POA: Diagnosis not present

## 2017-06-29 DIAGNOSIS — I255 Ischemic cardiomyopathy: Secondary | ICD-10-CM | POA: Diagnosis present

## 2017-06-29 DIAGNOSIS — E8881 Metabolic syndrome: Secondary | ICD-10-CM

## 2017-06-29 DIAGNOSIS — R079 Chest pain, unspecified: Secondary | ICD-10-CM | POA: Diagnosis not present

## 2017-06-29 DIAGNOSIS — I714 Abdominal aortic aneurysm, without rupture: Secondary | ICD-10-CM | POA: Diagnosis not present

## 2017-06-29 DIAGNOSIS — K760 Fatty (change of) liver, not elsewhere classified: Secondary | ICD-10-CM | POA: Diagnosis not present

## 2017-06-29 DIAGNOSIS — F5105 Insomnia due to other mental disorder: Secondary | ICD-10-CM

## 2017-06-29 DIAGNOSIS — F17218 Nicotine dependence, cigarettes, with other nicotine-induced disorders: Secondary | ICD-10-CM | POA: Diagnosis not present

## 2017-06-29 DIAGNOSIS — R0789 Other chest pain: Secondary | ICD-10-CM | POA: Diagnosis not present

## 2017-06-29 DIAGNOSIS — I4729 Other ventricular tachycardia: Secondary | ICD-10-CM

## 2017-06-29 DIAGNOSIS — I4901 Ventricular fibrillation: Secondary | ICD-10-CM | POA: Diagnosis present

## 2017-06-29 DIAGNOSIS — F17200 Nicotine dependence, unspecified, uncomplicated: Secondary | ICD-10-CM | POA: Diagnosis present

## 2017-06-29 DIAGNOSIS — R7302 Impaired glucose tolerance (oral): Secondary | ICD-10-CM | POA: Diagnosis present

## 2017-06-29 DIAGNOSIS — F418 Other specified anxiety disorders: Secondary | ICD-10-CM | POA: Diagnosis present

## 2017-06-29 LAB — TROPONIN I: Troponin I: <0.03 ng/mL (ref <0.03)

## 2017-06-29 LAB — BASIC METABOLIC PANEL
ANION GAP: 6 (ref 5–15)
BUN: 9 mg/dL (ref 6–20)
CALCIUM: 8.9 mg/dL (ref 8.9–10.3)
CO2: 24 mmol/L (ref 22–32)
Chloride: 106 mmol/L (ref 101–111)
Creatinine, Ser: 0.72 mg/dL (ref 0.44–1.00)
Glucose, Bld: 105 mg/dL — ABNORMAL HIGH (ref 65–99)
Potassium: 3.4 mmol/L — ABNORMAL LOW (ref 3.5–5.1)
Sodium: 136 mmol/L (ref 135–145)

## 2017-06-29 LAB — LIPID PANEL
CHOL/HDL RATIO: 3.8 ratio
CHOLESTEROL: 106 mg/dL (ref 0–200)
HDL: 28 mg/dL — AB (ref >40)
LDL Cholesterol: 63 mg/dL (ref 0–99)
Triglycerides: 75 mg/dL (ref <150)
VLDL: 15 mg/dL (ref 0–40)

## 2017-06-29 LAB — I-STAT TROPONIN, ED: TROPONIN I, POC: 0 ng/mL (ref 0.00–0.08)

## 2017-06-29 LAB — CBC
HCT: 38 % (ref 36.0–46.0)
HEMOGLOBIN: 12.6 g/dL (ref 12.0–15.0)
MCH: 27.6 pg (ref 26.0–34.0)
MCHC: 33.2 g/dL (ref 30.0–36.0)
MCV: 83.2 fL (ref 78.0–100.0)
PLATELETS: 305 10*3/uL (ref 150–400)
RBC: 4.57 MIL/uL (ref 3.87–5.11)
RDW: 13.3 % (ref 11.5–15.5)
WBC: 8.1 10*3/uL (ref 4.0–10.5)

## 2017-06-29 LAB — HEMOGLOBIN A1C
HEMOGLOBIN A1C: 6.1 % — AB (ref 4.8–5.6)
Mean Plasma Glucose: 128.37 mg/dL

## 2017-06-29 LAB — GLUCOSE, CAPILLARY: Glucose-Capillary: 143 mg/dL — ABNORMAL HIGH (ref 65–99)

## 2017-06-29 MED ORDER — ALBUTEROL SULFATE HFA 108 (90 BASE) MCG/ACT IN AERS: 2.0000 | INHALATION_SPRAY | Freq: Four times a day (QID) | RESPIRATORY_TRACT | Status: DC | PRN

## 2017-06-29 MED ORDER — GUAIFENESIN-DM 100-10 MG/5ML PO SYRP
10.0000 mL | ORAL_SOLUTION | ORAL | Status: DC | PRN
  Administered 2017-06-29 – 2017-06-30 (×2): 10 mL via ORAL
  Filled 2017-06-29 (×2): qty 10

## 2017-06-29 MED ORDER — PANTOPRAZOLE SODIUM 40 MG PO TBEC
40.0000 mg | DELAYED_RELEASE_TABLET | Freq: Two times a day (BID) | ORAL | Status: DC
  Administered 2017-06-29 – 2017-06-30 (×3): 40 mg via ORAL
  Filled 2017-06-29 (×3): qty 1

## 2017-06-29 MED ORDER — ATORVASTATIN CALCIUM 10 MG PO TABS
10.0000 mg | ORAL_TABLET | Freq: Every day | ORAL | Status: DC
  Administered 2017-06-29: 10 mg via ORAL
  Filled 2017-06-29: qty 1

## 2017-06-29 MED ORDER — NITROGLYCERIN 0.4 MG SL SUBL
0.4000 mg | SUBLINGUAL_TABLET | Freq: Once | SUBLINGUAL | Status: AC
  Administered 2017-06-29: 0.4 mg via SUBLINGUAL
  Filled 2017-06-29: qty 1

## 2017-06-29 MED ORDER — ALBUTEROL SULFATE (2.5 MG/3ML) 0.083% IN NEBU
2.5000 mg | INHALATION_SOLUTION | Freq: Four times a day (QID) | RESPIRATORY_TRACT | Status: DC | PRN
  Administered 2017-06-29: 2.5 mg via RESPIRATORY_TRACT
  Filled 2017-06-29: qty 3

## 2017-06-29 MED ORDER — MORPHINE SULFATE (PF) 4 MG/ML IV SOLN
4.0000 mg | Freq: Once | INTRAVENOUS | Status: AC
  Administered 2017-06-29: 4 mg via INTRAVENOUS
  Filled 2017-06-29: qty 1

## 2017-06-29 MED ORDER — ASPIRIN 81 MG PO CHEW
324.0000 mg | CHEWABLE_TABLET | Freq: Once | ORAL | Status: AC
  Administered 2017-06-29: 324 mg via ORAL
  Filled 2017-06-29: qty 4

## 2017-06-29 MED ORDER — ENOXAPARIN SODIUM 40 MG/0.4ML ~~LOC~~ SOLN
40.0000 mg | SUBCUTANEOUS | Status: DC
  Administered 2017-06-29 – 2017-06-30 (×2): 40 mg via SUBCUTANEOUS
  Filled 2017-06-29 (×2): qty 0.4

## 2017-06-29 MED ORDER — IPRATROPIUM-ALBUTEROL 0.5-2.5 (3) MG/3ML IN SOLN: 3.0000 mL | RESPIRATORY_TRACT | Status: DC | PRN

## 2017-06-29 MED ORDER — MOMETASONE FURO-FORMOTEROL FUM 100-5 MCG/ACT IN AERO
2.0000 | INHALATION_SPRAY | Freq: Two times a day (BID) | RESPIRATORY_TRACT | Status: DC
  Administered 2017-06-29 – 2017-06-30 (×3): 2 via RESPIRATORY_TRACT
  Filled 2017-06-29: qty 8.8

## 2017-06-29 MED ORDER — IOPAMIDOL (ISOVUE-370) INJECTION 76%
100.0000 mL | Freq: Once | INTRAVENOUS | Status: AC | PRN
  Administered 2017-06-29: 100 mL via INTRAVENOUS

## 2017-06-29 MED ORDER — MAGNESIUM OXIDE 400 (241.3 MG) MG PO TABS
400.0000 mg | ORAL_TABLET | Freq: Once | ORAL | Status: AC
  Administered 2017-06-29: 400 mg via ORAL
  Filled 2017-06-29: qty 1

## 2017-06-29 MED ORDER — CARVEDILOL 12.5 MG PO TABS
12.5000 mg | ORAL_TABLET | Freq: Two times a day (BID) | ORAL | Status: DC
  Administered 2017-06-30: 12.5 mg via ORAL
  Filled 2017-06-29: qty 1

## 2017-06-29 MED ORDER — INFLUENZA VAC SPLIT QUAD 0.5 ML IM SUSY
0.5000 mL | PREFILLED_SYRINGE | INTRAMUSCULAR | Status: DC
  Filled 2017-06-29: qty 0.5

## 2017-06-29 MED ORDER — MORPHINE SULFATE (PF) 2 MG/ML IV SOLN
2.0000 mg | INTRAVENOUS | Status: DC | PRN
  Administered 2017-06-29: 2 mg via INTRAVENOUS
  Filled 2017-06-29: qty 1

## 2017-06-29 MED ORDER — ALPRAZOLAM 1 MG PO TABS: 1.0000 mg | ORAL_TABLET | Freq: Every evening | ORAL | Status: DC | PRN

## 2017-06-29 MED ORDER — ACETAMINOPHEN 325 MG PO TABS
650.0000 mg | ORAL_TABLET | ORAL | Status: DC | PRN
  Administered 2017-06-29: 650 mg via ORAL
  Filled 2017-06-29: qty 2

## 2017-06-29 MED ORDER — NITROGLYCERIN 0.4 MG SL SUBL: 0.4000 mg | SUBLINGUAL_TABLET | SUBLINGUAL | Status: DC | PRN

## 2017-06-29 MED ORDER — MEXILETINE HCL 200 MG PO CAPS
200.0000 mg | ORAL_CAPSULE | Freq: Two times a day (BID) | ORAL | Status: DC
  Administered 2017-06-29: 200 mg via ORAL
  Filled 2017-06-29 (×5): qty 1

## 2017-06-29 MED ORDER — ONDANSETRON HCL 4 MG/2ML IJ SOLN: 4.0000 mg | Freq: Four times a day (QID) | INTRAMUSCULAR | Status: DC | PRN

## 2017-06-29 MED ORDER — REGADENOSON 0.4 MG/5ML IV SOLN
0.4000 mg | Freq: Once | INTRAVENOUS | Status: AC
  Administered 2017-06-30: 0.4 mg via INTRAVENOUS
  Filled 2017-06-29: qty 5

## 2017-06-29 MED ORDER — ASPIRIN EC 81 MG PO TBEC
81.0000 mg | DELAYED_RELEASE_TABLET | Freq: Every day | ORAL | Status: DC
  Administered 2017-06-29 – 2017-06-30 (×2): 81 mg via ORAL
  Filled 2017-06-29 (×2): qty 1

## 2017-06-29 MED ORDER — CLOPIDOGREL BISULFATE 75 MG PO TABS
75.0000 mg | ORAL_TABLET | Freq: Every day | ORAL | Status: DC
  Administered 2017-06-29 – 2017-06-30 (×2): 75 mg via ORAL
  Filled 2017-06-29 (×2): qty 1

## 2017-06-29 MED ORDER — POTASSIUM CHLORIDE CRYS ER 20 MEQ PO TBCR
20.0000 meq | EXTENDED_RELEASE_TABLET | Freq: Once | ORAL | Status: AC
  Administered 2017-06-29: 20 meq via ORAL
  Filled 2017-06-29: qty 1

## 2017-06-29 NOTE — Consult Note (Signed)
Cardiology Consultation:   Patient ID: [id number] CARTWRIGHT; [id number]; 03/09/1966   Admit date: 06/29/2017 Date of Consult: 06/29/2017  Primary Care Provider: Fayrene Helper, MD Primary Cardiologist: New Primary Electrophysiologist:  Virl Axe MD   Patient Profile:   [name] is a 51 y.o. female with a hx of recurrent VT, status post ICD in situ, coronary artery disease with history of PCI to the right coronary artery, ischemic cardiomyopathy most recent echo March 19, 2017 revealing an EF of 20% to 25%, with grade 2 diastolic dysfunction, chronic mixed CHF, diabetes, and lupus who is being seen today for the evaluation of chest pain at the request of Dr. Wynetta Emery, hospitalist service.  History of Present Illness:   Ms. Graber presented to the emergency room with complaints of severe chest discomfort.  Patient was recently started on mexietine 200 mg BID iby Dr. Caryl Comes with consideration for amiodarone if this was cost prohibitive.. This was instituted in the setting of abnormal [facility] transmission which noted VT episodes that required ATP therapy.    The patient states she did not begin taking mexiletine until 1 week ago.  Patient states that it was prescribed by Dr. Caryl Comes, but her pharmacy had to order it and therefore she had to wait to begin taking it.  After starting this medication she had been having some intermittent discomfort in her chest.  She was sitting in bed watching television in the early morning hours, last night, when she began to have severe chest pressure.  On presentation to the emergency room blood pressure 134/95, heart rate 84, O2 sat 99% she was afebrile.  Pertinent labs revealed a potassium 3.4, glucose 105.  CBC was normal.  Troponin 0 0.03.  Total cholesterol 106, HDL 28, LDL 63, triglycerides 75.  EKG revealed sinus rhythm with LVH.  CT revealed atherosclerotic changes in the proximal descending aorta, no dissection or aneurysm, atherosclerosis  in the distal aorta and proximal iliac vessels without stenosis, coronary artery disease, groundglass attenuation in the upper lobes bilaterally atelectasis versus edema.  Chest x-ray revealed cardiomegaly, prominent bronchovascular structures with possible pulmonary edema, bronchitis, or reactive airway disease.  Treated with nitroglycerin sublingual which relieved her discomfort.  She believes that is the new medication that is causing her chest discomfort.  She denies dizziness, she did have some shortness of breath, no nausea vomiting or diaphoresis.  She has had no further chest discomfort since admission.  She did take her dose of Mexiletine this a.m., but nurse states carvedilol was by her nurse, as they thought she may have to have a stress test.  Past Medical History:  Diagnosis Date  . Arthritis of knee   . Automatic implantable cardiac defibrillator in situ    a. s/p prior Medtronic ICD with 6949 lead. b. s/p Gen change & lead revision 05/2013.  . Cardiomyopathy secondary    Patient has cardiomyopathy out of proportion to her ischemic heart disease  . coronary artery disease    RCA stenting Myoview 2011 EF 30% infarction dilatation without ischemia  . Depression   . Diabetes mellitus   . GERD (gastroesophageal reflux disease)   . Insomnia   . Lupus (systemic lupus erythematosus) ([place])   . Migraines   . Myocardial infarction ([place])    8 total   . Nicotine abuse   . Other and unspecified hyperlipidemia   . Paroxysmal VT ([place])   . Systolic CHF ([place])   . VF (ventricular fibrillation) ([place])  a. Hx appropriate ICD therapy for VF.    Past Surgical History:  Procedure Laterality Date  . ABDOMINAL HYSTERECTOMY    . CHOLECYSTECTOMY    . defibrillator placed   2009 and 05/2013  . ICD GENERATOR CHANGE  06/17/2013   Dr Caryl Comes  . ROTATOR CUFF REPAIR Right 2002     Home Medications:  Prior to Admission medications   Medication Sig Start Date End Date Taking? Authorizing Provider    albuterol (PROVENTIL HFA;VENTOLIN HFA) 108 (90 Base) MCG/ACT inhaler Inhale 2 puffs into the lungs every 6 (six) hours as needed for wheezing or shortness of breath. 03/13/16   Fayrene Helper, MD  ALPRAZolam Duanne Moron) 1 MG tablet TAKE 1 TABLET BY MOUTH AT BEDTIME AS NEEDED FOR ANXIETY. 03/02/17   Fayrene Helper, MD  aspirin 81 MG tablet Take 1 tablet (81 mg total) by mouth daily. 09/26/13   Deboraha Sprang, MD  Aspirin-Acetaminophen (GOODYS BODY PAIN PO) Take 1 Package by mouth daily as needed. For pain    [provider]  atorvastatin (LIPITOR) 10 MG tablet Take 1 tablet (10 mg total) by mouth daily. 03/18/16   Fayrene Helper, MD  budesonide-formoterol (SYMBICORT) 80-4.5 MCG/ACT inhaler Inhale 2 puffs into the lungs 2 (two) times daily. 04/02/16   Fayrene Helper, MD  carvedilol (COREG) 12.5 MG tablet Take 1 tablet (12.5 mg total) by mouth 2 (two) times daily. 05/15/17 08/13/17  Deboraha Sprang, MD  clopidogrel (PLAVIX) 75 MG tablet Take 1 tablet (75 mg total) by mouth daily. 06/05/17   Deboraha Sprang, MD  cyclobenzaprine (FLEXERIL) 5 MG tablet TAKE ONE TABLET BY MOUTH TWICE DAILY AS NEEDED FOR LEFT NECK SPASM AND ARM PAIN. 05/04/17   Fayrene Helper, MD  ergocalciferol (VITAMIN D2) 50000 units capsule Take 1 capsule (50,000 Units total) by mouth once a week. One capsule once weekly 01/17/16   Fayrene Helper, MD  fenofibrate (TRICOR) 145 MG tablet Take 145 mg by mouth daily.  04/15/13   [provider]  furosemide (LASIX) 20 MG tablet Take 2 tablets (40 mg total) by mouth as needed for fluid and edema 12/16/16   Deboraha Sprang, MD  mexiletine (MEXITIL) 200 MG capsule Take 1 capsule (200 mg total) by mouth 2 (two) times daily. 06/19/17   Baldwin Jamaica, PA-C  oxyCODONE (ROXICODONE) 15 MG immediate release tablet Take 30 mg by mouth 3 (three) times daily. 04/10/16   [provider]  potassium chloride SA (K-DUR,KLOR-CON) 20 MEQ tablet Take 1 tablet (20 mEq  total) by mouth on days when Furosemide is taken. Patient taking differently: Take 20 mEq by mouth daily. Take 1 tablet (20 mEq total) by mouth on days when Furosemide is taken. 12/16/16   Deboraha Sprang, MD    Inpatient Medications: Scheduled Meds: . aspirin EC  81 mg Oral Daily  . atorvastatin  10 mg Oral q1800  . [START ON 06/30/2017] carvedilol  12.5 mg Oral BID  . clopidogrel  75 mg Oral Daily  . enoxaparin (LOVENOX) injection  40 mg Subcutaneous Q24H  . mexiletine  200 mg Oral BID  . mometasone-formoterol  2 puff Inhalation BID  . pantoprazole  40 mg Oral BID   Continuous Infusions:  PRN Meds: acetaminophen, albuterol, ALPRAZolam, morphine injection, ondansetron (ZOFRAN) IV  Allergies:    Allergies  Allergen Reactions  . Bee Venom Anaphylaxis  . Cortisone Swelling  . Robaxin [Methocarbamol] Hives and Itching    Social History:  Social History   Socioeconomic History  . Marital status: Single    Spouse name: Not on file  . Number of children: 3  . Years of education: Not on file  . Highest education level: Not on file  Social Needs  . Financial resource strain: Not on file  . Food insecurity - worry: Not on file  . Food insecurity - inability: Not on file  . Transportation needs - medical: Not on file  . Transportation needs - non-medical: Not on file  Occupational History  . Occupation: disabled    Fish farm manager: UNEMPLOYED  Tobacco Use  . Smoking status: Current Every Day Smoker    Packs/day: 0.50    Years: 15.00    Pack years: 7.50    Types: Cigarettes  . Smokeless tobacco: Never Used  Substance and Sexual Activity  . Alcohol use: Yes    Alcohol/week: 0.0 oz    Comment: occasionaly  . Drug use: No  . Sexual activity: Not Currently    Birth control/protection: Surgical  Other Topics Concern  . Not on file  Social History Narrative  . Not on file    Family History:    Family History  Problem Relation Age of Onset  . Hepatitis Mother 57       HCV    . Liver cancer Mother 53  . Cancer Mother   . Diabetes Mother   . Heart disease Mother   . Hyperlipidemia Mother   . Stroke Sister 45       x3  . Diabetes Sister   . Hyperlipidemia Sister   . Dementia Father   . HIV Sister   . Diabetes Brother   . Heart disease Brother   . Hyperlipidemia Brother      ROS:  Please see the history of present illness.  ROS  All other ROS reviewed and negative.     Physical Exam/Data:   Vitals:   06/29/17 0700 06/29/17 0730 06/29/17 0800 06/29/17 0826  BP: 125/89 117/82 119/89 121/83  Pulse: 75 74 77 78  Resp: 16 (!) 25 (!) 22 16  Temp:    (!) 97.4 F (36.3 C)  TempSrc:    Oral  SpO2: 96% 92% 95% 95%  Weight:    209 lb 12.8 oz (95.2 kg)  Height:    5\' 6"  (1.676 m)    Intake/Output Summary (Last 24 hours) at 06/29/2017 0947 Last data filed at 06/29/2017 0923 Gross per 24 hour  Intake 240 ml  Output -  Net 240 ml   Filed Weights   06/29/17 0503 06/29/17 0826  Weight: 206 lb (93.4 kg) 209 lb 12.8 oz (95.2 kg)   Body mass index is 33.86 kg/m.  General:  Well nourished, well developed, in no acute distress HEENT: normal Lymph: no adenopathy Neck: no JVD Endocrine:  No thryomegaly Vascular: No carotid bruits; FA pulses 2+ bilaterally without bruits  Cardiac:  normal S1, S2; 2/6 systolic murmur RRR;  Lungs:  Clear to auscultation bilaterally, no wheezing, rhonchi or rales  Abd: soft, nontender, no hepatomegaly  Ext: no edema Musculoskeletal:  No deformities, BUE and BLE strength normal and equal Skin: warm and dry  Neuro:  CNs 2-12 intact, no focal abnormalities noted Psych:  Normal affect   EKG:  The EKG was personally reviewed and demonstrates: Sinus rhythm incomplete right bundle branch block with LVH. Telemetry:  Telemetry was personally reviewed and demonstrates: Sinus rhythm with LVH, occasional PVCs  Relevant CV Studies: Echocardiogram 03/31/2017 Left ventricle:  The cavity size was moderately dilated. There was    moderate concentric hypertrophy. Systolic function was severely   reduced. The estimated ejection fraction was in the range of 20%   to 25%. Diffuse hypokinesis. Features are consistent with a   pseudonormal left ventricular filling pattern, with concomitant   abnormal relaxation and increased filling pressure (grade 2   diastolic dysfunction). Doppler parameters are consistent with   high ventricular filling pressure. - Aortic valve: Transvalvular velocity was within the normal range.   There was no stenosis. There was no regurgitation. - Mitral valve: Transvalvular velocity was within the normal range.   There was no evidence for stenosis. There was moderate   regurgitation. - Left atrium: The atrium was severely dilated. - Right ventricle: The cavity size was normal. Wall thickness was   normal. Systolic function was mildly reduced. - Atrial septum: No defect or patent foramen ovale was identified   by color flow Doppler. - Tricuspid valve: There was mild regurgitation. - Pulmonary arteries: Systolic pressure was within the normal   range. PA peak pressure: 26 mm Hg (S).  ICD Interrogation: 06/15/2017 Indications   Ischemic cardiomyopathy [I25.5 (FGH-82-XH)]  Chronic systolic CHF (congestive heart failure) ([place]) [I50.22 (ICD-10-CM)]  ICD (implantable cardioverter-defibrillator) infection, initial encounter ([place]) [B71.7XXA (ICD-10-CM)]  Conclusion   Abnormal remote check.  (3) VT episodes treated successfully w/ATP. + Coreg 12.5BID. (595) VT-NS episodes. + ICM clinic.    Laboratory Data:  Chemistry Recent Labs  Lab 06/29/17 0508  NA 136  K 3.4*  CL 106  CO2 24  GLUCOSE 105*  BUN 9  CREATININE 0.72  CALCIUM 8.9  GFRNONAA >60  GFRAA >60  ANIONGAP 6    Hematology Recent Labs  Lab 06/29/17 0508  WBC 8.1  RBC 4.57  HGB 12.6  HCT 38.0  MCV 83.2  MCH 27.6  MCHC 33.2  RDW 13.3  PLT 305   Cardiac Enzymes Recent Labs  Lab 06/29/17 0844  TROPONINI <0.03      Recent Labs  Lab 06/29/17 0511  TROPIPOC 0.00    BNPNo results for input(s): BNP, PROBNP in the last 168 hours.  DDimer No results for input(s): DDIMER in the last 168 hours.  Radiology/Studies:  Dg Chest 2 View  Result Date: 06/29/2017 CLINICAL DATA:  Mid to upper chest pain. History of lupus, cardiomyopathy, diabetes, CHF. EXAM: CHEST  2 VIEW COMPARISON:  Chest radiograph February 27, 2017 FINDINGS: Cardiac silhouette is mildly enlarged. Mediastinal silhouette is nonsuspicious. Prominent bronchovascular structures without pleural effusion or focal consolidation. LEFT cardiac defibrillator in situ. No pneumothorax. Soft tissue planes and included osseous structures are nonsuspicious. Surgical clips in the included right abdomen compatible with cholecystectomy. IMPRESSION: Mild cardiomegaly. Prominent bronchovascular structures seen with pulmonary edema, bronchitis or reactive airway disease. No focal consolidation. Electronically Signed   By: Elon Alas M.D.   On: 06/29/2017 06:17   Ct Angio Chest/abd/pel For Dissection W And/or Wo Contrast  Result Date: 06/29/2017 CLINICAL DATA:  Sharp central chest pain and pressure or. Pain is constant. Pain extends into the upper back between shoulders. Hemodynamically stable. EXAM: CT ANGIOGRAPHY CHEST, ABDOMEN AND PELVIS TECHNIQUE: Multidetector CT imaging through the chest, abdomen and pelvis was performed using the standard protocol during bolus administration of intravenous contrast. Multiplanar reconstructed images and MIPs were obtained and reviewed to evaluate the vascular anatomy. CONTRAST:  115mL ISOVUE-370 IOPAMIDOL (ISOVUE-370) INJECTION 76% COMPARISON:  Two-view chest x-ray from the same day. CT of the abdomen and pelvis 12/05/2016. FINDINGS:  CTA CHEST FINDINGS Cardiovascular: The heart is enlarged. Two right ventricular leads are in place. Coronary artery calcifications are present. No significant pericardial effusion is present. Scattered  aortic calcifications are present. There are no displaced calcifications. Eccentric soft tissue plaque is present along the lateral aspect of the descending aorta. Adjacent calcification is present. There is no false lumen or dissection. Additional eccentric plaque is present in the descending aorta. There is no aneurysm of the thoracic aorta. Mediastinum/Nodes: No significant mediastinal or axillary adenopathy is present. The esophagus is dilated without mass. Pulmonary artery is are within normal limits. Lungs/Pleura: Mild ground-glass attenuation is present in the upper lobes bilaterally. No focal nodule, mass, or other significant airspace disease is present. There is no significant pleural effusion. Musculoskeletal: 12 rib-bearing thoracic type vertebral bodies are present. Vertebral body heights and alignment are normal. No focal lytic or blastic lesions are present. Ribs are within normal limits. Levels are intact bilaterally. The visualized scapula are unremarkable. Review of the MIP images confirms the above findings. CTA ABDOMEN AND PELVIS FINDINGS VASCULAR Aorta: The aorta is of normal size throughout the abdomen. Distal atherosclerotic changes are present without significant stenosis or aneurysm. Celiac: No significant stenosis.  Normal branch pattern. SMA: Patent without evidence of aneurysm, dissection, vasculitis or significant stenosis. Renals: Both renal arteries are patent without evidence of aneurysm, dissection, vasculitis, fibromuscular dysplasia or significant stenosis. IMA: Patent without evidence of aneurysm, dissection, vasculitis or significant stenosis. Inflow: Patent without evidence of aneurysm, dissection, vasculitis or significant stenosis. Veins: No obvious venous abnormality within the limitations of this arterial phase study. Review of the MIP images confirms the above findings. NON-VASCULAR Hepatobiliary: An 8 mm arterially enhancing lesion is present anteriorly within the left  lobe of the liver. A similar lesion is present more laterally in the right lobe of the liver. Both lesions are seen on venous phase imaging during previous study. Both lesions than normalize on delayed images. These lesions are stable back to 2013. Common bile duct is within normal limits following cholecystectomy. Pancreas: Unremarkable. No pancreatic ductal dilatation or surrounding inflammatory changes. Spleen: Normal in size without focal abnormality. Adrenals/Urinary Tract: The adrenal glands are normal bilaterally. Posterior right renal cyst is stable. Ureters and urinary bladder are within normal limits. Stomach/Bowel: The stomach and duodenum are within normal limits. Small bowel is unremarkable. Ileocolic junction is normal. The appendix is not discretely visualized and may be surgically absent. No inflammatory changes are present. The ascending and transverse colon are within normal limits. The descending colon is within normal limits. Diverticular present of the sigmoid colon. No significant inflammatory changes are present. Lymphatic: No significant adenopathy is present. Reproductive: Status post hysterectomy. No adnexal masses. Other: No abdominal wall hernia or abnormality. No abdominopelvic ascites. Musculoskeletal: Lumbar vertebral body heights are normal. Alignment is anatomic. Degenerative changes are noted at the SI joints bilaterally. Pelvis is otherwise unremarkable. The hips are located and within normal limits. Review of the MIP images confirms the above findings. IMPRESSION: 1. Eccentric atherosclerotic changes in the proximal descending aorta without evidence for dissection, aneurysm, or significant stenosis. 2. Aortic Atherosclerosis (ICD10-I70.0). Atherosclerotic changes in the distal aorta and proximal iliac vessels without significant stenosis or aneurysm. 3. Cardiomegaly without failure. 4. Coronary artery disease. 5. Ground-glass attenuation of the upper lobes bilaterally likely  reflects atelectasis or edema. No discrete airspace disease is present. 6. Benign-appearing lesions of the liver likely represent flash filling hemangiomas. These are stable and no follow-up is necessary. Electronically Signed  By: San Morelle M.D.   On: 06/29/2017 07:01    Assessment and Plan:   1.  Chest pain: Patient recently has been started on mexiletine, and is only been taking it for 1 week, in addition to her other cardiac medications which includes atenolol 12.5 mg twice daily aspirin grams daily Plavix, She states that she began to have discomfort in her chest after beginning this new medication approximately 1 week ago.  Coreg was held this morning by nurses,.  Troponin has been negative x2, EKG does not show acute ST-T wave abnormalities.  She has ruled out for ACS currently.  Concern for intolerance mexiletine.  He states Dr. Olin Pia office was supposed to call her back and let her know if she needed to remain on this along with carvedilol.  Chest discomfort begin with new medication. Consider stopping this and beginning amiodarone.   Will give one dose of potassium 40 mEq and one dose of magnesium 400 mg today with repeat labs in am. Plan Lexiscan stress myoview in am as requested by Dr. Bronson Ing.   2.  Ischemic cardiomyopathy: Most recent echocardiogram March 31, 2017 revealed a 20% to 25% with diffuse hypokinesis and grade 2 diastolic dysfunction.  Blood pressure is not optimal for current EF.  May need addition of ACE/ARB, and possibly diuretic.Marland KitchenRecommend that she have her coreg this am.   3.  History of coronary artery disease: PCI of the right coronary artery 2011.  Remains on dual antiplatelet therapy with aspirin and Plavix.  Troponin negative x2.  4.  Chronic combined systolic and diastolic heart failure: Chest X ray and CT scan is indeterminate for heart failure and/or pulmonary edema versus atelectasis.  On assessment there was no evidence of decompensation.  He is  currently not on a diuretic.  5.  ICD in situ: (Medtronic): Last interrogation completed 06/16/2017.  This revealed 3 VT episodes with ATP.  This was prior to adding Mexiletine    6. Diabetes;  Per PCP Team     For questions or updates, please contact [place] Please consult [email or web address] for contact info under Cardiology/STEMI.   Signed, Jory Sims, NP  06/29/2017 9:47 AM   The patient was seen and examined, and I agree with the history, physical exam, assessment and plan as documented above, with modifications as noted below. I have also personally reviewed all relevant documentation, old records, labs, and both radiographic and cardiovascular studies. I have also independently interpreted old and new ECG's.  The patient is a 51 year old woman with a history of coronary artery disease with PCI of the RCA several years ago, mixed ischemic and nonischemic cardiomyopathy with chronic systolic heart failure, type 2 diabetes mellitus, systemic lupus erythematosus, and ventricular tachycardia with ICD and lead revision.  She recently saw EP in the outpatient setting on 06/18/17.  She was initiated on mexiletine as device interrogation demonstrated recurrent VT with appropriate ATP therapies.  She started mexiletine exactly a week ago.  She had some bilateral hand tightness and swelling and took Lasix for 3 days and this resolved.  Yesterday while she was lying down, she began experiencing severe retrosternal chest pressure and pain radiating to her back.  She has not experienced this in the recent past.   No associated shortness of breath. She denies leg swelling, orthopnea, and paroxysmal nocturnal dyspnea.  Troponins have been negative thus far.  CT scan was negative for aortic dissection.  There were coronary artery and aortic atherosclerosis noted.  Pertinent labs: Mild  hypokalemia with potassium 3.4, normal lipids with LDL at goal at 63, normal troponins.  Chest x-ray  showed prominent bronchovascular structures.  ECG which I personally interpreted demonstrated sinus rhythm with LVH and consequent repolarization abnormalities and nonspecific periventricular conduction delay.  She has some mild residual chest pressure.  She had breakfast at 8 AM this morning. She is hemodynamic with stable.  Assessment and plan:  1.  Chest pressure in the context of coronary artery disease with prior RCA stenting: Given her symptoms with increasing episodes of ventricular tachycardia, stress testing is indicated.  Troponins have been normal and she has ruled out for acute coronary syndrome.   As she has eaten this morning, I will arrange for a [employer] tomorrow. She has a history of inferior wall MI and remains on aspirin, Plavix, Lipitor, and carvedilol.  Dual antiplatelet therapy is not indicated and apparently this has been discussed with her in the past but she was afraid to stop Plavix.  I am unclear why she is not on an ACE inhibitor or angiotensin receptor blocker given her history of MI and cardiomyopathy based on review of cardiology notes.  I do not see any allergies noted.  This can be deferred to the outpatient setting.  2.  Chronic systolic heart failure: She use Lasix for 3 days last week.  She does not appear to be in heart failure at this time.  Continue carvedilol.  I am unclear why she is not on an ACE inhibitor or angiotensin receptor blocker given her history of MI and cardiomyopathy based on review of cardiology notes.  I do not see any allergies noted.  This can be deferred to the outpatient setting.  3.  Ventricular tachycardia with ICD: She has been on mexiletine for a week.  She is also on carvedilol.  Higher doses of carvedilol led to fatigue based on a review of office notes.  I will assess for an ischemic etiology with a Lexiscan Myoview stress test tomorrow.  Potassium is mildly low at 3.4 and that should be greater than 4.  I will check a  magnesium level as this should be greater than 2.  4.  Hypertension: Blood pressure was elevated yesterday but is normal today.  Goal is 130/80 or less.  5.  Hypercholesterolemia: Continue Lipitor.  LDL is at goal as noted above.   Kate Sable, MD, [facility]  06/29/2017 11:16 AM

## 2017-06-29 NOTE — ED Provider Notes (Signed)
[facility] EMERGENCY DEPARTMENT Provider Note   CSN: [id number] Arrival date & time: 06/29/17  0456     History   Chief Complaint Chief Complaint  Patient presents with  . Chest Pain    HPI [name] is a 51 y.o. female.  51 y.o. female with history of recurrent VT w/ICD and appropriate therapies, CAD (hx of RCA PCI), ICM/NICM, chronic CHF, DM, Lupus presents with central chest pain that radiates to her back that started while she was resting in bed approximately 4 AM.  Reports the pain is constant associated with shortness of breath and nausea.  Nothing makes it better or worse.  She did not try to take anything for it at home.  Denies any abdominal pain or vomiting.  Reports she has had this pain before with her heart disease.  But not for a long time.  She does not know when her last stress test was.  Pain is constant and nothing makes it better.  She denies any syncope or diaphoresis.  She denies any recent AICD shocks.  She denies any leg pain or leg swelling.   The history is provided by the patient and a relative.  Chest Pain   Associated symptoms include shortness of breath. Pertinent negatives include no abdominal pain, no cough, no dizziness, no fever, no headaches, no nausea, no vomiting and no weakness.    Past Medical History:  Diagnosis Date  . Arthritis of knee   . Automatic implantable cardiac defibrillator in situ    a. s/p prior Medtronic ICD with 6949 lead. b. s/p Gen change & lead revision 05/2013.  . Cardiomyopathy secondary    Patient has cardiomyopathy out of proportion to her ischemic heart disease  . coronary artery disease    RCA stenting Myoview 2011 EF 30% infarction dilatation without ischemia  . Depression   . Diabetes mellitus   . GERD (gastroesophageal reflux disease)   . Insomnia   . Lupus (systemic lupus erythematosus) ([place])   . Migraines   . Myocardial infarction ([place])    8 total   . Nicotine abuse   . Other and unspecified  hyperlipidemia   . Paroxysmal VT ([place])   . Systolic CHF ([place])   . VF (ventricular fibrillation) ([place])    a. Hx appropriate ICD therapy for VF.    Patient Active Problem List   Diagnosis Date Noted  . Muscle spasm of back 03/04/2017  . Encounter for examination following treatment at hospital 03/04/2017  . COPD exacerbation ([place]) 04/05/2016  . Vitamin D deficiency 07/26/2015  . Depression with anxiety 03/11/2015  . Metabolic syndrome X 29/92/4268  . Adhesive capsulitis of left shoulder 09/26/2013  . Ventricular tachycardia (paroxysmal) ([place]) 12/17/2012  . Nicotine dependence 11/07/2012  . HSV-2 seropositive 11/05/2012  . H/O abnormal Pap smear 11/02/2012  . Fatty liver 08/10/2012  . HTN, goal below 130/80 09/13/2011  . Ischemic cardiomyopathy 05/27/2011  . Automatic implantable cardiac defibrillator--Medtronic 05/27/2011  . 6949 defibrillator lead 05/27/2011  . FATIGUE 06/05/2009  . VENTRICULAR FIBRILLATION 02/04/2009  . Morbid obesity ([place]) 12/20/2008  . Diabetes mellitus type 2 with complications ([place]) 34/19/6222  . Hyperlipidemia LDL goal <100 08/31/2007  . ANXIETY 08/31/2007  . SLE 08/31/2007  . ARTHRITIS, KNEES, BILATERAL 08/31/2007  . AVASCULAR NECROSIS 08/31/2007  . Insomnia secondary to depression with anxiety 08/31/2007  . MIGRAINES, HX OF 08/31/2007    Past Surgical History:  Procedure Laterality Date  . ABDOMINAL HYSTERECTOMY    . CHOLECYSTECTOMY    .  defibrillator placed   2009 and 05/2013  . ICD GENERATOR CHANGE  06/17/2013   Dr Caryl Comes  . ROTATOR CUFF REPAIR Right 2002    OB History    No data available       Home Medications    Prior to Admission medications   Medication Sig Start Date End Date Taking? Authorizing Provider  albuterol (PROVENTIL HFA;VENTOLIN HFA) 108 (90 Base) MCG/ACT inhaler Inhale 2 puffs into the lungs every 6 (six) hours as needed for wheezing or shortness of breath. 03/13/16   Fayrene Helper, MD  ALPRAZolam Duanne Moron) 1 MG  tablet TAKE 1 TABLET BY MOUTH AT BEDTIME AS NEEDED FOR ANXIETY. 03/02/17   Fayrene Helper, MD  aspirin 81 MG tablet Take 1 tablet (81 mg total) by mouth daily. 09/26/13   Deboraha Sprang, MD  Aspirin-Acetaminophen (GOODYS BODY PAIN PO) Take 1 Package by mouth daily as needed. For pain    [provider]  atorvastatin (LIPITOR) 10 MG tablet Take 1 tablet (10 mg total) by mouth daily. 03/18/16   Fayrene Helper, MD  budesonide-formoterol (SYMBICORT) 80-4.5 MCG/ACT inhaler Inhale 2 puffs into the lungs 2 (two) times daily. 04/02/16   Fayrene Helper, MD  carvedilol (COREG) 12.5 MG tablet Take 1 tablet (12.5 mg total) by mouth 2 (two) times daily. 05/15/17 08/13/17  Deboraha Sprang, MD  clopidogrel (PLAVIX) 75 MG tablet Take 1 tablet (75 mg total) by mouth daily. 06/05/17   Deboraha Sprang, MD  cyclobenzaprine (FLEXERIL) 5 MG tablet TAKE ONE TABLET BY MOUTH TWICE DAILY AS NEEDED FOR LEFT NECK SPASM AND ARM PAIN. 05/04/17   Fayrene Helper, MD  ergocalciferol (VITAMIN D2) 50000 units capsule Take 1 capsule (50,000 Units total) by mouth once a week. One capsule once weekly 01/17/16   Fayrene Helper, MD  fenofibrate (TRICOR) 145 MG tablet Take 145 mg by mouth daily.  04/15/13   [provider]  furosemide (LASIX) 20 MG tablet Take 2 tablets (40 mg total) by mouth as needed for fluid and edema 12/16/16   Deboraha Sprang, MD  mexiletine (MEXITIL) 200 MG capsule Take 1 capsule (200 mg total) by mouth 2 (two) times daily. 06/19/17   Baldwin [place], PA-C  oxyCODONE (ROXICODONE) 15 MG immediate release tablet Take 30 mg by mouth 3 (three) times daily. 04/10/16   [provider]  potassium chloride SA (K-DUR,KLOR-CON) 20 MEQ tablet Take 1 tablet (20 mEq total) by mouth on days when Furosemide is taken. Patient taking differently: Take 20 mEq by mouth daily. Take 1 tablet (20 mEq total) by mouth on days when Furosemide is taken. 12/16/16   Deboraha Sprang, MD    Family  History Family History  Problem Relation Age of Onset  . Hepatitis Mother 30       HCV  . Liver cancer Mother 60  . Cancer Mother   . Diabetes Mother   . Heart disease Mother   . Hyperlipidemia Mother   . Stroke Sister 45       x3  . Diabetes Sister   . Hyperlipidemia Sister   . Dementia Father   . HIV Sister   . Diabetes Brother   . Heart disease Brother   . Hyperlipidemia Brother     Social History Social History   Tobacco Use  . Smoking status: Current Every Day Smoker    Packs/day: 0.50    Years: 15.00    Pack years: 7.50  Types: Cigarettes  . Smokeless tobacco: Never Used  Substance Use Topics  . Alcohol use: Yes    Alcohol/week: 0.0 oz    Comment: occasionaly  . Drug use: No     Allergies   Bee venom; Cortisone; and Robaxin [methocarbamol]   Review of Systems Review of Systems  Constitutional: Negative for activity change, appetite change and fever.  HENT: Negative for congestion.   Respiratory: Positive for chest tightness and shortness of breath. Negative for cough.   Cardiovascular: Positive for chest pain.  Gastrointestinal: Negative for abdominal pain, nausea and vomiting.  Genitourinary: Negative for dysuria, hematuria, vaginal bleeding and vaginal discharge.  Musculoskeletal: Negative for arthralgias and myalgias.  Skin: Negative for rash.  Neurological: Negative for dizziness, weakness and headaches.    all other systems are negative except as noted in the HPI and PMH.    Physical Exam Updated Vital Signs BP 123/85 (BP Location: Right Arm)   Pulse 84   Temp 97.9 F (36.6 C) (Oral)   Resp 20   Ht 5\' 6"  (1.676 m)   Wt 93.4 kg (206 lb)   SpO2 99%   BMI 33.25 kg/m   Physical Exam  Constitutional: She is oriented to person, place, and time. She appears well-developed and well-nourished. She appears distressed.  tearful  HENT:  Head: Normocephalic and atraumatic.  Mouth/Throat: Oropharynx is clear and moist. No oropharyngeal  exudate.  Eyes: Conjunctivae and EOM are normal. Pupils are equal, round, and reactive to light.  Neck: Normal range of motion. Neck supple.  No meningismus.  Cardiovascular: Normal rate, regular rhythm, normal heart sounds and intact distal pulses.  No murmur heard. Equal radial pulses and grip strengths  Pulmonary/Chest: Effort normal and breath sounds normal. No respiratory distress. She exhibits tenderness.  Abdominal: Soft. There is no tenderness. There is no rebound and no guarding.  Musculoskeletal: Normal range of motion. She exhibits no edema or tenderness.  Neurological: She is alert and oriented to person, place, and time. No cranial nerve deficit. She exhibits normal muscle tone. Coordination normal.   5/5 strength throughout. CN 2-12 intact.Equal grip strength.   Skin: Skin is warm. Capillary refill takes less than 2 seconds.  Psychiatric: She has a normal mood and affect. Her behavior is normal.  Nursing note and vitals reviewed.    ED Treatments / Results  Labs (all labs ordered are listed, but only abnormal results are displayed) Labs Reviewed  BASIC METABOLIC PANEL - Abnormal; Notable for the following components:      Result Value   Potassium 3.4 (*)    Glucose, Bld 105 (*)    All other components within normal limits  CBC  I-STAT TROPONIN, ED    EKG  EKG Interpretation  Date/Time:  Monday June 29 2017 05:20:13 EST Ventricular Rate:  83 PR Interval:    QRS Duration: 128 QT Interval:  412 QTC Calculation: 485 R Axis:   95 Text Interpretation:  Sinus rhythm Probable LVH with secondary repol abnrm ST depr, consider ischemia, inferior leads Minimal ST elevation, anterolateral leads Baseline wander in lead(s) II III aVF similar to July 2018 Confirmed by Ezequiel Essex [phone]) on 06/29/2017 5:28:36 AM       Radiology Dg Chest 2 View  Result Date: 06/29/2017 CLINICAL DATA:  Mid to upper chest pain. History of lupus, cardiomyopathy, diabetes, CHF.  EXAM: CHEST  2 VIEW COMPARISON:  Chest radiograph February 27, 2017 FINDINGS: Cardiac silhouette is mildly enlarged. Mediastinal silhouette is nonsuspicious. Prominent bronchovascular structures without  pleural effusion or focal consolidation. LEFT cardiac defibrillator in situ. No pneumothorax. Soft tissue planes and included osseous structures are nonsuspicious. Surgical clips in the included right abdomen compatible with cholecystectomy. IMPRESSION: Mild cardiomegaly. Prominent bronchovascular structures seen with pulmonary edema, bronchitis or reactive airway disease. No focal consolidation. Electronically Signed   By: Elon Alas M.D.   On: 06/29/2017 06:17   Ct Angio Chest/abd/pel For Dissection W And/or Wo Contrast  Result Date: 06/29/2017 CLINICAL DATA:  Sharp central chest pain and pressure or. Pain is constant. Pain extends into the upper back between shoulders. Hemodynamically stable. EXAM: CT ANGIOGRAPHY CHEST, ABDOMEN AND PELVIS TECHNIQUE: Multidetector CT imaging through the chest, abdomen and pelvis was performed using the standard protocol during bolus administration of intravenous contrast. Multiplanar reconstructed images and MIPs were obtained and reviewed to evaluate the vascular anatomy. CONTRAST:  14mL ISOVUE-370 IOPAMIDOL (ISOVUE-370) INJECTION 76% COMPARISON:  Two-view chest x-ray from the same day. CT of the abdomen and pelvis 12/05/2016. FINDINGS: CTA CHEST FINDINGS Cardiovascular: The heart is enlarged. Two right ventricular leads are in place. Coronary artery calcifications are present. No significant pericardial effusion is present. Scattered aortic calcifications are present. There are no displaced calcifications. Eccentric soft tissue plaque is present along the lateral aspect of the descending aorta. Adjacent calcification is present. There is no false lumen or dissection. Additional eccentric plaque is present in the descending aorta. There is no aneurysm of the thoracic  aorta. Mediastinum/Nodes: No significant mediastinal or axillary adenopathy is present. The esophagus is dilated without mass. Pulmonary artery is are within normal limits. Lungs/Pleura: Mild ground-glass attenuation is present in the upper lobes bilaterally. No focal nodule, mass, or other significant airspace disease is present. There is no significant pleural effusion. Musculoskeletal: 12 rib-bearing thoracic type vertebral bodies are present. Vertebral body heights and alignment are normal. No focal lytic or blastic lesions are present. Ribs are within normal limits. Levels are intact bilaterally. The visualized scapula are unremarkable. Review of the MIP images confirms the above findings. CTA ABDOMEN AND PELVIS FINDINGS VASCULAR Aorta: The aorta is of normal size throughout the abdomen. Distal atherosclerotic changes are present without significant stenosis or aneurysm. Celiac: No significant stenosis.  Normal branch pattern. SMA: Patent without evidence of aneurysm, dissection, vasculitis or significant stenosis. Renals: Both renal arteries are patent without evidence of aneurysm, dissection, vasculitis, fibromuscular dysplasia or significant stenosis. IMA: Patent without evidence of aneurysm, dissection, vasculitis or significant stenosis. Inflow: Patent without evidence of aneurysm, dissection, vasculitis or significant stenosis. Veins: No obvious venous abnormality within the limitations of this arterial phase study. Review of the MIP images confirms the above findings. NON-VASCULAR Hepatobiliary: An 8 mm arterially enhancing lesion is present anteriorly within the left lobe of the liver. A similar lesion is present more laterally in the right lobe of the liver. Both lesions are seen on venous phase imaging during previous study. Both lesions than normalize on delayed images. These lesions are stable back to 2013. Common bile duct is within normal limits following cholecystectomy. Pancreas: Unremarkable.  No pancreatic ductal dilatation or surrounding inflammatory changes. Spleen: Normal in size without focal abnormality. Adrenals/Urinary Tract: The adrenal glands are normal bilaterally. Posterior right renal cyst is stable. Ureters and urinary bladder are within normal limits. Stomach/Bowel: The stomach and duodenum are within normal limits. Small bowel is unremarkable. Ileocolic junction is normal. The appendix is not discretely visualized and may be surgically absent. No inflammatory changes are present. The ascending and transverse colon are within normal limits. The  descending colon is within normal limits. Diverticular present of the sigmoid colon. No significant inflammatory changes are present. Lymphatic: No significant adenopathy is present. Reproductive: Status post hysterectomy. No adnexal masses. Other: No abdominal wall hernia or abnormality. No abdominopelvic ascites. Musculoskeletal: Lumbar vertebral body heights are normal. Alignment is anatomic. Degenerative changes are noted at the SI joints bilaterally. Pelvis is otherwise unremarkable. The hips are located and within normal limits. Review of the MIP images confirms the above findings. IMPRESSION: 1. Eccentric atherosclerotic changes in the proximal descending aorta without evidence for dissection, aneurysm, or significant stenosis. 2. Aortic Atherosclerosis (ICD10-I70.0). Atherosclerotic changes in the distal aorta and proximal iliac vessels without significant stenosis or aneurysm. 3. Cardiomegaly without failure. 4. Coronary artery disease. 5. Ground-glass attenuation of the upper lobes bilaterally likely reflects atelectasis or edema. No discrete airspace disease is present. 6. Benign-appearing lesions of the liver likely represent flash filling hemangiomas. These are stable and no follow-up is necessary. Electronically Signed   By: San Morelle M.D.   On: 06/29/2017 07:01    Procedures Procedures (including critical care  time)  Medications Ordered in ED Medications  aspirin chewable tablet 324 mg (324 mg Oral Given 06/29/17 0550)  nitroGLYCERIN (NITROSTAT) SL tablet 0.4 mg (0.4 mg Sublingual Given 06/29/17 0541)  morphine 4 MG/ML injection 4 mg (4 mg Intravenous Given 06/29/17 0543)     Initial Impression / Assessment and Plan / ED Course  I have reviewed the triage vital signs and the nursing notes.  Pertinent labs & imaging results that were available during my care of the patient were reviewed by me and considered in my medical decision making (see chart for details).    Patient with a history of nonischemic cardiomyopathy  as well as CAD with stents presenting with central chest pain that radiates to her back.  EKG shows LVH that is unchanged from previous.  Patient given aspirin nitroglycerin and morphine.  Presentation is concerning for angina.  EKG is unchanged from previous.  First troponin is negative.  Given patient severe pain that radiates to her back, CT a obtained to evaluate for dissection.  This is negative.  Patient's chest pain has resolved after nitroglycerin and morphine. Plan admission for ACS rule out.  Given her risk factors.  Discussed with Dr. Wynetta Emery.  CRITICAL CARE Performed by: Ezequiel Essex Total critical care time: 32 minutes Critical care time was exclusive of separately billable procedures and treating other patients. Critical care was necessary to treat or prevent imminent or life-threatening deterioration. Critical care was time spent personally by me on the following activities: development of treatment plan with patient and/or surrogate as well as nursing, discussions with consultants, evaluation of patient's response to treatment, examination of patient, obtaining history from patient or surrogate, ordering and performing treatments and interventions, ordering and review of laboratory studies, ordering and review of radiographic studies, pulse oximetry and  re-evaluation of patient's condition.  Final Clinical Impressions(s) / ED Diagnoses   Final diagnoses:  Chest pain, unspecified type    ED Discharge Orders    None       Timouthy Gilardi, Annie Main, MD 06/29/17 [phone]

## 2017-06-29 NOTE — H&P (Addendum)
History and Physical  [name] JSE:[id number] DOB: [birth year] DOA: 06/29/2017  Referring physician: Wyvonnia Dusky MD PCP: Fayrene Helper, MD  Cardiologist: Dr. Caryl Comes  Chief Complaint: chest pain   HPI: [name] is a 51 y.o. female with a complex cardiac history including recurrent VT with/ICD placement, followed closely by EP cardiology reports that she has been experiencing chest pain episodes since recently being started on mexiletine 200 mg twice daily in the outpatient setting.  She was also taking Coreg twice daily.  Patient reports that as soon as she started this medication she has been feeling ill.  She was started on this medication because she continued to have palpitations and carelink transmission noted VT episodes that required ATP therapies.    Her other past medical history as detailed below.  She presented to the ED with sudden onset central chest pain that radiates to the back that started while she was resting in bed at approximately 4 AM.  She described the pain as being constant and associated with shortness of breath and nausea.  There was nothing to alleviate the pain.  The pain was severe and it deviated into her back.  She denies diaphoresis.  She was given nitroglycerin and symptoms improved.  She tells me that her cardiologist wanted her to have a stress test when she was seen 2 weeks ago but she declined at that time. Denies having fever or chills.  She denies palpitations and denies firing of the AICD.  CTA chest negative for dissection.    Review of Systems: All systems reviewed and apart from history of presenting illness, are negative.  Past Medical History:  Diagnosis Date  . Arthritis of knee   . Automatic implantable cardiac defibrillator in situ    a. s/p prior [employer] ICD with 6949 lead. b. s/p Gen change & lead revision 05/2013.  . Cardiomyopathy secondary    Patient has cardiomyopathy out of proportion to her ischemic heart disease   . coronary artery disease    RCA stenting Myoview 2011 EF 30% infarction dilatation without ischemia  . Depression   . Diabetes mellitus   . GERD (gastroesophageal reflux disease)   . Insomnia   . Lupus (systemic lupus erythematosus) ([place])   . Migraines   . Myocardial infarction ([place])    8 total   . Nicotine abuse   . Other and unspecified hyperlipidemia   . Paroxysmal VT ([place])   . Systolic CHF ([place])   . VF (ventricular fibrillation) ([place])    a. Hx appropriate ICD therapy for VF.   Past Surgical History:  Procedure Laterality Date  . ABDOMINAL HYSTERECTOMY    . CHOLECYSTECTOMY    . defibrillator placed   2009 and 05/2013  . ICD GENERATOR CHANGE  06/17/2013   Dr Caryl Comes  . ROTATOR CUFF REPAIR Right 2002   Social History:  reports that she has been smoking cigarettes.  She has a 7.50 pack-year smoking history. she has never used smokeless tobacco. She reports that she drinks alcohol. She reports that she does not use drugs.  Allergies  Allergen Reactions  . Bee Venom Anaphylaxis  . Cortisone Swelling  . Robaxin [Methocarbamol] Hives and Itching    Family History  Problem Relation Age of Onset  . Hepatitis Mother 12       HCV  . Liver cancer Mother 69  . Cancer Mother   . Diabetes Mother   . Heart disease Mother   . Hyperlipidemia Mother   .  Stroke Sister 45       x3  . Diabetes Sister   . Hyperlipidemia Sister   . Dementia Father   . HIV Sister   . Diabetes Brother   . Heart disease Brother   . Hyperlipidemia Brother     Prior to Admission medications   Medication Sig Start Date End Date Taking? Authorizing Provider  albuterol (PROVENTIL HFA;VENTOLIN HFA) 108 (90 Base) MCG/ACT inhaler Inhale 2 puffs into the lungs every 6 (six) hours as needed for wheezing or shortness of breath. 03/13/16   Fayrene Helper, MD  ALPRAZolam Duanne Moron) 1 MG tablet TAKE 1 TABLET BY MOUTH AT BEDTIME AS NEEDED FOR ANXIETY. 03/02/17   Fayrene Helper, MD  aspirin 81 MG tablet Take  1 tablet (81 mg total) by mouth daily. 09/26/13   Deboraha Sprang, MD  Aspirin-Acetaminophen (GOODYS BODY PAIN PO) Take 1 Package by mouth daily as needed. For pain    [provider]  atorvastatin (LIPITOR) 10 MG tablet Take 1 tablet (10 mg total) by mouth daily. 03/18/16   Fayrene Helper, MD  budesonide-formoterol (SYMBICORT) 80-4.5 MCG/ACT inhaler Inhale 2 puffs into the lungs 2 (two) times daily. 04/02/16   Fayrene Helper, MD  carvedilol (COREG) 12.5 MG tablet Take 1 tablet (12.5 mg total) by mouth 2 (two) times daily. 05/15/17 08/13/17  Deboraha Sprang, MD  clopidogrel (PLAVIX) 75 MG tablet Take 1 tablet (75 mg total) by mouth daily. 06/05/17   Deboraha Sprang, MD  cyclobenzaprine (FLEXERIL) 5 MG tablet TAKE ONE TABLET BY MOUTH TWICE DAILY AS NEEDED FOR LEFT NECK SPASM AND ARM PAIN. 05/04/17   Fayrene Helper, MD  ergocalciferol (VITAMIN D2) 50000 units capsule Take 1 capsule (50,000 Units total) by mouth once a week. One capsule once weekly 01/17/16   Fayrene Helper, MD  fenofibrate (TRICOR) 145 MG tablet Take 145 mg by mouth daily.  04/15/13   [provider]  furosemide (LASIX) 20 MG tablet Take 2 tablets (40 mg total) by mouth as needed for fluid and edema 12/16/16   Deboraha Sprang, MD  mexiletine (MEXITIL) 200 MG capsule Take 1 capsule (200 mg total) by mouth 2 (two) times daily. 06/19/17   Baldwin Jamaica, PA-C  oxyCODONE (ROXICODONE) 15 MG immediate release tablet Take 30 mg by mouth 3 (three) times daily. 04/10/16   [provider]  potassium chloride SA (K-DUR,KLOR-CON) 20 MEQ tablet Take 1 tablet (20 mEq total) by mouth on days when Furosemide is taken. Patient taking differently: Take 20 mEq by mouth daily. Take 1 tablet (20 mEq total) by mouth on days when Furosemide is taken. 12/16/16   Deboraha Sprang, MD   Physical Exam: Vitals:   06/29/17 0530 06/29/17 0534 06/29/17 0535 06/29/17 0700  BP: 111/80 123/85 123/85 125/89  Pulse: 76 80  75  Resp:  14 18  16   Temp:      TempSrc:      SpO2: 98% 97%  96%  Weight:      Height:         General exam: Moderately built and nourished patient, lying comfortably supine on the gurney in no obvious distress.  Head, eyes and ENT: Nontraumatic and normocephalic. Pupils equally reacting to light and accommodation. Oral mucosa dry.  Neck: Supple. No JVD, carotid bruit or thyromegaly.  Lymphatics: No lymphadenopathy.  Respiratory system: Clear to auscultation. No increased work of breathing.  Cardiovascular system: S1 and S2 heard  Gastrointestinal system:  Abdomen is nondistended, soft and nontender. Normal bowel sounds heard. No organomegaly or masses appreciated.  Central nervous system: Alert and oriented. No focal neurological deficits.  Extremities: Symmetric 5 x 5 power. Peripheral pulses symmetrically felt.   Skin: No rashes or acute findings.  Musculoskeletal system: Negative exam.  Psychiatry: Pleasant and cooperative.  Labs on Admission:  Basic Metabolic Panel: Recent Labs  Lab 06/29/17 0508  NA 136  K 3.4*  CL 106  CO2 24  GLUCOSE 105*  BUN 9  CREATININE 0.72  CALCIUM 8.9   Liver Function Tests: No results for input(s): AST, ALT, ALKPHOS, BILITOT, PROT, ALBUMIN in the last 168 hours. No results for input(s): LIPASE, AMYLASE in the last 168 hours. No results for input(s): AMMONIA in the last 168 hours. CBC: Recent Labs  Lab 06/29/17 0508  WBC 8.1  HGB 12.6  HCT 38.0  MCV 83.2  PLT 305   Cardiac Enzymes: No results for input(s): CKTOTAL, CKMB, CKMBINDEX, TROPONINI in the last 168 hours.  BNP (last 3 results) No results for input(s): PROBNP in the last 8760 hours. CBG: No results for input(s): GLUCAP in the last 168 hours.  Radiological Exams on Admission: Dg Chest 2 View  Result Date: 06/29/2017 CLINICAL DATA:  Mid to upper chest pain. History of lupus, cardiomyopathy, diabetes, CHF. EXAM: CHEST  2 VIEW COMPARISON:  Chest radiograph February 27, 2017  FINDINGS: Cardiac silhouette is mildly enlarged. Mediastinal silhouette is nonsuspicious. Prominent bronchovascular structures without pleural effusion or focal consolidation. LEFT cardiac defibrillator in situ. No pneumothorax. Soft tissue planes and included osseous structures are nonsuspicious. Surgical clips in the included right abdomen compatible with cholecystectomy. IMPRESSION: Mild cardiomegaly. Prominent bronchovascular structures seen with pulmonary edema, bronchitis or reactive airway disease. No focal consolidation. Electronically Signed   By: Elon Alas M.D.   On: 06/29/2017 06:17   Ct Angio Chest/abd/pel For Dissection W And/or Wo Contrast  Result Date: 06/29/2017 CLINICAL DATA:  Sharp central chest pain and pressure or. Pain is constant. Pain extends into the upper back between shoulders. Hemodynamically stable. EXAM: CT ANGIOGRAPHY CHEST, ABDOMEN AND PELVIS TECHNIQUE: Multidetector CT imaging through the chest, abdomen and pelvis was performed using the standard protocol during bolus administration of intravenous contrast. Multiplanar reconstructed images and MIPs were obtained and reviewed to evaluate the vascular anatomy. CONTRAST:  110mL ISOVUE-370 IOPAMIDOL (ISOVUE-370) INJECTION 76% COMPARISON:  Two-view chest x-ray from the same day. CT of the abdomen and pelvis 12/05/2016. FINDINGS: CTA CHEST FINDINGS Cardiovascular: The heart is enlarged. Two right ventricular leads are in place. Coronary artery calcifications are present. No significant pericardial effusion is present. Scattered aortic calcifications are present. There are no displaced calcifications. Eccentric soft tissue plaque is present along the lateral aspect of the descending aorta. Adjacent calcification is present. There is no false lumen or dissection. Additional eccentric plaque is present in the descending aorta. There is no aneurysm of the thoracic aorta. Mediastinum/Nodes: No significant mediastinal or axillary  adenopathy is present. The esophagus is dilated without mass. Pulmonary artery is are within normal limits. Lungs/Pleura: Mild ground-glass attenuation is present in the upper lobes bilaterally. No focal nodule, mass, or other significant airspace disease is present. There is no significant pleural effusion. Musculoskeletal: 12 rib-bearing thoracic type vertebral bodies are present. Vertebral body heights and alignment are normal. No focal lytic or blastic lesions are present. Ribs are within normal limits. Levels are intact bilaterally. The visualized scapula are unremarkable. Review of the MIP images confirms the above findings. CTA ABDOMEN  AND PELVIS FINDINGS VASCULAR Aorta: The aorta is of normal size throughout the abdomen. Distal atherosclerotic changes are present without significant stenosis or aneurysm. Celiac: No significant stenosis.  Normal branch pattern. SMA: Patent without evidence of aneurysm, dissection, vasculitis or significant stenosis. Renals: Both renal arteries are patent without evidence of aneurysm, dissection, vasculitis, fibromuscular dysplasia or significant stenosis. IMA: Patent without evidence of aneurysm, dissection, vasculitis or significant stenosis. Inflow: Patent without evidence of aneurysm, dissection, vasculitis or significant stenosis. Veins: No obvious venous abnormality within the limitations of this arterial phase study. Review of the MIP images confirms the above findings. NON-VASCULAR Hepatobiliary: An 8 mm arterially enhancing lesion is present anteriorly within the left lobe of the liver. A similar lesion is present more laterally in the right lobe of the liver. Both lesions are seen on venous phase imaging during previous study. Both lesions than normalize on delayed images. These lesions are stable back to 2013. Common bile duct is within normal limits following cholecystectomy. Pancreas: Unremarkable. No pancreatic ductal dilatation or surrounding inflammatory  changes. Spleen: Normal in size without focal abnormality. Adrenals/Urinary Tract: The adrenal glands are normal bilaterally. Posterior right renal cyst is stable. Ureters and urinary bladder are within normal limits. Stomach/Bowel: The stomach and duodenum are within normal limits. Small bowel is unremarkable. Ileocolic junction is normal. The appendix is not discretely visualized and may be surgically absent. No inflammatory changes are present. The ascending and transverse colon are within normal limits. The descending colon is within normal limits. Diverticular present of the sigmoid colon. No significant inflammatory changes are present. Lymphatic: No significant adenopathy is present. Reproductive: Status post hysterectomy. No adnexal masses. Other: No abdominal wall hernia or abnormality. No abdominopelvic ascites. Musculoskeletal: Lumbar vertebral body heights are normal. Alignment is anatomic. Degenerative changes are noted at the SI joints bilaterally. Pelvis is otherwise unremarkable. The hips are located and within normal limits. Review of the MIP images confirms the above findings. IMPRESSION: 1. Eccentric atherosclerotic changes in the proximal descending aorta without evidence for dissection, aneurysm, or significant stenosis. 2. Aortic Atherosclerosis (ICD10-I70.0). Atherosclerotic changes in the distal aorta and proximal iliac vessels without significant stenosis or aneurysm. 3. Cardiomegaly without failure. 4. Coronary artery disease. 5. Ground-glass attenuation of the upper lobes bilaterally likely reflects atelectasis or edema. No discrete airspace disease is present. 6. Benign-appearing lesions of the liver likely represent flash filling hemangiomas. These are stable and no follow-up is necessary. Electronically Signed   By: San Morelle M.D.   On: 06/29/2017 07:01    EKG: Independently reviewed.  Normal sinus rhythm.  Assessment/Plan Principal Problem:   Chest pain with high risk  for cardiac etiology Active Problems:   Diabetes mellitus type 2 with complications (HCC)   Hyperlipidemia LDL goal <100   Ventricular fibrillation (HCC)   Insomnia secondary to depression with anxiety   Ischemic cardiomyopathy   Implantable cardioverter-defibrillator (ICD) in situ   HTN, goal below 130/80   Fatty liver   Nicotine dependence   Ventricular tachycardia (paroxysmal) (HCC)   Metabolic syndrome X   GERD (gastroesophageal reflux disease)  1. Chest pain with coronary artery disease-patient unsure of when she had her last stress test but it was years ago.  Admit for observation.  Monitor troponin.  Monitor telemetry.  Concerned that her symptoms may be related to the mexiletine that she was recently started on.  We will consult cardiology for assistance with medication management.  Patient is n.p.o. for possible stress testing. 2. Ischemic cardiomyopathy-patient has AICD  in place. 3. Paroxysmal ventricular tachycardia- cardiology consult for medication management. 4. Diabetes mellitus type 2- we will monitor blood glucose with sliding scale coverage.  Check hemoglobin A1c. 5. Essential hypertension-controlled.  Follow. 6. Dyslipidemia- resume lipid-lowering medications.  Check lipid panel. 7. Nicotine dependence-counseled to discontinue all tobacco use.   DVT Prophylaxis: lovenox Code Status: full   Family Communication: none present  Disposition Plan: Home  Time spent: 76 mins  Irwin Brakeman, MD [facility] Pager [phone]  If 7PM-7AM, please contact night-coverage [email or web address] Password [facility] 06/29/2017, 7:44 AM

## 2017-06-29 NOTE — ED Triage Notes (Signed)
Pt c/o central chest pain that started x 1 hour ago while lying in bed; pt states the pain is constant and has back pain

## 2017-06-29 NOTE — Progress Notes (Signed)
Called Dr. Bronson Ing to let him know the patient had some chest pain this morning after taking the Mexitil. I told him that the patient did want to take it anymore so he said it was okay to discontinue the drug for now. Patient's chest pain did resolve after giving her 2mg  of Morphine.

## 2017-06-30 ENCOUNTER — Observation Stay (HOSPITAL_BASED_OUTPATIENT_CLINIC_OR_DEPARTMENT_OTHER): Payer: Medicare Other

## 2017-06-30 ENCOUNTER — Encounter (HOSPITAL_COMMUNITY): Payer: Self-pay

## 2017-06-30 ENCOUNTER — Telehealth: Payer: Self-pay | Admitting: Cardiology

## 2017-06-30 ENCOUNTER — Ambulatory Visit (INDEPENDENT_AMBULATORY_CARE_PROVIDER_SITE_OTHER): Payer: Self-pay | Admitting: *Deleted

## 2017-06-30 DIAGNOSIS — E118 Type 2 diabetes mellitus with unspecified complications: Secondary | ICD-10-CM | POA: Diagnosis not present

## 2017-06-30 DIAGNOSIS — K760 Fatty (change of) liver, not elsewhere classified: Secondary | ICD-10-CM

## 2017-06-30 DIAGNOSIS — R079 Chest pain, unspecified: Secondary | ICD-10-CM | POA: Diagnosis not present

## 2017-06-30 DIAGNOSIS — I10 Essential (primary) hypertension: Secondary | ICD-10-CM

## 2017-06-30 DIAGNOSIS — I5022 Chronic systolic (congestive) heart failure: Secondary | ICD-10-CM

## 2017-06-30 DIAGNOSIS — K219 Gastro-esophageal reflux disease without esophagitis: Secondary | ICD-10-CM | POA: Diagnosis not present

## 2017-06-30 DIAGNOSIS — F5105 Insomnia due to other mental disorder: Secondary | ICD-10-CM

## 2017-06-30 DIAGNOSIS — E785 Hyperlipidemia, unspecified: Secondary | ICD-10-CM | POA: Diagnosis not present

## 2017-06-30 DIAGNOSIS — I255 Ischemic cardiomyopathy: Secondary | ICD-10-CM

## 2017-06-30 DIAGNOSIS — F418 Other specified anxiety disorders: Secondary | ICD-10-CM

## 2017-06-30 DIAGNOSIS — R9439 Abnormal result of other cardiovascular function study: Secondary | ICD-10-CM

## 2017-06-30 LAB — BASIC METABOLIC PANEL
Anion gap: 5 (ref 5–15)
BUN: 8 mg/dL (ref 6–20)
CO2: 27 mmol/L (ref 22–32)
Calcium: 8.7 mg/dL — ABNORMAL LOW (ref 8.9–10.3)
Chloride: 107 mmol/L (ref 101–111)
Creatinine, Ser: 0.77 mg/dL (ref 0.44–1.00)
GFR calc non Af Amer: >60 mL/min (ref >60)
Glucose, Bld: 113 mg/dL — ABNORMAL HIGH (ref 65–99)
Potassium: 4.1 mmol/L (ref 3.5–5.1)
SODIUM: 139 mmol/L (ref 135–145)

## 2017-06-30 LAB — NM MYOCAR MULTI W/SPECT W/WALL MOTION / EF
CHL CUP NUCLEAR SDS: 0
CHL CUP RESTING HR STRESS: 75 {beats}/min
LHR: 0.44
LV dias vol: 355 mL (ref 46–106)
LVSYSVOL: 310 mL
Peak HR: 87 {beats}/min
SRS: 11
SSS: 11
TID: 0.99

## 2017-06-30 LAB — MAGNESIUM: Magnesium: 2.1 mg/dL (ref 1.7–2.4)

## 2017-06-30 LAB — HIV ANTIBODY (ROUTINE TESTING W REFLEX): HIV Screen 4th Generation wRfx: NONREACTIVE

## 2017-06-30 MED ORDER — AMIODARONE HCL 200 MG PO TABS
200.0000 mg | ORAL_TABLET | Freq: Every day | ORAL | Status: DC
  Administered 2017-06-30: 200 mg via ORAL
  Filled 2017-06-30: qty 1

## 2017-06-30 MED ORDER — REGADENOSON 0.4 MG/5ML IV SOLN
INTRAVENOUS | Status: AC
  Administered 2017-06-30: 0.4 mg via INTRAVENOUS
  Filled 2017-06-30: qty 5

## 2017-06-30 MED ORDER — SODIUM CHLORIDE 0.9% FLUSH
INTRAVENOUS | Status: AC
  Administered 2017-06-30: 10 mL via INTRAVENOUS
  Filled 2017-06-30: qty 10

## 2017-06-30 MED ORDER — TECHNETIUM TC 99M TETROFOSMIN IV KIT
10.0000 | PACK | Freq: Once | INTRAVENOUS | Status: AC | PRN
  Administered 2017-06-30: 8.2 via INTRAVENOUS

## 2017-06-30 MED ORDER — TECHNETIUM TC 99M TETROFOSMIN IV KIT
30.0000 | PACK | Freq: Once | INTRAVENOUS | Status: AC | PRN
  Administered 2017-06-30: 27.6 via INTRAVENOUS

## 2017-06-30 MED ORDER — AMIODARONE HCL 200 MG PO TABS: 200.0000 mg | ORAL_TABLET | Freq: Every day | ORAL | 0 refills | Status: DC

## 2017-06-30 NOTE — Care Management Obs Status (Signed)
MEDICARE OBSERVATION STATUS NOTIFICATION   Patient Details  Name: [name] MRN: [id number] Date of Birth: [birth year]   Medicare Observation Status Notification Given:  Yes    Sherald Barge, RN 06/30/2017, 10:08 AM

## 2017-06-30 NOTE — Discharge Summary (Addendum)
Physician Discharge Summary  [name]:[id number] DOB: [birth year] DOA: 06/29/2017  PCP: Fayrene Helper, MD Cardiologist: Dr. Caryl Comes  Admit date: 06/29/2017 Discharge date: 06/30/2017  Admitted From: Home  Disposition: Home   Recommendations for Outpatient Follow-up:  1. Follow up with PCP in 1 weeks 2. Follow up with Dr. Caryl Comes in 1 week 3. Please obtain BMP/CBC in one week 4. Please consider starting entresto on outpatient follow up   Discharge Condition: STABLE   CODE STATUS: FULL    Brief Hospitalization Summary: Please see all hospital notes, images, labs for full details of the hospitalization.   HPI: [name] is a 51 y.o. female with a complex cardiac history including recurrent VT with/ICD placement, followed closely by EP cardiology reports that she has been experiencing chest pain episodes since recently being started on mexiletine 200 mg twice daily in the outpatient setting.  She was also taking Coreg twice daily.  Patient reports that as soon as she started this medication she has been feeling ill.  She was started on this medication because she continued to have palpitations and carelink transmission noted VT episodes that required ATP therapies.    Her other past medical history as detailed below.  She presented to the ED with sudden onset central chest pain that radiates to the back that started while she was resting in bed at approximately 4 AM. She described the pain as being constant and associated with shortness of breath and nausea. There was nothing to alleviate the pain. The pain was severe and it deviated into her back. She denies diaphoresis. She was given nitroglycerin and symptoms improved. She tells me that her cardiologist wanted her to have a stress test when she was seen 2 weeks ago but she declined at that time. Denies having fever or chills. She denies palpitations and denies firing of the AICD.  CTA chest negative for  dissection.   The patient was seen by cardiology and arranged to have a nuclear stress test which did not show any ischemic findings.  The patient was started on amiodarone to control her ventricular tachycardia.  She should follow-up with her primary cardiologist Dr. Caryl Comes to discuss starting [facility].  The patient was taken off the mexiletine and she had no further chest pain.  This was discontinued.  In addition, cardiology discontinued Plavix as they reported that there was no need for double antiplatelet coverage.  The patient will continue aspirin.  The patient will also continue Coreg.  Further management outpatient with her primary care physician and cardiologist.  Her cardiac troponins were negative.  The patient was counseled and given written educational materials regarding smoking cessation and strongly encouraged to stop tobacco use.  Discharge Diagnoses:  Principal Problem:   Chest pain with high risk for cardiac etiology Active Problems:   Diabetes mellitus type 2 with complications (HCC)   Hyperlipidemia LDL goal <100   Ventricular fibrillation (HCC)   Insomnia secondary to depression with anxiety   Ischemic cardiomyopathy   Implantable cardioverter-defibrillator (ICD) in situ   HTN, goal below 130/80   Fatty liver   Nicotine dependence   Ventricular tachycardia (paroxysmal) (HCC)   Metabolic syndrome X   GERD (gastroesophageal reflux disease)    Discharge Instructions: Discharge Instructions    (HEART FAILURE PATIENTS) Call MD:  Anytime you have any of the following symptoms: 1) 3 pound weight gain in 24 hours or 5 pounds in 1 week 2) shortness of breath, with or without a dry  hacking cough 3) swelling in the hands, feet or stomach 4) if you have to sleep on extra pillows at night in order to breathe.   Complete by:  As directed    Call MD for:  difficulty breathing, headache or visual disturbances   Complete by:  As directed    Call MD for:  extreme fatigue   Complete  by:  As directed    Call MD for:  persistant dizziness or light-headedness   Complete by:  As directed    Call MD for:  persistant nausea and vomiting   Complete by:  As directed    Call MD for:  severe uncontrolled pain   Complete by:  As directed    Diet - low sodium heart healthy   Complete by:  As directed    Increase activity slowly   Complete by:  As directed      Allergies as of 06/30/2017      Reactions   Bee Venom Anaphylaxis   Cortisone Swelling   Robaxin [methocarbamol] Hives, Itching      Medication List    STOP taking these medications   clopidogrel 75 MG tablet Commonly known as:  PLAVIX   GOODYS BODY PAIN PO   mexiletine 200 MG capsule Commonly known as:  MEXITIL     TAKE these medications   albuterol 108 (90 Base) MCG/ACT inhaler Commonly known as:  PROVENTIL HFA;VENTOLIN HFA Inhale 2 puffs into the lungs every 6 (six) hours as needed for wheezing or shortness of breath.   ALPRAZolam 1 MG tablet Commonly known as:  XANAX TAKE 1 TABLET BY MOUTH AT BEDTIME AS NEEDED FOR ANXIETY.   amiodarone 200 MG tablet Commonly known as:  PACERONE Take 1 tablet (200 mg total) daily by mouth.   aspirin 81 MG tablet Take 1 tablet (81 mg total) by mouth daily.   atorvastatin 10 MG tablet Commonly known as:  LIPITOR Take 1 tablet (10 mg total) by mouth daily.   budesonide-formoterol 80-4.5 MCG/ACT inhaler Commonly known as:  SYMBICORT Inhale 2 puffs into the lungs 2 (two) times daily.   carvedilol 12.5 MG tablet Commonly known as:  COREG Take 1 tablet (12.5 mg total) by mouth 2 (two) times daily.   cyclobenzaprine 5 MG tablet Commonly known as:  FLEXERIL TAKE ONE TABLET BY MOUTH TWICE DAILY AS NEEDED FOR LEFT NECK SPASM AND ARM PAIN.   fenofibrate 145 MG tablet Commonly known as:  TRICOR Take 145 mg by mouth daily.   furosemide 20 MG tablet Commonly known as:  LASIX Take 2 tablets (40 mg total) by mouth as needed for fluid and edema   oxyCODONE 15  MG immediate release tablet Commonly known as:  ROXICODONE Take 30 mg 2 (two) times daily by mouth.   potassium chloride SA 20 MEQ tablet Commonly known as:  K-DUR,KLOR-CON Take 1 tablet (20 mEq total) by mouth on days when Furosemide is taken. What changed:    how much to take  how to take this  when to take this  additional instructions      Follow-up Information    Deboraha Sprang, MD. Schedule an appointment as soon as possible for a visit in 1 week(s).   Specialty:  Cardiology Why:  Hospital Follow Up Contact information: [address] [address] [phone]        Fayrene Helper, MD. Schedule an appointment as soon as possible for a visit in 1 week(s).  Specialty:  Family Medicine Contact information: [address], [address] [place] [address] [phone]          Allergies  Allergen Reactions  . Bee Venom Anaphylaxis  . Cortisone Swelling  . Robaxin [Methocarbamol] Hives and Itching   Current Discharge Medication List    START taking these medications   Details  amiodarone (PACERONE) 200 MG tablet Take 1 tablet (200 mg total) daily by mouth. Qty: 30 tablet, Refills: 0      CONTINUE these medications which have NOT CHANGED   Details  albuterol (PROVENTIL HFA;VENTOLIN HFA) 108 (90 Base) MCG/ACT inhaler Inhale 2 puffs into the lungs every 6 (six) hours as needed for wheezing or shortness of breath. Qty: 1 Inhaler, Refills: 0    ALPRAZolam (XANAX) 1 MG tablet TAKE 1 TABLET BY MOUTH AT BEDTIME AS NEEDED FOR ANXIETY. Qty: 30 tablet, Refills: 3   Associated Diagnoses: Anxiety    aspirin 81 MG tablet Take 1 tablet (81 mg total) by mouth daily. Qty: 30 tablet, Refills: 6    atorvastatin (LIPITOR) 10 MG tablet Take 1 tablet (10 mg total) by mouth daily. Qty: 90 tablet, Refills: 1    budesonide-formoterol (SYMBICORT) 80-4.5 MCG/ACT inhaler Inhale 2 puffs into the lungs 2 (two) times daily. Qty: 1 Inhaler,  Refills: 3   Associated Diagnoses: Chronic cough    carvedilol (COREG) 12.5 MG tablet Take 1 tablet (12.5 mg total) by mouth 2 (two) times daily. Qty: 60 tablet, Refills: 6    cyclobenzaprine (FLEXERIL) 5 MG tablet TAKE ONE TABLET BY MOUTH TWICE DAILY AS NEEDED FOR LEFT NECK SPASM AND ARM PAIN. Qty: 20 tablet, Refills: 0    fenofibrate (TRICOR) 145 MG tablet Take 145 mg by mouth daily.     furosemide (LASIX) 20 MG tablet Take 2 tablets (40 mg total) by mouth as needed for fluid and edema Qty: 30 tablet, Refills: 6   Associated Diagnoses: Chronic systolic CHF (congestive heart failure) (HCC)    oxyCODONE (ROXICODONE) 15 MG immediate release tablet Take 30 mg 2 (two) times daily by mouth.     potassium chloride SA (K-DUR,KLOR-CON) 20 MEQ tablet Take 1 tablet (20 mEq total) by mouth on days when Furosemide is taken. Qty: 30 tablet, Refills: 6      STOP taking these medications     Aspirin-Acetaminophen (GOODYS BODY PAIN PO)      clopidogrel (PLAVIX) 75 MG tablet      mexiletine (MEXITIL) 200 MG capsule         Procedures/Studies: Dg Chest 2 View  Result Date: 06/29/2017 CLINICAL DATA:  Mid to upper chest pain. History of lupus, cardiomyopathy, diabetes, CHF. EXAM: CHEST  2 VIEW COMPARISON:  Chest radiograph February 27, 2017 FINDINGS: Cardiac silhouette is mildly enlarged. Mediastinal silhouette is nonsuspicious. Prominent bronchovascular structures without pleural effusion or focal consolidation. LEFT cardiac defibrillator in situ. No pneumothorax. Soft tissue planes and included osseous structures are nonsuspicious. Surgical clips in the included right abdomen compatible with cholecystectomy. IMPRESSION: Mild cardiomegaly. Prominent bronchovascular structures seen with pulmonary edema, bronchitis or reactive airway disease. No focal consolidation. Electronically Signed   By: Elon Alas M.D.   On: 06/29/2017 06:17   Nm Myocar Multi W/spect W/wall Motion / Ef  Result Date:  06/30/2017  Defect 1: There is a large defect of severe severity present in the basal anteroseptal, basal inferoseptal, basal inferior, mid anteroseptal, mid inferoseptal, mid inferior and apical inferior location.  Findings consistent with probable prior myocardial infarction with soft  tissue attenuation also contributing to defects. No ischemic territories.  This is a high risk study based on severely reduced LV function.  Nuclear stress EF: 13%.  LVH with repolarization abnormalities and nonspecific intraventricular conduction delay seen throughout study.    Ct Angio Chest/abd/pel For Dissection W And/or Wo Contrast  Result Date: 06/29/2017 CLINICAL DATA:  Sharp central chest pain and pressure or. Pain is constant. Pain extends into the upper back between shoulders. Hemodynamically stable. EXAM: CT ANGIOGRAPHY CHEST, ABDOMEN AND PELVIS TECHNIQUE: Multidetector CT imaging through the chest, abdomen and pelvis was performed using the standard protocol during bolus administration of intravenous contrast. Multiplanar reconstructed images and MIPs were obtained and reviewed to evaluate the vascular anatomy. CONTRAST:  164mL ISOVUE-370 IOPAMIDOL (ISOVUE-370) INJECTION 76% COMPARISON:  Two-view chest x-ray from the same day. CT of the abdomen and pelvis 12/05/2016. FINDINGS: CTA CHEST FINDINGS Cardiovascular: The heart is enlarged. Two right ventricular leads are in place. Coronary artery calcifications are present. No significant pericardial effusion is present. Scattered aortic calcifications are present. There are no displaced calcifications. Eccentric soft tissue plaque is present along the lateral aspect of the descending aorta. Adjacent calcification is present. There is no false lumen or dissection. Additional eccentric plaque is present in the descending aorta. There is no aneurysm of the thoracic aorta. Mediastinum/Nodes: No significant mediastinal or axillary adenopathy is present. The esophagus is  dilated without mass. Pulmonary artery is are within normal limits. Lungs/Pleura: Mild ground-glass attenuation is present in the upper lobes bilaterally. No focal nodule, mass, or other significant airspace disease is present. There is no significant pleural effusion. Musculoskeletal: 12 rib-bearing thoracic type vertebral bodies are present. Vertebral body heights and alignment are normal. No focal lytic or blastic lesions are present. Ribs are within normal limits. Levels are intact bilaterally. The visualized scapula are unremarkable. Review of the MIP images confirms the above findings. CTA ABDOMEN AND PELVIS FINDINGS VASCULAR Aorta: The aorta is of normal size throughout the abdomen. Distal atherosclerotic changes are present without significant stenosis or aneurysm. Celiac: No significant stenosis.  Normal branch pattern. SMA: Patent without evidence of aneurysm, dissection, vasculitis or significant stenosis. Renals: Both renal arteries are patent without evidence of aneurysm, dissection, vasculitis, fibromuscular dysplasia or significant stenosis. IMA: Patent without evidence of aneurysm, dissection, vasculitis or significant stenosis. Inflow: Patent without evidence of aneurysm, dissection, vasculitis or significant stenosis. Veins: No obvious venous abnormality within the limitations of this arterial phase study. Review of the MIP images confirms the above findings. NON-VASCULAR Hepatobiliary: An 8 mm arterially enhancing lesion is present anteriorly within the left lobe of the liver. A similar lesion is present more laterally in the right lobe of the liver. Both lesions are seen on venous phase imaging during previous study. Both lesions than normalize on delayed images. These lesions are stable back to 2013. Common bile duct is within normal limits following cholecystectomy. Pancreas: Unremarkable. No pancreatic ductal dilatation or surrounding inflammatory changes. Spleen: Normal in size without focal  abnormality. Adrenals/Urinary Tract: The adrenal glands are normal bilaterally. Posterior right renal cyst is stable. Ureters and urinary bladder are within normal limits. Stomach/Bowel: The stomach and duodenum are within normal limits. Small bowel is unremarkable. Ileocolic junction is normal. The appendix is not discretely visualized and may be surgically absent. No inflammatory changes are present. The ascending and transverse colon are within normal limits. The descending colon is within normal limits. Diverticular present of the sigmoid colon. No significant inflammatory changes are present. Lymphatic: No significant adenopathy  is present. Reproductive: Status post hysterectomy. No adnexal masses. Other: No abdominal wall hernia or abnormality. No abdominopelvic ascites. Musculoskeletal: Lumbar vertebral body heights are normal. Alignment is anatomic. Degenerative changes are noted at the SI joints bilaterally. Pelvis is otherwise unremarkable. The hips are located and within normal limits. Review of the MIP images confirms the above findings. IMPRESSION: 1. Eccentric atherosclerotic changes in the proximal descending aorta without evidence for dissection, aneurysm, or significant stenosis. 2. Aortic Atherosclerosis (ICD10-I70.0). Atherosclerotic changes in the distal aorta and proximal iliac vessels without significant stenosis or aneurysm. 3. Cardiomegaly without failure. 4. Coronary artery disease. 5. Ground-glass attenuation of the upper lobes bilaterally likely reflects atelectasis or edema. No discrete airspace disease is present. 6. Benign-appearing lesions of the liver likely represent flash filling hemangiomas. These are stable and no follow-up is necessary. Electronically Signed   By: San Morelle M.D.   On: 06/29/2017 07:01      Subjective: The patient is no longer having symptoms.  Discharge Exam: Vitals:   06/30/17 0933 06/30/17 1202  BP: 119/76   Pulse: 79   Resp: 16   Temp:  98.1 F (36.7 C)   SpO2: 100% 90%   Vitals:   06/30/17 0045 06/30/17 0445 06/30/17 0933 06/30/17 1202  BP: 125/79 121/84 119/76   Pulse: 74 77 79   Resp: 16 16 16    Temp: 98.3 F (36.8 C) 98.4 F (36.9 C) 98.1 F (36.7 C)   TempSrc: Oral Oral Oral   SpO2: 97% 98% 100% 90%  Weight:      Height:       General: Pt is alert, awake, not in acute distress Cardiovascular: Normal S1/S2 +, no rubs, no gallops Respiratory: CTA bilaterally, no wheezing, no rhonchi Abdominal: Soft, NT, ND, bowel sounds + Extremities: no edema, no cyanosis   The results of significant diagnostics from this hospitalization (including imaging, microbiology, ancillary and laboratory) are listed below for reference.     Microbiology: No results found for this or any previous visit (from the past 240 hour(s)).   Labs: BNP (last 3 results) No results for input(s): BNP in the last 8760 hours. Basic Metabolic Panel: Recent Labs  Lab 06/29/17 0508 06/30/17 0442  NA 136 139  K 3.4* 4.1  CL 106 107  CO2 24 27  GLUCOSE 105* 113*  BUN 9 8  CREATININE 0.72 0.77  CALCIUM 8.9 8.7*  MG  --  2.1   Liver Function Tests: No results for input(s): AST, ALT, ALKPHOS, BILITOT, PROT, ALBUMIN in the last 168 hours. No results for input(s): LIPASE, AMYLASE in the last 168 hours. No results for input(s): AMMONIA in the last 168 hours. CBC: Recent Labs  Lab 06/29/17 0508  WBC 8.1  HGB 12.6  HCT 38.0  MCV 83.2  PLT 305   Cardiac Enzymes: Recent Labs  Lab 06/29/17 0844 06/29/17 1055 06/29/17 1405  TROPONINI <0.03 <0.03 <0.03   BNP: Invalid input(s): POCBNP CBG: Recent Labs  Lab 06/29/17 1115  GLUCAP 143*   D-Dimer No results for input(s): DDIMER in the last 72 hours. Hgb A1c Recent Labs    06/29/17 0844  HGBA1C 6.1*   Lipid Profile Recent Labs    06/29/17 0844  CHOL 106  HDL 28*  LDLCALC 63  TRIG 75  CHOLHDL 3.8   Thyroid function studies No results for input(s): TSH, T4TOTAL,  T3FREE, THYROIDAB in the last 72 hours.  Invalid input(s): FREET3 Anemia work up No results for input(s): VITAMINB12, FOLATE, FERRITIN, TIBC,  IRON, RETICCTPCT in the last 72 hours. Urinalysis    Component Value Date/Time   COLORURINE YELLOW 12/05/2016 0316   APPEARANCEUR CLEAR 12/05/2016 0316   LABSPEC 1.005 12/05/2016 0316   PHURINE 6.0 12/05/2016 0316   GLUCOSEU NEGATIVE 12/05/2016 0316   HGBUR SMALL (A) 12/05/2016 0316   HGBUR large 08/07/2010 0901   BILIRUBINUR NEGATIVE 12/05/2016 0316   BILIRUBINUR neg 07/31/2016 0854   KETONESUR NEGATIVE 12/05/2016 0316   PROTEINUR NEGATIVE 12/05/2016 0316   UROBILINOGEN 1.0 07/31/2016 0854   UROBILINOGEN 0.2 09/13/2012 2220   NITRITE NEGATIVE 12/05/2016 0316   LEUKOCYTESUR TRACE (A) 12/05/2016 0316   Sepsis Labs Invalid input(s): PROCALCITONIN,  WBC,  LACTICIDVEN Microbiology No results found for this or any previous visit (from the past 240 hour(s)).  Time coordinating discharge:   SIGNED:  Irwin Brakeman, MD  [facility] 06/30/2017, 12:07 PM Pager [phone]  If 7PM-7AM, please contact night-coverage [email or web address] Password [email or web address]

## 2017-06-30 NOTE — Discharge Instructions (Signed)
Follow with Primary MD  Fayrene Helper, MD  and other consultant's as instructed your Hospitalist MD  Please get a complete blood count and chemistry panel checked by your Primary MD at your next visit, and again as instructed by your Primary MD.  Get Medicines reviewed and adjusted: Please take all your medications with you for your next visit with your Primary MD  Laboratory/radiological data: Please request your Primary MD to go over all hospital tests and procedure/radiological results at the follow up, please ask your Primary MD to get all Hospital records sent to his/her office.  In some cases, they will be blood work, cultures and biopsy results pending at the time of your discharge. Please request that your primary care M.D. follows up on these results.  Also Note the following: If you experience worsening of your admission symptoms, develop shortness of breath, life threatening emergency, suicidal or homicidal thoughts you must seek medical attention immediately by calling 911 or calling your MD immediately  if symptoms less severe.  You must read complete instructions/literature along with all the possible adverse reactions/side effects for all the Medicines you take and that have been prescribed to you. Take any new Medicines after you have completely understood and accpet all the possible adverse reactions/side effects.   Do not drive when taking Pain medications or sleeping medications (Benzodaizepines)  Do not take more than prescribed Pain, Sleep and Anxiety Medications. It is not advisable to combine anxiety,sleep and pain medications without talking with your primary care practitioner  Special Instructions: If you have smoked or chewed Tobacco  in the last 2 yrs please stop smoking, stop any regular Alcohol  and or any Recreational drug use.  Wear Seat belts while driving.  Please note: You were cared for by a hospitalist during your hospital stay. Once you are  discharged, your primary care physician will handle any further medical issues. Please note that NO REFILLS for any discharge medications will be authorized once you are discharged, as it is imperative that you return to your primary care physician (or establish a relationship with a primary care physician if you do not have one) for your post hospital discharge needs so that they can reassess your need for medications and monitor your lab values.

## 2017-06-30 NOTE — Progress Notes (Signed)
Progress Note  Patient Name: [name] Date of Encounter: 06/30/2017  Primary Cardiologist: New-Dr. Kate Sable  Subjective   No complaints of recurrent chest pain, overall fatigued.  She was evaluated and assessed prior to stress Myoview in the stress lab.  Inpatient Medications    Scheduled Meds: . aspirin EC  81 mg Oral Daily  . atorvastatin  10 mg Oral q1800  . carvedilol  12.5 mg Oral BID  . clopidogrel  75 mg Oral Daily  . enoxaparin (LOVENOX) injection  40 mg Subcutaneous Q24H  . Influenza vac split quadrivalent PF  0.5 mL Intramuscular Tomorrow-1000  . mometasone-formoterol  2 puff Inhalation BID  . pantoprazole  40 mg Oral BID   Continuous Infusions:  PRN Meds: acetaminophen, ALPRAZolam, guaiFENesin-dextromethorphan, ipratropium-albuterol, morphine injection, nitroGLYCERIN, ondansetron (ZOFRAN) IV, technetium tetrofosmin   Vital Signs    Vitals:   06/29/17 2007 06/29/17 2045 06/30/17 0045 06/30/17 0445  BP:  117/72 125/79 121/84  Pulse: 81 79 74 77  Resp: 18 16 16 16   Temp:  97.7 F (36.5 C) 98.3 F (36.8 C) 98.4 F (36.9 C)  TempSrc:  Oral Oral Oral  SpO2: 92% 93% 97% 98%  Weight:      Height:        Intake/Output Summary (Last 24 hours) at 06/30/2017 0849 Last data filed at 06/30/2017 0458 Gross per 24 hour  Intake 1200 ml  Output 3100 ml  Net -1900 ml   Filed Weights   06/29/17 0503 06/29/17 0826  Weight: 206 lb (93.4 kg) 209 lb 12.8 oz (95.2 kg)    Telemetry    Sinus rhythm with LVH and frequent PVCs.- Personally Reviewed  Physical Exam   GEN: No acute distress.   Neck: No JVD Cardiac: RRR, with irregular beats, no murmurs, rubs, or gallops.  Respiratory: Clear to auscultation bilaterally. GI: Soft, nontender, non-distended  MS: No edema; No deformity. Neuro:  Nonfocal  Psych: Normal affect   Labs    Chemistry Recent Labs  Lab 06/29/17 0508 06/30/17 0442  NA 136 139  K 3.4* 4.1  CL 106 107  CO2 24 27    GLUCOSE 105* 113*  BUN 9 8  CREATININE 0.72 0.77  CALCIUM 8.9 8.7*  GFRNONAA >60 >60  GFRAA >60 >60  ANIONGAP 6 5     Hematology Recent Labs  Lab 06/29/17 0508  WBC 8.1  RBC 4.57  HGB 12.6  HCT 38.0  MCV 83.2  MCH 27.6  MCHC 33.2  RDW 13.3  PLT 305    Cardiac Enzymes Recent Labs  Lab 06/29/17 0844 06/29/17 1055 06/29/17 1405  TROPONINI <0.03 <0.03 <0.03    Recent Labs  Lab 06/29/17 0511  TROPIPOC 0.00     BNPNo results for input(s): BNP, PROBNP in the last 168 hours.   DDimer No results for input(s): DDIMER in the last 168 hours.   Radiology    Dg Chest 2 View  Result Date: 06/29/2017 CLINICAL DATA:  Mid to upper chest pain. History of lupus, cardiomyopathy, diabetes, CHF. EXAM: CHEST  2 VIEW COMPARISON:  Chest radiograph February 27, 2017 FINDINGS: Cardiac silhouette is mildly enlarged. Mediastinal silhouette is nonsuspicious. Prominent bronchovascular structures without pleural effusion or focal consolidation. LEFT cardiac defibrillator in situ. No pneumothorax. Soft tissue planes and included osseous structures are nonsuspicious. Surgical clips in the included right abdomen compatible with cholecystectomy. IMPRESSION: Mild cardiomegaly. Prominent bronchovascular structures seen with pulmonary edema, bronchitis or reactive airway disease. No focal consolidation. Electronically Signed  By: Elon Alas M.D.   On: 06/29/2017 06:17   Ct Angio Chest/abd/pel For Dissection W And/or Wo Contrast  Result Date: 06/29/2017 CLINICAL DATA:  Sharp central chest pain and pressure or. Pain is constant. Pain extends into the upper back between shoulders. Hemodynamically stable. EXAM: CT ANGIOGRAPHY CHEST, ABDOMEN AND PELVIS TECHNIQUE: Multidetector CT imaging through the chest, abdomen and pelvis was performed using the standard protocol during bolus administration of intravenous contrast. Multiplanar reconstructed images and MIPs were obtained and reviewed to evaluate the  vascular anatomy. CONTRAST:  188mL ISOVUE-370 IOPAMIDOL (ISOVUE-370) INJECTION 76% COMPARISON:  Two-view chest x-ray from the same day. CT of the abdomen and pelvis 12/05/2016. FINDINGS: CTA CHEST FINDINGS Cardiovascular: The heart is enlarged. Two right ventricular leads are in place. Coronary artery calcifications are present. No significant pericardial effusion is present. Scattered aortic calcifications are present. There are no displaced calcifications. Eccentric soft tissue plaque is present along the lateral aspect of the descending aorta. Adjacent calcification is present. There is no false lumen or dissection. Additional eccentric plaque is present in the descending aorta. There is no aneurysm of the thoracic aorta. Mediastinum/Nodes: No significant mediastinal or axillary adenopathy is present. The esophagus is dilated without mass. Pulmonary artery is are within normal limits. Lungs/Pleura: Mild ground-glass attenuation is present in the upper lobes bilaterally. No focal nodule, mass, or other significant airspace disease is present. There is no significant pleural effusion. Musculoskeletal: 12 rib-bearing thoracic type vertebral bodies are present. Vertebral body heights and alignment are normal. No focal lytic or blastic lesions are present. Ribs are within normal limits. Levels are intact bilaterally. The visualized scapula are unremarkable. Review of the MIP images confirms the above findings. CTA ABDOMEN AND PELVIS FINDINGS VASCULAR Aorta: The aorta is of normal size throughout the abdomen. Distal atherosclerotic changes are present without significant stenosis or aneurysm. Celiac: No significant stenosis.  Normal branch pattern. SMA: Patent without evidence of aneurysm, dissection, vasculitis or significant stenosis. Renals: Both renal arteries are patent without evidence of aneurysm, dissection, vasculitis, fibromuscular dysplasia or significant stenosis. IMA: Patent without evidence of aneurysm,  dissection, vasculitis or significant stenosis. Inflow: Patent without evidence of aneurysm, dissection, vasculitis or significant stenosis. Veins: No obvious venous abnormality within the limitations of this arterial phase study. Review of the MIP images confirms the above findings. NON-VASCULAR Hepatobiliary: An 8 mm arterially enhancing lesion is present anteriorly within the left lobe of the liver. A similar lesion is present more laterally in the right lobe of the liver. Both lesions are seen on venous phase imaging during previous study. Both lesions than normalize on delayed images. These lesions are stable back to 2013. Common bile duct is within normal limits following cholecystectomy. Pancreas: Unremarkable. No pancreatic ductal dilatation or surrounding inflammatory changes. Spleen: Normal in size without focal abnormality. Adrenals/Urinary Tract: The adrenal glands are normal bilaterally. Posterior right renal cyst is stable. Ureters and urinary bladder are within normal limits. Stomach/Bowel: The stomach and duodenum are within normal limits. Small bowel is unremarkable. Ileocolic junction is normal. The appendix is not discretely visualized and may be surgically absent. No inflammatory changes are present. The ascending and transverse colon are within normal limits. The descending colon is within normal limits. Diverticular present of the sigmoid colon. No significant inflammatory changes are present. Lymphatic: No significant adenopathy is present. Reproductive: Status post hysterectomy. No adnexal masses. Other: No abdominal wall hernia or abnormality. No abdominopelvic ascites. Musculoskeletal: Lumbar vertebral body heights are normal. Alignment is anatomic. Degenerative changes  are noted at the SI joints bilaterally. Pelvis is otherwise unremarkable. The hips are located and within normal limits. Review of the MIP images confirms the above findings. IMPRESSION: 1. Eccentric atherosclerotic changes  in the proximal descending aorta without evidence for dissection, aneurysm, or significant stenosis. 2. Aortic Atherosclerosis (ICD10-I70.0). Atherosclerotic changes in the distal aorta and proximal iliac vessels without significant stenosis or aneurysm. 3. Cardiomegaly without failure. 4. Coronary artery disease. 5. Ground-glass attenuation of the upper lobes bilaterally likely reflects atelectasis or edema. No discrete airspace disease is present. 6. Benign-appearing lesions of the liver likely represent flash filling hemangiomas. These are stable and no follow-up is necessary. Electronically Signed   By: San Morelle M.D.   On: 06/29/2017 07:01    Cardiac Studies  Echocardiogram 05/01/2017 Left ventricle: The cavity size was moderately dilated. There was   moderate concentric hypertrophy. Systolic function was severely   reduced. The estimated ejection fraction was in the range of 20%   to 25%. Diffuse hypokinesis. Features are consistent with a   pseudonormal left ventricular filling pattern, with concomitant   abnormal relaxation and increased filling pressure (grade 2   diastolic dysfunction). Doppler parameters are consistent with   high ventricular filling pressure. - Aortic valve: Transvalvular velocity was within the normal range.   There was no stenosis. There was no regurgitation. - Mitral valve: Transvalvular velocity was within the normal range.   There was no evidence for stenosis. There was moderate   regurgitation. - Left atrium: The atrium was severely dilated. - Right ventricle: The cavity size was normal. Wall thickness was   normal. Systolic function was mildly reduced. - Atrial septum: No defect or patent foramen ovale was identified   by color flow Doppler. - Tricuspid valve: There was mild regurgitation. - Pulmonary arteries: Systolic pressure was within the normal   range. PA peak pressure: 26 mm Hg (S).   Patient Profile     51 y.o. female with history  of recurrent VT, status post ICD in situ, coronary artery disease with history of PCI to the right coronary artery, ischemic cardia myopathy, chronic systolic heart failure with EF of 20-25% with grade 2 diastolic dysfunction, multiple medical problems, to include lupus and diabetes, we are following for recurrent chest pain.  Patient is followed by Dr. Remo Lipps Klein-electrophysiologist.  Assessment & Plan    1.  Chest pain: She denied any chest pain overnight.  It appears that the new medication mexiletine has been causing her to have chest discomfort.  At first she denied this as she just began taking it 1 week ago but when she received it yesterday she began to have chest discomfort. Meloxiteine was discontinued.  She has had no further complaints of recurrent chest pain.  Tolerated Lexiscan stress test with some intermittent chest discomfort.  Nuclear medicine scintigraphy to follow.  No acute ST-T wave changes noted during stress portion of EKG.  Troponin negative.  2. Ischemic cardiomyopathy: Most recent ejection fraction 20-25%.  Recheck of potassium this morning 4.1 with magnesium 2.1 prior to stress test.  Blood pressure is not optimal for reduced ejection fraction at 121/84.  Ranges of 104/70-120 5/89.  Recommend addition of ACE inhibitor or ARB.  Consider Entresto.  Creatinine 0.77.  Will begin low-dose Entresto this a.m. 21 mg/24 mg.  She will continue carvedilol 12.5 mg twice daily.  3.  Coronary artery disease: Hx of  PCI to the right coronary artery in 2011.  Remains on dual antiplatelet therapy with  aspirin and Plavix.Marland Kitchen  4.  Chronic combined systolic diastolic heart failure: She does not appear to have evidence of decompensation on exam, breath sounds are slightly diminished in the bases.  She has been taking Lasix as needed.  CT scan and chest x-ray is indeterminate CHF versus atelectasis.  5.  ICD in situ ([employer]): Last interrogation was completed 06/16/2017.  This was abnormal  revealing 3 VT episodes with ATP.  Follow-up interrogation as planned.   Signed, Phill Myron. West Pugh, ANP, AACC   06/30/2017, 8:49 AM    The patient was seen and examined, and I agree with the history, physical exam, assessment and plan as documented above, with modifications as noted below.  After stopping mexiletine, she has had no further chest pain. Stress test did not show ischemic territories.  After a long discussion with her, she has agreed to try amiodarone to try and suppress ventricular tachycardia.  K and Mg normal today.  Again, she does not require dual antiplatelet therapy at this time and Plavix can be stopped.  I would consider the addition of Entresto given her severely reduced LVEF/chronic systolic heart failure.  Disposition: She can be discharge today with follow up with Dr. Caryl Comes (EP).  Kate Sable, MD, [facility]  06/30/2017 11:24 AM

## 2017-06-30 NOTE — Progress Notes (Signed)
Remote ICD transmission.   

## 2017-06-30 NOTE — Telephone Encounter (Signed)
Spoke with pt and reminded pt of remote transmission that is due today. Pt verbalized understanding.   

## 2017-07-01 ENCOUNTER — Telehealth: Payer: Self-pay | Admitting: Family Medicine

## 2017-07-01 LAB — CUP PACEART REMOTE DEVICE CHECK
Battery Remaining Longevity: 103 mo
HIGH POWER IMPEDANCE MEASURED VALUE: 67 Ohm
Implantable Lead Implant Date: 20141031
Implantable Pulse Generator Implant Date: 20141031
Lead Channel Impedance Value: 399 Ohm
Lead Channel Pacing Threshold Amplitude: 0.625 V
Lead Channel Pacing Threshold Pulse Width: 0.4 ms
Lead Channel Sensing Intrinsic Amplitude: 8 mV
Lead Channel Sensing Intrinsic Amplitude: 8 mV
Lead Channel Setting Pacing Pulse Width: 0.4 ms
Lead Channel Setting Sensing Sensitivity: 0.3 mV
MDC IDC LEAD LOCATION: 753860
MDC IDC MSMT BATTERY VOLTAGE: 3 V
MDC IDC MSMT LEADCHNL RV IMPEDANCE VALUE: 475 Ohm
MDC IDC SESS DTM: 20181111213249
MDC IDC SET LEADCHNL RV PACING AMPLITUDE: 2.5 V
MDC IDC STAT BRADY RV PERCENT PACED: 0.01 %

## 2017-07-01 NOTE — Telephone Encounter (Signed)
Please make transitional call to pt d/c on 06/30/2017   Also pls sched a hospital follow up with me on 07/07/2017,  This will be an extra slot , so you may need to get help from [place] to add on/ work in [employer]

## 2017-07-01 NOTE — Telephone Encounter (Signed)
Transition Care Management Follow-up Telephone Call   Date discharged?  06/30/17              How have you been since you were released from the hospital? Not much better per patient   Do you understand why you were in the hospital? She states it has something to do with her medications and chest pain   Do you understand the discharge instructions? Yes   Where were you discharged to? Home   Items Reviewed:  Medications reviewed: Yes, no longer taking fenofibrate  Allergies reviewed: Yes  Dietary changes reviewed: Yes   Referrals reviewed: Yes   Functional Questionnaire:   Activities of Daily Living (ADLs):  Patient states she is able to do all ADLs independently     Any transportation issues/concerns?: No   Any patient concerns? No   Confirmed importance and date/time of follow-up visits scheduled    Yes, July 07, 2017 at 1 pm  Confirmed with patient if condition begins to worsen call PCP or go to the ER.  Patient was given the office number and encouraged to call back with question or concerns.  : She was informed

## 2017-07-01 NOTE — Telephone Encounter (Signed)
Pls see request to make transitional call

## 2017-07-03 ENCOUNTER — Telehealth: Payer: Self-pay | Admitting: Internal Medicine

## 2017-07-03 ENCOUNTER — Encounter: Payer: Self-pay | Admitting: Cardiology

## 2017-07-03 NOTE — Telephone Encounter (Signed)
Pt states she started taking amiodarone 200 mg daily while in hospital, discharged 06/30/17. Pt states starting yesterday she has had continuous headache, shortness of breath, and right eye almost completely swollen, no drainage/discharge. Pt did check BP while on the phone 114/75, HR 82. Pt denies LE edema, wheezing, angioedema, or other symptoms.  I reviewed with Dr Janelle Floor Dr Doyne Keel should go to ED for further evaluation of symtoms, stop amiodarone for now.  I discussed Dr Francesca Oman recommendations with pt, she verbalized understanding, agreed with plan.

## 2017-07-03 NOTE — Telephone Encounter (Signed)
New message  Patient calling  Pt c/o medication issue:  1. Name of Medication: amiodarone (PACERONE) 200 MG tablet  2. How are you currently taking this medication (dosage and times per day)? Take 1 tablet (200 mg total) daily by mouth.  3. Are you having a reaction (difficulty breathing--STAT)? Right eye swollen, headache   4. What is your medication issue?  Discuss with nurse.

## 2017-07-07 ENCOUNTER — Encounter: Payer: Self-pay | Admitting: Family Medicine

## 2017-07-07 ENCOUNTER — Telehealth: Payer: Self-pay

## 2017-07-07 ENCOUNTER — Ambulatory Visit (INDEPENDENT_AMBULATORY_CARE_PROVIDER_SITE_OTHER): Payer: Medicare Other | Admitting: Family Medicine

## 2017-07-07 ENCOUNTER — Other Ambulatory Visit: Payer: Self-pay | Admitting: Family Medicine

## 2017-07-07 ENCOUNTER — Ambulatory Visit: Payer: Medicare Other | Admitting: Family Medicine

## 2017-07-07 VITALS — BP 102/74 | HR 77 | Temp 98.1°F | Resp 16 | Ht 66.0 in | Wt 208.0 lb

## 2017-07-07 DIAGNOSIS — F17210 Nicotine dependence, cigarettes, uncomplicated: Secondary | ICD-10-CM

## 2017-07-07 DIAGNOSIS — I4729 Other ventricular tachycardia: Secondary | ICD-10-CM | POA: Insufficient documentation

## 2017-07-07 DIAGNOSIS — F17218 Nicotine dependence, cigarettes, with other nicotine-induced disorders: Secondary | ICD-10-CM

## 2017-07-07 DIAGNOSIS — I472 Ventricular tachycardia: Secondary | ICD-10-CM | POA: Insufficient documentation

## 2017-07-07 DIAGNOSIS — I10 Essential (primary) hypertension: Secondary | ICD-10-CM | POA: Diagnosis not present

## 2017-07-07 DIAGNOSIS — F329 Major depressive disorder, single episode, unspecified: Secondary | ICD-10-CM | POA: Insufficient documentation

## 2017-07-07 DIAGNOSIS — Z09 Encounter for follow-up examination after completed treatment for conditions other than malignant neoplasm: Secondary | ICD-10-CM | POA: Diagnosis not present

## 2017-07-07 DIAGNOSIS — G4700 Insomnia, unspecified: Secondary | ICD-10-CM | POA: Insufficient documentation

## 2017-07-07 DIAGNOSIS — I5022 Chronic systolic (congestive) heart failure: Secondary | ICD-10-CM | POA: Diagnosis not present

## 2017-07-07 DIAGNOSIS — I219 Acute myocardial infarction, unspecified: Secondary | ICD-10-CM | POA: Insufficient documentation

## 2017-07-07 DIAGNOSIS — I5020 Unspecified systolic (congestive) heart failure: Secondary | ICD-10-CM | POA: Insufficient documentation

## 2017-07-07 DIAGNOSIS — E118 Type 2 diabetes mellitus with unspecified complications: Secondary | ICD-10-CM

## 2017-07-07 DIAGNOSIS — I4901 Ventricular fibrillation: Secondary | ICD-10-CM | POA: Insufficient documentation

## 2017-07-07 DIAGNOSIS — F419 Anxiety disorder, unspecified: Secondary | ICD-10-CM

## 2017-07-07 DIAGNOSIS — F32A Depression, unspecified: Secondary | ICD-10-CM | POA: Insufficient documentation

## 2017-07-07 DIAGNOSIS — M329 Systemic lupus erythematosus, unspecified: Secondary | ICD-10-CM | POA: Insufficient documentation

## 2017-07-07 DIAGNOSIS — Z23 Encounter for immunization: Secondary | ICD-10-CM | POA: Diagnosis not present

## 2017-07-07 DIAGNOSIS — G43909 Migraine, unspecified, not intractable, without status migrainosus: Secondary | ICD-10-CM | POA: Insufficient documentation

## 2017-07-07 DIAGNOSIS — M1710 Unilateral primary osteoarthritis, unspecified knee: Secondary | ICD-10-CM | POA: Insufficient documentation

## 2017-07-07 DIAGNOSIS — Z720 Tobacco use: Secondary | ICD-10-CM | POA: Insufficient documentation

## 2017-07-07 LAB — CBC
HCT: 35.4 % (ref 35.0–45.0)
Hemoglobin: 11.9 g/dL (ref 11.7–15.5)
MCH: 26.9 pg — ABNORMAL LOW (ref 27.0–33.0)
MCHC: 33.6 g/dL (ref 32.0–36.0)
MCV: 79.9 fL — ABNORMAL LOW (ref 80.0–100.0)
MPV: 10.4 fL (ref 7.5–12.5)
PLATELETS: 294 10*3/uL (ref 140–400)
RBC: 4.43 10*6/uL (ref 3.80–5.10)
RDW: 12.7 % (ref 11.0–15.0)
WBC: 5.3 10*3/uL (ref 3.8–10.8)

## 2017-07-07 LAB — BASIC METABOLIC PANEL
BUN: 8 mg/dL (ref 7–25)
CO2: 26 mmol/L (ref 20–32)
Calcium: 8.7 mg/dL (ref 8.6–10.4)
Chloride: 108 mmol/L (ref 98–110)
Creat: 0.77 mg/dL (ref 0.50–1.05)
GLUCOSE: 106 mg/dL (ref 65–139)
Potassium: 4 mmol/L (ref 3.5–5.3)
SODIUM: 139 mmol/L (ref 135–146)

## 2017-07-07 MED ORDER — ALPRAZOLAM 1 MG PO TABS: 1.0000 mg | ORAL_TABLET | Freq: Two times a day (BID) | ORAL | 3 refills | Status: DC | PRN

## 2017-07-07 MED ORDER — ALPRAZOLAM 1 MG PO TABS: ORAL_TABLET | ORAL | 3 refills | Status: DC

## 2017-07-07 NOTE — Patient Instructions (Addendum)
F/u in 4 months, call if you need me sooner  Need to stop cigarettes  Flu vaccine tody  CBC and chem 7 today  We will call for sooner appt with Dr Jens Som only if possible,. Stop by after lab draw and see if able

## 2017-07-07 NOTE — Telephone Encounter (Signed)
DR SIMPSON WANTS EARLIER APPT WITH CARDIO DR [name] IN [place] BECAUSE PATIENT IS NOT ON ANY HEART MEDICATION AND HER APPT IS TOO FAR AWAY.

## 2017-07-07 NOTE — Progress Notes (Signed)
[name]     MRN: [id number]      DOB: [birth year]   HPI [name] is here for follow up of recent hospitalization whn she had an adverse reaction to medication started on for heart disease Currently reports excessive exertional fatigue, SOB walking from one room to the other in her home, using the same 3 pillows at night   However not sleeping , has chronic insomnia, denies PND or edema, no chest pain , has had intermittent palpitations, feels it when she lies down. Her anxiety level is increased and she needs sooner appt with cardiologist , currently only taking lipitor , coreg and aspirin 81mg    Noticably not on amiderone which was started at hospital and d/c after she left , pt states allergic give headache and facial swelling, was told by cardiolgy nurse to d/c post discharge  ROS Denies recent fever or chills. Denies sinus pressure, nasal congestion, ear pain or sore throat. Denies chest congestion, c/o cough with sputum, denies wheezing. .   Denies dysuria, frequency, hesitancy or incontinence. Denies joint pain, swelling and limitation in mobility. Denies headaches, seizures, numbness, or tingling. Denies depression, anxiety or insomnia. Denies skin break down or rash.   PE  BP 102/74   Pulse 77   Temp 98.1 F (36.7 C) (Oral)   Resp 16   Ht 5\' 6"  (1.676 m)   Wt 208 lb (94.3 kg)   SpO2 91%   BMI 33.57 kg/m   Patient alert and oriented and in no cardiopulmonary distress.  HEENT: No facial asymmetry, EOMI,   oropharynx pink and moist.  Neck supple no JVD, no mass.  Chest: Clear to auscultation bilaterally.  CVS: S1, S2 no murmurs, no S3.Regular rate.  ABD: Soft non tender.   Ext: No edema  MS: Adequate ROM spine, shoulders, hips and knees.  Skin: Intact, no ulcerations or rash noted.  Psych: Good eye contact, normal affect. Memory intact not anxious or depressed appearing.  CNS: CN 2-12 intact, power,  normal throughout.no focal deficits  noted.   [facility] discharge follow-up Hos[pitalized from 11/12 to 11/13 with c/o chest pressure which she relates to a new  Medication which was started for her heart disease Currently only on coreg as she was d/c on amidapsone but c/o facial swelling and headache and discontinued this on instructions from the cardiology clinic , reportedly Reports poor exercise tolerance, denies chest pain, PND or orthopnea or swelling. Has cardiology f/u scheduled later than proposed and unsuccessfully attempt to get sooner appointment with her cardiologist at this time Stable enough to hold until scheduled appt in my opinion at this time, if she decompensates she needs to go to the ED, she specifically requests her cardiologist, which I think is best based on recent history surrounding her hospitaliztion  Nicotine dependence Patient is asked and  confirms current  Nicotine use.  Five to seven minutes of time is spent in counseling the patient of the need to quit smoking  Advice to quit is delivered clearly specifically in reducing the risk of developing heart disease, having a stroke, or of developing all types of cancer, especially lung and oral cancer. Improvement in breathing and exercise tolerance and quality of life is also discussed, as is the economic benefit.  Assessment of willingness to quit or to make an attempt to quit is made and documented  Assistance in quit attempt is made with several and varied options presented, based on patient's desire  and need. These include  literature, local classes available, 1800 QUIT NOW number, OTC and prescription medication.  The GOAL to be NICOTINE FREE is re emphasized.  The patient has set a personal goal of either reduction or discontinuation and follow up is arranged between 6 an 16 weeks.    Systolic CHF (HCC) No current decompensation  HTN, goal below 130/80 Controlled, no change in medication DASH diet and commitment to  daily physical activity for a minimum of 30 minutes discussed and encouraged, as a part of hypertension management. The importance of attaining a healthy weight is also discussed.  BP/Weight 07/07/2017 06/30/2017 06/29/2017 06/18/2017 05/15/2017 03/24/2017 4/49/6759  Systolic BP 163 846 - 659 88 935 701  Diastolic BP 74 76 - 68 60 72 78  Wt. (Lbs) 208 - 209.8 206 210 208.8 -  BMI 33.57 - 33.86 33.25 33.89 33.7 -

## 2017-07-08 ENCOUNTER — Encounter: Payer: Self-pay | Admitting: Family Medicine

## 2017-07-11 ENCOUNTER — Encounter: Payer: Self-pay | Admitting: Family Medicine

## 2017-07-11 DIAGNOSIS — Z09 Encounter for follow-up examination after completed treatment for conditions other than malignant neoplasm: Secondary | ICD-10-CM | POA: Insufficient documentation

## 2017-07-11 NOTE — Assessment & Plan Note (Signed)
No current decompensation

## 2017-07-11 NOTE — Assessment & Plan Note (Signed)
Hos[pitalized from 11/12 to 11/13 with c/o chest pressure which she relates to a new  Medication which was started for her heart disease Currently only on coreg as she was d/c on amidapsone but c/o facial swelling and headache and discontinued this on instructions from the cardiology clinic , reportedly Reports poor exercise tolerance, denies chest pain, PND or orthopnea or swelling. Has cardiology f/u scheduled later than proposed and unsuccessfully attempt to get sooner appointment with her cardiologist at this time Stable enough to hold until scheduled appt in my opinion at this time, if she decompensates she needs to go to the ED, she specifically requests her cardiologist, which I think is best based on recent history surrounding her hospitaliztion

## 2017-07-11 NOTE — Assessment & Plan Note (Signed)
Controlled, no change in medication DASH diet and commitment to daily physical activity for a minimum of 30 minutes discussed and encouraged, as a part of hypertension management. The importance of attaining a healthy weight is also discussed.  BP/Weight 07/07/2017 06/30/2017 06/29/2017 06/18/2017 05/15/2017 03/24/2017 5/62/5638  Systolic BP 937 342 - 876 88 811 572  Diastolic BP 74 76 - 68 60 72 78  Wt. (Lbs) 208 - 209.8 206 210 208.8 -  BMI 33.57 - 33.86 33.25 33.89 33.7 -

## 2017-07-11 NOTE — Assessment & Plan Note (Signed)

## 2017-07-13 ENCOUNTER — Other Ambulatory Visit: Payer: Self-pay | Admitting: Family Medicine

## 2017-07-16 ENCOUNTER — Ambulatory Visit (INDEPENDENT_AMBULATORY_CARE_PROVIDER_SITE_OTHER): Payer: Medicare Other

## 2017-07-16 DIAGNOSIS — I5022 Chronic systolic (congestive) heart failure: Secondary | ICD-10-CM

## 2017-07-16 DIAGNOSIS — T827XXA Infection and inflammatory reaction due to other cardiac and vascular devices, implants and grafts, initial encounter: Secondary | ICD-10-CM

## 2017-07-17 NOTE — Progress Notes (Signed)
EPIC Encounter for ICM Monitoring  Patient Name: [name] is a 51 y.o. female Date: 07/17/2017 Primary Care Physican: Fayrene Helper, MD Primary Cardiologist: Caryl Comes Electrophysiologist: Faustino Congress Weight:Does not weigh      Heart Failure questions reviewed, pt asymptomatic today but did have hand swelling.  She took 3 days of Furosemide and Potassium this week with relief of symptoms and imepdance returned to normal.  ER visit 11/12 to 11/13   Thoracic impedance normal today but was abnormal suggesting fluid accumulation from 06/24/2017 until today.  Prescribed dosage: Furosemide 20 mg 2 tablets (40 mg total) as needed. Potassium 20 mEq 1 tablet (20 mEq total) by mouth on days when Furosemide is taken.   Labs: 07/07/2017 Creatinine 0.77, BUN 8, Potassium 4.0, Sodium 139 06/30/2017 Creatinine 0.77, BUN 8, Potassium 4.1, Sodium 139, EGFR >60  06/29/2017 Creatinine 0.72, BUN 8, Potassium 4.1, Sodium 139, EGFR >60  03/23/2017 Creatinine 0.72, BUN 6, Potassium 4.1, Sodium 138 03/04/2017 Creatinine 0.72, BUN 5, Potassium 4.6, Sodium 140, EGFR 97-111  03/02/2017 Creatinine 0.68, BUN 7, Potassium 4.3, Sodium 139, EGFR >89  02/27/2017 Creatinine 0.69, BUN 7, Potassium 2.9, Sodium 136, EGFR >60 (drawn in ER) 02/23/2017 Creatinine 0.72, BUN 8, Potassium 4.6, Sodium 141, EGFR >60 12/30/2016 Creatinine 0.70, BUN 4, Potassium 4.2, Sodium 140, EGFR 100-115 12/05/2016 Creatinine 0.72, BUN 7, Potassium 2.8, Sodium 138, EGFR >60  09/22/2016 Creatinine 0.80, BUN 10, Potassium 4.3, Sodium 138   Recommendations: No changes.   Encouraged to call for fluid symptoms.  Follow-up plan: ICM clinic phone appointment on 08/20/2017.  Office appointment scheduled 07/20/2017 with Dr. Caryl Comes.  Copy of ICM check sent to Dr. Caryl Comes.   3 month ICM trend: 07/16/2017    1 Year ICM trend:       [name], RN 07/17/2017 12:41 PM

## 2017-07-20 ENCOUNTER — Ambulatory Visit: Payer: Medicare Other | Admitting: Family Medicine

## 2017-07-20 ENCOUNTER — Ambulatory Visit (INDEPENDENT_AMBULATORY_CARE_PROVIDER_SITE_OTHER): Payer: Medicare Other | Admitting: Internal Medicine

## 2017-07-20 ENCOUNTER — Encounter: Payer: Self-pay | Admitting: Internal Medicine

## 2017-07-20 VITALS — BP 109/76 | HR 76 | Ht 66.0 in | Wt 209.6 lb

## 2017-07-20 DIAGNOSIS — I5022 Chronic systolic (congestive) heart failure: Secondary | ICD-10-CM | POA: Diagnosis not present

## 2017-07-20 DIAGNOSIS — I255 Ischemic cardiomyopathy: Secondary | ICD-10-CM

## 2017-07-20 DIAGNOSIS — I4901 Ventricular fibrillation: Secondary | ICD-10-CM | POA: Diagnosis not present

## 2017-07-20 DIAGNOSIS — T827XXA Infection and inflammatory reaction due to other cardiac and vascular devices, implants and grafts, initial encounter: Secondary | ICD-10-CM

## 2017-07-20 MED ORDER — LOSARTAN POTASSIUM 25 MG PO TABS: 25.0000 mg | ORAL_TABLET | Freq: Every day | ORAL | 3 refills | Status: DC

## 2017-07-20 MED ORDER — ATORVASTATIN CALCIUM 10 MG PO TABS: 10.0000 mg | ORAL_TABLET | Freq: Every day | ORAL | 2 refills | Status: DC

## 2017-07-20 NOTE — Patient Instructions (Addendum)
Medication Instructions:  Your physician has recommended you make the following change in your medication: 1. START Cozaar (Losartan) 25 mg daily  *If you need a refill on your cardiac medications before your next appointment, please call your pharmacy*  Labwork: None ordered  Testing/Procedures: None ordered  Follow-Up: Remote monitoring is used to monitor your Pacemaker or ICD from home. This monitoring reduces the number of office visits required to check your device to one time per year. It allows [place] to keep an eye on the functioning of your device to ensure it is working properly. You are scheduled for a device check from home on 09/14/2017. You may send your transmission at any time that day. If you have a wireless device, the transmission will be sent automatically. After your physician reviews your transmission, you will receive a postcard with your next transmission date.  Your physician recommends that you schedule a follow-up appointment ASAP with the heart failure clinic.  Your physician recommends that you schedule a follow-up appointment in: 4 months with Dr. Caryl Comes  Thank you for choosing [facility]!!

## 2017-07-20 NOTE — Progress Notes (Signed)
Electrophysiology Office Note   Date:  07/20/2017   ID:  [name], DOB 10/12/1965, MRN [id number]  PCP:  Fayrene Helper, MD  Cardiologist:   Primary Electrophysiologist:    Chief Complaint  Patient presents with  . Defib Check    Ischemic Cardiomyopathy     History of Present Illness: [name] is a 51 y.o. female  Seen in followup for  s/p ICD implanted for primary prevention and 10/14 underwent generator replacement with revision of her 6949-lead.   Recent nuclear imaging (3/11) listed under NOTES-CV procedure--demonstrated ejection fraction of 30% with an infarct and no ischemia  she is a prior inferior wall MI he treated with stenting.  Echocardiogram 10/14 demonstrated "severe depressed LV function"   DATE TEST    10/17 Echo    EF Severe depression      8/18 Echo   EF 20-25 % LAE(43/2.01/42)  11/18 Myoview EF 13% Large scar w/o ischemia    Seen last month she had episodes of ventricular tachycardia that were sustained below her detection rate. Monitoring function was activated She has had intercurrent  Ventricular tachycardia and successful ATP but rapid recurring of monomorphic VT.    This occurred in the context of her having missed carvedilol because of refill issues. The plan was to reassess DVT/ATP with initiation of mexiletine in the event that there is ongoing ventricular tachycardia.  In the wake of this she developed chest pain and ended up in the hospital.  She was switched to amiodarone.   This was discontinued because of swelling  She feels terrible.  She is short of breath.  She is tired.  She denies chest pain.  She wants to go back on her previous medications.    Appropriate Therapy yes ATP 2018 Inappropriate Therapy  *  Antiarrhythmics Date Reason stopped  mexiletine 2018 intolerance  Amiodarone 2018 allergy this afternoon is starting we are at 145 gets the urge of going rewarmed back    Date Cr K Mg  8/18  0.72 4.1 1.8     11/18  0.72 4.1 2.1      Past Medical History:  Diagnosis Date  . Arthritis of knee   . Automatic implantable cardiac defibrillator in situ    a. s/p prior Medtronic ICD with 6949 lead. b. s/p Gen change & lead revision 05/2013.  . Cardiomyopathy secondary    Patient has cardiomyopathy out of proportion to her ischemic heart disease  . coronary artery disease    RCA stenting Myoview 2011 EF 30% infarction dilatation without ischemia  . Depression   . Diabetes mellitus   . GERD (gastroesophageal reflux disease)   . Insomnia   . Lupus (systemic lupus erythematosus) ([place])   . Migraines   . Myocardial infarction ([place])    8 total   . Nicotine abuse   . Other and unspecified hyperlipidemia   . Paroxysmal VT ([place])   . Systolic CHF ([place])   . VF (ventricular fibrillation) ([place])    a. Hx appropriate ICD therapy for VF.    Current Outpatient Medications  Medication Sig Dispense Refill  . albuterol (PROVENTIL HFA;VENTOLIN HFA) 108 (90 Base) MCG/ACT inhaler Inhale 2 puffs into the lungs every 6 (six) hours as needed for wheezing or shortness of breath. 1 Inhaler 0  . ALPRAZolam (XANAX) 1 MG tablet One tablet at bedtime 30 tablet 3  . aspirin 81 MG tablet Take 1 tablet (81 mg total) by mouth daily. 30 tablet 6  .  atorvastatin (LIPITOR) 10 MG tablet Take 1 tablet (10 mg total) by mouth daily. 90 tablet 1  . budesonide-formoterol (SYMBICORT) 80-4.5 MCG/ACT inhaler Inhale 2 puffs into the lungs 2 (two) times daily. 1 Inhaler 3  . carvedilol (COREG) 12.5 MG tablet Take 1 tablet (12.5 mg total) by mouth 2 (two) times daily. 60 tablet 6  . cyclobenzaprine (FLEXERIL) 5 MG tablet TAKE ONE TABLET BY MOUTH TWICE DAILY AS NEEDED FOR LEFT NECK SPASM AND ARM PAIN. 20 tablet 0  . fenofibrate (TRICOR) 145 MG tablet Take 145 mg by mouth daily.     . furosemide (LASIX) 20 MG tablet Take 2 tablets (40 mg total) by mouth as needed for fluid and edema 30 tablet 6  . oxyCODONE (ROXICODONE) 15 MG immediate  release tablet Take 30 mg 2 (two) times daily by mouth.     . potassium chloride SA (K-DUR,KLOR-CON) 20 MEQ tablet Take 1 tablet (20 mEq total) by mouth on days when Furosemide is taken. (Patient taking differently: Take 20 mEq by mouth daily. Take 1 tablet (20 mEq total) by mouth on days when Furosemide is taken.) 30 tablet 6   No current facility-administered medications for this visit.     Allergies:   Amiodarone; Bee venom; Mexiletine; Cortisone; and Robaxin [methocarbamol]   Social History:  The patient  reports that she has been smoking cigarettes.  She has a 7.50 pack-year smoking history. she has never used smokeless tobacco. She reports that she drinks alcohol. She reports that she does not use drugs.   Family History:  The patient's family history includes Cancer in her mother; Dementia in her father; Diabetes in her brother, mother, and sister; HIV in her sister; Heart disease in her brother and mother; Hepatitis (age of onset: 72) in her mother; Hyperlipidemia in her brother, mother, and sister; Liver cancer (age of onset: 5) in her mother; Stroke (age of onset: 54) in her sister.    ROS:  Please see the history of present illness.   Positive for ,   All other systems are reviewed and negative.    PHYSICAL EXAM: VS:  BP 109/76   Pulse 76   Ht 5\' 6"  (1.676 m)   Wt 209 lb 9.6 oz (95.1 kg)   BMI 33.83 kg/m  , BMI Body mass index is 33.83 kg/m. Well developed and nourished in no acute distress HENT normal Neck supple with JVP-7 Clear Regular rate and rhythm, no murmurs or gallops Abd-soft with active BS No Clubbing cyanosis no edema Skin-warm and dry A & Oriented  Grossly normal sensory and motor function   EKG: Sinus at 76 16/12/44 LVH with repolarization .   Device interrogation is reviewed today in detail.  See PaceArt for details.  Recent Labs: 09/22/2016: TSH 1.55 03/02/2017: ALT 15 06/30/2017: Magnesium 2.1 07/07/2017: BUN 8; Creat 0.77; Hemoglobin 11.9;  Platelets 294; Potassium 4.0; Sodium 139  06/29/2017: Cholesterol 106; HDL 28; LDL Cholesterol 63; Total CHOL/HDL Ratio 3.8; Triglycerides 75; VLDL 15     Estimated Creatinine Clearance: 94.5 mL/min (by C-G formula based on SCr of 0.77 mg/dL).   Wt Readings from Last 3 Encounters:  07/20/17 209 lb 9.6 oz (95.1 kg)  07/07/17 208 lb (94.3 kg)  06/29/17 209 lb 12.8 oz (95.2 kg)         ASSESSMENT AND PLAN:  Ischemic/nonischemic cardiomyopathy  Implantable defibrillator-Medtronic S./P. revision   Ventricular tachycardia recurrent  Congestive heart failure-chronic-systolic  She is euvolemic but still very symptomatic.  This is  concerning. We will start her on losartan 25 mg.  Right now we will continue her on carvedilol 12.5 twice daily.  We will arrange for her to see the heart failure clinic as an outpatient for her class III symptoms which are ever more worrisome in the context of her being euvolemic  I wonder whether her ventricular tachycardia is aggravated by electromechanical factors.  She is loath at this point to try further antiarrhythmic therapy.  Her options are extremely limited.  Probably our only left over option is ranolazine.  At this point, I would also concerned about stability for catheter ablation  Major emphasis now with   heart failure medications and heart failure therapy      Signed, Virl Axe   07/20/2017 1:01 PM      [address] [place] [id number]  [phone] (office) [phone] (fax)

## 2017-07-23 ENCOUNTER — Encounter: Payer: Self-pay | Admitting: Family Medicine

## 2017-07-24 ENCOUNTER — Encounter (HOSPITAL_COMMUNITY): Payer: Self-pay | Admitting: Internal Medicine

## 2017-07-24 ENCOUNTER — Ambulatory Visit (HOSPITAL_COMMUNITY)
Admission: RE | Admit: 2017-07-24 | Discharge: 2017-07-24 | Disposition: A | Discharge status: Home / Self Care | Payer: Medicare Other | Source: Ambulatory Visit | Attending: Internal Medicine | Admitting: Internal Medicine

## 2017-07-24 VITALS — BP 112/70 | HR 78 | Wt 207.4 lb

## 2017-07-24 DIAGNOSIS — E785 Hyperlipidemia, unspecified: Secondary | ICD-10-CM | POA: Diagnosis not present

## 2017-07-24 DIAGNOSIS — Z955 Presence of coronary angioplasty implant and graft: Secondary | ICD-10-CM | POA: Diagnosis not present

## 2017-07-24 DIAGNOSIS — F17210 Nicotine dependence, cigarettes, uncomplicated: Secondary | ICD-10-CM | POA: Diagnosis not present

## 2017-07-24 DIAGNOSIS — I110 Hypertensive heart disease with heart failure: Secondary | ICD-10-CM | POA: Insufficient documentation

## 2017-07-24 DIAGNOSIS — M329 Systemic lupus erythematosus, unspecified: Secondary | ICD-10-CM | POA: Insufficient documentation

## 2017-07-24 DIAGNOSIS — I252 Old myocardial infarction: Secondary | ICD-10-CM | POA: Diagnosis not present

## 2017-07-24 DIAGNOSIS — I5022 Chronic systolic (congestive) heart failure: Secondary | ICD-10-CM

## 2017-07-24 DIAGNOSIS — Z720 Tobacco use: Secondary | ICD-10-CM | POA: Diagnosis not present

## 2017-07-24 DIAGNOSIS — I5023 Acute on chronic systolic (congestive) heart failure: Secondary | ICD-10-CM

## 2017-07-24 DIAGNOSIS — I472 Ventricular tachycardia, unspecified: Secondary | ICD-10-CM

## 2017-07-24 DIAGNOSIS — I428 Other cardiomyopathies: Secondary | ICD-10-CM | POA: Diagnosis not present

## 2017-07-24 DIAGNOSIS — Z79899 Other long term (current) drug therapy: Secondary | ICD-10-CM | POA: Insufficient documentation

## 2017-07-24 DIAGNOSIS — K219 Gastro-esophageal reflux disease without esophagitis: Secondary | ICD-10-CM | POA: Insufficient documentation

## 2017-07-24 DIAGNOSIS — G4700 Insomnia, unspecified: Secondary | ICD-10-CM | POA: Insufficient documentation

## 2017-07-24 DIAGNOSIS — I10 Essential (primary) hypertension: Secondary | ICD-10-CM

## 2017-07-24 DIAGNOSIS — F329 Major depressive disorder, single episode, unspecified: Secondary | ICD-10-CM | POA: Diagnosis not present

## 2017-07-24 DIAGNOSIS — I2510 Atherosclerotic heart disease of native coronary artery without angina pectoris: Secondary | ICD-10-CM | POA: Diagnosis not present

## 2017-07-24 DIAGNOSIS — E119 Type 2 diabetes mellitus without complications: Secondary | ICD-10-CM | POA: Insufficient documentation

## 2017-07-24 DIAGNOSIS — Z7982 Long term (current) use of aspirin: Secondary | ICD-10-CM | POA: Insufficient documentation

## 2017-07-24 DIAGNOSIS — Z95810 Presence of automatic (implantable) cardiac defibrillator: Secondary | ICD-10-CM | POA: Insufficient documentation

## 2017-07-24 MED ORDER — FUROSEMIDE 20 MG PO TABS: 40.0000 mg | ORAL_TABLET | Freq: Every day | ORAL | 6 refills | Status: DC

## 2017-07-24 MED ORDER — SPIRONOLACTONE 25 MG PO TABS: 12.5000 mg | ORAL_TABLET | Freq: Every day | ORAL | 3 refills | Status: DC

## 2017-07-24 NOTE — Patient Instructions (Signed)
Start taking Furosemide (Lasix) 40 mg (2 tabs) every day  Stop taking Potassim  Start taking Spironolactone 12.5 mg (1/2 tab) daily  Please STOP SMOKING  Your physician recommends that you schedule a follow-up appointment in: 1 week

## 2017-07-24 NOTE — Progress Notes (Signed)
ADVANCED HF CLINIC CONSULT NOTE  Referring Physician: Dr, Caryl Comes Primary Care: Tula Nakayama, MD Primary Cardiologist: Dr. Caryl Comes  HPI:  [name] is a 51 y.o. female with h/o CAD s/p RCA stenting in 7253, severe systolic HF due to mixed cardiomyopathy EF , recurrent VT s/p MDT ICD, SLE, GERD and ongoing tobacco use referred by Dr. Caryl Comes for further evaluation and treatment of HF.   EF 8/18: EF 20-25%. RV normal. Dilated LV with moderate MR   Last cath 2004 EF 29% Left main has a distal 30% stenosis. Left anterior descending artery has an ostial 30% stenosis.    Left circumflex gives rise to a very large branching ramus intermedius, two  small marginal branches, and two small posterolateral branches.  The ramus  intermedius gives off two branches.  The more medial branch has a 95%  stenosis at its ostium.  However, this is a small caliber branch being less  than or equal to 1.5 mm in diameter.  The circumflex otherwise has minor  luminal irregularities, but no significant stenoses.  Right coronary artery has a 20% stenosis in the mid vessel.  Just distal to  this there is a stent in the distal portion of the mid vessel which is  widely patent with 0% stenosis within the stent.    CT scan chest: 11/18. No PE. Mild ground glass opacitites. No COPD mentioned  Used to smoke 2ppd. Now smokes 1/2 ppd.   Says she was doing ok until last month when she was started on mexilitene. Developed severe CP and got admitted to [facility]. Troponin negative. Was subsequently switched to amiodarone and says her faced swelled up. Had Myoview at [facility]. Says gets SOB with minimal activity. + PND and orthopnea. Takes lasix when she feels she needs it. Took 2 yesterday. No CP. Weight up and down. Previously was 195 and now 205-210.  Carvedilol increased to 12.5 bid about 2-3 months ago    Myoview 06/30/17: EF 13% There is a large defect of severe severity present in the basal anteroseptal, basal  inferoseptal, basal inferior, mid anteroseptal, mid inferoseptal, mid inferior and apical inferior location.    Review of Systems: [y] = yes, [ ]  = no   General: Weight gain [ ] ; Weight loss [ ] ; Anorexia [ ] ; Fatigue Blue.Reese ]; Fever [ ] ; Chills [ ] ; Weakness [ ]   Cardiac: Chest pain/pressure Blue.Reese ]; Resting SOB Blue.Reese ]; Exertional SOB Blue.Reese ]; Orthopnea [ y]; Pedal Edema Blue.Reese ]; Palpitations Blue.Reese ]; Syncope [ ] ; Presyncope [ ] ; Paroxysmal nocturnal dyspnea[ y]  Pulmonary: Cough Blue.Reese ]; Wheezing[ ] ; Hemoptysis[ ] ; Sputum [ ] ; Snoring [ ]   GI: Vomiting[ ] ; Dysphagia[ ] ; Melena[ ] ; Hematochezia [ ] ; Heartburn[ ] ; Abdominal pain [ ] ; Constipation [ ] ; Diarrhea [ ] ; BRBPR [ ]   GU: Hematuria[ ] ; Dysuria [ ] ; Nocturia[ ]   Vascular: Pain in legs with walking [ ] ; Pain in feet with lying flat [ ] ; Non-healing sores [ ] ; Stroke [ ] ; TIA [ ] ; Slurred speech [ ] ;  Neuro: Headaches[ ] ; Vertigo[ ] ; Seizures[ ] ; Paresthesias[ ] ;Blurred vision [ ] ; Diplopia [ ] ; Vision changes [ ]   Ortho/Skin: Arthritis Blue.Reese ]; Joint pain Blue.Reese ]; Muscle pain [ ] ; Joint swelling [ ] ; Back Pain [ ] ; Rash [ ]   Psych: Depression[ ] ; Anxiety[ ]   Heme: Bleeding problems [ ] ; Clotting disorders [ ] ; Anemia [ ]   Endocrine: Diabetes [ ] ; Thyroid dysfunction[ ]    Past Medical History:  Diagnosis Date  . Arthritis of knee   . Automatic implantable cardiac defibrillator in situ    a. s/p prior Medtronic ICD with 6949 lead. b. s/p Gen change & lead revision 05/2013.  . Cardiomyopathy secondary    Patient has cardiomyopathy out of proportion to her ischemic heart disease  . coronary artery disease    RCA stenting Myoview 2011 EF 30% infarction dilatation without ischemia  . Depression   . Diabetes mellitus   . GERD (gastroesophageal reflux disease)   . Insomnia   . Lupus (systemic lupus erythematosus) ([place])   . Migraines   . Myocardial infarction ([place])    8 total   . Nicotine abuse   . Other and unspecified hyperlipidemia   . Paroxysmal VT ([place])    . Systolic CHF ([place])   . VF (ventricular fibrillation) ([place])    a. Hx appropriate ICD therapy for VF.    Current Outpatient Medications  Medication Sig Dispense Refill  . albuterol (PROVENTIL HFA;VENTOLIN HFA) 108 (90 Base) MCG/ACT inhaler Inhale 2 puffs into the lungs every 6 (six) hours as needed for wheezing or shortness of breath. 1 Inhaler 0  . ALPRAZolam (XANAX) 1 MG tablet One tablet at bedtime 30 tablet 3  . aspirin 81 MG tablet Take 1 tablet (81 mg total) by mouth daily. 30 tablet 6  . atorvastatin (LIPITOR) 10 MG tablet Take 1 tablet (10 mg total) by mouth daily. 90 tablet 2  . budesonide-formoterol (SYMBICORT) 80-4.5 MCG/ACT inhaler Inhale 2 puffs into the lungs 2 (two) times daily. 1 Inhaler 3  . carvedilol (COREG) 12.5 MG tablet Take 1 tablet (12.5 mg total) by mouth 2 (two) times daily. 60 tablet 6  . cyclobenzaprine (FLEXERIL) 5 MG tablet TAKE ONE TABLET BY MOUTH TWICE DAILY AS NEEDED FOR LEFT NECK SPASM AND ARM PAIN. 20 tablet 0  . fenofibrate (TRICOR) 145 MG tablet Take 145 mg by mouth daily.     . furosemide (LASIX) 20 MG tablet Take 2 tablets (40 mg total) by mouth as needed for fluid and edema 30 tablet 6  . losartan (COZAAR) 25 MG tablet Take 1 tablet (25 mg total) by mouth daily. 90 tablet 3  . oxyCODONE (ROXICODONE) 15 MG immediate release tablet Take 30 mg 2 (two) times daily by mouth.     . potassium chloride SA (K-DUR,KLOR-CON) 20 MEQ tablet Take 1 tablet (20 mEq total) by mouth on days when Furosemide is taken. 30 tablet 6   No current facility-administered medications for this encounter.     Allergies  Allergen Reactions  . Amiodarone     Headache and facial swelling  . Bee Venom Anaphylaxis  . Mexiletine   . Cortisone Swelling  . Robaxin [Methocarbamol] Hives and Itching      Social History   Socioeconomic History  . Marital status: Single    Spouse name: Not on file  . Number of children: 3  . Years of education: Not on file  . Highest  education level: Not on file  Social Needs  . Financial resource strain: Not on file  . Food insecurity - worry: Not on file  . Food insecurity - inability: Not on file  . Transportation needs - medical: Not on file  . Transportation needs - non-medical: Not on file  Occupational History  . Occupation: disabled    Fish farm manager: UNEMPLOYED  Tobacco Use  . Smoking status: Current Every Day Smoker    Packs/day: 0.50    Years: 15.00  Pack years: 7.50    Types: Cigarettes  . Smokeless tobacco: Never Used  Substance and Sexual Activity  . Alcohol use: Yes    Alcohol/week: 0.0 oz    Comment: occasionaly  . Drug use: No  . Sexual activity: Not Currently    Birth control/protection: Surgical  Other Topics Concern  . Not on file  Social History Narrative  . Not on file      Family History  Problem Relation Age of Onset  . Hepatitis Mother 51       HCV  . Liver cancer Mother 24  . Cancer Mother   . Diabetes Mother   . Heart disease Mother   . Hyperlipidemia Mother   . Stroke Sister 45       x3  . Diabetes Sister   . Hyperlipidemia Sister   . Dementia Father   . HIV Sister   . Diabetes Brother   . Heart disease Brother   . Hyperlipidemia Brother     Vitals:   07/24/17 1027  BP: 112/70  Pulse: 78  SpO2: 96%  Weight: 207 lb 6 oz (94.1 kg)    PHYSICAL EXAM: General:  Sitting on exam table. No respiratory difficulty HEENT: normal Neck: supple. JVP 7 Carotids 2+ bilat; no bruits. No lymphadenopathy or thryomegaly appreciated. Cor: PMI laterally displaced. Regular rate & rhythm. 2/6 SEM at LSB  Lungs: clear Abdomen: obese soft, nontender, nondistended. No hepatosplenomegaly. No bruits or masses. Good bowel sounds. Extremities: no cyanosis, clubbing, rash, trace edema Neuro: alert & oriented x 3, cranial nerves grossly intact. moves all 4 extremities w/o difficulty. Affect pleasant.  ECG: NSR 76 LVH repol QRS 146ms. Personally reviewed  ICD interrogated personally in  clinic. No VT/AF. Optivol over threshold  Activity level down from 3 hr/day to 2hr/day  ASSESSMENT & PLAN:  1. Acute on chronic systolic HF - She has long-standing mixed ischemic and NICM. EF 20-25% by echo 8/18. 13% by Myoview 11/18. S/p MDT ICD - Currently volume overloaded with NYHA IIIB symptoms - Continue carvedilol and losartan. Will consider transition to [facility] in near future - Increase lasix to 40 daily (taking only PRN currently) and add spiro 12.5 mg - Long discussion about possible need for advanced therapies and need for further risk stratification with CPX or cath depending on how much she improves after diuresis - Discussed absolute need to stop smoking to make her eligible for transplant process  2. CAD - s/p remote RCA stent - last cath 2004  - Myoview from 11/18 reviewed personally. EF 13% no obvious ischemia - Will likely need R/L cath soon.  - Continue ASA, statin  3. HTN - Blood pressure well controlled. Continue current regimen.  4. Tobacco use - As above stressed need to quit completely  5. VT - Managed by Dr. Caryl Comes. S/p MDT ICD  6. SLE - Followed by Rheumatologist at [facility]. Has been quiescent for many years.   Total time spent 45 minutes. Over half that time spent discussing above.   Glori Bickers, MD  8:49 PM

## 2017-07-28 ENCOUNTER — Telehealth (HOSPITAL_COMMUNITY): Payer: Self-pay

## 2017-07-28 NOTE — Telephone Encounter (Signed)
CHF Clinic appointment reminder call placed to patient for upcoming follow up.  Does understand purpose of this appointment and where CHF Clinic is located? Yes  How is patient feeling? Well, no complaints  Does patient have all of their medications? Yes  Patient also reminded to take all medications as prescribed on the day of his/her appointment and to bring all medications to this appointment.  Advised to call our office for tardiness or cancellations/rescheduling needs.  Leory Plowman, Guinevere Ferrari

## 2017-07-29 ENCOUNTER — Ambulatory Visit (HOSPITAL_COMMUNITY)
Admission: RE | Admit: 2017-07-29 | Discharge: 2017-07-29 | Disposition: A | Discharge status: Home / Self Care | Payer: Medicare Other | Source: Ambulatory Visit | Attending: Internal Medicine | Admitting: Internal Medicine

## 2017-07-29 ENCOUNTER — Encounter (HOSPITAL_COMMUNITY): Payer: Self-pay

## 2017-07-29 VITALS — BP 96/60 | HR 86 | Wt 204.0 lb

## 2017-07-29 DIAGNOSIS — E785 Hyperlipidemia, unspecified: Secondary | ICD-10-CM | POA: Insufficient documentation

## 2017-07-29 DIAGNOSIS — Z720 Tobacco use: Secondary | ICD-10-CM

## 2017-07-29 DIAGNOSIS — Z79899 Other long term (current) drug therapy: Secondary | ICD-10-CM | POA: Insufficient documentation

## 2017-07-29 DIAGNOSIS — E119 Type 2 diabetes mellitus without complications: Secondary | ICD-10-CM | POA: Insufficient documentation

## 2017-07-29 DIAGNOSIS — I5023 Acute on chronic systolic (congestive) heart failure: Secondary | ICD-10-CM | POA: Diagnosis not present

## 2017-07-29 DIAGNOSIS — Z79891 Long term (current) use of opiate analgesic: Secondary | ICD-10-CM | POA: Diagnosis not present

## 2017-07-29 DIAGNOSIS — Z95810 Presence of automatic (implantable) cardiac defibrillator: Secondary | ICD-10-CM | POA: Diagnosis not present

## 2017-07-29 DIAGNOSIS — Z8249 Family history of ischemic heart disease and other diseases of the circulatory system: Secondary | ICD-10-CM | POA: Diagnosis not present

## 2017-07-29 DIAGNOSIS — I252 Old myocardial infarction: Secondary | ICD-10-CM | POA: Insufficient documentation

## 2017-07-29 DIAGNOSIS — Z7982 Long term (current) use of aspirin: Secondary | ICD-10-CM | POA: Diagnosis not present

## 2017-07-29 DIAGNOSIS — I110 Hypertensive heart disease with heart failure: Secondary | ICD-10-CM | POA: Insufficient documentation

## 2017-07-29 DIAGNOSIS — F329 Major depressive disorder, single episode, unspecified: Secondary | ICD-10-CM | POA: Insufficient documentation

## 2017-07-29 DIAGNOSIS — I2510 Atherosclerotic heart disease of native coronary artery without angina pectoris: Secondary | ICD-10-CM | POA: Diagnosis not present

## 2017-07-29 DIAGNOSIS — Z800 Family history of malignant neoplasm of digestive organs: Secondary | ICD-10-CM | POA: Diagnosis not present

## 2017-07-29 DIAGNOSIS — Z833 Family history of diabetes mellitus: Secondary | ICD-10-CM | POA: Diagnosis not present

## 2017-07-29 DIAGNOSIS — I4729 Other ventricular tachycardia: Secondary | ICD-10-CM

## 2017-07-29 DIAGNOSIS — I10 Essential (primary) hypertension: Secondary | ICD-10-CM | POA: Diagnosis not present

## 2017-07-29 DIAGNOSIS — I5022 Chronic systolic (congestive) heart failure: Secondary | ICD-10-CM | POA: Diagnosis not present

## 2017-07-29 DIAGNOSIS — I429 Cardiomyopathy, unspecified: Secondary | ICD-10-CM | POA: Insufficient documentation

## 2017-07-29 DIAGNOSIS — M329 Systemic lupus erythematosus, unspecified: Secondary | ICD-10-CM | POA: Diagnosis not present

## 2017-07-29 DIAGNOSIS — Z888 Allergy status to other drugs, medicaments and biological substances status: Secondary | ICD-10-CM | POA: Diagnosis not present

## 2017-07-29 DIAGNOSIS — F17210 Nicotine dependence, cigarettes, uncomplicated: Secondary | ICD-10-CM | POA: Insufficient documentation

## 2017-07-29 DIAGNOSIS — Z823 Family history of stroke: Secondary | ICD-10-CM | POA: Diagnosis not present

## 2017-07-29 DIAGNOSIS — G4700 Insomnia, unspecified: Secondary | ICD-10-CM | POA: Diagnosis not present

## 2017-07-29 DIAGNOSIS — K219 Gastro-esophageal reflux disease without esophagitis: Secondary | ICD-10-CM | POA: Insufficient documentation

## 2017-07-29 DIAGNOSIS — Z91030 Bee allergy status: Secondary | ICD-10-CM | POA: Diagnosis not present

## 2017-07-29 DIAGNOSIS — I472 Ventricular tachycardia: Secondary | ICD-10-CM

## 2017-07-29 LAB — BASIC METABOLIC PANEL
Anion gap: 6 (ref 5–15)
BUN: 7 mg/dL (ref 6–20)
CHLORIDE: 102 mmol/L (ref 101–111)
CO2: 30 mmol/L (ref 22–32)
CREATININE: 0.87 mg/dL (ref 0.44–1.00)
Calcium: 9 mg/dL (ref 8.9–10.3)
GFR calc Af Amer: >60 mL/min (ref >60)
GLUCOSE: 93 mg/dL (ref 65–99)
POTASSIUM: 3.1 mmol/L — AB (ref 3.5–5.1)
SODIUM: 138 mmol/L (ref 135–145)

## 2017-07-29 NOTE — Progress Notes (Signed)
Advanced Heart Failure Clinic Note   Referring Physician: Dr, Caryl Comes Primary Care: Tula Nakayama, MD Primary Cardiologist: Dr. Caryl Comes  HPI:  [name] is a 51 y.o. female with h/o CAD s/p RCA stenting in 2458, severe systolic HF due to mixed cardiomyopathy EF , recurrent VT s/p MDT ICD, SLE, GERD and ongoing tobacco use referred by Dr. Caryl Comes for further evaluation and treatment of HF.   EF 8/18: EF 20-25%. RV normal. Dilated LV with moderate MR   Last cath 2004 EF 29% Left main has a distal 30% stenosis. Left anterior descending artery has an ostial 30% stenosis.    Left circumflex gives rise to a very large branching ramus intermedius, two  small marginal branches, and two small posterolateral branches.  The ramus  intermedius gives off two branches.  The more medial branch has a 95%  stenosis at its ostium.  However, this is a small caliber branch being less  than or equal to 1.5 mm in diameter.  The circumflex otherwise has minor  luminal irregularities, but no significant stenoses.  Right coronary artery has a 20% stenosis in the mid vessel.  Just distal to  this there is a stent in the distal portion of the mid vessel which is  widely patent with 0% stenosis within the stent.    CT scan chest: 11/18. No PE. Mild ground glass opacitites. No COPD mentioned  She presents today for follow up. Last week lasix increased from prn to 40 mg daily.  Feeling much better. Weight down 3 lbs by our scales. Does not weight at home. Orthopnea resolved. Gets around the house without difficulty. Denies lightheadedness or dizziness. She noticed increased UOP first 3-4 days on lasix and has since normalized. Denies CP. No SOB with ADLs such as changing clothes or bathing.   Myoview 06/30/17: EF 13% There is a large defect of severe severity present in the basal anteroseptal, basal inferoseptal, basal inferior, mid anteroseptal, mid inferoseptal, mid inferior and apical inferior  location.  Review of systems complete and found to be negative unless listed in HPI.    Past Medical History:  Diagnosis Date  . Arthritis of knee   . Automatic implantable cardiac defibrillator in situ    a. s/p prior Medtronic ICD with 6949 lead. b. s/p Gen change & lead revision 05/2013.  . Cardiomyopathy secondary    Patient has cardiomyopathy out of proportion to her ischemic heart disease  . coronary artery disease    RCA stenting Myoview 2011 EF 30% infarction dilatation without ischemia  . Depression   . Diabetes mellitus   . GERD (gastroesophageal reflux disease)   . Insomnia   . Lupus (systemic lupus erythematosus) ([place])   . Migraines   . Myocardial infarction ([place])    8 total   . Nicotine abuse   . Other and unspecified hyperlipidemia   . Paroxysmal VT ([place])   . Systolic CHF ([place])   . VF (ventricular fibrillation) ([place])    a. Hx appropriate ICD therapy for VF.    Current Outpatient Medications  Medication Sig Dispense Refill  . albuterol (PROVENTIL HFA;VENTOLIN HFA) 108 (90 Base) MCG/ACT inhaler Inhale 2 puffs into the lungs every 6 (six) hours as needed for wheezing or shortness of breath. 1 Inhaler 0  . ALPRAZolam (XANAX) 1 MG tablet One tablet at bedtime 30 tablet 3  . aspirin 81 MG tablet Take 1 tablet (81 mg total) by mouth daily. 30 tablet 6  . atorvastatin (LIPITOR) 10  MG tablet Take 1 tablet (10 mg total) by mouth daily. 90 tablet 2  . budesonide-formoterol (SYMBICORT) 80-4.5 MCG/ACT inhaler Inhale 2 puffs into the lungs 2 (two) times daily. 1 Inhaler 3  . carvedilol (COREG) 12.5 MG tablet Take 1 tablet (12.5 mg total) by mouth 2 (two) times daily. 60 tablet 6  . cyclobenzaprine (FLEXERIL) 5 MG tablet TAKE ONE TABLET BY MOUTH TWICE DAILY AS NEEDED FOR LEFT NECK SPASM AND ARM PAIN. 20 tablet 0  . fenofibrate (TRICOR) 145 MG tablet Take 145 mg by mouth daily.     . furosemide (LASIX) 20 MG tablet Take 2 tablets (40 mg total) by mouth daily. 30 tablet 6  .  losartan (COZAAR) 25 MG tablet Take 1 tablet (25 mg total) by mouth daily. 90 tablet 3  . oxyCODONE (ROXICODONE) 15 MG immediate release tablet Take 30 mg 2 (two) times daily by mouth.     . spironolactone (ALDACTONE) 25 MG tablet Take 0.5 tablets (12.5 mg total) by mouth daily. 45 tablet 3   No current facility-administered medications for this encounter.     Allergies  Allergen Reactions  . Amiodarone     Headache and facial swelling  . Bee Venom Anaphylaxis  . Mexiletine   . Cortisone Swelling  . Robaxin [Methocarbamol] Hives and Itching      Social History   Socioeconomic History  . Marital status: Single    Spouse name: Not on file  . Number of children: 3  . Years of education: Not on file  . Highest education level: Not on file  Social Needs  . Financial resource strain: Not on file  . Food insecurity - worry: Not on file  . Food insecurity - inability: Not on file  . Transportation needs - medical: Not on file  . Transportation needs - non-medical: Not on file  Occupational History  . Occupation: disabled    Fish farm manager: UNEMPLOYED  Tobacco Use  . Smoking status: Current Every Day Smoker    Packs/day: 0.50    Years: 15.00    Pack years: 7.50    Types: Cigarettes  . Smokeless tobacco: Never Used  Substance and Sexual Activity  . Alcohol use: Yes    Alcohol/week: 0.0 oz    Comment: occasionaly  . Drug use: No  . Sexual activity: Not Currently    Birth control/protection: Surgical  Other Topics Concern  . Not on file  Social History Narrative  . Not on file   Family History  Problem Relation Age of Onset  . Hepatitis Mother 3       HCV  . Liver cancer Mother 30  . Cancer Mother   . Diabetes Mother   . Heart disease Mother   . Hyperlipidemia Mother   . Stroke Sister 45       x3  . Diabetes Sister   . Hyperlipidemia Sister   . Dementia Father   . HIV Sister   . Diabetes Brother   . Heart disease Brother   . Hyperlipidemia Brother     Vitals:    07/29/17 1522  BP: 96/60  Pulse: 86  SpO2: 98%  Weight: 204 lb (92.5 kg)     Wt Readings from Last 3 Encounters:  07/29/17 204 lb (92.5 kg)  07/24/17 207 lb 6 oz (94.1 kg)  07/20/17 209 lb 9.6 oz (95.1 kg)     PHYSICAL EXAM: General: Well appearing. No resp difficulty. HEENT: Normal Neck: Supple. JVP 6-7 cm. Carotids 2+ bilat; no  bruits. No thyromegaly or nodule noted. Cor: PMI nondisplaced. RRR, 2/6 SEM at LSB. Lungs: CTAB, normal effort. Abdomen: Soft, non-tender, non-distended, no HSM. No bruits or masses. +BS  Extremities: No cyanosis, clubbing, or rash. R and LLE no edema.  Neuro: Alert & orientedx3, cranial nerves grossly intact. moves all 4 extremities w/o difficulty. Affect pleasant   ECG: NSR 76 LVH repol QRS 135ms at last visit.   ICD interrogated personally in clinic. No VT/AF. Optivol did NOT print with interrogation. Pt activity ~ 2-3 hrs daily.   ASSESSMENT & PLAN:  1. Acute on chronic systolic HF - She has long-standing mixed ischemic and NICM. EF 20-25% by echo 8/18. 13% by Myoview 11/18. S/p MDT ICD - NYHA II-III symptoms now - Cotninue lasix 40 mg daily.  - Continue carvedilol 12.5 mg BID - Continue losartan 25 mg daily. No room for Entresto today.  - Continue spiro 12.5 mg daily. BMET today. No room to up-titrate with soft pressures.  - Long discussion at last visit about possible need for advanced therapies and need for further risk stratification with CPX or cath depending on how much she improves after diuresis.   - Will plan CPX since she is feeling much better with diuresis.  - Discussed absolute need to stop smoking to make her eligible for transplant process  2. CAD - s/p remote RCA stent - No s/s of ischemia.    - last cath 2004  - Myoview from 11/18 reviewed personally. EF 13% no obvious ischemia - Will likely need R/L cath. Will plan after CPX.  - Continue ASA, statin  3. HTN - Stable to soft. Meds as above.   4. Tobacco use -  Encouraged complete cessation.   5. VT - Managed by Dr. Caryl Comes. S/p MDT ICD - No further on ICD interrogation.   6. SLE - Followed by Rheumatologist at [facility]. Has been quiescent for many years.   Meds and labs as above. Plan CPX. RTC 4-6 weeks. Sooner with symptoms.   Shirley Friar, PA-C  3:30 PM  Greater than 50% of the 25 minute visit was spent in counseling/coordination of care regarding disease state education, salt/fluid restriction, sliding scale diuretics, and medication compliance.

## 2017-07-29 NOTE — Patient Instructions (Signed)
Will schedule you for Cardiopulmonary Exercise Test. This test is done at our [facility]. Please wear comfortable clothes and shoes for this test. Avoid heavy meal before the test (light snack/meal recommended). Avoid caffeine, alcohol, tobacco products 12 hrs before test. Please give 24 hr notice for cancellations/rescheduling: [phone].  Follow up 4-6 weeks with Jonni Sanger.  Take all medication as prescribed the day of your appointment. Bring all medications with you to your appointment.  Do the following things EVERYDAY: 1) Weigh yourself in the morning before breakfast. Write it down and keep it in a log. 2) Take your medicines as prescribed 3) Eat low salt foods-Limit salt (sodium) to 2000 mg per day.  4) Stay as active as you can everyday 5) Limit all fluids for the day to less than 2 liters

## 2017-07-31 ENCOUNTER — Telehealth (HOSPITAL_COMMUNITY): Payer: Self-pay | Admitting: Cardiology

## 2017-07-31 MED ORDER — POTASSIUM CHLORIDE CRYS ER 20 MEQ PO TBCR: 20.0000 meq | EXTENDED_RELEASE_TABLET | Freq: Every day | ORAL | 3 refills | Status: DC

## 2017-07-31 NOTE — Telephone Encounter (Signed)
Patient aware. Patient voiced understanding rx sent

## 2017-07-31 NOTE — Telephone Encounter (Signed)
-----   Message from Shirley Friar, PA-C sent at Feb 06, 202018  7:23 AM EST ----- Please have her take 40 meq of potassium x 1, and then take 20 meq daily.   (New med for her)   Legrand Como "Oda Kilts, [place] Feb 06, 202018 7:23 AM

## 2017-08-04 ENCOUNTER — Other Ambulatory Visit: Payer: Self-pay | Admitting: Family Medicine

## 2017-08-04 ENCOUNTER — Ambulatory Visit (HOSPITAL_COMMUNITY): Payer: Medicare Other | Attending: Internal Medicine

## 2017-08-04 DIAGNOSIS — E669 Obesity, unspecified: Secondary | ICD-10-CM | POA: Insufficient documentation

## 2017-08-04 DIAGNOSIS — I5022 Chronic systolic (congestive) heart failure: Secondary | ICD-10-CM | POA: Insufficient documentation

## 2017-08-13 ENCOUNTER — Other Ambulatory Visit: Payer: Self-pay | Admitting: Internal Medicine

## 2017-08-20 ENCOUNTER — Ambulatory Visit (INDEPENDENT_AMBULATORY_CARE_PROVIDER_SITE_OTHER): Payer: Medicare Other

## 2017-08-20 DIAGNOSIS — T827XXA Infection and inflammatory reaction due to other cardiac and vascular devices, implants and grafts, initial encounter: Secondary | ICD-10-CM | POA: Diagnosis not present

## 2017-08-20 DIAGNOSIS — I5022 Chronic systolic (congestive) heart failure: Secondary | ICD-10-CM

## 2017-08-20 NOTE — Progress Notes (Signed)
EPIC Encounter for ICM Monitoring  Patient Name: [name] is a 52 y.o. female Date: 08/20/2017 Primary Care Physican: Fayrene Helper, MD Primary Cardiologist: Klein/Bensimhon Electrophysiologist: Faustino Congress Weight:Does not weigh       Heart Failure questions reviewed, pt asymptomatic.  Patient said her 1st visit with Dr Haroldine Laws did not go as expected because he talked about heart pumps and transplants.  She said she thought she was doing well and this conversation surprised her.  Advised to talk with PA at next appointment on 09/09/2016 to discuss her condition and the treatment so she has a better understanding.    Thoracic impedance normal.  Prescribed dosage: Furosemide 20 mg 2 tablets (40 mg total) daily.  Potassium 20 mEq 1 tablet (20 mEq total) by mouth daily.   Labs: 07/29/2018 Creatinine 0.87, BUN 7,   Potassium 3.1, Sodium 138, EGFR >60 07/07/2017 Creatinine 0.77, BUN 8,   Potassium 4.0, Sodium 139 06/30/2017 Creatinine 0.77, BUN 8,   Potassium 4.1, Sodium 139, EGFR >60  06/29/2017 Creatinine 0.72, BUN 8,   Potassium 4.1, Sodium 139, EGFR >60  03/23/2017 Creatinine 0.72, BUN 6,   Potassium 4.1, Sodium 138 03/04/2017 Creatinine 0.72, BUN 5,   Potassium 4.6, Sodium 140, EGFR 97-111  03/02/2017 Creatinine 0.68, BUN 7,   Potassium 4.3, Sodium 139, EGFR >89  02/27/2017 Creatinine 0.69, BUN 7, Potassium 2.9, Sodium 136, EGFR >60 (drawn in ER) 02/23/2017 Creatinine 0.72, BUN 8, Potassium 4.6, Sodium 141, EGFR >60 12/30/2016 Creatinine 0.70, BUN 4, Potassium 4.2, Sodium 140, EGFR 100-115 12/05/2016 Creatinine 0.72, BUN 7, Potassium 2.8, Sodium 138, EGFR >60  09/22/2016 Creatinine 0.80, BUN 10, Potassium 4.3, Sodium 138  Recommendations: No changes.   Encouraged to call for fluid symptoms.  Follow-up plan: ICM clinic phone appointment on 09/21/2017.  Office appointment scheduled1/23/2019 with HF clinic NP/PA.  Copy of ICM check sent to Dr. Caryl Comes.   3 month  ICM trend: 08/20/2017    1 Year ICM trend:       Rachel Billings, RN 08/20/2017 3:30 PM

## 2017-09-03 ENCOUNTER — Encounter: Payer: Self-pay | Admitting: Internal Medicine

## 2017-09-04 LAB — CUP PACEART INCLINIC DEVICE CHECK
Battery Remaining Longevity: 101 mo
Date Time Interrogation Session: 20181203190329
HIGH POWER IMPEDANCE MEASURED VALUE: 67 Ohm
Implantable Lead Implant Date: 20141031
Implantable Lead Location: 753860
Implantable Pulse Generator Implant Date: 20141031
Lead Channel Impedance Value: 361 Ohm
Lead Channel Pacing Threshold Amplitude: 0.75 V
Lead Channel Pacing Threshold Pulse Width: 0.4 ms
Lead Channel Setting Sensing Sensitivity: 0.3 mV
MDC IDC MSMT BATTERY VOLTAGE: 2.99 V
MDC IDC MSMT LEADCHNL RV IMPEDANCE VALUE: 418 Ohm
MDC IDC MSMT LEADCHNL RV SENSING INTR AMPL: 6.375 mV
MDC IDC SET LEADCHNL RV PACING AMPLITUDE: 2.5 V
MDC IDC SET LEADCHNL RV PACING PULSEWIDTH: 0.4 ms
MDC IDC STAT BRADY RV PERCENT PACED: 0.01 %

## 2017-09-07 ENCOUNTER — Ambulatory Visit: Payer: Medicare Other | Admitting: Cardiovascular Disease

## 2017-09-09 ENCOUNTER — Ambulatory Visit (HOSPITAL_COMMUNITY)
Admission: RE | Admit: 2017-09-09 | Discharge: 2017-09-09 | Disposition: A | Discharge status: Home / Self Care | Payer: Medicare Other | Source: Ambulatory Visit | Attending: Internal Medicine | Admitting: Internal Medicine

## 2017-09-09 ENCOUNTER — Encounter (HOSPITAL_COMMUNITY): Payer: Self-pay

## 2017-09-09 VITALS — BP 118/76 | HR 72 | Wt 210.0 lb

## 2017-09-09 DIAGNOSIS — I5022 Chronic systolic (congestive) heart failure: Secondary | ICD-10-CM | POA: Diagnosis not present

## 2017-09-09 DIAGNOSIS — I429 Cardiomyopathy, unspecified: Secondary | ICD-10-CM | POA: Insufficient documentation

## 2017-09-09 DIAGNOSIS — I110 Hypertensive heart disease with heart failure: Secondary | ICD-10-CM | POA: Diagnosis not present

## 2017-09-09 DIAGNOSIS — Z7982 Long term (current) use of aspirin: Secondary | ICD-10-CM | POA: Insufficient documentation

## 2017-09-09 DIAGNOSIS — M329 Systemic lupus erythematosus, unspecified: Secondary | ICD-10-CM | POA: Diagnosis not present

## 2017-09-09 DIAGNOSIS — I472 Ventricular tachycardia, unspecified: Secondary | ICD-10-CM

## 2017-09-09 DIAGNOSIS — F329 Major depressive disorder, single episode, unspecified: Secondary | ICD-10-CM | POA: Diagnosis not present

## 2017-09-09 DIAGNOSIS — Z4502 Encounter for adjustment and management of automatic implantable cardiac defibrillator: Secondary | ICD-10-CM | POA: Diagnosis not present

## 2017-09-09 DIAGNOSIS — I252 Old myocardial infarction: Secondary | ICD-10-CM | POA: Diagnosis not present

## 2017-09-09 DIAGNOSIS — K219 Gastro-esophageal reflux disease without esophagitis: Secondary | ICD-10-CM | POA: Insufficient documentation

## 2017-09-09 DIAGNOSIS — Z720 Tobacco use: Secondary | ICD-10-CM

## 2017-09-09 DIAGNOSIS — I10 Essential (primary) hypertension: Secondary | ICD-10-CM

## 2017-09-09 DIAGNOSIS — I2510 Atherosclerotic heart disease of native coronary artery without angina pectoris: Secondary | ICD-10-CM | POA: Insufficient documentation

## 2017-09-09 DIAGNOSIS — E119 Type 2 diabetes mellitus without complications: Secondary | ICD-10-CM | POA: Diagnosis not present

## 2017-09-09 DIAGNOSIS — E118 Type 2 diabetes mellitus with unspecified complications: Secondary | ICD-10-CM

## 2017-09-09 DIAGNOSIS — F17210 Nicotine dependence, cigarettes, uncomplicated: Secondary | ICD-10-CM | POA: Diagnosis not present

## 2017-09-09 DIAGNOSIS — I4729 Other ventricular tachycardia: Secondary | ICD-10-CM

## 2017-09-09 LAB — BASIC METABOLIC PANEL
ANION GAP: 10 (ref 5–15)
BUN: 7 mg/dL (ref 6–20)
CALCIUM: 8.9 mg/dL (ref 8.9–10.3)
CO2: 23 mmol/L (ref 22–32)
Chloride: 104 mmol/L (ref 101–111)
Creatinine, Ser: 0.74 mg/dL (ref 0.44–1.00)
GFR calc Af Amer: >60 mL/min (ref >60)
GFR calc non Af Amer: >60 mL/min (ref >60)
GLUCOSE: 110 mg/dL — AB (ref 65–99)
Potassium: 3.9 mmol/L (ref 3.5–5.1)
Sodium: 137 mmol/L (ref 135–145)

## 2017-09-09 NOTE — Patient Instructions (Signed)
NO changes at this time.  Routine lab work today. Will notify you of abnormal results, otherwise no news is good news!  Follow up 3 months with Dr. Haroldine Laws.  Take all medication as prescribed the day of your appointment. Bring all medications with you to your appointment.  Do the following things EVERYDAY: 1) Weigh yourself in the morning before breakfast. Write it down and keep it in a log. 2) Take your medicines as prescribed 3) Eat low salt foods-Limit salt (sodium) to 2000 mg per day.  4) Stay as active as you can everyday 5) Limit all fluids for the day to less than 2 liters

## 2017-09-09 NOTE — Progress Notes (Signed)
Advanced Heart Failure Clinic Note   Referring Physician: Dr, Caryl Comes Primary Care: Tula Nakayama, MD Primary Cardiologist: Dr. Caryl Comes  HPI:  [name] is a 52 y.o. female with h/o CAD s/p RCA stenting in 2952, severe systolic HF due to mixed cardiomyopathy EF , recurrent VT s/p MDT ICD, SLE, GERD and ongoing tobacco use referred by Dr. Caryl Comes for further evaluation and treatment of HF.   EF 8/18: EF 20-25%. RV normal. Dilated LV with moderate MR   Last cath 2004 EF 29% Left main has a distal 30% stenosis. Left anterior descending artery has an ostial 30% stenosis.    Left circumflex gives rise to a very large branching ramus intermedius, two  small marginal branches, and two small posterolateral branches.  The ramus  intermedius gives off two branches.  The more medial branch has a 95%  stenosis at its ostium.  However, this is a small caliber branch being less  than or equal to 1.5 mm in diameter.  The circumflex otherwise has minor  luminal irregularities, but no significant stenoses.  Right coronary artery has a 20% stenosis in the mid vessel.  Just distal to  this there is a stent in the distal portion of the mid vessel which is  widely patent with 0% stenosis within the stent.    CT scan chest: 11/18. No PE. Mild ground glass opacitites. No COPD mentioned  She presents today for regular follow up. CPX after last visit with at least moderate CV limitation. Her main complaint today is a head cold with subjective fever and chills x 1 week. Has not been on any ABX. Overall she is feeling OK. She is SOB walking short distances, but not with changing her clothes or bathing. She denies lightheadedness or dizziness. Taking all medications as directed.  Myoview 06/30/17: EF 13% There is a large defect of severe severity present in the basal anteroseptal, basal inferoseptal, basal inferior, mid anteroseptal, mid inferoseptal, mid inferior and apical inferior location.  CVP  08/04/17 Pre-Exercise PFTs  FVC 2.21 (71%)    FEV1 1.78 (72%)     FEV1/FVC 81 (100%)     MVV 69 (69%)   Exercise Time:  6:15  Speed (mph): 2.0    Grade (%): 7.0   RPE: 16 Reason stopped: patient ended test due to being lightheaded (9/10) Additional symptoms: dyspnea (7/10)  Resting HR: 79 Peak HR: 94  (54% age predicted max HR) BP rest: 86/62 BP peak: 114/70 Peak VO2: 10.0 (50% predicted peak VO2) VE/VCO2 slope: 46 OUES: 1.04 Peak RER: 0.91 Ventilatory Threshold: 9.4 (47% predicted or measured peak VO2) Peak RR 44 Peak Ventilation: 38.3 VE/MVV: 56% PETCO2 at peak: 26 O2pulse: 10  (91% predicted O2pulse)  Review of systems complete and found to be negative unless listed in HPI.    Past Medical History:  Diagnosis Date  . Arthritis of knee   . Automatic implantable cardiac defibrillator in situ    a. s/p prior Medtronic ICD with 6949 lead. b. s/p Gen change & lead revision 05/2013.  . Cardiomyopathy secondary    Patient has cardiomyopathy out of proportion to her ischemic heart disease  . coronary artery disease    RCA stenting Myoview 2011 EF 30% infarction dilatation without ischemia  . Depression   . Diabetes mellitus   . GERD (gastroesophageal reflux disease)   . Insomnia   . Lupus (systemic lupus erythematosus) ([place])   . Migraines   . Myocardial infarction ([place])    8  total   . Nicotine abuse   . Other and unspecified hyperlipidemia   . Paroxysmal VT ([place])   . Systolic CHF ([place])   . VF (ventricular fibrillation) ([place])    a. Hx appropriate ICD therapy for VF.   Current Outpatient Medications  Medication Sig Dispense Refill  . albuterol (PROVENTIL HFA;VENTOLIN HFA) 108 (90 Base) MCG/ACT inhaler Inhale 2 puffs into the lungs every 6 (six) hours as needed for wheezing or shortness of breath. 1 Inhaler 0  . ALPRAZolam (XANAX) 1 MG tablet One tablet at bedtime 30 tablet 3  . aspirin 81 MG tablet Take 1 tablet (81 mg total) by mouth daily. 30  tablet 6  . atorvastatin (LIPITOR) 10 MG tablet Take 1 tablet (10 mg total) by mouth daily. 90 tablet 2  . budesonide-formoterol (SYMBICORT) 80-4.5 MCG/ACT inhaler Inhale 2 puffs into the lungs 2 (two) times daily. 1 Inhaler 3  . cyclobenzaprine (FLEXERIL) 5 MG tablet TAKE ONE TABLET BY MOUTH TWICE DAILY AS NEEDED FOR LEFT NECK SPASM AND ARM PAIN. 20 tablet 0  . fenofibrate (TRICOR) 145 MG tablet Take 145 mg by mouth daily.     . furosemide (LASIX) 20 MG tablet Take 2 tablets (40 mg total) by mouth daily. 30 tablet 6  . losartan (COZAAR) 25 MG tablet Take 1 tablet (25 mg total) by mouth daily. 90 tablet 3  . oxyCODONE (ROXICODONE) 15 MG immediate release tablet Take 30 mg 2 (two) times daily by mouth.     . potassium chloride SA (K-DUR,KLOR-CON) 20 MEQ tablet Take 1 tablet (20 mEq total) by mouth daily. 90 tablet 3  . spironolactone (ALDACTONE) 25 MG tablet Take 0.5 tablets (12.5 mg total) by mouth daily. 45 tablet 3  . carvedilol (COREG) 12.5 MG tablet Take 1 tablet (12.5 mg total) by mouth 2 (two) times daily. 60 tablet 6   No current facility-administered medications for this encounter.    Allergies  Allergen Reactions  . Amiodarone     Headache and facial swelling  . Bee Venom Anaphylaxis  . Mexiletine   . Cortisone Swelling  . Robaxin [Methocarbamol] Hives and Itching   Social History   Socioeconomic History  . Marital status: Single    Spouse name: Not on file  . Number of children: 3  . Years of education: Not on file  . Highest education level: Not on file  Social Needs  . Financial resource strain: Not on file  . Food insecurity - worry: Not on file  . Food insecurity - inability: Not on file  . Transportation needs - medical: Not on file  . Transportation needs - non-medical: Not on file  Occupational History  . Occupation: disabled    Fish farm manager: UNEMPLOYED  Tobacco Use  . Smoking status: Current Every Day Smoker    Packs/day: 0.50    Years: 15.00    Pack years:  7.50    Types: Cigarettes  . Smokeless tobacco: Never Used  Substance and Sexual Activity  . Alcohol use: Yes    Alcohol/week: 0.0 oz    Comment: occasionaly  . Drug use: No  . Sexual activity: Not Currently    Birth control/protection: Surgical  Other Topics Concern  . Not on file  Social History Narrative  . Not on file   Family History  Problem Relation Age of Onset  . Hepatitis Mother 73       HCV  . Liver cancer Mother 47  . Cancer Mother   . Diabetes Mother   .  Heart disease Mother   . Hyperlipidemia Mother   . Stroke Sister 45       x3  . Diabetes Sister   . Hyperlipidemia Sister   . Dementia Father   . HIV Sister   . Diabetes Brother   . Heart disease Brother   . Hyperlipidemia Brother    Vitals:   09/09/17 1521  BP: 118/76  Pulse: 72  SpO2: 98%  Weight: 210 lb (95.3 kg)    Wt Readings from Last 3 Encounters:  09/09/17 210 lb (95.3 kg)  07/29/17 204 lb (92.5 kg)  07/24/17 207 lb 6 oz (94.1 kg)    PHYSICAL EXAM: General: Fatigued appearing. Congested. HEENT: Normal Neck: Supple. JVP 6-7 cm. Carotids 2+ bilat; no bruits. No thyromegaly or nodule noted. Cor: PMI nondisplaced. RRR, 2/6 SEM at LSB Lungs: Diminished basilar sounds. Abdomen: Soft, non-tender, non-distended, no HSM. No bruits or masses. +BS  Extremities: No cyanosis, clubbing, or rash. Trace ankle edema. Neuro: Alert & orientedx3, cranial nerves grossly intact. moves all 4 extremities w/o difficulty. Affect flat but appropriate.   ICD interrogated personally in clinic. No VT/VF. Fluid index low to dry. Thoracic impedence above threshold. Pt activity ~ 2 hrs daily.  ASSESSMENT & PLAN:  1. Chronic systolic HF - She has long-standing mixed ischemic and NICM. EF 20-25% by echo 8/18. 13% by Myoview 11/18. S/p MDT ICD - NYHA III symptoms, slightly worse with her head cold.  - Cotninue lasix 40 mg daily.  - Continue carvedilol 12.5 mg BID - Continue losartan 25 mg daily. No room for  Entresto today.  - Continue spiro 12.5 mg daily. May be able to increase if K remains low (has been hesitant to up-titrate meds with soft pressures) - CPX with at least moderate limitation from cardiac perspective.  - Long discussion at previous visit about possible need for advanced therapies and need for further risk stratification with CPX or cath depending on how much she improves after diuresis.  Relatively stable currently. Will proceed to [facility] if functional status worsens. Pt hesitant to consider transplant process or advanced therapies. - Discussed absolute need to stop smoking to make her eligible for transplant process  2. CAD - s/p remote RCA stent. - No s/s of ischemia. - last cath 2004. - Myoview from 11/18 reviewed personally. EF 13% no obvious ischemia - Continue ASA and statin.  3. HTN - Soft. No med titration today. Optivol and exam on dry side with decreased appetite 2/2 URI.   4. Tobacco use - Stopped smoking before christmas. Congratulated on cessation.   5. VT - Managed by Dr. Caryl Comes. S/p MDT ICD - No further on ICD interrogation. No change.   6. SLE - Followed by Rheumatologist at [facility]. Has been quiescent for many years. No change.   Pt has fairly advanced HF but has been resistant to discussions of advanced therapies.  Her functional status is relatively stable at this point, but would proceed to [place] if worsens for advanced therapy consideration. Continue current meds. Labs today. RTC 3 months. Sooner with symptoms. Knows to call if symptoms worsen.   Shirley Friar, PA-C  3:37 PM   Patient seen and examined with the above-signed Advanced Practice Provider and/or Housestaff. I personally reviewed laboratory data, imaging studies and relevant notes. I independently examined the patient and formulated the important aspects of the plan. I have edited the note to reflect any of my changes or salient points. I have personally discussed the plan with  the  patient and/or family.  CPX testing reviewed with her in detail. Submax study but suggestive of which advanced HF with markedly elevated VE/VCO2 slope. However, she remains adamant that she feels fine and is not symptomatic at this point. I stressed the need to watch closely for worsening symptoms and to let us know if this occurs. She stated that she will call Dr. Caryl Comes first and then let us know. We discussed possibility of RHC but she is not interested at this time. We will continue to follow closely. BP too soft to titrate meds today.   On exam volume status looks good. No s3.  Total time spent 45 minutes. Over half that time spent discussing above.   Glori Bickers, MD  6:07 PM

## 2017-09-21 ENCOUNTER — Ambulatory Visit (INDEPENDENT_AMBULATORY_CARE_PROVIDER_SITE_OTHER): Payer: Medicare Other | Admitting: *Deleted

## 2017-09-21 ENCOUNTER — Telehealth: Payer: Self-pay

## 2017-09-21 DIAGNOSIS — I472 Ventricular tachycardia, unspecified: Secondary | ICD-10-CM

## 2017-09-21 DIAGNOSIS — T827XXA Infection and inflammatory reaction due to other cardiac and vascular devices, implants and grafts, initial encounter: Secondary | ICD-10-CM | POA: Diagnosis not present

## 2017-09-21 DIAGNOSIS — I5022 Chronic systolic (congestive) heart failure: Secondary | ICD-10-CM

## 2017-09-21 NOTE — Telephone Encounter (Signed)
Remote ICM transmission received.  Attempted call to patient and left message per DPR regarding transmission and requested a call back

## 2017-09-21 NOTE — Progress Notes (Signed)
Remote ICD transmission.   

## 2017-09-21 NOTE — Progress Notes (Signed)
EPIC Encounter for ICM Monitoring  Patient Name: [name] is a 52 y.o. female Date: 09/21/2017 Primary Care Physican: Fayrene Helper, MD Primary Cardiologist: Klein/Bensimhon Electrophysiologist: Faustino Congress Weight:Does not weigh         Attempted call to patient and unable to reach.  Left message to return call regarding transmission.  Transmission reviewed.    Thoracic impedance abnormal suggesting fluid accumulation since 09/15/2017.  Prescribed dosage: Furosemide 20 mg 2 tablets (40 mg total) daily.  Potassium 20 mEq 1 tablet (20 mEq total) by mouth daily.   Labs: 07/29/2018 Creatinine 0.87, BUN 7,   Potassium 3.1, Sodium 138, EGFR >60 07/07/2017 Creatinine0.77, BUN8,   Potassium4.0, [id number] 06/30/2017 Creatinine0.77, BUN8,   Potassium4.1, Sodium139, EGFR>60  06/29/2017 Creatinine0.72, BUN8,   Potassium4.1, Sodium139, EGFR>60  03/23/2017 Creatinine0.72, BUN6,   Potassium4.1, Sodium138 03/04/2017 Creatinine0.72, BUN5,   Potassium4.6, Sodium140, [id number]  03/02/2017 Creatinine0.68, BUN7,   Potassium4.3, [id number], EGFR>89 02/27/2017 Creatinine 0.69, BUN 7, Potassium 2.9, Sodium 136, EGFR >60 (drawn in ER) 02/23/2017 Creatinine 0.72, BUN 8, Potassium 4.6, Sodium 141, EGFR >60 12/30/2016 Creatinine 0.70, BUN 4, Potassium 4.2, Sodium 140, EGFR 100-115 12/05/2016 Creatinine 0.72, BUN 7, Potassium 2.8, Sodium 138, EGFR >60  09/22/2016 Creatinine 0.80, BUN 10, Potassium 4.3, Sodium 138  Recommendations: NONE - Unable to reach.  Follow-up plan: ICM clinic phone appointment on 10/01/2017.    Copy of ICM check sent to Dr. Caryl Comes and Dr. Haroldine Laws.   3 month ICM trend: 09/21/2017    1 Year ICM trend:       [name], RN 09/21/2017 2:24 PM

## 2017-09-22 ENCOUNTER — Encounter: Payer: Self-pay | Admitting: Cardiology

## 2017-09-22 DIAGNOSIS — H40003 Preglaucoma, unspecified, bilateral: Secondary | ICD-10-CM | POA: Diagnosis not present

## 2017-09-22 DIAGNOSIS — H0288A Meibomian gland dysfunction right eye, upper and lower eyelids: Secondary | ICD-10-CM | POA: Diagnosis not present

## 2017-09-22 DIAGNOSIS — H2513 Age-related nuclear cataract, bilateral: Secondary | ICD-10-CM | POA: Diagnosis not present

## 2017-09-22 DIAGNOSIS — Z79899 Other long term (current) drug therapy: Secondary | ICD-10-CM | POA: Diagnosis not present

## 2017-09-22 DIAGNOSIS — H0288B Meibomian gland dysfunction left eye, upper and lower eyelids: Secondary | ICD-10-CM | POA: Diagnosis not present

## 2017-09-22 DIAGNOSIS — M329 Systemic lupus erythematosus, unspecified: Secondary | ICD-10-CM | POA: Diagnosis not present

## 2017-09-22 DIAGNOSIS — H5213 Myopia, bilateral: Secondary | ICD-10-CM | POA: Diagnosis not present

## 2017-09-25 ENCOUNTER — Other Ambulatory Visit: Payer: Self-pay | Admitting: Internal Medicine

## 2017-10-01 ENCOUNTER — Ambulatory Visit (INDEPENDENT_AMBULATORY_CARE_PROVIDER_SITE_OTHER): Payer: Self-pay

## 2017-10-01 DIAGNOSIS — T827XXA Infection and inflammatory reaction due to other cardiac and vascular devices, implants and grafts, initial encounter: Secondary | ICD-10-CM

## 2017-10-01 DIAGNOSIS — I5022 Chronic systolic (congestive) heart failure: Secondary | ICD-10-CM

## 2017-10-01 NOTE — Progress Notes (Signed)
EPIC Encounter for ICM Monitoring  Patient Name: [name] is a 52 y.o. female Date: 10/01/2017 Primary Care Physican: Fayrene Helper, MD Primary Cardiologist: Klein/Bensimhon Electrophysiologist: Faustino Congress Weight:Does not weigh      Heart Failure questions reviewed, pt asymptomatic.   Thoracic impedance returned to normal since last transmission 09/21/2017.  Prescribed dosage: Furosemide 20 mg 2 tablets (40 mg total)daily.Potassium 20 mEq 1 tablet (20 mEq total) by mouth daily.  Labs: 07/29/2018 Creatinine 0.87, BUN 7, Potassium 3.1, Sodium 138, EGFR >60 07/07/2017 Creatinine0.77, BUN8, Potassium4.0, Sodium139 06/30/2017 Creatinine0.77, BUN8, Potassium4.1, Sodium139, EGFR>60  06/29/2017 Creatinine0.72, BUN8, Potassium4.1, Sodium139, EGFR>60  03/23/2017 Creatinine0.72, BUN6, Potassium4.1, Sodium138 03/04/2017 Creatinine0.72, BUN5, Potassium4.6, Sodium140, [id number]  03/02/2017 Creatinine0.68, BUN7, Potassium4.3, [id number], EGFR>89 02/27/2017 Creatinine 0.69, BUN 7, Potassium 2.9, Sodium 136, EGFR >60 (drawn in ER) 02/23/2017 Creatinine 0.72, BUN 8, Potassium 4.6, Sodium 141, EGFR >60 12/30/2016 Creatinine 0.70, BUN 4, Potassium 4.2, Sodium 140, EGFR 100-115 12/05/2016 Creatinine 0.72, BUN 7, Potassium 2.8, Sodium 138, EGFR >60  09/22/2016 Creatinine 0.80, BUN 10, Potassium 4.3, Sodium 138  Recommendations: No changes.  Advised to limit salt intake to 2000 mg/day.  Encouraged to call for fluid symptoms.  Follow-up plan: ICM clinic phone appointment on 10/22/2017.    Copy of ICM check sent to Dr. Caryl Comes.   3 month ICM trend: 10/01/2017    1 Year ICM trend:       [name], RN 10/01/2017 9:55 AM

## 2017-10-06 ENCOUNTER — Encounter: Payer: Self-pay | Admitting: Internal Medicine

## 2017-10-13 LAB — CUP PACEART REMOTE DEVICE CHECK
Battery Remaining Longevity: 99 mo
Battery Voltage: 2.99 V
Brady Statistic RV Percent Paced: 0.01 %
Date Time Interrogation Session: 20190204143430
HIGH POWER IMPEDANCE MEASURED VALUE: 80 Ohm
Implantable Pulse Generator Implant Date: 20141031
Lead Channel Impedance Value: 399 Ohm
Lead Channel Impedance Value: 475 Ohm
Lead Channel Pacing Threshold Pulse Width: 0.4 ms
Lead Channel Sensing Intrinsic Amplitude: 8.5 mV
Lead Channel Setting Pacing Amplitude: 2.5 V
Lead Channel Setting Pacing Pulse Width: 0.4 ms
MDC IDC LEAD IMPLANT DT: 20141031
MDC IDC LEAD LOCATION: 753860
MDC IDC MSMT LEADCHNL RV PACING THRESHOLD AMPLITUDE: 0.625 V
MDC IDC MSMT LEADCHNL RV SENSING INTR AMPL: 8.5 mV
MDC IDC SET LEADCHNL RV SENSING SENSITIVITY: 0.3 mV

## 2017-10-22 ENCOUNTER — Telehealth: Payer: Self-pay

## 2017-10-22 ENCOUNTER — Ambulatory Visit (INDEPENDENT_AMBULATORY_CARE_PROVIDER_SITE_OTHER): Payer: Medicare Other

## 2017-10-22 DIAGNOSIS — T827XXA Infection and inflammatory reaction due to other cardiac and vascular devices, implants and grafts, initial encounter: Secondary | ICD-10-CM

## 2017-10-22 DIAGNOSIS — I5022 Chronic systolic (congestive) heart failure: Secondary | ICD-10-CM

## 2017-10-22 NOTE — Progress Notes (Signed)
EPIC Encounter for ICM Monitoring  Patient Name: [name] is a 52 y.o. female Date: 10/22/2017 Primary Care Physican: Fayrene Helper, MD Primary Cardiologist: Klein/Bensimhon Electrophysiologist: Faustino Congress Weight:Does not weigh  Since 01-Oct-2017: VT-NS (>4 beats, >150 bpm)      21       Attempted call to patient and unable to reach.  Left detailed message regarding transmission.  Transmission reviewed.    Thoracic impedance abnormal suggesting fluid accumulation since 10/18/2017.  Prescribed dosage: Furosemide 20 mg 2 tablets (40 mg total)daily.Potassium 20 mEq 1 tablet (20 mEq total) by mouth daily.  Labs: 09/09/2017 Creatinine 0.74, BUN 7,   Potassium 3.9, Sodium 137, EGFR >60 07/29/2017 Creatinine 0.87, BUN 7, Potassium 3.1, Sodium 138, EGFR >60 07/07/2017 Creatinine0.77, BUN8, Potassium4.0, Sodium139 06/30/2017 Creatinine0.77, BUN8, Potassium4.1, Sodium139, EGFR>60  06/29/2017 Creatinine0.72, BUN8, Potassium4.1, Sodium139, EGFR>60  03/23/2017 Creatinine0.72, BUN6, Potassium4.1, Sodium138 03/04/2017 Creatinine0.72, BUN5, Potassium4.6, Sodium140, [id number]  03/02/2017 Creatinine0.68, BUN7, Potassium4.3, Sodium139, EGFR>89 02/27/2017 Creatinine 0.69, BUN 7, Potassium 2.9, Sodium 136, EGFR >60 (drawn in ER) 02/23/2017 Creatinine 0.72, BUN 8, Potassium 4.6, Sodium 141, EGFR >60 12/30/2016 Creatinine 0.70, BUN 4, Potassium 4.2, Sodium 140, EGFR 100-115 12/05/2016 Creatinine 0.72, BUN 7, Potassium 2.8, Sodium 138, EGFR >60  09/22/2016 Creatinine 0.80, BUN 10, Potassium 4.3, Sodium 138  Recommendations: Left voice mail with ICM number and encouraged to call if experiencing any fluid symptoms.  Follow-up plan: ICM clinic phone appointment on 11/09/2017 to recheck fluid levels.  Office appointment scheduled 12/09/2017 with Dr. Caryl Comes and Dr Haroldine Laws.   Copy of ICM check sent to Dr. Caryl Comes and Dr. Haroldine Laws.   3  month ICM trend: 10/22/2017   Nonsustained VT    1 Year ICM trend:       [name], RN 10/22/2017 11:39 AM

## 2017-10-22 NOTE — Telephone Encounter (Signed)
Remote ICM transmission received.  Attempted call to patient and left detailed message per DPR to return call for any fluid symptoms.

## 2017-11-04 ENCOUNTER — Ambulatory Visit (INDEPENDENT_AMBULATORY_CARE_PROVIDER_SITE_OTHER): Payer: Medicare Other | Admitting: Family Medicine

## 2017-11-04 ENCOUNTER — Encounter: Payer: Self-pay | Admitting: Family Medicine

## 2017-11-04 ENCOUNTER — Other Ambulatory Visit: Payer: Self-pay

## 2017-11-04 VITALS — BP 120/74 | HR 64 | Temp 98.0°F | Resp 16 | Ht 66.0 in | Wt 210.0 lb

## 2017-11-04 DIAGNOSIS — I10 Essential (primary) hypertension: Secondary | ICD-10-CM

## 2017-11-04 DIAGNOSIS — Z720 Tobacco use: Secondary | ICD-10-CM | POA: Diagnosis not present

## 2017-11-04 DIAGNOSIS — F5101 Primary insomnia: Secondary | ICD-10-CM | POA: Diagnosis not present

## 2017-11-04 DIAGNOSIS — B351 Tinea unguium: Secondary | ICD-10-CM | POA: Diagnosis not present

## 2017-11-04 DIAGNOSIS — F17210 Nicotine dependence, cigarettes, uncomplicated: Secondary | ICD-10-CM | POA: Diagnosis not present

## 2017-11-04 DIAGNOSIS — J3089 Other allergic rhinitis: Secondary | ICD-10-CM

## 2017-11-04 DIAGNOSIS — Z1231 Encounter for screening mammogram for malignant neoplasm of breast: Secondary | ICD-10-CM | POA: Diagnosis not present

## 2017-11-04 DIAGNOSIS — E785 Hyperlipidemia, unspecified: Secondary | ICD-10-CM | POA: Diagnosis not present

## 2017-11-04 DIAGNOSIS — E118 Type 2 diabetes mellitus with unspecified complications: Secondary | ICD-10-CM

## 2017-11-04 MED ORDER — PREDNISONE 5 MG PO TABS: ORAL_TABLET | ORAL | 0 refills | Status: DC

## 2017-11-04 MED ORDER — ALPRAZOLAM 1 MG PO TABS: ORAL_TABLET | ORAL | 3 refills | Status: DC

## 2017-11-04 MED ORDER — CYCLOBENZAPRINE HCL 5 MG PO TABS: 5.0000 mg | ORAL_TABLET | Freq: Every day | ORAL | 0 refills | Status: DC

## 2017-11-04 MED ORDER — MONTELUKAST SODIUM 10 MG PO TABS: 10.0000 mg | ORAL_TABLET | Freq: Every day | ORAL | 3 refills | Status: DC

## 2017-11-04 MED ORDER — TERBINAFINE HCL 250 MG PO TABS: 250.0000 mg | ORAL_TABLET | Freq: Every day | ORAL | 1 refills | Status: DC

## 2017-11-04 NOTE — Assessment & Plan Note (Signed)
Sleep hygiene reviewed and written information offered also. Prescription sent for  medication needed.  

## 2017-11-04 NOTE — Assessment & Plan Note (Signed)
Hyperlipidemia:Low fat diet discussed and encouraged.   Lipid Panel  Lab Results  Component Value Date   CHOL 106 06/29/2017   HDL 28 (L) 06/29/2017   LDLCALC 63 06/29/2017   TRIG 75 06/29/2017   CHOLHDL 3.8 06/29/2017     Updated lab needed at/ before next visit.

## 2017-11-04 NOTE — Assessment & Plan Note (Signed)
Uncontrolled, start daily singulair  And short course of prednisone x 5 days , offered nasal spray , pt not interested

## 2017-11-04 NOTE — Assessment & Plan Note (Signed)
Deteriorated. Patient re-educated about  the importance of commitment to a  minimum of 150 minutes of exercise per week.  The importance of healthy food choices with portion control discussed. Encouraged to start a food diary, count calories and to consider  joining a support group. Sample diet sheets offered. Goals set by the patient for the next several months.   Weight /BMI 11/04/2017 09/09/2017 07/29/2017  WEIGHT 210 lb 210 lb 204 lb  HEIGHT 5\' 6"  - -  BMI 33.89 kg/m2 33.89 kg/m2 32.93 kg/m2

## 2017-11-04 NOTE — Assessment & Plan Note (Signed)
Continue bedtime xanax for sleep and anxiety

## 2017-11-04 NOTE — Patient Instructions (Addendum)
F/u in 4 months, call if you need me before  Mammogram to be scheduled pls, past due, pt goes to [facility]  COPNGRATS cigarettes down to 2/day, QUIT next!  Fasting lipid, cmp and EGFr, HBA1C, TSH  End July  Wellness, visit with nurse in 4 to 8 weeks,   Medications are sent in as discussed, allergies, singulair, preednisone, terbinafine, xanax  Pls start OTC vitamin D daily

## 2017-11-04 NOTE — Progress Notes (Signed)
[name]     MRN: [id number]      DOB: [birth year]   HPI [name] is here for follow up and re-evaluation of chronic medical conditions, medication management and review of any available recent lab and radiology data.  Preventive health is updated, specifically  Cancer screening and Immunization. Needs mammogram scheduled Questions or concerns regarding consultations or procedures which the PT has had in the interim are  addressed. The PT denies any adverse reactions to current medications since the last visit.  2 day h/o increased and uncontrolled allergy symptoms with head congestion and nasal stuffiness, denies fever , chills and drainage is clear Down to 2 cigarettes / day, plans to quit but wil not set date.  ROS  Denies s ear pain or sore throat. Denies chest congestion, productive cough or wheezing. Denies chest pains, palpitations and leg swelling Denies abdominal pain, nausea, vomiting,diarrhea or constipation.   Denies dysuria, frequency, hesitancy or incontinence. Denies joint pain, swelling and limitation in mobility. Denies headaches, seizures, numbness, or tingling. Denies depression, anxiety  Denies skin break down or rash.   PE  BP 120/74 (BP Location: Left Arm, Patient Position: Sitting, Cuff Size: Normal)   Pulse 64   Temp 98 F (36.7 C) (Temporal)   Resp 16   Ht 5\' 6"  (1.676 m)   Wt 210 lb (95.3 kg)   SpO2 98%   BMI 33.89 kg/m   Patient alert and oriented and in no cardiopulmonary distress.  HEENT: No facial asymmetry, EOMI,   oropharynx pink and moist.  Neck supple no JVD, no mass.Maxillary sinus tenderness  Chest: Clear to auscultation bilaterally.  CVS: S1, S2 no murmurs, no S3.Regular rate.  ABD: Soft non tender.   Ext: No edema  MS: Adequate ROM spine, shoulders, hips and knees.  Skin: Intact, no ulcerations or rash noted.  Psych: Good eye contact, normal affect. Memory intact not anxious or depressed appearing.  CNS: CN 2-12  intact, power,  normal throughout.no focal deficits noted.   Assessment & Plan  ANXIETY Continue bedtime xanax for sleep and anxiety  HTN, goal below 130/80 Controlled, no change in medication DASH diet and commitment to daily physical activity for a minimum of 30 minutes discussed and encouraged, as a part of hypertension management. The importance of attaining a healthy weight is also discussed.  BP/Weight 11/04/2017 09/09/2017 07/29/2017 07/24/2017 07/20/2017 07/07/2017 05/10/3006  Systolic BP 622 633 96 354 562 563 893  Diastolic BP 74 76 60 70 76 74 76  Wt. (Lbs) 210 210 204 207.38 209.6 208 -  BMI 33.89 33.89 32.93 33.47 33.83 33.57 -       Hyperlipidemia LDL goal <100 Hyperlipidemia:Low fat diet discussed and encouraged.   Lipid Panel  Lab Results  Component Value Date   CHOL 106 06/29/2017   HDL 28 (L) 06/29/2017   LDLCALC 63 06/29/2017   TRIG 75 06/29/2017   CHOLHDL 3.8 06/29/2017     Updated lab needed at/ before next visit.   Insomnia Sleep hygiene reviewed and written information offered also. Prescription sent for  medication needed.   Nicotine abuse Asked : confirms 2 cigarettes daily, much improved,  Assess: wants to quit, will not commit to date Advise: ned to quit  Due to established hear and lung disease. Also to reduce cancer risk Arrange f/u in next months, info re [place] and smoking cessation classes offered, no interest states she has to "do it on her own" Time spent :  5 mins   Morbid obesity Deteriorated. Patient re-educated about  the importance of commitment to a  minimum of 150 minutes of exercise per week.  The importance of healthy food choices with portion control discussed. Encouraged to start a food diary, count calories and to consider  joining a support group. Sample diet sheets offered. Goals set by the patient for the next several months.   Weight /BMI 11/04/2017 09/09/2017 07/29/2017  WEIGHT 210 lb 210 lb 204 lb    HEIGHT 5\' 6"  - -  BMI 33.89 kg/m2 33.89 kg/m2 32.93 kg/m2      Allergic rhinitis Uncontrolled, start daily singulair  And short course of prednisone x 5 days , offered nasal spray , pt not interested  Diabetes mellitus type 2 with complications [facility]) [name] is reminded of the importance of commitment to daily physical activity for 30 minutes or more, as able and the need to limit carbohydrate intake to 30 to 60 grams per meal to help with blood sugar control.   [name] is reminded of the importance of daily foot exam, annual eye examination, and good blood sugar, blood pressure and cholesterol control. Updated lab needed at/ before next visit.   Diabetic Labs Latest Ref Rng & Units 09/09/2017 07/29/2017 07/07/2017 06/30/2017 06/29/2017  HbA1c 4.8 - 5.6 % - - - - 6.1(H)  Microalbumin Not estab mg/dL - - - - -  Micro/Creat Ratio <30 mcg/mg creat - - - - -  Chol 0 - 200 mg/dL - - - - 106  HDL >40 mg/dL - - - - 28(L)  Calc LDL 0 - 99 mg/dL - - - - 63  Triglycerides <150 mg/dL - - - - 75  Creatinine 0.44 - 1.00 mg/dL 0.74 0.87 0.77 0.77 0.72  GFR >60.00 mL/min - - - - -   BP/Weight 11/04/2017 09/09/2017 07/29/2017 07/24/2017 07/20/2017 07/07/2017 53/66/4403  Systolic BP 474 259 96 563 875 643 329  Diastolic BP 74 76 60 70 76 74 76  Wt. (Lbs) 210 210 204 207.38 209.6 208 -  BMI 33.89 33.89 32.93 33.47 33.83 33.57 -   Foot/eye exam completion dates Latest Ref Rng & Units 11/04/2017 09/24/2016  Eye Exam No Retinopathy - -  Foot Form Completion - Done Done        Onychomycosis Terbinafine prescribed x 12 weeks

## 2017-11-04 NOTE — Assessment & Plan Note (Signed)
Terbinafine prescribed x 12 weeks

## 2017-11-04 NOTE — Assessment & Plan Note (Signed)
Controlled, no change in medication DASH diet and commitment to daily physical activity for a minimum of 30 minutes discussed and encouraged, as a part of hypertension management. The importance of attaining a healthy weight is also discussed.  BP/Weight 11/04/2017 09/09/2017 07/29/2017 07/24/2017 07/20/2017 07/07/2017 67/54/4920  Systolic BP 100 712 96 197 588 325 498  Diastolic BP 74 76 60 70 76 74 76  Wt. (Lbs) 210 210 204 207.38 209.6 208 -  BMI 33.89 33.89 32.93 33.47 33.83 33.57 -

## 2017-11-04 NOTE — Assessment & Plan Note (Signed)
Asked : confirms 2 cigarettes daily, much improved,  Assess: wants to quit, will not commit to date Advise: ned to quit  Due to established hear and lung disease. Also to reduce cancer risk Arrange f/u in next months, info re [place] and smoking cessation classes offered, no interest states she has to "do it on her own" Time spent : 5 mins

## 2017-11-04 NOTE — Assessment & Plan Note (Addendum)
[name] is reminded of the importance of commitment to daily physical activity for 30 minutes or more, as able and the need to limit carbohydrate intake to 30 to 60 grams per meal to help with blood sugar control.   [name] is reminded of the importance of daily foot exam, annual eye examination, and good blood sugar, blood pressure and cholesterol control. Updated lab needed at/ before next visit.   Diabetic Labs Latest Ref Rng & Units 09/09/2017 07/29/2017 07/07/2017 06/30/2017 06/29/2017  HbA1c 4.8 - 5.6 % - - - - 6.1(H)  Microalbumin Not estab mg/dL - - - - -  Micro/Creat Ratio <30 mcg/mg creat - - - - -  Chol 0 - 200 mg/dL - - - - 106  HDL >40 mg/dL - - - - 28(L)  Calc LDL 0 - 99 mg/dL - - - - 63  Triglycerides <150 mg/dL - - - - 75  Creatinine 0.44 - 1.00 mg/dL 0.74 0.87 0.77 0.77 0.72  GFR >60.00 mL/min - - - - -   BP/Weight 11/04/2017 09/09/2017 07/29/2017 07/24/2017 07/20/2017 07/07/2017 93/73/4287  Systolic BP 681 157 96 262 035 597 416  Diastolic BP 74 76 60 70 76 74 76  Wt. (Lbs) 210 210 204 207.38 209.6 208 -  BMI 33.89 33.89 32.93 33.47 33.83 33.57 -   Foot/eye exam completion dates Latest Ref Rng & Units 11/04/2017 09/24/2016  Eye Exam No Retinopathy - -  Foot Form Completion - Done Done

## 2017-11-09 ENCOUNTER — Other Ambulatory Visit: Payer: Self-pay | Admitting: Internal Medicine

## 2017-11-09 DIAGNOSIS — I5022 Chronic systolic (congestive) heart failure: Secondary | ICD-10-CM

## 2017-11-10 ENCOUNTER — Ambulatory Visit (INDEPENDENT_AMBULATORY_CARE_PROVIDER_SITE_OTHER): Payer: Self-pay

## 2017-11-10 DIAGNOSIS — T827XXA Infection and inflammatory reaction due to other cardiac and vascular devices, implants and grafts, initial encounter: Secondary | ICD-10-CM

## 2017-11-10 DIAGNOSIS — I5022 Chronic systolic (congestive) heart failure: Secondary | ICD-10-CM

## 2017-11-10 NOTE — Progress Notes (Signed)
EPIC Encounter for ICM Monitoring  Patient Name: [name] is a 52 y.o. female Date: 11/10/2017 Primary Care Physican: Fayrene Helper, MD Primary Cardiologist: Klein/Bensimhon Electrophysiologist: Faustino Congress Weight:Does not weigh  Since 22-Oct-2017 VT-NS (>4 beats, >150 bpm) 369      Heart Failure questions reviewed, pt asymptomatic.   Patient does not follow a low salt diet.  She eats a lot of cheese and explained it is high in salt. Disussed limiting salt to 2000 mg a day and encouraged to read food labels for salt content.    Thoracic impedance abnormal suggesting fluid accumulation starting 11/05/2017.  Prescribed dosage: Furosemide 20 mg 2 tablets (40 mg total)daily.Potassium 20 mEq 1 tablet (20 mEq total) by mouth daily.  Labs: 09/09/2017 Creatinine 0.74, BUN 7,   Potassium 3.9, Sodium 137, EGFR >60 07/29/2017 Creatinine 0.87, BUN 7, Potassium 3.1, Sodium 138, EGFR >60 07/07/2017 Creatinine0.77, BUN8, Potassium4.0, Sodium139 06/30/2017 Creatinine0.77, BUN8, Potassium4.1, Sodium139, EGFR>60  06/29/2017 Creatinine0.72, BUN8, Potassium4.1, Sodium139, EGFR>60  03/23/2017 Creatinine0.72, BUN6, Potassium4.1, Sodium138 03/04/2017 Creatinine0.72, BUN5, Potassium4.6, Sodium140, [id number]  03/02/2017 Creatinine0.68, BUN7, Potassium4.3, [id number], EGFR>89 02/27/2017 Creatinine 0.69, BUN 7, Potassium 2.9, Sodium 136, EGFR >60 (drawn in ER) 02/23/2017 Creatinine 0.72, BUN 8, Potassium 4.6, Sodium 141, EGFR >60 12/30/2016 Creatinine 0.70, BUN 4, Potassium 4.2, Sodium 140, EGFR 100-115 12/05/2016 Creatinine 0.72, BUN 7, Potassium 2.8, Sodium 138, EGFR >60  09/22/2016 Creatinine 0.80, BUN 10, Potassium 4.3, Sodium 138  Recommendations: Advised to limit salt intake to 2000 mg.  Encouraged to call for fluid symptoms.  Follow-up plan: ICM clinic phone appointment on 11/17/2017 to recheck fluid levels.  Office appointment  scheduled 12/09/2017 with Dr. Caryl Comes and with Dr Haroldine Laws.  Copy of ICM check sent to Dr. Caryl Comes and Dr. Haroldine Laws.   3 month ICM trend: 11/09/2017    1 Year ICM trend:       [name], RN 11/10/2017 11:06 AM

## 2017-11-11 NOTE — Telephone Encounter (Signed)
This is Dr. Bensimhon's pt 

## 2017-11-13 ENCOUNTER — Other Ambulatory Visit: Payer: Self-pay | Admitting: Family Medicine

## 2017-11-13 DIAGNOSIS — Z139 Encounter for screening, unspecified: Secondary | ICD-10-CM

## 2017-11-16 ENCOUNTER — Ambulatory Visit (HOSPITAL_COMMUNITY): Payer: Medicare Other

## 2017-11-16 ENCOUNTER — Ambulatory Visit
Admission: RE | Admit: 2017-11-16 | Discharge: 2017-11-16 | Disposition: A | Discharge status: Home / Self Care | Payer: Medicare Other | Source: Ambulatory Visit | Attending: Family Medicine | Admitting: Family Medicine

## 2017-11-16 DIAGNOSIS — Z139 Encounter for screening, unspecified: Secondary | ICD-10-CM

## 2017-11-16 DIAGNOSIS — Z1231 Encounter for screening mammogram for malignant neoplasm of breast: Secondary | ICD-10-CM | POA: Diagnosis not present

## 2017-11-17 ENCOUNTER — Ambulatory Visit (INDEPENDENT_AMBULATORY_CARE_PROVIDER_SITE_OTHER): Payer: Self-pay

## 2017-11-17 DIAGNOSIS — I5022 Chronic systolic (congestive) heart failure: Secondary | ICD-10-CM

## 2017-11-17 DIAGNOSIS — T827XXA Infection and inflammatory reaction due to other cardiac and vascular devices, implants and grafts, initial encounter: Secondary | ICD-10-CM

## 2017-11-17 NOTE — Progress Notes (Signed)
EPIC Encounter for ICM Monitoring  Patient Name: Chantea Surace is a 52 y.o. female Date: 11/17/2017 Primary Care Physican: Fayrene Helper, MD Primary Cardiologist: Klein/Bensimhon Electrophysiologist: Faustino Congress Weight:no weight  Since 09-Nov-2017 VT-NS (>4 beats, >150 bpm) 17      Heart Failure questions reviewed, pt asymptomatic.   Thoracic impedance returned to normal since 11/10/17 ICM transmission after suggesting to limit salt.   Prescribed dosage: Furosemide 20 mg 2 tablets (40 mg total)daily.Potassium 20 mEq 1 tablet (20 mEq total) by mouth daily.  Labs: 09/09/2017 Creatinine 0.74, BUN 7, Potassium 3.9, Sodium 137, EGFR >60 12/12/2018Creatinine 0.87, BUN 7, Potassium 3.1, Sodium 138, EGFR >60 07/07/2017 Creatinine0.77, BUN8, Potassium4.0, Sodium139 06/30/2017 Creatinine0.77, BUN8, Potassium4.1, Sodium139, EGFR>60  06/29/2017 Creatinine0.72, BUN8, Potassium4.1, Sodium139, EGFR>60  03/23/2017 Creatinine0.72, BUN6, Potassium4.1, Sodium138 03/04/2017 Creatinine0.72, BUN5, Potassium4.6, Sodium140, [id number]  03/02/2017 Creatinine0.68, BUN7, Potassium4.3, Sodium139, EGFR>89 02/27/2017 Creatinine 0.69, BUN 7, Potassium 2.9, Sodium 136, EGFR >60 (drawn in ER) 02/23/2017 Creatinine 0.72, BUN 8, Potassium 4.6, Sodium 141, EGFR >60 12/30/2016 Creatinine 0.70, BUN 4, Potassium 4.2, Sodium 140, EGFR 100-115 12/05/2016 Creatinine 0.72, BUN 7, Potassium 2.8, Sodium 138, EGFR >60  09/22/2016 Creatinine 0.80, BUN 10, Potassium 4.3, Sodium 138  Recommendations: No changes.  Follow-up plan: ICM clinic phone appointment on 01/12/2018 since patient has defib office check with Dr Caryl Comes on 12/09/2017 and also an appointment with Dr Haroldine Laws.  Copy of ICM check sent to Dr. Caryl Comes.   3 month ICM trend: 11/17/2017    1 Year ICM trend:       [name], RN 11/17/2017 10:09 AM

## 2017-11-24 ENCOUNTER — Encounter: Payer: Medicare Other | Admitting: Internal Medicine

## 2017-12-05 ENCOUNTER — Other Ambulatory Visit: Payer: Self-pay | Admitting: Family Medicine

## 2017-12-09 ENCOUNTER — Ambulatory Visit (INDEPENDENT_AMBULATORY_CARE_PROVIDER_SITE_OTHER): Payer: Medicare Other | Admitting: Internal Medicine

## 2017-12-09 ENCOUNTER — Ambulatory Visit (HOSPITAL_COMMUNITY)
Admission: RE | Admit: 2017-12-09 | Discharge: 2017-12-09 | Disposition: A | Discharge status: Home / Self Care | Payer: Medicare Other | Source: Ambulatory Visit | Attending: Internal Medicine | Admitting: Internal Medicine

## 2017-12-09 ENCOUNTER — Encounter (HOSPITAL_COMMUNITY): Payer: Self-pay | Admitting: Internal Medicine

## 2017-12-09 ENCOUNTER — Encounter: Payer: Self-pay | Admitting: Internal Medicine

## 2017-12-09 VITALS — BP 100/60 | HR 78 | Wt 205.5 lb

## 2017-12-09 VITALS — BP 94/60 | HR 76 | Ht 66.0 in | Wt 207.0 lb

## 2017-12-09 DIAGNOSIS — G4700 Insomnia, unspecified: Secondary | ICD-10-CM | POA: Insufficient documentation

## 2017-12-09 DIAGNOSIS — F329 Major depressive disorder, single episode, unspecified: Secondary | ICD-10-CM | POA: Diagnosis not present

## 2017-12-09 DIAGNOSIS — T827XXA Infection and inflammatory reaction due to other cardiac and vascular devices, implants and grafts, initial encounter: Secondary | ICD-10-CM | POA: Diagnosis not present

## 2017-12-09 DIAGNOSIS — I5022 Chronic systolic (congestive) heart failure: Secondary | ICD-10-CM | POA: Insufficient documentation

## 2017-12-09 DIAGNOSIS — I472 Ventricular tachycardia, unspecified: Secondary | ICD-10-CM

## 2017-12-09 DIAGNOSIS — Z7982 Long term (current) use of aspirin: Secondary | ICD-10-CM | POA: Insufficient documentation

## 2017-12-09 DIAGNOSIS — I252 Old myocardial infarction: Secondary | ICD-10-CM | POA: Diagnosis not present

## 2017-12-09 DIAGNOSIS — I429 Cardiomyopathy, unspecified: Secondary | ICD-10-CM | POA: Insufficient documentation

## 2017-12-09 DIAGNOSIS — E785 Hyperlipidemia, unspecified: Secondary | ICD-10-CM | POA: Insufficient documentation

## 2017-12-09 DIAGNOSIS — M329 Systemic lupus erythematosus, unspecified: Secondary | ICD-10-CM | POA: Diagnosis not present

## 2017-12-09 DIAGNOSIS — Z720 Tobacco use: Secondary | ICD-10-CM | POA: Diagnosis not present

## 2017-12-09 DIAGNOSIS — K219 Gastro-esophageal reflux disease without esophagitis: Secondary | ICD-10-CM | POA: Insufficient documentation

## 2017-12-09 DIAGNOSIS — E118 Type 2 diabetes mellitus with unspecified complications: Secondary | ICD-10-CM | POA: Diagnosis not present

## 2017-12-09 DIAGNOSIS — I10 Essential (primary) hypertension: Secondary | ICD-10-CM | POA: Diagnosis not present

## 2017-12-09 DIAGNOSIS — I255 Ischemic cardiomyopathy: Secondary | ICD-10-CM

## 2017-12-09 DIAGNOSIS — I2510 Atherosclerotic heart disease of native coronary artery without angina pectoris: Secondary | ICD-10-CM | POA: Insufficient documentation

## 2017-12-09 DIAGNOSIS — Z79899 Other long term (current) drug therapy: Secondary | ICD-10-CM | POA: Diagnosis not present

## 2017-12-09 DIAGNOSIS — Z95810 Presence of automatic (implantable) cardiac defibrillator: Secondary | ICD-10-CM | POA: Insufficient documentation

## 2017-12-09 DIAGNOSIS — I110 Hypertensive heart disease with heart failure: Secondary | ICD-10-CM | POA: Diagnosis not present

## 2017-12-09 DIAGNOSIS — E119 Type 2 diabetes mellitus without complications: Secondary | ICD-10-CM | POA: Insufficient documentation

## 2017-12-09 DIAGNOSIS — F17210 Nicotine dependence, cigarettes, uncomplicated: Secondary | ICD-10-CM | POA: Insufficient documentation

## 2017-12-09 NOTE — Patient Instructions (Signed)
Medication Instructions:  Your physician recommends that you continue on your current medications as directed. Please refer to the Current Medication list given to you today.  Labwork: None ordered.  Testing/Procedures: None ordered.  Follow-Up: Your physician recommends that you schedule a follow-up appointment in:  One Year with Dr Caryl Comes  Remote monitoring is used to monitor your Pacemaker of ICD from home. This monitoring reduces the number of office visits required to check your device to one time per year. It allows [place] to keep an eye on the functioning of your device to ensure it is working properly. You are scheduled for a device check from home on 01/12/2018. You may send your transmission at any time that day. If you have a wireless device, the transmission will be sent automatically. After your physician reviews your transmission, you will receive a postcard with your next transmission date.    Any Other Special Instructions Will Be Listed Below (If Applicable).     If you need a refill on your cardiac medications before your next appointment, please call your pharmacy.

## 2017-12-09 NOTE — Patient Instructions (Signed)
Your physician recommends that you schedule a follow-up appointment in: 4 months with echocardiogram  

## 2017-12-09 NOTE — Progress Notes (Signed)
Electrophysiology Office Note   Date:  12/09/2017   ID:  [name], [name] [birth year], MRN [id number]  PCP:  Fayrene Helper, MD  Cardiologist:   Primary Electrophysiologist:    No chief complaint on file.    History of Present Illness: [name] is a 52 y.o. female  Seen in followup for  s/p ICD implanted for primary prevention and 10/14 underwent generator replacement with revision of her 6949-lead.    DATE TEST    10/17 Echo    EF Severe depression      8/18 Echo   EF 20-25 % LAE(43/2.01/42)  11/18 Myoview EF 13% Large scar w/o ischemia    11/18--  episodes of ventricular tachycardia that were sustained below her detection rate. Monitoring function was activated She has had intercurrent  Ventricular tachycardia and successful ATP but rapid recurring of monomorphic VT.     She was referred to the heart failure clinic 12/18.  She has been seen a couple of times in treatment including earlier today  She has been less short of breath and has had more energy.  Denies chest pain.  Minimal lightheadedness primarily with standing.     Appropriate Therapy yes ATP 2018 Inappropriate Therapy  *  Antiarrhythmics Date Reason stopped  mexiletine 2018 intolerance  Amiodarone 2018 Allergy swelling     Date Cr K Mg  8/18  0.72 4.1 1.8   11/18  0.72 4.1 2.1  1/19 0.74 3.9            Past Medical History:  Diagnosis Date  . Arthritis of knee   . Automatic implantable cardiac defibrillator in situ    a. s/p prior Medtronic ICD with 6949 lead. b. s/p Gen change & lead revision 05/2013.  . Cardiomyopathy secondary    Patient has cardiomyopathy out of proportion to her ischemic heart disease  . coronary artery disease    RCA stenting Myoview 2011 EF 30% infarction dilatation without ischemia  . Depression   . Diabetes mellitus   . GERD (gastroesophageal reflux disease)   . Insomnia   . Lupus (systemic lupus erythematosus) ([place])   . Migraines   .  Myocardial infarction ([place])    8 total   . Nicotine abuse   . Other and unspecified hyperlipidemia   . Paroxysmal VT ([place])   . Systolic CHF ([place])   . VF (ventricular fibrillation) ([place])    a. Hx appropriate ICD therapy for VF.    Current Outpatient Medications  Medication Sig Dispense Refill  . albuterol (PROVENTIL HFA;VENTOLIN HFA) 108 (90 Base) MCG/ACT inhaler Inhale 2 puffs into the lungs every 6 (six) hours as needed for wheezing or shortness of breath. 1 Inhaler 0  . ALPRAZolam (XANAX) 1 MG tablet One tablet at bedtime 30 tablet 3  . aspirin 81 MG tablet Take 1 tablet (81 mg total) by mouth daily. 30 tablet 6  . atorvastatin (LIPITOR) 10 MG tablet Take 1 tablet (10 mg total) by mouth daily. 90 tablet 2  . budesonide-formoterol (SYMBICORT) 80-4.5 MCG/ACT inhaler Inhale 2 puffs into the lungs 2 (two) times daily. 1 Inhaler 3  . carvedilol (COREG) 12.5 MG tablet Take 1 tablet (12.5 mg total) by mouth 2 (two) times daily. 60 tablet 6  . clopidogrel (PLAVIX) 75 MG tablet Take 1 tablet (75 mg total) by mouth daily. 30 tablet 3  . cyclobenzaprine (FLEXERIL) 5 MG tablet Take 1 tablet (5 mg total) by mouth at bedtime. 30 tablet 0  .  fenofibrate (TRICOR) 145 MG tablet Take 145 mg by mouth daily.     . furosemide (LASIX) 20 MG tablet Take 2 tablets (40 mg total) by mouth daily. 60 tablet 3  . losartan (COZAAR) 25 MG tablet Take 1 tablet (25 mg total) by mouth daily. 90 tablet 3  . montelukast (SINGULAIR) 10 MG tablet Take 1 tablet (10 mg total) by mouth at bedtime. 30 tablet 3  . oxyCODONE (ROXICODONE) 15 MG immediate release tablet Take 30 mg 2 (two) times daily by mouth.     . potassium chloride SA (K-DUR,KLOR-CON) 20 MEQ tablet Take 20 mEq by mouth 2 (two) times daily.    Marland Kitchen spironolactone (ALDACTONE) 25 MG tablet Take 0.5 tablets (12.5 mg total) by mouth daily. 45 tablet 3  . terbinafine (LAMISIL) 250 MG tablet Take 1 tablet (250 mg total) by mouth daily. 42 tablet 1   No current  facility-administered medications for this visit.     Allergies:   Amiodarone; Bee venom; Mexiletine; Cortisone; and Robaxin [methocarbamol]   Social History:  The patient  reports that she has been smoking cigarettes.  She has a 7.50 pack-year smoking history. She has never used smokeless tobacco. She reports that she drinks alcohol. She reports that she does not use drugs.   Family History:  The patient's family history includes Cancer in her mother; Dementia in her father; Diabetes in her brother, mother, and sister; HIV in her sister; Heart disease in her brother and mother; Hepatitis (age of onset: 48) in her mother; Hyperlipidemia in her brother, mother, and sister; Liver cancer (age of onset: 35) in her mother; Stroke (age of onset: 14) in her sister.    ROS:  Please see the history of present illness.   Positive for ,   All other systems are reviewed and negative.    PHYSICAL EXAM: VS:  BP 94/60   Pulse 76   Ht 5\' 6"  (1.676 m)   Wt 207 lb (93.9 kg)   SpO2 97%   BMI 33.41 kg/m  , BMI Body mass index is 33.41 kg/m. Well developed and nourished in no acute distress HENT normal Neck supple with JVP-flat Clear Regular rate and rhythm, no murmurs or gallops Abd-soft with active BS No Clubbing cyanosis edema Skin-warm and dry A & Oriented  Grossly normal sensory and motor function     EKG: snius at 76 excitable 16/13/44 ST-T changes inferolaterally consistent with left ventricular hypertrophy and rare PVC   Device interrogation is reviewed today in detail.  See PaceArt for details.  Recent Labs: 03/02/2017: ALT 15 06/30/2017: Magnesium 2.1 07/07/2017: Hemoglobin 11.9; Platelets 294 09/09/2017: BUN 7; Creatinine, Ser 0.74; Potassium 3.9; Sodium 137  06/29/2017: Cholesterol 106; HDL 28; LDL Cholesterol 63; Total CHOL/HDL Ratio 3.8; Triglycerides 75; VLDL 15     CrCl cannot be calculated (Patient's most recent lab result is older than the maximum 21 days allowed.).   Wt  Readings from Last 3 Encounters:  12/09/17 207 lb (93.9 kg)  12/09/17 205 lb 8 oz (93.2 kg)  11/04/17 210 lb (95.3 kg)         ASSESSMENT AND PLAN:  Ischemic/nonischemic cardiomyopathy  Implantable defibrillator-Medtronic S./P. revision   Ventricular tachycardia recurrent  Congestive heart failure-chronic-systolic  No intercurrent Ventricular tachycardia  Euvolemic continue current meds  Saw Dr DB this AM  stable      Signed, Virl Axe   12/09/2017 2:33 PM      [facility] [address]  [place] [address]  [phone] (office) [phone] (fax)

## 2017-12-09 NOTE — Progress Notes (Signed)
Advanced Heart Failure Clinic Note   Referring Physician: Dr, Caryl Comes Primary Care: Tula Nakayama, MD Primary Cardiologist: Dr. Caryl Comes  HPI:  [name] is a 52 y.o. female with h/o CAD s/p RCA stenting in 6578, severe systolic HF due to mixed cardiomyopathy EF , recurrent VT s/p MDT ICD, SLE, GERD and ongoing tobacco use referred by Dr. Caryl Comes for further evaluation and treatment of HF.   EF 8/18: EF 20-25%. RV normal. Dilated LV with moderate MR   Last cath 2004 EF 29% Left main has a distal 30% stenosis. Left anterior descending artery has an ostial 30% stenosis.    Left circumflex gives rise to a very large branching ramus intermedius, two  small marginal branches, and two small posterolateral branches.  The ramus  intermedius gives off two branches.  The more medial branch has a 95%  stenosis at its ostium.  However, this is a small caliber branch being less  than or equal to 1.5 mm in diameter.  The circumflex otherwise has minor  luminal irregularities, but no significant stenoses.  Right coronary artery has a 20% stenosis in the mid vessel.  Just distal to  this there is a stent in the distal portion of the mid vessel which is  widely patent with 0% stenosis within the stent.    CT scan chest: 11/18. No PE. Mild ground glass opacitites. No COPD mentioned  She presents today for regular follow up. Family is with her. Doing well overall. She has SOB walking short distances. She denies lightheadedness or dizziness. No problems with her ADLs. She gets a call from [facility] about once a month for sliding scale diuretics due to her optivol. This is working well and edema is well controlled. Taking all medications as directed apart from taking a whole spironolactone (25 mg) due to difficulty splitting the pill. Denies CP, orthopnea or PND.   Myoview 06/30/17: EF 13% There is a large defect of severe severity present in the basal anteroseptal, basal  inferoseptal, basal inferior, mid anteroseptal, mid inferoseptal, mid inferior and apical inferior location.  CVP 08/04/17 Pre-Exercise PFTs  FVC 2.21 (71%)    FEV1 1.78 (72%)     FEV1/FVC 81 (100%)     MVV 69 (69%)   Exercise Time:  6:15  Speed (mph): 2.0    Grade (%): 7.0   RPE: 16 Reason stopped: patient ended test due to being lightheaded (9/10) Additional symptoms: dyspnea (7/10)  Resting HR: 79 Peak HR: 94  (54% age predicted max HR) BP rest: 86/62 BP peak: 114/70 Peak VO2: 10.0 (50% predicted peak VO2) VE/VCO2 slope: 46 OUES: 1.04 Peak RER: 0.91 Ventilatory Threshold: 9.4 (47% predicted or measured peak VO2) Peak RR 44 Peak Ventilation: 38.3 VE/MVV: 56% PETCO2 at peak: 26 O2pulse: 10  (91% predicted O2pulse)  Review of systems complete and found to be negative unless listed in HPI.    Past Medical History:  Diagnosis Date  . Arthritis of knee   . Automatic implantable cardiac defibrillator in situ    a. s/p prior Medtronic ICD with 6949 lead. b. s/p Gen change & lead revision 05/2013.  . Cardiomyopathy secondary    Patient has cardiomyopathy out of proportion to her ischemic heart disease  . coronary artery disease    RCA stenting Myoview 2011 EF 30% infarction dilatation without ischemia  . Depression   . Diabetes mellitus   . GERD (gastroesophageal reflux disease)   . Insomnia   .  Lupus (systemic lupus erythematosus) ([place])   . Migraines   . Myocardial infarction ([place])    8 total   . Nicotine abuse   . Other and unspecified hyperlipidemia   . Paroxysmal VT ([place])   . Systolic CHF ([place])   . VF (ventricular fibrillation) ([place])    a. Hx appropriate ICD therapy for VF.   Current Outpatient Medications  Medication Sig Dispense Refill  . albuterol (PROVENTIL HFA;VENTOLIN HFA) 108 (90 Base) MCG/ACT inhaler Inhale 2 puffs into the lungs every 6 (six) hours as needed for wheezing or shortness of breath. 1 Inhaler 0  . ALPRAZolam (XANAX) 1  MG tablet One tablet at bedtime 30 tablet 3  . aspirin 81 MG tablet Take 1 tablet (81 mg total) by mouth daily. 30 tablet 6  . atorvastatin (LIPITOR) 10 MG tablet Take 1 tablet (10 mg total) by mouth daily. 90 tablet 2  . budesonide-formoterol (SYMBICORT) 80-4.5 MCG/ACT inhaler Inhale 2 puffs into the lungs 2 (two) times daily. 1 Inhaler 3  . carvedilol (COREG) 12.5 MG tablet Take 1 tablet (12.5 mg total) by mouth 2 (two) times daily. 60 tablet 6  . clopidogrel (PLAVIX) 75 MG tablet Take 1 tablet (75 mg total) by mouth daily. 30 tablet 3  . cyclobenzaprine (FLEXERIL) 5 MG tablet Take 1 tablet (5 mg total) by mouth at bedtime. 30 tablet 0  . fenofibrate (TRICOR) 145 MG tablet Take 145 mg by mouth daily.     . furosemide (LASIX) 20 MG tablet Take 2 tablets (40 mg total) by mouth daily. 60 tablet 3  . losartan (COZAAR) 25 MG tablet Take 1 tablet (25 mg total) by mouth daily. 90 tablet 3  . montelukast (SINGULAIR) 10 MG tablet Take 1 tablet (10 mg total) by mouth at bedtime. 30 tablet 3  . oxyCODONE (ROXICODONE) 15 MG immediate release tablet Take 30 mg 2 (two) times daily by mouth.     . potassium chloride SA (K-DUR,KLOR-CON) 20 MEQ tablet Take 20 mEq by mouth 2 (two) times daily.    Marland Kitchen spironolactone (ALDACTONE) 25 MG tablet Take 0.5 tablets (12.5 mg total) by mouth daily. 45 tablet 3  . terbinafine (LAMISIL) 250 MG tablet Take 1 tablet (250 mg total) by mouth daily. 42 tablet 1   No current facility-administered medications for this encounter.    Allergies  Allergen Reactions  . Amiodarone     Headache and facial swelling  . Bee Venom Anaphylaxis  . Mexiletine   . Cortisone Swelling  . Robaxin [Methocarbamol] Hives and Itching   Social History   Socioeconomic History  . Marital status: Single    Spouse name: Not on file  . Number of children: 3  . Years of education: Not on file  . Highest education level: Not on file  Occupational History  . Occupation: disabled    Fish farm manager:  UNEMPLOYED  Social Needs  . Financial resource strain: Not on file  . Food insecurity:    Worry: Not on file    Inability: Not on file  . Transportation needs:    Medical: Not on file    Non-medical: Not on file  Tobacco Use  . Smoking status: Current Every Day Smoker    Packs/day: 0.50    Years: 15.00    Pack years: 7.50    Types: Cigarettes  . Smokeless tobacco: Never Used  Substance and Sexual Activity  . Alcohol use: Yes    Alcohol/week: 0.0 oz    Comment: occasionaly  .  Drug use: No  . Sexual activity: Not Currently    Birth control/protection: Surgical  Lifestyle  . Physical activity:    Days per week: Not on file    Minutes per session: Not on file  . Stress: Not on file  Relationships  . Social connections:    Talks on phone: Not on file    Gets together: Not on file    Attends religious service: Not on file    Active member of club or organization: Not on file    Attends meetings of clubs or organizations: Not on file    Relationship status: Not on file  . Intimate partner violence:    Fear of current or ex partner: Not on file    Emotionally abused: Not on file    Physically abused: Not on file    Forced sexual activity: Not on file  Other Topics Concern  . Not on file  Social History Narrative  . Not on file   Family History  Problem Relation Age of Onset  . Hepatitis Mother 1       HCV  . Liver cancer Mother 9  . Cancer Mother   . Diabetes Mother   . Heart disease Mother   . Hyperlipidemia Mother   . Stroke Sister 45       x3  . Diabetes Sister   . Hyperlipidemia Sister   . Dementia Father   . HIV Sister   . Diabetes Brother   . Heart disease Brother   . Hyperlipidemia Brother    Vitals:   12/09/17 1314  BP: 100/60  Pulse: 78  SpO2: 98%  Weight: 205 lb 8 oz (93.2 kg)    Wt Readings from Last 3 Encounters:  12/09/17 205 lb 8 oz (93.2 kg)  11/04/17 210 lb (95.3 kg)  09/09/17 210 lb (95.3 kg)    PHYSICAL EXAM: General: No resp  difficulty. HEENT: Normal anicteric Neck: Supple. JVP ~6-7 cm. Carotids 2+ bilat; no bruits. No thyromegaly or nodule noted. Cor: PMI nondisplaced. RRR, 2/6 SEM at LSB. Lungs: CTAB, normal effort. Abdomen: Soft, non-tender, non-distended, no HSM. No bruits or masses. +BS  Extremities: No cyanosis, clubbing, or rash. Trace ankle edema at most.  Neuro: Alert & orientedx3, cranial nerves grossly intact. moves all 4 extremities w/o difficulty. Affect flat but appropriate.  ICD: Medtronic: Interrogated personally in clinic. No VT/VF. Fluid index low/normal currently. Pt activity ~2 hrs daily. Thoracic impedence stable.   ASSESSMENT & PLAN:  1. Chronic systolic HF - She has long-standing mixed ischemic and NICM. EF 20-25% by echo 8/18. 13% by Myoview 11/18. S/p MDT ICD - NYHA II symptomsf- Volume status stable on exam.   - Cotninue lasix 40 mg daily. Can take extra 40 mg daily as needed. - Continue carvedilol 12.5 mg BID - Continue losartan 25 mg daily. No room for Entresto today.  - Continue spiro 25 mg daily.  - CPX 08/04/17 at least moderate limitation from cardiac perspective.  - Previously we have had discussion about possible need for advanced therapies and need for further risk stratification with CPX or cath depending on how much she improves after diuresis.  Relatively stable currently. Will proceed to [facility] if functional status worsens. Pt hesitant to consider transplant process or advanced therapies. - Discussed absolute need to stop smoking to make her eligible for transplant process - Reinforced fluid restriction to < 2 L daily, sodium restriction to less than 2000 mg daily, and the importance of daily  weights.   - Repeat Echo 3-4 months.  2. CAD - s/p remote RCA stent. - No s/s of ischemia.    - last cath 2004. - Myoview from 11/18 reviewed personally. EF 13% no obvious ischemia - Continue ASA and statin.  3. HTN - Soft, historically have been unable to titrate meds.   4.  Tobacco use - Has stopped smoking since 07/2017   5. VT - Managed by Dr. Caryl Comes. S/p MDT ICD. Sees in Office today.  - None noted on ICD interrogation.  - No change to current plan.    6. SLE - Followed by Rheumatologist at [facility]. Has been quiescent for many years.  - No change to current plan.    Doing well overall, though tenuous with at least moderate limitation on CPX. RTC 4 months with Echo.  No room to increase meds today.   Shirley Friar, PA-C  1:28 PM  Patient seen and examined with the above-signed Advanced Practice Provider and/or Housestaff. I personally reviewed laboratory data, imaging studies and relevant notes. I independently examined the patient and formulated the important aspects of the plan. I have edited the note to reflect any of my changes or salient points. I have personally discussed the plan with the patient and/or family.  Overall doing well. NYHA II. Volume status looks good and well managed with the help of remote Optivol monitoring. BP soft so would not titrate meds further at this time. Will need repeat echo at next visit. She knows to call if symptoms worsening. CAD stable without angina.   Glori Bickers, MD  6:05 PM

## 2017-12-10 ENCOUNTER — Encounter: Payer: Medicare Other | Admitting: Internal Medicine

## 2017-12-15 LAB — CUP PACEART INCLINIC DEVICE CHECK
Brady Statistic RV Percent Paced: 0.01 %
Date Time Interrogation Session: 20190424181230
HIGH POWER IMPEDANCE MEASURED VALUE: 89 Ohm
Implantable Lead Implant Date: 20141031
Implantable Pulse Generator Implant Date: 20141031
Lead Channel Impedance Value: 399 Ohm
Lead Channel Impedance Value: 475 Ohm
Lead Channel Pacing Threshold Amplitude: 0.625 V
Lead Channel Pacing Threshold Pulse Width: 0.4 ms
Lead Channel Sensing Intrinsic Amplitude: 5.75 mV
Lead Channel Sensing Intrinsic Amplitude: 7.875 mV
Lead Channel Setting Pacing Pulse Width: 0.4 ms
MDC IDC LEAD LOCATION: 753860
MDC IDC MSMT BATTERY REMAINING LONGEVITY: 97 mo
MDC IDC MSMT BATTERY VOLTAGE: 3 V
MDC IDC SET LEADCHNL RV PACING AMPLITUDE: 2.5 V
MDC IDC SET LEADCHNL RV SENSING SENSITIVITY: 0.3 mV

## 2017-12-21 ENCOUNTER — Other Ambulatory Visit (HOSPITAL_COMMUNITY): Payer: Self-pay | Admitting: *Deleted

## 2017-12-21 MED ORDER — CARVEDILOL 12.5 MG PO TABS: 12.5000 mg | ORAL_TABLET | Freq: Two times a day (BID) | ORAL | 6 refills | Status: DC

## 2017-12-24 ENCOUNTER — Other Ambulatory Visit: Payer: Self-pay

## 2017-12-24 ENCOUNTER — Observation Stay (HOSPITAL_COMMUNITY)
Admission: EM | Admit: 2017-12-24 | Discharge: 2017-12-25 | Disposition: A | Discharge status: Home / Self Care | Payer: Medicare Other | Attending: Internal Medicine | Admitting: Internal Medicine

## 2017-12-24 ENCOUNTER — Encounter (HOSPITAL_COMMUNITY): Payer: Self-pay | Admitting: Emergency Medicine

## 2017-12-24 DIAGNOSIS — I2510 Atherosclerotic heart disease of native coronary artery without angina pectoris: Secondary | ICD-10-CM | POA: Diagnosis not present

## 2017-12-24 DIAGNOSIS — Z79891 Long term (current) use of opiate analgesic: Secondary | ICD-10-CM | POA: Diagnosis not present

## 2017-12-24 DIAGNOSIS — T148XXA Other injury of unspecified body region, initial encounter: Secondary | ICD-10-CM | POA: Diagnosis not present

## 2017-12-24 DIAGNOSIS — R55 Syncope and collapse: Secondary | ICD-10-CM | POA: Diagnosis not present

## 2017-12-24 DIAGNOSIS — M329 Systemic lupus erythematosus, unspecified: Secondary | ICD-10-CM | POA: Insufficient documentation

## 2017-12-24 DIAGNOSIS — E876 Hypokalemia: Secondary | ICD-10-CM | POA: Diagnosis not present

## 2017-12-24 DIAGNOSIS — I110 Hypertensive heart disease with heart failure: Secondary | ICD-10-CM | POA: Diagnosis not present

## 2017-12-24 DIAGNOSIS — F419 Anxiety disorder, unspecified: Secondary | ICD-10-CM | POA: Insufficient documentation

## 2017-12-24 DIAGNOSIS — E118 Type 2 diabetes mellitus with unspecified complications: Secondary | ICD-10-CM | POA: Insufficient documentation

## 2017-12-24 DIAGNOSIS — Z79899 Other long term (current) drug therapy: Secondary | ICD-10-CM | POA: Diagnosis not present

## 2017-12-24 DIAGNOSIS — Z955 Presence of coronary angioplasty implant and graft: Secondary | ICD-10-CM | POA: Diagnosis not present

## 2017-12-24 DIAGNOSIS — F17210 Nicotine dependence, cigarettes, uncomplicated: Secondary | ICD-10-CM | POA: Diagnosis not present

## 2017-12-24 DIAGNOSIS — R002 Palpitations: Secondary | ICD-10-CM | POA: Diagnosis not present

## 2017-12-24 DIAGNOSIS — I252 Old myocardial infarction: Secondary | ICD-10-CM | POA: Insufficient documentation

## 2017-12-24 DIAGNOSIS — S0990XA Unspecified injury of head, initial encounter: Secondary | ICD-10-CM | POA: Diagnosis not present

## 2017-12-24 DIAGNOSIS — M25521 Pain in right elbow: Secondary | ICD-10-CM | POA: Diagnosis not present

## 2017-12-24 DIAGNOSIS — Z7902 Long term (current) use of antithrombotics/antiplatelets: Secondary | ICD-10-CM | POA: Diagnosis not present

## 2017-12-24 DIAGNOSIS — Z7982 Long term (current) use of aspirin: Secondary | ICD-10-CM | POA: Diagnosis not present

## 2017-12-24 DIAGNOSIS — Z95810 Presence of automatic (implantable) cardiac defibrillator: Secondary | ICD-10-CM | POA: Diagnosis not present

## 2017-12-24 DIAGNOSIS — K219 Gastro-esophageal reflux disease without esophagitis: Secondary | ICD-10-CM | POA: Diagnosis present

## 2017-12-24 DIAGNOSIS — E785 Hyperlipidemia, unspecified: Secondary | ICD-10-CM | POA: Diagnosis not present

## 2017-12-24 DIAGNOSIS — I5022 Chronic systolic (congestive) heart failure: Secondary | ICD-10-CM | POA: Insufficient documentation

## 2017-12-24 DIAGNOSIS — Z7901 Long term (current) use of anticoagulants: Secondary | ICD-10-CM | POA: Insufficient documentation

## 2017-12-24 DIAGNOSIS — I5020 Unspecified systolic (congestive) heart failure: Secondary | ICD-10-CM | POA: Diagnosis present

## 2017-12-24 DIAGNOSIS — M25511 Pain in right shoulder: Secondary | ICD-10-CM | POA: Diagnosis not present

## 2017-12-24 DIAGNOSIS — I472 Ventricular tachycardia: Secondary | ICD-10-CM

## 2017-12-24 DIAGNOSIS — S0993XA Unspecified injury of face, initial encounter: Secondary | ICD-10-CM | POA: Diagnosis not present

## 2017-12-24 DIAGNOSIS — R7302 Impaired glucose tolerance (oral): Secondary | ICD-10-CM | POA: Diagnosis present

## 2017-12-24 DIAGNOSIS — R079 Chest pain, unspecified: Secondary | ICD-10-CM | POA: Diagnosis not present

## 2017-12-24 DIAGNOSIS — S199XXA Unspecified injury of neck, initial encounter: Secondary | ICD-10-CM | POA: Diagnosis not present

## 2017-12-24 DIAGNOSIS — S59901A Unspecified injury of right elbow, initial encounter: Secondary | ICD-10-CM | POA: Diagnosis not present

## 2017-12-24 DIAGNOSIS — I4729 Other ventricular tachycardia: Secondary | ICD-10-CM

## 2017-12-24 NOTE — ED Triage Notes (Signed)
Pt states she felt her heart racing and stood up and the next thing she remember is her aunt calling her. Pt struck both knees and right side of head.

## 2017-12-25 ENCOUNTER — Emergency Department (HOSPITAL_COMMUNITY): Payer: Medicare Other

## 2017-12-25 ENCOUNTER — Other Ambulatory Visit (HOSPITAL_COMMUNITY): Payer: Medicare Other

## 2017-12-25 ENCOUNTER — Telehealth: Payer: Self-pay

## 2017-12-25 ENCOUNTER — Other Ambulatory Visit: Payer: Self-pay

## 2017-12-25 ENCOUNTER — Encounter (HOSPITAL_COMMUNITY): Payer: Self-pay | Admitting: *Deleted

## 2017-12-25 DIAGNOSIS — S199XXA Unspecified injury of neck, initial encounter: Secondary | ICD-10-CM | POA: Diagnosis not present

## 2017-12-25 DIAGNOSIS — R079 Chest pain, unspecified: Secondary | ICD-10-CM | POA: Diagnosis not present

## 2017-12-25 DIAGNOSIS — R55 Syncope and collapse: Principal | ICD-10-CM | POA: Diagnosis present

## 2017-12-25 DIAGNOSIS — S0990XA Unspecified injury of head, initial encounter: Secondary | ICD-10-CM | POA: Diagnosis not present

## 2017-12-25 DIAGNOSIS — I4901 Ventricular fibrillation: Secondary | ICD-10-CM

## 2017-12-25 DIAGNOSIS — M25521 Pain in right elbow: Secondary | ICD-10-CM | POA: Diagnosis not present

## 2017-12-25 DIAGNOSIS — S59901A Unspecified injury of right elbow, initial encounter: Secondary | ICD-10-CM | POA: Diagnosis not present

## 2017-12-25 DIAGNOSIS — E876 Hypokalemia: Secondary | ICD-10-CM | POA: Diagnosis present

## 2017-12-25 DIAGNOSIS — S0993XA Unspecified injury of face, initial encounter: Secondary | ICD-10-CM | POA: Diagnosis not present

## 2017-12-25 DIAGNOSIS — Z79899 Other long term (current) drug therapy: Secondary | ICD-10-CM

## 2017-12-25 LAB — CBC WITH DIFFERENTIAL/PLATELET
Basophils Absolute: 0.1 10*3/uL (ref 0.0–0.1)
Basophils Relative: 1 %
EOS ABS: 0.1 10*3/uL (ref 0.0–0.7)
Eosinophils Relative: 1 %
HCT: 36.7 % (ref 36.0–46.0)
Hemoglobin: 11.8 g/dL — ABNORMAL LOW (ref 12.0–15.0)
LYMPHS ABS: 2.3 10*3/uL (ref 0.7–4.0)
LYMPHS PCT: 42 %
MCH: 26.9 pg (ref 26.0–34.0)
MCHC: 32.2 g/dL (ref 30.0–36.0)
MCV: 83.6 fL (ref 78.0–100.0)
Monocytes Absolute: 0.3 10*3/uL (ref 0.1–1.0)
Monocytes Relative: 5 %
NEUTROS PCT: 51 %
Neutro Abs: 2.8 10*3/uL (ref 1.7–7.7)
Platelets: 257 10*3/uL (ref 150–400)
RBC: 4.39 MIL/uL (ref 3.87–5.11)
RDW: 13.1 % (ref 11.5–15.5)
WBC: 5.4 10*3/uL (ref 4.0–10.5)

## 2017-12-25 LAB — CBC
HEMATOCRIT: 37.5 % (ref 36.0–46.0)
Hemoglobin: 12.1 g/dL (ref 12.0–15.0)
MCH: 27.1 pg (ref 26.0–34.0)
MCHC: 32.3 g/dL (ref 30.0–36.0)
MCV: 84.1 fL (ref 78.0–100.0)
Platelets: 264 10*3/uL (ref 150–400)
RBC: 4.46 MIL/uL (ref 3.87–5.11)
RDW: 13 % (ref 11.5–15.5)
WBC: 6.4 10*3/uL (ref 4.0–10.5)

## 2017-12-25 LAB — COMPREHENSIVE METABOLIC PANEL
ALT: 19 U/L (ref 14–54)
AST: 22 U/L (ref 15–41)
Albumin: 3.5 g/dL (ref 3.5–5.0)
Alkaline Phosphatase: 81 U/L (ref 38–126)
Anion gap: 7 (ref 5–15)
BILIRUBIN TOTAL: 0.8 mg/dL (ref 0.3–1.2)
BUN: 9 mg/dL (ref 6–20)
CO2: 29 mmol/L (ref 22–32)
Calcium: 8.8 mg/dL — ABNORMAL LOW (ref 8.9–10.3)
Chloride: 101 mmol/L (ref 101–111)
Creatinine, Ser: 0.77 mg/dL (ref 0.44–1.00)
GFR calc Af Amer: >60 mL/min (ref >60)
Glucose, Bld: 123 mg/dL — ABNORMAL HIGH (ref 65–99)
POTASSIUM: 3.7 mmol/L (ref 3.5–5.1)
Sodium: 137 mmol/L (ref 135–145)
TOTAL PROTEIN: 7.7 g/dL (ref 6.5–8.1)

## 2017-12-25 LAB — PHOSPHORUS: PHOSPHORUS: 3.1 mg/dL (ref 2.5–4.6)

## 2017-12-25 LAB — I-STAT TROPONIN, ED: TROPONIN I, POC: 0.03 ng/mL (ref 0.00–0.08)

## 2017-12-25 LAB — BRAIN NATRIURETIC PEPTIDE: B Natriuretic Peptide: 113 pg/mL — ABNORMAL HIGH (ref 0.0–100.0)

## 2017-12-25 LAB — MRSA PCR SCREENING: MRSA BY PCR: NEGATIVE

## 2017-12-25 LAB — BASIC METABOLIC PANEL
Anion gap: 10 (ref 5–15)
BUN: 8 mg/dL (ref 6–20)
CALCIUM: 9 mg/dL (ref 8.9–10.3)
CO2: 28 mmol/L (ref 22–32)
Chloride: 100 mmol/L — ABNORMAL LOW (ref 101–111)
Creatinine, Ser: 0.84 mg/dL (ref 0.44–1.00)
GFR calc Af Amer: >60 mL/min (ref >60)
Glucose, Bld: 136 mg/dL — ABNORMAL HIGH (ref 65–99)
POTASSIUM: 2.5 mmol/L — AB (ref 3.5–5.1)
SODIUM: 138 mmol/L (ref 135–145)

## 2017-12-25 LAB — TROPONIN I
Troponin I: 0.05 ng/mL (ref <0.03)
Troponin I: 0.03 ng/mL (ref <0.03)

## 2017-12-25 LAB — MAGNESIUM: MAGNESIUM: 1.6 mg/dL — AB (ref 1.7–2.4)

## 2017-12-25 MED ORDER — MAGNESIUM OXIDE 400 (241.3 MG) MG PO TABS: 400.0000 mg | ORAL_TABLET | Freq: Every day | ORAL | 3 refills | Status: AC

## 2017-12-25 MED ORDER — FENOFIBRATE 160 MG PO TABS
160.0000 mg | ORAL_TABLET | Freq: Every day | ORAL | Status: DC
  Administered 2017-12-25: 160 mg via ORAL
  Filled 2017-12-25 (×4): qty 1

## 2017-12-25 MED ORDER — SPIRONOLACTONE 25 MG PO TABS
12.5000 mg | ORAL_TABLET | Freq: Every day | ORAL | Status: DC
  Administered 2017-12-25: 12.5 mg via ORAL
  Filled 2017-12-25 (×4): qty 1

## 2017-12-25 MED ORDER — POTASSIUM CHLORIDE 10 MEQ/100ML IV SOLN
10.0000 meq | INTRAVENOUS | Status: AC
  Administered 2017-12-25 (×3): 10 meq via INTRAVENOUS
  Filled 2017-12-25 (×3): qty 100

## 2017-12-25 MED ORDER — MAGNESIUM OXIDE 400 (241.3 MG) MG PO TABS: 400.0000 mg | ORAL_TABLET | Freq: Every day | ORAL | Status: DC

## 2017-12-25 MED ORDER — ASPIRIN EC 81 MG PO TBEC
81.0000 mg | DELAYED_RELEASE_TABLET | Freq: Every day | ORAL | Status: DC
  Administered 2017-12-25: 81 mg via ORAL
  Filled 2017-12-25: qty 1

## 2017-12-25 MED ORDER — POTASSIUM CHLORIDE CRYS ER 20 MEQ PO TBCR
40.0000 meq | EXTENDED_RELEASE_TABLET | Freq: Two times a day (BID) | ORAL | Status: DC
  Administered 2017-12-25 (×2): 40 meq via ORAL
  Filled 2017-12-25 (×2): qty 2

## 2017-12-25 MED ORDER — OXYCODONE HCL 5 MG PO TABS: 5.0000 mg | ORAL_TABLET | ORAL | Status: DC | PRN

## 2017-12-25 MED ORDER — PANTOPRAZOLE SODIUM 40 MG PO TBEC
40.0000 mg | DELAYED_RELEASE_TABLET | Freq: Every day | ORAL | Status: DC
  Administered 2017-12-25: 40 mg via ORAL
  Filled 2017-12-25: qty 1

## 2017-12-25 MED ORDER — POTASSIUM CHLORIDE CRYS ER 20 MEQ PO TBCR
40.0000 meq | EXTENDED_RELEASE_TABLET | Freq: Once | ORAL | Status: AC
  Administered 2017-12-25: 40 meq via ORAL
  Filled 2017-12-25: qty 2

## 2017-12-25 MED ORDER — MOMETASONE FURO-FORMOTEROL FUM 100-5 MCG/ACT IN AERO
2.0000 | INHALATION_SPRAY | Freq: Two times a day (BID) | RESPIRATORY_TRACT | Status: DC
  Filled 2017-12-25: qty 8.8

## 2017-12-25 MED ORDER — KETOROLAC TROMETHAMINE 30 MG/ML IJ SOLN: 30.0000 mg | Freq: Four times a day (QID) | INTRAMUSCULAR | Status: DC | PRN

## 2017-12-25 MED ORDER — TERBINAFINE HCL 250 MG PO TABS
250.0000 mg | ORAL_TABLET | Freq: Every day | ORAL | Status: DC
  Administered 2017-12-25: 250 mg via ORAL
  Filled 2017-12-25 (×4): qty 1

## 2017-12-25 MED ORDER — MONTELUKAST SODIUM 10 MG PO TABS: 10.0000 mg | ORAL_TABLET | Freq: Every day | ORAL | Status: DC

## 2017-12-25 MED ORDER — FUROSEMIDE 40 MG PO TABS
40.0000 mg | ORAL_TABLET | Freq: Every day | ORAL | Status: DC
  Administered 2017-12-25: 40 mg via ORAL
  Filled 2017-12-25: qty 1

## 2017-12-25 MED ORDER — OXYCODONE-ACETAMINOPHEN 5-325 MG PO TABS
1.0000 | ORAL_TABLET | Freq: Once | ORAL | Status: AC
  Administered 2017-12-25: 1 via ORAL
  Filled 2017-12-25: qty 1

## 2017-12-25 MED ORDER — ALPRAZOLAM 0.5 MG PO TABS: 1.0000 mg | ORAL_TABLET | Freq: Every evening | ORAL | Status: DC | PRN

## 2017-12-25 MED ORDER — MAGNESIUM SULFATE 2 GM/50ML IV SOLN
2.0000 g | Freq: Once | INTRAVENOUS | Status: AC
  Administered 2017-12-25: 2 g via INTRAVENOUS
  Filled 2017-12-25: qty 50

## 2017-12-25 MED ORDER — POTASSIUM CHLORIDE CRYS ER 20 MEQ PO TBCR: 60.0000 meq | EXTENDED_RELEASE_TABLET | Freq: Two times a day (BID) | ORAL | Status: DC

## 2017-12-25 MED ORDER — OXYCODONE HCL ER 15 MG PO T12A
30.0000 mg | EXTENDED_RELEASE_TABLET | Freq: Two times a day (BID) | ORAL | Status: DC
  Administered 2017-12-25: 30 mg via ORAL
  Filled 2017-12-25: qty 2

## 2017-12-25 MED ORDER — CYCLOBENZAPRINE HCL 10 MG PO TABS: 5.0000 mg | ORAL_TABLET | Freq: Every day | ORAL | Status: DC

## 2017-12-25 MED ORDER — CARVEDILOL 12.5 MG PO TABS
12.5000 mg | ORAL_TABLET | Freq: Two times a day (BID) | ORAL | Status: DC
  Administered 2017-12-25: 12.5 mg via ORAL
  Filled 2017-12-25: qty 1

## 2017-12-25 MED ORDER — ATORVASTATIN CALCIUM 10 MG PO TABS
10.0000 mg | ORAL_TABLET | Freq: Every day | ORAL | Status: DC
  Administered 2017-12-25: 10 mg via ORAL
  Filled 2017-12-25: qty 1

## 2017-12-25 MED ORDER — LOSARTAN POTASSIUM 25 MG PO TABS
25.0000 mg | ORAL_TABLET | Freq: Every day | ORAL | Status: DC
  Administered 2017-12-25: 25 mg via ORAL
  Filled 2017-12-25: qty 1

## 2017-12-25 MED ORDER — POTASSIUM CHLORIDE CRYS ER 20 MEQ PO TBCR: 60.0000 meq | EXTENDED_RELEASE_TABLET | Freq: Two times a day (BID) | ORAL | 3 refills | Status: DC

## 2017-12-25 MED ORDER — CLOPIDOGREL BISULFATE 75 MG PO TABS
75.0000 mg | ORAL_TABLET | Freq: Every day | ORAL | Status: DC
  Administered 2017-12-25: 75 mg via ORAL
  Filled 2017-12-25: qty 1

## 2017-12-25 NOTE — ED Notes (Signed)
Family at bedside. Pt talking to visitors

## 2017-12-25 NOTE — ED Notes (Signed)
Pt unable to tolerate IV potassium at prescribed rate. Ok by edp to slow rate to provide comfort to patient

## 2017-12-25 NOTE — Telephone Encounter (Signed)
-----   Message from Desma Paganini sent at 12/25/2017  3:39 PM EDT ----- Harl Bowie call stating this pt is needing a BMET And Magnesium in 1 wk, she's currently admitted upstairs   Thank you.

## 2017-12-25 NOTE — Discharge Summary (Signed)
Physician Discharge Summary  [name]:[id number] DOB: Mar 20, 1966 DOA: 12/24/2017  PCP: Fayrene Helper, MD  Admit date: 12/24/2017  Discharge date: 12/25/2017  Admitted From:Home  Disposition:  Home  Recommendations for Outpatient Follow-up:  1. Follow up with PCP in 1 week for repeat BMET and Mg 2. Follow up with Cardiology as scheduled  Home Health:None  Equipment/Devices:None  Discharge Condition:Stable  CODE STATUS: Full  Diet recommendation: Heart Healthy  Brief/Interim Summary:  [name] is a 52 y.o. female with medical history significant of arthritis of the knee, depression, insomnia, nicotine abuse, GERD, CAD, history of MI x8, chronic systolic CHF, paroxysmal ventricular tachycardia,  history of ventricular fibrillation with ICD therapy who is coming to the emergency department after losing consciousness at home.  She was noted to have an episode of ventricular fibrillation on her ICD with successful single shock which is likely the cause of her syncopal episode.  She is noted to have severe hypokalemia and hypomagnesemia which was likely the exacerbating factor.  She has had magnesium and potassium replacement while being monitored in the stepdown unit.  She has been seen by cardiology with recommendations to discharge home today on increased dose of potassium of 60 mEq twice daily as well as magnesium oxide 400 mg daily on discharge.  Recommendations for repeat BMP and magnesium at 1 week after discharge with close follow-up to cardiology as previously noted.  Discharge Diagnoses:  Principal Problem:   Syncope Active Problems:   Diabetes mellitus type 2 with complications (HCC)   Hyperlipidemia LDL goal <100   Ventricular tachycardia (paroxysmal) (HCC)   GERD (gastroesophageal reflux disease)   Systolic CHF (HCC)   Hypokalemia   Hypomagnesemia  1. Syncope secondary to VT/VF status post successful single shock from ICD.  This is in the  setting of chronic systolic heart failure with LVEF 20 to 25%.  Patient was noted to have severe hypomagnesemia and hypokalemia which was likely the inciting factor for this event.  This was replaced aggressively in stepdown unit in case was reviewed by cardiologist Dr. Harl Bowie who recommends patient for discharge and repeat labs in 1 week.  Magnesium will be increased to 400 mg daily as well as potassium to 60 mEq twice daily with need for repeat labs in 1 week with her primary care physician.  Patient is no longer symptomatic at this time of discharge. 2. Hypokalemia.  Resolved with supplementation.  Home regimen changed to 60 mEq twice daily. 3. Hypomagnesemia.  Start magnesium oxide 400 mg once a day of discharge. 4. Chronic systolic heart failure.  Currently euvolemic. 5. Type 2 diabetes.  Continue management at home with current medications. 6. Dyslipidemia.  Continue statin at home. 7. GERD.  Continue Protonix.  Discharge Instructions  Discharge Instructions    Diet - low sodium heart healthy   Complete by:  As directed    Increase activity slowly   Complete by:  As directed      Allergies as of 12/25/2017      Reactions   Amiodarone    Headache and facial swelling   Bee Venom Anaphylaxis   Mexiletine    Cortisone Swelling   Robaxin [methocarbamol] Hives, Itching      Medication List    TAKE these medications   albuterol 108 (90 Base) MCG/ACT inhaler Commonly known as:  PROVENTIL HFA;VENTOLIN HFA Inhale 2 puffs into the lungs every 6 (six) hours as needed for wheezing or shortness of breath.   ALPRAZolam 1  MG tablet Commonly known as:  XANAX One tablet at bedtime What changed:    how much to take  when to take this  reasons to take this  additional instructions   aspirin 81 MG tablet Take 1 tablet (81 mg total) by mouth daily.   atorvastatin 10 MG tablet Commonly known as:  LIPITOR Take 1 tablet (10 mg total) by mouth daily.   budesonide-formoterol 80-4.5  MCG/ACT inhaler Commonly known as:  SYMBICORT Inhale 2 puffs into the lungs 2 (two) times daily. What changed:    when to take this  reasons to take this   carvedilol 12.5 MG tablet Commonly known as:  COREG Take 1 tablet (12.5 mg total) by mouth 2 (two) times daily.   cholecalciferol 1000 units tablet Commonly known as:  VITAMIN D Take 1,000 Units by mouth daily.   clopidogrel 75 MG tablet Commonly known as:  PLAVIX Take 1 tablet (75 mg total) by mouth daily.   cyclobenzaprine 5 MG tablet Commonly known as:  FLEXERIL Take 1 tablet (5 mg total) by mouth at bedtime.   fenofibrate 145 MG tablet Commonly known as:  TRICOR Take 145 mg by mouth daily.   furosemide 20 MG tablet Commonly known as:  LASIX Take 2 tablets (40 mg total) by mouth daily.   losartan 25 MG tablet Commonly known as:  COZAAR Take 1 tablet (25 mg total) by mouth daily.   magnesium oxide 400 (241.3 Mg) MG tablet Commonly known as:  MAG-OX Take 1 tablet (400 mg total) by mouth daily. Start taking on:  12/26/2017   montelukast 10 MG tablet Commonly known as:  SINGULAIR Take 1 tablet (10 mg total) by mouth at bedtime.   oxyCODONE 15 MG immediate release tablet Commonly known as:  ROXICODONE Take 15 mg by mouth 2 (two) times daily.   potassium chloride SA 20 MEQ tablet Commonly known as:  K-DUR,KLOR-CON Take 3 tablets (60 mEq total) by mouth 2 (two) times daily. What changed:  how much to take   spironolactone 25 MG tablet Commonly known as:  ALDACTONE Take 0.5 tablets (12.5 mg total) by mouth daily. What changed:  how much to take   terbinafine 250 MG tablet Commonly known as:  LAMISIL Take 1 tablet (250 mg total) by mouth daily.      Follow-up Information    Fayrene Helper, MD Follow up in 1 week(s).   Specialty:  Family Medicine Why:  Please repeat BMET and Mg level at this visit. Contact information: [address] [place] [address] [phone]           Allergies  Allergen Reactions  . Amiodarone     Headache and facial swelling  . Bee Venom Anaphylaxis  . Mexiletine   . Cortisone Swelling  . Robaxin [Methocarbamol] Hives and Itching    Consultations:  Cardiology Dr. Harl Bowie   Procedures/Studies: Dg Chest 2 View  Result Date: 12/25/2017 CLINICAL DATA:  52 year old female with fall. Patient is having chest pain. EXAM: CHEST - 2 VIEW COMPARISON:  Chest radiograph dated 06/29/2017 FINDINGS: The lungs are clear. There is no pleural effusion or pneumothorax. Mild cardiomegaly left pectoral AICD device. No acute osseous pathology. IMPRESSION: No active cardiopulmonary disease. Electronically Signed   By: Anner Crete M.D.   On: 12/25/2017 01:27   Dg Elbow Complete Right  Result Date: 12/25/2017 CLINICAL DATA:  52 year old female with fall and right elbow pain. EXAM: RIGHT ELBOW - COMPLETE 3+ VIEW COMPARISON:  None.  FINDINGS: There is no evidence of fracture, dislocation, or joint effusion. There is no evidence of arthropathy or other focal bone abnormality. Soft tissues are unremarkable. IMPRESSION: Negative. Electronically Signed   By: Anner Crete M.D.   On: 12/25/2017 01:29   Ct Head Wo Contrast  Result Date: 12/25/2017 CLINICAL DATA:  52 year old female with fall. EXAM: CT HEAD WITHOUT CONTRAST CT MAXILLOFACIAL WITHOUT CONTRAST CT CERVICAL SPINE WITHOUT CONTRAST TECHNIQUE: Multidetector CT imaging of the head, cervical spine, and maxillofacial structures were performed using the standard protocol without intravenous contrast. Multiplanar CT image reconstructions of the cervical spine and maxillofacial structures were also generated. COMPARISON:  Head CT dated 11/03/2008 FINDINGS: CT HEAD FINDINGS Brain: No evidence of acute infarction, hemorrhage, hydrocephalus, extra-axial collection or mass lesion/mass effect. Vascular: No hyperdense vessel or unexpected calcification. Skull: Normal. Negative for fracture or focal lesion. Other:  Mild right periorbital contusion. CT MAXILLOFACIAL FINDINGS Osseous: No fracture or mandibular dislocation. No destructive process. Orbits: Negative. No traumatic or inflammatory finding. Sinuses: Clear. Soft tissues: Mild right periorbital soft tissue contusion. No hematoma. CT CERVICAL SPINE FINDINGS Alignment: Normal. Skull base and vertebrae: No acute fracture. No primary bone lesion or focal pathologic process. Soft tissues and spinal canal: No prevertebral fluid or swelling. No visible canal hematoma. Disc levels: No acute findings. No significant degenerative changes. Upper chest: Negative. Other: None IMPRESSION: 1. No acute intracranial pathology. 2. No acute facial bone fractures. 3. No acute/traumatic cervical spine pathology. Electronically Signed   By: Anner Crete M.D.   On: 12/25/2017 01:43   Ct Cervical Spine Wo Contrast  Result Date: 12/25/2017 CLINICAL DATA:  52 year old female with fall. EXAM: CT HEAD WITHOUT CONTRAST CT MAXILLOFACIAL WITHOUT CONTRAST CT CERVICAL SPINE WITHOUT CONTRAST TECHNIQUE: Multidetector CT imaging of the head, cervical spine, and maxillofacial structures were performed using the standard protocol without intravenous contrast. Multiplanar CT image reconstructions of the cervical spine and maxillofacial structures were also generated. COMPARISON:  Head CT dated 11/03/2008 FINDINGS: CT HEAD FINDINGS Brain: No evidence of acute infarction, hemorrhage, hydrocephalus, extra-axial collection or mass lesion/mass effect. Vascular: No hyperdense vessel or unexpected calcification. Skull: Normal. Negative for fracture or focal lesion. Other: Mild right periorbital contusion. CT MAXILLOFACIAL FINDINGS Osseous: No fracture or mandibular dislocation. No destructive process. Orbits: Negative. No traumatic or inflammatory finding. Sinuses: Clear. Soft tissues: Mild right periorbital soft tissue contusion. No hematoma. CT CERVICAL SPINE FINDINGS Alignment: Normal. Skull base and  vertebrae: No acute fracture. No primary bone lesion or focal pathologic process. Soft tissues and spinal canal: No prevertebral fluid or swelling. No visible canal hematoma. Disc levels: No acute findings. No significant degenerative changes. Upper chest: Negative. Other: None IMPRESSION: 1. No acute intracranial pathology. 2. No acute facial bone fractures. 3. No acute/traumatic cervical spine pathology. Electronically Signed   By: Anner Crete M.D.   On: 12/25/2017 01:43   Ct Maxillofacial Wo Contrast  Result Date: 12/25/2017 CLINICAL DATA:  52 year old female with fall. EXAM: CT HEAD WITHOUT CONTRAST CT MAXILLOFACIAL WITHOUT CONTRAST CT CERVICAL SPINE WITHOUT CONTRAST TECHNIQUE: Multidetector CT imaging of the head, cervical spine, and maxillofacial structures were performed using the standard protocol without intravenous contrast. Multiplanar CT image reconstructions of the cervical spine and maxillofacial structures were also generated. COMPARISON:  Head CT dated 11/03/2008 FINDINGS: CT HEAD FINDINGS Brain: No evidence of acute infarction, hemorrhage, hydrocephalus, extra-axial collection or mass lesion/mass effect. Vascular: No hyperdense vessel or unexpected calcification. Skull: Normal. Negative for fracture or focal lesion. Other: Mild right periorbital contusion. CT  MAXILLOFACIAL FINDINGS Osseous: No fracture or mandibular dislocation. No destructive process. Orbits: Negative. No traumatic or inflammatory finding. Sinuses: Clear. Soft tissues: Mild right periorbital soft tissue contusion. No hematoma. CT CERVICAL SPINE FINDINGS Alignment: Normal. Skull base and vertebrae: No acute fracture. No primary bone lesion or focal pathologic process. Soft tissues and spinal canal: No prevertebral fluid or swelling. No visible canal hematoma. Disc levels: No acute findings. No significant degenerative changes. Upper chest: Negative. Other: None IMPRESSION: 1. No acute intracranial pathology. 2. No acute  facial bone fractures. 3. No acute/traumatic cervical spine pathology. Electronically Signed   By: Anner Crete M.D.   On: 12/25/2017 01:43    Discharge Exam: Vitals:   12/25/17 0300 12/25/17 0500  BP: 105/76   Pulse: 78   Resp: (!) 22   Temp:  (!) 97.5 F (36.4 C)  SpO2: 96%    Vitals:   12/25/17 0200 12/25/17 0226 12/25/17 0300 12/25/17 0500  BP: 100/64  105/76   Pulse: 84  78   Resp: 20  (!) 22   Temp:  97.8 F (36.6 C)  (!) 97.5 F (36.4 C)  TempSrc:  Oral  Oral  SpO2: 100%  96%   Weight:  94.9 kg (209 lb 3.5 oz)  94.9 kg (209 lb 3.5 oz)  Height:  5\' 6"  (1.676 m)      General: Pt is alert, awake, not in acute distress Cardiovascular: RRR, S1/S2 +, no rubs, no gallops Respiratory: CTA bilaterally, no wheezing, no rhonchi Abdominal: Soft, NT, ND, bowel sounds + Extremities: no edema, no cyanosis    The results of significant diagnostics from this hospitalization (including imaging, microbiology, ancillary and laboratory) are listed below for reference.     Microbiology: Recent Results (from the past 240 hour(s))  MRSA PCR Screening     Status: None   Collection Time: 12/25/17  2:27 AM  Result Value Ref Range Status   MRSA by PCR NEGATIVE NEGATIVE Final    Comment:        The GeneXpert MRSA Assay (FDA approved for NASAL specimens only), is one component of a comprehensive MRSA colonization surveillance program. It is not intended to diagnose MRSA infection nor to guide or monitor treatment for MRSA infections. Performed at [facility], [address] [place], [place] [address]      Labs: BNP (last 3 results) Recent Labs    12/25/17 0017  BNP 299.3*   Basic Metabolic Panel: Recent Labs  Lab 12/25/17 0017 12/25/17 0635  NA 138 137  K 2.5* 3.7  CL 100* 101  CO2 28 29  GLUCOSE 136* 123*  BUN 8 9  CREATININE 0.84 0.77  CALCIUM 9.0 8.8*  MG 1.6*  --   PHOS 3.1  --    Liver Function Tests: Recent Labs  Lab 12/25/17 0635  AST 22  ALT  19  ALKPHOS 81  BILITOT 0.8  PROT 7.7  ALBUMIN 3.5   No results for input(s): LIPASE, AMYLASE in the last 168 hours. No results for input(s): AMMONIA in the last 168 hours. CBC: Recent Labs  Lab 12/25/17 0017 12/25/17 0635  WBC 5.4 6.4  NEUTROABS 2.8  --   HGB 11.8* 12.1  HCT 36.7 37.5  MCV 83.6 84.1  PLT 257 264   Cardiac Enzymes: Recent Labs  Lab 12/25/17 0635 12/25/17 1236  TROPONINI 0.05* 0.03*   BNP: Invalid input(s): POCBNP CBG: No results for input(s): GLUCAP in the last 168 hours. D-Dimer No results for input(s): DDIMER in  the last 72 hours. Hgb A1c No results for input(s): HGBA1C in the last 72 hours. Lipid Profile No results for input(s): CHOL, HDL, LDLCALC, TRIG, CHOLHDL, LDLDIRECT in the last 72 hours. Thyroid function studies No results for input(s): TSH, T4TOTAL, T3FREE, THYROIDAB in the last 72 hours.  Invalid input(s): FREET3 Anemia work up No results for input(s): VITAMINB12, FOLATE, FERRITIN, TIBC, IRON, RETICCTPCT in the last 72 hours. Urinalysis    Component Value Date/Time   COLORURINE YELLOW 12/05/2016 0316   APPEARANCEUR CLEAR 12/05/2016 0316   LABSPEC 1.005 12/05/2016 0316   PHURINE 6.0 12/05/2016 0316   GLUCOSEU NEGATIVE 12/05/2016 0316   HGBUR SMALL (A) 12/05/2016 0316   HGBUR large 08/07/2010 0901   BILIRUBINUR NEGATIVE 12/05/2016 0316   BILIRUBINUR neg 07/31/2016 0854   KETONESUR NEGATIVE 12/05/2016 0316   PROTEINUR NEGATIVE 12/05/2016 0316   UROBILINOGEN 1.0 07/31/2016 0854   UROBILINOGEN 0.2 09/13/2012 2220   NITRITE NEGATIVE 12/05/2016 0316   LEUKOCYTESUR TRACE (A) 12/05/2016 0316   Sepsis Labs Invalid input(s): PROCALCITONIN,  WBC,  LACTICIDVEN Microbiology Recent Results (from the past 240 hour(s))  MRSA PCR Screening     Status: None   Collection Time: 12/25/17  2:27 AM  Result Value Ref Range Status   MRSA by PCR NEGATIVE NEGATIVE Final    Comment:        The GeneXpert MRSA Assay (FDA approved for NASAL  specimens only), is one component of a comprehensive MRSA colonization surveillance program. It is not intended to diagnose MRSA infection nor to guide or monitor treatment for MRSA infections. Performed at [facility], [address] [place], [place] [address]      Time coordinating discharge: 35 minutes  SIGNED:   Rodena Goldmann, DO [facility] 12/25/2017, 4:04 PM Pager [phone]  If 7PM-7AM, please contact night-coverage [email or web address] Password [email or web address]

## 2017-12-25 NOTE — ED Notes (Signed)
Date and time results received: 12/25/17 0052 (use smartphrase ".now" to insert current time)  Test: potassium Critical Value: 2.5  Name of Provider Notified: Dr Betsey Holiday  Orders Received? Or Actions Taken?: Actions Taken: no orders received

## 2017-12-25 NOTE — ED Provider Notes (Signed)
[facility] EMERGENCY DEPARTMENT Provider Note   CSN: [id number] Arrival date & time: 12/24/17  2349     History   Chief Complaint Chief Complaint  Patient presents with  . Loss of Consciousness    HPI [name] is a 52 y.o. female.  Patient presents to the ER by ambulance from home.  Patient had a syncopal episode tonight.  She reports that she was sitting at her computer doing her homework.  She suddenly felt her heart racing.  The next thing she remembers is waking up on the floor.  She reports a headache and right-sided facial pain from the fall.  She did not notice any chest pain or shortness of breath associated with the palpitations.  She does not have any currently.     Past Medical History:  Diagnosis Date  . Arthritis of knee   . Automatic implantable cardiac defibrillator in situ    a. s/p prior Med[employer]onic ICD with 6949 lead. b. s/p Gen change & lead revision 05/2013.  . Cardiomyopathy secondary    Patient has cardiomyopathy out of proportion to her ischemic heart disease  . coronary artery disease    RCA stenting Myoview 2011 EF 30% infarction dilatation without ischemia  . Depression   . Diabetes mellitus   . GERD (gastroesophageal reflux disease)   . Insomnia   . Lupus (systemic lupus erythematosus) ([place])   . Migraines   . Myocardial infarction ([place])    8 total   . Nicotine abuse   . Other and unspecified hyperlipidemia   . Paroxysmal VT ([place])   . Systolic CHF ([place])   . VF (ventricular fibrillation) ([place])    a. Hx appropriate ICD therapy for VF.    Patient Active Problem List   Diagnosis Date Noted  . Chronic systolic heart failure ([place]) 12/09/2017  . Onychomycosis 11/04/2017  . VF (ventricular fibrillation) ([place])   . Systolic CHF ([place])   . Paroxysmal VT ([place])   . Nicotine abuse   . Myocardial infarction ([place])   . Migraines   . Lupus (systemic lupus erythematosus) ([place])   . Insomnia   . Depression   . Arthritis of knee   . Chest  pain with high risk for cardiac etiology 06/29/2017  . GERD (gastroesophageal reflux disease) 06/29/2017  . Muscle spasm of back 03/04/2017  . Vitamin D deficiency 07/26/2015  . Depression with anxiety 03/11/2015  . Metabolic syndrome X 70/26/3785  . Adhesive capsulitis of left shoulder 09/26/2013  . Ventricular tachycardia (paroxysmal) ([place]) 12/17/2012  . Nicotine dependence 11/07/2012  . HSV-2 seropositive 11/05/2012  . H/O abnormal Pap smear 11/02/2012  . Fatty liver 08/10/2012  . HTN, goal below 130/80 09/13/2011  . Allergic rhinitis 09/11/2011  . Ischemic cardiomyopathy 05/27/2011  . Implantable cardioverter-defibrillator (ICD) in situ 05/27/2011  . 6949 defibrillator lead 05/27/2011  . FATIGUE 06/05/2009  . Ventricular fibrillation ([place]) 02/04/2009  . Morbid obesity ([place]) 12/20/2008  . Diabetes mellitus type 2 with complications ([place]) 88/50/2774  . Hyperlipidemia LDL goal <100 08/31/2007  . ANXIETY 08/31/2007  . SLE 08/31/2007  . ARTHRITIS, KNEES, BILATERAL 08/31/2007  . AVASCULAR NECROSIS 08/31/2007  . MIGRAINES, HX OF 08/31/2007    Past Surgical History:  Procedure Laterality Date  . ABDOMINAL HYSTERECTOMY    . CHOLECYSTECTOMY    . COLONOSCOPY  07/15/2011   SLF: internal hemorrhoids/hyperplastic polyps in the rectum/tubular adenomaSURVEILLANCE Nov 2017  . defibrillator placed   2009 and 05/2013  . ICD GENERATOR CHANGE  06/17/2013  Dr Caryl Comes  . IMPLANTABLE CARDIOVERTER DEFIBRILLATOR (ICD) GENERATOR CHANGE Left 06/17/2013   Procedure: ICD GENERATOR CHANGE;  Surgeon: Deboraha Sprang, MD;  Location: [facility] CATH LAB;  Service: Cardiovascular;  Laterality: Left;  . LEAD REVISION N/A 06/17/2013   Procedure: LEAD REVISION;  Surgeon: Deboraha Sprang, MD;  Location: [facility] CATH LAB;  Service: Cardiovascular;  Laterality: N/A;  . ROTATOR CUFF REPAIR Right 2002     OB History   None      Home Medications    Prior to Admission medications   Medication Sig Start Date End Date  Taking? Authorizing Provider  albuterol (PROVENTIL HFA;VENTOLIN HFA) 108 (90 Base) MCG/ACT inhaler Inhale 2 puffs into the lungs every 6 (six) hours as needed for wheezing or shortness of breath. 03/13/16   Fayrene Helper, MD  ALPRAZolam Duanne Moron) 1 MG tablet One tablet at bedtime 11/04/17   Fayrene Helper, MD  aspirin 81 MG tablet Take 1 tablet (81 mg total) by mouth daily. 09/26/13   Deboraha Sprang, MD  atorvastatin (LIPITOR) 10 MG tablet Take 1 tablet (10 mg total) by mouth daily. 07/20/17   Deboraha Sprang, MD  budesonide-formoterol [facility]) 80-4.5 MCG/ACT inhaler Inhale 2 puffs into the lungs 2 (two) times daily. 04/02/16   Fayrene Helper, MD  carvedilol (COREG) 12.5 MG tablet Take 1 tablet (12.5 mg total) by mouth 2 (two) times daily. 12/21/17 03/21/18  Bensimhon, Shaune Pascal, MD  clopidogrel (PLAVIX) 75 MG tablet Take 1 tablet (75 mg total) by mouth daily. 11/04/17   Fayrene Helper, MD  cyclobenzaprine (FLEXERIL) 5 MG tablet Take 1 tablet (5 mg total) by mouth at bedtime. 11/04/17   Fayrene Helper, MD  fenofibrate (TRICOR) 145 MG tablet Take 145 mg by mouth daily.  04/15/13   [provider]  furosemide (LASIX) 20 MG tablet Take 2 tablets (40 mg total) by mouth daily. 11/11/17   Bensimhon, Shaune Pascal, MD  losartan (COZAAR) 25 MG tablet Take 1 tablet (25 mg total) by mouth daily. 07/20/17 12/10/18  Deboraha Sprang, MD  montelukast (SINGULAIR) 10 MG tablet Take 1 tablet (10 mg total) by mouth at bedtime. 11/04/17   Fayrene Helper, MD  oxyCODONE (ROXICODONE) 15 MG immediate release tablet Take 30 mg 2 (two) times daily by mouth.  04/10/16   [provider]  potassium chloride SA (K-DUR,KLOR-CON) 20 MEQ tablet Take 20 mEq by mouth 2 (two) times daily.    [provider]  spironolactone (ALDACTONE) 25 MG tablet Take 0.5 tablets (12.5 mg total) by mouth daily. 07/24/17 12/10/18  Bensimhon, Shaune Pascal, MD  terbinafine (LAMISIL) 250 MG tablet Take 1 tablet (250 mg  total) by mouth daily. 11/04/17   Fayrene Helper, MD    Family History Family History  Problem Relation Age of Onset  . Hepatitis Mother 5       HCV  . Liver cancer Mother 77  . Cancer Mother   . Diabetes Mother   . Heart disease Mother   . Hyperlipidemia Mother   . Stroke Sister 45       x3  . Diabetes Sister   . Hyperlipidemia Sister   . Dementia Father   . HIV Sister   . Diabetes Brother   . Heart disease Brother   . Hyperlipidemia Brother     Social History Social History   Tobacco Use  . Smoking status: Current Every Day Smoker    Packs/day: 0.50    Years: 15.00  Pack years: 7.50    Types: Cigarettes  . Smokeless tobacco: Never Used  Substance Use Topics  . Alcohol use: Yes    Alcohol/week: 0.0 oz    Comment: occasionaly  . Drug use: No     Allergies   Amiodarone; Bee venom; Mexiletine; Cortisone; and Robaxin [methocarbamol]   Review of Systems Review of Systems  HENT: Positive for facial swelling.   Cardiovascular: Positive for palpitations.  Neurological: Positive for syncope and headaches.  All other systems reviewed and are negative.    Physical Exam Updated Vital Signs BP 104/77   Pulse 78   Temp 98.2 F (36.8 C)   Resp 17   Ht 5\' 6"  (1.676 m)   Wt 95.3 kg (210 lb)   SpO2 99%   BMI 33.89 kg/m   Physical Exam  Constitutional: She is oriented to person, place, and time. She appears well-developed and well-nourished. No distress.  HENT:  Head: Normocephalic. Head is with contusion (Around right eye).  Right Ear: Hearing normal.  Left Ear: Hearing normal.  Nose: Nose normal.  Mouth/Throat: Oropharynx is clear and moist and mucous membranes are normal.  Eyes: Pupils are equal, round, and reactive to light. Conjunctivae and EOM are normal.  Neck: Normal range of motion. Neck supple.  Cardiovascular: Regular rhythm, S1 normal and S2 normal. Exam reveals no gallop and no friction rub.  No murmur heard. Pulmonary/Chest: Effort  normal and breath sounds normal. No respiratory distress. She exhibits no tenderness.  Abdominal: Soft. Normal appearance and bowel sounds are normal. There is no hepatosplenomegaly. There is no tenderness. There is no rebound, no guarding, no tenderness at McBurney's point and negative Murphy's sign. No hernia.  Musculoskeletal: Normal range of motion.       Right elbow: She exhibits normal range of motion, no swelling, no effusion, no deformity and no laceration. Tenderness found.       Legs: Neurological: She is alert and oriented to person, place, and time. She has normal strength. No cranial nerve deficit or sensory deficit. Coordination normal. GCS eye subscore is 4. GCS verbal subscore is 5. GCS motor subscore is 6.  Skin: Skin is warm and dry. Abrasion (Knees) noted. No rash noted. No cyanosis.  Psychiatric: She has a normal mood and affect. Her speech is normal and behavior is normal. Thought content normal.  Nursing note and vitals reviewed.    ED Treatments / Results  Labs (all labs ordered are listed, but only abnormal results are displayed) Labs Reviewed  CBC WITH DIFFERENTIAL/PLATELET - Abnormal; Notable for the following components:      Result Value   Hemoglobin 11.8 (*)    All other components within normal limits  BASIC METABOLIC PANEL - Abnormal; Notable for the following components:   Potassium 2.5 (*)    Chloride 100 (*)    Glucose, Bld 136 (*)    All other components within normal limits  MAGNESIUM - Abnormal; Notable for the following components:   Magnesium 1.6 (*)    All other components within normal limits  BRAIN NATRIURETIC PEPTIDE  I-STAT TROPONIN, ED    EKG EKG Interpretation  Date/Time:  Friday Dec 25 2017 00:16:57 EDT Ventricular Rate:  82 PR Interval:    QRS Duration: 141 QT Interval:  434 QTC Calculation: 507 R Axis:   77 Text Interpretation:  Sinus rhythm Probable left atrial enlargement LVH with secondary repolarization abnormality  Borderline prolonged QT interval No significant change since last tracing Confirmed by Orpah Greek (  [id number]) on 12/25/2017 12:24:58 AM   Radiology No results found.  Procedures Procedures (including critical care time)  Medications Ordered in ED Medications  potassium chloride 10 mEq in 100 mL IVPB (10 mEq Intravenous New Bag/Given 12/25/17 0108)  magnesium sulfate IVPB 2 g 50 mL (has no administration in time range)  oxyCODONE-acetaminophen (PERCOCET/ROXICET) 5-325 MG per tablet 1 tablet (1 tablet Oral Given 12/25/17 0018)  potassium chloride SA (K-DUR,KLOR-CON) CR tablet 40 mEq (40 mEq Oral Given 12/25/17 0107)     Initial Impression / Assessment and Plan / ED Course  I have reviewed the triage vital signs and the nursing notes.  Pertinent labs & imaging results that were available during my care of the patient were reviewed by me and considered in my medical decision making (see chart for details).     Patient presents to the emergency department for evaluation after syncopal episode.  Patient reports that she suddenly passed out after noting that she was feeling palpitations in her chest.  She has a history of V. tach with an implanted defibrillator.  Patient's defibrillator was interrogated.  She has had a reported V. fib rhythm at 273 bpm that lasted 17 seconds before the defibrillator shocked her at 18 J resulting in conversion to sinus rhythm.  Interrogation also reports another event requiring shock in the last week or 2 that she did not recall.  Discussed with Dr. Arab Lions, on-call for cardiology at [facility].  He did not feel that she required transport to code tonight.  He did not recommend any empiric treatment at this time.  Does recommend lidocaine if she has any significant signs of ectopy or nonsustained V. Tach (she has a reported allergy to amiodarone).  Agrees with correction of electrolytes.  Cardiology consult tomorrow.  Final Clinical Impressions(s) / ED  Diagnoses   Final diagnoses:  Syncope, cardiogenic    ED Discharge Orders    None       Pollina, Gwenyth Allegra, MD 12/25/17 [phone]

## 2017-12-25 NOTE — ED Notes (Signed)
Per medtronic pt had a v-fib rhythm at 273 for 17 seconds on 5/9 at 2245. Pt's defibrillator shocked her at 35 joules which corrected rhythm. Company states pt has had 2 shocks in the last 2 weeks.

## 2017-12-25 NOTE — Progress Notes (Signed)
1642 d/c instructions & paperwork given to patient. IV catheter removed from patient's RIGHT AC, intact w/no s/s of infection/infiltration noted at this time. ICU monitor wires removed from patient and patient able to dress herself. Patient gathered her personal belongings and reported that she wasn't missing anything at this time. Patient's daughter arrived at 65 to transport patient home. Patient aware to pick up Rxs from [facility].

## 2017-12-25 NOTE — Consult Note (Addendum)
Cardiology Consultation:   Patient ID: [name]; [id number]; [birth year]   Admit date: 12/24/2017 Date of Consult: 12/25/2017  Primary Care Provider: Fayrene Helper, MD Primary Cardiologist: [facility] Primary Electrophysiologist:  Caryl Comes   Patient Profile:   [name] is a 52 y.o. female with a hx of CAD, chronic systolic HF LVEF 63-87%, VT with ICD, who is being seen today for the evaluation of syncope at the request of Dr Manuella Ghazi.  History of Present Illness:   [name] 52 yo female history of CAD with stent to RCA, chronic systolic HF, history of VT now with ICD followed by Dr Caryl Comes, SLE. Followed closely by CHF clinic with considerations for possible advanced therapies if worsening of her symptoms.   From last EP visit 11/2017 episodes of VT that were sustained below detection rate noted in 06/2017, also some VT with succesfully ATP noted. Intolerant to [employer] and mexilitene. 12/09/17 device check reported normal function.   Presents with syncope at home. Had been feeling well over the last few weeks. Sudden episode of palpitations while sitting at her compute and loss of consciousness. Denies any chest pain, no SOB, no DOE, no LE edema. Compliant with meds   WBC 5.4 Hgb 11.8 Plt 257 K 2.5 Cr 0.84 BUN 8 BNP 113 Mg 1.6 Trop neg-->0.05--> CXR no acute process CT head no acute process 03/2017 echo LVEF 20-25%, grade II diastolic dysfunction,mod MR, mild RV dysfunction, PASP 26 Cath 2007 moderate nonobstructive disease Myoview 2018 large inferior scar, no ischemia.   Wt last outpt visit 12/09/17 205 lbs.    Device interrogated in ER,  episode of VF at 273 lasting 17 seconds followed by ICD shock that was succesul. Past Medical History:  Diagnosis Date  . Arthritis of knee   . Automatic implantable cardiac defibrillator in situ    a. s/p prior Medtronic ICD with 6949 lead. b. s/p Gen change & lead revision 05/2013.  . Cardiomyopathy secondary    Patient  has cardiomyopathy out of proportion to her ischemic heart disease  . coronary artery disease    RCA stenting Myoview 2011 EF 30% infarction dilatation without ischemia  . Depression   . Diabetes mellitus   . GERD (gastroesophageal reflux disease)   . Insomnia   . Lupus (systemic lupus erythematosus) ([place])   . Migraines   . Myocardial infarction ([place])    8 total   . Nicotine abuse   . Other and unspecified hyperlipidemia   . Paroxysmal VT ([place])   . Systolic CHF ([place])   . VF (ventricular fibrillation) ([place])    a. Hx appropriate ICD therapy for VF.    Past Surgical History:  Procedure Laterality Date  . ABDOMINAL HYSTERECTOMY    . CHOLECYSTECTOMY    . COLONOSCOPY  07/15/2011   SLF: internal hemorrhoids/hyperplastic polyps in the rectum/tubular adenomaSURVEILLANCE Nov 2017  . defibrillator placed   2009 and 05/2013  . ICD GENERATOR CHANGE  06/17/2013   Dr Caryl Comes  . IMPLANTABLE CARDIOVERTER DEFIBRILLATOR (ICD) GENERATOR CHANGE Left 06/17/2013   Procedure: ICD GENERATOR CHANGE;  Surgeon: Deboraha Sprang, MD;  Location: [facility] CATH LAB;  Service: Cardiovascular;  Laterality: Left;  . LEAD REVISION N/A 06/17/2013   Procedure: LEAD REVISION;  Surgeon: Deboraha Sprang, MD;  Location: [facility] CATH LAB;  Service: Cardiovascular;  Laterality: N/A;  . ROTATOR CUFF REPAIR Right 2002     Inpatient Medications: Scheduled Meds: . aspirin EC  81 mg Oral Daily  . atorvastatin  10  mg Oral Daily  . carvedilol  12.5 mg Oral BID  . clopidogrel  75 mg Oral Daily  . cyclobenzaprine  5 mg Oral QHS  . fenofibrate  160 mg Oral Daily  . furosemide  40 mg Oral Daily  . losartan  25 mg Oral Daily  . mometasone-formoterol  2 puff Inhalation BID  . montelukast  10 mg Oral QHS  . oxyCODONE  30 mg Oral BID  . pantoprazole  40 mg Oral Daily  . potassium chloride SA  40 mEq Oral BID  . spironolactone  12.5 mg Oral Daily  . terbinafine  250 mg Oral Daily   Continuous Infusions:  PRN Meds: ALPRAZolam,  ketorolac, oxyCODONE  Allergies:    Allergies  Allergen Reactions  . Amiodarone     Headache and facial swelling  . Bee Venom Anaphylaxis  . Mexiletine   . Cortisone Swelling  . Robaxin [Methocarbamol] Hives and Itching    Social History:   Social History   Socioeconomic History  . Marital status: Single    Spouse name: Not on file  . Number of children: 3  . Years of education: Not on file  . Highest education level: Not on file  Occupational History  . Occupation: disabled    Fish farm manager: UNEMPLOYED  Social Needs  . Financial resource strain: Not on file  . Food insecurity:    Worry: Not on file    Inability: Not on file  . Transportation needs:    Medical: Not on file    Non-medical: Not on file  Tobacco Use  . Smoking status: Current Every Day Smoker    Packs/day: 0.50    Years: 15.00    Pack years: 7.50    Types: Cigarettes  . Smokeless tobacco: Never Used  Substance and Sexual Activity  . Alcohol use: Yes    Alcohol/week: 0.0 oz    Comment: occasionaly  . Drug use: No  . Sexual activity: Not Currently    Birth control/protection: Surgical  Lifestyle  . Physical activity:    Days per week: Not on file    Minutes per session: Not on file  . Stress: Not on file  Relationships  . Social connections:    Talks on phone: Not on file    Gets together: Not on file    Attends religious service: Not on file    Active member of club or organization: Not on file    Attends meetings of clubs or organizations: Not on file    Relationship status: Not on file  . Intimate partner violence:    Fear of current or ex partner: Not on file    Emotionally abused: Not on file    Physically abused: Not on file    Forced sexual activity: Not on file  Other Topics Concern  . Not on file  Social History Narrative  . Not on file    Family History:    Family History  Problem Relation Age of Onset  . Hepatitis Mother 88       HCV  . Liver cancer Mother 52  . Cancer  Mother   . Diabetes Mother   . Heart disease Mother   . Hyperlipidemia Mother   . Stroke Sister 45       x3  . Diabetes Sister   . Hyperlipidemia Sister   . Dementia Father   . HIV Sister   . Diabetes Brother   . Heart disease Brother   . Hyperlipidemia Brother  ROS:  Please see the history of present illness.  All other ROS reviewed and negative.     Physical Exam/Data:   Vitals:   12/25/17 0200 12/25/17 0226 12/25/17 0300 12/25/17 0500  BP: 100/64  105/76   Pulse: 84  78   Resp: 20  (!) 22   Temp:  97.8 F (36.6 C)  (!) 97.5 F (36.4 C)  TempSrc:  Oral  Oral  SpO2: 100%  96%   Weight:  209 lb 3.5 oz (94.9 kg)  209 lb 3.5 oz (94.9 kg)  Height:  5\' 6"  (1.676 m)      Intake/Output Summary (Last 24 hours) at 12/25/2017 1059 Last data filed at 12/25/2017 0310 Gross per 24 hour  Intake 150 ml  Output -  Net 150 ml   Filed Weights   12/24/17 2351 12/25/17 0226 12/25/17 0500  Weight: 210 lb (95.3 kg) 209 lb 3.5 oz (94.9 kg) 209 lb 3.5 oz (94.9 kg)   Body mass index is 33.77 kg/m.  General:  Well nourished, well developed, in no acute distress HEENT: normal Lymph: no adenopathy Neck: no JVD Endocrine:  No thryomegaly Cardiac:  normal S1, S2; RRR; no murmur  Lungs:  clear to auscultation bilaterally, no wheezing, rhonchi or rales  Abd: soft, nontender, no hepatomegaly  Ext: no edema Musculoskeletal:  No deformities, BUE and BLE strength normal and equal Skin: warm and dry  Neuro:  CNs 2-12 intact, no focal abnormalities noted Psych:  Normal affect      Laboratory Data:  Chemistry Recent Labs  Lab 12/25/17 0017 12/25/17 0635  NA 138 137  K 2.5* 3.7  CL 100* 101  CO2 28 29  GLUCOSE 136* 123*  BUN 8 9  CREATININE 0.84 0.77  CALCIUM 9.0 8.8*  GFRNONAA >60 >60  GFRAA >60 >60  ANIONGAP 10 7    Recent Labs  Lab 12/25/17 0635  PROT 7.7  ALBUMIN 3.5  AST 22  ALT 19  ALKPHOS 81  BILITOT 0.8   Hematology Recent Labs  Lab 12/25/17 0017  12/25/17 0635  WBC 5.4 6.4  RBC 4.39 4.46  HGB 11.8* 12.1  HCT 36.7 37.5  MCV 83.6 84.1  MCH 26.9 27.1  MCHC 32.2 32.3  RDW 13.1 13.0  PLT 257 264   Cardiac Enzymes Recent Labs  Lab 12/25/17 0635  TROPONINI 0.05*    Recent Labs  Lab 12/25/17 0020  TROPIPOC 0.03    BNP Recent Labs  Lab 12/25/17 0017  BNP 113.0*    DDimer No results for input(s): DDIMER in the last 168 hours.  Radiology/Studies:  Dg Chest 2 View  Result Date: 12/25/2017 CLINICAL DATA:  52 year old female with fall. Patient is having chest pain. EXAM: CHEST - 2 VIEW COMPARISON:  Chest radiograph dated 06/29/2017 FINDINGS: The lungs are clear. There is no pleural effusion or pneumothorax. Mild cardiomegaly left pectoral AICD device. No acute osseous pathology. IMPRESSION: No active cardiopulmonary disease. Electronically Signed   By: Anner Crete M.D.   On: 12/25/2017 01:27   Dg Elbow Complete Right  Result Date: 12/25/2017 CLINICAL DATA:  52 year old female with fall and right elbow pain. EXAM: RIGHT ELBOW - COMPLETE 3+ VIEW COMPARISON:  None. FINDINGS: There is no evidence of fracture, dislocation, or joint effusion. There is no evidence of arthropathy or other focal bone abnormality. Soft tissues are unremarkable. IMPRESSION: Negative. Electronically Signed   By: Anner Crete M.D.   On: 12/25/2017 01:29   Ct Head Wo Contrast  Result  Date: 12/25/2017 CLINICAL DATA:  52 year old female with fall. EXAM: CT HEAD WITHOUT CONTRAST CT MAXILLOFACIAL WITHOUT CONTRAST CT CERVICAL SPINE WITHOUT CONTRAST TECHNIQUE: Multidetector CT imaging of the head, cervical spine, and maxillofacial structures were performed using the standard protocol without intravenous contrast. Multiplanar CT image reconstructions of the cervical spine and maxillofacial structures were also generated. COMPARISON:  Head CT dated 11/03/2008 FINDINGS: CT HEAD FINDINGS Brain: No evidence of acute infarction, hemorrhage, hydrocephalus,  extra-axial collection or mass lesion/mass effect. Vascular: No hyperdense vessel or unexpected calcification. Skull: Normal. Negative for fracture or focal lesion. Other: Mild right periorbital contusion. CT MAXILLOFACIAL FINDINGS Osseous: No fracture or mandibular dislocation. No destructive process. Orbits: Negative. No traumatic or inflammatory finding. Sinuses: Clear. Soft tissues: Mild right periorbital soft tissue contusion. No hematoma. CT CERVICAL SPINE FINDINGS Alignment: Normal. Skull base and vertebrae: No acute fracture. No primary bone lesion or focal pathologic process. Soft tissues and spinal canal: No prevertebral fluid or swelling. No visible canal hematoma. Disc levels: No acute findings. No significant degenerative changes. Upper chest: Negative. Other: None IMPRESSION: 1. No acute intracranial pathology. 2. No acute facial bone fractures. 3. No acute/traumatic cervical spine pathology. Electronically Signed   By: Anner Crete M.D.   On: 12/25/2017 01:43   Ct Cervical Spine Wo Contrast  Result Date: 12/25/2017 CLINICAL DATA:  52 year old female with fall. EXAM: CT HEAD WITHOUT CONTRAST CT MAXILLOFACIAL WITHOUT CONTRAST CT CERVICAL SPINE WITHOUT CONTRAST TECHNIQUE: Multidetector CT imaging of the head, cervical spine, and maxillofacial structures were performed using the standard protocol without intravenous contrast. Multiplanar CT image reconstructions of the cervical spine and maxillofacial structures were also generated. COMPARISON:  Head CT dated 11/03/2008 FINDINGS: CT HEAD FINDINGS Brain: No evidence of acute infarction, hemorrhage, hydrocephalus, extra-axial collection or mass lesion/mass effect. Vascular: No hyperdense vessel or unexpected calcification. Skull: Normal. Negative for fracture or focal lesion. Other: Mild right periorbital contusion. CT MAXILLOFACIAL FINDINGS Osseous: No fracture or mandibular dislocation. No destructive process. Orbits: Negative. No traumatic or  inflammatory finding. Sinuses: Clear. Soft tissues: Mild right periorbital soft tissue contusion. No hematoma. CT CERVICAL SPINE FINDINGS Alignment: Normal. Skull base and vertebrae: No acute fracture. No primary bone lesion or focal pathologic process. Soft tissues and spinal canal: No prevertebral fluid or swelling. No visible canal hematoma. Disc levels: No acute findings. No significant degenerative changes. Upper chest: Negative. Other: None IMPRESSION: 1. No acute intracranial pathology. 2. No acute facial bone fractures. 3. No acute/traumatic cervical spine pathology. Electronically Signed   By: Anner Crete M.D.   On: 12/25/2017 01:43   Ct Maxillofacial Wo Contrast  Result Date: 12/25/2017 CLINICAL DATA:  52 year old female with fall. EXAM: CT HEAD WITHOUT CONTRAST CT MAXILLOFACIAL WITHOUT CONTRAST CT CERVICAL SPINE WITHOUT CONTRAST TECHNIQUE: Multidetector CT imaging of the head, cervical spine, and maxillofacial structures were performed using the standard protocol without intravenous contrast. Multiplanar CT image reconstructions of the cervical spine and maxillofacial structures were also generated. COMPARISON:  Head CT dated 11/03/2008 FINDINGS: CT HEAD FINDINGS Brain: No evidence of acute infarction, hemorrhage, hydrocephalus, extra-axial collection or mass lesion/mass effect. Vascular: No hyperdense vessel or unexpected calcification. Skull: Normal. Negative for fracture or focal lesion. Other: Mild right periorbital contusion. CT MAXILLOFACIAL FINDINGS Osseous: No fracture or mandibular dislocation. No destructive process. Orbits: Negative. No traumatic or inflammatory finding. Sinuses: Clear. Soft tissues: Mild right periorbital soft tissue contusion. No hematoma. CT CERVICAL SPINE FINDINGS Alignment: Normal. Skull base and vertebrae: No acute fracture. No primary bone lesion or focal pathologic  process. Soft tissues and spinal canal: No prevertebral fluid or swelling. No visible canal  hematoma. Disc levels: No acute findings. No significant degenerative changes. Upper chest: Negative. Other: None IMPRESSION: 1. No acute intracranial pathology. 2. No acute facial bone fractures. 3. No acute/traumatic cervical spine pathology. Electronically Signed   By: Anner Crete M.D.   On: 12/25/2017 01:43    Assessment and Plan:   1. Syncope, VT/VF - episode of syncope at home. Patient with history of VT in setting of chronic systolic HF LVEF 74-08%, has ICD - ICD interrogation shows episode in VF zone at 273, succesful single shock - K 2.5, Mg 1.6. Appears to be the exacerbating factors in setting of her chronic substrate. Had been doing well until this sudden event - keep K at 4, Mg at 2.   - increase home KCl to 34mEq bid, start magnesium oxide 400mg  daily at discharge. Will need BMET/Mg 1 week after discharge    3. Hypokalemia - 2.5 on admission, up to 3.7 today With additional KCl given this morning - change home regimen to 38mEq bid.  4. Hypomagnesemia - start magnesium oxide 400mg  bid at discharge.   5. Chronic systolic HF - appears euvolemic, continue home regimen - chronically soft bp's   Ok for discharge. History of prior VT in setting of chronic systolic HF with clear exacerbating factor with her very low potassium. Adjust KCl and Mg replacement as reported above. We will arrange outpatient f/u. Needs BMET/Mg 1 week, we will arrange an outpatient f/u.  For questions or updates, please contact [place] Please consult [email or web address] for contact info under Cardiology/STEMI.   Merrily Pew, MD  12/25/2017 10:59 AM

## 2017-12-25 NOTE — H&P (Signed)
History and Physical    [name] OHY:[id number] DOB: 1966-01-05 DOA: 12/24/2017  PCP: Fayrene Helper, MD   Patient coming from: Home.  I have personally briefly reviewed patient's old medical records in [place]  Chief Complaint: "heart was racing and passed out"  HPI: [name] is a 52 y.o. female with medical history significant of arthritis of the knee, depression, insomnia, nicotine abuse, GERD, CAD, history of MI x8, chronic systolic CHF, paroxysmal ventricular tachycardia,  history of ventricular fibrillation with ICD therapy who is coming to the emergency department after losing consciousness at home.  Per patient, she was working on homework on her computer.  She mentioned that she has been feeling some palpitations since last month.  However, tonight she felt very intense palpitations briefly, passed out and fell down on her knees and her right side of her face.  The next thing she remembered was hearing her aunt calling her.  She denied chest pain, diaphoresis, nausea, emesis, recent PND, orthopnea or recent pitting edema of the lower extremities.  No abdominal pain, diarrhea, melena or hematochezia.  Denies dysuria, frequency or hematuria.  No polyuria, polydipsia, polyphagia or blurred vision.  No heat or cold intolerance.  Denies pruritus or skin rashes.  ED Course: Initial vital signs temperature 98.2 F, pulse 83, respirations 17, blood pressure 105/76 mmHg and O2 sat 100%.  She received a tablet of Percocet 5/325 mg p.o., potassium and magnesium supplementation.  Her case was discussed with cardiology on-call.  EKG was sinus rhythm with borderline prolonged QT interval, probable left atrial enlargement and LVH.  Please see actual tracing for further detail.  First troponin was negative.Her white count was 5.4 with a normal differential, hemoglobin 11.8 g/dL and platelets 257.  Chemistry showed a sodium of 138, potassium of 2.5, chloride 100 and  CO2 28 mmol/L.  BUN was 8, creatinine 0.84, magnesium 1.6, phosphorus 3.1 and glucose 136 mg/dL.  Imaging: Her chest radiograph did not show any acute cardiopulmonary pathology.  Right elbow, CT head, CT maxillofacial and CT cervical spine (all without contrast) did not show any acute intracranial pathology or fractures.  Please see images and radiology reports for further detail.  Review of Systems: As per HPI otherwise 10 point review of systems negative.   Past Medical History:  Diagnosis Date  . Arthritis of knee   . Automatic implantable cardiac defibrillator in situ    a. s/p prior Medtronic ICD with 6949 lead. b. s/p Gen change & lead revision 05/2013.  . Cardiomyopathy secondary    Patient has cardiomyopathy out of proportion to her ischemic heart disease  . coronary artery disease    RCA stenting Myoview 2011 EF 30% infarction dilatation without ischemia  . Depression   . Diabetes mellitus   . GERD (gastroesophageal reflux disease)   . Insomnia   . Lupus (systemic lupus erythematosus) ([place])   . Migraines   . Myocardial infarction ([place])    8 total   . Nicotine abuse   . Other and unspecified hyperlipidemia   . Paroxysmal VT ([place])   . Systolic CHF ([place])   . VF (ventricular fibrillation) ([place])    a. Hx appropriate ICD therapy for VF.    Past Surgical History:  Procedure Laterality Date  . ABDOMINAL HYSTERECTOMY    . CHOLECYSTECTOMY    . COLONOSCOPY  07/15/2011   SLF: internal hemorrhoids/hyperplastic polyps in the rectum/tubular adenomaSURVEILLANCE Nov 2017  . defibrillator placed   2009 and 05/2013  .  ICD GENERATOR CHANGE  06/17/2013   Dr Caryl Comes  . IMPLANTABLE CARDIOVERTER DEFIBRILLATOR (ICD) GENERATOR CHANGE Left 06/17/2013   Procedure: ICD GENERATOR CHANGE;  Surgeon: Deboraha Sprang, MD;  Location: [facility] CATH LAB;  Service: Cardiovascular;  Laterality: Left;  . LEAD REVISION N/A 06/17/2013   Procedure: LEAD REVISION;  Surgeon: Deboraha Sprang, MD;  Location: [facility] CATH LAB;   Service: Cardiovascular;  Laterality: N/A;  . ROTATOR CUFF REPAIR Right 2002     reports that she has been smoking cigarettes.  She has a 7.50 pack-year smoking history. She has never used smokeless tobacco. She reports that she drinks alcohol. She reports that she does not use drugs.  Allergies  Allergen Reactions  . Amiodarone     Headache and facial swelling  . Bee Venom Anaphylaxis  . Mexiletine   . Cortisone Swelling  . Robaxin [Methocarbamol] Hives and Itching    Family History  Problem Relation Age of Onset  . Hepatitis Mother 33       HCV  . Liver cancer Mother 46  . Cancer Mother   . Diabetes Mother   . Heart disease Mother   . Hyperlipidemia Mother   . Stroke Sister 45       x3  . Diabetes Sister   . Hyperlipidemia Sister   . Dementia Father   . HIV Sister   . Diabetes Brother   . Heart disease Brother   . Hyperlipidemia Brother    Prior to Admission medications   Medication Sig Start Date End Date Taking? Authorizing Provider  ALPRAZolam Duanne Moron) 1 MG tablet One tablet at bedtime Patient taking differently: 1 mg at bedtime as needed for sleep. One tablet at bedtime 11/04/17  Yes Fayrene Helper, MD  aspirin 81 MG tablet Take 1 tablet (81 mg total) by mouth daily. 09/26/13  Yes Deboraha Sprang, MD  atorvastatin (LIPITOR) 10 MG tablet Take 1 tablet (10 mg total) by mouth daily. 07/20/17  Yes Deboraha Sprang, MD  budesonide-formoterol [facility]) 80-4.5 MCG/ACT inhaler Inhale 2 puffs into the lungs 2 (two) times daily. 04/02/16  Yes Fayrene Helper, MD  carvedilol (COREG) 12.5 MG tablet Take 1 tablet (12.5 mg total) by mouth 2 (two) times daily. 12/21/17 03/21/18 Yes Bensimhon, Shaune Pascal, MD  clopidogrel (PLAVIX) 75 MG tablet Take 1 tablet (75 mg total) by mouth daily. 11/04/17  Yes Fayrene Helper, MD  cyclobenzaprine (FLEXERIL) 5 MG tablet Take 1 tablet (5 mg total) by mouth at bedtime. 11/04/17  Yes Fayrene Helper, MD  fenofibrate (TRICOR) 145 MG tablet  Take 145 mg by mouth daily.  04/15/13  Yes [provider]  furosemide (LASIX) 20 MG tablet Take 2 tablets (40 mg total) by mouth daily. 11/11/17  Yes Bensimhon, Shaune Pascal, MD  losartan (COZAAR) 25 MG tablet Take 1 tablet (25 mg total) by mouth daily. 07/20/17 12/10/18 Yes Deboraha Sprang, MD  montelukast (SINGULAIR) 10 MG tablet Take 1 tablet (10 mg total) by mouth at bedtime. 11/04/17  Yes Fayrene Helper, MD  oxyCODONE (OXYCONTIN) 30 MG 12 hr tablet Take by mouth.   Yes [provider]  potassium chloride SA (K-DUR,KLOR-CON) 20 MEQ tablet Take 40 mEq by mouth 2 (two) times daily.    Yes [provider]  spironolactone (ALDACTONE) 25 MG tablet Take 0.5 tablets (12.5 mg total) by mouth daily. 07/24/17 12/10/18 Yes Bensimhon, Shaune Pascal, MD  terbinafine (LAMISIL) 250 MG tablet Take 1 tablet (250 mg total) by mouth  daily. 11/04/17  Yes Fayrene Helper, MD  albuterol (PROVENTIL HFA;VENTOLIN HFA) 108 (90 Base) MCG/ACT inhaler Inhale 2 puffs into the lungs every 6 (six) hours as needed for wheezing or shortness of breath. 03/13/16   Fayrene Helper, MD    Physical Exam: Vitals:   12/25/17 0130 12/25/17 0200 12/25/17 0226 12/25/17 0300  BP: 100/74 100/64  105/76  Pulse: 86 84  78  Resp: 14 20  (!) 22  Temp:   97.8 F (36.6 C)   TempSrc:   Oral   SpO2: 98% 100%  96%  Weight:   94.9 kg (209 lb 3.5 oz)   Height:   5\' 6"  (1.676 m)     Constitutional: NAD, calm, comfortable Eyes: PERRL, lids and conjunctivae normal ENMT: Mucous membranes are moist. Posterior pharynx clear of any exudate or lesions. Neck: normal, supple, no masses, no thyromegaly Respiratory: clear to auscultation bilaterally, no wheezing, no crackles. Normal respiratory effort. No accessory muscle use.  Cardiovascular: Regular rate and rhythm, no murmurs / rubs / gallops. No extremity edema. 2+ pedal pulses. No carotid bruits.  Abdomen: Bowel sounds positive. Obese, soft, no tenderness, no masses  palpated. No hepatosplenomegaly.  Musculoskeletal: no clubbing / cyanosis.    No gross joint deformity upper and lower extremities. Positive right elbow tenderness, no contractures. Normal muscle tone.  Skin: Positive edema, tenderness with mild ecchymosis and erythema on right facial/periorbital area. Neurologic: CN 2-12 grossly intact. Sensation intact, DTR normal. Strength 5/5 in all 4.  Psychiatric: Normal judgment and insight. Alert and oriented x 3. Normal mood.    Labs on Admission: I have personally reviewed following labs and imaging studies  CBC: Recent Labs  Lab 12/25/17 0017  WBC 5.4  NEUTROABS 2.8  HGB 11.8*  HCT 36.7  MCV 83.6  PLT 016   Basic Metabolic Panel: Recent Labs  Lab 12/25/17 0017  NA 138  K 2.5*  CL 100*  CO2 28  GLUCOSE 136*  BUN 8  CREATININE 0.84  CALCIUM 9.0  MG 1.6*  PHOS 3.1   GFR: Estimated Creatinine Clearance: 89.9 mL/min (by C-G formula based on SCr of 0.84 mg/dL). Liver Function Tests: No results for input(s): AST, ALT, ALKPHOS, BILITOT, PROT, ALBUMIN in the last 168 hours. No results for input(s): LIPASE, AMYLASE in the last 168 hours. No results for input(s): AMMONIA in the last 168 hours. Coagulation Profile: No results for input(s): INR, PROTIME in the last 168 hours. Cardiac Enzymes: No results for input(s): CKTOTAL, CKMB, CKMBINDEX, TROPONINI in the last 168 hours. BNP (last 3 results) No results for input(s): PROBNP in the last 8760 hours. HbA1C: No results for input(s): HGBA1C in the last 72 hours. CBG: No results for input(s): GLUCAP in the last 168 hours. Lipid Profile: No results for input(s): CHOL, HDL, LDLCALC, TRIG, CHOLHDL, LDLDIRECT in the last 72 hours. Thyroid Function Tests: No results for input(s): TSH, T4TOTAL, FREET4, T3FREE, THYROIDAB in the last 72 hours. Anemia Panel: No results for input(s): VITAMINB12, FOLATE, FERRITIN, TIBC, IRON, RETICCTPCT in the last 72 hours. Urine analysis:    Component  Value Date/Time   COLORURINE YELLOW 12/05/2016 0316   APPEARANCEUR CLEAR 12/05/2016 0316   LABSPEC 1.005 12/05/2016 0316   PHURINE 6.0 12/05/2016 0316   GLUCOSEU NEGATIVE 12/05/2016 0316   HGBUR SMALL (A) 12/05/2016 0316   HGBUR large 08/07/2010 0901   BILIRUBINUR NEGATIVE 12/05/2016 0316   BILIRUBINUR neg 07/31/2016 [place] 12/05/2016 0316   PROTEINUR NEGATIVE 12/05/2016 0316  UROBILINOGEN 1.0 07/31/2016 0854   UROBILINOGEN 0.2 09/13/2012 2220   NITRITE NEGATIVE 12/05/2016 0316   LEUKOCYTESUR TRACE (A) 12/05/2016 0316    Radiological Exams on Admission: Dg Chest 2 View  Result Date: 12/25/2017 CLINICAL DATA:  52 year old female with fall. Patient is having chest pain. EXAM: CHEST - 2 VIEW COMPARISON:  Chest radiograph dated 06/29/2017 FINDINGS: The lungs are clear. There is no pleural effusion or pneumothorax. Mild cardiomegaly left pectoral AICD device. No acute osseous pathology. IMPRESSION: No active cardiopulmonary disease. Electronically Signed   By: Anner Crete M.D.   On: 12/25/2017 01:27   Dg Elbow Complete Right  Result Date: 12/25/2017 CLINICAL DATA:  52 year old female with fall and right elbow pain. EXAM: RIGHT ELBOW - COMPLETE 3+ VIEW COMPARISON:  None. FINDINGS: There is no evidence of fracture, dislocation, or joint effusion. There is no evidence of arthropathy or other focal bone abnormality. Soft tissues are unremarkable. IMPRESSION: Negative. Electronically Signed   By: Anner Crete M.D.   On: 12/25/2017 01:29   Ct Head Wo Contrast  Result Date: 12/25/2017 CLINICAL DATA:  52 year old female with fall. EXAM: CT HEAD WITHOUT CONTRAST CT MAXILLOFACIAL WITHOUT CONTRAST CT CERVICAL SPINE WITHOUT CONTRAST TECHNIQUE: Multidetector CT imaging of the head, cervical spine, and maxillofacial structures were performed using the standard protocol without intravenous contrast. Multiplanar CT image reconstructions of the cervical spine and maxillofacial  structures were also generated. COMPARISON:  Head CT dated 11/03/2008 FINDINGS: CT HEAD FINDINGS Brain: No evidence of acute infarction, hemorrhage, hydrocephalus, extra-axial collection or mass lesion/mass effect. Vascular: No hyperdense vessel or unexpected calcification. Skull: Normal. Negative for fracture or focal lesion. Other: Mild right periorbital contusion. CT MAXILLOFACIAL FINDINGS Osseous: No fracture or mandibular dislocation. No destructive process. Orbits: Negative. No traumatic or inflammatory finding. Sinuses: Clear. Soft tissues: Mild right periorbital soft tissue contusion. No hematoma. CT CERVICAL SPINE FINDINGS Alignment: Normal. Skull base and vertebrae: No acute fracture. No primary bone lesion or focal pathologic process. Soft tissues and spinal canal: No prevertebral fluid or swelling. No visible canal hematoma. Disc levels: No acute findings. No significant degenerative changes. Upper chest: Negative. Other: None IMPRESSION: 1. No acute intracranial pathology. 2. No acute facial bone fractures. 3. No acute/traumatic cervical spine pathology. Electronically Signed   By: Anner Crete M.D.   On: 12/25/2017 01:43   Ct Cervical Spine Wo Contrast  Result Date: 12/25/2017 CLINICAL DATA:  52 year old female with fall. EXAM: CT HEAD WITHOUT CONTRAST CT MAXILLOFACIAL WITHOUT CONTRAST CT CERVICAL SPINE WITHOUT CONTRAST TECHNIQUE: Multidetector CT imaging of the head, cervical spine, and maxillofacial structures were performed using the standard protocol without intravenous contrast. Multiplanar CT image reconstructions of the cervical spine and maxillofacial structures were also generated. COMPARISON:  Head CT dated 11/03/2008 FINDINGS: CT HEAD FINDINGS Brain: No evidence of acute infarction, hemorrhage, hydrocephalus, extra-axial collection or mass lesion/mass effect. Vascular: No hyperdense vessel or unexpected calcification. Skull: Normal. Negative for fracture or focal lesion. Other: Mild  right periorbital contusion. CT MAXILLOFACIAL FINDINGS Osseous: No fracture or mandibular dislocation. No destructive process. Orbits: Negative. No traumatic or inflammatory finding. Sinuses: Clear. Soft tissues: Mild right periorbital soft tissue contusion. No hematoma. CT CERVICAL SPINE FINDINGS Alignment: Normal. Skull base and vertebrae: No acute fracture. No primary bone lesion or focal pathologic process. Soft tissues and spinal canal: No prevertebral fluid or swelling. No visible canal hematoma. Disc levels: No acute findings. No significant degenerative changes. Upper chest: Negative. Other: None IMPRESSION: 1. No acute intracranial pathology. 2. No acute facial  bone fractures. 3. No acute/traumatic cervical spine pathology. Electronically Signed   By: Anner Crete M.D.   On: 12/25/2017 01:43   Ct Maxillofacial Wo Contrast  Result Date: 12/25/2017 CLINICAL DATA:  52 year old female with fall. EXAM: CT HEAD WITHOUT CONTRAST CT MAXILLOFACIAL WITHOUT CONTRAST CT CERVICAL SPINE WITHOUT CONTRAST TECHNIQUE: Multidetector CT imaging of the head, cervical spine, and maxillofacial structures were performed using the standard protocol without intravenous contrast. Multiplanar CT image reconstructions of the cervical spine and maxillofacial structures were also generated. COMPARISON:  Head CT dated 11/03/2008 FINDINGS: CT HEAD FINDINGS Brain: No evidence of acute infarction, hemorrhage, hydrocephalus, extra-axial collection or mass lesion/mass effect. Vascular: No hyperdense vessel or unexpected calcification. Skull: Normal. Negative for fracture or focal lesion. Other: Mild right periorbital contusion. CT MAXILLOFACIAL FINDINGS Osseous: No fracture or mandibular dislocation. No destructive process. Orbits: Negative. No traumatic or inflammatory finding. Sinuses: Clear. Soft tissues: Mild right periorbital soft tissue contusion. No hematoma. CT CERVICAL SPINE FINDINGS Alignment: Normal. Skull base and  vertebrae: No acute fracture. No primary bone lesion or focal pathologic process. Soft tissues and spinal canal: No prevertebral fluid or swelling. No visible canal hematoma. Disc levels: No acute findings. No significant degenerative changes. Upper chest: Negative. Other: None IMPRESSION: 1. No acute intracranial pathology. 2. No acute facial bone fractures. 3. No acute/traumatic cervical spine pathology. Electronically Signed   By: Anner Crete M.D.   On: 12/25/2017 01:43    EKG: Independently reviewed.  Vent. rate 82 BPM PR interval * ms QRS duration 141 ms QT/QTc 434/507 ms P-R-T axes 54 77 259 Sinus rhythm Probable left atrial enlargement LVH with secondary repolarization abnormality Borderline prolonged QT interval  Assessment/Plan Principal Problem:   Syncope Suspect ventricular arrhythmia. Observation/stepdown. Close cardiac monitoring. Continue electrolyte replacement. Check echocardiogram. Consult cardiology in the morning.  Active Problems:   Ventricular tachycardia (paroxysmal) ([place]) See syncope treatment plan. Continue  electrolyte replacement. Continue beta-blocker. The patient is allergic to amiodarone. Start lidocaine infusion if the patient develops V. tach.    Diabetes mellitus type 2 with complications (HCC) Carbohydrate modified diet. CBG monitoring with regular insulin sliding scale while in the hospital.    Hyperlipidemia LDL goal <100 Continue atorvastatin 10 mg p.o. daily. Check LFTs as needed.    GERD (gastroesophageal reflux disease) Protonix 40 mg p.o. daily.    Systolic CHF ([place]) No signs of decompensation at this time. Continue carvedilol 12.5 mg p.o. daily. Continue furosemide 40 mg p.o. daily. Continue losartan 25 mg p.o. daily. Continue Spironolactone 12.5 mg p.o. daily. Monitor heart rate, renal function and electrolytes.    Hypokalemia Replacing. Increase potassium supplementation from 20 mEq twice daily to 40 mEq twice  daily.    Hypomagnesemia Replacing. Follow-up magnesium level as needed. I advised the patient to take a daily magnesium supplement.    DVT prophylaxis: SCDs. Code Status: Full code. Family Communication:  Disposition Plan: Observation in SDU for syncope work-up and electrolyte replacement 24 to 48 hours. Consults called: Routine cardiology consult. Admission status: Observation/telemetry.   Reubin Milan MD [facility] Pager [phone].  If 7PM-7AM, please contact night-coverage [email or web address] Password [email or web address]  12/25/2017, 3:30 AM

## 2017-12-25 NOTE — Progress Notes (Signed)
Lab called critical trop of 0.05. Previous was 0.03. MD notified via text page at [phone]

## 2017-12-28 ENCOUNTER — Telehealth: Payer: Self-pay

## 2017-12-28 NOTE — Telephone Encounter (Signed)
Transition Care Management Follow-up Telephone Call   Date discharged?  12/25/17              How have you been since you were released from the hospital? Tired but fine   Do you understand why you were in the hospital? Yes, I passed out    Do you understand the discharge instructions? yes   Where were you discharged to? home   Items Reviewed:  Medications reviewed: yes  Allergies reviewed: yes  Dietary changes reviewed: yes  Referrals reviewed: no new referrals   Functional Questionnaire:   Activities of Daily Living (ADLs):  able to perform by herself    Any transportation issues/concerns?: Her heart doctor won't let her drive, so her daughter is taking her to all of her appts.   Any patient concerns? no. Doesn't know why potassium and magnesium dropped so low. Has follow up with heart doctor on the 21st of May   Confirmed importance and date/time of follow-up visits scheduled will send a message to the front staff to schedule appt and call pt with date and time     Confirmed with patient if condition begins to worsen call PCP or go to the ER.  Patient was given the office number and encouraged to call back with question or concerns.  yes with verbal understanding

## 2018-01-01 ENCOUNTER — Encounter (HOSPITAL_COMMUNITY): Payer: Self-pay | Admitting: Cardiovascular Disease

## 2018-01-01 ENCOUNTER — Other Ambulatory Visit (HOSPITAL_COMMUNITY)
Admission: RE | Admit: 2018-01-01 | Discharge: 2018-01-01 | Disposition: A | Discharge status: Home / Self Care | Payer: Medicare Other | Source: Ambulatory Visit | Attending: Cardiology | Admitting: Cardiology

## 2018-01-01 ENCOUNTER — Other Ambulatory Visit: Payer: Self-pay

## 2018-01-01 ENCOUNTER — Observation Stay (HOSPITAL_COMMUNITY)
Admission: EM | Admit: 2018-01-01 | Discharge: 2018-01-02 | Disposition: A | Discharge status: Home / Self Care | Payer: Medicare Other | Attending: Internal Medicine | Admitting: Internal Medicine

## 2018-01-01 ENCOUNTER — Encounter (HOSPITAL_COMMUNITY): Payer: Self-pay | Admitting: Emergency Medicine

## 2018-01-01 DIAGNOSIS — F17210 Nicotine dependence, cigarettes, uncomplicated: Secondary | ICD-10-CM | POA: Insufficient documentation

## 2018-01-01 DIAGNOSIS — Z7902 Long term (current) use of antithrombotics/antiplatelets: Secondary | ICD-10-CM | POA: Insufficient documentation

## 2018-01-01 DIAGNOSIS — E559 Vitamin D deficiency, unspecified: Secondary | ICD-10-CM | POA: Diagnosis not present

## 2018-01-01 DIAGNOSIS — E119 Type 2 diabetes mellitus without complications: Secondary | ICD-10-CM | POA: Diagnosis not present

## 2018-01-01 DIAGNOSIS — Z91030 Bee allergy status: Secondary | ICD-10-CM | POA: Insufficient documentation

## 2018-01-01 DIAGNOSIS — E875 Hyperkalemia: Principal | ICD-10-CM | POA: Insufficient documentation

## 2018-01-01 DIAGNOSIS — I10 Essential (primary) hypertension: Secondary | ICD-10-CM | POA: Diagnosis present

## 2018-01-01 DIAGNOSIS — E785 Hyperlipidemia, unspecified: Secondary | ICD-10-CM | POA: Diagnosis not present

## 2018-01-01 DIAGNOSIS — I110 Hypertensive heart disease with heart failure: Secondary | ICD-10-CM | POA: Insufficient documentation

## 2018-01-01 DIAGNOSIS — G4700 Insomnia, unspecified: Secondary | ICD-10-CM | POA: Diagnosis not present

## 2018-01-01 DIAGNOSIS — Z95810 Presence of automatic (implantable) cardiac defibrillator: Secondary | ICD-10-CM | POA: Diagnosis not present

## 2018-01-01 DIAGNOSIS — Z79899 Other long term (current) drug therapy: Secondary | ICD-10-CM | POA: Insufficient documentation

## 2018-01-01 DIAGNOSIS — I472 Ventricular tachycardia: Secondary | ICD-10-CM | POA: Diagnosis not present

## 2018-01-01 DIAGNOSIS — M329 Systemic lupus erythematosus, unspecified: Secondary | ICD-10-CM | POA: Diagnosis not present

## 2018-01-01 DIAGNOSIS — M170 Bilateral primary osteoarthritis of knee: Secondary | ICD-10-CM | POA: Insufficient documentation

## 2018-01-01 DIAGNOSIS — Z955 Presence of coronary angioplasty implant and graft: Secondary | ICD-10-CM | POA: Diagnosis not present

## 2018-01-01 DIAGNOSIS — K760 Fatty (change of) liver, not elsewhere classified: Secondary | ICD-10-CM | POA: Diagnosis not present

## 2018-01-01 DIAGNOSIS — R7302 Impaired glucose tolerance (oral): Secondary | ICD-10-CM | POA: Diagnosis present

## 2018-01-01 DIAGNOSIS — I959 Hypotension, unspecified: Secondary | ICD-10-CM

## 2018-01-01 DIAGNOSIS — I5022 Chronic systolic (congestive) heart failure: Secondary | ICD-10-CM | POA: Insufficient documentation

## 2018-01-01 DIAGNOSIS — Z7982 Long term (current) use of aspirin: Secondary | ICD-10-CM | POA: Insufficient documentation

## 2018-01-01 DIAGNOSIS — F418 Other specified anxiety disorders: Secondary | ICD-10-CM | POA: Diagnosis not present

## 2018-01-01 DIAGNOSIS — I2510 Atherosclerotic heart disease of native coronary artery without angina pectoris: Secondary | ICD-10-CM | POA: Diagnosis not present

## 2018-01-01 DIAGNOSIS — K219 Gastro-esophageal reflux disease without esophagitis: Secondary | ICD-10-CM | POA: Diagnosis not present

## 2018-01-01 DIAGNOSIS — I255 Ischemic cardiomyopathy: Secondary | ICD-10-CM | POA: Diagnosis not present

## 2018-01-01 DIAGNOSIS — I252 Old myocardial infarction: Secondary | ICD-10-CM | POA: Diagnosis not present

## 2018-01-01 DIAGNOSIS — N179 Acute kidney failure, unspecified: Secondary | ICD-10-CM

## 2018-01-01 DIAGNOSIS — Z888 Allergy status to other drugs, medicaments and biological substances status: Secondary | ICD-10-CM | POA: Insufficient documentation

## 2018-01-01 DIAGNOSIS — Z8249 Family history of ischemic heart disease and other diseases of the circulatory system: Secondary | ICD-10-CM | POA: Insufficient documentation

## 2018-01-01 DIAGNOSIS — Z6833 Body mass index (BMI) 33.0-33.9, adult: Secondary | ICD-10-CM | POA: Insufficient documentation

## 2018-01-01 LAB — CBC WITH DIFFERENTIAL/PLATELET
Basophils Absolute: 0.1 10*3/uL (ref 0.0–0.1)
Basophils Relative: 2 %
Eosinophils Absolute: 0.1 10*3/uL (ref 0.0–0.7)
Eosinophils Relative: 1 %
HEMATOCRIT: 44.7 % (ref 36.0–46.0)
HEMOGLOBIN: 14.5 g/dL (ref 12.0–15.0)
LYMPHS ABS: 2.1 10*3/uL (ref 0.7–4.0)
LYMPHS PCT: 37 %
MCH: 27.1 pg (ref 26.0–34.0)
MCHC: 32.4 g/dL (ref 30.0–36.0)
MCV: 83.4 fL (ref 78.0–100.0)
MONOS PCT: 7 %
Monocytes Absolute: 0.4 10*3/uL (ref 0.1–1.0)
NEUTROS PCT: 53 %
Neutro Abs: 3 10*3/uL (ref 1.7–7.7)
Platelets: 329 10*3/uL (ref 150–400)
RBC: 5.36 MIL/uL — ABNORMAL HIGH (ref 3.87–5.11)
RDW: 12.9 % (ref 11.5–15.5)
WBC: 5.7 10*3/uL (ref 4.0–10.5)

## 2018-01-01 LAB — BASIC METABOLIC PANEL
ANION GAP: 8 (ref 5–15)
BUN: 31 mg/dL — ABNORMAL HIGH (ref 6–20)
CO2: 23 mmol/L (ref 22–32)
Calcium: 9.7 mg/dL (ref 8.9–10.3)
Chloride: 98 mmol/L — ABNORMAL LOW (ref 101–111)
Creatinine, Ser: 2.07 mg/dL — ABNORMAL HIGH (ref 0.44–1.00)
GFR, EST AFRICAN AMERICAN: 30 mL/min — AB (ref >60)
GFR, EST NON AFRICAN AMERICAN: 26 mL/min — AB (ref >60)
Glucose, Bld: 140 mg/dL — ABNORMAL HIGH (ref 65–99)
POTASSIUM: 6.5 mmol/L — AB (ref 3.5–5.1)
Sodium: 129 mmol/L — ABNORMAL LOW (ref 135–145)

## 2018-01-01 LAB — RENAL FUNCTION PANEL
ANION GAP: 7 (ref 5–15)
Albumin: 4.4 g/dL (ref 3.5–5.0)
BUN: 34 mg/dL — ABNORMAL HIGH (ref 6–20)
CALCIUM: 9.9 mg/dL (ref 8.9–10.3)
CHLORIDE: 102 mmol/L (ref 101–111)
CO2: 23 mmol/L (ref 22–32)
Creatinine, Ser: 2.59 mg/dL — ABNORMAL HIGH (ref 0.44–1.00)
GFR calc non Af Amer: 20 mL/min — ABNORMAL LOW (ref >60)
GFR, EST AFRICAN AMERICAN: 23 mL/min — AB (ref >60)
Glucose, Bld: 68 mg/dL (ref 65–99)
Phosphorus: 5 mg/dL — ABNORMAL HIGH (ref 2.5–4.6)
Potassium: >7.5 mmol/L (ref 3.5–5.1)
SODIUM: 132 mmol/L — AB (ref 135–145)

## 2018-01-01 LAB — POTASSIUM: Potassium: 5.8 mmol/L — ABNORMAL HIGH (ref 3.5–5.1)

## 2018-01-01 LAB — MAGNESIUM
Magnesium: 2.7 mg/dL — ABNORMAL HIGH (ref 1.7–2.4)
Magnesium: 2.9 mg/dL — ABNORMAL HIGH (ref 1.7–2.4)

## 2018-01-01 MED ORDER — DEXTROSE 50 % IV SOLN
25.0000 g | Freq: Once | INTRAVENOUS | Status: AC
  Administered 2018-01-01: 25 g via INTRAVENOUS

## 2018-01-01 MED ORDER — ONDANSETRON HCL 4 MG PO TABS: 4.0000 mg | ORAL_TABLET | Freq: Four times a day (QID) | ORAL | Status: DC | PRN

## 2018-01-01 MED ORDER — SODIUM POLYSTYRENE SULFONATE 15 GM/60ML PO SUSP
30.0000 g | Freq: Once | ORAL | Status: AC
  Administered 2018-01-01: 30 g via ORAL
  Filled 2018-01-01: qty 120

## 2018-01-01 MED ORDER — ACETAMINOPHEN 650 MG RE SUPP: 650.0000 mg | Freq: Four times a day (QID) | RECTAL | Status: DC | PRN

## 2018-01-01 MED ORDER — DEXTROSE 50 % IV SOLN
INTRAVENOUS | Status: AC
  Filled 2018-01-01: qty 50

## 2018-01-01 MED ORDER — INSULIN ASPART 100 UNIT/ML ~~LOC~~ SOLN
SUBCUTANEOUS | Status: AC
  Filled 2018-01-01: qty 1

## 2018-01-01 MED ORDER — SODIUM BICARBONATE 8.4 % IV SOLN
INTRAVENOUS | Status: AC
  Filled 2018-01-01: qty 100

## 2018-01-01 MED ORDER — SODIUM BICARBONATE 8.4 % IV SOLN
Freq: Once | INTRAVENOUS | Status: AC
  Administered 2018-01-01: 21:00:00 via INTRAVENOUS
  Filled 2018-01-01: qty 100

## 2018-01-01 MED ORDER — INSULIN ASPART 100 UNIT/ML IV SOLN
10.0000 [IU] | Freq: Once | INTRAVENOUS | Status: AC
Start: 2018-01-01 — End: 2018-01-02
  Administered 2018-01-01: 10 [IU] via INTRAVENOUS

## 2018-01-01 MED ORDER — CALCIUM GLUCONATE 10 % IV SOLN
1.0000 g | Freq: Once | INTRAVENOUS | Status: AC
  Administered 2018-01-01: 1 g via INTRAVENOUS
  Filled 2018-01-01: qty 10

## 2018-01-01 MED ORDER — ENOXAPARIN SODIUM 30 MG/0.3ML ~~LOC~~ SOLN
30.0000 mg | SUBCUTANEOUS | Status: DC
  Administered 2018-01-02: 30 mg via SUBCUTANEOUS
  Filled 2018-01-01: qty 0.3

## 2018-01-01 MED ORDER — SODIUM CHLORIDE 0.9 % IV SOLN
INTRAVENOUS | Status: DC
  Administered 2018-01-01: 20:00:00 via INTRAVENOUS

## 2018-01-01 MED ORDER — ACETAMINOPHEN 325 MG PO TABS: 650.0000 mg | ORAL_TABLET | Freq: Four times a day (QID) | ORAL | Status: DC | PRN

## 2018-01-01 MED ORDER — SODIUM BICARBONATE 8.4 % IV SOLN
INTRAVENOUS | Status: AC
  Filled 2018-01-01: qty 50

## 2018-01-01 MED ORDER — SODIUM BICARBONATE 8.4 % IV SOLN
50.0000 meq | Freq: Once | INTRAVENOUS | Status: AC
  Administered 2018-01-02: 50 meq via INTRAVENOUS
  Filled 2018-01-01: qty 50
  Filled 2018-01-01: qty 100

## 2018-01-01 MED ORDER — ONDANSETRON HCL 4 MG/2ML IJ SOLN: 4.0000 mg | Freq: Four times a day (QID) | INTRAMUSCULAR | Status: DC | PRN

## 2018-01-01 MED ORDER — ALBUTEROL SULFATE (2.5 MG/3ML) 0.083% IN NEBU
10.0000 mg | INHALATION_SOLUTION | Freq: Once | RESPIRATORY_TRACT | Status: AC
  Administered 2018-01-01: 10 mg via RESPIRATORY_TRACT
  Filled 2018-01-01: qty 12

## 2018-01-01 MED ORDER — INSULIN ASPART 100 UNIT/ML IV SOLN
6.0000 [IU] | Freq: Once | INTRAVENOUS | Status: AC
  Administered 2018-01-01: 6 [IU] via INTRAVENOUS

## 2018-01-01 MED ORDER — DEXTROSE 5 % IV SOLN
Freq: Once | INTRAVENOUS | Status: AC
  Administered 2018-01-02: via INTRAVENOUS
  Filled 2018-01-01: qty 100

## 2018-01-01 MED ORDER — DEXTROSE 50 % IV SOLN
1.0000 | Freq: Once | INTRAVENOUS | Status: AC
  Administered 2018-01-01: 50 mL via INTRAVENOUS

## 2018-01-01 NOTE — ED Notes (Signed)
CRITICAL VALUE ALERT  Critical Value:  Potassium > 7.5  Date & Time Notied:  01/01/18 & 2251 hrs  Provider Notified: Dr. Olevia Bowens  Orders Received/Actions taken: N/A

## 2018-01-01 NOTE — ED Triage Notes (Signed)
Sent to ED by Dr Harl Bowie for high potassium

## 2018-01-01 NOTE — H&P (Signed)
History and Physical    [name] STM:[id number] DOB: 06-04-1966 DOA: 01/01/2018  PCP: Fayrene Helper, MD   Patient coming from: Home.  I have personally briefly reviewed patient's old medical records in [place]  Chief Complaint: High potassium.  HPI: [name] is a 52 y.o. female with medical history significant of osteoarthritis, history of cardiomyopathy with an EF 20 to 25%, history of AICD placement, history of ventricular fibrillation, history of paroxysmal ventricular tachycardia, CAD, history of MI, anxiety, depression, type 2 diabetes, GERD, migraine headaches, history of SLE, nicotine abuse who is coming to the emergency department referred by her cardiologist for hyperkalemia.  The patient was recently admitted to the hospital due to the syncopal episode and was found to be hypokalemic.  She was taking 20 mEq of potassium twice daily and this was increased to 40 mEq twice daily due to hypokalemia.  Subsequently discharged home 60 twice daily due to persistently low potassium.  However, lab work done today reveals hyperkalemia and AKI.  She mentions that she has continue everything as per her routine.  She complains that she has been getting body aches, muscle cramps and lightheadedness.  The patient was hypotensive and diaphoretic when she first arrived.  Nursing staff.  She denies fever, chills, sore throat, dyspnea, wheezing, chest pain, palpitations, dizziness, PND, orthopnea or recent pitting edema of the lower extremities.  No abdominal pain, nausea, emesis, diarrhea, constipation, melena or hematochezia.  No heat or cold intolerance.  No polyuria, polydipsia, polyphagia or blurred vision.  No rashes or pruritus.  ED Course: Initial vital signs temperature 98 F, pulse 79, respirations 20, blood pressure 71/46 mmHg and O2 sat 100% on room air.  The patient received the hyperkalemia protocol treatment including albuterol, glucose, insulin,  sodium bicarbonate injection and infusion, calcium gluconate and Kayexalate.  Her lab work White count 5.7, hemoglobin 14.5 and platelets 329.  Sodium 132, potassium more than 7.5, chloride 102 and CO2 23 mmol/L.  Her glucose was 68, BUN 34, creatinine 2.59, calcium and magnesium 2.9 mg/dL.  Follow-up potassium was 5.8 mmol/L.  Urine sodium was 98 and urine potassium 24 mmol/L.  Review of Systems: As per HPI otherwise 10 point review of systems negative.    Past Medical History:  Diagnosis Date  . Arthritis of knee   . Automatic implantable cardiac defibrillator in situ    a. s/p prior Medtronic ICD with 6949 lead. b. s/p Gen change & lead revision 05/2013.  . Cardiomyopathy secondary    Patient has cardiomyopathy out of proportion to her ischemic heart disease  . coronary artery disease    RCA stenting Myoview 2011 EF 30% infarction dilatation without ischemia  . Depression   . Diabetes mellitus   . GERD (gastroesophageal reflux disease)   . Insomnia   . Lupus (systemic lupus erythematosus) ([place])   . Migraines   . Myocardial infarction ([place])    8 total   . Nicotine abuse   . Other and unspecified hyperlipidemia   . Paroxysmal VT ([place])   . Systolic CHF ([place])   . VF (ventricular fibrillation) ([place])    a. Hx appropriate ICD therapy for VF.    Past Surgical History:  Procedure Laterality Date  . ABDOMINAL HYSTERECTOMY    . CHOLECYSTECTOMY    . COLONOSCOPY  07/15/2011   SLF: internal hemorrhoids/hyperplastic polyps in the rectum/tubular adenomaSURVEILLANCE Nov 2017  . defibrillator placed   2009 and 05/2013  .  ICD GENERATOR CHANGE  06/17/2013   Dr Caryl Comes  . IMPLANTABLE CARDIOVERTER DEFIBRILLATOR (ICD) GENERATOR CHANGE Left 06/17/2013   Procedure: ICD GENERATOR CHANGE;  Surgeon: Deboraha Sprang, MD;  Location: [facility] CATH LAB;  Service: Cardiovascular;  Laterality: Left;  . LEAD REVISION N/A 06/17/2013   Procedure: LEAD REVISION;  Surgeon: Deboraha Sprang, MD;  Location: [facility] CATH LAB;   Service: Cardiovascular;  Laterality: N/A;  . ROTATOR CUFF REPAIR Right 2002     reports that she has been smoking cigarettes.  She has a 7.50 pack-year smoking history. She has never used smokeless tobacco. She reports that she drinks alcohol. She reports that she does not use drugs.  Allergies  Allergen Reactions  . Amiodarone     Headache and facial swelling  . Bee Venom Anaphylaxis  . Mexiletine   . Cortisone Swelling  . Robaxin [Methocarbamol] Hives and Itching    Family History  Problem Relation Age of Onset  . Hepatitis Mother 53       HCV  . Liver cancer Mother 63  . Cancer Mother   . Diabetes Mother   . Heart disease Mother   . Hyperlipidemia Mother   . Stroke Sister 45       x3  . Diabetes Sister   . Hyperlipidemia Sister   . Dementia Father   . HIV Sister   . Diabetes Brother   . Heart disease Brother   . Hyperlipidemia Brother     Prior to Admission medications   Medication Sig Start Date End Date Taking? Authorizing Provider  albuterol (PROVENTIL HFA;VENTOLIN HFA) 108 (90 Base) MCG/ACT inhaler Inhale 2 puffs into the lungs every 6 (six) hours as needed for wheezing or shortness of breath. 03/13/16  Yes Fayrene Helper, MD  ALPRAZolam Duanne Moron) 1 MG tablet One tablet at bedtime Patient taking differently: Take 1 mg by mouth at bedtime as needed for anxiety or sleep. One tablet at bedtime 11/04/17  Yes Fayrene Helper, MD  aspirin 81 MG tablet Take 1 tablet (81 mg total) by mouth daily. 09/26/13  Yes Deboraha Sprang, MD  atorvastatin (LIPITOR) 10 MG tablet Take 1 tablet (10 mg total) by mouth daily. 07/20/17  Yes Deboraha Sprang, MD  budesonide-formoterol [facility]) 80-4.5 MCG/ACT inhaler Inhale 2 puffs into the lungs 2 (two) times daily. Patient taking differently: Inhale 2 puffs into the lungs 2 (two) times daily as needed (wheezing).  04/02/16  Yes Fayrene Helper, MD  carvedilol (COREG) 12.5 MG tablet Take 1 tablet (12.5 mg total) by mouth 2 (two)  times daily. 12/21/17 03/21/18 Yes Bensimhon, Shaune Pascal, MD  cholecalciferol (VITAMIN D) 1000 units tablet Take 1,000 Units by mouth daily.   Yes [provider]  clopidogrel (PLAVIX) 75 MG tablet Take 1 tablet (75 mg total) by mouth daily. 11/04/17  Yes Fayrene Helper, MD  cyclobenzaprine (FLEXERIL) 5 MG tablet Take 1 tablet (5 mg total) by mouth at bedtime. 11/04/17  Yes Fayrene Helper, MD  fenofibrate (TRICOR) 145 MG tablet Take 145 mg by mouth daily.  04/15/13  Yes [provider]  furosemide (LASIX) 20 MG tablet Take 2 tablets (40 mg total) by mouth daily. 11/11/17  Yes Bensimhon, Shaune Pascal, MD  losartan (COZAAR) 25 MG tablet Take 1 tablet (25 mg total) by mouth daily. 07/20/17 12/10/18 Yes Deboraha Sprang, MD  magnesium oxide (MAG-OX) 400 (241.3 Mg) MG tablet Take 1 tablet (400 mg total) by mouth daily. 12/26/17 01/25/18 Yes Manuella Ghazi, Pratik  D, DO  montelukast (SINGULAIR) 10 MG tablet Take 1 tablet (10 mg total) by mouth at bedtime. 11/04/17  Yes Fayrene Helper, MD  oxyCODONE (ROXICODONE) 15 MG immediate release tablet Take 15 mg by mouth 2 (two) times daily.  12/09/17 01/08/18 Yes [provider]  potassium chloride SA (K-DUR,KLOR-CON) 20 MEQ tablet Take 3 tablets (60 mEq total) by mouth 2 (two) times daily. 12/25/17 01/24/18 Yes Shah, Pratik D, DO  spironolactone (ALDACTONE) 25 MG tablet Take 0.5 tablets (12.5 mg total) by mouth daily. Patient taking differently: Take 25 mg by mouth daily.  07/24/17 12/10/18 Yes Bensimhon, Shaune Pascal, MD  terbinafine (LAMISIL) 250 MG tablet Take 1 tablet (250 mg total) by mouth daily. 11/04/17  Yes Fayrene Helper, MD    Physical Exam: Vitals:   01/01/18 2030 01/01/18 2100 01/01/18 2130 01/01/18 2200  BP: (!) 92/57 (!) 88/62 (!) 83/58 (!) 97/57  Pulse: 81  77 81  Resp: (!) 21 (!) 24 (!) 24 20  Temp:      TempSrc:      SpO2: 100% 100% 98% 94%  Weight:      Height:        Constitutional: NAD, calm, comfortable Eyes: PERRL, lids  and conjunctivae normal ENMT: Mucous membranes are moist. Posterior pharynx clear of any exudate or lesions. Neck: normal, supple, no masses, no thyromegaly Respiratory: clear to auscultation bilaterally, no wheezing, no crackles. Normal respiratory effort. No accessory muscle use.  Cardiovascular: Regular rate and rhythm, no murmurs / rubs / gallops. No extremity edema. 2+ pedal pulses. No carotid bruits.  Abdomen: no tenderness, no masses palpated. No hepatosplenomegaly. Bowel sounds positive.  Musculoskeletal: no clubbing / cyanosis. No joint deformity upper and lower extremities. Good ROM, no contractures. Normal muscle tone.  Skin: Positive ecchymosis area on right periorbital area. Neurologic: CN 2-12 grossly intact. Sensation intact, DTR normal. Strength 5/5 in all 4.  Psychiatric: Normal judgment and insight. Alert and oriented x 3. Normal mood.    Labs on Admission: I have personally reviewed following labs and imaging studies  CBC: Recent Labs  Lab 01/01/18 1923  WBC 5.7  NEUTROABS 3.0  HGB 14.5  HCT 44.7  MCV 83.4  PLT 725   Basic Metabolic Panel: Recent Labs  Lab 01/01/18 1613 01/01/18 1923  NA 129* 132*  K 6.5* >7.5*  CL 98* 102  CO2 23 23  GLUCOSE 140* 68  BUN 31* 34*  CREATININE 2.07* 2.59*  CALCIUM 9.7 9.9  MG 2.7* 2.9*  PHOS  --  5.0*   GFR: Estimated Creatinine Clearance: 29.2 mL/min (A) (by C-G formula based on SCr of 2.59 mg/dL (H)). Liver Function Tests: Recent Labs  Lab 01/01/18 1923  ALBUMIN 4.4   No results for input(s): LIPASE, AMYLASE in the last 168 hours. No results for input(s): AMMONIA in the last 168 hours. Coagulation Profile: No results for input(s): INR, PROTIME in the last 168 hours. Cardiac Enzymes: No results for input(s): CKTOTAL, CKMB, CKMBINDEX, TROPONINI in the last 168 hours. BNP (last 3 results) No results for input(s): PROBNP in the last 8760 hours. HbA1C: No results for input(s): HGBA1C in the last 72  hours. CBG: No results for input(s): GLUCAP in the last 168 hours. Lipid Profile: No results for input(s): CHOL, HDL, LDLCALC, TRIG, CHOLHDL, LDLDIRECT in the last 72 hours. Thyroid Function Tests: No results for input(s): TSH, T4TOTAL, FREET4, T3FREE, THYROIDAB in the last 72 hours. Anemia Panel: No results for input(s): VITAMINB12, FOLATE, FERRITIN, TIBC, IRON,  RETICCTPCT in the last 72 hours. Urine analysis:    Component Value Date/Time   COLORURINE YELLOW 12/05/2016 0316   APPEARANCEUR CLEAR 12/05/2016 0316   LABSPEC 1.005 12/05/2016 0316   PHURINE 6.0 12/05/2016 0316   GLUCOSEU NEGATIVE 12/05/2016 0316   HGBUR SMALL (A) 12/05/2016 0316   HGBUR large 08/07/2010 0901   BILIRUBINUR NEGATIVE 12/05/2016 0316   BILIRUBINUR neg 07/31/2016 0854   KETONESUR NEGATIVE 12/05/2016 0316   PROTEINUR NEGATIVE 12/05/2016 0316   UROBILINOGEN 1.0 07/31/2016 0854   UROBILINOGEN 0.2 09/13/2012 2220   NITRITE NEGATIVE 12/05/2016 0316   LEUKOCYTESUR TRACE (A) 12/05/2016 0316    Radiological Exams on Admission: No results found.  EKG: Independently reviewed. Vent. rate 80 BPM PR interval * ms QRS duration 149 ms QT/QTc 423/488 ms P-R-T axes 55 73 163 Sinus rhythm Left atrial enlargement Nonspecific intraventricular conduction delay Abnormal T, consider ischemia, lateral leads  Assessment/Plan Principal Problem:   Hyperkalemia Has already received albuterol, Kayexalate, glucose insulin and calcium gluconate. Observation/stepdown for closer monitoring. Continue sodium bicarbonate infusion. Hold potassium supplement. Hold losartan and spironolactone. Follow-up potassium level.  Active Problems:   AKI (acute kidney injury) ([place]) Likely secondary to diuretic use. Hold diuretics and ARB. Careful IV hydration with sodium bicarb. Monitor intake and output. Follow-up renal function and electrolytes in the morning.    Diabetes mellitus type 2 with complications ([place]) Currently just  doing lifestyle modifications. Continue carbohydrate modified diet. CBG monitoring with regular insulin sliding scale in the hospital.    Hyperlipidemia LDL goal <100 Continue atorvastatin and fenofibrate.    HTN, goal below 130/80 Continue carvedilol 12.5 mg p.o. twice daily. Hold furosemide due to AKI. Hold losartan and spironolactone secondary to hyperkalemia. Monitor blood pressure, heart rate, renal function and electrolytes.    Depression with anxiety Only taking alprazolam. Should discuss other treatment options with her PCP.    GERD (gastroesophageal reflux disease) Protonix 40 mg orally daily.    Chronic systolic heart failure ([facility]) No signs of decompensation when examined. Continue carvedilol 12.5 mg p.o. twice daily. Hold furosemide due to AKI. Hold losartan and Spironolactone due to hyperkalemia. Monitor daily weights, intake and output.    Hypermagnesemia Hold magnesium supplement.    DVT prophylaxis: Lovenox SQ. Code Status: Full code. Family Communication:  Disposition Plan: Observation for hyperkalemia treatment. Consults called:  Admission status: Observation/stepdown.   Reubin Milan MD [facility] Pager [phone].  If 7PM-7AM, please contact night-coverage [email or web address] Password [email or web address]  01/01/2018, 11:12 PM

## 2018-01-01 NOTE — ED Provider Notes (Signed)
[facility] Provider Note   CSN: [id number] Arrival date & time: 01/01/18  1830     History   Chief Complaint No chief complaint on file.   HPI [name] is a 52 y.o. female.  HPI   Patient here for evaluation of known elevated potassium and magnesium.  She had follow-up labs drawn today at 4:13 PM.  Magnesium level elevated 2.7, potassium elevated 6.5.  Sample does not appear hemolyzed.  Also creatinine was elevated at 2.1.  Additional abnormalities, glucose 140, UN 31, GFR 26.  Renal insufficiency is new.  Patient has heart failure, and was recently admitted for same.  She takes both furosemide and spironolactone.  She denies chest pain, shortness of breath, paresthesia.  She is having trouble walking today because she feels weak when she stands up.  She fell 9 days ago and injured her right periorbital region.  She denies nausea, vomiting, blurred vision, at this time.  There are no other known modifying factors.  Past Medical History:  Diagnosis Date  . Arthritis of knee   . Automatic implantable cardiac defibrillator in situ    a. s/p prior [employer] ICD with 6949 lead. b. s/p Gen change & lead revision 05/2013.  . Cardiomyopathy secondary    Patient has cardiomyopathy out of proportion to her ischemic heart disease  . coronary artery disease    RCA stenting Myoview 2011 EF 30% infarction dilatation without ischemia  . Depression   . Diabetes mellitus   . GERD (gastroesophageal reflux disease)   . Insomnia   . Lupus (systemic lupus erythematosus) ([place])   . Migraines   . Myocardial infarction ([place])    8 total   . Nicotine abuse   . Other and unspecified hyperlipidemia   . Paroxysmal VT ([place])   . Systolic CHF ([place])   . VF (ventricular fibrillation) ([place])    a. Hx appropriate ICD therapy for VF.    Patient Active Problem List   Diagnosis Date Noted  . Syncope 12/25/2017  . Hypokalemia 12/25/2017  . Hypomagnesemia 12/25/2017  .  Chronic systolic heart failure ([place]) 12/09/2017  . Onychomycosis 11/04/2017  . VF (ventricular fibrillation) ([place])   . Systolic CHF ([place])   . Paroxysmal VT ([place])   . Nicotine abuse   . Myocardial infarction ([place])   . Migraines   . Lupus (systemic lupus erythematosus) ([place])   . Insomnia   . Depression   . Arthritis of knee   . Chest pain with high risk for cardiac etiology 06/29/2017  . GERD (gastroesophageal reflux disease) 06/29/2017  . Muscle spasm of back 03/04/2017  . Vitamin D deficiency 07/26/2015  . Depression with anxiety 03/11/2015  . Metabolic syndrome X 50/53/9767  . Adhesive capsulitis of left shoulder 09/26/2013  . Ventricular tachycardia (paroxysmal) ([place]) 12/17/2012  . Nicotine dependence 11/07/2012  . HSV-2 seropositive 11/05/2012  . H/O abnormal Pap smear 11/02/2012  . Fatty liver 08/10/2012  . HTN, goal below 130/80 09/13/2011  . Allergic rhinitis 09/11/2011  . Ischemic cardiomyopathy 05/27/2011  . Implantable cardioverter-defibrillator (ICD) in situ 05/27/2011  . 6949 defibrillator lead 05/27/2011  . FATIGUE 06/05/2009  . Ventricular fibrillation ([place]) 02/04/2009  . Morbid obesity ([place]) 12/20/2008  . Diabetes mellitus type 2 with complications ([place]) 34/19/3790  . Hyperlipidemia LDL goal <100 08/31/2007  . ANXIETY 08/31/2007  . SLE 08/31/2007  . ARTHRITIS, KNEES, BILATERAL 08/31/2007  . AVASCULAR NECROSIS 08/31/2007  . MIGRAINES, HX OF 08/31/2007    Past Surgical History:  Procedure Laterality Date  . ABDOMINAL HYSTERECTOMY    . CHOLECYSTECTOMY    . COLONOSCOPY  07/15/2011   SLF: internal hemorrhoids/hyperplastic polyps in the rectum/tubular adenomaSURVEILLANCE Nov 2017  . defibrillator placed   2009 and 05/2013  . ICD GENERATOR CHANGE  06/17/2013   Dr Caryl Comes  . IMPLANTABLE CARDIOVERTER DEFIBRILLATOR (ICD) GENERATOR CHANGE Left 06/17/2013   Procedure: ICD GENERATOR CHANGE;  Surgeon: Deboraha Sprang, MD;  Location: [facility] CATH LAB;  Service:  Cardiovascular;  Laterality: Left;  . LEAD REVISION N/A 06/17/2013   Procedure: LEAD REVISION;  Surgeon: Deboraha Sprang, MD;  Location: [facility] CATH LAB;  Service: Cardiovascular;  Laterality: N/A;  . ROTATOR CUFF REPAIR Right 2002     OB History   None      Home Medications    Prior to Admission medications   Medication Sig Start Date End Date Taking? Authorizing Provider  albuterol (PROVENTIL HFA;VENTOLIN HFA) 108 (90 Base) MCG/ACT inhaler Inhale 2 puffs into the lungs every 6 (six) hours as needed for wheezing or shortness of breath. 03/13/16  Yes Fayrene Helper, MD  ALPRAZolam Duanne Moron) 1 MG tablet One tablet at bedtime Patient taking differently: Take 1 mg by mouth at bedtime as needed for anxiety or sleep. One tablet at bedtime 11/04/17  Yes Fayrene Helper, MD  aspirin 81 MG tablet Take 1 tablet (81 mg total) by mouth daily. 09/26/13  Yes Deboraha Sprang, MD  atorvastatin (LIPITOR) 10 MG tablet Take 1 tablet (10 mg total) by mouth daily. 07/20/17  Yes Deboraha Sprang, MD  budesonide-formoterol [facility]) 80-4.5 MCG/ACT inhaler Inhale 2 puffs into the lungs 2 (two) times daily. Patient taking differently: Inhale 2 puffs into the lungs 2 (two) times daily as needed (wheezing).  04/02/16  Yes Fayrene Helper, MD  carvedilol (COREG) 12.5 MG tablet Take 1 tablet (12.5 mg total) by mouth 2 (two) times daily. 12/21/17 03/21/18 Yes Bensimhon, Shaune Pascal, MD  cholecalciferol (VITAMIN D) 1000 units tablet Take 1,000 Units by mouth daily.   Yes [provider]  clopidogrel (PLAVIX) 75 MG tablet Take 1 tablet (75 mg total) by mouth daily. 11/04/17  Yes Fayrene Helper, MD  cyclobenzaprine (FLEXERIL) 5 MG tablet Take 1 tablet (5 mg total) by mouth at bedtime. 11/04/17  Yes Fayrene Helper, MD  fenofibrate (TRICOR) 145 MG tablet Take 145 mg by mouth daily.  04/15/13  Yes [provider]  furosemide (LASIX) 20 MG tablet Take 2 tablets (40 mg total) by mouth daily. 11/11/17  Yes  Bensimhon, Shaune Pascal, MD  losartan (COZAAR) 25 MG tablet Take 1 tablet (25 mg total) by mouth daily. 07/20/17 12/10/18 Yes Deboraha Sprang, MD  magnesium oxide (MAG-OX) 400 (241.3 Mg) MG tablet Take 1 tablet (400 mg total) by mouth daily. 12/26/17 01/25/18 Yes Shah, Pratik D, DO  montelukast (SINGULAIR) 10 MG tablet Take 1 tablet (10 mg total) by mouth at bedtime. 11/04/17  Yes Fayrene Helper, MD  oxyCODONE (ROXICODONE) 15 MG immediate release tablet Take 15 mg by mouth 2 (two) times daily.  12/09/17 01/08/18 Yes [provider]  potassium chloride SA (K-DUR,KLOR-CON) 20 MEQ tablet Take 3 tablets (60 mEq total) by mouth 2 (two) times daily. 12/25/17 01/24/18 Yes Shah, Pratik D, DO  spironolactone (ALDACTONE) 25 MG tablet Take 0.5 tablets (12.5 mg total) by mouth daily. Patient taking differently: Take 25 mg by mouth daily.  07/24/17 12/10/18 Yes Bensimhon, Shaune Pascal, MD  terbinafine (LAMISIL) 250 MG tablet Take  1 tablet (250 mg total) by mouth daily. 11/04/17  Yes Fayrene Helper, MD    Family History Family History  Problem Relation Age of Onset  . Hepatitis Mother 22       HCV  . Liver cancer Mother 50  . Cancer Mother   . Diabetes Mother   . Heart disease Mother   . Hyperlipidemia Mother   . Stroke Sister 45       x3  . Diabetes Sister   . Hyperlipidemia Sister   . Dementia Father   . HIV Sister   . Diabetes Brother   . Heart disease Brother   . Hyperlipidemia Brother     Social History Social History   Tobacco Use  . Smoking status: Current Every Day Smoker    Packs/day: 0.50    Years: 15.00    Pack years: 7.50    Types: Cigarettes  . Smokeless tobacco: Never Used  Substance Use Topics  . Alcohol use: Yes    Alcohol/week: 0.0 oz    Comment: occasionaly  . Drug use: No     Allergies   Amiodarone; Bee venom; Mexiletine; Cortisone; and Robaxin [methocarbamol]   Review of Systems Review of Systems  All other systems reviewed and are negative.    Physical  Exam Updated Vital Signs BP (!) 83/58   Pulse 77   Temp 98 F (36.7 C) (Temporal)   Resp (!) 24   Ht 5\' 6"  (1.676 m)   Wt 95.3 kg (210 lb)   SpO2 98%   BMI 33.89 kg/m   Physical Exam  Constitutional: She is oriented to person, place, and time. She appears well-developed and well-nourished. She appears distressed (She is uncomfortable).  HENT:  Head: Normocephalic and atraumatic.  Right Ear: External ear normal.  Left Ear: External ear normal.  Eyes: Pupils are equal, round, and reactive to light. Conjunctivae and EOM are normal.  Neck: Normal range of motion and phonation normal. Neck supple.  Cardiovascular: Normal rate, regular rhythm and normal heart sounds.  Hypotensive  Pulmonary/Chest: Effort normal and breath sounds normal. She exhibits no bony tenderness.  Abdominal: Soft. There is no tenderness.  Musculoskeletal: Normal range of motion.  Neurological: She is alert and oriented to person, place, and time. No cranial nerve deficit or sensory deficit. She exhibits normal muscle tone. Coordination normal.  Skin: Skin is warm, dry and intact.  Psychiatric: She has a normal mood and affect. Her behavior is normal. Judgment and thought content normal.  Nursing note and vitals reviewed.    ED Treatments / Results  Labs (all labs ordered are listed, but only abnormal results are displayed) Labs Reviewed  CBC WITH DIFFERENTIAL/PLATELET - Abnormal; Notable for the following components:      Result Value   RBC 5.36 (*)    All other components within normal limits  NA AND K (SODIUM & POTASSIUM), RAND UR  MAGNESIUM  RENAL FUNCTION PANEL    EKG None  Radiology No results found.  Procedures .Critical Care Performed by: Daleen Bo, MD Authorized by: Daleen Bo, MD   Critical care provider statement:    Critical care time (minutes):  45   Critical care start time:  01/01/2018 7:00 PM   Critical care end time:  01/01/2018 9:51 PM   Critical care time was  exclusive of:  Separately billable procedures and treating other patients   Critical care was necessary to treat or prevent imminent or life-threatening deterioration of the following conditions:  Metabolic  crisis and shock   Critical care was time spent personally by me on the following activities:  Blood draw for specimens, development of treatment plan with patient or surrogate, discussions with consultants, evaluation of patient's response to treatment, examination of patient, obtaining history from patient or surrogate, ordering and performing treatments and interventions, ordering and review of laboratory studies, pulse oximetry, re-evaluation of patient's condition, review of old charts and ordering and review of radiographic studies   (including critical care time)  Medications Ordered in ED Medications  0.9 %  sodium chloride infusion ( Intravenous New Bag/Given 01/01/18 1930)  sodium bicarbonate 100 mEq in dextrose 5 % 1,000 mL infusion ( Intravenous New Bag/Given 01/01/18 2034)  albuterol (PROVENTIL) (2.5 MG/3ML) 0.083% nebulizer solution 10 mg (10 mg Nebulization Given 01/01/18 2027)  insulin aspart (novoLOG) injection 10 Units (10 Units Intravenous Given 01/01/18 2003)  dextrose 50 % solution 50 mL (50 mLs Intravenous Given 01/01/18 2006)  sodium polystyrene (KAYEXALATE) 15 GM/60ML suspension 30 g (30 g Oral Given 01/01/18 2002)     Initial Impression / Assessment and Plan / ED Course  I have reviewed the triage vital signs and the nursing notes.  Pertinent labs & imaging results that were available during my care of the patient were reviewed by me and considered in my medical decision making (see chart for details).      Patient Vitals for the past 24 hrs:  BP Temp Temp src Pulse Resp SpO2 Height Weight  01/01/18 2130 (!) 83/58 - - 77 (!) 24 98 % - -  01/01/18 2100 (!) 88/62 - - - (!) 24 100 % - -  01/01/18 2030 (!) 92/57 - - 81 (!) 21 100 % - -  01/01/18 2027 - - - - - 97 % - -   01/01/18 1930 (!) 78/56 - - - 20 - - -  01/01/18 1838 - - - - - - 5\' 6"  (1.676 m) 95.3 kg (210 lb)  01/01/18 1836 (!) 71/46 98 F (36.7 C) Temporal 79 20 100 % - -    9:51 PM Reevaluation with update and discussion. After initial assessment and treatment, an updated evaluation reveals no change in clinical status.  Patient remains alert and comfortable.Daleen Bo   Medical Decision Making: Patient presenting with hyperkalemia, following treatment with both furosemide, and Lasix, as well as supplementation with high dose potassium.  Patient has history of heart failure.  Hypotension in ED, improved with fluid therapy.  She requires admission for monitoring, and stabilization.  CRITICAL CARE-yes Performed by: Daleen Bo   Nursing Notes Reviewed/ Care Coordinated Applicable Imaging Reviewed Interpretation of Laboratory Data incorporated into ED treatment   9:40 PM-Consult complete with hospitalist. Patient case explained and discussed.  He agrees to admit patient for further evaluation and treatment. Call ended at 9:45 PM  Plan: Admit    Final Clinical Impressions(s) / ED Diagnoses   Final diagnoses:  Hyperkalemia  Hypermagnesemia  AKI (acute kidney injury) ([place])  Hypotension, unspecified hypotension type    ED Discharge Orders    None       Daleen Bo, MD 01/01/18 2152

## 2018-01-01 NOTE — Progress Notes (Signed)
Lab called for K 6.5 Spoke with patient had been low and taking 6 K tabs/day on aldactone No cardiac symptoms Told to hold K and aldactone Go to [facility] ER and check ECG and repeat make sure not hemolyzed ? Get Kayexalate possibly  Verbalized understanding   Jenkins Rouge

## 2018-01-02 DIAGNOSIS — N179 Acute kidney failure, unspecified: Secondary | ICD-10-CM

## 2018-01-02 DIAGNOSIS — E875 Hyperkalemia: Secondary | ICD-10-CM | POA: Diagnosis not present

## 2018-01-02 DIAGNOSIS — I5022 Chronic systolic (congestive) heart failure: Secondary | ICD-10-CM | POA: Diagnosis not present

## 2018-01-02 LAB — NA AND K (SODIUM & POTASSIUM), RAND UR
Potassium Urine: 24 mmol/L
Sodium, Ur: 98 mmol/L

## 2018-01-02 LAB — BASIC METABOLIC PANEL
Anion gap: 9 (ref 5–15)
Anion gap: 8 (ref 5–15)
Anion gap: 10 (ref 5–15)
BUN: 29 mg/dL — AB (ref 6–20)
BUN: 29 mg/dL — AB (ref 6–20)
BUN: 26 mg/dL — AB (ref 6–20)
CO2: 26 mmol/L (ref 22–32)
CO2: 28 mmol/L (ref 22–32)
CO2: 26 mmol/L (ref 22–32)
CREATININE: 1.74 mg/dL — AB (ref 0.44–1.00)
CREATININE: 1.5 mg/dL — AB (ref 0.44–1.00)
Calcium: 9.4 mg/dL (ref 8.9–10.3)
Calcium: 9.1 mg/dL (ref 8.9–10.3)
Calcium: 9.3 mg/dL (ref 8.9–10.3)
Chloride: 100 mmol/L — ABNORMAL LOW (ref 101–111)
Chloride: 97 mmol/L — ABNORMAL LOW (ref 101–111)
Chloride: 97 mmol/L — ABNORMAL LOW (ref 101–111)
Creatinine, Ser: 1.93 mg/dL — ABNORMAL HIGH (ref 0.44–1.00)
GFR, EST AFRICAN AMERICAN: 33 mL/min — AB (ref >60)
GFR, EST AFRICAN AMERICAN: 37 mL/min — AB (ref >60)
GFR, EST AFRICAN AMERICAN: 45 mL/min — AB (ref >60)
GFR, EST NON AFRICAN AMERICAN: 29 mL/min — AB (ref >60)
GFR, EST NON AFRICAN AMERICAN: 32 mL/min — AB (ref >60)
GFR, EST NON AFRICAN AMERICAN: 39 mL/min — AB (ref >60)
Glucose, Bld: 68 mg/dL (ref 65–99)
Glucose, Bld: 124 mg/dL — ABNORMAL HIGH (ref 65–99)
Glucose, Bld: 144 mg/dL — ABNORMAL HIGH (ref 65–99)
POTASSIUM: 6.2 mmol/L — AB (ref 3.5–5.1)
POTASSIUM: 4.2 mmol/L (ref 3.5–5.1)
Potassium: 5 mmol/L (ref 3.5–5.1)
SODIUM: 135 mmol/L (ref 135–145)
SODIUM: 133 mmol/L — AB (ref 135–145)
SODIUM: 133 mmol/L — AB (ref 135–145)

## 2018-01-02 LAB — GLUCOSE, CAPILLARY: GLUCOSE-CAPILLARY: 203 mg/dL — AB (ref 65–99)

## 2018-01-02 MED ORDER — INSULIN ASPART 100 UNIT/ML ~~LOC~~ SOLN
0.0000 [IU] | Freq: Three times a day (TID) | SUBCUTANEOUS | Status: DC
  Administered 2018-01-02: 5 [IU] via SUBCUTANEOUS

## 2018-01-02 MED ORDER — ALPRAZOLAM 0.5 MG PO TABS: 1.0000 mg | ORAL_TABLET | Freq: Every evening | ORAL | Status: DC | PRN

## 2018-01-02 MED ORDER — DEXTROSE 50 % IV SOLN
25.0000 g | Freq: Once | INTRAVENOUS | Status: AC
  Administered 2018-01-02: 25 g via INTRAVENOUS
  Filled 2018-01-02: qty 50

## 2018-01-02 MED ORDER — CYCLOBENZAPRINE HCL 10 MG PO TABS: 5.0000 mg | ORAL_TABLET | Freq: Every day | ORAL | Status: DC

## 2018-01-02 MED ORDER — SODIUM BICARBONATE 8.4 % IV SOLN
50.0000 meq | Freq: Once | INTRAVENOUS | Status: AC
  Administered 2018-01-02: 50 meq via INTRAVENOUS
  Filled 2018-01-02: qty 50

## 2018-01-02 MED ORDER — ATORVASTATIN CALCIUM 10 MG PO TABS
10.0000 mg | ORAL_TABLET | Freq: Every day | ORAL | Status: DC
  Administered 2018-01-02: 10 mg via ORAL
  Filled 2018-01-02: qty 1

## 2018-01-02 MED ORDER — MOMETASONE FURO-FORMOTEROL FUM 100-5 MCG/ACT IN AERO
2.0000 | INHALATION_SPRAY | Freq: Two times a day (BID) | RESPIRATORY_TRACT | Status: DC
Start: 2018-01-02 — End: 2018-01-02
  Filled 2018-01-02: qty 8.8

## 2018-01-02 MED ORDER — VITAMIN D 1000 UNITS PO TABS
1000.0000 [IU] | ORAL_TABLET | Freq: Every day | ORAL | Status: DC
  Administered 2018-01-02: 1000 [IU] via ORAL
  Filled 2018-01-02: qty 1

## 2018-01-02 MED ORDER — OXYCODONE HCL 5 MG PO TABS: 15.0000 mg | ORAL_TABLET | Freq: Two times a day (BID) | ORAL | Status: DC | PRN

## 2018-01-02 MED ORDER — CARVEDILOL 12.5 MG PO TABS
12.5000 mg | ORAL_TABLET | Freq: Two times a day (BID) | ORAL | Status: DC
  Administered 2018-01-02 (×2): 12.5 mg via ORAL
  Filled 2018-01-02 (×2): qty 1

## 2018-01-02 MED ORDER — ENOXAPARIN SODIUM 40 MG/0.4ML ~~LOC~~ SOLN: 40.0000 mg | SUBCUTANEOUS | Status: DC

## 2018-01-02 MED ORDER — CLOPIDOGREL BISULFATE 75 MG PO TABS
75.0000 mg | ORAL_TABLET | Freq: Every day | ORAL | Status: DC
  Administered 2018-01-02: 75 mg via ORAL
  Filled 2018-01-02: qty 1

## 2018-01-02 MED ORDER — MONTELUKAST SODIUM 10 MG PO TABS
10.0000 mg | ORAL_TABLET | Freq: Every day | ORAL | Status: DC
  Administered 2018-01-02: 10 mg via ORAL
  Filled 2018-01-02: qty 1

## 2018-01-02 MED ORDER — CALCIUM GLUCONATE 10 % IV SOLN
1.0000 g | Freq: Once | INTRAVENOUS | Status: DC
  Filled 2018-01-02: qty 10

## 2018-01-02 MED ORDER — INSULIN ASPART 100 UNIT/ML ~~LOC~~ SOLN
8.0000 [IU] | Freq: Once | SUBCUTANEOUS | Status: AC
  Administered 2018-01-02: 8 [IU] via INTRAVENOUS

## 2018-01-02 MED ORDER — ASPIRIN 81 MG PO CHEW
81.0000 mg | CHEWABLE_TABLET | Freq: Every day | ORAL | Status: DC
  Administered 2018-01-02: 81 mg via ORAL
  Filled 2018-01-02: qty 1

## 2018-01-02 MED ORDER — FENOFIBRATE 160 MG PO TABS
160.0000 mg | ORAL_TABLET | Freq: Every day | ORAL | Status: DC
Start: 2018-01-02 — End: 2018-01-02
  Administered 2018-01-02: 160 mg via ORAL
  Filled 2018-01-02 (×3): qty 1

## 2018-01-02 MED ORDER — SODIUM BICARBONATE 8.4 % IV SOLN
Freq: Once | INTRAVENOUS | Status: AC
  Administered 2018-01-02: 04:00:00 via INTRAVENOUS
  Filled 2018-01-02: qty 100

## 2018-01-02 NOTE — Discharge Summary (Signed)
Physician Discharge Summary  [name] JOI:[id number] DOB: 1965-12-17 DOA: 01/01/2018  PCP: Fayrene Helper, MD  Admit date: 01/01/2018 Discharge date: 01/02/2018  Time spent: 45 minutes  Recommendations for Outpatient Follow-up:  -To be discharged home today. -Has appointment with her cardiologist on 5/21 which is 4 days after discharge.  At that time basic metabolic profile should be drawn to follow her renal function and electrolytes, specifically potassium.  -Please note that her Lasix, Aldactone, and Cozaar are on hold at this time and consideration to reinitiating slowly should be given at follow-up appointment as long as renal function and electrolytes are improved.  Discharge Diagnoses:  Principal Problem:   Hyperkalemia Active Problems:   Diabetes mellitus type 2 with complications (HCC)   Hyperlipidemia LDL goal <100   HTN, goal below 130/80   Depression with anxiety   GERD (gastroesophageal reflux disease)   Chronic systolic heart failure ([place])   Hypermagnesemia   AKI (acute kidney injury) ([place])   Discharge Condition: Stable and improved  Filed Weights   01/01/18 1838 01/02/18 0140  Weight: 95.3 kg (210 lb) 94 kg (207 lb 3.7 oz)    History of present illness:  As per Dr. Olevia Bowens on 5/17: [name] is a 52 y.o. female with medical history significant of osteoarthritis, history of cardiomyopathy with an EF 20 to 25%, history of AICD placement, history of ventricular fibrillation, history of paroxysmal ventricular tachycardia, CAD, history of MI, anxiety, depression, type 2 diabetes, GERD, migraine headaches, history of SLE, nicotine abuse who is coming to the emergency department referred by her cardiologist for hyperkalemia.  The patient was recently admitted to the hospital due to the syncopal episode and was found to be hypokalemic.  She was taking 20 mEq of potassium twice daily and this was increased to 40 mEq twice daily due to  hypokalemia.  Subsequently discharged home 60 twice daily due to persistently low potassium.  However, lab work done today reveals hyperkalemia and AKI.  She mentions that she has continue everything as per her routine.  She complains that she has been getting body aches, muscle cramps and lightheadedness.  The patient was hypotensive and diaphoretic when she first arrived.  Nursing staff.  She denies fever, chills, sore throat, dyspnea, wheezing, chest pain, palpitations, dizziness, PND, orthopnea or recent pitting edema of the lower extremities.  No abdominal pain, nausea, emesis, diarrhea, constipation, melena or hematochezia.  No heat or cold intolerance.  No polyuria, polydipsia, polyphagia or blurred vision.  No rashes or pruritus.  ED Course: Initial vital signs temperature 98 F, pulse 79, respirations 20, blood pressure 71/46 mmHg and O2 sat 100% on room air.  The patient received the hyperkalemia protocol treatment including albuterol, glucose, insulin, sodium bicarbonate injection and infusion, calcium gluconate and Kayexalate.  Her lab work White count 5.7, hemoglobin 14.5 and platelets 329.  Sodium 132, potassium more than 7.5, chloride 102 and CO2 23 mmol/L.  Her glucose was 68, BUN 34, creatinine 2.59, calcium and magnesium 2.9 mg/dL.  Follow-up potassium was 5.8 mmol/L.  Urine sodium was 98 and urine potassium 24 mmol/L.   Hospital Course:   Hyperkalemia -Likely a function of overcorrection of her hypokalemia in addition to medications that could have contributed to acute renal failure in addition to decreased interest circulatory volume such as Lasix. -She is also on potassium sparing diuretic, spironolactone and an ARB, Cozaar. -Potassium is corrected to 4.2 on discharge. -At this point she has been instructed to  hold off on taking further potassium supplementation, Lasix, Aldactone and ARB until seen in cardiology's office on May 21, in 5 days, when renal function and electrolytes  will be followed. -She was treated with a concoction of potassium reducing medications including Kayexalate, albuterol nebs, insulin and D50 combination, she was given calcium gluconate as well due to peaked T waves on EKG to stabilize cardiac membrane.  Chronic systolic heart failure -Has been compensated this hospitalization, in fact she did appear to be volume depleted. -Continue statin, aspirin, carvedilol.  ACE inhibitor is currently on hold due to hyperkalemia and renal dysfunction.  Acute renal failure -Baseline creatinine was 0.77 earlier this month, creatinine on admission was 2.59 and is down to 1.55 on discharge.    Procedures:  None   Consultations:  None  Discharge Instructions  Discharge Instructions    Diet - low sodium heart healthy   Complete by:  As directed    Increase activity slowly   Complete by:  As directed      Allergies as of 01/02/2018      Reactions   Amiodarone    Headache and facial swelling   Bee Venom Anaphylaxis   Mexiletine    Cortisone Swelling   Robaxin [methocarbamol] Hives, Itching      Medication List    STOP taking these medications   furosemide 20 MG tablet Commonly known as:  LASIX   losartan 25 MG tablet Commonly known as:  COZAAR   potassium chloride SA 20 MEQ tablet Commonly known as:  K-DUR,KLOR-CON   spironolactone 25 MG tablet Commonly known as:  ALDACTONE     TAKE these medications   albuterol 108 (90 Base) MCG/ACT inhaler Commonly known as:  PROVENTIL HFA;VENTOLIN HFA Inhale 2 puffs into the lungs every 6 (six) hours as needed for wheezing or shortness of breath.   ALPRAZolam 1 MG tablet Commonly known as:  XANAX One tablet at bedtime What changed:    how much to take  how to take this  when to take this  reasons to take this  additional instructions   aspirin 81 MG tablet Take 1 tablet (81 mg total) by mouth daily.   atorvastatin 10 MG tablet Commonly known as:  LIPITOR Take 1 tablet (10  mg total) by mouth daily.   budesonide-formoterol 80-4.5 MCG/ACT inhaler Commonly known as:  SYMBICORT Inhale 2 puffs into the lungs 2 (two) times daily. What changed:    when to take this  reasons to take this   carvedilol 12.5 MG tablet Commonly known as:  COREG Take 1 tablet (12.5 mg total) by mouth 2 (two) times daily.   cholecalciferol 1000 units tablet Commonly known as:  VITAMIN D Take 1,000 Units by mouth daily.   clopidogrel 75 MG tablet Commonly known as:  PLAVIX Take 1 tablet (75 mg total) by mouth daily.   cyclobenzaprine 5 MG tablet Commonly known as:  FLEXERIL Take 1 tablet (5 mg total) by mouth at bedtime.   fenofibrate 145 MG tablet Commonly known as:  TRICOR Take 145 mg by mouth daily.   magnesium oxide 400 (241.3 Mg) MG tablet Commonly known as:  MAG-OX Take 1 tablet (400 mg total) by mouth daily.   montelukast 10 MG tablet Commonly known as:  SINGULAIR Take 1 tablet (10 mg total) by mouth at bedtime.   oxyCODONE 15 MG immediate release tablet Commonly known as:  ROXICODONE Take 15 mg by mouth 2 (two) times daily.   terbinafine 250 MG tablet  Commonly known as:  LAMISIL Take 1 tablet (250 mg total) by mouth daily.      Allergies  Allergen Reactions  . Amiodarone     Headache and facial swelling  . Bee Venom Anaphylaxis  . Mexiletine   . Cortisone Swelling  . Robaxin [Methocarbamol] Hives and Itching   Follow-up Information    Fayrene Helper, MD. Schedule an appointment as soon as possible for a visit in 1 week(s).   Specialty:  Family Medicine Contact information: [address] [place] [address] [phone]            The results of significant diagnostics from this hospitalization (including imaging, microbiology, ancillary and laboratory) are listed below for reference.    Significant Diagnostic Studies: Dg Chest 2 View  Result Date: 12/25/2017 CLINICAL DATA:  52 year old female with fall. Patient is  having chest pain. EXAM: CHEST - 2 VIEW COMPARISON:  Chest radiograph dated 06/29/2017 FINDINGS: The lungs are clear. There is no pleural effusion or pneumothorax. Mild cardiomegaly left pectoral AICD device. No acute osseous pathology. IMPRESSION: No active cardiopulmonary disease. Electronically Signed   By: Anner Crete M.D.   On: 12/25/2017 01:27   Dg Elbow Complete Right  Result Date: 12/25/2017 CLINICAL DATA:  52 year old female with fall and right elbow pain. EXAM: RIGHT ELBOW - COMPLETE 3+ VIEW COMPARISON:  None. FINDINGS: There is no evidence of fracture, dislocation, or joint effusion. There is no evidence of arthropathy or other focal bone abnormality. Soft tissues are unremarkable. IMPRESSION: Negative. Electronically Signed   By: Anner Crete M.D.   On: 12/25/2017 01:29   Ct Head Wo Contrast  Result Date: 12/25/2017 CLINICAL DATA:  52 year old female with fall. EXAM: CT HEAD WITHOUT CONTRAST CT MAXILLOFACIAL WITHOUT CONTRAST CT CERVICAL SPINE WITHOUT CONTRAST TECHNIQUE: Multidetector CT imaging of the head, cervical spine, and maxillofacial structures were performed using the standard protocol without intravenous contrast. Multiplanar CT image reconstructions of the cervical spine and maxillofacial structures were also generated. COMPARISON:  Head CT dated 11/03/2008 FINDINGS: CT HEAD FINDINGS Brain: No evidence of acute infarction, hemorrhage, hydrocephalus, extra-axial collection or mass lesion/mass effect. Vascular: No hyperdense vessel or unexpected calcification. Skull: Normal. Negative for fracture or focal lesion. Other: Mild right periorbital contusion. CT MAXILLOFACIAL FINDINGS Osseous: No fracture or mandibular dislocation. No destructive process. Orbits: Negative. No traumatic or inflammatory finding. Sinuses: Clear. Soft tissues: Mild right periorbital soft tissue contusion. No hematoma. CT CERVICAL SPINE FINDINGS Alignment: Normal. Skull base and vertebrae: No acute  fracture. No primary bone lesion or focal pathologic process. Soft tissues and spinal canal: No prevertebral fluid or swelling. No visible canal hematoma. Disc levels: No acute findings. No significant degenerative changes. Upper chest: Negative. Other: None IMPRESSION: 1. No acute intracranial pathology. 2. No acute facial bone fractures. 3. No acute/traumatic cervical spine pathology. Electronically Signed   By: Anner Crete M.D.   On: 12/25/2017 01:43   Ct Cervical Spine Wo Contrast  Result Date: 12/25/2017 CLINICAL DATA:  52 year old female with fall. EXAM: CT HEAD WITHOUT CONTRAST CT MAXILLOFACIAL WITHOUT CONTRAST CT CERVICAL SPINE WITHOUT CONTRAST TECHNIQUE: Multidetector CT imaging of the head, cervical spine, and maxillofacial structures were performed using the standard protocol without intravenous contrast. Multiplanar CT image reconstructions of the cervical spine and maxillofacial structures were also generated. COMPARISON:  Head CT dated 11/03/2008 FINDINGS: CT HEAD FINDINGS Brain: No evidence of acute infarction, hemorrhage, hydrocephalus, extra-axial collection or mass lesion/mass effect. Vascular: No hyperdense vessel or unexpected calcification. Skull:  Normal. Negative for fracture or focal lesion. Other: Mild right periorbital contusion. CT MAXILLOFACIAL FINDINGS Osseous: No fracture or mandibular dislocation. No destructive process. Orbits: Negative. No traumatic or inflammatory finding. Sinuses: Clear. Soft tissues: Mild right periorbital soft tissue contusion. No hematoma. CT CERVICAL SPINE FINDINGS Alignment: Normal. Skull base and vertebrae: No acute fracture. No primary bone lesion or focal pathologic process. Soft tissues and spinal canal: No prevertebral fluid or swelling. No visible canal hematoma. Disc levels: No acute findings. No significant degenerative changes. Upper chest: Negative. Other: None IMPRESSION: 1. No acute intracranial pathology. 2. No acute facial bone fractures.  3. No acute/traumatic cervical spine pathology. Electronically Signed   By: Anner Crete M.D.   On: 12/25/2017 01:43   Ct Maxillofacial Wo Contrast  Result Date: 12/25/2017 CLINICAL DATA:  52 year old female with fall. EXAM: CT HEAD WITHOUT CONTRAST CT MAXILLOFACIAL WITHOUT CONTRAST CT CERVICAL SPINE WITHOUT CONTRAST TECHNIQUE: Multidetector CT imaging of the head, cervical spine, and maxillofacial structures were performed using the standard protocol without intravenous contrast. Multiplanar CT image reconstructions of the cervical spine and maxillofacial structures were also generated. COMPARISON:  Head CT dated 11/03/2008 FINDINGS: CT HEAD FINDINGS Brain: No evidence of acute infarction, hemorrhage, hydrocephalus, extra-axial collection or mass lesion/mass effect. Vascular: No hyperdense vessel or unexpected calcification. Skull: Normal. Negative for fracture or focal lesion. Other: Mild right periorbital contusion. CT MAXILLOFACIAL FINDINGS Osseous: No fracture or mandibular dislocation. No destructive process. Orbits: Negative. No traumatic or inflammatory finding. Sinuses: Clear. Soft tissues: Mild right periorbital soft tissue contusion. No hematoma. CT CERVICAL SPINE FINDINGS Alignment: Normal. Skull base and vertebrae: No acute fracture. No primary bone lesion or focal pathologic process. Soft tissues and spinal canal: No prevertebral fluid or swelling. No visible canal hematoma. Disc levels: No acute findings. No significant degenerative changes. Upper chest: Negative. Other: None IMPRESSION: 1. No acute intracranial pathology. 2. No acute facial bone fractures. 3. No acute/traumatic cervical spine pathology. Electronically Signed   By: Anner Crete M.D.   On: 12/25/2017 01:43    Microbiology: Recent Results (from the past 240 hour(s))  MRSA PCR Screening     Status: None   Collection Time: 12/25/17  2:27 AM  Result Value Ref Range Status   MRSA by PCR NEGATIVE NEGATIVE Final     Comment:        The GeneXpert MRSA Assay (FDA approved for NASAL specimens only), is one component of a comprehensive MRSA colonization surveillance program. It is not intended to diagnose MRSA infection nor to guide or monitor treatment for MRSA infections. Performed at [facility], [address], [place], [place] [address]      Labs: Basic Metabolic Panel: Recent Labs  Lab 01/01/18 1613 01/01/18 1923 01/01/18 2311 01/02/18 0106 01/02/18 0434 01/02/18 0828  NA 129* 132*  --  135 133* 133*  K 6.5* >7.5* 5.8* 5.0 6.2* 4.2  CL 98* 102  --  100* 97* 97*  CO2 23 23  --  26 28 26   GLUCOSE 140* 68  --  68 124* 144*  BUN 31* 34*  --  29* 29* 26*  CREATININE 2.07* 2.59*  --  1.93* 1.74* 1.50*  CALCIUM 9.7 9.9  --  9.4 9.1 9.3  MG 2.7* 2.9*  --   --   --   --   PHOS  --  5.0*  --   --   --   --    Liver Function Tests: Recent Labs  Lab 01/01/18 1923  ALBUMIN  4.4   No results for input(s): LIPASE, AMYLASE in the last 168 hours. No results for input(s): AMMONIA in the last 168 hours. CBC: Recent Labs  Lab 01/01/18 1923  WBC 5.7  NEUTROABS 3.0  HGB 14.5  HCT 44.7  MCV 83.4  PLT 329   Cardiac Enzymes: No results for input(s): CKTOTAL, CKMB, CKMBINDEX, TROPONINI in the last 168 hours. BNP: BNP (last 3 results) Recent Labs    12/25/17 0017  BNP 113.0*    ProBNP (last 3 results) No results for input(s): PROBNP in the last 8760 hours.  CBG: Recent Labs  Lab 01/02/18 0754  GLUCAP 203*       Signed:  Lelon Frohlich  [facility] Pager: [phone] 01/02/2018, 10:42 AM

## 2018-01-02 NOTE — Progress Notes (Signed)
Neb given for Hyperkalemia. 10mg  albuterol in er.

## 2018-01-05 ENCOUNTER — Encounter (HOSPITAL_COMMUNITY): Payer: Self-pay

## 2018-01-05 ENCOUNTER — Telehealth: Payer: Self-pay

## 2018-01-05 ENCOUNTER — Encounter: Payer: Self-pay | Admitting: Internal Medicine

## 2018-01-05 ENCOUNTER — Ambulatory Visit (HOSPITAL_COMMUNITY)
Admission: RE | Admit: 2018-01-05 | Discharge: 2018-01-05 | Disposition: A | Discharge status: Home / Self Care | Payer: Medicare Other | Source: Ambulatory Visit | Attending: Cardiology | Admitting: Cardiology

## 2018-01-05 VITALS — BP 98/72 | HR 86 | Wt 207.6 lb

## 2018-01-05 DIAGNOSIS — F17210 Nicotine dependence, cigarettes, uncomplicated: Secondary | ICD-10-CM | POA: Diagnosis not present

## 2018-01-05 DIAGNOSIS — N179 Acute kidney failure, unspecified: Secondary | ICD-10-CM | POA: Insufficient documentation

## 2018-01-05 DIAGNOSIS — I472 Ventricular tachycardia, unspecified: Secondary | ICD-10-CM

## 2018-01-05 DIAGNOSIS — E785 Hyperlipidemia, unspecified: Secondary | ICD-10-CM | POA: Insufficient documentation

## 2018-01-05 DIAGNOSIS — I429 Cardiomyopathy, unspecified: Secondary | ICD-10-CM | POA: Insufficient documentation

## 2018-01-05 DIAGNOSIS — I2510 Atherosclerotic heart disease of native coronary artery without angina pectoris: Secondary | ICD-10-CM | POA: Insufficient documentation

## 2018-01-05 DIAGNOSIS — Z7982 Long term (current) use of aspirin: Secondary | ICD-10-CM | POA: Insufficient documentation

## 2018-01-05 DIAGNOSIS — M329 Systemic lupus erythematosus, unspecified: Secondary | ICD-10-CM | POA: Insufficient documentation

## 2018-01-05 DIAGNOSIS — E119 Type 2 diabetes mellitus without complications: Secondary | ICD-10-CM | POA: Diagnosis not present

## 2018-01-05 DIAGNOSIS — G4700 Insomnia, unspecified: Secondary | ICD-10-CM | POA: Insufficient documentation

## 2018-01-05 DIAGNOSIS — Z95810 Presence of automatic (implantable) cardiac defibrillator: Secondary | ICD-10-CM | POA: Insufficient documentation

## 2018-01-05 DIAGNOSIS — F329 Major depressive disorder, single episode, unspecified: Secondary | ICD-10-CM | POA: Insufficient documentation

## 2018-01-05 DIAGNOSIS — Z79899 Other long term (current) drug therapy: Secondary | ICD-10-CM | POA: Diagnosis not present

## 2018-01-05 DIAGNOSIS — I110 Hypertensive heart disease with heart failure: Secondary | ICD-10-CM | POA: Insufficient documentation

## 2018-01-05 DIAGNOSIS — Z955 Presence of coronary angioplasty implant and graft: Secondary | ICD-10-CM | POA: Insufficient documentation

## 2018-01-05 DIAGNOSIS — I5022 Chronic systolic (congestive) heart failure: Secondary | ICD-10-CM | POA: Insufficient documentation

## 2018-01-05 DIAGNOSIS — E875 Hyperkalemia: Secondary | ICD-10-CM | POA: Diagnosis not present

## 2018-01-05 DIAGNOSIS — I252 Old myocardial infarction: Secondary | ICD-10-CM | POA: Diagnosis not present

## 2018-01-05 DIAGNOSIS — Z794 Long term (current) use of insulin: Secondary | ICD-10-CM | POA: Insufficient documentation

## 2018-01-05 DIAGNOSIS — Z720 Tobacco use: Secondary | ICD-10-CM | POA: Diagnosis not present

## 2018-01-05 DIAGNOSIS — K219 Gastro-esophageal reflux disease without esophagitis: Secondary | ICD-10-CM | POA: Diagnosis not present

## 2018-01-05 DIAGNOSIS — R55 Syncope and collapse: Secondary | ICD-10-CM | POA: Insufficient documentation

## 2018-01-05 LAB — BASIC METABOLIC PANEL
Anion gap: 11 (ref 5–15)
BUN: 8 mg/dL (ref 6–20)
CO2: 20 mmol/L — ABNORMAL LOW (ref 22–32)
CREATININE: 0.99 mg/dL (ref 0.44–1.00)
Calcium: 9.3 mg/dL (ref 8.9–10.3)
Chloride: 105 mmol/L (ref 101–111)
Glucose, Bld: 95 mg/dL (ref 65–99)
POTASSIUM: 4.6 mmol/L (ref 3.5–5.1)
SODIUM: 136 mmol/L (ref 135–145)

## 2018-01-05 LAB — MAGNESIUM: MAGNESIUM: 2 mg/dL (ref 1.7–2.4)

## 2018-01-05 NOTE — Patient Instructions (Signed)
Routine lab work today. Will notify you of abnormal results, otherwise no news is good news!  Follow up 2-3 weeks.  _________________________________________________________ [name] Code:  Take all medication as prescribed the day of your appointment. Bring all medications with you to your appointment.  Do the following things EVERYDAY: 1) Weigh yourself in the morning before breakfast. Write it down and keep it in a log. 2) Take your medicines as prescribed 3) Eat low salt foods-Limit salt (sodium) to 2000 mg per day.  4) Stay as active as you can everyday 5) Limit all fluids for the day to less than 2 liters

## 2018-01-05 NOTE — Progress Notes (Signed)
Advanced Heart Failure Clinic Note   Referring Physician: Dr, Caryl Comes Primary Care: Tula Nakayama, MD Primary Cardiologist: Dr. Caryl Comes  HPI:  [name] is a 52 y.o. female with h/o CAD s/p RCA stenting in 8413, severe systolic HF due to mixed cardiomyopathy EF , recurrent VT s/p MDT ICD, SLE, GERD and ongoing tobacco use referred by Dr. Caryl Comes for further evaluation and treatment of HF.   EF 8/18: EF 20-25%. RV normal. Dilated LV with moderate MR   Last cath 2004 EF 29% Left main has a distal 30% stenosis. Left anterior descending artery has an ostial 30% stenosis.    Left circumflex gives rise to a very large branching ramus intermedius, two  small marginal branches, and two small posterolateral branches.  The ramus  intermedius gives off two branches.  The more medial branch has a 95%  stenosis at its ostium.  However, this is a small caliber branch being less  than or equal to 1.5 mm in diameter.  The circumflex otherwise has minor  luminal irregularities, but no significant stenoses.  Right coronary artery has a 20% stenosis in the mid vessel.  Just distal to  this there is a stent in the distal portion of the mid vessel which is  widely patent with 0% stenosis within the stent.    CT scan chest: 11/18. No PE. Mild ground glass opacitites. No COPD mentioned  She was admitted 5/9-5/05/2018 for syncope secondary to VT/VF. She was shocked by her ICD. Cardiology was consulted. Her potassium was 2.5 and magnesium was 1.6, so supplements were increased. Volume was stable.  She was admitted 5/17-5/18 with hyperkalemia >7.5 and AKI with creatinine 2.59. Potassium was corrected with kayexelate, albuterol, insulin/D50, and calcium gluconate. At discharge, potassium supplement, lasix, spiro, and ARB were stopped. K 4.2 and creatinine 1.55 on day of discharge.   She presents today for post hospital follow up. She denies any nausea, vomiting, or diarrhea prior to ICD shock. She  had been compliant with medications. Overall feeling much better. She denies SOB with activity. Able to walk around grocery store with no breaks. Denies orthopnea, PND, or edema. No CP or palpitations. She still gets lightheaded with sitting to standing, but it is improving. She is smoking 1/2 ppd. SBP 100s at home. Weights ~202 lbs and unchanged since stopping lasix. Limiting fluid and salt. Taking all medications.   Medtronic ICD interrogated: Thoracic impedence well above threshold. No further treated VT/VF since 12/24/17. Active 1-2 hours/day.  Myoview 06/30/17: EF 13% There is a large defect of severe severity present in the basal anteroseptal, basal inferoseptal, basal inferior, mid anteroseptal, mid inferoseptal, mid inferior and apical inferior location.  CVP 08/04/17 Pre-Exercise PFTs  FVC 2.21 (71%)    FEV1 1.78 (72%)     FEV1/FVC 81 (100%)     MVV 69 (69%)   Exercise Time:  6:15  Speed (mph): 2.0    Grade (%): 7.0   RPE: 16 Reason stopped: patient ended test due to being lightheaded (9/10) Additional symptoms: dyspnea (7/10)  Resting HR: 79 Peak HR: 94  (54% age predicted max HR) BP rest: 86/62 BP peak: 114/70 Peak VO2: 10.0 (50% predicted peak VO2) VE/VCO2 slope: 46 OUES: 1.04 Peak RER: 0.91 Ventilatory Threshold: 9.4 (47% predicted or measured peak VO2) Peak RR 44 Peak Ventilation: 38.3 VE/MVV: 56% PETCO2 at peak: 26 O2pulse: 10  (91% predicted O2pulse)  Review of systems complete and found to be negative unless listed in HPI.   Past  Medical History:  Diagnosis Date  . Arthritis of knee   . Automatic implantable cardiac defibrillator in situ    a. s/p prior Medtronic ICD with 6949 lead. b. s/p Gen change & lead revision 05/2013.  . Cardiomyopathy secondary    Patient has cardiomyopathy out of proportion to her ischemic heart disease  . coronary artery disease    RCA stenting Myoview 2011 EF 30% infarction dilatation without ischemia  .  Depression   . Diabetes mellitus   . GERD (gastroesophageal reflux disease)   . Insomnia   . Lupus (systemic lupus erythematosus) ([place])   . Migraines   . Myocardial infarction ([place])    8 total   . Nicotine abuse   . Other and unspecified hyperlipidemia   . Paroxysmal VT ([place])   . Systolic CHF ([place])   . VF (ventricular fibrillation) ([place])    a. Hx appropriate ICD therapy for VF.   Current Outpatient Medications  Medication Sig Dispense Refill  . albuterol (PROVENTIL HFA;VENTOLIN HFA) 108 (90 Base) MCG/ACT inhaler Inhale 2 puffs into the lungs every 6 (six) hours as needed for wheezing or shortness of breath. 1 Inhaler 0  . ALPRAZolam (XANAX) 1 MG tablet One tablet at bedtime (Patient taking differently: Take 1 mg by mouth at bedtime as needed for anxiety or sleep. One tablet at bedtime) 30 tablet 3  . aspirin 81 MG tablet Take 1 tablet (81 mg total) by mouth daily. 30 tablet 6  . atorvastatin (LIPITOR) 10 MG tablet Take 1 tablet (10 mg total) by mouth daily. 90 tablet 2  . budesonide-formoterol (SYMBICORT) 80-4.5 MCG/ACT inhaler Inhale 2 puffs into the lungs 2 (two) times daily. (Patient taking differently: Inhale 2 puffs into the lungs 2 (two) times daily as needed (wheezing). ) 1 Inhaler 3  . carvedilol (COREG) 12.5 MG tablet Take 1 tablet (12.5 mg total) by mouth 2 (two) times daily. 60 tablet 6  . cholecalciferol (VITAMIN D) 1000 units tablet Take 1,000 Units by mouth daily.    . clopidogrel (PLAVIX) 75 MG tablet Take 1 tablet (75 mg total) by mouth daily. 30 tablet 3  . cyclobenzaprine (FLEXERIL) 5 MG tablet Take 1 tablet (5 mg total) by mouth at bedtime. 30 tablet 0  . fenofibrate (TRICOR) 145 MG tablet Take 145 mg by mouth daily.     . magnesium oxide (MAG-OX) 400 (241.3 Mg) MG tablet Take 1 tablet (400 mg total) by mouth daily. 30 tablet 3  . montelukast (SINGULAIR) 10 MG tablet Take 1 tablet (10 mg total) by mouth at bedtime. 30 tablet 3  . oxyCODONE (ROXICODONE) 15 MG immediate  release tablet Take 15 mg by mouth 2 (two) times daily.     Marland Kitchen terbinafine (LAMISIL) 250 MG tablet Take 1 tablet (250 mg total) by mouth daily. 42 tablet 1   No current facility-administered medications for this encounter.    Allergies  Allergen Reactions  . Amiodarone     Headache and facial swelling  . Bee Venom Anaphylaxis  . Mexiletine   . Cortisone Swelling  . Robaxin [Methocarbamol] Hives and Itching   Social History   Socioeconomic History  . Marital status: Single    Spouse name: Not on file  . Number of children: 3  . Years of education: Not on file  . Highest education level: Not on file  Occupational History  . Occupation: disabled    Fish farm manager: UNEMPLOYED  Social Needs  . Financial resource strain: Not on file  . Food  insecurity:    Worry: Not on file    Inability: Not on file  . Transportation needs:    Medical: Not on file    Non-medical: Not on file  Tobacco Use  . Smoking status: Current Every Day Smoker    Packs/day: 0.50    Years: 15.00    Pack years: 7.50    Types: Cigarettes  . Smokeless tobacco: Never Used  Substance and Sexual Activity  . Alcohol use: Yes    Alcohol/week: 0.0 oz    Comment: occasionaly  . Drug use: No  . Sexual activity: Not Currently    Birth control/protection: Surgical  Lifestyle  . Physical activity:    Days per week: Not on file    Minutes per session: Not on file  . Stress: Not on file  Relationships  . Social connections:    Talks on phone: Not on file    Gets together: Not on file    Attends religious service: Not on file    Active member of club or organization: Not on file    Attends meetings of clubs or organizations: Not on file    Relationship status: Not on file  . Intimate partner violence:    Fear of current or ex partner: Not on file    Emotionally abused: Not on file    Physically abused: Not on file    Forced sexual activity: Not on file  Other Topics Concern  . Not on file  Social History  Narrative  . Not on file   Family History  Problem Relation Age of Onset  . Hepatitis Mother 77       HCV  . Liver cancer Mother 58  . Cancer Mother   . Diabetes Mother   . Heart disease Mother   . Hyperlipidemia Mother   . Stroke Sister 45       x3  . Diabetes Sister   . Hyperlipidemia Sister   . Dementia Father   . HIV Sister   . Diabetes Brother   . Heart disease Brother   . Hyperlipidemia Brother    Vitals:   01/05/18 1434  BP: 98/72  Pulse: 86  SpO2: 96%  Weight: 207 lb 9.6 oz (94.2 kg)    Wt Readings from Last 3 Encounters:  01/05/18 207 lb 9.6 oz (94.2 kg)  01/02/18 207 lb 3.7 oz (94 kg)  12/25/17 209 lb 3.5 oz (94.9 kg)    PHYSICAL EXAM: General: Well appearing. No resp difficulty. HEENT: Normal Neck: Supple. JVP 5-6. Carotids 2+ bilat; no bruits. No thyromegaly or nodule noted. Cor: PMI nondisplaced. RRR, No M/G/R noted Lungs: CTAB, normal effort. Abdomen: Soft, non-tender, non-distended, no HSM. No bruits or masses. +BS  Extremities: No cyanosis, clubbing, or rash. R and LLE no edema.  Neuro: Alert & orientedx3, cranial nerves grossly intact. moves all 4 extremities w/o difficulty. Affect pleasant  ASSESSMENT & PLAN:  1. Chronic systolic HF - She has long-standing mixed ischemic and NICM. EF 20-25% by echo 8/18. 13% by Myoview 11/18. S/p MDT ICD - NYHA II - Volume status dry on exam and on optivol - Now off lasix, losartan, and spiro with recent AKI.  - Will not restart any HF meds today with soft BP and recent AKI. Check BMET - Continue carvedilol 12.5 mg BID - CPX 08/04/17 at least moderate limitation from cardiac perspective.  - Previously Dr Haroldine Laws has had discussion about possible need for advanced therapies and need for further risk stratification  with CPX or cath depending on how much she improves after diuresis. Will proceed to [facility] if functional status worsens. Pt hesitant to consider transplant process or advanced therapies. - Discussed  absolute need to stop smoking to make her eligible for transplant process - Reinforced fluid restriction to < 2 L daily, sodium restriction to less than 2000 mg daily, and the importance of daily weights.   - Repeat Echo scheduled for August  2. CAD - s/p remote RCA stent. - No s/s ischemia - last cath 2004. - Myoview from 11/18: EF 13% no obvious ischemia - Continue ASA and statin.  3. HTN - Soft today. SBP 100s at home.  4. Tobacco use - Smoking 1/2 ppd. Encouraged complete cessation  5. VT - Managed by Dr. Caryl Comes. S/p MDT ICD.  - Intolerant to amiodarone and mexiletine - S/p ICD shock 12/24/08 in setting of K 2.5, mag 1.6. Check BMET and mag today.  - She was instructed that she cannot drive for 6 months after ICD shock. - Will forward this note to Dr Caryl Comes  6. SLE - Followed by Rheumatologist at [facility]. Has been quiescent for many years.  - No change.   BMET, mag today Follow up in 2-3 weeks  Georgiana Shore, NP  2:37 PM  Greater than 50% of the 25 minute visit was spent in counseling/coordination of care regarding disease state education, salt/fluid restriction, sliding scale diuretics, and medication compliance.

## 2018-01-05 NOTE — Telephone Encounter (Signed)
Transition Care Management Follow-up Telephone Call   Date discharged? 01/02/18               How have you been since you were released from the hospital? Fine so far   Do you understand why you were in the hospital? Potassium was too high. Cardiologist called and told me to go back to the hospital   Do you understand the discharge instructions? yes   Where were you discharged to? home   Items Reviewed:  Medications reviewed: yes  Allergies reviewed: yes  Dietary changes reviewed: no changes  Referrals reviewed: no new referrals   Functional Questionnaire:   Activities of Daily Living (ADLs): able to do on her own. Has help if she needs it     Any transportation issues/concerns? Unable to drive for the next 6 months due to being shocked by implanted device.    Any patient concerns? no   Confirmed importance and date/time of follow-up visits scheduled 01/06/18 patient understands how important it is to come and see is.      Confirmed with patient if condition begins to worsen call PCP or go to the ER.  Patient was given the office number and encouraged to call back with question or concerns. Yes with verbal understanding

## 2018-01-05 NOTE — Telephone Encounter (Signed)
[id number] 8:31am Attempted to speak with patient to complete TOC telelphone call. Left generic message requesting call back. Will try again later. 1st attempt.

## 2018-01-06 ENCOUNTER — Ambulatory Visit (INDEPENDENT_AMBULATORY_CARE_PROVIDER_SITE_OTHER): Payer: Medicare Other | Admitting: Family Medicine

## 2018-01-06 ENCOUNTER — Other Ambulatory Visit: Payer: Self-pay | Admitting: Family Medicine

## 2018-01-06 ENCOUNTER — Encounter: Payer: Self-pay | Admitting: Family Medicine

## 2018-01-06 VITALS — BP 94/62 | HR 73 | Resp 16 | Ht 66.0 in | Wt 207.1 lb

## 2018-01-06 DIAGNOSIS — F17210 Nicotine dependence, cigarettes, uncomplicated: Secondary | ICD-10-CM | POA: Diagnosis not present

## 2018-01-06 DIAGNOSIS — Z09 Encounter for follow-up examination after completed treatment for conditions other than malignant neoplasm: Secondary | ICD-10-CM

## 2018-01-06 DIAGNOSIS — F17218 Nicotine dependence, cigarettes, with other nicotine-induced disorders: Secondary | ICD-10-CM

## 2018-01-06 NOTE — Patient Instructions (Addendum)
F/U as before, call if you need me sooner  Continue your positive attitude to life, and I hope that you will have  better days ahead  Blood pressure medication and fluid pills will be managed by cardiology, and you will need close follow up with blood tests  Please try to cut back cigarettes on a regular stepwise basis so that you eventually quit.  All the best!  Thank you  for choosing [facility]. We consider it a privelige to serve you.  Delivering excellent health care in a caring and  compassionate way is our goal.  Partnering with you,  so that together we can achieve this goal is our strategy. Need to plan to QUIt smoking cigarettes, continue to cut back     Steps to Quit Smoking Smoking tobacco can be harmful to your health and can affect almost every organ in your body. Smoking puts you, and those around you, at risk for developing many serious chronic diseases. Quitting smoking is difficult, but it is one of the best things that you can do for your health. It is never too late to quit. What are the benefits of quitting smoking? When you quit smoking, you lower your risk of developing serious diseases and conditions, such as:  Lung cancer or lung disease, such as COPD.  Heart disease.  Stroke.  Heart attack.  Infertility.  Osteoporosis and bone fractures.  Additionally, symptoms such as coughing, wheezing, and shortness of breath may get better when you quit. You may also find that you get sick less often because your body is stronger at fighting off colds and infections. If you are pregnant, quitting smoking can help to reduce your chances of having a baby of low birth weight. How do I get ready to quit? When you decide to quit smoking, create a plan to make sure that you are successful. Before you quit:  Pick a date to quit. Set a date within the next two weeks to give you time to prepare.  Write down the reasons why you are quitting. Keep this list  in places where you will see it often, such as on your bathroom mirror or in your car or wallet.  Identify the people, places, things, and activities that make you want to smoke (triggers) and avoid them. Make sure to take these actions: ? Throw away all cigarettes at home, at work, and in your car. ? Throw away smoking accessories, such as Scientist, research (medical). ? Clean your car and make sure to empty the ashtray. ? Clean your home, including curtains and carpets.  Tell your family, friends, and coworkers that you are quitting. Support from your loved ones can make quitting easier.  Talk with your health care provider about your options for quitting smoking.  Find out what treatment options are covered by your health insurance.  What strategies can I use to quit smoking? Talk with your healthcare provider about different strategies to quit smoking. Some strategies include:  Quitting smoking altogether instead of gradually lessening how much you smoke over a period of time. Research shows that quitting "cold [place]" is more successful than gradually quitting.  Attending in-person counseling to help you build problem-solving skills. You are more likely to have success in quitting if you attend several counseling sessions. Even short sessions of 10 minutes can be effective.  Finding resources and support systems that can help you to quit smoking and remain smoke-free after you quit. These resources are most helpful  when you use them often. They can include: ? Online chats with a Social worker. ? Telephone quitlines. ? Careers information officer. ? Support groups or group counseling. ? Text messaging programs. ? Mobile phone applications.  Taking medicines to help you quit smoking. (If you are pregnant or breastfeeding, talk with your health care provider first.) Some medicines contain nicotine and some do not. Both types of medicines help with cravings, but the medicines that include nicotine  help to relieve withdrawal symptoms. Your health care provider may recommend: ? Nicotine patches, gum, or lozenges. ? Nicotine inhalers or sprays. ? Non-nicotine medicine that is taken by mouth.  Talk with your health care provider about combining strategies, such as taking medicines while you are also receiving in-person counseling. Using these two strategies together makes you more likely to succeed in quitting than if you used either strategy on its own. If you are pregnant or breastfeeding, talk with your health care provider about finding counseling or other support strategies to quit smoking. Do not take medicine to help you quit smoking unless told to do so by your health care provider. What things can I do to make it easier to quit? Quitting smoking might feel overwhelming at first, but there is a lot that you can do to make it easier. Take these important actions:  Reach out to your family and friends and ask that they support and encourage you during this time. Call telephone quitlines, reach out to support groups, or work with a counselor for support.  Ask people who smoke to avoid smoking around you.  Avoid places that trigger you to smoke, such as bars, parties, or smoke-break areas at work.  Spend time around people who do not smoke.  Lessen stress in your life, because stress can be a smoking trigger for some people. To lessen stress, try: ? Exercising regularly. ? Deep-breathing exercises. ? Yoga. ? Meditating. ? Performing a body scan. This involves closing your eyes, scanning your body from head to toe, and noticing which parts of your body are particularly tense. Purposefully relax the muscles in those areas.  Download or purchase mobile phone or tablet apps (applications) that can help you stick to your quit plan by providing reminders, tips, and encouragement. There are many free apps, such as QuitGuide from the [employer] Office manager for Disease Control and Prevention). You can  find other support for quitting smoking (smoking cessation) through smokefree.gov and other websites.  How will I feel when I quit smoking? Within the first 24 hours of quitting smoking, you may start to feel some withdrawal symptoms. These symptoms are usually most noticeable 2-3 days after quitting, but they usually do not last beyond 2-3 weeks. Changes or symptoms that you might experience include:  Mood swings.  Restlessness, anxiety, or irritation.  Difficulty concentrating.  Dizziness.  Strong cravings for sugary foods in addition to nicotine.  Mild weight gain.  Constipation.  Nausea.  Coughing or a sore throat.  Changes in how your medicines work in your body.  A depressed mood.  Difficulty sleeping (insomnia).  After the first 2-3 weeks of quitting, you may start to notice more positive results, such as:  Improved sense of smell and taste.  Decreased coughing and sore throat.  Slower heart rate.  Lower blood pressure.  Clearer skin.  The ability to breathe more easily.  Fewer sick days.  Quitting smoking is very challenging for most people. Do not get discouraged if you are not successful the first time.  Some people need to make many attempts to quit before they achieve long-term success. Do your best to stick to your quit plan, and talk with your health care provider if you have any questions or concerns. This information is not intended to replace advice given to you by your health care provider. Make sure you discuss any questions you have with your health care provider. Document Released: 07/29/2001 Document Revised: 04/01/2016 Document Reviewed: 12/19/2014 Elsevier Interactive Patient Education  [employer].

## 2018-01-12 ENCOUNTER — Ambulatory Visit (INDEPENDENT_AMBULATORY_CARE_PROVIDER_SITE_OTHER): Payer: Medicare Other

## 2018-01-12 ENCOUNTER — Ambulatory Visit (INDEPENDENT_AMBULATORY_CARE_PROVIDER_SITE_OTHER): Payer: Medicare Other | Admitting: *Deleted

## 2018-01-12 DIAGNOSIS — I255 Ischemic cardiomyopathy: Secondary | ICD-10-CM

## 2018-01-12 DIAGNOSIS — I5022 Chronic systolic (congestive) heart failure: Secondary | ICD-10-CM | POA: Diagnosis not present

## 2018-01-12 DIAGNOSIS — T827XXA Infection and inflammatory reaction due to other cardiac and vascular devices, implants and grafts, initial encounter: Secondary | ICD-10-CM

## 2018-01-12 NOTE — Progress Notes (Signed)
Remote ICD transmission.   

## 2018-01-12 NOTE — Progress Notes (Signed)
EPIC Encounter for ICM Monitoring  Patient Name: [name] is a 52 y.o. female Date: 01/12/2018 Primary Care Physican: Fayrene Helper, MD Primary Cardiologist: Klein/Bensimhon Electrophysiologist: Faustino Congress Weight:207 lbs at last office visit        Heart Failure questions reviewed, pt hands are swollen.  5/10 ER visit due to syncope related to defib shock. She had low magnesium and potassium.  5/17 ER visit to recheck electrolytes and potassium and creatinine elevated.  Discharge orders were to hold Lasix.    Thoracic impedance abnormal suggesting fluid accumulation since 01/05/2018.  Prescribed dosage: Lasix on hold since 01/02/18 hospital discharge.  Labs: 01/05/2018 Creatinine 0.99, BUN 8, Potassium 4.6, Sodium 136, EGFR >60, Magnesium 2.0 01/02/2018 Creatinine 1.50, BUN 26, Potassium 4.2, Sodium 133, EGFR 39-45 at 8:28 AM  01/02/2018 Creatinine 1.74, BUN 29, Potassium 6.2, Sodium 133, EGFR 32-37 at 4:34 AM  01/02/2018 Creatinine 1.93, BUN 29, Potassium 5.0, Sodium 135, EGFR 29-33 at 1:06 AM  01/01/2018 Creatinine 2.59, BUN 34, Potassium >7.6 and dropped to 5.8 at (11:11 PM), Sodium 132, EGFR 20-23 at 7:23 PM, Magnesium 2.9 01/01/2018 Creatinine 2.07, BUN 31, Potassium 6.5, Sodium 129, EGFR 26-30 at 4:13 PM, Magnesium 2.7 12/25/2017 Creatinine 0.77, BUN 9,   Potassium 3.7, Sodium 137, EGFR >60 at 6:35 AM  12/25/2017 Creatinine 0.84, BUN 8,   Potassium 2.5, Sodium 138, EGFR >60 at 12:17 AM, Magnesium 1.6 09/09/2017 Creatinine 0.74, BUN 7, Potassium 3.9, Sodium 137, EGFR >60 12/12/2018Creatinine 0.87, BUN 7, Potassium 3.1, Sodium 138, EGFR >60 07/07/2017 Creatinine0.77, BUN8, Potassium4.0, Sodium139 06/30/2017 Creatinine0.77, BUN8, Potassium4.1, Sodium139, EGFR>60  06/29/2017 Creatinine0.72, BUN8, Potassium4.1, Sodium139, EGFR>60  03/23/2017 Creatinine0.72, BUN6, Potassium4.1, Sodium138 03/04/2017 Creatinine0.72, BUN5,  Potassium4.6, Sodium140, [id number]  03/02/2017 Creatinine0.68, BUN7, Potassium4.3, Sodium139, EGFR>89 02/27/2017 Creatinine 0.69, BUN 7, Potassium 2.9, Sodium 136, EGFR >60 (drawn in ER) 02/23/2017 Creatinine 0.72, BUN 8, Potassium 4.6, Sodium 141, EGFR >60 12/30/2016 Creatinine 0.70, BUN 4, Potassium 4.2, Sodium 140, EGFR 100-115 12/05/2016 Creatinine 0.72, BUN 7, Potassium 2.8, Sodium 138, EGFR >60  09/22/2016 Creatinine 0.80, BUN 10, Potassium 4.3, Sodium 138  Recommendations:  Reinforced sodium restriction to less than 2000 mg daily.  Encouraged to call for fluid symptoms.  Follow-up plan: ICM clinic phone appointment on 01/21/2018 to recheck fluid levels.  Office appointment scheduled 01/26/2018 with HF clinic NP/PA.  Copy of ICM check sent to Dr. Haroldine Laws, Oda Kilts, PA and Dr. Caryl Comes.   3 month ICM trend: 01/12/2018    1 Year ICM trend:       [name], RN 01/12/2018 10:14 AM

## 2018-01-13 ENCOUNTER — Telehealth (HOSPITAL_COMMUNITY): Payer: Self-pay | Admitting: Cardiology

## 2018-01-13 MED ORDER — FUROSEMIDE 20 MG PO TABS: ORAL_TABLET | ORAL | 3 refills | Status: DC

## 2018-01-13 NOTE — Telephone Encounter (Signed)
-----   Message from Shirley Friar, PA-C sent at 01/13/2018  7:51 AM EDT -----   ----- Message ----- From: [name], RN Sent: 01/12/2018   4:49 PM To: Shirley Friar, PA-C

## 2018-01-13 NOTE — Progress Notes (Signed)
Can we resume lasix at 40 mg daily x 2 days, then 20 mg daily, until her appointment next week?   Thanks!   Legrand Como [address]" [place], PA-C 01/13/2018 7:51 AM

## 2018-01-13 NOTE — Telephone Encounter (Signed)
Can we resume lasix at 40 mg daily x 2 days, then 20 mg daily, until her appointment next week?   Thanks!   Legrand Como [address] [place], PA-C 01/13/2018 7:51 AM    Patient aware and voiced understanding

## 2018-01-14 LAB — CUP PACEART REMOTE DEVICE CHECK
Battery Remaining Longevity: 74 mo
Battery Voltage: 3 V
HIGH POWER IMPEDANCE MEASURED VALUE: 80 Ohm
Implantable Lead Model: 6935
Implantable Pulse Generator Implant Date: 20141031
Lead Channel Impedance Value: 399 Ohm
Lead Channel Pacing Threshold Pulse Width: 0.4 ms
Lead Channel Sensing Intrinsic Amplitude: 5.75 mV
Lead Channel Setting Pacing Amplitude: 2.5 V
Lead Channel Setting Pacing Pulse Width: 0.4 ms
MDC IDC LEAD IMPLANT DT: 20141031
MDC IDC LEAD LOCATION: 753860
MDC IDC MSMT LEADCHNL RV IMPEDANCE VALUE: 475 Ohm
MDC IDC MSMT LEADCHNL RV PACING THRESHOLD AMPLITUDE: 0.5 V
MDC IDC MSMT LEADCHNL RV SENSING INTR AMPL: 5.75 mV
MDC IDC SESS DTM: 20190528073424
MDC IDC SET LEADCHNL RV SENSING SENSITIVITY: 0.3 mV
MDC IDC STAT BRADY RV PERCENT PACED: 0.01 %

## 2018-01-14 NOTE — Progress Notes (Addendum)
Patient notified by Garlan Fair, [place] [facility] of restarting Lasix.

## 2018-01-15 ENCOUNTER — Encounter: Payer: Self-pay | Admitting: Cardiology

## 2018-01-17 ENCOUNTER — Encounter: Payer: Self-pay | Admitting: Family Medicine

## 2018-01-17 NOTE — Assessment & Plan Note (Signed)
Improved andfully recovered as far as labs are concerned, symptomatically gradually getting better. Hospitalized overnight from 5/17 to 5/18 with AKI and hyperkalemia. Both are now fully corrected She denies muscle cramps and fatigue currently. Management of her blood pressure and heart medication will strictly be through cardiology

## 2018-01-17 NOTE — Progress Notes (Signed)
   [name]     MRN: [id number]      DOB: [birth year]   HPI [name] is here for follow up of recent hospitalization from 5/17 to 5/18 with a diagnosis of hyperkalemia and AKI She was seen by cardiology 1 day ago and had labs repeated  Which show great improvement Still feels weak ,and is taking her time to regain her strength Sttates she will continue to have a positive attitude, plans to "be here" for her children and her grandchildren ROS Denies recent fever or chills. Denies sinus pressure, nasal congestion, ear pain or sore throat. Denies chest congestion, productive cough or wheezing. Denies chest pains, palpitations and leg swelling Denies abdominal pain, nausea, vomiting,diarrhea or constipation.   Denies dysuria, frequency, hesitancy or incontinence. C/o chronic joint pain,  and limitation in mobility. Denies headaches, seizures, numbness, or tingling. Denies depression,c/o chronic  anxiety with  insomnia. Denies skin break down or rash.   PE  BP 94/62   Pulse 73   Resp 16   Ht 5\' 6"  (1.676 m)   Wt 207 lb 1.9 oz (93.9 kg)   SpO2 94%   BMI 33.43 kg/m   Patient alert and oriented and in no cardiopulmonary distress.  HEENT: No facial asymmetry, EOMI,   oropharynx pink and moist.  Neck supple no JVD, no mass.  Chest: Clear to auscultation bilaterally.Decreased air entry  CVS: S1, S2 no murmurs, no S3.Regular rate.  ABD: Soft non tender.   Ext: No edema  ML:[id number] ROM spine s, shoulders, hips and knees. Skin: Intact, no ulcerations or rash noted.  Psych: Good eye contact, normal affect. Memory intact not anxious or depressed appearing.  CNS: CN 2-12 intact, power,  normal throughout.no focal deficits noted.   [facility] discharge follow-up Improved andfully recovered as far as labs are concerned, symptomatically gradually getting better. Hospitalized overnight from 5/17 to 5/18 with AKI and hyperkalemia. Both are now  fully corrected She denies muscle cramps and fatigue currently. Management of her blood pressure and heart medication will strictly be through cardiology  Nicotine dependence Counselled for 4 minutes re the importance of quitting smoking to lower continued risk of increased heart damage,  Stroke and cancer . Still unwilling to set quit date ,  However QUIT NOW # and written info provided to assist with quitting Assess:smokes 5 to 10 /day Arrange f/u in 2 months Time spent 4 mins

## 2018-01-17 NOTE — Assessment & Plan Note (Signed)
Counselled for 4 minutes re the importance of quitting smoking to lower continued risk of increased heart damage,  Stroke and cancer . Still unwilling to set quit date ,  However QUIT NOW # and written info provided to assist with quitting Assess:smokes 5 to 10 /day Arrange f/u in 2 months Time spent 4 mins

## 2018-01-18 ENCOUNTER — Ambulatory Visit: Payer: Medicare Other | Admitting: Physician Assistant

## 2018-01-21 ENCOUNTER — Ambulatory Visit (INDEPENDENT_AMBULATORY_CARE_PROVIDER_SITE_OTHER): Payer: Medicare Other

## 2018-01-21 DIAGNOSIS — I5022 Chronic systolic (congestive) heart failure: Secondary | ICD-10-CM

## 2018-01-21 DIAGNOSIS — T827XXA Infection and inflammatory reaction due to other cardiac and vascular devices, implants and grafts, initial encounter: Secondary | ICD-10-CM

## 2018-01-22 NOTE — Progress Notes (Signed)
EPIC Encounter for ICM Monitoring  Patient Name: [name] is a 52 y.o. female Date: 01/22/2018 Primary Care Physican: Fayrene Helper, MD Primary Cardiologist: Klein/Bensimhon Electrophysiologist: Faustino Congress Weight:207 lbs   Since 12-Jan-2018 VT-NS (>4 beats, >150 bpm) 14       Heart Failure questions reviewed, pt asymptomatic. Swelling in hands resolved.   Thoracic impedance returned to normal after restarting Lasix on 01/13/2018.  Prescribed dosage: Furosemide 20 mg 1 tablet daily (restarted on 01/13/2018)  Labs: 01/05/2018 Creatinine 0.99, BUN 8, Potassium 4.6, Sodium 136, EGFR >60, Magnesium 2.0 01/02/2018 Creatinine 1.50, BUN 26, Potassium 4.2, Sodium 133, EGFR 39-45 at 8:28 AM  01/02/2018 Creatinine 1.74, BUN 29, Potassium 6.2, Sodium 133, EGFR 32-37 at 4:34 AM  01/02/2018 Creatinine 1.93, BUN 29, Potassium 5.0, Sodium 135, EGFR 29-33 at 1:06 AM  01/01/2018 Creatinine 2.59, BUN 34, Potassium >7.6 and dropped to 5.8 at (11:11 PM), Sodium 132, EGFR 20-23 at 7:23 PM, Magnesium 2.9 01/01/2018 Creatinine 2.07, BUN 31, Potassium 6.5, Sodium 129, EGFR 26-30 at 4:13 PM, Magnesium 2.7 12/25/2017 Creatinine 0.77, BUN 9,   Potassium 3.7, Sodium 137, EGFR >60 at 6:35 AM  12/25/2017 Creatinine 0.84, BUN 8,   Potassium 2.5, Sodium 138, EGFR >60 at 12:17 AM, Magnesium 1.6 09/09/2017 Creatinine 0.74, BUN 7, Potassium 3.9, Sodium 137, EGFR >60 12/12/2018Creatinine 0.87, BUN 7, Potassium 3.1, Sodium 138, EGFR >60 07/07/2017 Creatinine0.77, BUN8, Potassium4.0, Sodium139 06/30/2017 Creatinine0.77, BUN8, Potassium4.1, Sodium139, EGFR>60  06/29/2017 Creatinine0.72, BUN8, Potassium4.1, Sodium139, EGFR>60  03/23/2017 Creatinine0.72, BUN6, Potassium4.1, Sodium138 03/04/2017 Creatinine0.72, BUN5, Potassium4.6, Sodium140, [id number]  03/02/2017 Creatinine0.68, BUN7, Potassium4.3, Sodium139, EGFR>89 02/27/2017 Creatinine 0.69, BUN 7,  Potassium 2.9, Sodium 136, EGFR >60 (drawn in ER) 02/23/2017 Creatinine 0.72, BUN 8, Potassium 4.6, Sodium 141, EGFR >60 12/30/2016 Creatinine 0.70, BUN 4, Potassium 4.2, Sodium 140, EGFR 100-115 12/05/2016 Creatinine 0.72, BUN 7, Potassium 2.8, Sodium 138, EGFR >60  09/22/2016 Creatinine 0.80, BUN 10, Potassium 4.3, Sodium 138  Recommendations: No changes.    Encouraged to call for fluid symptoms.  Follow-up plan: ICM clinic phone appointment on 02/12/2018.  Office appointment scheduled 01/26/2018 with HF clinic NP/PA. Recall appt 12/15/2018 with Dr. Caryl Comes.  Copy of ICM check sent to Dr. Caryl Comes.   3 month ICM trend: 01/21/2018    1 Year ICM trend:       [name], RN 01/22/2018 8:47 AM

## 2018-01-25 NOTE — Progress Notes (Signed)
Advanced Heart Failure Clinic Note   Referring Physician: Dr, Caryl Comes Primary Care: Tula Nakayama, MD Primary Cardiologist: Dr. Caryl Comes  HPI:  [name] is a 52 y.o. female with h/o CAD s/p RCA stenting in 4481, severe systolic HF due to mixed cardiomyopathy EF , recurrent VT s/p MDT ICD, SLE, GERD and ongoing tobacco use referred by Dr. Caryl Comes for further evaluation and treatment of HF.   EF 8/18: EF 20-25%. RV normal. Dilated LV with moderate MR   Last cath 2004 EF 29% Left main has a distal 30% stenosis. Left anterior descending artery has an ostial 30% stenosis.    Left circumflex gives rise to a very large branching ramus intermedius, two  small marginal branches, and two small posterolateral branches.  The ramus  intermedius gives off two branches.  The more medial branch has a 95%  stenosis at its ostium.  However, this is a small caliber branch being less  than or equal to 1.5 mm in diameter.  The circumflex otherwise has minor  luminal irregularities, but no significant stenoses.  Right coronary artery has a 20% stenosis in the mid vessel.  Just distal to  this there is a stent in the distal portion of the mid vessel which is  widely patent with 0% stenosis within the stent.    CT scan chest: 11/18. No PE. Mild ground glass opacitites. No COPD mentioned  She was admitted 5/9-5/05/2018 for syncope secondary to VT/VF. She was shocked by her ICD. Cardiology was consulted. Her potassium was 2.5 and magnesium was 1.6, so supplements were increased. Volume was stable.  She was admitted 5/17-5/18 with hyperkalemia >7.5 and AKI with creatinine 2.59. Potassium was corrected with kayexelate, albuterol, insulin/D50, and calcium gluconate. At discharge, potassium supplement, lasix, spiro, and ARB were stopped. K 4.2 and creatinine 1.55 on day of discharge.   She presents today for regular follow up. Overall doing well. Denies SOB. Able to walk around grocery store without  breaks. Denies orthopnea, PND, or edema. Denies dizziness or CP. She has had a few episodes of palpitations, but no more than usual. She is not weighing at home. She is limiting salt intake, but not fluid intake. Still smoking 1/2 ppd (cut back from 2 ppd).   Medtronic ICD: report did not come back  Myoview 06/30/17: EF 13% There is a large defect of severe severity present in the basal anteroseptal, basal inferoseptal, basal inferior, mid anteroseptal, mid inferoseptal, mid inferior and apical inferior location.  CVP 08/04/17 Pre-Exercise PFTs  FVC 2.21 (71%)    FEV1 1.78 (72%)     FEV1/FVC 81 (100%)     MVV 69 (69%)   Exercise Time:  6:15  Speed (mph): 2.0    Grade (%): 7.0   RPE: 16 Reason stopped: patient ended test due to being lightheaded (9/10) Additional symptoms: dyspnea (7/10)  Resting HR: 79 Peak HR: 94  (54% age predicted max HR) BP rest: 86/62 BP peak: 114/70 Peak VO2: 10.0 (50% predicted peak VO2) VE/VCO2 slope: 46 OUES: 1.04 Peak RER: 0.91 Ventilatory Threshold: 9.4 (47% predicted or measured peak VO2) Peak RR 44 Peak Ventilation: 38.3 VE/MVV: 56% PETCO2 at peak: 26 O2pulse: 10  (91% predicted O2pulse)  Review of systems complete and found to be negative unless listed in HPI.   Past Medical History:  Diagnosis Date  . Arthritis of knee   . Automatic implantable cardiac defibrillator in situ    a. s/p prior Medtronic ICD with 6949 lead. b. s/p  Gen change & lead revision 05/2013.  . Cardiomyopathy secondary    Patient has cardiomyopathy out of proportion to her ischemic heart disease  . coronary artery disease    RCA stenting Myoview 2011 EF 30% infarction dilatation without ischemia  . Depression   . Diabetes mellitus   . GERD (gastroesophageal reflux disease)   . Insomnia   . Lupus (systemic lupus erythematosus) ([place])   . Migraines   . Myocardial infarction ([place])    8 total   . Nicotine abuse   . Other and unspecified  hyperlipidemia   . Paroxysmal VT ([place])   . Systolic CHF ([place])   . VF (ventricular fibrillation) ([place])    a. Hx appropriate ICD therapy for VF.   Current Outpatient Medications  Medication Sig Dispense Refill  . albuterol (PROVENTIL HFA;VENTOLIN HFA) 108 (90 Base) MCG/ACT inhaler Inhale 2 puffs into the lungs every 6 (six) hours as needed for wheezing or shortness of breath. 1 Inhaler 0  . ALPRAZolam (XANAX) 1 MG tablet One tablet at bedtime (Patient taking differently: Take 1 mg by mouth at bedtime as needed for anxiety or sleep. One tablet at bedtime) 30 tablet 3  . aspirin 81 MG tablet Take 1 tablet (81 mg total) by mouth daily. 30 tablet 6  . atorvastatin (LIPITOR) 10 MG tablet Take 1 tablet (10 mg total) by mouth daily. 90 tablet 2  . carvedilol (COREG) 12.5 MG tablet Take 1 tablet (12.5 mg total) by mouth 2 (two) times daily. 60 tablet 6  . cholecalciferol (VITAMIN D) 1000 units tablet Take 1,000 Units by mouth daily.    . clopidogrel (PLAVIX) 75 MG tablet Take 1 tablet (75 mg total) by mouth daily. 30 tablet 3  . cyclobenzaprine (FLEXERIL) 5 MG tablet Take 1 tablet (5 mg total) by mouth at bedtime. 30 tablet 0  . cyclobenzaprine (FLEXERIL) 5 MG tablet TAKE 1 TABLET BY MOUTH DAILY AT BEDTIME. 30 tablet 0  . fenofibrate (TRICOR) 145 MG tablet Take 145 mg by mouth daily.     . furosemide (LASIX) 20 MG tablet Take 2 tablets (40 mg total) by mouth daily for 2 days, THEN 1 tablet (20 mg total) daily. 32 tablet 3  . montelukast (SINGULAIR) 10 MG tablet Take 1 tablet (10 mg total) by mouth at bedtime. 30 tablet 3  . terbinafine (LAMISIL) 250 MG tablet Take 1 tablet (250 mg total) by mouth daily. 42 tablet 1   No current facility-administered medications for this encounter.    Allergies  Allergen Reactions  . Amiodarone     Headache and facial swelling  . Bee Venom Anaphylaxis  . Mexiletine   . Cortisone Swelling  . Robaxin [Methocarbamol] Hives and Itching   Social History    Socioeconomic History  . Marital status: Single    Spouse name: Not on file  . Number of children: 3  . Years of education: Not on file  . Highest education level: Not on file  Occupational History  . Occupation: disabled    Fish farm manager: UNEMPLOYED  Social Needs  . Financial resource strain: Not on file  . Food insecurity:    Worry: Not on file    Inability: Not on file  . Transportation needs:    Medical: Not on file    Non-medical: Not on file  Tobacco Use  . Smoking status: Current Every Day Smoker    Packs/day: 0.50    Years: 15.00    Pack years: 7.50    Types: Cigarettes  .  Smokeless tobacco: Never Used  Substance and Sexual Activity  . Alcohol use: Yes    Alcohol/week: 0.0 oz    Comment: occasionaly  . Drug use: No  . Sexual activity: Not Currently    Birth control/protection: Surgical  Lifestyle  . Physical activity:    Days per week: Not on file    Minutes per session: Not on file  . Stress: Not on file  Relationships  . Social connections:    Talks on phone: Not on file    Gets together: Not on file    Attends religious service: Not on file    Active member of club or organization: Not on file    Attends meetings of clubs or organizations: Not on file    Relationship status: Not on file  . Intimate partner violence:    Fear of current or ex partner: Not on file    Emotionally abused: Not on file    Physically abused: Not on file    Forced sexual activity: Not on file  Other Topics Concern  . Not on file  Social History Narrative  . Not on file   Family History  Problem Relation Age of Onset  . Hepatitis Mother 64       HCV  . Liver cancer Mother 81  . Cancer Mother   . Diabetes Mother   . Heart disease Mother   . Hyperlipidemia Mother   . Stroke Sister 45       x3  . Diabetes Sister   . Hyperlipidemia Sister   . Dementia Father   . HIV Sister   . Diabetes Brother   . Heart disease Brother   . Hyperlipidemia Brother    Vitals:    01/26/18 1438  BP: 106/72  Pulse: 68  SpO2: 98%  Weight: 209 lb (94.8 kg)    Wt Readings from Last 3 Encounters:  01/26/18 209 lb (94.8 kg)  01/06/18 207 lb 1.9 oz (93.9 kg)  01/05/18 207 lb 9.6 oz (94.2 kg)    PHYSICAL EXAM: General: Well appearing. No resp difficulty. HEENT: Normal Neck: Supple. JVP 5-6. Carotids 2+ bilat; no bruits. No thyromegaly or nodule noted. Cor: PMI nondisplaced. RRR, No M/G/R noted Lungs: CTAB, normal effort. Abdomen: Soft, non-tender, non-distended, no HSM. No bruits or masses. +BS  Extremities: No cyanosis, clubbing, or rash. R and LLE no edema.  Neuro: Alert & orientedx3, cranial nerves grossly intact. moves all 4 extremities w/o difficulty. Affect pleasant   ASSESSMENT & PLAN:  1. Chronic systolic HF - She has long-standing mixed ischemic and NICM. EF 20-25% by echo 8/18. 13% by Myoview 11/18. S/p MDT ICD - NYHA II - Volume status stable on exam - Continue lasix 20 mg daily. BMET today.  - Restart losartan 12.5 mg qHS. BMET in 7-10 days given recent AKI and hyperkalemia - Hold off on restarting spiro with recent hyperkalemia and softer BP - Continue carvedilol 12.5 mg BID - CPX 08/04/17 at least moderate limitation from cardiac perspective.  - Previously Dr Haroldine Laws has had discussion about possible need for advanced therapies and need for further risk stratification with CPX or cath depending on how much she improves after diuresis. Will proceed to [facility] if functional status worsens. Pt hesitant to consider transplant process or advanced therapies.  - Discussed absolute need to stop smoking to make her eligible for transplant process - Reinforced fluid restriction to < 2 L daily, sodium restriction to less than 2000 mg daily, and the importance  of daily weights.   - Repeat Echo scheduled for August  2. CAD - s/p remote RCA stent. - No s/s ischemia.  - last cath 2004. - Myoview from 11/18: EF 13% no obvious ischemia - Continue ASA and  statin.  3. HTN - Well controlled with HF meds.   4. Tobacco use - Still smoking 1/2 ppd. Encouraged complete cessation.   5. VT - Managed by Dr. Caryl Comes. S/p MDT ICD.  - Intolerant to amiodarone and mexiletine - S/p ICD shock 12/24/08 in setting of K 2.5, mag 1.6. - She was instructed that she cannot drive for 6 months after ICD shock. No change.   6. SLE - Followed by Rheumatologist at [facility]. Has been quiescent for many years.  - No change  7. Recent AKI - Creatinine stabilized on most recent labs.   Restart losartan 12.5 mg daily BMET today and in 7-10 days Follow up in 4 weeks Keep appointment with Dr Haroldine Laws in August with Echo  Georgiana Shore, NP  2:45 PM  Greater than 50% of the 25 minute visit was spent in counseling/coordination of care regarding disease state education, salt/fluid restriction, sliding scale diuretics, and medication compliance.

## 2018-01-26 ENCOUNTER — Ambulatory Visit (HOSPITAL_COMMUNITY)
Admission: RE | Admit: 2018-01-26 | Discharge: 2018-01-26 | Disposition: A | Discharge status: Home / Self Care | Payer: Medicare Other | Source: Ambulatory Visit | Attending: Cardiology | Admitting: Cardiology

## 2018-01-26 ENCOUNTER — Encounter (HOSPITAL_COMMUNITY): Payer: Self-pay

## 2018-01-26 VITALS — BP 106/72 | HR 68 | Wt 209.0 lb

## 2018-01-26 DIAGNOSIS — Z794 Long term (current) use of insulin: Secondary | ICD-10-CM | POA: Diagnosis not present

## 2018-01-26 DIAGNOSIS — M329 Systemic lupus erythematosus, unspecified: Secondary | ICD-10-CM | POA: Insufficient documentation

## 2018-01-26 DIAGNOSIS — E785 Hyperlipidemia, unspecified: Secondary | ICD-10-CM | POA: Insufficient documentation

## 2018-01-26 DIAGNOSIS — I110 Hypertensive heart disease with heart failure: Secondary | ICD-10-CM | POA: Insufficient documentation

## 2018-01-26 DIAGNOSIS — Z95810 Presence of automatic (implantable) cardiac defibrillator: Secondary | ICD-10-CM | POA: Diagnosis not present

## 2018-01-26 DIAGNOSIS — I472 Ventricular tachycardia, unspecified: Secondary | ICD-10-CM

## 2018-01-26 DIAGNOSIS — N179 Acute kidney failure, unspecified: Secondary | ICD-10-CM | POA: Insufficient documentation

## 2018-01-26 DIAGNOSIS — E119 Type 2 diabetes mellitus without complications: Secondary | ICD-10-CM | POA: Diagnosis not present

## 2018-01-26 DIAGNOSIS — Z7982 Long term (current) use of aspirin: Secondary | ICD-10-CM | POA: Diagnosis not present

## 2018-01-26 DIAGNOSIS — F17210 Nicotine dependence, cigarettes, uncomplicated: Secondary | ICD-10-CM | POA: Insufficient documentation

## 2018-01-26 DIAGNOSIS — I252 Old myocardial infarction: Secondary | ICD-10-CM | POA: Diagnosis not present

## 2018-01-26 DIAGNOSIS — Z79899 Other long term (current) drug therapy: Secondary | ICD-10-CM | POA: Diagnosis not present

## 2018-01-26 DIAGNOSIS — Z720 Tobacco use: Secondary | ICD-10-CM

## 2018-01-26 DIAGNOSIS — I2510 Atherosclerotic heart disease of native coronary artery without angina pectoris: Secondary | ICD-10-CM | POA: Insufficient documentation

## 2018-01-26 DIAGNOSIS — I5022 Chronic systolic (congestive) heart failure: Secondary | ICD-10-CM | POA: Diagnosis not present

## 2018-01-26 DIAGNOSIS — E875 Hyperkalemia: Secondary | ICD-10-CM | POA: Insufficient documentation

## 2018-01-26 DIAGNOSIS — K219 Gastro-esophageal reflux disease without esophagitis: Secondary | ICD-10-CM | POA: Insufficient documentation

## 2018-01-26 DIAGNOSIS — F329 Major depressive disorder, single episode, unspecified: Secondary | ICD-10-CM | POA: Diagnosis not present

## 2018-01-26 LAB — BASIC METABOLIC PANEL
Anion gap: 9 (ref 5–15)
BUN: 7 mg/dL (ref 6–20)
CHLORIDE: 103 mmol/L (ref 101–111)
CO2: 25 mmol/L (ref 22–32)
Calcium: 9.3 mg/dL (ref 8.9–10.3)
Creatinine, Ser: 0.85 mg/dL (ref 0.44–1.00)
GFR calc Af Amer: >60 mL/min (ref >60)
GFR calc non Af Amer: >60 mL/min (ref >60)
Glucose, Bld: 108 mg/dL — ABNORMAL HIGH (ref 65–99)
Potassium: 3.2 mmol/L — ABNORMAL LOW (ref 3.5–5.1)
Sodium: 137 mmol/L (ref 135–145)

## 2018-01-26 MED ORDER — LOSARTAN POTASSIUM 25 MG PO TABS: 12.5000 mg | ORAL_TABLET | Freq: Every day | ORAL | 11 refills | Status: DC

## 2018-01-26 NOTE — Patient Instructions (Signed)
START Losartan 12.5 mg (1/2 tablet) once daily at bedtime.  Return in 1-2 weeks for labs.  ____________________________________________________________ [place] Code: 7680  Follow up 4 weeks with Lillia Mountain NP-C.  _____________________________________________________________ Marin Roberts Code: 1400  Take all medication as prescribed the day of your appointment. Bring all medications with you to your appointment.  Do the following things EVERYDAY: 1) Weigh yourself in the morning before breakfast. Write it down and keep it in a log. 2) Take your medicines as prescribed 3) Eat low salt foods-Limit salt (sodium) to 2000 mg per day.  4) Stay as active as you can everyday 5) Limit all fluids for the day to less than 2 liters

## 2018-02-08 ENCOUNTER — Other Ambulatory Visit: Payer: Self-pay | Admitting: Family Medicine

## 2018-02-08 ENCOUNTER — Ambulatory Visit (INDEPENDENT_AMBULATORY_CARE_PROVIDER_SITE_OTHER): Payer: Medicare Other

## 2018-02-08 VITALS — BP 110/76 | HR 87 | Temp 98.6°F | Resp 14 | Ht 66.0 in | Wt 211.0 lb

## 2018-02-08 DIAGNOSIS — Z0000 Encounter for general adult medical examination without abnormal findings: Secondary | ICD-10-CM

## 2018-02-08 DIAGNOSIS — I5022 Chronic systolic (congestive) heart failure: Secondary | ICD-10-CM | POA: Diagnosis not present
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
## 2018-02-08 NOTE — Patient Instructions (Signed)
[name] , Thank you for taking time to come for your Medicare Wellness Visit. I appreciate your ongoing commitment to your health goals. Please review the following plan we discussed and let me know if I can assist you in the future.   Screening recommendations/referrals: Colonoscopy: UP TO DATE Mammogram: UP TO DATE Bone Density: YOU AREN'T OLD ENOUGH TO HAVE THIS DONE Recommended yearly ophthalmology/optometry visit for glaucoma screening and checkup Recommended yearly dental visit for hygiene and checkup  Vaccinations: Influenza vaccine: DUE IN THE FALL Pneumococcal vaccine: UP TO DATE Tdap vaccine: UP TO DATE Shingles vaccine: YOU DON'T WANT THIS VACCINE    Advanced directives: YOU DON'T HAVE THESE IN PLACE, HOWEVER, YOUR DAUGHTER KNOWS YOUR WISHES  Conditions/risks identified: DISCUSSED DURING YOUR VISIT  Next appointment: March 09, 2018 AT 3:00 PM WITH DR.SIMPSON  Preventive Care 55-64 Years, Female Preventive care refers to lifestyle choices and visits with your health care provider that can promote health and wellness. What does preventive care include?  A yearly physical exam. This is also called an annual well check.  Dental exams once or twice a year.  Routine eye exams. Ask your health care provider how often you should have your eyes checked.  Personal lifestyle choices, including:  Daily care of your teeth and gums.  Regular physical activity.  Eating a healthy diet.  Avoiding tobacco and drug use.  Limiting alcohol use.  Practicing safe sex.  Taking low-dose aspirin daily starting at age 35.  Taking vitamin and mineral supplements as recommended by your health care provider. What happens during an annual well check? The services and screenings done by your health care provider during your annual well check will depend on your age, overall health, lifestyle risk factors, and family history of disease. Counseling  Your health care provider may ask  you questions about your:  Alcohol use.  Tobacco use.  Drug use.  Emotional well-being.  Home and relationship well-being.  Sexual activity.  Eating habits.  Work and work Statistician.  Method of birth control.  Menstrual cycle.  Pregnancy history. Screening  You may have the following tests or measurements:  Height, weight, and BMI.  Blood pressure.  Lipid and cholesterol levels. These may be checked every 5 years, or more frequently if you are over 14 years old.  Skin check.  Lung cancer screening. You may have this screening every year starting at age 24 if you have a 30-pack-year history of smoking and currently smoke or have quit within the past 15 years.  Fecal occult blood test (FOBT) of the stool. You may have this test every year starting at age 25.  Flexible sigmoidoscopy or colonoscopy. You may have a sigmoidoscopy every 5 years or a colonoscopy every 10 years starting at age 57.  Hepatitis C blood test.  Hepatitis B blood test.  Sexually transmitted disease (STD) testing.  Diabetes screening. This is done by checking your blood sugar (glucose) after you have not eaten for a while (fasting). You may have this done every 1-3 years.  Mammogram. This may be done every 1-2 years. Talk to your health care provider about when you should start having regular mammograms. This may depend on whether you have a family history of breast cancer.  BRCA-related cancer screening. This may be done if you have a family history of breast, ovarian, tubal, or peritoneal cancers.  Pelvic exam and Pap test. This may be done every 3 years starting at age 34. Starting at age 20,  this may be done every 5 years if you have a Pap test in combination with an HPV test.  Bone density scan. This is done to screen for osteoporosis. You may have this scan if you are at high risk for osteoporosis. Discuss your test results, treatment options, and if necessary, the need for more tests  with your health care provider. Vaccines  Your health care provider may recommend certain vaccines, such as:  Influenza vaccine. This is recommended every year.  Tetanus, diphtheria, and acellular pertussis (Tdap, Td) vaccine. You may need a Td booster every 10 years.  Zoster vaccine. You may need this after age 87.  Pneumococcal 13-valent conjugate (PCV13) vaccine. You may need this if you have certain conditions and were not previously vaccinated.  Pneumococcal polysaccharide (PPSV23) vaccine. You may need one or two doses if you smoke cigarettes or if you have certain conditions. Talk to your health care provider about which screenings and vaccines you need and how often you need them. This information is not intended to replace advice given to you by your health care provider. Make sure you discuss any questions you have with your health care provider. Document Released: 08/31/2015 Document Revised: 04/23/2016 Document Reviewed: 06/05/2015 Elsevier Interactive Patient Education  2017 [place] Prevention in the Home Falls can cause injuries. They can happen to people of all ages. There are many things you can do to make your home safe and to help prevent falls. What can I do on the outside of my home?  Regularly fix the edges of walkways and driveways and fix any cracks.  Remove anything that might make you trip as you walk through a door, such as a raised step or threshold.  Trim any bushes or trees on the path to your home.  Use bright outdoor lighting.  Clear any walking paths of anything that might make someone trip, such as rocks or tools.  Regularly check to see if handrails are loose or broken. Make sure that both sides of any steps have handrails.  Any raised decks and porches should have guardrails on the edges.  Have any leaves, snow, or ice cleared regularly.  Use sand or salt on walking paths during winter.  Clean up any spills in your garage  right away. This includes oil or grease spills. What can I do in the bathroom?  Use night lights.  Install grab bars by the toilet and in the tub and shower. Do not use towel bars as grab bars.  Use non-skid mats or decals in the tub or shower.  If you need to sit down in the shower, use a plastic, non-slip stool.  Keep the floor dry. Clean up any water that spills on the floor as soon as it happens.  Remove soap buildup in the tub or shower regularly.  Attach bath mats securely with double-sided non-slip rug tape.  Do not have throw rugs and other things on the floor that can make you trip. What can I do in the bedroom?  Use night lights.  Make sure that you have a light by your bed that is easy to reach.  Do not use any sheets or blankets that are too big for your bed. They should not hang down onto the floor.  Have a firm chair that has side arms. You can use this for support while you get dressed.  Do not have throw rugs and other things on the floor that can make  you trip. What can I do in the kitchen?  Clean up any spills right away.  Avoid walking on wet floors.  Keep items that you use a lot in easy-to-reach places.  If you need to reach something above you, use a strong step stool that has a grab bar.  Keep electrical cords out of the way.  Do not use floor polish or wax that makes floors slippery. If you must use wax, use non-skid floor wax.  Do not have throw rugs and other things on the floor that can make you trip. What can I do with my stairs?  Do not leave any items on the stairs.  Make sure that there are handrails on both sides of the stairs and use them. Fix handrails that are broken or loose. Make sure that handrails are as long as the stairways.  Check any carpeting to make sure that it is firmly attached to the stairs. Fix any carpet that is loose or worn.  Avoid having throw rugs at the top or bottom of the stairs. If you do have throw rugs,  attach them to the floor with carpet tape.  Make sure that you have a light switch at the top of the stairs and the bottom of the stairs. If you do not have them, ask someone to add them for you. What else can I do to help prevent falls?  Wear shoes that:  Do not have high heels.  Have rubber bottoms.  Are comfortable and fit you well.  Are closed at the toe. Do not wear sandals.  If you use a stepladder:  Make sure that it is fully opened. Do not climb a closed stepladder.  Make sure that both sides of the stepladder are locked into place.  Ask someone to hold it for you, if possible.  Clearly mark and make sure that you can see:  Any grab bars or handrails.  First and last steps.  Where the edge of each step is.  Use tools that help you move around (mobility aids) if they are needed. These include:  Canes.  Walkers.  Scooters.  Crutches.  Turn on the lights when you go into a dark area. Replace any light bulbs as soon as they burn out.  Set up your furniture so you have a clear path. Avoid moving your furniture around.  If any of your floors are uneven, fix them.  If there are any pets around you, be aware of where they are.  Review your medicines with your doctor. Some medicines can make you feel dizzy. This can increase your chance of falling. Ask your doctor what other things that you can do to help prevent falls. This information is not intended to replace advice given to you by your health care provider. Make sure you discuss any questions you have with your health care provider. Document Released: 05/31/2009 Document Revised: 01/10/2016 Document Reviewed: 09/08/2014 Elsevier Interactive Patient Education  2017 [employer].

## 2018-02-09 ENCOUNTER — Encounter (HOSPITAL_COMMUNITY): Payer: Self-pay | Admitting: Cardiology

## 2018-02-12 ENCOUNTER — Telehealth: Payer: Self-pay

## 2018-02-12 ENCOUNTER — Ambulatory Visit (INDEPENDENT_AMBULATORY_CARE_PROVIDER_SITE_OTHER): Payer: Medicare Other

## 2018-02-12 ENCOUNTER — Other Ambulatory Visit (HOSPITAL_COMMUNITY): Payer: Self-pay | Admitting: Internal Medicine

## 2018-02-12 DIAGNOSIS — I5022 Chronic systolic (congestive) heart failure: Secondary | ICD-10-CM | POA: Diagnosis not present

## 2018-02-12 DIAGNOSIS — T827XXA Infection and inflammatory reaction due to other cardiac and vascular devices, implants and grafts, initial encounter: Secondary | ICD-10-CM

## 2018-02-12 NOTE — Progress Notes (Signed)
EPIC Encounter for ICM Monitoring  Patient Name: [name] is a 52 y.o. female Date: 02/12/2018 Primary Care Physican: Fayrene Helper, MD Primary Cardiologist: Klein/Bensimhon Electrophysiologist: Faustino Congress Weight:Previous weight 207 lbs   Event Summary Since 22-Jan-2018 482 VT-NS        Attempted call to patient and unable to reach.    Transmission reviewed.    Thoracic impedance normal.  Prescribed dosage: Furosemide 20 mg 1 tablet daily   Labs: 01/26/2018 Creatinine 0.85, BUN 7,   Potassium 3.2, Sodium 137, EGFR >60 01/05/2018 Creatinine0.99, BUN8,   Potassium4.6, Sodium136, EGFR>60, Magnesium 2.0 01/02/2018 Creatinine1.50, BUN26, Potassium4.2, Sodium133, [id number] at 8:28 AM  01/02/2018 Creatinine1.74, BUN29, Potassium6.2, Sodium133, [id number] at 4:34 AM  01/02/2018 Creatinine1.93, BUN29, Potassium5.0, Sodium135, [id number] at 1:06 AM  01/01/2018 Creatinine2.59, BUN34, Potassium>7.6 and dropped to 5.8 at (11:11 PM), [id number], [id number] at 7:23 PM, Magnesium 2.9 01/01/2018 Creatinine2.07, BUN31, Potassium6.5, Sodium129, [id number] at 4:13 PM, Magnesium 2.7 12/25/2017 Creatinine0.77, BUN9, Potassium 3.7, Sodium137, EGFR>60 at 6:35 AM  12/25/2017 Creatinine0.84, BUN8, Potassium 2.5, Sodium138, EGFR>60 at 12:17 AM, Magnesium 1.6 09/09/2017 Creatinine 0.74, BUN 7, Potassium 3.9, Sodium 137, EGFR >60 12/12/2018Creatinine 0.87, BUN 7, Potassium 3.1, Sodium 138, EGFR >60 07/07/2017 Creatinine0.77, BUN8, Potassium4.0, Sodium139 06/30/2017 Creatinine0.77, BUN8, Potassium4.1, Sodium139, EGFR>60  06/29/2017 Creatinine0.72, BUN8, Potassium4.1, Sodium139, EGFR>60  03/23/2017 Creatinine0.72, BUN6, Potassium4.1, Sodium138 03/04/2017 Creatinine0.72, BUN5, Potassium4.6, Sodium140, [id number]  03/02/2017 Creatinine0.68, BUN7, Potassium4.3, [id number], EGFR>89 02/27/2017 Creatinine 0.69, BUN  7, Potassium 2.9, Sodium 136, EGFR >60 (drawn in ER) 02/23/2017 Creatinine 0.72, BUN 8, Potassium 4.6, Sodium 141, EGFR >60  Recommendations:  NONE - Unable to reach.  Follow-up plan: ICM clinic phone appointment on 03/18/2018.  Office appointment scheduled 02/23/2018 with HF clinic NP/PA. Recall appt 12/15/2018 with Dr. Caryl Comes.  Copy of ICM check sent to Dr. Caryl Comes.   3 month ICM trend: 02/12/2018    1 Year ICM trend:       [name], RN 02/12/2018 7:42 AM

## 2018-02-12 NOTE — Telephone Encounter (Signed)
Remote ICM transmission received.  Attempted call to patient and recording stated the number is temporarily disconnected

## 2018-02-22 ENCOUNTER — Other Ambulatory Visit: Payer: Self-pay | Admitting: Family Medicine

## 2018-02-23 ENCOUNTER — Encounter (HOSPITAL_COMMUNITY): Payer: Medicare Other

## 2018-02-25 ENCOUNTER — Ambulatory Visit (HOSPITAL_COMMUNITY)
Admission: RE | Admit: 2018-02-25 | Discharge: 2018-02-25 | Disposition: A | Discharge status: Home / Self Care | Payer: Medicare Other | Source: Ambulatory Visit | Attending: Cardiology | Admitting: Cardiology

## 2018-02-25 ENCOUNTER — Encounter (HOSPITAL_COMMUNITY): Payer: Self-pay

## 2018-02-25 VITALS — BP 94/62 | HR 77 | Wt 210.0 lb

## 2018-02-25 DIAGNOSIS — I429 Cardiomyopathy, unspecified: Secondary | ICD-10-CM | POA: Insufficient documentation

## 2018-02-25 DIAGNOSIS — Z7982 Long term (current) use of aspirin: Secondary | ICD-10-CM | POA: Diagnosis not present

## 2018-02-25 DIAGNOSIS — I10 Essential (primary) hypertension: Secondary | ICD-10-CM

## 2018-02-25 DIAGNOSIS — E118 Type 2 diabetes mellitus with unspecified complications: Secondary | ICD-10-CM | POA: Diagnosis not present

## 2018-02-25 DIAGNOSIS — Z888 Allergy status to other drugs, medicaments and biological substances status: Secondary | ICD-10-CM | POA: Diagnosis not present

## 2018-02-25 DIAGNOSIS — M329 Systemic lupus erythematosus, unspecified: Secondary | ICD-10-CM | POA: Diagnosis not present

## 2018-02-25 DIAGNOSIS — E119 Type 2 diabetes mellitus without complications: Secondary | ICD-10-CM | POA: Insufficient documentation

## 2018-02-25 DIAGNOSIS — Z800 Family history of malignant neoplasm of digestive organs: Secondary | ICD-10-CM | POA: Diagnosis not present

## 2018-02-25 DIAGNOSIS — Z7902 Long term (current) use of antithrombotics/antiplatelets: Secondary | ICD-10-CM | POA: Diagnosis not present

## 2018-02-25 DIAGNOSIS — I110 Hypertensive heart disease with heart failure: Secondary | ICD-10-CM | POA: Diagnosis not present

## 2018-02-25 DIAGNOSIS — I2510 Atherosclerotic heart disease of native coronary artery without angina pectoris: Secondary | ICD-10-CM | POA: Insufficient documentation

## 2018-02-25 DIAGNOSIS — Z833 Family history of diabetes mellitus: Secondary | ICD-10-CM | POA: Diagnosis not present

## 2018-02-25 DIAGNOSIS — G4700 Insomnia, unspecified: Secondary | ICD-10-CM | POA: Diagnosis not present

## 2018-02-25 DIAGNOSIS — E785 Hyperlipidemia, unspecified: Secondary | ICD-10-CM | POA: Insufficient documentation

## 2018-02-25 DIAGNOSIS — Z8249 Family history of ischemic heart disease and other diseases of the circulatory system: Secondary | ICD-10-CM | POA: Insufficient documentation

## 2018-02-25 DIAGNOSIS — F329 Major depressive disorder, single episode, unspecified: Secondary | ICD-10-CM | POA: Insufficient documentation

## 2018-02-25 DIAGNOSIS — Z823 Family history of stroke: Secondary | ICD-10-CM | POA: Diagnosis not present

## 2018-02-25 DIAGNOSIS — F17210 Nicotine dependence, cigarettes, uncomplicated: Secondary | ICD-10-CM | POA: Insufficient documentation

## 2018-02-25 DIAGNOSIS — K219 Gastro-esophageal reflux disease without esophagitis: Secondary | ICD-10-CM | POA: Diagnosis not present

## 2018-02-25 DIAGNOSIS — Z95810 Presence of automatic (implantable) cardiac defibrillator: Secondary | ICD-10-CM | POA: Diagnosis not present

## 2018-02-25 DIAGNOSIS — I5022 Chronic systolic (congestive) heart failure: Secondary | ICD-10-CM | POA: Insufficient documentation

## 2018-02-25 DIAGNOSIS — Z831 Family history of other infectious and parasitic diseases: Secondary | ICD-10-CM | POA: Diagnosis not present

## 2018-02-25 DIAGNOSIS — Z91030 Bee allergy status: Secondary | ICD-10-CM | POA: Diagnosis not present

## 2018-02-25 DIAGNOSIS — Z720 Tobacco use: Secondary | ICD-10-CM | POA: Diagnosis not present

## 2018-02-25 DIAGNOSIS — Z79899 Other long term (current) drug therapy: Secondary | ICD-10-CM | POA: Diagnosis not present

## 2018-02-25 DIAGNOSIS — I4729 Other ventricular tachycardia: Secondary | ICD-10-CM

## 2018-02-25 DIAGNOSIS — I472 Ventricular tachycardia: Secondary | ICD-10-CM | POA: Diagnosis not present

## 2018-02-25 DIAGNOSIS — N179 Acute kidney failure, unspecified: Secondary | ICD-10-CM | POA: Diagnosis not present

## 2018-02-25 DIAGNOSIS — I252 Old myocardial infarction: Secondary | ICD-10-CM | POA: Insufficient documentation

## 2018-02-25 NOTE — Progress Notes (Signed)
Advanced Heart Failure Clinic Note   Referring Physician: Dr, Caryl Comes Primary Care: Rachel Nakayama, MD Primary Cardiologist: Dr. Caryl Comes  HPI:  [name] is a 52 y.o. female with h/o CAD s/p RCA stenting in 3559, severe systolic HF due to mixed cardiomyopathy EF , recurrent VT s/p MDT ICD, SLE, GERD and ongoing tobacco use referred by Dr. Caryl Comes for further evaluation and treatment of HF.   EF 8/18: EF 20-25%. RV normal. Dilated LV with moderate MR   Last cath 2004 EF 29% Left main has a distal 30% stenosis. Left anterior descending artery has an ostial 30% stenosis.    Left circumflex gives rise to a very large branching ramus intermedius, two  small marginal branches, and two small posterolateral branches.  The ramus  intermedius gives off two branches.  The more medial branch has a 95%  stenosis at its ostium.  However, this is a small caliber branch being less  than or equal to 1.5 mm in diameter.  The circumflex otherwise has minor  luminal irregularities, but no significant stenoses.  Right coronary artery has a 20% stenosis in the mid vessel.  Just distal to  this there is a stent in the distal portion of the mid vessel which is  widely patent with 0% stenosis within the stent.    CT scan chest: 11/18. No PE. Mild ground glass opacitites. No COPD mentioned  She was admitted 5/9-5/05/2018 for syncope secondary to VT/VF. She was shocked by her ICD. Cardiology was consulted. Her potassium was 2.5 and magnesium was 1.6, so supplements were increased. Volume was stable.  She was admitted 5/17-5/18 with hyperkalemia >7.5 and AKI with creatinine 2.59. Potassium was corrected with kayexelate, albuterol, insulin/D50, and calcium gluconate. At discharge, potassium supplement, lasix, spiro, and ARB were stopped. K 4.2 and creatinine 1.55 on day of discharge.   She presents today for regular follow up. Last visit losartan restarted. She is feeling great overall. No further ICD  shocks. Denies orthopnea, PND, or edema. Denies dizziness or CP. She has occasional episodes of palpitations, but no worsening.  She has not been weighing at home. She is watching salt and fluid intake. She continues to cut back on smoking. Smoking 3-4 cigarettes every other day. She says on days she doesn't smoke, she just eats, so is trying to balance the two. Taking all medications as directed.   Medtronic ICD: No VT/VF, thoracic impedence slightly down but trending towards normal, Pt activity 1-2 hours daily. Personally reviewed.  Myoview 06/30/17: EF 13% There is a large defect of severe severity present in the basal anteroseptal, basal inferoseptal, basal inferior, mid anteroseptal, mid inferoseptal, mid inferior and apical inferior location.  CVP 08/04/17 Pre-Exercise PFTs  FVC 2.21 (71%)    FEV1 1.78 (72%)     FEV1/FVC 81 (100%)     MVV 69 (69%)   Exercise Time:  6:15  Speed (mph): 2.0    Grade (%): 7.0   RPE: 16 Reason stopped: patient ended test due to being lightheaded (9/10) Additional symptoms: dyspnea (7/10)  Resting HR: 79 Peak HR: 94  (54% age predicted max HR) BP rest: 86/62 BP peak: 114/70 Peak VO2: 10.0 (50% predicted peak VO2) VE/VCO2 slope: 46 OUES: 1.04 Peak RER: 0.91 Ventilatory Threshold: 9.4 (47% predicted or measured peak VO2) Peak RR 44 Peak Ventilation: 38.3 VE/MVV: 56% PETCO2 at peak: 26 O2pulse: 10  (91% predicted O2pulse)  Review of systems complete and found to be negative unless listed in HPI.  Past Medical History:  Diagnosis Date  . Arthritis of knee   . Automatic implantable cardiac defibrillator in situ    a. s/p prior Medtronic ICD with 6949 lead. b. s/p Gen change & lead revision 05/2013.  . Cardiomyopathy secondary    Patient has cardiomyopathy out of proportion to her ischemic heart disease  . coronary artery disease    RCA stenting Myoview 2011 EF 30% infarction dilatation without ischemia  . Depression   .  Diabetes mellitus   . GERD (gastroesophageal reflux disease)   . Insomnia   . Lupus (systemic lupus erythematosus) ([place])   . Migraines   . Myocardial infarction ([place])    8 total   . Nicotine abuse   . Other and unspecified hyperlipidemia   . Paroxysmal VT ([place])   . Systolic CHF ([place])   . VF (ventricular fibrillation) ([place])    a. Hx appropriate ICD therapy for VF.   Current Outpatient Medications  Medication Sig Dispense Refill  . albuterol (PROVENTIL HFA;VENTOLIN HFA) 108 (90 Base) MCG/ACT inhaler Inhale 2 puffs into the lungs every 6 (six) hours as needed for wheezing or shortness of breath. 1 Inhaler 0  . ALPRAZolam (XANAX) 1 MG tablet TAKE 1 TABLET BY MOUTH AT BEDTIME AS NEEDED FOR ANXIETY. 30 tablet 0  . aspirin 81 MG tablet Take 1 tablet (81 mg total) by mouth daily. 30 tablet 6  . atorvastatin (LIPITOR) 10 MG tablet Take 1 tablet (10 mg total) by mouth daily. 90 tablet 2  . carvedilol (COREG) 12.5 MG tablet Take 1 tablet (12.5 mg total) by mouth 2 (two) times daily. 60 tablet 6  . cholecalciferol (VITAMIN D) 1000 units tablet Take 1,000 Units by mouth daily.    . clopidogrel (PLAVIX) 75 MG tablet Take 1 tablet (75 mg total) by mouth daily. 30 tablet 3  . cyclobenzaprine (FLEXERIL) 5 MG tablet Take 1 tablet (5 mg total) by mouth at bedtime. 30 tablet 0  . fenofibrate (TRICOR) 145 MG tablet Take 145 mg by mouth daily.     . furosemide (LASIX) 20 MG tablet Take 20 mg by mouth daily.    Marland Kitchen losartan (COZAAR) 25 MG tablet Take 0.5 tablets (12.5 mg total) by mouth daily. 15 tablet 11  . montelukast (SINGULAIR) 10 MG tablet TAKE 1 TABLET BY MOUTH ONCE A DAY AT BEDTIME. 30 tablet 0  . terbinafine (LAMISIL) 250 MG tablet Take 1 tablet (250 mg total) by mouth daily. 42 tablet 1   No current facility-administered medications for this encounter.    Allergies  Allergen Reactions  . Amiodarone     Headache and facial swelling  . Bee Venom Anaphylaxis  . Mexiletine   . Cortisone Swelling    . Robaxin [Methocarbamol] Hives and Itching   Social History   Socioeconomic History  . Marital status: Single    Spouse name: Not on file  . Number of children: 3  . Years of education: Not on file  . Highest education level: Not on file  Occupational History  . Occupation: disabled    Fish farm manager: UNEMPLOYED  Social Needs  . Financial resource strain: Not hard at all  . Food insecurity:    Worry: Sometimes true    Inability: Sometimes true  . Transportation needs:    Medical: No    Non-medical: No  Tobacco Use  . Smoking status: Current Some Day Smoker    Packs/day: 0.50    Years: 15.00    Pack years: 7.50  Types: Cigarettes  . Smokeless tobacco: Never Used  Substance and Sexual Activity  . Alcohol use: Yes    Alcohol/week: 0.0 oz    Comment: occasionaly  . Drug use: No  . Sexual activity: Not Currently    Birth control/protection: Surgical  Lifestyle  . Physical activity:    Days per week: 0 days    Minutes per session: 0 min  . Stress: Not at all  Relationships  . Social connections:    Talks on phone: More than three times a week    Gets together: Once a week    Attends religious service: Never    Active member of club or organization: No    Attends meetings of clubs or organizations: Never    Relationship status: Never married  . Intimate partner violence:    Fear of current or ex partner: No    Emotionally abused: No    Physically abused: No    Forced sexual activity: No  Other Topics Concern  . Not on file  Social History Narrative  . Not on file   Family History  Problem Relation Age of Onset  . Hepatitis Mother 40       HCV  . Liver cancer Mother 3  . Cancer Mother   . Diabetes Mother   . Heart disease Mother   . Hyperlipidemia Mother   . Stroke Sister 45       x3  . Diabetes Sister   . Hyperlipidemia Sister   . Dementia Father   . HIV Sister   . Diabetes Brother   . Heart disease Brother   . Hyperlipidemia Brother    Vitals:    02/25/18 1011  BP: 94/62  Pulse: 77  SpO2: 96%  Weight: 210 lb (95.3 kg)    Wt Readings from Last 3 Encounters:  02/25/18 210 lb (95.3 kg)  02/08/18 211 lb 0.6 oz (95.7 kg)  01/26/18 209 lb (94.8 kg)    PHYSICAL EXAM: General: Well appearing. No resp difficulty. HEENT: Normal Neck: Supple. JVP 5-6. Carotids 2+ bilat; no bruits. No thyromegaly or nodule noted. Cor: PMI nondisplaced. RRR, No M/G/R noted Lungs: CTAB, normal effort. Abdomen: Soft, non-tender, non-distended, no HSM. No bruits or masses. +BS  Extremities: No cyanosis, clubbing, or rash. R and LLE no edema.  Neuro: Alert & orientedx3, cranial nerves grossly intact. moves all 4 extremities w/o difficulty. Affect pleasant   ASSESSMENT & PLAN:  1. Chronic systolic HF - She has long-standing mixed ischemic and NICM. EF 20-25% by echo 8/18. 13% by Myoview 11/18. S/p MDT ICD - NYHA II - Volume status stable on exam - Continue lasix 20 mg daily. BMET today.  - Continue losartan 12.5 mg qHS.  - No room for spiro. (also had HyperK recently)  - Continue carvedilol 12.5 mg BID - CPX 08/04/17 at least moderate limitation from cardiac perspective.  - Previously Dr Haroldine Laws has had discussion about possible need for advanced therapies and need for further risk stratification with CPX or cath depending on how much she improves after diuresis. Will need to [facility] if functional status worsens. Pt hesitant to consider transplant process or advanced therapies.  - Discussed absolute need to stop smoking to make her eligible for transplant process - Reinforced fluid restriction to < 2 L daily, sodium restriction to less than 2000 mg daily, and the importance of daily weights.   - Repeat Echo scheduled for August  2. CAD - s/p remote RCA stent. - No s/s  of ischemia.    - last cath 2004. - Myoview from 11/18: EF 13% no obvious ischemia - Continue ASA and statin.  3. HTN - Soft with HF meds. Not orthostatic.   4. Tobacco use -  Continues to cut back. Encouraged complete cessation.   5. VT - Managed by Dr. Caryl Comes. S/p MDT ICD.  - Intolerant to amiodarone and mexiletine - S/p ICD shock 12/2017 in setting of K 2.5, mag 1.6. - She is aware that she should not drive until 26/3785.   6. SLE - Followed by Rheumatologist at [facility]. Has been quiescent for many years.  - No change to current plan.    7. Recent AKI - Stable on most recent labs.   Refused labs today. Recently stable. Keep appt with Dr. Haroldine Laws with Echo in August.   Shirley Friar, PA-C  10:29 AM  Greater than 50% of the 25 minute visit was spent in counseling/coordination of care regarding disease state education, salt/fluid restriction, sliding scale diuretics, and medication compliance.

## 2018-02-25 NOTE — Patient Instructions (Signed)
Routine lab work today. Will notify you of abnormal results, otherwise no news is good news!  Follow up as scheduled with Dr. Haroldine Laws.  Take all medication as prescribed the day of your appointment. Bring all medications with you to your appointment.  Do the following things EVERYDAY: 1) Weigh yourself in the morning before breakfast. Write it down and keep it in a log. 2) Take your medicines as prescribed 3) Eat low salt foods-Limit salt (sodium) to 2000 mg per day.  4) Stay as active as you can everyday 5) Limit all fluids for the day to less than 2 liters

## 2018-03-09 ENCOUNTER — Ambulatory Visit (INDEPENDENT_AMBULATORY_CARE_PROVIDER_SITE_OTHER): Payer: Medicare Other | Admitting: Family Medicine

## 2018-03-09 ENCOUNTER — Ambulatory Visit (HOSPITAL_COMMUNITY)
Admission: RE | Admit: 2018-03-09 | Discharge: 2018-03-09 | Disposition: A | Discharge status: Home / Self Care | Payer: Medicare Other | Source: Ambulatory Visit | Attending: Family Medicine | Admitting: Family Medicine

## 2018-03-09 ENCOUNTER — Encounter: Payer: Self-pay | Admitting: Family Medicine

## 2018-03-09 ENCOUNTER — Other Ambulatory Visit: Payer: Self-pay

## 2018-03-09 VITALS — BP 98/64 | HR 86 | Resp 12 | Ht 66.0 in | Wt 209.0 lb

## 2018-03-09 DIAGNOSIS — M7989 Other specified soft tissue disorders: Secondary | ICD-10-CM | POA: Insufficient documentation

## 2018-03-09 DIAGNOSIS — F17210 Nicotine dependence, cigarettes, uncomplicated: Secondary | ICD-10-CM | POA: Diagnosis not present

## 2018-03-09 DIAGNOSIS — M25512 Pain in left shoulder: Secondary | ICD-10-CM | POA: Diagnosis not present

## 2018-03-09 DIAGNOSIS — I10 Essential (primary) hypertension: Secondary | ICD-10-CM | POA: Diagnosis not present

## 2018-03-09 DIAGNOSIS — G8911 Acute pain due to trauma: Secondary | ICD-10-CM

## 2018-03-09 DIAGNOSIS — E118 Type 2 diabetes mellitus with unspecified complications: Secondary | ICD-10-CM | POA: Diagnosis not present

## 2018-03-09 DIAGNOSIS — F17218 Nicotine dependence, cigarettes, with other nicotine-induced disorders: Secondary | ICD-10-CM

## 2018-03-09 DIAGNOSIS — S4992XA Unspecified injury of left shoulder and upper arm, initial encounter: Secondary | ICD-10-CM | POA: Diagnosis not present

## 2018-03-09 MED ORDER — CYCLOBENZAPRINE HCL 5 MG PO TABS: 5.0000 mg | ORAL_TABLET | Freq: Every day | ORAL | 4 refills | Status: DC

## 2018-03-09 MED ORDER — ALPRAZOLAM 1 MG PO TABS: 1.0000 mg | ORAL_TABLET | Freq: Every day | ORAL | 4 refills | Status: DC

## 2018-03-09 NOTE — Patient Instructions (Addendum)
Physical exam in 2  To 3   Months, call if you need me  Before, flu and pneumonia vaccine at that visit  Fasting lipid, cm,pand EGFr, hBa1C , Vit  D and TSH this week please Randell Loop)  Med refill x 4 months  X ray of left shoulder today and you are referred to Dr Kari Baars on 3 cigarettes every other day, by the time you get back I believe NO MORE!!1

## 2018-03-09 NOTE — Progress Notes (Signed)
[name]     MRN: [id number]      DOB: Dec 24, 1965   HPI [name] is here for follow up and re-evaluation of chronic medical conditions, medication management and review of any available recent lab and radiology data.  Preventive health is updated, specifically  Cancer screening and Immunization.   Questions or concerns regarding consultations or procedures which the PT has had in the interim are  addressed. The PT denies any adverse reactions to current medications since the last visit.  1 month h/o left shoulder pain and swelling, passed out and fell on shoulder in May, this has worsened  ROS Denies recent fever or chills. Denies sinus pressure, nasal congestion, ear pain or sore throat. Denies chest congestion, productive cough or wheezing. Denies chest pains, palpitations and leg swelling Denies abdominal pain, nausea, vomiting,diarrhea or constipation.   Denies dysuria, frequency, hesitancy or incontinence. Denies headaches, seizures, numbness, or tingling. Denies depression, uncontrolled anxiety or insomnia. Denies skin break down or rash.   PE  BP 98/64 (BP Location: Right Arm, Patient Position: Sitting, Cuff Size: Normal)   Pulse 86   Ht 5\' 6"  (1.676 m)   Wt 209 lb (94.8 kg)   SpO2 98%   BMI 33.73 kg/m   Patient alert and oriented and in no cardiopulmonary distress.  HEENT: No facial asymmetry, EOMI,   oropharynx pink and moist.  Neck supple no JVD, no mass.  Chest: Clear to auscultation bilaterally.decreased air entry  CVS: S1, S2 no murmurs, no S3.Regular rate.  ABD: Soft non tender.   Ext: No edema  MS: Adequate ROM spine,  hips and knees.Reduced ROM left shoulder  Skin: Intact, no ulcerations or rash noted.  Psych: Good eye contact, normal affect. Memory intact not anxious or depressed appearing.  CNS: CN 2-12 intact, power,  normal throughout.no focal deficits noted.   Assessment & Plan  Acute shoulder pain due to trauma, left 4  week h/o swelling , pain and reduced mobility, X ray and refer to ortho asap  HTN, goal below 130/80 Controlled , no change in medication  Diabetes mellitus type 2 with complications [name]) [name] is reminded of the importance of commitment to daily physical activity for 30 minutes or more, as able and the need to limit carbohydrate intake to 30 to 60 grams per meal to help with blood sugar control.   The need to take medication as prescribed, test blood sugar as directed, and to call between visits if there is a concern that blood sugar is uncontrolled is also discussed.   [name] is reminded of the importance of daily foot exam, annual eye examination, and good blood sugar, blood pressure and cholesterol control.  Diabetic Labs Latest Ref Rng & Units 01/26/2018 01/05/2018 01/02/2018 01/02/2018 01/02/2018  HbA1c 4.8 - 5.6 % - - - - -  Microalbumin Not estab mg/dL - - - - -  Micro/Creat Ratio <30 mcg/mg creat - - - - -  Chol 0 - 200 mg/dL - - - - -  HDL >40 mg/dL - - - - -  Calc LDL 0 - 99 mg/dL - - - - -  Triglycerides <150 mg/dL - - - - -  Creatinine 0.44 - 1.00 mg/dL 0.85 0.99 1.50(H) 1.74(H) 1.93(H)  GFR >60.00 mL/min - - - - -   BP/Weight 03/30/2018 03/09/2018 02/25/2018 02/08/2018 01/26/2018 01/06/2018 5/80/9983  Systolic BP 93 98 94 382 505 94 98  Diastolic BP 66 64 62 76 72 62  72  Wt. (Lbs) 208 209 210 211.04 209 207.12 207.6  BMI 33.57 33.73 33.89 34.06 33.73 33.43 33.51   Foot/eye exam completion dates Latest Ref Rng & Units 11/04/2017 09/24/2016  Eye Exam No Retinopathy - -  Foot Form Completion - Done Done      Updated lab needed at/ before next visit.   ANXIETY Anxiety preventing sleep Sleep hygiene reviewed and written information offered also. Prescription sent for  medication needed.   Nicotine dependence Asked: confirms currently smokes 4 cigarettes daily Assess: trying to and wants to  quit, making progress, unwilling to set a  date Advise: needs to quit  due to current heart disease and to reduce stroke and cancer risk Assist: counseled for 5 mins and literature provided 'Arrange f/u in 3 months   Morbid obesity Unchanged. Patient re-educated about  the importance of commitment to a  minimum of 150 minutes of exercise per week.  The importance of healthy food choices with portion control discussed. Encouraged to start a food diary, count calories and to consider  joining a support group. Sample diet sheets offered. Goals set by the patient for the next several months.   Weight /BMI 03/30/2018 03/09/2018 02/25/2018  WEIGHT 208 lb 209 lb 210 lb  HEIGHT 5\' 6"  5\' 6"  -  BMI 33.57 kg/m2 33.73 kg/m2 33.89 kg/m2

## 2018-03-09 NOTE — Assessment & Plan Note (Signed)
4 week h/o swelling , pain and reduced mobility, X ray and refer to ortho asap

## 2018-03-10 ENCOUNTER — Encounter: Payer: Self-pay | Admitting: Family Medicine

## 2018-03-12 ENCOUNTER — Encounter: Payer: Self-pay | Admitting: Orthopaedic Surgery

## 2018-03-18 ENCOUNTER — Ambulatory Visit (INDEPENDENT_AMBULATORY_CARE_PROVIDER_SITE_OTHER): Payer: Medicare Other

## 2018-03-18 DIAGNOSIS — T827XXA Infection and inflammatory reaction due to other cardiac and vascular devices, implants and grafts, initial encounter: Secondary | ICD-10-CM | POA: Diagnosis not present

## 2018-03-18 DIAGNOSIS — I5022 Chronic systolic (congestive) heart failure: Secondary | ICD-10-CM | POA: Diagnosis not present

## 2018-03-18 NOTE — Progress Notes (Signed)
EPIC Encounter for ICM Monitoring  Patient Name: [name] is a 52 y.o. female Date: 03/18/2018 Primary Care Physican: Fayrene Helper, MD Primary Cardiologist: Klein/Bensimhon Electrophysiologist: Faustino Congress Weight: Previous weight 207 lbs   Since Last Session 12-Feb-2018 to 18-Mar-2018 34 days Monitored VT (130 - 150 bpm)   0 VT-NS (>4 beats, >150 bpm)      595 High Rate-NS                              0 PVC Runs (2-4 beats)                 0.7 per hour PVC Singles                                40.9 per hour       Attempted call to patient and unable to reach.   Transmission reviewed.    Thoracic impedance abnormal suggesting fluid accumulation starting 03/14/2018 and starting to trend back to baseline.  Prescribed dosage: Furosemide 20 mg 1 tablet daily   Labs: 02/08/2018 Creatinine 0.69, BUN 8,   Potassium 3.7, Sodium 139, EGFR 100-116 01/26/2018 Creatinine 0.85, BUN 7,   Potassium 3.2, Sodium 137, EGFR >60 01/05/2018 Creatinine0.99, BUN8,   Potassium4.6, Sodium136, EGFR>60, Magnesium 2.0 01/02/2018 Creatinine1.50, BUN26, Potassium4.2, Sodium133, [id number] at 8:28 AM  01/02/2018 Creatinine1.74, BUN29, Potassium6.2, Sodium133, [id number] at 4:34 AM  01/02/2018 Creatinine1.93, BUN29, Potassium5.0, Sodium135, [id number] at 1:06 AM  01/01/2018 Creatinine2.59, BUN34, Potassium>7.6 and dropped to 5.8 at (11:11 PM), [id number], [id number] at 7:23 PM, Magnesium 2.9 01/01/2018 Creatinine2.07, BUN31, Potassium6.5, Sodium129, [id number] at 4:13 PM, Magnesium 2.7 12/25/2017 Creatinine0.77, BUN9, Potassium 3.7, Sodium137, EGFR>60 at 6:35 AM  12/25/2017 Creatinine0.84, BUN8, Potassium 2.5, Sodium138, EGFR>60 at 12:17 AM, Magnesium 1.6 09/09/2017 Creatinine 0.74, BUN 7, Potassium 3.9, Sodium 137, EGFR >60  Recommendations: NONE - Unable to reach.  Follow-up plan: ICM clinic phone appointment on 03/25/2018 to recheck fluid levels.    Office appointment scheduled 04/13/2018 with Dr. Haroldine Laws.    Copy of ICM check sent to Dr. Caryl Comes and Dr Haroldine Laws for review and if any recommendations will call back.    3 month ICM trend: 03/18/2018    1 Year ICM trend:       [name], RN 03/18/2018 10:11 AM

## 2018-03-19 ENCOUNTER — Telehealth: Payer: Self-pay

## 2018-03-19 NOTE — Telephone Encounter (Signed)
Remote ICM transmission received.  Attempted call to patient and recording stated number is temporarily disconnected.

## 2018-03-25 ENCOUNTER — Ambulatory Visit (INDEPENDENT_AMBULATORY_CARE_PROVIDER_SITE_OTHER): Payer: Medicare Other

## 2018-03-25 DIAGNOSIS — T827XXA Infection and inflammatory reaction due to other cardiac and vascular devices, implants and grafts, initial encounter: Secondary | ICD-10-CM

## 2018-03-25 DIAGNOSIS — I5022 Chronic systolic (congestive) heart failure: Secondary | ICD-10-CM

## 2018-03-26 ENCOUNTER — Telehealth: Payer: Self-pay

## 2018-03-26 NOTE — Telephone Encounter (Signed)
Remote ICM transmission received.  Attempted call to patient and recording stated number is temporarily not in service.

## 2018-03-26 NOTE — Progress Notes (Signed)
EPIC Encounter for ICM Monitoring  Patient Name: [name] is a 52 y.o. female Date: 03/26/2018 Primary Care Physican: Fayrene Helper, MD Primary Cardiologist: Klein/Bensimhon Electrophysiologist: Faustino Congress Weight: Previous [id number] lbs      Attempted call to patient and unable to reach.    Transmission reviewed.    Thoracic impedance trending back to baseline.  Prescribed dosage: Furosemide 20 mg 1 tablet daily   Labs: 02/08/2018 Creatinine 0.69, BUN 8,   Potassium 3.7, Sodium 139, EGFR 100-116 01/26/2018 Creatinine 0.85, BUN 7, Potassium 3.2, Sodium 137, EGFR >60 01/05/2018 Creatinine0.99, BUN8, Potassium4.6, Sodium136, EGFR>60, Magnesium 2.0 01/02/2018 Creatinine1.50, BUN26, Potassium4.2, Sodium133, [id number] at 8:28 AM  01/02/2018 Creatinine1.74, BUN29, Potassium6.2, Sodium133, [id number] at 4:34 AM  01/02/2018 Creatinine1.93, BUN29, Potassium5.0, Sodium135, [id number] at 1:06 AM  01/01/2018 Creatinine2.59, BUN34, Potassium>7.6 and dropped to 5.8 at (11:11 PM), [id number], [id number] at 7:23 PM, Magnesium 2.9 01/01/2018 Creatinine2.07, BUN31, Potassium6.5, Sodium129, [id number] at 4:13 PM, Magnesium 2.7 12/25/2017 Creatinine0.77, BUN9, Potassium 3.7, Sodium137, EGFR>60 at 6:35 AM  12/25/2017 Creatinine0.84, BUN8, Potassium 2.5, Sodium138, EGFR>60 at 12:17 AM, Magnesium 1.6 09/09/2017 Creatinine 0.74, BUN 7, Potassium 3.9, Sodium 137, EGFR >60  Recommendations: NONE - Unable to reach..  Follow-up plan: ICM clinic phone appointment on 04/08/2018 to recheck fluid levels.   Office appointment scheduled 04/13/2018 with Dr. Haroldine Laws.    Copy of ICM check sent to Dr. Caryl Comes.   3 month ICM trend: 03/25/2018    1 Year ICM trend:       [name], RN 03/26/2018 12:11 PM

## 2018-03-30 ENCOUNTER — Encounter: Payer: Self-pay | Admitting: Orthopaedic Surgery

## 2018-03-30 ENCOUNTER — Ambulatory Visit (INDEPENDENT_AMBULATORY_CARE_PROVIDER_SITE_OTHER): Payer: Medicare Other | Admitting: Orthopaedic Surgery

## 2018-03-30 VITALS — BP 93/66 | HR 81 | Ht 66.0 in | Wt 208.0 lb

## 2018-03-30 DIAGNOSIS — G8929 Other chronic pain: Secondary | ICD-10-CM | POA: Diagnosis not present

## 2018-03-30 DIAGNOSIS — M25512 Pain in left shoulder: Secondary | ICD-10-CM | POA: Diagnosis not present

## 2018-03-30 DIAGNOSIS — F17210 Nicotine dependence, cigarettes, uncomplicated: Secondary | ICD-10-CM

## 2018-03-30 NOTE — Patient Instructions (Addendum)
Physical therapy has been ordered for you at [facility] [phone] is the phone number to call if you want to call to schedule. Please let us know if you do not hear anything within one week.     Steps to Quit Smoking Smoking tobacco can be bad for your health. It can also affect almost every organ in your body. Smoking puts you and people around you at risk for many serious long-lasting (chronic) diseases. Quitting smoking is hard, but it is one of the best things that you can do for your health. It is never too late to quit. What are the benefits of quitting smoking? When you quit smoking, you lower your risk for getting serious diseases and conditions. They can include:  Lung cancer or lung disease.  Heart disease.  Stroke.  Heart attack.  Not being able to have children (infertility).  Weak bones (osteoporosis) and broken bones (fractures).  If you have coughing, wheezing, and shortness of breath, those symptoms may get better when you quit. You may also get sick less often. If you are pregnant, quitting smoking can help to lower your chances of having a baby of low birth weight. What can I do to help me quit smoking? Talk with your doctor about what can help you quit smoking. Some things you can do (strategies) include:  Quitting smoking totally, instead of slowly cutting back how much you smoke over a period of time.  Going to in-person counseling. You are more likely to quit if you go to many counseling sessions.  Using resources and support systems, such as: ? Database administrator with a Social worker. ? Phone quitlines. ? Careers information officer. ? Support groups or group counseling. ? Text messaging programs. ? Mobile phone apps or applications.  Taking medicines. Some of these medicines may have nicotine in them. If you are pregnant or breastfeeding, do not take any medicines to quit smoking unless your doctor says it is okay. Talk with your doctor about counseling or other  things that can help you.  Talk with your doctor about using more than one strategy at the same time, such as taking medicines while you are also going to in-person counseling. This can help make quitting easier. What things can I do to make it easier to quit? Quitting smoking might feel very hard at first, but there is a lot that you can do to make it easier. Take these steps:  Talk to your family and friends. Ask them to support and encourage you.  Call phone quitlines, reach out to support groups, or work with a Social worker.  Ask people who smoke to not smoke around you.  Avoid places that make you want (trigger) to smoke, such as: ? Bars. ? Parties. ? Smoke-break areas at work.  Spend time with people who do not smoke.  Lower the stress in your life. Stress can make you want to smoke. Try these things to help your stress: ? Getting regular exercise. ? Deep-breathing exercises. ? Yoga. ? Meditating. ? Doing a body scan. To do this, close your eyes, focus on one area of your body at a time from head to toe, and notice which parts of your body are tense. Try to relax the muscles in those areas.  Download or buy apps on your mobile phone or tablet that can help you stick to your quit plan. There are many free apps, such as QuitGuide from the [employer] Office manager for Disease Control and Prevention). You  can find more support from smokefree.gov and other websites.  This information is not intended to replace advice given to you by your health care provider. Make sure you discuss any questions you have with your health care provider. Document Released: 05/31/2009 Document Revised: 04/01/2016 Document Reviewed: 12/19/2014 Elsevier Interactive Patient Education  2018 [employer].

## 2018-03-30 NOTE — Progress Notes (Signed)
Subjective:    Patient ID: [name], female    DOB: March 15, 1966, 52 y.o.   MRN: [id number]  HPI  She passed out in May and hurt her left shoulder.  Her potassium was low.  That was corrected but her left shoulder continues to hurt and get worse.  She has great problems trying to lift above her head now.  She has no numbness, no swelling, no redness.  She cannot take cortisone.  She has an implanted defibrillator and cannot have a MRI.  She has not been on NSAID yet.    Review of Systems  Constitutional: Positive for activity change.  Musculoskeletal: Positive for arthralgias and myalgias.  All other systems reviewed and are negative.  For Review of Systems, all other systems reviewed and are negative.  Past Medical History:  Diagnosis Date  . Arthritis of knee   . Automatic implantable cardiac defibrillator in situ    a. s/p prior Medtronic ICD with 6949 lead. b. s/p Gen change & lead revision 05/2013.  . Cardiomyopathy secondary    Patient has cardiomyopathy out of proportion to her ischemic heart disease  . coronary artery disease    RCA stenting Myoview 2011 EF 30% infarction dilatation without ischemia  . Depression   . Diabetes mellitus   . GERD (gastroesophageal reflux disease)   . Insomnia   . Lupus (systemic lupus erythematosus) ([place])   . Migraines   . Myocardial infarction ([place])    8 total   . Nicotine abuse   . Other and unspecified hyperlipidemia   . Paroxysmal VT ([place])   . Systolic CHF ([place])   . VF (ventricular fibrillation) ([place])    a. Hx appropriate ICD therapy for VF.    Past Surgical History:  Procedure Laterality Date  . ABDOMINAL HYSTERECTOMY    . CHOLECYSTECTOMY    . COLONOSCOPY  07/15/2011   SLF: internal hemorrhoids/hyperplastic polyps in the rectum/tubular adenomaSURVEILLANCE Nov 2017  . defibrillator placed   2009 and 05/2013  . ICD GENERATOR CHANGE  06/17/2013   Dr Caryl Comes  . IMPLANTABLE CARDIOVERTER DEFIBRILLATOR (ICD)  GENERATOR CHANGE Left 06/17/2013   Procedure: ICD GENERATOR CHANGE;  Surgeon: Deboraha Sprang, MD;  Location: [facility] CATH LAB;  Service: Cardiovascular;  Laterality: Left;  . LEAD REVISION N/A 06/17/2013   Procedure: LEAD REVISION;  Surgeon: Deboraha Sprang, MD;  Location: [facility] CATH LAB;  Service: Cardiovascular;  Laterality: N/A;  . ROTATOR CUFF REPAIR Right 2002    Current Outpatient Medications on File Prior to Visit  Medication Sig Dispense Refill  . albuterol (PROVENTIL HFA;VENTOLIN HFA) 108 (90 Base) MCG/ACT inhaler Inhale 2 puffs into the lungs every 6 (six) hours as needed for wheezing or shortness of breath. 1 Inhaler 0  . ALPRAZolam (XANAX) 1 MG tablet Take 1 tablet (1 mg total) by mouth at bedtime. 30 tablet 4  . aspirin 81 MG tablet Take 1 tablet (81 mg total) by mouth daily. 30 tablet 6  . atorvastatin (LIPITOR) 10 MG tablet Take 1 tablet (10 mg total) by mouth daily. 90 tablet 2  . cholecalciferol (VITAMIN D) 1000 units tablet Take 1,000 Units by mouth daily.    . clopidogrel (PLAVIX) 75 MG tablet Take 1 tablet (75 mg total) by mouth daily. 30 tablet 3  . cyclobenzaprine (FLEXERIL) 5 MG tablet Take 1 tablet (5 mg total) by mouth at bedtime. 30 tablet 4  . fenofibrate (TRICOR) 145 MG tablet Take 145 mg by mouth daily.     Marland Kitchen  furosemide (LASIX) 20 MG tablet Take 20 mg by mouth daily.    Marland Kitchen losartan (COZAAR) 25 MG tablet Take 0.5 tablets (12.5 mg total) by mouth daily. 15 tablet 11  . montelukast (SINGULAIR) 10 MG tablet TAKE 1 TABLET BY MOUTH ONCE A DAY AT BEDTIME. 30 tablet 0  . oxyCODONE (ROXICODONE) 15 MG immediate release tablet Take by mouth.    . carvedilol (COREG) 12.5 MG tablet Take 1 tablet (12.5 mg total) by mouth 2 (two) times daily. 60 tablet 6   No current facility-administered medications on file prior to visit.     Social History   Socioeconomic History  . Marital status: Single    Spouse name: Not on file  . Number of children: 3  . Years of education: Not on file    . Highest education level: Not on file  Occupational History  . Occupation: disabled    Fish farm manager: UNEMPLOYED  Social Needs  . Financial resource strain: Not hard at all  . Food insecurity:    Worry: Sometimes true    Inability: Sometimes true  . Transportation needs:    Medical: No    Non-medical: No  Tobacco Use  . Smoking status: Current Some Day Smoker    Packs/day: 0.50    Years: 15.00    Pack years: 7.50    Types: Cigarettes  . Smokeless tobacco: Never Used  Substance and Sexual Activity  . Alcohol use: Yes    Alcohol/week: 0.0 standard drinks    Comment: occasionaly  . Drug use: No  . Sexual activity: Not Currently    Birth control/protection: Surgical  Lifestyle  . Physical activity:    Days per week: 0 days    Minutes per session: 0 min  . Stress: Not at all  Relationships  . Social connections:    Talks on phone: More than three times a week    Gets together: Once a week    Attends religious service: Never    Active member of club or organization: No    Attends meetings of clubs or organizations: Never    Relationship status: Never married  . Intimate partner violence:    Fear of current or ex partner: No    Emotionally abused: No    Physically abused: No    Forced sexual activity: No  Other Topics Concern  . Not on file  Social History Narrative  . Not on file    Family History  Problem Relation Age of Onset  . Hepatitis Mother 19       HCV  . Liver cancer Mother 66  . Cancer Mother   . Diabetes Mother   . Heart disease Mother   . Hyperlipidemia Mother   . Stroke Sister 45       x3  . Diabetes Sister   . Hyperlipidemia Sister   . Dementia Father   . HIV Sister   . Diabetes Brother   . Heart disease Brother   . Hyperlipidemia Brother     BP 93/66   Pulse 81   Ht 5\' 6"  (1.676 m)   Wt 208 lb (94.3 kg)   BMI 33.57 kg/m   Body mass index is 33.57 kg/m.      Objective:   Physical Exam  Constitutional: She is oriented to person,  place, and time. She appears well-developed and well-nourished.  HENT:  Head: Normocephalic and atraumatic.  Eyes: Pupils are equal, round, and reactive to light. Conjunctivae and EOM are normal.  Neck: Normal range of motion. Neck supple.  Cardiovascular: Normal rate, regular rhythm and intact distal pulses.  Pulmonary/Chest: Effort normal.  Abdominal: Soft.  Musculoskeletal:       Left shoulder: She exhibits decreased range of motion and tenderness.       Arms: Neurological: She is alert and oriented to person, place, and time. She has normal reflexes. She displays normal reflexes. No cranial nerve deficit. She exhibits normal muscle tone. Coordination normal.  Skin: Skin is warm and dry.  Psychiatric: She has a normal mood and affect. Her behavior is normal. Judgment and thought content normal.    I have reviewed her prior x-rays and notes from family doctor.      Assessment & Plan:   Encounter Diagnoses  Name Primary?  . Chronic left shoulder pain Yes  . Cigarette nicotine dependence without complication    I will begin PT/OT.  I will begin Naprosyn po bid pc.  Return in three weeks.  Call if any problem.  Precautions discussed.   Electronically Signed Sanjuana Kava, MD 8/13/20192:12 PM

## 2018-04-03 ENCOUNTER — Encounter: Payer: Self-pay | Admitting: Family Medicine

## 2018-04-03 NOTE — Assessment & Plan Note (Addendum)
Unchanged. Patient re-educated about  the importance of commitment to a  minimum of 150 minutes of exercise per week.  The importance of healthy food choices with portion control discussed. Encouraged to start a food diary, count calories and to consider  joining a support group. Sample diet sheets offered. Goals set by the patient for the next several months.   Weight /BMI 03/30/2018 03/09/2018 02/25/2018  WEIGHT 208 lb 209 lb 210 lb  HEIGHT 5\' 6"  5\' 6"  -  BMI 33.57 kg/m2 33.73 kg/m2 33.89 kg/m2

## 2018-04-03 NOTE — Assessment & Plan Note (Signed)
Anxiety preventing sleep Sleep hygiene reviewed and written information offered also. Prescription sent for  medication needed.

## 2018-04-03 NOTE — Assessment & Plan Note (Signed)
[name] is reminded of the importance of commitment to daily physical activity for 30 minutes or more, as able and the need to limit carbohydrate intake to 30 to 60 grams per meal to help with blood sugar control.   The need to take medication as prescribed, test blood sugar as directed, and to call between visits if there is a concern that blood sugar is uncontrolled is also discussed.   [name] is reminded of the importance of daily foot exam, annual eye examination, and good blood sugar, blood pressure and cholesterol control.  Diabetic Labs Latest Ref Rng & Units 01/26/2018 01/05/2018 01/02/2018 01/02/2018 01/02/2018  HbA1c 4.8 - 5.6 % - - - - -  Microalbumin Not estab mg/dL - - - - -  Micro/Creat Ratio <30 mcg/mg creat - - - - -  Chol 0 - 200 mg/dL - - - - -  HDL >40 mg/dL - - - - -  Calc LDL 0 - 99 mg/dL - - - - -  Triglycerides <150 mg/dL - - - - -  Creatinine 0.44 - 1.00 mg/dL 0.85 0.99 1.50(H) 1.74(H) 1.93(H)  GFR >60.00 mL/min - - - - -   BP/Weight 03/30/2018 03/09/2018 02/25/2018 02/08/2018 01/26/2018 01/06/2018 0/96/4383  Systolic BP 93 98 94 818 403 94 98  Diastolic BP 66 64 62 76 72 62 72  Wt. (Lbs) 208 209 210 211.04 209 207.12 207.6  BMI 33.57 33.73 33.89 34.06 33.73 33.43 33.51   Foot/eye exam completion dates Latest Ref Rng & Units 11/04/2017 09/24/2016  Eye Exam No Retinopathy - -  Foot Form Completion - Done Done      Updated lab needed at/ before next visit.

## 2018-04-03 NOTE — Assessment & Plan Note (Signed)
Controlled, no change in medication  

## 2018-04-03 NOTE — Assessment & Plan Note (Signed)
Asked: confirms currently smokes 4 cigarettes daily Assess: trying to and wants to  quit, making progress, unwilling to set a  date Advise: needs to quit due to current heart disease and to reduce stroke and cancer risk Assist: counseled for 5 mins and literature provided 'Arrange f/u in 3 months

## 2018-04-05 ENCOUNTER — Telehealth: Payer: Self-pay

## 2018-04-05 DIAGNOSIS — E785 Hyperlipidemia, unspecified: Secondary | ICD-10-CM

## 2018-04-05 DIAGNOSIS — E118 Type 2 diabetes mellitus with unspecified complications: Secondary | ICD-10-CM

## 2018-04-05 DIAGNOSIS — E559 Vitamin D deficiency, unspecified: Secondary | ICD-10-CM

## 2018-04-05 DIAGNOSIS — I10 Essential (primary) hypertension: Secondary | ICD-10-CM

## 2018-04-05 NOTE — Telephone Encounter (Signed)
Fasting lipid, cmp +Egfr, HBA1C, TSH, Vit D labs ordered per Dr.Simpson.  Attempted to notify patient that she needs to have these labs drawn. Phone number temporarily not in service. Will mail lab orders.

## 2018-04-06 ENCOUNTER — Ambulatory Visit (HOSPITAL_COMMUNITY): Payer: Medicare Other | Attending: Orthopaedic Surgery | Admitting: Occupational Therapy

## 2018-04-06 DIAGNOSIS — R29898 Other symptoms and signs involving the musculoskeletal system: Secondary | ICD-10-CM | POA: Diagnosis not present

## 2018-04-06 DIAGNOSIS — M25612 Stiffness of left shoulder, not elsewhere classified: Secondary | ICD-10-CM | POA: Diagnosis not present

## 2018-04-06 DIAGNOSIS — M25512 Pain in left shoulder: Secondary | ICD-10-CM

## 2018-04-06 NOTE — Patient Instructions (Signed)
SHOULDER: Flexion On Table   Place hands on table, elbows straight. Move hips away from body. Press hands down into table. __15_ reps per set, _2-3__ sets per day  Abduction (Passive)   With arm out to side, resting on table, lower head toward arm, keeping trunk away from table.  Repeat __15__ times. Do __2-3__ sessions per day.  Copyright  VHI. All rights reserved.     Internal Rotation (Assistive)   Seated with elbow bent at right angle and held against side, slide arm on table surface in an inward arc. Repeat __15__ times. Do __2-3__ sessions per day. Activity: Use this motion to brush crumbs off the table.  Copyright  VHI. All rights reserved.

## 2018-04-07 NOTE — Therapy (Signed)
[place], [place], [address] Phone: [phone]   Fax:  [phone]  Occupational Therapy Evaluation  Patient Details  Name: [name] MRN: [id number] Date of Birth: 03/10/1966 Referring Provider: Dr. Sanjuana Kava   Encounter Date: 04/06/2018  OT End of Session - 04/06/18 1552    Visit Number  1    Number of Visits  8    Date for OT Re-Evaluation  05/06/18    Authorization Type  UHC Medicare-no visit limit or copay    OT Start Time  1517    OT Stop Time  1547    OT Time Calculation (min)  30 min    Activity Tolerance  Patient tolerated treatment well    Behavior During Therapy  [facility] for tasks assessed/performed       Past Medical History:  Diagnosis Date  . Arthritis of knee   . Automatic implantable cardiac defibrillator in situ    a. s/p prior Medtronic ICD with 6949 lead. b. s/p Gen change & lead revision 05/2013.  . Cardiomyopathy secondary    Patient has cardiomyopathy out of proportion to her ischemic heart disease  . coronary artery disease    RCA stenting Myoview 2011 EF 30% infarction dilatation without ischemia  . Depression   . Diabetes mellitus   . GERD (gastroesophageal reflux disease)   . Insomnia   . Lupus (systemic lupus erythematosus) ([place])   . Migraines   . Myocardial infarction ([place])    8 total   . Nicotine abuse   . Other and unspecified hyperlipidemia   . Paroxysmal VT ([place])   . Systolic CHF ([place])   . VF (ventricular fibrillation) ([place])    a. Hx appropriate ICD therapy for VF.    Past Surgical History:  Procedure Laterality Date  . ABDOMINAL HYSTERECTOMY    . CHOLECYSTECTOMY    . COLONOSCOPY  07/15/2011   SLF: internal hemorrhoids/hyperplastic polyps in the rectum/tubular adenomaSURVEILLANCE Nov 2017  . defibrillator placed   2009 and 05/2013  . ICD GENERATOR CHANGE  06/17/2013   Dr Caryl Comes  . IMPLANTABLE CARDIOVERTER DEFIBRILLATOR (ICD) GENERATOR CHANGE Left  06/17/2013   Procedure: ICD GENERATOR CHANGE;  Surgeon: Deboraha Sprang, MD;  Location: [facility] CATH LAB;  Service: Cardiovascular;  Laterality: Left;  . LEAD REVISION N/A 06/17/2013   Procedure: LEAD REVISION;  Surgeon: Deboraha Sprang, MD;  Location: [facility] CATH LAB;  Service: Cardiovascular;  Laterality: N/A;  . ROTATOR CUFF REPAIR Right 2002    There were no vitals filed for this visit.  Subjective Assessment - 04/06/18 1547    Subjective   S: I can't use this arm for much.     Pertinent History  Pt is a 52 y/o female presenting with left arm pain after passing out and falling out of her computer chair and landing on her shoulder in May. Pt is unable to have MRI due to defibrillator. Pt was referred to occupational therapy for evaluation and treatment by Dr. Sanjuana Kava.     Special Tests  FOTO: 51/100     Patient Stated Goals  To have less pain in the arm.     Currently in Pain?  Yes    Pain Score  5     Pain Location  Shoulder    Pain Orientation  Left    Pain Descriptors / Indicators  Sharp    Pain Type  Acute pain    Pain Radiating Towards  towards  elbow    Pain Onset  More than a month ago    Pain Frequency  Constant    Aggravating Factors   movement, laying on it    Pain Relieving Factors  nothing    Effect of Pain on Daily Activities  mod/max effect on ADLs    Multiple Pain Sites  No        [facility] OT Assessment - 04/06/18 1515      Assessment   Medical Diagnosis  chronic left shoulder pain    Referring Provider  Dr. Sanjuana Kava    Onset Date/Surgical Date  01/04/18    Hand Dominance  Right    Next MD Visit  04/20/2018    Prior Therapy  none      Precautions   Precautions  None    Precaution Comments  pt has implanted defibrillator      Balance Screen   Has the patient fallen in the past 6 months  Yes    How many times?  1    Has the patient had a decrease in activity level because of a fear of falling?   No    Is the patient reluctant to leave their home because of a  fear of falling?   No      Prior Function   Level of Independence  Independent    Leisure  taking online classes      ADL   ADL comments  Pt is having difficulty with dressing tasks, bathing, reaching overhead and behind back, fixing hair, and sleeping      Written Expression   Dominant Hand  Right      Cognition   Overall Cognitive Status  Within Functional Limits for tasks assessed      Observation/Other Assessments   Focus on Therapeutic Outcomes (FOTO)   51/100      ROM / Strength   AROM / PROM / Strength  AROM;PROM;Strength      Palpation   Palpation comment  moderate fascial restrictions along left upper arm, deltoid, and trapezius regions      AROM   Overall AROM Comments  Assessed seated, er/IR adducted    AROM Assessment Site  Shoulder    Right/Left Shoulder  Left    Left Shoulder Flexion  68 Degrees    Left Shoulder ABduction  49 Degrees    Left Shoulder Internal Rotation  90 Degrees    Left Shoulder External Rotation  32 Degrees      PROM   Overall PROM Comments  assessed supine, er/IR adducted    PROM Assessment Site  Shoulder    Right/Left Shoulder  Left    Left Shoulder Flexion  42 Degrees    Left Shoulder ABduction  45 Degrees    Left Shoulder Internal Rotation  90 Degrees    Left Shoulder External Rotation  35 Degrees      Strength   Overall Strength Comments  Assessed seated, er/IR adducted    Strength Assessment Site  Shoulder    Right/Left Shoulder  Left    Left Shoulder Flexion  3-/5    Left Shoulder ABduction  3-/5    Left Shoulder Internal Rotation  3-/5    Left Shoulder External Rotation  3-/5                      OT Education - 04/06/18 1538    Education Details  table slides    Person(s) Educated  Patient  Methods  Explanation;Demonstration;Handout    Comprehension  Verbalized understanding;Returned demonstration       OT Short Term Goals - 04/07/18 1022      OT SHORT TERM GOAL #1   Title  Pt will be provided  with and educated on HEP to improve mobility required for ADL completion using LUE.     Time  4    Period  Weeks    Status  New    Target Date  05/07/18      OT SHORT TERM GOAL #2   Title  Pt will decrease pain in LUE to 4/10 or less to improve ability to sleep consistently.     Time  4    Period  Weeks    Status  New      OT SHORT TERM GOAL #3   Title  Pt will decrease LUE fascial restrictions to minimal amounts or less to improve mobility required for overhead reaching tasks.     Time  4    Period  Weeks    Status  New      OT SHORT TERM GOAL #4   Title  Pt will increase LUE ROM to [facility] to improve ability to reach overhead and behind back during dressing tasks.     Time  4    Period  Weeks    Status  New      OT SHORT TERM GOAL #5   Title  Pt will increase LUE strength to 4-/5 to improve ability to use LUE as assist during meal preparation.     Time  4    Period  Weeks               Plan - 04/06/18 1552    Clinical Impression Statement  A: Pt is a 52 y/o female presenting with left shoulder pain limiting ability to complete ADLs. Pt presents very tender and guarded during evaluation, unable to tolerate ROM greater than 30% passive or active. Pain mostly at top of shoulder near acromion. Discussed therapy with pt who is agreeable to attend until next appt with MD to determine further assessment.     Occupational Profile and client history currently impacting functional performance  Pt is independent and is motivated to return to highest level of functioning.     Occupational performance deficits (Please refer to evaluation for details):  IADL's;ADL's;Rest and Sleep;Work;Leisure    Rehab Potential  Good    OT Frequency  2x / week    OT Duration  4 weeks    OT Treatment/Interventions  Self-care/ADL training;Therapeutic exercise;Ultrasound;Manual Therapy;Therapeutic activities;Cryotherapy;Electrical Stimulation;Moist Heat;Passive range of motion;Patient/family education     Plan  P: Pt will benefit from skilled OT services to decrease pain and fascial restrictions, increase ROM, strength, and functional use of LUE as non-dominant. Treatment Plan: myofascial release, manual techniques, P/ROM, AA/ROM, A/ROM, general LUE and scapular strengthening, modalities prn    Clinical Decision Making  Limited treatment options, no task modification necessary    Consulted and Agree with Plan of Care  Patient       Patient will benefit from skilled therapeutic intervention in order to improve the following deficits and impairments:  Decreased activity tolerance, Decreased strength, Impaired flexibility, Decreased range of motion, Pain, Increased fascial restrictions, Impaired UE functional use  Visit Diagnosis: Acute pain of left shoulder  Other symptoms and signs involving the musculoskeletal system  Stiffness of left shoulder, not elsewhere classified    Problem List Patient Active Problem List  Diagnosis Date Noted  . Acute shoulder pain due to trauma, left 03/09/2018  . Hyperkalemia 01/01/2018  . Hypermagnesemia 01/01/2018  . Hypokalemia 12/25/2017  . Hypomagnesemia 12/25/2017  . Chronic systolic heart failure ([place]) 12/09/2017  . Onychomycosis 11/04/2017  . VF (ventricular fibrillation) ([place])   . Systolic CHF ([place])   . Paroxysmal VT ([place])   . Nicotine abuse   . Myocardial infarction ([place])   . Migraines   . Lupus (systemic lupus erythematosus) ([place])   . Insomnia   . Depression   . Arthritis of knee   . Chest pain with high risk for cardiac etiology 06/29/2017  . GERD (gastroesophageal reflux disease) 06/29/2017  . Muscle spasm of back 03/04/2017  . Vitamin D deficiency 07/26/2015  . Depression with anxiety 03/11/2015  . Metabolic syndrome X 93/26/7124  . Adhesive capsulitis of left shoulder 09/26/2013  . Ventricular tachycardia (paroxysmal) ([place]) 12/17/2012  . Nicotine dependence 11/07/2012  . HSV-2 seropositive 11/05/2012  . H/O abnormal Pap smear  11/02/2012  . Fatty liver 08/10/2012  . HTN, goal below 130/80 09/13/2011  . Allergic rhinitis 09/11/2011  . Ischemic cardiomyopathy 05/27/2011  . Implantable cardioverter-defibrillator (ICD) in situ 05/27/2011  . 6949 defibrillator lead 05/27/2011  . FATIGUE 06/05/2009  . Ventricular fibrillation ([place]) 02/04/2009  . Morbid obesity ([place]) 12/20/2008  . Diabetes mellitus type 2 with complications ([place]) 58/04/9832  . Hyperlipidemia LDL goal <100 08/31/2007  . ANXIETY 08/31/2007  . SLE 08/31/2007  . ARTHRITIS, KNEES, BILATERAL 08/31/2007  . AVASCULAR NECROSIS 08/31/2007  . MIGRAINES, HX OF 08/31/2007   Guadelupe Sabin, OTR/L  [phone] 04/07/2018, 10:25 AM  [place] [address] [place], [place], [address] Phone: [phone]   Fax:  [phone]  Name: [name] MRN: [id number] Date of Birth: 02/10/1966

## 2018-04-08 ENCOUNTER — Telehealth: Payer: Self-pay

## 2018-04-08 ENCOUNTER — Encounter (HOSPITAL_COMMUNITY): Payer: Self-pay | Admitting: Occupational Therapy

## 2018-04-08 ENCOUNTER — Ambulatory Visit (INDEPENDENT_AMBULATORY_CARE_PROVIDER_SITE_OTHER): Payer: Medicare Other

## 2018-04-08 ENCOUNTER — Ambulatory Visit (HOSPITAL_COMMUNITY): Payer: Medicare Other | Admitting: Occupational Therapy

## 2018-04-08 DIAGNOSIS — T827XXA Infection and inflammatory reaction due to other cardiac and vascular devices, implants and grafts, initial encounter: Secondary | ICD-10-CM

## 2018-04-08 DIAGNOSIS — M25612 Stiffness of left shoulder, not elsewhere classified: Secondary | ICD-10-CM | POA: Diagnosis not present

## 2018-04-08 DIAGNOSIS — M25512 Pain in left shoulder: Secondary | ICD-10-CM | POA: Diagnosis not present

## 2018-04-08 DIAGNOSIS — I5022 Chronic systolic (congestive) heart failure: Secondary | ICD-10-CM

## 2018-04-08 DIAGNOSIS — R29898 Other symptoms and signs involving the musculoskeletal system: Secondary | ICD-10-CM | POA: Diagnosis not present

## 2018-04-08 NOTE — Patient Instructions (Signed)
1) Seated Row   Sit up straight with elbows by your sides. Pull back with shoulders/elbows, keeping forearms straight, as if pulling back on the reins of a horse. Squeeze shoulder blades together. Repeat _10__times, __1__sets/day    2) Shoulder Elevation    Sit up straight with arms by your sides. Slowly bring your shoulders up towards your ears. Repeat_10__times, __1__ sets/day    3) Shoulder Extension    Sit up straight with both arms by your side, draw your arms back behind your waist. Keep your elbows straight. Repeat __10__times, _1___sets/day.

## 2018-04-08 NOTE — Therapy (Signed)
[place], [place], [address] Phone: [phone]   Fax:  [phone]  Occupational Therapy Treatment  Patient Details  Name: [name] MRN: [id number] Date of Birth: April 08, 1966 Referring Provider: Dr. Sanjuana Kava   Encounter Date: 04/08/2018  OT End of Session - 04/08/18 1608    Visit Number  2    Number of Visits  8    Date for OT Re-Evaluation  05/06/18    Authorization Type  UHC Medicare-no visit limit or copay    OT Start Time  1518    OT Stop Time  1554    OT Time Calculation (min)  36 min    Activity Tolerance  Patient tolerated treatment well    Behavior During Therapy  [facility] for tasks assessed/performed       Past Medical History:  Diagnosis Date  . Arthritis of knee   . Automatic implantable cardiac defibrillator in situ    a. s/p prior Medtronic ICD with 6949 lead. b. s/p Gen change & lead revision 05/2013.  . Cardiomyopathy secondary    Patient has cardiomyopathy out of proportion to her ischemic heart disease  . coronary artery disease    RCA stenting Myoview 2011 EF 30% infarction dilatation without ischemia  . Depression   . Diabetes mellitus   . GERD (gastroesophageal reflux disease)   . Insomnia   . Lupus (systemic lupus erythematosus) ([place])   . Migraines   . Myocardial infarction ([place])    8 total   . Nicotine abuse   . Other and unspecified hyperlipidemia   . Paroxysmal VT ([place])   . Systolic CHF ([place])   . VF (ventricular fibrillation) ([place])    a. Hx appropriate ICD therapy for VF.    Past Surgical History:  Procedure Laterality Date  . ABDOMINAL HYSTERECTOMY    . CHOLECYSTECTOMY    . COLONOSCOPY  07/15/2011   SLF: internal hemorrhoids/hyperplastic polyps in the rectum/tubular adenomaSURVEILLANCE Nov 2017  . defibrillator placed   2009 and 05/2013  . ICD GENERATOR CHANGE  06/17/2013   Dr Caryl Comes  . IMPLANTABLE CARDIOVERTER DEFIBRILLATOR (ICD) GENERATOR CHANGE Left  06/17/2013   Procedure: ICD GENERATOR CHANGE;  Surgeon: Deboraha Sprang, MD;  Location: [facility] CATH LAB;  Service: Cardiovascular;  Laterality: Left;  . LEAD REVISION N/A 06/17/2013   Procedure: LEAD REVISION;  Surgeon: Deboraha Sprang, MD;  Location: [facility] CATH LAB;  Service: Cardiovascular;  Laterality: N/A;  . ROTATOR CUFF REPAIR Right 2002    There were no vitals filed for this visit.  Subjective Assessment - 04/08/18 1518    Subjective   S: I can raise it a little higher today.     Currently in Pain?  Yes    Pain Score  4     Pain Location  Shoulder    Pain Orientation  Left    Pain Descriptors / Indicators  Constant;Sharp    Pain Type  Acute pain    Pain Radiating Towards  towards elbow    Pain Onset  More than a month ago    Pain Frequency  Constant    Aggravating Factors   lifting heavy objects, movement, pressure    Pain Relieving Factors  nothing    Effect of Pain on Daily Activities  min/mod effect on ADLs    Multiple Pain Sites  No         [facility] OT Assessment - 04/08/18 1517      Assessment  Medical Diagnosis  chronic left shoulder pain      Precautions   Precautions  None    Precaution Comments  pt has implanted defibrillator               OT Treatments/Exercises (OP) - 04/08/18 1539      Exercises   Exercises  Shoulder      Shoulder Exercises: Supine   Protraction  PROM;10 reps    Horizontal ABduction Limitations  unable to complete due to pain    External Rotation  AROM;10 reps    Internal Rotation  AROM;10 reps    Flexion  AROM;10 reps    ABduction  AROM;10 reps      Shoulder Exercises: Seated   Elevation  AROM;5 reps    Extension  AROM;5 reps    Row  AROM;5 reps      Shoulder Exercises: Therapy Ball   Flexion  10 reps      Shoulder Exercises: ROM/Strengthening   Pendulum  Pt completed one set of 10 side to side pendulums      Manual Therapy   Manual Therapy  Myofascial release    Manual therapy comments  completed separately from  therapeutic exercise    Myofascial Release  myofascial release to left upper arm, deltoid, trapezius, and scapularis regions to decrease pain and fascial restrictions and increase joint range of motion             OT Education - 04/08/18 1608    Education Details  scapular A/ROM, provided evaluation and reviewed goals    Person(s) Educated  Patient    Methods  Explanation;Demonstration;Handout    Comprehension  Verbalized understanding;Returned demonstration       OT Short Term Goals - 04/08/18 1618      OT SHORT TERM GOAL #1   Title  Pt will be provided with and educated on HEP to improve mobility required for ADL completion using LUE.     Time  4    Period  Weeks    Status  On-going      OT SHORT TERM GOAL #2   Title  Pt will decrease pain in LUE to 4/10 or less to improve ability to sleep consistently.     Time  4    Period  Weeks    Status  On-going      OT SHORT TERM GOAL #3   Title  Pt will decrease LUE fascial restrictions to minimal amounts or less to improve mobility required for overhead reaching tasks.     Time  4    Period  Weeks    Status  On-going      OT SHORT TERM GOAL #4   Title  Pt will increase LUE ROM to [facility] to improve ability to reach overhead and behind back during dressing tasks.     Time  4    Period  Weeks    Status  On-going      OT SHORT TERM GOAL #5   Title  Pt will increase LUE strength to 4-/5 to improve ability to use LUE as assist during meal preparation.     Time  4    Period  Weeks    Status  On-going               Plan - 04/08/18 1608    Clinical Impression Statement  A: Initiated myofascial release, P/ROM, therapy ball stretch, scapular A/ROM, and pendulum exercises today. Pt very tender and is  only able to tolerate very light manual techniques, entire session limited due to pain. Pt attempted all exercises, ROM is limited to 25-25% passively, 40% actively. Session ended early due to pain.      Plan  P: Follow up on  pain and HEP, attempt AA/ROM or wall wash       Patient will benefit from skilled therapeutic intervention in order to improve the following deficits and impairments:  Decreased activity tolerance, Decreased strength, Impaired flexibility, Decreased range of motion, Pain, Increased fascial restrictions, Impaired UE functional use  Visit Diagnosis: Acute pain of left shoulder  Other symptoms and signs involving the musculoskeletal system  Stiffness of left shoulder, not elsewhere classified    Problem List Patient Active Problem List   Diagnosis Date Noted  . Acute shoulder pain due to trauma, left 03/09/2018  . Hyperkalemia 01/01/2018  . Hypermagnesemia 01/01/2018  . Hypokalemia 12/25/2017  . Hypomagnesemia 12/25/2017  . Chronic systolic heart failure ([place]) 12/09/2017  . Onychomycosis 11/04/2017  . VF (ventricular fibrillation) ([place])   . Systolic CHF ([place])   . Paroxysmal VT ([place])   . Nicotine abuse   . Myocardial infarction ([place])   . Migraines   . Lupus (systemic lupus erythematosus) ([place])   . Insomnia   . Depression   . Arthritis of knee   . Chest pain with high risk for cardiac etiology 06/29/2017  . GERD (gastroesophageal reflux disease) 06/29/2017  . Muscle spasm of back 03/04/2017  . Vitamin D deficiency 07/26/2015  . Depression with anxiety 03/11/2015  . Metabolic syndrome X 40/97/3532  . Adhesive capsulitis of left shoulder 09/26/2013  . Ventricular tachycardia (paroxysmal) ([place]) 12/17/2012  . Nicotine dependence 11/07/2012  . HSV-2 seropositive 11/05/2012  . H/O abnormal Pap smear 11/02/2012  . Fatty liver 08/10/2012  . HTN, goal below 130/80 09/13/2011  . Allergic rhinitis 09/11/2011  . Ischemic cardiomyopathy 05/27/2011  . Implantable cardioverter-defibrillator (ICD) in situ 05/27/2011  . 6949 defibrillator lead 05/27/2011  . FATIGUE 06/05/2009  . Ventricular fibrillation ([place]) 02/04/2009  . Morbid obesity ([place]) 12/20/2008  . Diabetes mellitus type 2  with complications ([place]) 99/24/2683  . Hyperlipidemia LDL goal <100 08/31/2007  . ANXIETY 08/31/2007  . SLE 08/31/2007  . ARTHRITIS, KNEES, BILATERAL 08/31/2007  . AVASCULAR NECROSIS 08/31/2007  . MIGRAINES, HX OF 08/31/2007   Guadelupe Sabin, OTR/L  [phone] 04/08/2018, 4:19 PM  [place] [address] [place], [place], [address] Phone: [phone]   Fax:  [phone]  Name: Leanah Kolander MRN: [id number] Date of Birth: 1965-10-28

## 2018-04-08 NOTE — Progress Notes (Signed)
BMET ordered faxed to [facility].

## 2018-04-08 NOTE — Telephone Encounter (Signed)
Attempted ICM call to patient in regards to Dr Caryl Comes ordered BMET to be drawn this week and phone number is temporarily not in service.  Will send message through my chart if active or mail letter to patient.

## 2018-04-08 NOTE — Telephone Encounter (Signed)
Attempted call to emergency contact number for daughter so she could have patient call office and inform blood needs to be drawn.  Recording stated phone number temporarily not in service.

## 2018-04-08 NOTE — Progress Notes (Signed)
EPIC Encounter for ICM Monitoring  Patient Name: [name] is a 52 y.o. female Date: 04/08/2018 Primary Care Physican: Fayrene Helper, MD Primary Cardiologist: Klein/Bensimhon Electrophysiologist: Faustino Congress Weight: Previous [id number] lbs      Clinical Status Since 25-Mar-2018 VT-NS (>4 beats, >150 bpm) 3,877      Attempted call to patient and daughter (emergency contact number and on DRP) but unable to reach.   Both phone number are temporarily disconnected.  Transmission reviewed.       Thoracic impedance returned to normal since 03/25/2018 remote transmission.  Prescribed dosage: Furosemide 20 mg 1 tablet daily   Labs: 02/08/2018 Creatinine 0.69, BUN 8, Potassium 3.7, Sodium 139, EGFR 100-116 01/26/2018 Creatinine 0.85, BUN 7, Potassium 3.2, Sodium 137, EGFR >60 01/05/2018 Creatinine0.99, BUN8, Potassium4.6, Sodium136, EGFR>60, Magnesium 2.0 01/02/2018 Creatinine1.50, BUN26, Potassium4.2, Sodium133, [id number] at 8:28 AM  01/02/2018 Creatinine1.74, BUN29, Potassium6.2, Sodium133, [id number] at 4:34 AM  01/02/2018 Creatinine1.93, BUN29, Potassium5.0, Sodium135, TVNR04-13 at 1:06 AM  01/01/2018 Creatinine2.59, BUN34, Potassium>7.6 and dropped to 5.8 at (11:11 PM), [id number], [id number] at 7:23 PM, Magnesium 2.9 01/01/2018 Creatinine2.07, BUN31, Potassium6.5, Sodium129, FUWT21-82 at 4:13 PM, Magnesium 2.7 12/25/2017 Creatinine0.77, BUN9, Potassium 3.7, Sodium137, EGFR>60 at 6:35 AM  12/25/2017 Creatinine0.84, BUN8, Potassium 2.5, Sodium138, EGFR>60 at 12:17 AM, Magnesium 1.6 09/09/2017 Creatinine 0.74, BUN 7, Potassium 3.9, Sodium 137, EGFR >60  Recommendations:  Reviewed remote transmission with Dr Caryl Comes due to report showed 3,877 NSVT in the last 2 weeks.  BMET ordered due to history of low potassium. Patient hospitalized May 2019 with 2.5 potassium and device shock.  Will mail letter to patient with instructions for  labs to be drawn and to call office with working number.  Message sent to HF clinic to draw BMET at 04/13/2018 appointment.   Follow-up plan: ICM clinic phone appointment on 04/26/2018.   Office appointment scheduled 04/13/2018 with Dr. Haroldine Laws.    Copy of ICM check sent to Dr. Caryl Comes and Dr Haroldine Laws.   3 month ICM trend: 04/08/2018    1 Year ICM trend:       [name], RN 04/08/2018 10:28 AM

## 2018-04-08 NOTE — Progress Notes (Signed)
Message sent via my chart to patient regarding Dr Olin Pia order for labs to be drawn as soon as possible.

## 2018-04-12 ENCOUNTER — Other Ambulatory Visit: Payer: Self-pay | Admitting: Internal Medicine

## 2018-04-13 ENCOUNTER — Ambulatory Visit (HOSPITAL_COMMUNITY)
Admission: RE | Admit: 2018-04-13 | Discharge: 2018-04-13 | Disposition: A | Discharge status: Home / Self Care | Payer: Medicare Other | Source: Ambulatory Visit | Attending: Internal Medicine | Admitting: Internal Medicine

## 2018-04-13 ENCOUNTER — Encounter (HOSPITAL_COMMUNITY): Payer: Self-pay | Admitting: Internal Medicine

## 2018-04-13 ENCOUNTER — Ambulatory Visit (HOSPITAL_BASED_OUTPATIENT_CLINIC_OR_DEPARTMENT_OTHER)
Admission: RE | Admit: 2018-04-13 | Discharge: 2018-04-13 | Disposition: A | Discharge status: Home / Self Care | Payer: Medicare Other | Source: Ambulatory Visit | Attending: Internal Medicine | Admitting: Internal Medicine

## 2018-04-13 VITALS — BP 108/78 | HR 77 | Wt 210.0 lb

## 2018-04-13 DIAGNOSIS — I428 Other cardiomyopathies: Secondary | ICD-10-CM | POA: Diagnosis not present

## 2018-04-13 DIAGNOSIS — Z91030 Bee allergy status: Secondary | ICD-10-CM | POA: Diagnosis not present

## 2018-04-13 DIAGNOSIS — Z955 Presence of coronary angioplasty implant and graft: Secondary | ICD-10-CM | POA: Diagnosis not present

## 2018-04-13 DIAGNOSIS — Z720 Tobacco use: Secondary | ICD-10-CM | POA: Diagnosis not present

## 2018-04-13 DIAGNOSIS — I5020 Unspecified systolic (congestive) heart failure: Secondary | ICD-10-CM | POA: Diagnosis present

## 2018-04-13 DIAGNOSIS — Z95810 Presence of automatic (implantable) cardiac defibrillator: Secondary | ICD-10-CM | POA: Insufficient documentation

## 2018-04-13 DIAGNOSIS — Z833 Family history of diabetes mellitus: Secondary | ICD-10-CM | POA: Insufficient documentation

## 2018-04-13 DIAGNOSIS — Z800 Family history of malignant neoplasm of digestive organs: Secondary | ICD-10-CM | POA: Diagnosis not present

## 2018-04-13 DIAGNOSIS — Z7982 Long term (current) use of aspirin: Secondary | ICD-10-CM | POA: Diagnosis not present

## 2018-04-13 DIAGNOSIS — I5022 Chronic systolic (congestive) heart failure: Secondary | ICD-10-CM | POA: Diagnosis not present

## 2018-04-13 DIAGNOSIS — M329 Systemic lupus erythematosus, unspecified: Secondary | ICD-10-CM | POA: Insufficient documentation

## 2018-04-13 DIAGNOSIS — I255 Ischemic cardiomyopathy: Secondary | ICD-10-CM | POA: Insufficient documentation

## 2018-04-13 DIAGNOSIS — I2510 Atherosclerotic heart disease of native coronary artery without angina pectoris: Secondary | ICD-10-CM | POA: Diagnosis not present

## 2018-04-13 DIAGNOSIS — F17210 Nicotine dependence, cigarettes, uncomplicated: Secondary | ICD-10-CM | POA: Insufficient documentation

## 2018-04-13 DIAGNOSIS — Z8249 Family history of ischemic heart disease and other diseases of the circulatory system: Secondary | ICD-10-CM | POA: Diagnosis not present

## 2018-04-13 DIAGNOSIS — Z79899 Other long term (current) drug therapy: Secondary | ICD-10-CM | POA: Diagnosis not present

## 2018-04-13 DIAGNOSIS — Z7902 Long term (current) use of antithrombotics/antiplatelets: Secondary | ICD-10-CM | POA: Insufficient documentation

## 2018-04-13 DIAGNOSIS — R002 Palpitations: Secondary | ICD-10-CM | POA: Insufficient documentation

## 2018-04-13 DIAGNOSIS — I252 Old myocardial infarction: Secondary | ICD-10-CM | POA: Insufficient documentation

## 2018-04-13 DIAGNOSIS — Z886 Allergy status to analgesic agent status: Secondary | ICD-10-CM | POA: Diagnosis not present

## 2018-04-13 DIAGNOSIS — I472 Ventricular tachycardia: Secondary | ICD-10-CM | POA: Insufficient documentation

## 2018-04-13 DIAGNOSIS — Z8379 Family history of other diseases of the digestive system: Secondary | ICD-10-CM | POA: Diagnosis not present

## 2018-04-13 DIAGNOSIS — E785 Hyperlipidemia, unspecified: Secondary | ICD-10-CM | POA: Insufficient documentation

## 2018-04-13 DIAGNOSIS — Z823 Family history of stroke: Secondary | ICD-10-CM | POA: Insufficient documentation

## 2018-04-13 DIAGNOSIS — K219 Gastro-esophageal reflux disease without esophagitis: Secondary | ICD-10-CM | POA: Insufficient documentation

## 2018-04-13 DIAGNOSIS — F329 Major depressive disorder, single episode, unspecified: Secondary | ICD-10-CM | POA: Insufficient documentation

## 2018-04-13 DIAGNOSIS — I4729 Other ventricular tachycardia: Secondary | ICD-10-CM

## 2018-04-13 DIAGNOSIS — M1710 Unilateral primary osteoarthritis, unspecified knee: Secondary | ICD-10-CM | POA: Insufficient documentation

## 2018-04-13 DIAGNOSIS — E119 Type 2 diabetes mellitus without complications: Secondary | ICD-10-CM | POA: Insufficient documentation

## 2018-04-13 DIAGNOSIS — Z888 Allergy status to other drugs, medicaments and biological substances status: Secondary | ICD-10-CM | POA: Diagnosis not present

## 2018-04-13 DIAGNOSIS — I110 Hypertensive heart disease with heart failure: Secondary | ICD-10-CM | POA: Insufficient documentation

## 2018-04-13 DIAGNOSIS — G4700 Insomnia, unspecified: Secondary | ICD-10-CM | POA: Insufficient documentation

## 2018-04-13 MED ORDER — DIGOXIN 125 MCG PO TABS: 0.1250 mg | ORAL_TABLET | Freq: Every day | ORAL | 3 refills | Status: DC

## 2018-04-13 NOTE — Patient Instructions (Signed)
Start Digoxin 0.125 mg daily  Labs today  Your physician has recommended that you wear an event monitor. Event monitors are medical devices that record the heart's electrical activity. Doctors most often [place] these monitors to diagnose arrhythmias. Arrhythmias are problems with the speed or rhythm of the heartbeat. The monitor is a small, portable device. You can wear one while you do your normal daily activities. This is usually used to diagnose what is causing palpitations/syncope (passing out).  Your physician recommends that you schedule a follow-up appointment in: 6 weeks

## 2018-04-13 NOTE — Progress Notes (Signed)
Advanced Heart Failure Clinic Note   Referring Physician: Dr, Caryl Comes Primary Care: Tula Nakayama, MD Primary Cardiologist: Dr. Caryl Comes  HPI:  [name] is a 52 y.o. female with h/o CAD s/p RCA stenting in 6294, severe systolic HF due to mixed cardiomyopathy EF , recurrent VT s/p MDT ICD, SLE, GERD and ongoing tobacco use referred by Dr. Caryl Comes for further evaluation and treatment of HF.   EF 8/18: EF 20-25%. RV normal. Dilated LV with moderate MR   Last cath 2004 EF 29% Left main has a distal 30% stenosis. Left anterior descending artery has an ostial 30% stenosis.    Left circumflex gives rise to a very large branching ramus intermedius, two  small marginal branches, and two small posterolateral branches.  The ramus  intermedius gives off two branches.  The more medial branch has a 95%  stenosis at its ostium.  However, this is a small caliber branch being less  than or equal to 1.5 mm in diameter.  The circumflex otherwise has minor  luminal irregularities, but no significant stenoses.  Right coronary artery has a 20% stenosis in the mid vessel.  Just distal to  this there is a stent in the distal portion of the mid vessel which is  widely patent with 0% stenosis within the stent.    CT scan chest: 11/18. No PE. Mild ground glass opacitites. No COPD mentioned  She was admitted 5/9-5/05/2018 for syncope secondary to VT/VF. She was shocked by her ICD. Cardiology was consulted. Her potassium was 2.5 and magnesium was 1.6, so supplements were increased. Volume was stable.  She was admitted 5/17-5/18 with hyperkalemia >7.5 and AKI with creatinine 2.59. Potassium was corrected with kayexelate, albuterol, insulin/D50, and calcium gluconate. At discharge, potassium supplement, lasix, spiro, and ARB were stopped. K 4.2 and creatinine 1.55 on day of discharge.   She presents today for HF follow up. Overall doing okay. Having palpitations 3-4 times per day for 2 weeks that last  3-10 minutes. Occurs at rest or with activity. Gets lightheaded, SOB, and sweaty with palpitations. No ICD shocks. Denies SOB other than with palpitations. Not very active because she does not want to bring on palpitations. No orthopnea, PND, or edema. No CP. Not weighing at home. Smoking 1/2 ppd. Taking all medications, but ran out of losartan today because she was taking full pill instead of half. Weight unchanged on our scale. Takes extra lasix about once per month as directed by device clinic.   Medtronic ICD: volume trending up over the last week. Thoracic impedence below threshold. Active about 1 hour/day. No VT/VF.  Myoview 06/30/17: EF 13% There is a large defect of severe severity present in the basal anteroseptal, basal inferoseptal, basal inferior, mid anteroseptal, mid inferoseptal, mid inferior and apical inferior location.  CPX 08/04/17 Pre-Exercise PFTs  FVC 2.21 (71%)    FEV1 1.78 (72%)     FEV1/FVC 81 (100%)     MVV 69 (69%)   Exercise Time:  6:15  Speed (mph): 2.0    Grade (%): 7.0   RPE: 16 Reason stopped: patient ended test due to being lightheaded (9/10) Additional symptoms: dyspnea (7/10)  Resting HR: 79 Peak HR: 94  (54% age predicted max HR) BP rest: 86/62 BP peak: 114/70 Peak VO2: 10.0 (50% predicted peak VO2) VE/VCO2 slope: 46 OUES: 1.04 Peak RER: 0.91 Ventilatory Threshold: 9.4 (47% predicted or measured peak VO2) Peak RR 44 Peak Ventilation: 38.3 VE/MVV: 56% PETCO2 at peak: 26 O2pulse: 10  (  91% predicted O2pulse)  Review of systems complete and found to be negative unless listed in HPI.   Past Medical History:  Diagnosis Date  . Arthritis of knee   . Automatic implantable cardiac defibrillator in situ    a. s/p prior Medtronic ICD with 6949 lead. b. s/p Gen change & lead revision 05/2013.  . Cardiomyopathy secondary    Patient has cardiomyopathy out of proportion to her ischemic heart disease  . coronary artery disease    RCA  stenting Myoview 2011 EF 30% infarction dilatation without ischemia  . Depression   . Diabetes mellitus   . GERD (gastroesophageal reflux disease)   . Insomnia   . Lupus (systemic lupus erythematosus) ([place])   . Migraines   . Myocardial infarction ([place])    8 total   . Nicotine abuse   . Other and unspecified hyperlipidemia   . Paroxysmal VT ([place])   . Systolic CHF ([place])   . VF (ventricular fibrillation) ([place])    a. Hx appropriate ICD therapy for VF.   Current Outpatient Medications  Medication Sig Dispense Refill  . albuterol (PROVENTIL HFA;VENTOLIN HFA) 108 (90 Base) MCG/ACT inhaler Inhale 2 puffs into the lungs every 6 (six) hours as needed for wheezing or shortness of breath. 1 Inhaler 0  . ALPRAZolam (XANAX) 1 MG tablet Take 1 tablet (1 mg total) by mouth at bedtime. 30 tablet 4  . aspirin 81 MG tablet Take 1 tablet (81 mg total) by mouth daily. 30 tablet 6  . atorvastatin (LIPITOR) 10 MG tablet Take 1 tablet (10 mg total) by mouth daily. 90 tablet 2  . carvedilol (COREG) 12.5 MG tablet Take 1 tablet (12.5 mg total) by mouth 2 (two) times daily. 60 tablet 6  . cholecalciferol (VITAMIN D) 1000 units tablet Take 1,000 Units by mouth daily.    . clopidogrel (PLAVIX) 75 MG tablet Take 1 tablet (75 mg total) by mouth daily. 30 tablet 3  . cyclobenzaprine (FLEXERIL) 5 MG tablet Take 1 tablet (5 mg total) by mouth at bedtime. 30 tablet 4  . fenofibrate (TRICOR) 145 MG tablet Take 145 mg by mouth daily.     . furosemide (LASIX) 20 MG tablet Take 20 mg by mouth daily.    Marland Kitchen losartan (COZAAR) 25 MG tablet Take 0.5 tablets (12.5 mg total) by mouth daily. 15 tablet 11  . montelukast (SINGULAIR) 10 MG tablet TAKE 1 TABLET BY MOUTH ONCE A DAY AT BEDTIME. 30 tablet 0  . oxyCODONE (ROXICODONE) 15 MG immediate release tablet Take by mouth.     No current facility-administered medications for this encounter.    Allergies  Allergen Reactions  . Amiodarone     Headache and facial swelling  . Bee  Venom Anaphylaxis  . Mexiletine   . Cortisone Swelling  . Robaxin [Methocarbamol] Hives and Itching   Social History   Socioeconomic History  . Marital status: Single    Spouse name: Not on file  . Number of children: 3  . Years of education: Not on file  . Highest education level: Not on file  Occupational History  . Occupation: disabled    Fish farm manager: UNEMPLOYED  Social Needs  . Financial resource strain: Not hard at all  . Food insecurity:    Worry: Sometimes true    Inability: Sometimes true  . Transportation needs:    Medical: No    Non-medical: No  Tobacco Use  . Smoking status: Current Some Day Smoker    Packs/day: 0.50  Years: 15.00    Pack years: 7.50    Types: Cigarettes  . Smokeless tobacco: Never Used  Substance and Sexual Activity  . Alcohol use: Yes    Alcohol/week: 0.0 standard drinks    Comment: occasionaly  . Drug use: No  . Sexual activity: Not Currently    Birth control/protection: Surgical  Lifestyle  . Physical activity:    Days per week: 0 days    Minutes per session: 0 min  . Stress: Not at all  Relationships  . Social connections:    Talks on phone: More than three times a week    Gets together: Once a week    Attends religious service: Never    Active member of club or organization: No    Attends meetings of clubs or organizations: Never    Relationship status: Never married  . Intimate partner violence:    Fear of current or ex partner: No    Emotionally abused: No    Physically abused: No    Forced sexual activity: No  Other Topics Concern  . Not on file  Social History Narrative  . Not on file   Family History  Problem Relation Age of Onset  . Hepatitis Mother 44       HCV  . Liver cancer Mother 38  . Cancer Mother   . Diabetes Mother   . Heart disease Mother   . Hyperlipidemia Mother   . Stroke Sister 45       x3  . Diabetes Sister   . Hyperlipidemia Sister   . Dementia Father   . HIV Sister   . Diabetes Brother    . Heart disease Brother   . Hyperlipidemia Brother    Vitals:   04/13/18 1505  BP: 108/78  Pulse: 77  SpO2: 98%  Weight: 95.3 kg (210 lb)    Wt Readings from Last 3 Encounters:  04/13/18 95.3 kg (210 lb)  03/30/18 94.3 kg (208 lb)  03/09/18 94.8 kg (209 lb)    PHYSICAL EXAM: General: Fatigued appearing. No resp difficulty. HEENT: Normal Anicteric  Neck: Supple. JVP 5-6. Carotids 2+ bilat; no bruits. No thyromegaly or nodule noted. Cor: PMI laterally displaced. RRR, No M/G/R noted Lungs: CTAB, normal effort. Decreased BS throughout. No wheeze.  Abdomen: Obese Soft, non-tender, non-distended, no HSM. No bruits or masses. +BS  Extremities: no cyanosis, clubbing, rash, edema Neuro: alert & oriented x 3, cranial nerves grossly intact. moves all 4 extremities w/o difficulty. Affect pleasant  EKG: NSR 79 bpm with occasional PVCs, LVH. Personally reviewed.   ASSESSMENT & PLAN:  1. Chronic systolic HF - She has long-standing mixed ischemic and NICM. EF 20-25% by echo 8/18. 13% by Myoview 11/18. S/p MDT ICD - Echo today EF 15-20%. Reviewed by Dr Haroldine Laws - NYHA II - Volume status stable on exam, trending up on optivol.  - Continue lasix 20 mg daily. - Continue losartan 12.5 mg qHS.  - Continue carvedilol 12.5 mg BID - Start digoxin 0.125 mg daily - Hold off on spiro for now with hyper/hypokalemia. BMET today.  - CPX 08/04/17 at least moderate limitation from cardiac perspective.  - Previously Dr Haroldine Laws has had discussion about possible need for advanced therapies and need for further risk stratification with CPX or cath depending on how much she improves after diuresis. Will need to [facility] if functional status worsens. Pt hesitant to consider transplant process or advanced therapies.  - Discussed absolute need to stop smoking to make her  eligible for transplant process. Discussed possibility of LVAD. She is resistant to the idea of a VAD. She was given LVAD book.  - Reinforced  fluid restriction to < 2 L daily, sodium restriction to less than 2000 mg daily, and the importance of daily weights.    2. CAD - s/p remote RCA stent. - No s/s ischemia.    - last cath 2004. - Myoview from 11/18: EF 13% no obvious ischemia - Continue ASA and statin.  3. HTN - Well controlled with HF meds.   4. Tobacco use - Continues to smoke. Encouraged complete cessation.   5. VT - Managed by Dr. Caryl Comes. S/p MDT ICD.  - Intolerant to amiodarone and mexiletine - S/p ICD shock 12/2017 in setting of K 2.5, mag 1.6. - She is aware that she should not drive until 09/5850.  - ICD interrogation by rep: no VT/VF.   6. SLE - Followed by [facility]ologist at [facility]. Has been quiescent for many years.  - No change to current plan.    7. Recent AKI - Resolved on labs 01/2018. BMET today  8. Palpitations - No VT/VF on ICD interrogation. She has a single lead, so did not show AF/AT. - Check BMET, mag - Ziopatch  BMET, mag Ziopatch Start digoxin 0.125 mg daily   Georgiana Shore, NP  3:07 PM   Patient seen and examined with the above-signed Advanced Practice Provider and/or Housestaff. I personally reviewed laboratory data, imaging studies and relevant notes. I independently examined the patient and formulated the important aspects of the plan. I have edited the note to reflect any of my changes or salient points. I have personally discussed the plan with the patient and/or family.  Previous CPX in 12/18 showed severe HF limitation. She returns today with her daughter. Initially said she was doing well except for intermittent brief periods of palpitations during which she feels poorly. However, on further questioning really has NYHA III-IIIB symptoms. Volume status looks ok. ICD interrogated personally and several brief runs of NSVT but nothing to correspond with her symptoms. No atrial lead present so will place ziopatch to look for PAF.   I had long talk with her and her daughter about  possible need for advanced therapies but she will not qualify for transplant due to ongoing tobacco use and refuses VAD. We dicussed repeat CPX but she also refused that. Will add digoxin. We wanted to restart spiro but she was reluctant. We eventually provided her with a VAD booklet and said we would talk more about it later.   Total time personally spent 45 minutes. Over half that time spent discussing above.   Glori Bickers, MD  11:17 PM

## 2018-04-13 NOTE — Progress Notes (Signed)
  Echocardiogram 2D Echocardiogram has been performed.  [name] 04/13/2018, 3:02 PM

## 2018-04-14 LAB — BASIC METABOLIC PANEL
Anion gap: 11 (ref 5–15)
BUN: 5 mg/dL — ABNORMAL LOW (ref 6–20)
CALCIUM: 9.3 mg/dL (ref 8.9–10.3)
CHLORIDE: 103 mmol/L (ref 98–111)
CO2: 27 mmol/L (ref 22–32)
CREATININE: 0.84 mg/dL (ref 0.44–1.00)
GFR calc non Af Amer: >60 mL/min (ref >60)
Glucose, Bld: 43 mg/dL — CL (ref 70–99)
Potassium: 4.3 mmol/L (ref 3.5–5.1)
SODIUM: 141 mmol/L (ref 135–145)

## 2018-04-14 LAB — MAGNESIUM: MAGNESIUM: 2.3 mg/dL (ref 1.7–2.4)

## 2018-04-15 ENCOUNTER — Encounter

## 2018-04-16 ENCOUNTER — Encounter (HOSPITAL_COMMUNITY): Payer: Self-pay | Admitting: Occupational Therapy

## 2018-04-16 ENCOUNTER — Ambulatory Visit (HOSPITAL_COMMUNITY): Payer: Medicare Other | Admitting: Occupational Therapy

## 2018-04-16 DIAGNOSIS — R29898 Other symptoms and signs involving the musculoskeletal system: Secondary | ICD-10-CM

## 2018-04-16 DIAGNOSIS — M25512 Pain in left shoulder: Secondary | ICD-10-CM | POA: Diagnosis not present

## 2018-04-16 DIAGNOSIS — M25612 Stiffness of left shoulder, not elsewhere classified: Secondary | ICD-10-CM

## 2018-04-16 NOTE — Therapy (Signed)
[place], [place], [address] Phone: [phone]   Fax:  [phone]  Occupational Therapy Treatment  Patient Details  Name: [name] MRN: [id number] Date of Birth: Jan 22, 1966 Referring Provider: Dr. Sanjuana Kava   Encounter Date: 04/16/2018  OT End of Session - 04/16/18 1601    Visit Number  3    Number of Visits  8    Date for OT Re-Evaluation  05/06/18    Authorization Type  [employer] Medicare-no visit limit or copay    OT Start Time  1515    OT Stop Time  1555    OT Time Calculation (min)  40 min    Activity Tolerance  Patient tolerated treatment well    Behavior During Therapy  [facility] for tasks assessed/performed       Past Medical History:  Diagnosis Date  . Arthritis of knee   . Automatic implantable cardiac defibrillator in situ    a. s/p prior Medtronic ICD with 6949 lead. b. s/p Gen change & lead revision 05/2013.  . Cardiomyopathy secondary    Patient has cardiomyopathy out of proportion to her ischemic heart disease  . coronary artery disease    RCA stenting Myoview 2011 EF 30% infarction dilatation without ischemia  . Depression   . Diabetes mellitus   . GERD (gastroesophageal reflux disease)   . Insomnia   . Lupus (systemic lupus erythematosus) ([place])   . Migraines   . Myocardial infarction ([place])    8 total   . Nicotine abuse   . Other and unspecified hyperlipidemia   . Paroxysmal VT ([place])   . Systolic CHF ([place])   . VF (ventricular fibrillation) ([place])    a. Hx appropriate ICD therapy for VF.    Past Surgical History:  Procedure Laterality Date  . ABDOMINAL HYSTERECTOMY    . CHOLECYSTECTOMY    . COLONOSCOPY  07/15/2011   SLF: internal hemorrhoids/hyperplastic polyps in the rectum/tubular adenomaSURVEILLANCE Nov 2017  . defibrillator placed   2009 and 05/2013  . ICD GENERATOR CHANGE  06/17/2013   Dr Caryl Comes  . IMPLANTABLE CARDIOVERTER DEFIBRILLATOR (ICD) GENERATOR CHANGE Left  06/17/2013   Procedure: ICD GENERATOR CHANGE;  Surgeon: Deboraha Sprang, MD;  Location: [facility] CATH LAB;  Service: Cardiovascular;  Laterality: Left;  . LEAD REVISION N/A 06/17/2013   Procedure: LEAD REVISION;  Surgeon: Deboraha Sprang, MD;  Location: [facility] CATH LAB;  Service: Cardiovascular;  Laterality: N/A;  . ROTATOR CUFF REPAIR Right 2002    There were no vitals filed for this visit.  Subjective Assessment - 04/16/18 1558    Subjective   S: It feels like it's getting better this week.     Currently in Pain?  Yes    Pain Score  3     Pain Location  Shoulder    Pain Orientation  Left    Pain Descriptors / Indicators  Sore    Pain Type  Acute pain    Pain Radiating Towards  towards elbow    Pain Onset  More than a month ago    Pain Frequency  Constant    Aggravating Factors   movement and running into objects    Pain Relieving Factors  muscle relaxer    Effect of Pain on Daily Activities  min/mod effect on ADLs    Multiple Pain Sites  No        Pediatric OT Objective Assessment - 04/16/18 1521  Pain Assessment   Pain Type  Acute pain    Pain Location  Shoulder    Pain Orientation  Left    Pain Radiating Towards  towards elbow    Pain Descriptors / Indicators  Sharp    Pain Frequency  Constant      [facility] OT Assessment - 04/16/18 1520      Assessment   Medical Diagnosis  chronic left shoulder pain      Precautions   Precautions  None    Precaution Comments  pt has implanted defibrillator               OT Treatments/Exercises (OP) - 04/16/18 1517      Exercises   Exercises  Shoulder      Shoulder Exercises: Supine   Protraction  AAROM;10 reps;PROM;5 reps    Horizontal ABduction Limitations  unable to complete due to pain    External Rotation  AAROM;10 reps;PROM;5 reps    Internal Rotation  AAROM;10 reps;PROM;5 reps    Flexion  AAROM;10 reps;PROM;5 reps    ABduction  PROM;5 reps    ABduction Limitations  Unable to complete A/AROM due to pain       Shoulder Exercises: Seated   Elevation  AROM;10 reps    Extension  AROM;10 reps    Row  AROM;10 reps      Shoulder Exercises: Pulleys   Flexion  1 minute    ABduction  1 minute    ABduction Limitations  50% ROM      Shoulder Exercises: Therapy Ball   ABduction  10 reps      Shoulder Exercises: ROM/Strengthening   Wall Wash  1'    Thumb Tacks  1'       Manual Therapy   Manual Therapy  Myofascial release    Manual therapy comments  completed separately from therapeutic exercise    Myofascial Release  myofascial release to left upper arm, deltoid, trapezius, and scapularis regions to decrease pain and fascial restrictions and increase joint range of motion               OT Short Term Goals - 04/08/18 1618      OT SHORT TERM GOAL #1   Title  Pt will be provided with and educated on HEP to improve mobility required for ADL completion using LUE.     Time  4    Period  Weeks    Status  On-going      OT SHORT TERM GOAL #2   Title  Pt will decrease pain in LUE to 4/10 or less to improve ability to sleep consistently.     Time  4    Period  Weeks    Status  On-going      OT SHORT TERM GOAL #3   Title  Pt will decrease LUE fascial restrictions to minimal amounts or less to improve mobility required for overhead reaching tasks.     Time  4    Period  Weeks    Status  On-going      OT SHORT TERM GOAL #4   Title  Pt will increase LUE ROM to [facility] to improve ability to reach overhead and behind back during dressing tasks.     Time  4    Period  Weeks    Status  On-going      OT SHORT TERM GOAL #5   Title  Pt will increase LUE strength to 4-/5 to improve ability to  use LUE as assist during meal preparation.     Time  4    Period  Weeks    Status  On-going               Plan - 04/16/18 1602    Clinical Impression Statement  A: Pt with improved tolerance to manual techniques today, manual therapy completed to address fascial restrictions limiting ROM. Pt able to  achieve ROM at 50% for flexion and approximately 40% abduction with passive stretching. Initiated AA/ROM, wall wash, and pulley exercises today, continued with scapular A/ROM which has significantly improved since prior session. Verbal cuing for form and technique during exercises.     Plan  P: continue with manual therapy, passive stretching, and AA/ROM. Attempt to add abduction with AA/ROM and horizontal abduction passive and AA/ROM       Patient will benefit from skilled therapeutic intervention in order to improve the following deficits and impairments:  Decreased activity tolerance, Decreased strength, Impaired flexibility, Decreased range of motion, Pain, Increased fascial restrictions, Impaired UE functional use  Visit Diagnosis: Acute pain of left shoulder  Other symptoms and signs involving the musculoskeletal system  Stiffness of left shoulder, not elsewhere classified    Problem List Patient Active Problem List   Diagnosis Date Noted  . Acute shoulder pain due to trauma, left 03/09/2018  . Hyperkalemia 01/01/2018  . Hypermagnesemia 01/01/2018  . Hypokalemia 12/25/2017  . Hypomagnesemia 12/25/2017  . Chronic systolic heart failure ([place]) 12/09/2017  . Onychomycosis 11/04/2017  . VF (ventricular fibrillation) ([place])   . Systolic CHF ([place])   . Paroxysmal VT ([place])   . Nicotine abuse   . Myocardial infarction ([place])   . Migraines   . Lupus (systemic lupus erythematosus) ([place])   . Insomnia   . Depression   . Arthritis of knee   . Chest pain with high risk for cardiac etiology 06/29/2017  . GERD (gastroesophageal reflux disease) 06/29/2017  . Muscle spasm of back 03/04/2017  . Vitamin D deficiency 07/26/2015  . Depression with anxiety 03/11/2015  . Metabolic syndrome X 93/23/5573  . Adhesive capsulitis of left shoulder 09/26/2013  . Ventricular tachycardia (paroxysmal) ([place]) 12/17/2012  . Nicotine dependence 11/07/2012  . HSV-2 seropositive 11/05/2012  . H/O abnormal  Pap smear 11/02/2012  . Fatty liver 08/10/2012  . HTN, goal below 130/80 09/13/2011  . Allergic rhinitis 09/11/2011  . Ischemic cardiomyopathy 05/27/2011  . Implantable cardioverter-defibrillator (ICD) in situ 05/27/2011  . 6949 defibrillator lead 05/27/2011  . FATIGUE 06/05/2009  . Ventricular fibrillation ([place]) 02/04/2009  . Morbid obesity ([place]) 12/20/2008  . Diabetes mellitus type 2 with complications ([place]) 22/09/5425  . Hyperlipidemia LDL goal <100 08/31/2007  . ANXIETY 08/31/2007  . SLE 08/31/2007  . ARTHRITIS, KNEES, BILATERAL 08/31/2007  . AVASCULAR NECROSIS 08/31/2007  . MIGRAINES, HX OF 08/31/2007   Guadelupe Sabin, OTR/L  [phone] 04/16/2018, 8:06 PM  [facility] [address] [place], [place], [address] Phone: [phone]   Fax:  [phone]  Name: [name] MRN: [id number] Date of Birth: [birth year]

## 2018-04-20 ENCOUNTER — Encounter: Payer: Self-pay | Admitting: Orthopaedic Surgery

## 2018-04-20 ENCOUNTER — Ambulatory Visit (INDEPENDENT_AMBULATORY_CARE_PROVIDER_SITE_OTHER): Payer: Medicare Other | Admitting: Orthopaedic Surgery

## 2018-04-20 VITALS — BP 109/76 | HR 79 | Ht 66.0 in | Wt 212.0 lb

## 2018-04-20 DIAGNOSIS — F17210 Nicotine dependence, cigarettes, uncomplicated: Secondary | ICD-10-CM

## 2018-04-20 DIAGNOSIS — M25512 Pain in left shoulder: Secondary | ICD-10-CM

## 2018-04-20 DIAGNOSIS — G8929 Other chronic pain: Secondary | ICD-10-CM | POA: Diagnosis not present

## 2018-04-20 MED ORDER — NAPROXEN 500 MG PO TABS: 500.0000 mg | ORAL_TABLET | Freq: Two times a day (BID) | ORAL | 5 refills | Status: DC

## 2018-04-20 NOTE — Progress Notes (Signed)
Patient [name], female DOB:1965-11-27, 52 y.o. QPY:[id number]  Chief Complaint  Patient presents with  . Shoulder Pain    left    HPI  [name] is a 52 y.o. female who has continued pain of the left shoulder.  She went to PT.  She did not get the Naprosyn as she said the drug store did not get it but she did not call and let us know.  She cannot take prednisone as she swells up when taking it.  She has no new trauma.  She is a little better but only a little with the PT.  I have called in the Naprosyn today.   Body mass index is 34.22 kg/m.  ROS  Review of Systems  Constitutional: Positive for activity change.  Musculoskeletal: Positive for arthralgias and myalgias.  All other systems reviewed and are negative.   All other systems reviewed and are negative.  The following is a summary of the past history medically, past history surgically, known current medicines, social history and family history.  This information is gathered electronically by the computer from prior information and documentation.  I review this each visit and have found including this information at this point in the chart is beneficial and informative.    Past Medical History:  Diagnosis Date  . Arthritis of knee   . Automatic implantable cardiac defibrillator in situ    a. s/p prior Medtronic ICD with 6949 lead. b. s/p Gen change & lead revision 05/2013.  . Cardiomyopathy secondary    Patient has cardiomyopathy out of proportion to her ischemic heart disease  . coronary artery disease    RCA stenting Myoview 2011 EF 30% infarction dilatation without ischemia  . Depression   . Diabetes mellitus   . GERD (gastroesophageal reflux disease)   . Insomnia   . Lupus (systemic lupus erythematosus) ([place])   . Migraines   . Myocardial infarction ([place])    8 total   . Nicotine abuse   . Other and unspecified hyperlipidemia   . Paroxysmal VT ([place])   . Systolic CHF ([place])   . VF  (ventricular fibrillation) ([place])    a. Hx appropriate ICD therapy for VF.    Past Surgical History:  Procedure Laterality Date  . ABDOMINAL HYSTERECTOMY    . CHOLECYSTECTOMY    . COLONOSCOPY  07/15/2011   SLF: internal hemorrhoids/hyperplastic polyps in the rectum/tubular adenomaSURVEILLANCE Nov 2017  . defibrillator placed   2009 and 05/2013  . ICD GENERATOR CHANGE  06/17/2013   Dr Caryl Comes  . IMPLANTABLE CARDIOVERTER DEFIBRILLATOR (ICD) GENERATOR CHANGE Left 06/17/2013   Procedure: ICD GENERATOR CHANGE;  Surgeon: Deboraha Sprang, MD;  Location: [facility] CATH LAB;  Service: Cardiovascular;  Laterality: Left;  . LEAD REVISION N/A 06/17/2013   Procedure: LEAD REVISION;  Surgeon: Deboraha Sprang, MD;  Location: [facility] CATH LAB;  Service: Cardiovascular;  Laterality: N/A;  . ROTATOR CUFF REPAIR Right 2002    Family History  Problem Relation Age of Onset  . Hepatitis Mother 62       HCV  . Liver cancer Mother 4  . Cancer Mother   . Diabetes Mother   . Heart disease Mother   . Hyperlipidemia Mother   . Stroke Sister 45       x3  . Diabetes Sister   . Hyperlipidemia Sister   . Dementia Father   . HIV Sister   . Diabetes Brother   . Heart disease Brother   .  Hyperlipidemia Brother     Social History Social History   Tobacco Use  . Smoking status: Current Some Day Smoker    Packs/day: 0.50    Years: 15.00    Pack years: 7.50    Types: Cigarettes  . Smokeless tobacco: Never Used  Substance Use Topics  . Alcohol use: Yes    Alcohol/week: 0.0 standard drinks    Comment: occasionaly  . Drug use: No    Allergies  Allergen Reactions  . Amiodarone     Headache and facial swelling  . Bee Venom Anaphylaxis  . Mexiletine   . Cortisone Swelling  . Robaxin [Methocarbamol] Hives and Itching    Current Outpatient Medications  Medication Sig Dispense Refill  . albuterol (PROVENTIL HFA;VENTOLIN HFA) 108 (90 Base) MCG/ACT inhaler Inhale 2 puffs into the lungs every 6 (six) hours as  needed for wheezing or shortness of breath. 1 Inhaler 0  . ALPRAZolam (XANAX) 1 MG tablet Take 1 tablet (1 mg total) by mouth at bedtime. 30 tablet 4  . aspirin 81 MG tablet Take 1 tablet (81 mg total) by mouth daily. 30 tablet 6  . atorvastatin (LIPITOR) 10 MG tablet Take 1 tablet (10 mg total) by mouth daily. 90 tablet 2  . cholecalciferol (VITAMIN D) 1000 units tablet Take 1,000 Units by mouth daily.    . clopidogrel (PLAVIX) 75 MG tablet Take 1 tablet (75 mg total) by mouth daily. 30 tablet 3  . cyclobenzaprine (FLEXERIL) 5 MG tablet Take 1 tablet (5 mg total) by mouth at bedtime. 30 tablet 4  . digoxin (LANOXIN) 0.125 MG tablet Take 1 tablet (0.125 mg total) by mouth daily. 90 tablet 3  . fenofibrate (TRICOR) 145 MG tablet Take 145 mg by mouth daily.     . furosemide (LASIX) 20 MG tablet Take 20 mg by mouth daily.    Marland Kitchen losartan (COZAAR) 25 MG tablet Take 25 mg by mouth daily.    . montelukast (SINGULAIR) 10 MG tablet TAKE 1 TABLET BY MOUTH ONCE A DAY AT BEDTIME. 30 tablet 0  . oxyCODONE (ROXICODONE) 15 MG immediate release tablet Take by mouth.    . carvedilol (COREG) 12.5 MG tablet Take 1 tablet (12.5 mg total) by mouth 2 (two) times daily. 60 tablet 6  . naproxen (NAPROSYN) 500 MG tablet Take 1 tablet (500 mg total) by mouth 2 (two) times daily with a meal. 60 tablet 5   No current facility-administered medications for this visit.      Physical Exam  Blood pressure 109/76, pulse 79, height 5\' 6"  (1.676 m), weight 212 lb (96.2 kg).  Constitutional: overall normal hygiene, normal nutrition, well developed, normal grooming, normal body habitus. Assistive device:none  Musculoskeletal: gait and station Limp none, muscle tone and strength are normal, no tremors or atrophy is present.  .  Neurological: coordination overall normal.  Deep tendon reflex/nerve stretch intact.  Sensation normal.  Cranial nerves II-XII intact.   Skin:   Normal overall no scars, lesions, ulcers or rashes. No  psoriasis.  Psychiatric: Alert and oriented x 3.  Recent memory intact, remote memory unclear.  Normal mood and affect. Well groomed.  Good eye contact.  Cardiovascular: overall no swelling, no varicosities, no edema bilaterally, normal temperatures of the legs and arms, no clubbing, cyanosis and good capillary refill.  Lymphatic: palpation is normal.  Examination of left Upper Extremity is done.  Inspection:   Overall:  Elbow non-tender without crepitus or defects, forearm non-tender without crepitus or defects, wrist  non-tender without crepitus or defects, hand non-tender.    Shoulder: with glenohumeral joint tenderness, without effusion.   Upper arm: without swelling and tenderness   Range of motion:   Overall:  Full range of motion of the elbow, full range of motion of wrist and full range of motion in fingers.   Shoulder:  left  140 degrees forward flexion; 120 degrees abduction; 30 degrees internal rotation, 30 degrees external rotation, 10 degrees extension, 40 degrees adduction.   Stability:   Overall:  Shoulder, elbow and wrist stable   Strength and Tone:   Overall full shoulder muscles strength, full upper arm strength and normal upper arm bulk and tone.  All other systems reviewed and are negative   The patient has been educated about the nature of the problem(s) and counseled on treatment options.  The patient appeared to understand what I have discussed and is in agreement with it.  Encounter Diagnoses  Name Primary?  . Chronic left shoulder pain Yes  . Cigarette nicotine dependence without complication     PLAN Call if any problems.  Precautions discussed.  Continue current medications.   Return to clinic 2 weeks   Keep up PT.  The Naprosyn was sent in.  Electronically [place], MD 9/3/20192:23 PM

## 2018-04-21 ENCOUNTER — Telehealth (HOSPITAL_COMMUNITY): Payer: Self-pay | Admitting: Occupational Therapy

## 2018-04-21 ENCOUNTER — Ambulatory Visit (HOSPITAL_COMMUNITY): Payer: Medicare Other | Admitting: Occupational Therapy

## 2018-04-21 NOTE — Telephone Encounter (Signed)
Not coming today she is not feeling good

## 2018-04-23 ENCOUNTER — Ambulatory Visit (HOSPITAL_COMMUNITY): Payer: Medicare Other | Attending: Orthopaedic Surgery

## 2018-04-23 ENCOUNTER — Encounter (HOSPITAL_COMMUNITY): Payer: Self-pay

## 2018-04-23 DIAGNOSIS — R29898 Other symptoms and signs involving the musculoskeletal system: Secondary | ICD-10-CM | POA: Diagnosis not present

## 2018-04-23 DIAGNOSIS — M25512 Pain in left shoulder: Secondary | ICD-10-CM | POA: Diagnosis not present

## 2018-04-23 DIAGNOSIS — G8929 Other chronic pain: Secondary | ICD-10-CM | POA: Diagnosis not present

## 2018-04-23 DIAGNOSIS — M25612 Stiffness of left shoulder, not elsewhere classified: Secondary | ICD-10-CM | POA: Diagnosis not present

## 2018-04-23 NOTE — Therapy (Deleted)
[place], [place], [address] Phone: [phone]   Fax:  [phone]  Occupational Therapy Treatment  Patient Details  Name: Roda Lauture MRN: [id number] Date of Birth: 1965-08-30 Referring Provider: Dr. Sanjuana Kava   Encounter Date: 04/23/2018  OT End of Session - 04/23/18 1656    Visit Number  4    Number of Visits  8    Date for OT Re-Evaluation  05/06/18    Authorization Type  UHC Medicare-no visit limit or copay    OT Start Time  4709   Pt arrived late today   OT Stop Time  1645    OT Time Calculation (min)  32 min    Activity Tolerance  Patient limited by pain    Behavior During Therapy  [facility] for tasks assessed/performed       Past Medical History:  Diagnosis Date  . Arthritis of knee   . Automatic implantable cardiac defibrillator in situ    a. s/p prior Medtronic ICD with 6949 lead. b. s/p Gen change & lead revision 05/2013.  . Cardiomyopathy secondary    Patient has cardiomyopathy out of proportion to her ischemic heart disease  . coronary artery disease    RCA stenting Myoview 2011 EF 30% infarction dilatation without ischemia  . Depression   . Diabetes mellitus   . GERD (gastroesophageal reflux disease)   . Insomnia   . Lupus (systemic lupus erythematosus) ([place])   . Migraines   . Myocardial infarction ([place])    8 total   . Nicotine abuse   . Other and unspecified hyperlipidemia   . Paroxysmal VT ([place])   . Systolic CHF ([place])   . VF (ventricular fibrillation) ([place])    a. Hx appropriate ICD therapy for VF.    Past Surgical History:  Procedure Laterality Date  . ABDOMINAL HYSTERECTOMY    . CHOLECYSTECTOMY    . COLONOSCOPY  07/15/2011   SLF: internal hemorrhoids/hyperplastic polyps in the rectum/tubular adenomaSURVEILLANCE Nov 2017  . defibrillator placed   2009 and 05/2013  . ICD GENERATOR CHANGE  06/17/2013   Dr Caryl Comes  . IMPLANTABLE CARDIOVERTER DEFIBRILLATOR (ICD)  GENERATOR CHANGE Left 06/17/2013   Procedure: ICD GENERATOR CHANGE;  Surgeon: Deboraha Sprang, MD;  Location: [facility] C[facility] LAB;  Service: Cardiovascular;  Laterality: Left;  . LEAD REVISION N/A 06/17/2013   Procedure: LEAD REVISION;  Surgeon: Deboraha Sprang, MD;  Location: [facility] CATH LAB;  Service: Cardiovascular;  Laterality: N/A;  . ROTATOR CUFF REPAIR Right 2002    There were no vitals filed for this visit.  Subjective Assessment - 04/23/18 1612    Subjective   S: I'm feeling terrible today.     Currently in Pain?  Yes    Pain Score  6     Pain Location  Shoulder    Pain Orientation  Left    Pain Descriptors / Indicators  Sharp    Pain Type  Acute pain    Pain Radiating Towards  Elbow    Pain Onset  More than a month ago    Pain Frequency  Constant    Aggravating Factors   Movement, Use of arm    Pain Relieving Factors  Medication    Effect of Pain on Daily Activities  Moderate effect on daily activities.    Multiple Pain Sites  No         [facility] OT Assessment - 04/23/18 1613      Assessment  Medical Diagnosis  chronic left shoulder pain      Precautions   Precautions  None    Precaution Comments  pt has implanted defibrillator               OT Treatments/Exercises (OP) - 04/23/18 1613      Exercises   Exercises  Shoulder      Shoulder Exercises: Supine   Protraction  PROM;5 reps;AAROM;15 reps    Horizontal ABduction Limitations  unable to complete due to pain    External Rotation  PROM;5 reps;AAROM;15 reps    Internal Rotation  PROM;5 reps;AAROM;15 reps    Flexion  PROM;5 reps;AAROM;15 reps    ABduction  PROM;5 reps    ABduction Limitations  Unable to complete A/AROM due to pain      Shoulder Exercises: Seated   Elevation  AROM;12 reps    Extension  AROM;12 reps    Row  AROM;12 reps      Shoulder Exercises: Therapy Ball   Flexion  5 reps    Flexion Limitations  Unable to complete more repetitions due to pain     ABduction  5 reps    ABduction  Limitations  Unable to complete more repetitions due to pain      Shoulder Exercises: ROM/Strengthening   Wall Wash  1'    Thumb Tacks  1'       Manual Therapy   Manual Therapy  Myofascial release    Manual therapy comments  completed separately from therapeutic exercise    Myofascial Release  myofascial release to left upper arm, deltoid, trapezius, and scapularis regions to decrease pain and fascial restrictions and increase joint range of motion               OT Short Term Goals - 04/08/18 1618      OT SHORT TERM GOAL #1   Title  Pt will be provided with and educated on HEP to improve mobility required for ADL completion using LUE.     Time  4    Period  Weeks    Status  On-going      OT SHORT TERM GOAL #2   Title  Pt will decrease pain in LUE to 4/10 or less to improve ability to sleep consistently.     Time  4    Period  Weeks    Status  On-going      OT SHORT TERM GOAL #3   Title  Pt will decrease LUE fascial restrictions to minimal amounts or less to improve mobility required for overhead reaching tasks.     Time  4    Period  Weeks    Status  On-going      OT SHORT TERM GOAL #4   Title  Pt will increase LUE ROM to [facility] to improve ability to reach overhead and behind back during dressing tasks.     Time  4    Period  Weeks    Status  On-going      OT SHORT TERM GOAL #5   Title  Pt will increase LUE strength to 4-/5 to improve ability to use LUE as assist during meal preparation.     Time  4    Period  Weeks    Status  On-going               Plan - 04/23/18 1657    Clinical Impression Statement  A: Manual therapy completed to address fascial restrictions limiting  ROM. Pt able to achieve ROM at approximately 50% for flexion and approximately 40% abduction with passive stretching. Continued AA/ROM, wall wash, scapular A/ROM with increased repetitions as tolerated. Pt limited by pain and reportedly feeling ill today. Verbal cuing for form and  technique during exercises.      Plan  P: Continue with manual therapy, passive stretching, and AA/ROM. Attempt to add abduction with AA/ROM and horizontal abduction passive and AA/ROM as tolerated.         Patient will benefit from skilled therapeutic intervention in order to improve the following deficits and impairments:  Decreased activity tolerance, Decreased strength, Impaired flexibility, Pain, Decreased range of motion, Impaired UE functional use  Visit Diagnosis: Chronic left shoulder pain    Problem List Patient Active Problem List   Diagnosis Date Noted  . Acute shoulder pain due to trauma, left 03/09/2018  . Hyperkalemia 01/01/2018  . Hypermagnesemia 01/01/2018  . Hypokalemia 12/25/2017  . Hypomagnesemia 12/25/2017  . Chronic systolic heart failure ([place]) 12/09/2017  . Onychomycosis 11/04/2017  . VF (ventricular fibrillation) ([place])   . Systolic CHF ([place])   . Paroxysmal VT ([place])   . Nicotine abuse   . Myocardial infarction ([place])   . Migraines   . Lupus (systemic lupus erythematosus) ([place])   . Insomnia   . Depression   . Arthritis of knee   . Chest pain with high risk for cardiac etiology 06/29/2017  . GERD (gastroesophageal reflux disease) 06/29/2017  . Muscle spasm of back 03/04/2017  . Vitamin D deficiency 07/26/2015  . Depression with anxiety 03/11/2015  . Metabolic syndrome X 73/22/0254  . Adhesive capsulitis of left shoulder 09/26/2013  . Ventricular tachycardia (paroxysmal) ([place]) 12/17/2012  . Nicotine dependence 11/07/2012  . HSV-2 seropositive 11/05/2012  . H/O abnormal Pap smear 11/02/2012  . Fatty liver 08/10/2012  . HTN, goal below 130/80 09/13/2011  . Allergic rhinitis 09/11/2011  . Ischemic cardiomyopathy 05/27/2011  . Implantable cardioverter-defibrillator (ICD) in situ 05/27/2011  . 6949 defibrillator lead 05/27/2011  . FATIGUE 06/05/2009  . Ventricular fibrillation ([place]) 02/04/2009  . Morbid obesity ([place]) 12/20/2008  . Diabetes mellitus  type 2 with complications ([place]) 27/01/2375  . Hyperlipidemia LDL goal <100 08/31/2007  . ANXIETY 08/31/2007  . SLE 08/31/2007  . ARTHRITIS, KNEES, BILATERAL 08/31/2007  . AVASCULAR NECROSIS 08/31/2007  . MIGRAINES, HX OF 08/31/2007    Toney Rakes, OT Student 04/23/2018, 5:10 PM  [facility] [address] [place], [place], [address] Phone: [phone]   Fax:  [phone]  Name: Ariyanah Aguado MRN: [id number] Date of Birth: 08-07-1966

## 2018-04-23 NOTE — Therapy (Signed)
[place], [place], [address] Phone: [phone]   Fax:  [phone]  Occupational Therapy Treatment  Patient Details  Name: [name] MRN: [id number] Date of Birth: 07-Jan-1966 Referring Provider: Dr. Sanjuana Kava   Encounter Date: 04/23/2018  OT End of Session - 04/23/18 1656    Visit Number  4    Number of Visits  8    Date for OT Re-Evaluation  05/06/18    Authorization Type  UHC Medicare-no visit limit or copay    OT Start Time  7628   Pt arrived late today   OT Stop Time  1645    OT Time Calculation (min)  32 min    Activity Tolerance  Patient limited by pain    Behavior During Therapy  [facility] for tasks assessed/performed       Past Medical History:  Diagnosis Date  . Arthritis of knee   . Automatic implantable cardiac defibrillator in situ    a. s/p prior Medtronic ICD with 6949 lead. b. s/p Gen change & lead revision 05/2013.  . Cardiomyopathy secondary    Patient has cardiomyopathy out of proportion to her ischemic heart disease  . coronary artery disease    RCA stenting Myoview 2011 EF 30% infarction dilatation without ischemia  . Depression   . Diabetes mellitus   . GERD (gastroesophageal reflux disease)   . Insomnia   . Lupus (systemic lupus erythematosus) ([place])   . Migraines   . Myocardial infarction ([place])    8 total   . Nicotine abuse   . Other and unspecified hyperlipidemia   . Paroxysmal VT ([place])   . Systolic CHF ([place])   . VF (ventricular fibrillation) ([place])    a. Hx appropriate ICD therapy for VF.    Past Surgical History:  Procedure Laterality Date  . ABDOMINAL HYSTERECTOMY    . CHOLECYSTECTOMY    . COLONOSCOPY  07/15/2011   SLF: internal hemorrhoids/hyperplastic polyps in the rectum/tubular adenomaSURVEILLANCE Nov 2017  . defibrillator placed   2009 and 05/2013  . ICD GENERATOR CHANGE  06/17/2013   Dr Caryl Comes  . IMPLANTABLE CARDIOVERTER DEFIBRILLATOR (ICD)  GENERATOR CHANGE Left 06/17/2013   Procedure: ICD GENERATOR CHANGE;  Surgeon: Deboraha Sprang, MD;  Location: [facility] CATH LAB;  Service: Cardiovascular;  Laterality: Left;  . LEAD REVISION N/A 06/17/2013   Procedure: LEAD REVISION;  Surgeon: Deboraha Sprang, MD;  Location[facility] CATH LAB;  Service: Cardiovascular;  Laterality: N/A;  . ROTATOR CUFF REPAIR Right 2002    There were no vitals filed for this visit.  Subjective Assessment - 04/23/18 1612    Subjective   S: I'm feeling terrible today.     Currently in Pain?  Yes    Pain Score  6     Pain Location  Shoulder    Pain Orientation  Left    Pain Descriptors / Indicators  Sharp    Pain Type  Acute pain    Pain Radiating Towards  Elbow    Pain Onset  More than a month ago    Pain Frequency  Constant    Aggravating Factors   Movement, Use of arm    Pain Relieving Factors  Medication    Effect of Pain on Daily Activities  Moderate effect on daily activities.    Multiple Pain Sites  No         [facility] OT Assessment - 04/23/18 1613      Assessment  Medical Diagnosis  chronic left shoulder pain      Precautions   Precautions  None    Precaution Comments  pt has implanted defibrillator               OT Treatments/Exercises (OP) - 04/23/18 1613      Exercises   Exercises  Shoulder      Shoulder Exercises: Supine   Protraction  PROM;5 reps;AAROM;15 reps    Horizontal ABduction Limitations  unable to complete due to pain    External Rotation  PROM;5 reps;AAROM;15 reps    Internal Rotation  PROM;5 reps;AAROM;15 reps    Flexion  PROM;5 reps;AAROM;15 reps    ABduction  PROM;5 reps    ABduction Limitations  Unable to complete A/AROM due to pain      Shoulder Exercises: Seated   Elevation  AROM;12 reps    Extension  AROM;12 reps    Row  AROM;12 reps      Shoulder Exercises: Therapy Ball   Flexion  5 reps    Flexion Limitations  Unable to complete more repetitions due to pain     ABduction  5 reps    ABduction  Limitations  Unable to complete more repetitions due to pain      Shoulder Exercises: ROM/Strengthening   Wall Wash  1'    Thumb Tacks  --      Manual Therapy   Manual Therapy  Myofascial release    Manual therapy comments  completed separately from therapeutic exercise    Myofascial Release  myofascial release to left upper arm, deltoid, trapezius, and scapularis regions to decrease pain and fascial restrictions and increase joint range of motion               OT Short Term Goals - 04/08/18 1618      OT SHORT TERM GOAL #1   Title  Pt will be provided with and educated on HEP to improve mobility required for ADL completion using LUE.     Time  4    Period  Weeks    Status  On-going      OT SHORT TERM GOAL #2   Title  Pt will decrease pain in LUE to 4/10 or less to improve ability to sleep consistently.     Time  4    Period  Weeks    Status  On-going      OT SHORT TERM GOAL #3   Title  Pt will decrease LUE fascial restrictions to minimal amounts or less to improve mobility required for overhead reaching tasks.     Time  4    Period  Weeks    Status  On-going      OT SHORT TERM GOAL #4   Title  Pt will increase LUE ROM to [facility] to improve ability to reach overhead and behind back during dressing tasks.     Time  4    Period  Weeks    Status  On-going      OT SHORT TERM GOAL #5   Title  Pt will increase LUE strength to 4-/5 to improve ability to use LUE as assist during meal preparation.     Time  4    Period  Weeks    Status  On-going               Plan - 04/23/18 1657    Clinical Impression Statement  A: Manual therapy completed to address fascial restrictions limiting ROM.  Pt able to achieve ROM at approximately 50% for flexion and approximately 40% abduction with passive stretching. Continued AA/ROM, wall wash, scapular A/ROM with increased repetitions as tolerated. Pt limited by pain and reportedly feeling ill today. Verbal cuing for form and technique  during exercises.      Plan  P: Begin next session with ultrasound and A/AROM rather than PROM due to guarding response. Decrease supine A/AROM back to 10 repetitions to allow more time for other exercises. Add PVC pipe slide if time allows.        Patient will benefit from skilled therapeutic intervention in order to improve the following deficits and impairments:  Decreased activity tolerance, Decreased strength, Impaired flexibility, Pain, Decreased range of motion, Impaired UE functional use  Visit Diagnosis: Chronic left shoulder pain    Problem List Patient Active Problem List   Diagnosis Date Noted  . Acute shoulder pain due to trauma, left 03/09/2018  . Hyperkalemia 01/01/2018  . Hypermagnesemia 01/01/2018  . Hypokalemia 12/25/2017  . Hypomagnesemia 12/25/2017  . Chronic systolic heart failure ([place]) 12/09/2017  . Onychomycosis 11/04/2017  . VF (ventricular fibrillation) ([place])   . Systolic CHF ([place])   . Paroxysmal VT ([place])   . Nicotine abuse   . Myocardial infarction ([place])   . Migraines   . Lupus (systemic lupus erythematosus) ([place])   . Insomnia   . Depression   . Arthritis of knee   . Chest pain with high risk for cardiac etiology 06/29/2017  . GERD (gastroesophageal reflux disease) 06/29/2017  . Muscle spasm of back 03/04/2017  . Vitamin D deficiency 07/26/2015  . Depression with anxiety 03/11/2015  . Metabolic syndrome X 22/29/7989  . Adhesive capsulitis of left shoulder 09/26/2013  . Ventricular tachycardia (paroxysmal) ([place]) 12/17/2012  . Nicotine dependence 11/07/2012  . HSV-2 seropositive 11/05/2012  . H/O abnormal Pap smear 11/02/2012  . Fatty liver 08/10/2012  . HTN, goal below 130/80 09/13/2011  . Allergic rhinitis 09/11/2011  . Ischemic cardiomyopathy 05/27/2011  . Implantable cardioverter-defibrillator (ICD) in situ 05/27/2011  . 6949 defibrillator lead 05/27/2011  . FATIGUE 06/05/2009  . Ventricular fibrillation ([place]) 02/04/2009  . Morbid obesity  ([place]) 12/20/2008  . Diabetes mellitus type 2 with complications ([place]) 21/19/4174  . Hyperlipidemia LDL goal <100 08/31/2007  . ANXIETY 08/31/2007  . SLE 08/31/2007  . ARTHRITIS, KNEES, BILATERAL 08/31/2007  . AVASCULAR NECROSIS 08/31/2007  . MIGRAINES, HX OF 08/31/2007    Toney Rakes, OT Student 04/23/2018, 5:52 PM  [facility] [address] [place], [place], [address] Phone: [phone]   Fax:  [phone]  Name: [name] MRN: [id number] Date of Birth: 11-04-1965

## 2018-04-26 ENCOUNTER — Ambulatory Visit (INDEPENDENT_AMBULATORY_CARE_PROVIDER_SITE_OTHER): Payer: Medicare Other

## 2018-04-26 ENCOUNTER — Telehealth: Payer: Self-pay

## 2018-04-26 ENCOUNTER — Ambulatory Visit (INDEPENDENT_AMBULATORY_CARE_PROVIDER_SITE_OTHER): Payer: Medicare Other | Admitting: *Deleted

## 2018-04-26 ENCOUNTER — Encounter: Payer: Self-pay | Admitting: Cardiology

## 2018-04-26 DIAGNOSIS — I5022 Chronic systolic (congestive) heart failure: Secondary | ICD-10-CM

## 2018-04-26 DIAGNOSIS — I255 Ischemic cardiomyopathy: Secondary | ICD-10-CM | POA: Diagnosis not present

## 2018-04-26 DIAGNOSIS — T827XXA Infection and inflammatory reaction due to other cardiac and vascular devices, implants and grafts, initial encounter: Secondary | ICD-10-CM

## 2018-04-26 DIAGNOSIS — Z95810 Presence of automatic (implantable) cardiac defibrillator: Secondary | ICD-10-CM | POA: Diagnosis not present

## 2018-04-26 NOTE — Telephone Encounter (Signed)
Remote ICM transmission received.  Attempted call to patient and phone is temporarily not in service.

## 2018-04-26 NOTE — Progress Notes (Signed)
Remote ICD transmission.   

## 2018-04-26 NOTE — Progress Notes (Signed)
EPIC Encounter for ICM Monitoring  Patient Name: [name] is a 52 y.o. female Date: 04/26/2018 Primary Care Physican: Fayrene Helper, MD Primary Cardiologist: Klein/Bensimhon Electrophysiologist: Faustino Congress Weight: Previous [id number] lbs        Attempted call to patient and unable to reach.  Phone is temporarily disconnected. Transmission reviewed.    Thoracic impedance abnormal suggesting fluid accumulation starting 04/02/2018.  Prescribed dosage: Furosemide 20 mg 1 tablet daily   Labs: 04/13/2018 Creatinine 0.84, BUN 5,   Potassium 4.3, Sodium 141, EGFR >60 02/08/2018 Creatinine 0.69, BUN 8, Potassium 3.7, Sodium 139, EGFR 100-116 01/26/2018 Creatinine 0.85, BUN 7, Potassium 3.2, Sodium 137, EGFR >60 01/05/2018 Creatinine0.99, BUN8, Potassium4.6, Sodium136, EGFR>60, Magnesium 2.0 01/02/2018 Creatinine1.50, BUN26, Potassium4.2, Sodium133, TJWW99-27 at 8:28 AM  01/02/2018 Creatinine1.74, BUN29, Potassium6.2, Sodium133, SSQS47-15 at 4:34 AM  01/02/2018 Creatinine1.93, BUN29, Potassium5.0, Sodium135, AQWB86-85 at 1:06 AM  01/01/2018 Creatinine2.59, KIS30, Potassium>7.6 and dropped to 5.8 at (11:11 PM), XEXPFR331, GJGM71-99 at 7:23 PM, Magnesium 2.9 01/01/2018 Creatinine2.07, BUN31, Potassium6.5, Sodium129, OZWR04-75 at 4:13 PM, Magnesium 2.7 12/25/2017 Creatinine0.77, BUN9, Potassium 3.7, Sodium137, EGFR>60 at 6:35 AM  12/25/2017 Creatinine0.84, BUN8, Potassium 2.5, Sodium138, EGFR>60 at 12:17 AM, Magnesium 1.6 09/09/2017 Creatinine 0.74, BUN 7, Potassium 3.9, Sodium 137, EGFR >60  Recommendations: NONE - Unable to reach.  Follow-up plan: ICM clinic phone appointment on 05/13/2018 to recheck fluid levels.   Office appointment scheduled 06/07/2018 with Dr. Haroldine Laws.    Copy of ICM check sent to Dr. Caryl Comes and Dr Haroldine Laws.   3 month ICM trend: 04/26/2018    1 Year ICM trend:       [name],  RN 04/26/2018 12:46 PM

## 2018-04-27 ENCOUNTER — Ambulatory Visit (HOSPITAL_COMMUNITY): Payer: Medicare Other | Admitting: Occupational Therapy

## 2018-04-27 DIAGNOSIS — M25512 Pain in left shoulder: Secondary | ICD-10-CM | POA: Diagnosis not present

## 2018-04-27 DIAGNOSIS — G8929 Other chronic pain: Secondary | ICD-10-CM | POA: Diagnosis not present

## 2018-04-27 DIAGNOSIS — M25612 Stiffness of left shoulder, not elsewhere classified: Secondary | ICD-10-CM | POA: Diagnosis not present

## 2018-04-27 DIAGNOSIS — R29898 Other symptoms and signs involving the musculoskeletal system: Secondary | ICD-10-CM

## 2018-04-27 NOTE — Therapy (Addendum)
[facility] [place], [place], [address] Phone: [phone]   Fax:  [phone]  Occupational Therapy Treatment  Patient Details  Name: [name] MRN: [id number] Date of Birth: 1966-07-29 Referring Provider: Dr. Sanjuana Kava   Encounter Date: 04/27/2018  OT End of Session - 04/27/18 1600    Visit Number  5    Number of Visits  8    Date for OT Re-Evaluation  05/06/18    Authorization Type  UHC Medicare-no visit limit or copay    OT Start Time  1517    OT Stop Time  1557    OT Time Calculation (min)  40 min    Activity Tolerance  Patient limited by pain    Behavior During Therapy  [facility] for tasks assessed/performed       Past Medical History:  Diagnosis Date  . Arthritis of knee   . Automatic implantable cardiac defibrillator in situ    a. s/p prior Medtronic ICD with 6949 lead. b. s/p Gen change & lead revision 05/2013.  . Cardiomyopathy secondary    Patient has cardiomyopathy out of proportion to her ischemic heart disease  . coronary artery disease    RCA stenting Myoview 2011 EF 30% infarction dilatation without ischemia  . Depression   . Diabetes mellitus   . GERD (gastroesophageal reflux disease)   . Insomnia   . Lupus (systemic lupus erythematosus) ([place])   . Migraines   . Myocardial infarction ([place])    8 total   . Nicotine abuse   . Other and unspecified hyperlipidemia   . Paroxysmal VT ([place])   . Systolic CHF ([place])   . VF (ventricular fibrillation) ([place])    a. Hx appropriate ICD therapy for VF.    Past Surgical History:  Procedure Laterality Date  . ABDOMINAL HYSTERECTOMY    . CHOLECYSTECTOMY    . COLONOSCOPY  07/15/2011   SLF: internal hemorrhoids/hyperplastic polyps in the rectum/tubular adenomaSURVEILLANCE Nov 2017  . defibrillator placed   2009 and 05/2013  . ICD GENERATOR CHANGE  06/17/2013   Dr Caryl Comes  . IMPLANTABLE CARDIOVERTER DEFIBRILLATOR (ICD) GENERATOR CHANGE Left  06/17/2013   Procedure: ICD GENERATOR CHANGE;  Surgeon: Deboraha Sprang, MD;  Location: [facility] CATH LAB;  Service: Cardiovascular;  Laterality: Left;  . LEAD REVISION N/A 06/17/2013   Procedure: LEAD REVISION;  Surgeon: Deboraha Sprang, MD;  Location: [facility] CATH LAB;  Service: Cardiovascular;  Laterality: N/A;  . ROTATOR CUFF REPAIR Right 2002    There were no vitals filed for this visit.  Subjective Assessment - 04/27/18 1532    Subjective   S: My shoulder is feeling better today.    Currently in Pain?  Yes    Pain Score  5     Pain Location  Shoulder    Pain Orientation  Left    Pain Descriptors / Indicators  Sharp    Pain Type  Acute pain    Pain Onset  More than a month ago    Pain Frequency  Intermittent    Aggravating Factors   Movement, Use of arm    Pain Relieving Factors  Medication, rest    Effect of Pain on Daily Activities  Moderate effect on activities.          [facility] OT Assessment - 04/27/18 1515      Assessment   Medical Diagnosis  chronic left shoulder pain      Precautions   Precautions  None    Precaution Comments  pt has implanted defibrillator               OT Treatments/Exercises (OP) - 04/27/18 1516      Exercises   Exercises  Shoulder      Shoulder Exercises: Supine   Protraction  AAROM;12 reps    Horizontal ABduction  AAROM;12 reps    External Rotation  AAROM;12 reps    Internal Rotation  AAROM;12 reps    Flexion  AAROM;12 reps    ABduction  AAROM;12 reps      Shoulder Exercises: Seated   Elevation  AROM;12 reps    Extension  AROM;12 reps    Row  AROM;12 reps    Horizontal ABduction  AAROM;Both;Other (comment)   7, unable to complete 10 due to fatigue   External Rotation  AROM;Both;10 reps    Flexion  AAROM;Both;10 reps    Abduction  AAROM;Both;10 reps      Shoulder Exercises: Standing   Other Standing Exercises  PVC pipe slide 10x       Manual Therapy   Manual Therapy  Myofascial release    Manual therapy comments  completed  separately from therapeutic exercise    Myofascial Release  myofascial release to left upper arm, deltoid, trapezius, and scapularis regions to decrease pain and fascial restrictions and increase joint range of motion               OT Short Term Goals - 04/08/18 1618      OT SHORT TERM GOAL #1   Title  Pt will be provided with and educated on HEP to improve mobility required for ADL completion using LUE.     Time  4    Period  Weeks    Status  On-going      OT SHORT TERM GOAL #2   Title  Pt will decrease pain in LUE to 4/10 or less to improve ability to sleep consistently.     Time  4    Period  Weeks    Status  On-going      OT SHORT TERM GOAL #3   Title  Pt will decrease LUE fascial restrictions to minimal amounts or less to improve mobility required for overhead reaching tasks.     Time  4    Period  Weeks    Status  On-going      OT SHORT TERM GOAL #4   Title  Pt will increase LUE ROM to [facility] to improve ability to reach overhead and behind back during dressing tasks.     Time  4    Period  Weeks    Status  On-going      OT SHORT TERM GOAL #5   Title  Pt will increase LUE strength to 4-/5 to improve ability to use LUE as assist during meal preparation.     Time  4    Period  Weeks    Status  On-going               Plan - 04/27/18 1600    Clinical Impression Statement  A: Manual therapy completed to address fascial restrictions limiting ROM. Introduced PVC pipe slide this session. Continued AA/ROM exercises as tolerated. Pt reported experiencing pain during session and required rest breaks due to fatigue. Not able to use ultrasound this session due to reported pain being close to defibrillator. Verbal cuing for form and technique during exercises.      Plan  P: Continue with manual therapy and PVC pipe slides next session. Resume pulley exercises as time allows.        Patient will benefit from skilled therapeutic intervention in order to improve the  following deficits and impairments:  Decreased activity tolerance, Decreased strength, Impaired flexibility, Pain, Decreased range of motion, Impaired UE functional use  Visit Diagnosis: Chronic left shoulder pain  Stiffness of left shoulder, not elsewhere classified  Other symptoms and signs involving the musculoskeletal system    Problem List Patient Active Problem List   Diagnosis Date Noted  . Acute shoulder pain due to trauma, left 03/09/2018  . Hyperkalemia 01/01/2018  . Hypermagnesemia 01/01/2018  . Hypokalemia 12/25/2017  . Hypomagnesemia 12/25/2017  . Chronic systolic heart failure ([place]) 12/09/2017  . Onychomycosis 11/04/2017  . VF (ventricular fibrillation) ([place])   . Systolic CHF ([place])   . Paroxysmal VT ([place])   . Nicotine abuse   . Myocardial infarction ([place])   . Migraines   . Lupus (systemic lupus erythematosus) ([place])   . Insomnia   . Depression   . Arthritis of knee   . Chest pain with high risk for cardiac etiology 06/29/2017  . GERD (gastroesophageal reflux disease) 06/29/2017  . Muscle spasm of back 03/04/2017  . Vitamin D deficiency 07/26/2015  . Depression with anxiety 03/11/2015  . Metabolic syndrome X 10/93/2355  . Adhesive capsulitis of left shoulder 09/26/2013  . Ventricular tachycardia (paroxysmal) ([place]) 12/17/2012  . Nicotine dependence 11/07/2012  . HSV-2 seropositive 11/05/2012  . H/O abnormal Pap smear 11/02/2012  . Fatty liver 08/10/2012  . HTN, goal below 130/80 09/13/2011  . Allergic rhinitis 09/11/2011  . Ischemic cardiomyopathy 05/27/2011  . Implantable cardioverter-defibrillator (ICD) in situ 05/27/2011  . 6949 defibrillator lead 05/27/2011  . FATIGUE 06/05/2009  . Ventricular fibrillation ([place]) 02/04/2009  . Morbid obesity ([place]) 12/20/2008  . Diabetes mellitus type 2 with complications ([place]) 73/22/0254  . Hyperlipidemia LDL goal <100 08/31/2007  . ANXIETY 08/31/2007  . SLE 08/31/2007  . ARTHRITIS, KNEES, BILATERAL 08/31/2007  .  AVASCULAR NECROSIS 08/31/2007  . MIGRAINES, HX OF 08/31/2007    Toney Rakes, OT Student  04/27/2018, 5:42 PM  [facility] [address] [place], [place], [address] Phone: [phone]   Fax:  [phone]  Name: [name] MRN: [id number] Date of Birth: [birth year]

## 2018-04-29 ENCOUNTER — Ambulatory Visit (HOSPITAL_COMMUNITY): Payer: Medicare Other | Admitting: Occupational Therapy

## 2018-04-29 ENCOUNTER — Telehealth (HOSPITAL_COMMUNITY): Payer: Self-pay | Admitting: Family Medicine

## 2018-04-29 NOTE — Telephone Encounter (Signed)
04/29/18  pt called to cx and just said she wanted to cx the appt no reason was given

## 2018-05-03 ENCOUNTER — Telehealth (HOSPITAL_COMMUNITY): Payer: Self-pay | Admitting: *Deleted

## 2018-05-03 ENCOUNTER — Encounter (HOSPITAL_COMMUNITY): Payer: Self-pay

## 2018-05-03 ENCOUNTER — Ambulatory Visit (HOSPITAL_COMMUNITY): Payer: Medicare Other

## 2018-05-03 DIAGNOSIS — M25512 Pain in left shoulder: Secondary | ICD-10-CM | POA: Diagnosis not present

## 2018-05-03 DIAGNOSIS — R29898 Other symptoms and signs involving the musculoskeletal system: Secondary | ICD-10-CM

## 2018-05-03 DIAGNOSIS — M25612 Stiffness of left shoulder, not elsewhere classified: Secondary | ICD-10-CM

## 2018-05-03 DIAGNOSIS — G8929 Other chronic pain: Secondary | ICD-10-CM | POA: Diagnosis not present

## 2018-05-03 DIAGNOSIS — R002 Palpitations: Secondary | ICD-10-CM | POA: Diagnosis not present

## 2018-05-03 NOTE — Telephone Encounter (Signed)
L and HS We will need to see this patient to review the freq nonsustained VT she is having   If we can see her before I leave town would be good  Maybe inconjunction with AS/RU if they are around

## 2018-05-03 NOTE — Therapy (Addendum)
[facility] [place], [place], [address] Phone: [phone]   Fax:  [phone]  Occupational Therapy Treatment  Patient Details  Name: [name] MRN: [id number] Date of Birth: 1965/11/07 Referring Provider: Dr. Sanjuana Kava   Encounter Date: 05/03/2018  OT End of Session - 05/03/18 1624    Visit Number  6    Number of Visits  8    Date for OT Re-Evaluation  05/06/18    Authorization Type  UHC Medicare-no visit limit or copay    OT Start Time  1515    OT Stop Time  1558    OT Time Calculation (min)  43 min    Activity Tolerance  Patient limited by pain    Behavior During Therapy  [facility] for tasks assessed/performed       Past Medical History:  Diagnosis Date  . Arthritis of knee   . Automatic implantable cardiac defibrillator in situ    a. s/p prior Medtronic ICD with 6949 lead. b. s/p Gen change & lead revision 05/2013.  . Cardiomyopathy secondary    Patient has cardiomyopathy out of proportion to her ischemic heart disease  . coronary artery disease    RCA stenting Myoview 2011 EF 30% infarction dilatation without ischemia  . Depression   . Diabetes mellitus   . GERD (gastroesophageal reflux disease)   . Insomnia   . Lupus (systemic lupus erythematosus) ([place])   . Migraines   . Myocardial infarction ([place])    8 total   . Nicotine abuse   . Other and unspecified hyperlipidemia   . Paroxysmal VT ([place])   . Systolic CHF ([place])   . VF (ventricular fibrillation) ([place])    a. Hx appropriate ICD therapy for VF.    Past Surgical History:  Procedure Laterality Date  . ABDOMINAL HYSTERECTOMY    . CHOLECYSTECTOMY    . COLONOSCOPY  07/15/2011   SLF: internal hemorrhoids/hyperplastic polyps in the rectum/tubular adenomaSURVEILLANCE Nov 2017  . defibrillator placed   2009 and 05/2013  . ICD GENERATOR CHANGE  06/17/2013   Dr Caryl Comes  . IMPLANTABLE CARDIOVERTER DEFIBRILLATOR (ICD) GENERATOR CHANGE Left  06/17/2013   Procedure: ICD GENERATOR CHANGE;  Surgeon: Deboraha Sprang, MD;  Location: [facility] CATH LAB;  Service: Cardiovascular;  Laterality: Left;  . LEAD REVISION N/A 06/17/2013   Procedure: LEAD REVISION;  Surgeon: Deboraha Sprang, MD;  Location: [facility] CATH LAB;  Service: Cardiovascular;  Laterality: N/A;  . ROTATOR CUFF REPAIR Right 2002    There were no vitals filed for this visit.  Subjective Assessment - 05/03/18 1519    Subjective   S: My shoulder is about the same today.     Currently in Pain?  Yes    Pain Score  5     Pain Location  Shoulder    Pain Orientation  Left    Pain Descriptors / Indicators  Aching    Pain Type  Acute pain    Pain Radiating Towards  Elbow    Pain Onset  More than a month ago    Pain Frequency  Constant    Aggravating Factors   Use of arm, movement     Pain Relieving Factors  Medication     Effect of Pain on Daily Activities  Mod effect on daily activities     Multiple Pain Sites  No         [facility] OT Assessment - 05/03/18 1518  Assessment   Medical Diagnosis  chronic left shoulder pain      Precautions   Precautions  None    Precaution Comments  pt has implanted defibrillator               OT Treatments/Exercises (OP) - 05/03/18 1518      Exercises   Exercises  Shoulder      Shoulder Exercises: Supine   Protraction  AAROM;15 reps    Horizontal ABduction  AAROM   11; unable to complete 15 due to pain   External Rotation  AAROM;10 reps   Unable to complete 15 due to pain   Internal Rotation  AAROM;15 reps    Flexion  AAROM;15 reps    ABduction  AAROM;15 reps      Shoulder Exercises: Seated   Elevation  AROM;12 reps    Extension  AROM;15 reps    Row  AROM;12 reps    Flexion  AROM;12 reps      Shoulder Exercises: Standing   Other Standing Exercises  PVC pipe slide 15x       Shoulder Exercises: Pulleys   Flexion  1 minute    Scaption  1 minute      Shoulder Exercises: ROM/Strengthening   Wall Wash  1'       Modalities   Modalities  Moist Heat      Moist Heat Therapy   Number Minutes Moist Heat  5 Minutes    Moist Heat Location  Shoulder      Manual Therapy   Manual Therapy  Myofascial release    Manual therapy comments  completed separately from therapeutic exercise    Myofascial Release  myofascial release to left upper arm, deltoid, trapezius, and scapularis regions to decrease pain and fascial restrictions and increase joint range of motion               OT Short Term Goals - 04/08/18 1618      OT SHORT TERM GOAL #1   Title  Pt will be provided with and educated on HEP to improve mobility required for ADL completion using LUE.     Time  4    Period  Weeks    Status  On-going      OT SHORT TERM GOAL #2   Title  Pt will decrease pain in LUE to 4/10 or less to improve ability to sleep consistently.     Time  4    Period  Weeks    Status  On-going      OT SHORT TERM GOAL #3   Title  Pt will decrease LUE fascial restrictions to minimal amounts or less to improve mobility required for overhead reaching tasks.     Time  4    Period  Weeks    Status  On-going      OT SHORT TERM GOAL #4   Title  Pt will increase LUE ROM to [facility] to improve ability to reach overhead and behind back during dressing tasks.     Time  4    Period  Weeks    Status  On-going      OT SHORT TERM GOAL #5   Title  Pt will increase LUE strength to 4-/5 to improve ability to use LUE as assist during meal preparation.     Time  4    Period  Weeks    Status  On-going  Plan - 05/03/18 1626    Clinical Impression Statement  A: Manual therapy completed to address fascial restrictions that limit ROM. She reported pain during session and required rest breaks due to pain and fatigue. Pt was not able to tolerate all exercises and modifications were made as needed, including scaption completed instead of abduction during pulley exercises. Pt was given 5 minutes of moist heat on shoulder  for pain relief. Verbal cuing for form and technique required.      Plan  P: Continue with manual therapy and use of moist heat to relieve pain PRN. Continue to hold passive ROM due to muscle guarding.  Continue. Follow up on MD appointment. Reassessment and FOTO.      Patient will benefit from skilled therapeutic intervention in order to improve the following deficits and impairments:  Decreased activity tolerance, Decreased strength, Impaired flexibility, Pain, Decreased range of motion, Impaired UE functional use  Visit Diagnosis: Other symptoms and signs involving the musculoskeletal system  Acute pain of left shoulder  Stiffness of left shoulder, not elsewhere classified    Problem List Patient Active Problem List   Diagnosis Date Noted  . Acute shoulder pain due to trauma, left 03/09/2018  . Hyperkalemia 01/01/2018  . Hypermagnesemia 01/01/2018  . Hypokalemia 12/25/2017  . Hypomagnesemia 12/25/2017  . Chronic systolic heart failure ([place]) 12/09/2017  . Onychomycosis 11/04/2017  . VF (ventricular fibrillation) ([place])   . Systolic CHF ([place])   . Paroxysmal VT ([place])   . Nicotine abuse   . Myocardial infarction ([place])   . Migraines   . Lupus (systemic lupus erythematosus) ([place])   . Insomnia   . Depression   . Arthritis of knee   . Chest pain with high risk for cardiac etiology 06/29/2017  . GERD (gastroesophageal reflux disease) 06/29/2017  . Muscle spasm of back 03/04/2017  . Vitamin D deficiency 07/26/2015  . Depression with anxiety 03/11/2015  . Metabolic syndrome X 19/37/9024  . Adhesive capsulitis of left shoulder 09/26/2013  . Ventricular tachycardia (paroxysmal) ([place]) 12/17/2012  . Nicotine dependence 11/07/2012  . HSV-2 seropositive 11/05/2012  . H/O abnormal Pap smear 11/02/2012  . Fatty liver 08/10/2012  . HTN, goal below 130/80 09/13/2011  . Allergic rhinitis 09/11/2011  . Ischemic cardiomyopathy 05/27/2011  . Implantable cardioverter-defibrillator (ICD) in  situ 05/27/2011  . 6949 defibrillator lead 05/27/2011  . FATIGUE 06/05/2009  . Ventricular fibrillation ([place]) 02/04/2009  . Morbid obesity ([place]) 12/20/2008  . Diabetes mellitus type 2 with complications ([place]) 09/73/5329  . Hyperlipidemia LDL goal <100 08/31/2007  . ANXIETY 08/31/2007  . SLE 08/31/2007  . ARTHRITIS, KNEES, BILATERAL 08/31/2007  . AVASCULAR NECROSIS 08/31/2007  . MIGRAINES, HX OF 08/31/2007    Toney Rakes, OT Student  05/03/2018, 4:38 PM  [facility] [address] [place], [place], [address] Phone: [phone]   Fax:  [phone]  Name: [name] MRN: [id number] Date of Birth: 03-24-1966

## 2018-05-03 NOTE — Telephone Encounter (Signed)
Received call report from [place], pt wore her zio patch for 10 days, it showed over 7000 runs of VT during that time.  Monitor was placed on 04/13/18, labs done that day revealed normal mag and K levels.  Report imported into epic and sent to Dr Haroldine Laws for review.  Message routed to him and Dr Caryl Comes, who manages pt's VT

## 2018-05-04 ENCOUNTER — Encounter: Payer: Self-pay | Admitting: Orthopaedic Surgery

## 2018-05-04 ENCOUNTER — Ambulatory Visit (INDEPENDENT_AMBULATORY_CARE_PROVIDER_SITE_OTHER): Payer: Medicare Other | Admitting: Orthopaedic Surgery

## 2018-05-04 VITALS — BP 87/53 | HR 46 | Ht 66.0 in | Wt 209.0 lb

## 2018-05-04 DIAGNOSIS — F17210 Nicotine dependence, cigarettes, uncomplicated: Secondary | ICD-10-CM | POA: Diagnosis not present

## 2018-05-04 DIAGNOSIS — M25512 Pain in left shoulder: Secondary | ICD-10-CM

## 2018-05-04 DIAGNOSIS — G8929 Other chronic pain: Secondary | ICD-10-CM

## 2018-05-04 NOTE — Telephone Encounter (Signed)
Attempted to call pt to let her know about results and need to see Dr Caryl Comes, however line just rings and then states "call can not be completed at this time" and disconnects, will try to pt again later.

## 2018-05-04 NOTE — Progress Notes (Signed)
Patient [name], female DOB:01-20-1966, 52 y.o. GGE:[id number]  Chief Complaint  Patient presents with  . Shoulder Pain    left / better with therapy     HPI  [name] is a 52 y.o. female who has pain of the left shoulder.  She has been to PT/OT and is improved.  She has still pain and lack of full motion but her pain is less.  She is doing her exercises at home.  She has no numbness.  She has no new trauma.   Body mass index is 33.73 kg/m.  ROS  Review of Systems  Constitutional: Positive for activity change.  Musculoskeletal: Positive for arthralgias and myalgias.  All other systems reviewed and are negative.   All other systems reviewed and are negative.  The following is a summary of the past history medically, past history surgically, known current medicines, social history and family history.  This information is gathered electronically by the computer from prior information and documentation.  I review this each visit and have found including this information at this point in the chart is beneficial and informative.    Past Medical History:  Diagnosis Date  . Arthritis of knee   . Automatic implantable cardiac defibrillator in situ    a. s/p prior Medtronic ICD with 6949 lead. b. s/p Gen change & lead revision 05/2013.  . Cardiomyopathy secondary    Patient has cardiomyopathy out of proportion to her ischemic heart disease  . coronary artery disease    RCA stenting Myoview 2011 EF 30% infarction dilatation without ischemia  . Depression   . Diabetes mellitus   . GERD (gastroesophageal reflux disease)   . Insomnia   . Lupus (systemic lupus erythematosus) ([place])   . Migraines   . Myocardial infarction ([place])    8 total   . Nicotine abuse   . Other and unspecified hyperlipidemia   . Paroxysmal VT ([place])   . Systolic CHF ([place])   . VF (ventricular fibrillation) ([place])    a. Hx appropriate ICD therapy for VF.    Past Surgical History:   Procedure Laterality Date  . ABDOMINAL HYSTERECTOMY    . CHOLECYSTECTOMY    . COLONOSCOPY  07/15/2011   SLF: internal hemorrhoids/hyperplastic polyps in the rectum/tubular adenomaSURVEILLANCE Nov 2017  . defibrillator placed   2009 and 05/2013  . ICD GENERATOR CHANGE  06/17/2013   Dr Caryl Comes  . IMPLANTABLE CARDIOVERTER DEFIBRILLATOR (ICD) GENERATOR CHANGE Left 06/17/2013   Procedure: ICD GENERATOR CHANGE;  Surgeon: Deboraha Sprang, MD;  Location: [facility] CATH LAB;  Service: Cardiovascular;  Laterality: Left;  . LEAD REVISION N/A 06/17/2013   Procedure: LEAD REVISION;  Surgeon: Deboraha Sprang, MD;  Location: [facility] CATH LAB;  Service: Cardiovascular;  Laterality: N/A;  . ROTATOR CUFF REPAIR Right 2002    Family History  Problem Relation Age of Onset  . Hepatitis Mother 15       HCV  . Liver cancer Mother 38  . Cancer Mother   . Diabetes Mother   . Heart disease Mother   . Hyperlipidemia Mother   . Stroke Sister 45       x3  . Diabetes Sister   . Hyperlipidemia Sister   . Dementia Father   . HIV Sister   . Diabetes Brother   . Heart disease Brother   . Hyperlipidemia Brother     Social History Social History   Tobacco Use  . Smoking status: Current Some Day Smoker  Packs/day: 0.50    Years: 15.00    Pack years: 7.50    Types: Cigarettes  . Smokeless tobacco: Never Used  Substance Use Topics  . Alcohol use: Yes    Alcohol/week: 0.0 standard drinks    Comment: occasionaly  . Drug use: No    Allergies  Allergen Reactions  . Amiodarone     Headache and facial swelling  . Bee Venom Anaphylaxis  . Mexiletine   . Cortisone Swelling  . Robaxin [Methocarbamol] Hives and Itching    Current Outpatient Medications  Medication Sig Dispense Refill  . albuterol (PROVENTIL HFA;VENTOLIN HFA) 108 (90 Base) MCG/ACT inhaler Inhale 2 puffs into the lungs every 6 (six) hours as needed for wheezing or shortness of breath. 1 Inhaler 0  . ALPRAZolam (XANAX) 1 MG tablet Take 1 tablet (1  mg total) by mouth at bedtime. 30 tablet 4  . aspirin 81 MG tablet Take 1 tablet (81 mg total) by mouth daily. 30 tablet 6  . atorvastatin (LIPITOR) 10 MG tablet Take 1 tablet (10 mg total) by mouth daily. 90 tablet 2  . cholecalciferol (VITAMIN D) 1000 units tablet Take 1,000 Units by mouth daily.    . clopidogrel (PLAVIX) 75 MG tablet Take 1 tablet (75 mg total) by mouth daily. 30 tablet 3  . cyclobenzaprine (FLEXERIL) 5 MG tablet Take 1 tablet (5 mg total) by mouth at bedtime. 30 tablet 4  . digoxin (LANOXIN) 0.125 MG tablet Take 1 tablet (0.125 mg total) by mouth daily. 90 tablet 3  . fenofibrate (TRICOR) 145 MG tablet Take 145 mg by mouth daily.     . furosemide (LASIX) 20 MG tablet Take 20 mg by mouth daily.    Marland Kitchen losartan (COZAAR) 25 MG tablet Take 25 mg by mouth daily.    . montelukast (SINGULAIR) 10 MG tablet TAKE 1 TABLET BY MOUTH ONCE A DAY AT BEDTIME. 30 tablet 0  . naproxen (NAPROSYN) 500 MG tablet Take 1 tablet (500 mg total) by mouth 2 (two) times daily with a meal. 60 tablet 5  . oxyCODONE (ROXICODONE) 15 MG immediate release tablet Take by mouth.    . carvedilol (COREG) 12.5 MG tablet Take 1 tablet (12.5 mg total) by mouth 2 (two) times daily. 60 tablet 6   No current facility-administered medications for this visit.      Physical Exam  Blood pressure (!) 87/53, pulse (!) 46, height 5\' 6"  (1.676 m), weight 209 lb (94.8 kg).  Constitutional: overall normal hygiene, normal nutrition, well developed, normal grooming, normal body habitus. Assistive device:none  Musculoskeletal: gait and station Limp none, muscle tone and strength are normal, no tremors or atrophy is present.  .  Neurological: coordination overall normal.  Deep tendon reflex/nerve stretch intact.  Sensation normal.  Cranial nerves II-XII intact.   Skin:   Normal overall no scars, lesions, ulcers or rashes. No psoriasis.  Psychiatric: Alert and oriented x 3.  Recent memory intact, remote memory unclear.   Normal mood and affect. Well groomed.  Good eye contact.  Cardiovascular: overall no swelling, no varicosities, no edema bilaterally, normal temperatures of the legs and arms, no clubbing, cyanosis and good capillary refill.  Lymphatic: palpation is normal.  Examination of left Upper Extremity is done.  Inspection:   Overall:  Elbow non-tender without crepitus or defects, forearm non-tender without crepitus or defects, wrist non-tender without crepitus or defects, hand non-tender.    Shoulder: with glenohumeral joint tenderness, without effusion.   Upper arm: without swelling  and tenderness   Range of motion:   Overall:  Full range of motion of the elbow, full range of motion of wrist and full range of motion in fingers.   Shoulder:  left  145 degrees forward flexion; 120 degrees abduction; 35 degrees internal rotation, 35 degrees external rotation, 15 degrees extension, 40 degrees adduction.   Stability:   Overall:  Shoulder, elbow and wrist stable   Strength and Tone:   Overall full shoulder muscles strength, full upper arm strength and normal upper arm bulk and tone.  All other systems reviewed and are negative   The patient has been educated about the nature of the problem(s) and counseled on treatment options.  The patient appeared to understand what I have discussed and is in agreement with it.  Encounter Diagnoses  Name Primary?  . Chronic left shoulder pain Yes  . Cigarette nicotine dependence without complication     PLAN Call if any problems.  Precautions discussed.  Continue current medications.   Return to clinic 3 weeks   Continue PT/OT.  Electronically Signed Sanjuana Kava, MD 9/17/20193:03 PM

## 2018-05-04 NOTE — Telephone Encounter (Signed)
Thanks Steve

## 2018-05-06 ENCOUNTER — Encounter

## 2018-05-06 NOTE — Telephone Encounter (Signed)
Have attempted to call pt and her daughter (emergency contact) several times as well and both lines continue to ring busy.

## 2018-05-06 NOTE — Telephone Encounter (Signed)
Attempted to call pt; no answer with busy signal on several attempts.

## 2018-05-07 ENCOUNTER — Encounter (HOSPITAL_COMMUNITY): Payer: Self-pay | Admitting: *Deleted

## 2018-05-07 NOTE — Telephone Encounter (Signed)
Still unable to reach pt, letter has been mailed

## 2018-05-11 ENCOUNTER — Encounter (HOSPITAL_COMMUNITY): Payer: Self-pay | Admitting: Occupational Therapy

## 2018-05-11 ENCOUNTER — Encounter: Payer: Self-pay | Admitting: Internal Medicine

## 2018-05-11 ENCOUNTER — Ambulatory Visit (HOSPITAL_COMMUNITY): Payer: Medicare Other | Admitting: Occupational Therapy

## 2018-05-11 DIAGNOSIS — G8929 Other chronic pain: Secondary | ICD-10-CM | POA: Diagnosis not present

## 2018-05-11 DIAGNOSIS — R29898 Other symptoms and signs involving the musculoskeletal system: Secondary | ICD-10-CM | POA: Diagnosis not present

## 2018-05-11 DIAGNOSIS — M25512 Pain in left shoulder: Secondary | ICD-10-CM | POA: Diagnosis not present

## 2018-05-11 DIAGNOSIS — M25612 Stiffness of left shoulder, not elsewhere classified: Secondary | ICD-10-CM | POA: Diagnosis not present

## 2018-05-11 NOTE — Therapy (Addendum)
[place], [place], [address] Phone: [phone]   Fax:  [phone]  Occupational Therapy Treatment and Reassessment  (recertification) Patient Details  Name: [name] MRN: [id number] Date of Birth: [birth year] Referring Provider: Dr. Sanjuana Kava   Encounter Date: 05/11/2018  OT End of Session - 05/11/18 1611    Visit Number  7    Number of Visits  11    Date for OT Re-Evaluation  06/08/18    Authorization Type  [employer] Medicare-no visit limit or copay    OT Start Time  1527   Pt arrived late to session.   OT Stop Time  1613    OT Time Calculation (min)  46 min    Activity Tolerance  Patient limited by pain    Behavior During Therapy  [facility] for tasks assessed/performed       Past Medical History:  Diagnosis Date  . Arthritis of knee   . Automatic implantable cardiac defibrillator in situ    a. s/p prior Medtronic ICD with 6949 lead. b. s/p Gen change & lead revision 05/2013.  . Cardiomyopathy secondary    Patient has cardiomyopathy out of proportion to her ischemic heart disease  . coronary artery disease    RCA stenting Myoview 2011 EF 30% infarction dilatation without ischemia  . Depression   . Diabetes mellitus   . GERD (gastroesophageal reflux disease)   . Insomnia   . Lupus (systemic lupus erythematosus) ([place])   . Migraines   . Myocardial infarction ([place])    8 total   . Nicotine abuse   . Other and unspecified hyperlipidemia   . Paroxysmal VT ([place])   . Systolic CHF ([place])   . VF (ventricular fibrillation) ([place])    a. Hx appropriate ICD therapy for VF.    Past Surgical History:  Procedure Laterality Date  . ABDOMINAL HYSTERECTOMY    . CHOLECYSTECTOMY    . COLONOSCOPY  07/15/2011   SLF: internal hemorrhoids/hyperplastic polyps in the rectum/tubular adenomaSURVEILLANCE Nov 2017  . defibrillator placed   2009 and 05/2013  . ICD GENERATOR CHANGE  06/17/2013   Dr Caryl Comes  .  IMPLANTABLE CARDIOVERTER DEFIBRILLATOR (ICD) GENERATOR CHANGE Left 06/17/2013   Procedure: ICD GENERATOR CHANGE;  Surgeon: Deboraha Sprang, MD;  Location: [facility] CATH LAB;  Service: Cardiovascular;  Laterality: Left;  . LEAD REVISION N/A 06/17/2013   Procedure: LEAD REVISION;  Surgeon: Deboraha Sprang, MD;  Location: [facility] CATH LAB;  Service: Cardiovascular;  Laterality: N/A;  . ROTATOR CUFF REPAIR Right 2002    There were no vitals filed for this visit.  Subjective Assessment - 05/11/18 1609    Subjective   S: I always hurt when I leave here.     Currently in Pain?  Yes    Pain Score  5     Pain Location  Shoulder    Pain Orientation  Left    Pain Descriptors / Indicators  Aching    Pain Type  Acute pain    Pain Radiating Towards  Elbow    Pain Onset  More than a month ago    Pain Frequency  Intermittent    Aggravating Factors   Reaching, Lifting    Pain Relieving Factors  Heat, Medication     Effect of Pain on Daily Activities  Mod effect on daily activities.     Multiple Pain Sites  No         [facility] OT Assessment -  05/11/18 1533      Assessment   Medical Diagnosis  chronic left shoulder pain      Precautions   Precautions  None    Precaution Comments  pt has implanted defibrillator      Observation/Other Assessments   Focus on Therapeutic Outcomes (FOTO)   52/100   previous 51/100     ROM / Strength   AROM / PROM / Strength  AROM;Strength      AROM   Overall AROM   Deficits    Overall AROM Comments  Assessed seated, er/IR adducted    AROM Assessment Site  Shoulder    Right/Left Shoulder  Left    Left Shoulder Flexion  103 Degrees   previous 68   Left Shoulder ABduction  95 Degrees   previous 49   Left Shoulder Internal Rotation  90 Degrees   previous 90   Left Shoulder External Rotation  44 Degrees   previous 32     Strength   Overall Strength  Deficits    Overall Strength Comments  Assessed seated, based on observations      Strength Assessment Site  Shoulder     Right/Left Shoulder  Left    Left Shoulder Flexion  3/5   previous 3-/5   Left Shoulder ABduction  3/5   previous 3-/5   Left Shoulder Internal Rotation  3/5   previous 3-/5   Left Shoulder External Rotation  3/5   previous 3-/5              OT Treatments/Exercises (OP) - 05/11/18 1538      Exercises   Exercises  Shoulder      Shoulder Exercises: Supine   Protraction  AAROM;15 reps    Horizontal ABduction  AAROM;15 reps    External Rotation  AAROM;15 reps    Internal Rotation  AAROM;15 reps    Flexion  AAROM;15 reps    ABduction  AAROM;5 reps    ABduction Limitations  Unable to complete A/AROM due to pain      Shoulder Exercises: Pulleys   Flexion  1 minute    Scaption  1 minute      Functional Reaching Activities   High Level   Pt placed cones from counter to high level shelf and then back to counter to focus on functional reaching. Ten cones used this session. Verbal cues reminding her to not stand on toes.        Modalities   Modalities  Moist Heat      Moist Heat Therapy   Number Minutes Moist Heat  12 Minutes    Moist Heat Location  Shoulder               OT Short Term Goals - 05/11/18 1648      OT SHORT TERM GOAL #1   Title  Pt will be provided with and educated on HEP to improve mobility required for ADL completion using LUE.     Time  4    Period  Weeks    Status  On-going      OT SHORT TERM GOAL #2   Title  Pt will decrease pain in LUE to 4/10 or less to improve ability to sleep consistently.     Time  4    Period  Weeks    Status  On-going      OT SHORT TERM GOAL #3   Title  Pt will decrease LUE fascial restrictions to minimal amounts or  less to improve mobility required for overhead reaching tasks.     Time  4    Period  Weeks    Status  On-going      OT SHORT TERM GOAL #4   Title  Pt will increase LUE ROM to [facility] to improve ability to reach overhead and behind back during dressing tasks.     Time  4    Period  Weeks     Status  On-going      OT SHORT TERM GOAL #5   Title  Pt will increase LUE strength to 4-/5 to improve ability to use LUE as assist during meal preparation.     Time  4    Period  Weeks    Status  On-going               Plan - 05/11/18 1623    Clinical Impression Statement  A: Pt participated in reassessment this session. She has shown progress in improved ROM, decreased fascial restrictions, and increased functional reach since initial evaluation. Pt reports that she would like to attend OT sessions 1X instead of twice a week due to issues with transportation. She reports greater ease reaching behind back, although she reports avoiding use of left arm to reach or lift weighted objects due to fear of pain. Unable to complete P/ROM at this time due to increased guarding. Continued working on improving shoulder ROM and stabilization will help improve functional use of UE during ADLs.      Occupational performance deficits (Please refer to evaluation for details):  ADL's;IADL's;Leisure;Social Participation    Rehab Potential  Good    Current Impairments/barriers affecting progress:  Left upper extremity deficits include decreased strength, ROM, activity tolerance and increased pain that limit functional use of UE.      OT Frequency  1x / week    OT Duration  4 weeks    OT Treatment/Interventions  Self-care/ADL training;Therapeutic exercise;Manual Therapy;Therapeutic activities;Cryotherapy;Moist Heat;Contrast Bath;Passive range of motion;Patient/family education    Plan  P: Continue treatment 1X/week for 4 additional weeks. Continue working on increasing shoulder strength and flexibility required for functional use of non-dominant UE during functional tasks. Update HEP next session to include theraband.       Consulted and Agree with Plan of Care  Patient       Patient will benefit from skilled therapeutic intervention in order to improve the following deficits and impairments:  Decreased  activity tolerance, Decreased strength, Impaired flexibility, Pain, Decreased range of motion, Impaired UE functional use  Visit Diagnosis: Acute pain of left shoulder  Other symptoms and signs involving the musculoskeletal system    Problem List Patient Active Problem List   Diagnosis Date Noted  . Acute shoulder pain due to trauma, left 03/09/2018  . Hyperkalemia 01/01/2018  . Hypermagnesemia 01/01/2018  . Hypokalemia 12/25/2017  . Hypomagnesemia 12/25/2017  . Chronic systolic heart failure ([place]) 12/09/2017  . Onychomycosis 11/04/2017  . VF (ventricular fibrillation) ([place])   . Systolic CHF ([place])   . Paroxysmal VT ([place])   . Nicotine abuse   . Myocardial infarction ([place])   . Migraines   . Lupus (systemic lupus erythematosus) ([place])   . Insomnia   . Depression   . Arthritis of knee   . Chest pain with high risk for cardiac etiology 06/29/2017  . GERD (gastroesophageal reflux disease) 06/29/2017  . Muscle spasm of back 03/04/2017  . Vitamin D deficiency 07/26/2015  . Depression with anxiety 03/11/2015  .  Metabolic syndrome X 69/79/4801  . Adhesive capsulitis of left shoulder 09/26/2013  . Ventricular tachycardia (paroxysmal) ([place]) 12/17/2012  . Nicotine dependence 11/07/2012  . HSV-2 seropositive 11/05/2012  . H/O abnormal Pap smear 11/02/2012  . Fatty liver 08/10/2012  . HTN, goal below 130/80 09/13/2011  . Allergic rhinitis 09/11/2011  . Ischemic cardiomyopathy 05/27/2011  . Implantable cardioverter-defibrillator (ICD) in situ 05/27/2011  . 6949 defibrillator lead 05/27/2011  . FATIGUE 06/05/2009  . Ventricular fibrillation ([place]) 02/04/2009  . Morbid obesity ([place]) 12/20/2008  . Diabetes mellitus type 2 with complications ([place]) 65/53/7482  . Hyperlipidemia LDL goal <100 08/31/2007  . ANXIETY 08/31/2007  . SLE 08/31/2007  . ARTHRITIS, KNEES, BILATERAL 08/31/2007  . AVASCULAR NECROSIS 08/31/2007  . MIGRAINES, HX OF 08/31/2007    Toney Rakes, OT Student   05/11/2018, 5:29 PM  [facility] [address] [place], [place], [address] Phone: [phone]   Fax:  [phone]  Name: [name] MRN: [id number] Date of Birth: [birth year]    Guadelupe Sabin, OTR/L  [phone] 05/11/2018

## 2018-05-12 ENCOUNTER — Encounter: Payer: Self-pay | Admitting: Internal Medicine

## 2018-05-13 ENCOUNTER — Ambulatory Visit (INDEPENDENT_AMBULATORY_CARE_PROVIDER_SITE_OTHER): Payer: Medicare Other

## 2018-05-13 ENCOUNTER — Telehealth: Payer: Self-pay

## 2018-05-13 DIAGNOSIS — I5022 Chronic systolic (congestive) heart failure: Secondary | ICD-10-CM

## 2018-05-13 DIAGNOSIS — T827XXA Infection and inflammatory reaction due to other cardiac and vascular devices, implants and grafts, initial encounter: Secondary | ICD-10-CM

## 2018-05-13 NOTE — Progress Notes (Signed)
EPIC Encounter for ICM Monitoring  Patient Name: [name] is a 52 y.o. female Date: 05/13/2018 Primary Care Physican: Fayrene Helper, MD Primary Cardiologist: Klein/Bensimhon Electrophysiologist: Faustino Congress Weight: Previous [id number] lbs       Attempted call to patient and unable to reach.    Transmission reviewed.    Thoracic impedance normal on 05/13/2018 but was abnormal suggesting fluid accumulation from 04/08/2018.  Prescribed: Furosemide 20 mg 1 tablet daily   Labs: 04/13/2018 Creatinine 0.84, BUN 5,   Potassium 4.3, Sodium 141, EGFR >60 02/08/2018 Creatinine 0.69, BUN 8, Potassium 3.7, Sodium 139, EGFR 100-116 01/26/2018 Creatinine 0.85, BUN 7, Potassium 3.2, Sodium 137, EGFR >60 01/05/2018 Creatinine0.99, BUN8, Potassium4.6, Sodium136, EGFR>60, Magnesium 2.0 01/02/2018 Creatinine1.50, BUN26, Potassium4.2, Sodium133, [id number] at 8:28 AM  01/02/2018 Creatinine1.74, BUN29, Potassium6.2, Sodium133, [id number] at 4:34 AM  01/02/2018 Creatinine1.93, BUN29, Potassium5.0, Sodium135, [id number] at 1:06 AM  01/01/2018 Creatinine2.59, VLR17, Potassium>7.6 and dropped to 5.8 at (11:11 PM), [id number], SYPZ58-06 at 7:23 PM, Magnesium 2.9 01/01/2018 Creatinine2.07, BUN31, Potassium6.5, Sodium129, BEQU54-88 at 4:13 PM, Magnesium 2.7 12/25/2017 Creatinine0.77, BUN9, Potassium 3.7, Sodium137, EGFR>60 at 6:35 AM  12/25/2017 Creatinine0.84, BUN8, Potassium 2.5, Sodium138, EGFR>60 at 12:17 AM, Magnesium 1.6 09/09/2017 Creatinine 0.74, BUN 7, Potassium 3.9, Sodium 137, EGFR >60  Recommendations: Unable to reach.  Follow-up plan: ICM clinic phone appointment on 06/03/2018.       Copy of ICM check sent to Dr. Caryl Comes.   3 month ICM trend: 05/13/2018    1 Year ICM trend:       [name], RN 05/13/2018 4:19 PM

## 2018-05-13 NOTE — Telephone Encounter (Signed)
Remote ICM transmission received.  Attempted call to patient and line busy x 2.   

## 2018-05-15 LAB — CUP PACEART REMOTE DEVICE CHECK
Battery Remaining Longevity: 88 mo
Battery Voltage: 2.98 V
Brady Statistic RV Percent Paced: 0.01 %
Date Time Interrogation Session: 20190909103428
HighPow Impedance: 80 Ohm
Implantable Lead Implant Date: 20141031
Implantable Lead Location: 753860
Implantable Lead Model: 6935
Implantable Pulse Generator Implant Date: 20141031
Lead Channel Impedance Value: 418 Ohm
Lead Channel Impedance Value: 532 Ohm
Lead Channel Pacing Threshold Amplitude: 0.5 V
Lead Channel Pacing Threshold Pulse Width: 0.4 ms
Lead Channel Sensing Intrinsic Amplitude: 7.375 mV
Lead Channel Sensing Intrinsic Amplitude: 7.375 mV
Lead Channel Setting Pacing Amplitude: 2.5 V
Lead Channel Setting Pacing Pulse Width: 0.4 ms
Lead Channel Setting Sensing Sensitivity: 0.3 mV

## 2018-05-17 ENCOUNTER — Telehealth (HOSPITAL_COMMUNITY): Payer: Self-pay | Admitting: Family Medicine

## 2018-05-17 NOTE — Telephone Encounter (Signed)
05/17/18  I left patient a message to let her know that Magda Paganini had asked me to schedule her more visits.  I let her know about the 10/1 appt and if she had any questions she could call us.

## 2018-05-18 ENCOUNTER — Telehealth (HOSPITAL_COMMUNITY): Payer: Self-pay | Admitting: Family Medicine

## 2018-05-18 ENCOUNTER — Ambulatory Visit (HOSPITAL_COMMUNITY): Payer: Medicare Other | Admitting: Occupational Therapy

## 2018-05-18 NOTE — Telephone Encounter (Signed)
05/18/18  pt needs after 3 appt and we just didn't have anything available today... patient called on 9/30 to tell me this

## 2018-05-20 ENCOUNTER — Telehealth (HOSPITAL_COMMUNITY): Payer: Self-pay | Admitting: Family Medicine

## 2018-05-20 ENCOUNTER — Other Ambulatory Visit: Payer: Self-pay | Admitting: Internal Medicine

## 2018-05-20 NOTE — Telephone Encounter (Signed)
05/20/18  10/3 I left a message to see if patient could and still wanted to come in at 4:00 on this day... she had a 1:45 originally but I saw a cancellation and called her

## 2018-05-24 ENCOUNTER — Encounter: Payer: Self-pay | Admitting: Internal Medicine

## 2018-05-24 ENCOUNTER — Ambulatory Visit (INDEPENDENT_AMBULATORY_CARE_PROVIDER_SITE_OTHER): Payer: Medicare Other | Admitting: Internal Medicine

## 2018-05-24 VITALS — BP 110/86 | HR 83 | Ht 66.0 in | Wt 205.4 lb

## 2018-05-24 DIAGNOSIS — I472 Ventricular tachycardia, unspecified: Secondary | ICD-10-CM

## 2018-05-24 DIAGNOSIS — T827XXA Infection and inflammatory reaction due to other cardiac and vascular devices, implants and grafts, initial encounter: Secondary | ICD-10-CM

## 2018-05-24 DIAGNOSIS — I255 Ischemic cardiomyopathy: Secondary | ICD-10-CM

## 2018-05-24 DIAGNOSIS — I4729 Other ventricular tachycardia: Secondary | ICD-10-CM

## 2018-05-24 DIAGNOSIS — I5022 Chronic systolic (congestive) heart failure: Secondary | ICD-10-CM

## 2018-05-24 MED ORDER — LOSARTAN POTASSIUM 25 MG PO TABS: 25.0000 mg | ORAL_TABLET | Freq: Every day | ORAL | 3 refills | Status: DC

## 2018-05-24 MED ORDER — SACUBITRIL-VALSARTAN 24-26 MG PO TABS: 1.0000 | ORAL_TABLET | Freq: Two times a day (BID) | ORAL | 11 refills | Status: DC

## 2018-05-24 MED ORDER — ATORVASTATIN CALCIUM 10 MG PO TABS: 10.0000 mg | ORAL_TABLET | Freq: Every day | ORAL | 2 refills | Status: DC

## 2018-05-24 MED ORDER — FENOFIBRATE 145 MG PO TABS: 145.0000 mg | ORAL_TABLET | Freq: Every day | ORAL | 3 refills | Status: DC

## 2018-05-24 NOTE — Patient Instructions (Signed)
Medication Instructions:  Your physician has recommended you make the following change in your medication:   1. Stop Losartan 2. Begin Entresto 24/26mg  tablet, two times per day  If you need a refill on your cardiac medications before your next appointment, please call your pharmacy.   Lab work: You will have labs drawn today: Digoxin  Two weeks after starting Entresto, please go to your local [facility] for a BMP. We may increase your dosage of Entresto based of this lab result.  If you have labs (blood work) drawn today and your tests are completely normal, you will receive your results only by: Marland Kitchen MyChart Message (if you have MyChart) OR . A paper copy in the mail If you have any lab test that is abnormal or we need to change your treatment, we will call you to review the results.  Testing/Procedures: None ordered.  Follow-Up: At [facility], you and your health needs are our priority.  As part of our continuing mission to provide you with exceptional heart care, we have created designated Provider Care Teams.  These Care Teams include your primary Cardiologist (physician) and Advanced Practice Providers (APPs -  Physician Assistants and Nurse Practitioners) who all work together to provide you with the care you need, when you need it.  You will need a follow up appointment in 3 months with Dr Caryl Comes  Remote monitoring is used to monitor your ICD from home. This monitoring reduces the number of office visits required to check your device to one time per year. It allows [place] to keep an eye on the functioning of your device to ensure it is working properly. You are scheduled for a device check from home on 06/03/2018. You may send your transmission at any time that day. If you have a wireless device, the transmission will be sent automatically. After your physician reviews your transmission, you will receive a postcard with your next transmission date.   Any Other Special Instructions Will  Be Listed Below (If Applicable).

## 2018-05-24 NOTE — Progress Notes (Signed)
Electrophysiology Office Note   Date:  05/24/2018   ID:  [name], [name] 02/11/1966, MRN [id number]  PCP:  Fayrene Helper, MD  Cardiologist:   Primary Electrophysiologist:    No chief complaint on file.    History of Present Illness: [name] is a 52 y.o. female  Seen in followup for  s/p ICD implanted for primary prevention and 10/14 underwent generator replacement with revision of her 6949-lead.    DATE TEST    10/17 Echo    EF Severe depression      8/18 Echo   EF 20-25 % LAE(43/2.01/42)  11/18 Myoview EF 13% Large scar w/o ischemia    11/18--  episodes of ventricular tachycardia that were sustained below her detection rate. Monitoring function was activated 5/19 VT/VF syncope ( K 2.5 and Mg 1.6) cx by hyperkalemia requiring Rx   She was referred to the heart failure clinic 12/18.  uptitration of meds limited by hypotension    Appropriate Therapy yes ATP 2018 Shock 5/19 Inappropriate Therapy    Antiarrhythmics Date Reason stopped  mexiletine 2018 intolerance  Amiodarone 2018 Allergy swelling     Date Cr K Mg Dig  8/18  0.72 4.1 1.8    11/18  0.72 4.1 2.1   1/19 0.74 3.9    8/19 0.84 4.3 2.3    Denies chest pain or peripheral edema.  Chronic fatigue and mild shortness of breath with activity.     Past Medical History:  Diagnosis Date  . Arthritis of knee   . Automatic implantable cardiac defibrillator in situ    a. s/p prior Medtronic ICD with 6949 lead. b. s/p Gen change & lead revision 05/2013.  . Cardiomyopathy secondary    Patient has cardiomyopathy out of proportion to her ischemic heart disease  . coronary artery disease    RCA stenting Myoview 2011 EF 30% infarction dilatation without ischemia  . Depression   . Diabetes mellitus   . GERD (gastroesophageal reflux disease)   . Insomnia   . Lupus (systemic lupus erythematosus) ([place])   . Migraines   . Myocardial infarction ([place])    8 total   . Nicotine abuse   .  Other and unspecified hyperlipidemia   . Paroxysmal VT ([place])   . Systolic CHF ([place])   . VF (ventricular fibrillation) ([place])    a. Hx appropriate ICD therapy for VF.    Current Outpatient Medications  Medication Sig Dispense Refill  . albuterol (PROVENTIL HFA;VENTOLIN HFA) 108 (90 Base) MCG/ACT inhaler Inhale 2 puffs into the lungs every 6 (six) hours as needed for wheezing or shortness of breath. 1 Inhaler 0  . ALPRAZolam (XANAX) 1 MG tablet Take 1 tablet (1 mg total) by mouth at bedtime. 30 tablet 4  . aspirin 81 MG tablet Take 1 tablet (81 mg total) by mouth daily. 30 tablet 6  . carvedilol (COREG) 12.5 MG tablet Take 1 tablet (12.5 mg total) by mouth 2 (two) times daily. 60 tablet 6  . cholecalciferol (VITAMIN D) 1000 units tablet Take 1,000 Units by mouth daily.    . clopidogrel (PLAVIX) 75 MG tablet Take 1 tablet (75 mg total) by mouth daily. 30 tablet 3  . cyclobenzaprine (FLEXERIL) 5 MG tablet Take 1 tablet (5 mg total) by mouth at bedtime. 30 tablet 4  . digoxin (LANOXIN) 0.125 MG tablet Take 1 tablet (0.125 mg total) by mouth daily. 90 tablet 3  . furosemide (LASIX) 20 MG tablet Take 20 mg  by mouth daily.    . montelukast (SINGULAIR) 10 MG tablet TAKE 1 TABLET BY MOUTH ONCE A DAY AT BEDTIME. 30 tablet 0  . naproxen (NAPROSYN) 500 MG tablet Take 1 tablet (500 mg total) by mouth 2 (two) times daily with a meal. 60 tablet 5  . oxyCODONE (ROXICODONE) 15 MG immediate release tablet Take by mouth.    Marland Kitchen atorvastatin (LIPITOR) 10 MG tablet Take 1 tablet (10 mg total) by mouth daily. 90 tablet 2  . fenofibrate (TRICOR) 145 MG tablet Take 1 tablet (145 mg total) by mouth daily. 90 tablet 3  . losartan (COZAAR) 25 MG tablet Take 1 tablet (25 mg total) by mouth daily. 90 tablet 3   No current facility-administered medications for this visit.     Allergies:   Amiodarone; Bee venom; Mexiletine; Cortisone; and Robaxin [methocarbamol]   Social History:  The patient  reports that she has been  smoking cigarettes. She has a 7.50 pack-year smoking history. She has never used smokeless tobacco. She reports that she drinks alcohol. She reports that she does not use drugs.   Family History:  The patient's family history includes Cancer in her mother; Dementia in her father; Diabetes in her brother, mother, and sister; HIV in her sister; Heart disease in her brother and mother; Hepatitis (age of onset: 53) in her mother; Hyperlipidemia in her brother, mother, and sister; Liver cancer (age of onset: 5) in her mother; Stroke (age of onset: 74) in her sister.    ROS:  Please see the history of present illness.   Positive for ,   All other systems are reviewed and negative.    PHYSICAL EXAM: VS:  BP 110/86   Pulse 83   Ht 5\' 6"  (1.676 m)   Wt 205 lb 6.4 oz (93.2 kg)   SpO2 97%   BMI 33.15 kg/m  , BMI Body mass index is 33.15 kg/m. Well developed and nourished in no acute distress HENT normal Neck supple  Clear Regular rate and rhythm, no murmurs or gallops Abd-soft with active BS No Clubbing cyanosis edema Skin-warm and dry A & Oriented  Grossly normal sensory and motor function    EKG: sinus @ 83 16/13/43  Device interrogation is reviewed today in detail.  See PaceArt for details.  Recent Labs: 12/25/2017: ALT 19; B Natriuretic Peptide 113.0 01/01/2018: Hemoglobin 14.5; Platelets 329 04/13/2018: BUN 5; Creatinine, Ser 0.84; Magnesium 2.3; Potassium 4.3; Sodium 141  06/29/2017: Cholesterol 106; HDL 28; LDL Cholesterol 63; Total CHOL/HDL Ratio 3.8; Triglycerides 75; VLDL 15     CrCl cannot be calculated (Patient's most recent lab result is older than the maximum 21 days allowed.).   Wt Readings from Last 3 Encounters:  05/24/18 205 lb 6.4 oz (93.2 kg)  05/04/18 209 lb (94.8 kg)  04/20/18 212 lb (96.2 kg)         ASSESSMENT AND PLAN:  Ischemic/nonischemic cardiomyopathy  Implantable defibrillator-Medtronic S./P. revision   Ventricular tachycardia  recurrent  Congestive heart failure-chronic-systolic    She is euvolemic.  She has had multiple episodes of nonsustained ventricular tachycardia which for some reason over the last couple of weeks have receded and frequency significantly.  With her blood pressure reasonable and no lightheadedness, I will take the opportunity to try her low-dose Entresto.  We discussed this as an alternative vis--vis spironolactone.  Normally, given the incremental benefit of the latter I would favor its use; however, given the antiarrhythmic effects of Entresto I have suggested that we  try it first.  We will check her surveillance blood work including digoxin level  Euvolemic; we will continue her current diuretics  More than 50% of 45 min was spent in counseling related to the above     Signed, Virl Axe   05/24/2018 3:49 PM      [facility] [address] [place] [address]  [phone] (office) [phone] (fax)

## 2018-05-25 ENCOUNTER — Telehealth (HOSPITAL_COMMUNITY): Payer: Self-pay | Admitting: Family Medicine

## 2018-05-25 ENCOUNTER — Encounter: Payer: Self-pay | Admitting: Orthopaedic Surgery

## 2018-05-25 ENCOUNTER — Ambulatory Visit (INDEPENDENT_AMBULATORY_CARE_PROVIDER_SITE_OTHER): Payer: Medicare Other | Admitting: Orthopaedic Surgery

## 2018-05-25 ENCOUNTER — Ambulatory Visit (HOSPITAL_COMMUNITY): Payer: Medicare Other

## 2018-05-25 VITALS — BP 106/75 | HR 85 | Ht 66.0 in | Wt 204.0 lb

## 2018-05-25 DIAGNOSIS — M25512 Pain in left shoulder: Secondary | ICD-10-CM

## 2018-05-25 DIAGNOSIS — G8929 Other chronic pain: Secondary | ICD-10-CM

## 2018-05-25 DIAGNOSIS — F17210 Nicotine dependence, cigarettes, uncomplicated: Secondary | ICD-10-CM | POA: Diagnosis not present

## 2018-05-25 LAB — DIGOXIN LEVEL: Digoxin, Serum: 1 ng/mL — ABNORMAL HIGH (ref 0.5–0.9)

## 2018-05-25 NOTE — Patient Instructions (Signed)
Steps to Quit Smoking Smoking tobacco can be bad for your health. It can also affect almost every organ in your body. Smoking puts you and people around you at risk for many serious long-lasting (chronic) diseases. Quitting smoking is hard, but it is one of the best things that you can do for your health. It is never too late to quit. What are the benefits of quitting smoking? When you quit smoking, you lower your risk for getting serious diseases and conditions. They can include:  Lung cancer or lung disease.  Heart disease.  Stroke.  Heart attack.  Not being able to have children (infertility).  Weak bones (osteoporosis) and broken bones (fractures).  If you have coughing, wheezing, and shortness of breath, those symptoms may get better when you quit. You may also get sick less often. If you are pregnant, quitting smoking can help to lower your chances of having a baby of low birth weight. What can I do to help me quit smoking? Talk with your doctor about what can help you quit smoking. Some things you can do (strategies) include:  Quitting smoking totally, instead of slowly cutting back how much you smoke over a period of time.  Going to in-person counseling. You are more likely to quit if you go to many counseling sessions.  Using resources and support systems, such as: ? Online chats with a counselor. ? Phone quitlines. ? Printed self-help materials. ? Support groups or group counseling. ? Text messaging programs. ? Mobile phone apps or applications.  Taking medicines. Some of these medicines may have nicotine in them. If you are pregnant or breastfeeding, do not take any medicines to quit smoking unless your doctor says it is okay. Talk with your doctor about counseling or other things that can help you.  Talk with your doctor about using more than one strategy at the same time, such as taking medicines while you are also going to in-person counseling. This can help make  quitting easier. What things can I do to make it easier to quit? Quitting smoking might feel very hard at first, but there is a lot that you can do to make it easier. Take these steps:  Talk to your family and friends. Ask them to support and encourage you.  Call phone quitlines, reach out to support groups, or work with a counselor.  Ask people who smoke to not smoke around you.  Avoid places that make you want (trigger) to smoke, such as: ? Bars. ? Parties. ? Smoke-break areas at work.  Spend time with people who do not smoke.  Lower the stress in your life. Stress can make you want to smoke. Try these things to help your stress: ? Getting regular exercise. ? Deep-breathing exercises. ? Yoga. ? Meditating. ? Doing a body scan. To do this, close your eyes, focus on one area of your body at a time from head to toe, and notice which parts of your body are tense. Try to relax the muscles in those areas.  Download or buy apps on your mobile phone or tablet that can help you stick to your quit plan. There are many free apps, such as QuitGuide from the CDC (Centers for Disease Control and Prevention). You can find more support from smokefree.gov and other websites.  This information is not intended to replace advice given to you by your health care provider. Make sure you discuss any questions you have with your health care provider. Document Released: 05/31/2009 Document   Revised: 04/01/2016 Document Reviewed: 12/19/2014 Elsevier Interactive Patient Education  2018 Elsevier Inc.  

## 2018-05-25 NOTE — Telephone Encounter (Signed)
05/25/18  pt is sick with a cold, cough, fever

## 2018-05-25 NOTE — Progress Notes (Signed)
Patient [name], female DOB:Apr 16, 1966, 52 y.o. QHU:[id number]  Chief Complaint  Patient presents with  . Shoulder Pain    left    HPI  [name] is a 52 y.o. female who has left shoulder pain.  She has been going to OT and is making good progress. She has better motion and less pain but still has a ways to go.  She has no new trauma, no numbness.   Body mass index is 32.93 kg/m.  ROS  Review of Systems  Constitutional: Positive for activity change.  Musculoskeletal: Positive for arthralgias and myalgias.  All other systems reviewed and are negative.   All other systems reviewed and are negative.  The following is a summary of the past history medically, past history surgically, known current medicines, social history and family history.  This information is gathered electronically by the computer from prior information and documentation.  I review this each visit and have found including this information at this point in the chart is beneficial and informative.    Past Medical History:  Diagnosis Date  . Arthritis of knee   . Automatic implantable cardiac defibrillator in situ    a. s/p prior Medtronic ICD with 6949 lead. b. s/p Gen change & lead revision 05/2013.  . Cardiomyopathy secondary    Patient has cardiomyopathy out of proportion to her ischemic heart disease  . coronary artery disease    RCA stenting Myoview 2011 EF 30% infarction dilatation without ischemia  . Depression   . Diabetes mellitus   . GERD (gastroesophageal reflux disease)   . Insomnia   . Lupus (systemic lupus erythematosus) ([place])   . Migraines   . Myocardial infarction ([place])    8 total   . Nicotine abuse   . Other and unspecified hyperlipidemia   . Paroxysmal VT ([place])   . Systolic CHF ([place])   . VF (ventricular fibrillation) ([place])    a. Hx appropriate ICD therapy for VF.    Past Surgical History:  Procedure Laterality Date  . ABDOMINAL HYSTERECTOMY    .  CHOLECYSTECTOMY    . COLONOSCOPY  07/15/2011   SLF: internal hemorrhoids/hyperplastic polyps in the rectum/tubular adenomaSURVEILLANCE Nov 2017  . defibrillator placed   2009 and 05/2013  . ICD GENERATOR CHANGE  06/17/2013   Dr Caryl Comes  . IMPLANTABLE CARDIOVERTER DEFIBRILLATOR (ICD) GENERATOR CHANGE Left 06/17/2013   Procedure: ICD GENERATOR CHANGE;  Surgeon: Deboraha Sprang, MD;  Location: [facility] CATH LAB;  Service: Cardiovascular;  Laterality: Left;  . LEAD REVISION N/A 06/17/2013   Procedure: LEAD REVISION;  Surgeon: Deboraha Sprang, MD;  Location[facility] CATH LAB;  Service: Cardiovascular;  Laterality: N/A;  . ROTATOR CUFF REPAIR Right 2002    Family History  Problem Relation Age of Onset  . Hepatitis Mother 61       HCV  . Liver cancer Mother 35  . Cancer Mother   . Diabetes Mother   . Heart disease Mother   . Hyperlipidemia Mother   . Stroke Sister 45       x3  . Diabetes Sister   . Hyperlipidemia Sister   . Dementia Father   . HIV Sister   . Diabetes Brother   . Heart disease Brother   . Hyperlipidemia Brother     Social History Social History   Tobacco Use  . Smoking status: Current Some Day Smoker    Packs/day: 0.50    Years: 15.00    Pack years: 7.50  Types: Cigarettes  . Smokeless tobacco: Never Used  Substance Use Topics  . Alcohol use: Yes    Alcohol/week: 0.0 standard drinks    Comment: occasionaly  . Drug use: No    Allergies  Allergen Reactions  . Amiodarone     Headache and facial swelling  . Bee Venom Anaphylaxis  . Mexiletine   . Cortisone Swelling  . Robaxin [Methocarbamol] Hives and Itching    Current Outpatient Medications  Medication Sig Dispense Refill  . albuterol (PROVENTIL HFA;VENTOLIN HFA) 108 (90 Base) MCG/ACT inhaler Inhale 2 puffs into the lungs every 6 (six) hours as needed for wheezing or shortness of breath. 1 Inhaler 0  . ALPRAZolam (XANAX) 1 MG tablet Take 1 tablet (1 mg total) by mouth at bedtime. 30 tablet 4  . aspirin 81 MG  tablet Take 1 tablet (81 mg total) by mouth daily. 30 tablet 6  . atorvastatin (LIPITOR) 10 MG tablet Take 1 tablet (10 mg total) by mouth daily. 90 tablet 2  . carvedilol (COREG) 12.5 MG tablet Take 1 tablet (12.5 mg total) by mouth 2 (two) times daily. 60 tablet 6  . cholecalciferol (VITAMIN D) 1000 units tablet Take 1,000 Units by mouth daily.    . clopidogrel (PLAVIX) 75 MG tablet Take 1 tablet (75 mg total) by mouth daily. 30 tablet 3  . cyclobenzaprine (FLEXERIL) 5 MG tablet Take 1 tablet (5 mg total) by mouth at bedtime. 30 tablet 4  . digoxin (LANOXIN) 0.125 MG tablet Take 1 tablet (0.125 mg total) by mouth daily. 90 tablet 3  . fenofibrate (TRICOR) 145 MG tablet Take 1 tablet (145 mg total) by mouth daily. 90 tablet 3  . furosemide (LASIX) 20 MG tablet Take 20 mg by mouth daily.    . montelukast (SINGULAIR) 10 MG tablet TAKE 1 TABLET BY MOUTH ONCE A DAY AT BEDTIME. 30 tablet 0  . naproxen (NAPROSYN) 500 MG tablet Take 1 tablet (500 mg total) by mouth 2 (two) times daily with a meal. 60 tablet 5  . oxyCODONE (ROXICODONE) 15 MG immediate release tablet Take by mouth.    . sacubitril-valsartan (ENTRESTO) 24-26 MG Take 1 tablet by mouth 2 (two) times daily. 60 tablet 11   No current facility-administered medications for this visit.      Physical Exam  Blood pressure 106/75, pulse 85, height 5\' 6"  (1.676 m), weight 204 lb (92.5 kg).  Constitutional: overall normal hygiene, normal nutrition, well developed, normal grooming, normal body habitus. Assistive device:none  Musculoskeletal: gait and station Limp none, muscle tone and strength are normal, no tremors or atrophy is present.  .  Neurological: coordination overall normal.  Deep tendon reflex/nerve stretch intact.  Sensation normal.  Cranial nerves II-XII intact.   Skin:   Normal overall no scars, lesions, ulcers or rashes. No psoriasis.  Psychiatric: Alert and oriented x 3.  Recent memory intact, remote memory unclear.  Normal  mood and affect. Well groomed.  Good eye contact.  Cardiovascular: overall no swelling, no varicosities, no edema bilaterally, normal temperatures of the legs and arms, no clubbing, cyanosis and good capillary refill.  Lymphatic: palpation is normal.  Examination of left Upper Extremity is done.  Inspection:   Overall:  Elbow non-tender without crepitus or defects, forearm non-tender without crepitus or defects, wrist non-tender without crepitus or defects, hand non-tender.    Shoulder: with glenohumeral joint tenderness, without effusion.   Upper arm: without swelling and tenderness   Range of motion:   Overall:  Full  range of motion of the elbow, full range of motion of wrist and full range of motion in fingers.   Shoulder:  left  140 degrees forward flexion; 100 degrees abduction; 30 degrees internal rotation, 30 degrees external rotation, 10 degrees extension, 35 degrees adduction.   Stability:   Overall:  Shoulder, elbow and wrist stable   Strength and Tone:   Overall full shoulder muscles strength, full upper arm strength and normal upper arm bulk and tone.  All other systems reviewed and are negative   The patient has been educated about the nature of the problem(s) and counseled on treatment options.  The patient appeared to understand what I have discussed and is in agreement with it.  Encounter Diagnoses  Name Primary?  . Chronic left shoulder pain Yes  . Cigarette nicotine dependence without complication     PLAN Call if any problems.  Precautions discussed.  Continue current medications.   Return to clinic 3 weeks   Continue OT.  Electronically Signed Sanjuana Kava, MD 10/8/20193:02 PM

## 2018-05-27 ENCOUNTER — Telehealth: Payer: Self-pay

## 2018-05-27 MED ORDER — DIGOXIN 125 MCG PO TABS: 0.1250 mg | ORAL_TABLET | ORAL | 3 refills | Status: DC

## 2018-05-27 NOTE — Telephone Encounter (Signed)
-----   Message from Deboraha Sprang, MD sent at 05/26/2018  8:37 AM EDT ----- Please inform patient that drug level ( Dig is too high ) Decrease dose to eavery other day thnks

## 2018-05-27 NOTE — Telephone Encounter (Signed)
Pt aware of digoxin level. Will decrease to qod. No additional questions.

## 2018-06-01 ENCOUNTER — Telehealth (HOSPITAL_COMMUNITY): Payer: Self-pay

## 2018-06-01 ENCOUNTER — Ambulatory Visit (HOSPITAL_COMMUNITY): Payer: Medicare Other

## 2018-06-01 NOTE — Telephone Encounter (Signed)
She came in with sore throat and fever, decided to cx for today. NF 06/01/18

## 2018-06-02 ENCOUNTER — Ambulatory Visit (INDEPENDENT_AMBULATORY_CARE_PROVIDER_SITE_OTHER): Payer: Medicare Other | Admitting: Family Medicine

## 2018-06-02 ENCOUNTER — Ambulatory Visit (HOSPITAL_COMMUNITY)
Admission: RE | Admit: 2018-06-02 | Discharge: 2018-06-02 | Disposition: A | Discharge status: Home / Self Care | Payer: Medicare Other | Source: Ambulatory Visit | Attending: Family Medicine | Admitting: Family Medicine

## 2018-06-02 ENCOUNTER — Encounter: Payer: Self-pay | Admitting: Family Medicine

## 2018-06-02 VITALS — BP 110/70 | HR 68 | Resp 12 | Ht 66.0 in | Wt 206.0 lb

## 2018-06-02 DIAGNOSIS — J209 Acute bronchitis, unspecified: Secondary | ICD-10-CM

## 2018-06-02 DIAGNOSIS — J019 Acute sinusitis, unspecified: Secondary | ICD-10-CM

## 2018-06-02 DIAGNOSIS — I10 Essential (primary) hypertension: Secondary | ICD-10-CM

## 2018-06-02 DIAGNOSIS — J440 Chronic obstructive pulmonary disease with acute lower respiratory infection: Secondary | ICD-10-CM | POA: Insufficient documentation

## 2018-06-02 DIAGNOSIS — I700 Atherosclerosis of aorta: Secondary | ICD-10-CM | POA: Diagnosis not present

## 2018-06-02 DIAGNOSIS — R05 Cough: Secondary | ICD-10-CM | POA: Diagnosis present

## 2018-06-02 DIAGNOSIS — J984 Other disorders of lung: Secondary | ICD-10-CM | POA: Diagnosis not present

## 2018-06-02 LAB — COMPLETE METABOLIC PANEL WITH GFR
AG RATIO: 1.1 (calc) (ref 1.0–2.5)
ALT: 13 U/L (ref 6–29)
AST: 23 U/L (ref 10–35)
Albumin: 3.5 g/dL — ABNORMAL LOW (ref 3.6–5.1)
Alkaline phosphatase (APISO): 67 U/L (ref 33–130)
BILIRUBIN TOTAL: 0.8 mg/dL (ref 0.2–1.2)
BUN / CREAT RATIO: 6 (calc) (ref 6–22)
BUN: 5 mg/dL — AB (ref 7–25)
CALCIUM: 8.2 mg/dL — AB (ref 8.6–10.4)
CHLORIDE: 103 mmol/L (ref 98–110)
CO2: 31 mmol/L (ref 20–32)
Creat: 0.79 mg/dL (ref 0.50–1.05)
GFR, Est African American: 98 mL/min/{1.73_m2} (ref >=60)
GFR, Est Non African American: 85 mL/min/{1.73_m2} (ref >=60)
GLUCOSE: 183 mg/dL — AB (ref 65–99)
Globulin: 3.2 g/dL (calc) (ref 1.9–3.7)
POTASSIUM: 3 mmol/L — AB (ref 3.5–5.3)
SODIUM: 139 mmol/L (ref 135–146)
TOTAL PROTEIN: 6.7 g/dL (ref 6.1–8.1)

## 2018-06-02 LAB — CBC WITH DIFFERENTIAL/PLATELET
BASOS PCT: 1.1 %
Basophils Absolute: 30 cells/uL (ref 0–200)
Eosinophils Absolute: 19 cells/uL (ref 15–500)
Eosinophils Relative: 0.7 %
HCT: 39.2 % (ref 35.0–45.0)
Hemoglobin: 13.2 g/dL (ref 11.7–15.5)
Lymphs Abs: 1607 cells/uL (ref 850–3900)
MCH: 26.6 pg — ABNORMAL LOW (ref 27.0–33.0)
MCHC: 33.7 g/dL (ref 32.0–36.0)
MCV: 78.9 fL — AB (ref 80.0–100.0)
MONOS PCT: 7.1 %
MPV: 11.3 fL (ref 7.5–12.5)
NEUTROS PCT: 31.6 %
Neutro Abs: 853 cells/uL — ABNORMAL LOW (ref 1500–7800)
PLATELETS: 226 10*3/uL (ref 140–400)
RBC: 4.97 10*6/uL (ref 3.80–5.10)
RDW: 12.7 % (ref 11.0–15.0)
TOTAL LYMPHOCYTE: 59.5 %
WBC mixed population: 192 cells/uL — ABNORMAL LOW (ref 200–950)
WBC: 2.7 10*3/uL — AB (ref 3.8–10.8)

## 2018-06-02 MED ORDER — PROMETHAZINE-DM 6.25-15 MG/5ML PO SYRP: ORAL_SOLUTION | ORAL | 0 refills | Status: DC

## 2018-06-02 MED ORDER — FLUCONAZOLE 150 MG PO TABS: ORAL_TABLET | ORAL | 0 refills | Status: DC

## 2018-06-02 MED ORDER — BENZONATATE 100 MG PO CAPS: 100.0000 mg | ORAL_CAPSULE | Freq: Three times a day (TID) | ORAL | 0 refills | Status: DC | PRN

## 2018-06-02 MED ORDER — PENICILLIN V POTASSIUM 500 MG PO TABS: 500.0000 mg | ORAL_TABLET | Freq: Three times a day (TID) | ORAL | 0 refills | Status: DC

## 2018-06-02 MED ORDER — CEFTRIAXONE SODIUM 500 MG IJ SOLR
500.0000 mg | Freq: Once | INTRAMUSCULAR | Status: AC
  Administered 2018-06-02: 500 mg via INTRAMUSCULAR

## 2018-06-02 NOTE — Assessment & Plan Note (Addendum)
Antibiotic course and X ray of sinuses also labs ordered

## 2018-06-02 NOTE — Assessment & Plan Note (Addendum)
2 week h/o worsening symptoms, coughing green sputum and chills. Rocephin 500 mg iM, cXR, decongestants , PCN and phenergan dM

## 2018-06-02 NOTE — Patient Instructions (Signed)
F/U as before , call if you need me sooner  You are treated fort acute sinusitis and bronchitis  Rocephin given in office and 4 medications are prescribed  Please get labs as soon as you leave , and then got to the hospital for X ray of chest and sinuses  I trust that you will improve, if not, please call

## 2018-06-03 ENCOUNTER — Ambulatory Visit (INDEPENDENT_AMBULATORY_CARE_PROVIDER_SITE_OTHER): Payer: Medicare Other

## 2018-06-03 DIAGNOSIS — I5022 Chronic systolic (congestive) heart failure: Secondary | ICD-10-CM

## 2018-06-03 DIAGNOSIS — T827XXA Infection and inflammatory reaction due to other cardiac and vascular devices, implants and grafts, initial encounter: Secondary | ICD-10-CM

## 2018-06-03 NOTE — Progress Notes (Signed)
EPIC Encounter for ICM Monitoring  Patient Name: [name] is a 52 y.o. female Date: 06/03/2018 Primary Care Physican: Fayrene Helper, MD Primary Cardiologist: Klein/Bensimhon Electrophysiologist: Faustino Congress Weight: 206 lbs  Since 24-May-2018:  VT-NS (>4 beats, >150 bpm)    2 episodes      Heart Failure questions reviewed, pt has bronchitis. It started as a cold 3 weeks ago and developed into bronchitis.  Dr Moshe Cipro (PCP) prescribed antibiotic and Potassium since it was 3.0 on 10/16.      Thoracic impedance abnormal suggesting fluid accumulation starting 05/23/2018.   Prescribed: Furosemide 20 mg 1 tablet daily   Labs: 06/02/2018 Creatinine 0.79, BUN 5,   Potassium 3.0, Sodium 139, eGFR 85-98 (low potassium has been addressed by Dr Moshe Cipro) 04/13/2018 Creatinine 0.84, BUN 5, Potassium 4.3, Sodium 141, EGFR >60 02/08/2018 Creatinine 0.69, BUN 8, Potassium 3.7, Sodium 139, EGFR 100-116 01/26/2018 Creatinine 0.85, BUN 7, Potassium 3.2, Sodium 137, EGFR >60 01/05/2018 Creatinine0.99, BUN8, Potassium4.6, Sodium136, EGFR>60, Magnesium 2.0 01/02/2018 Creatinine1.50, BUN26, Potassium4.2, Sodium133, [id number] at 8:28 AM  01/02/2018 Creatinine1.74, BUN29, Potassium6.2, Sodium133, [id number] at 4:34 AM  01/02/2018 Creatinine1.93, BUN29, Potassium5.0, Sodium135, [id number] at 1:06 AM  01/01/2018 Creatinine2.59, EAD07, Potassium>7.6 and dropped to 5.8 at (11:11 PM), [id number], [id number] at 7:23 PM, Magnesium 2.9 01/01/2018 Creatinine2.07, BUN31, Potassium6.5, [id number], [id number] at 4:13 PM, Magnesium 2.7 12/25/2017 Creatinine0.77, BUN9, Potassium 3.7, Sodium137, EGFR>60 at 6:35 AM  12/25/2017 Creatinine0.84, BUN8, Potassium 2.5, Sodium138, EGFR>60 at 12:17 AM, Magnesium 1.6 09/09/2017 Creatinine 0.74, BUN 7, Potassium 3.9, Sodium 137, EGFR >60  Recommendations:  Advised if bronchitis symptoms worsen to use ER if needed or is  she has fluid accumulation symptoms.  Follow-up plan: ICM clinic phone appointment on 06/24/2018 to recheck fluid levels.   Office appointment scheduled 06/07/2018 with Dr. Haroldine Laws.    Copy of ICM check sent to Dr. Caryl Comes and Dr Haroldine Laws for review and will call back if any recommendations.   3 month ICM trend: 06/03/2018    1 Year ICM trend:       [name], RN 06/03/2018 11:46 AM

## 2018-06-04 ENCOUNTER — Telehealth: Payer: Self-pay

## 2018-06-04 DIAGNOSIS — E876 Hypokalemia: Secondary | ICD-10-CM

## 2018-06-04 DIAGNOSIS — D729 Disorder of white blood cells, unspecified: Secondary | ICD-10-CM

## 2018-06-04 NOTE — Telephone Encounter (Signed)
Labs ordered and mailed to patient.  

## 2018-06-06 ENCOUNTER — Encounter: Payer: Self-pay | Admitting: Family Medicine

## 2018-06-06 NOTE — Progress Notes (Signed)
   [name]     MRN: [id number]      DOB: Jan 26, 1966   HPI [name] is here  2 week h/o worsening head and chest congestion, associated with fever and chills intermittently. Nasal drainage has thickened , and is yellowish green, and at times bloody. Sputum is thick and yellow. C/o bilateral ear pressure, denies hearing loss and sore throat. Increasing fatigue , poor appetitie and sleep disturbed by cough. No improvement with OTC medication.  ROS Denies chest pains, palpitations and leg swelling Denies abdominal pain, nausea, vomiting,diarrhea or constipation.   Denies dysuria, frequency, hesitancy or incontinence. Denies joint pain, swelling and limitation in mobility. Denies headaches, seizures, numbness, or tingling. Denies depression, anxiety or insomnia. Denies skin break down or rash.   PE  BP 110/70 (BP Location: Left Arm, Patient Position: Sitting, Cuff Size: Large)   Pulse 68   Resp 12   Ht 5\' 6"  (1.676 m)   Wt 206 lb 0.6 oz (93.5 kg)   SpO2 95% Comment: room air  BMI 33.26 kg/m   Patient alert and oriented and ill appearing HEENT: No facial asymmetry, EOMI,   oropharynx pink and moist.  Neck supple no JVD,  Bilateral cervical adenitis.Maxillary and frontal sinus tenderness  Chest: decreased air entry , bilateral crackles, no wheezes  CVS: S1, S2 no murmurs, no S3.Regular rate.  ABD: Soft non tender.   Ext: No edema  MS: Adequate ROM spine, shoulders, hips and knees.  Skin: Intact, no ulcerations or rash noted.  Psych: Good eye contact, normal affect. Memory intact not anxious or depressed appearing.  CNS: CN 2-12 intact, power,  normal throughout.no focal deficits noted.   Assessment & Plan  Acute bronchitis with COPD ([place]) 2 week h/o worsening symptoms, coughing green sputum and chills. Rocephin 500 mg iM, cXR, decongestants , PCN and phenergan dM  Acute sinusitis Antibiotic course and X ray of sinuses also labs ordered  HTN,  goal below 130/80 Controlled, no change in medication

## 2018-06-06 NOTE — Assessment & Plan Note (Signed)
Controlled, no change in medication  

## 2018-06-07 ENCOUNTER — Encounter (HOSPITAL_COMMUNITY): Payer: Medicare Other | Admitting: Internal Medicine

## 2018-06-08 ENCOUNTER — Telehealth (HOSPITAL_COMMUNITY): Payer: Self-pay

## 2018-06-08 ENCOUNTER — Ambulatory Visit (HOSPITAL_COMMUNITY): Payer: Medicare Other

## 2018-06-08 NOTE — Telephone Encounter (Signed)
L/m and sent email to cx apptment .NF 06/08/18 10:43am

## 2018-06-09 ENCOUNTER — Other Ambulatory Visit: Payer: Self-pay | Admitting: Student

## 2018-06-10 DIAGNOSIS — M329 Systemic lupus erythematosus, unspecified: Secondary | ICD-10-CM | POA: Diagnosis not present

## 2018-06-10 DIAGNOSIS — G894 Chronic pain syndrome: Secondary | ICD-10-CM | POA: Diagnosis not present

## 2018-06-14 ENCOUNTER — Encounter: Payer: Medicare Other | Admitting: Family Medicine

## 2018-06-14 DIAGNOSIS — D729 Disorder of white blood cells, unspecified: Secondary | ICD-10-CM | POA: Diagnosis not present

## 2018-06-14 DIAGNOSIS — E876 Hypokalemia: Secondary | ICD-10-CM | POA: Diagnosis not present

## 2018-06-14 LAB — BASIC METABOLIC PANEL WITH GFR
BUN: 7 mg/dL (ref 7–25)
CALCIUM: 8.7 mg/dL (ref 8.6–10.4)
CO2: 30 mmol/L (ref 20–32)
CREATININE: 0.78 mg/dL (ref 0.50–1.05)
Chloride: 108 mmol/L (ref 98–110)
GFR, EST AFRICAN AMERICAN: 100 mL/min/{1.73_m2} (ref >=60)
GFR, EST NON AFRICAN AMERICAN: 86 mL/min/{1.73_m2} (ref >=60)
Glucose, Bld: 163 mg/dL — ABNORMAL HIGH (ref 65–139)
Potassium: 3.9 mmol/L (ref 3.5–5.3)
Sodium: 142 mmol/L (ref 135–146)

## 2018-06-14 LAB — CBC WITH DIFFERENTIAL/PLATELET
BASOS PCT: 1 %
Basophils Absolute: 58 cells/uL (ref 0–200)
Eosinophils Absolute: 52 cells/uL (ref 15–500)
Eosinophils Relative: 0.9 %
HEMATOCRIT: 37.5 % (ref 35.0–45.0)
Hemoglobin: 12.7 g/dL (ref 11.7–15.5)
LYMPHS ABS: 3294 {cells}/uL (ref 850–3900)
MCH: 26.9 pg — ABNORMAL LOW (ref 27.0–33.0)
MCHC: 33.9 g/dL (ref 32.0–36.0)
MCV: 79.4 fL — AB (ref 80.0–100.0)
MPV: 11.3 fL (ref 7.5–12.5)
Monocytes Relative: 5 %
NEUTROS PCT: 36.3 %
Neutro Abs: 2105 cells/uL (ref 1500–7800)
Platelets: 283 10*3/uL (ref 140–400)
RBC: 4.72 10*6/uL (ref 3.80–5.10)
RDW: 12.5 % (ref 11.0–15.0)
Total Lymphocyte: 56.8 %
WBC: 5.8 10*3/uL (ref 3.8–10.8)
WBCMIX: 290 {cells}/uL (ref 200–950)

## 2018-06-15 ENCOUNTER — Ambulatory Visit: Payer: Medicare Other | Admitting: Orthopaedic Surgery

## 2018-06-15 ENCOUNTER — Encounter: Payer: Self-pay | Admitting: Orthopaedic Surgery

## 2018-06-16 ENCOUNTER — Encounter (HOSPITAL_COMMUNITY): Payer: Self-pay | Admitting: Occupational Therapy

## 2018-06-16 NOTE — Therapy (Signed)
[facility] [place], [place], [address] Phone: [phone]   Fax:  [phone]  Patient Details  Name: [name] MRN: [id number] Date of Birth: 11/05/1965 Referring Provider:  No ref. provider found  Encounter Date: 06/16/2018   OCCUPATIONAL THERAPY DISCHARGE SUMMARY  Visits from Start of Care: 7  Current functional level related to goals / functional outcomes: Unknown. Pt has not returned since appt on 05/11/18. Cancelled all remaining appts and has not returned to therapy. Pt will need a new order if she wishes to return to therapy.    Remaining deficits: Continued pain in LUE, ROM, strength, and functional deficits.    Education / Equipment: HEP for ROM  Plan: Patient agrees to discharge.  Patient goals were not met. Patient is being discharged due to not returning since the last visit.  ?????       Guadelupe Sabin, OTR/L  [phone] 06/16/2018, 10:36 AM  [facility] [address] [place], [place], [address] Phone: [phone]   Fax:  [phone]

## 2018-06-22 ENCOUNTER — Other Ambulatory Visit: Payer: Self-pay

## 2018-06-22 ENCOUNTER — Emergency Department (HOSPITAL_COMMUNITY): Payer: Medicare Other

## 2018-06-22 ENCOUNTER — Ambulatory Visit (INDEPENDENT_AMBULATORY_CARE_PROVIDER_SITE_OTHER): Payer: Medicare Other | Admitting: Family Medicine

## 2018-06-22 ENCOUNTER — Encounter (HOSPITAL_COMMUNITY): Payer: Self-pay | Admitting: Emergency Medicine

## 2018-06-22 ENCOUNTER — Inpatient Hospital Stay (HOSPITAL_COMMUNITY)
Admission: EM | Admit: 2018-06-22 | Discharge: 2018-06-30 | DRG: 280 | Disposition: A | Discharge status: Home / Self Care | Payer: Medicare Other | Attending: Internal Medicine | Admitting: Internal Medicine

## 2018-06-22 ENCOUNTER — Encounter: Payer: Self-pay | Admitting: Family Medicine

## 2018-06-22 ENCOUNTER — Telehealth: Payer: Self-pay

## 2018-06-22 ENCOUNTER — Telehealth: Payer: Self-pay | Admitting: Internal Medicine

## 2018-06-22 VITALS — BP 80/50 | HR 54 | Temp 97.6°F | Resp 12 | Ht 66.0 in | Wt 207.1 lb

## 2018-06-22 DIAGNOSIS — Z9861 Coronary angioplasty status: Secondary | ICD-10-CM | POA: Diagnosis not present

## 2018-06-22 DIAGNOSIS — M329 Systemic lupus erythematosus, unspecified: Secondary | ICD-10-CM | POA: Diagnosis not present

## 2018-06-22 DIAGNOSIS — I214 Non-ST elevation (NSTEMI) myocardial infarction: Principal | ICD-10-CM | POA: Diagnosis present

## 2018-06-22 DIAGNOSIS — J449 Chronic obstructive pulmonary disease, unspecified: Secondary | ICD-10-CM | POA: Diagnosis present

## 2018-06-22 DIAGNOSIS — I255 Ischemic cardiomyopathy: Secondary | ICD-10-CM | POA: Diagnosis not present

## 2018-06-22 DIAGNOSIS — I252 Old myocardial infarction: Secondary | ICD-10-CM | POA: Diagnosis not present

## 2018-06-22 DIAGNOSIS — I472 Ventricular tachycardia: Secondary | ICD-10-CM | POA: Diagnosis not present

## 2018-06-22 DIAGNOSIS — Z91030 Bee allergy status: Secondary | ICD-10-CM

## 2018-06-22 DIAGNOSIS — Z01810 Encounter for preprocedural cardiovascular examination: Secondary | ICD-10-CM | POA: Diagnosis not present

## 2018-06-22 DIAGNOSIS — Z7902 Long term (current) use of antithrombotics/antiplatelets: Secondary | ICD-10-CM

## 2018-06-22 DIAGNOSIS — E669 Obesity, unspecified: Secondary | ICD-10-CM | POA: Diagnosis not present

## 2018-06-22 DIAGNOSIS — E876 Hypokalemia: Secondary | ICD-10-CM | POA: Diagnosis not present

## 2018-06-22 DIAGNOSIS — I952 Hypotension due to drugs: Secondary | ICD-10-CM | POA: Diagnosis not present

## 2018-06-22 DIAGNOSIS — R160 Hepatomegaly, not elsewhere classified: Secondary | ICD-10-CM | POA: Diagnosis not present

## 2018-06-22 DIAGNOSIS — E785 Hyperlipidemia, unspecified: Secondary | ICD-10-CM | POA: Diagnosis present

## 2018-06-22 DIAGNOSIS — J069 Acute upper respiratory infection, unspecified: Secondary | ICD-10-CM | POA: Diagnosis present

## 2018-06-22 DIAGNOSIS — E119 Type 2 diabetes mellitus without complications: Secondary | ICD-10-CM | POA: Diagnosis present

## 2018-06-22 DIAGNOSIS — R001 Bradycardia, unspecified: Secondary | ICD-10-CM | POA: Diagnosis not present

## 2018-06-22 DIAGNOSIS — E118 Type 2 diabetes mellitus with unspecified complications: Secondary | ICD-10-CM

## 2018-06-22 DIAGNOSIS — Z95810 Presence of automatic (implantable) cardiac defibrillator: Secondary | ICD-10-CM | POA: Diagnosis not present

## 2018-06-22 DIAGNOSIS — Z01818 Encounter for other preprocedural examination: Secondary | ICD-10-CM

## 2018-06-22 DIAGNOSIS — I5022 Chronic systolic (congestive) heart failure: Secondary | ICD-10-CM | POA: Diagnosis not present

## 2018-06-22 DIAGNOSIS — I5023 Acute on chronic systolic (congestive) heart failure: Secondary | ICD-10-CM | POA: Diagnosis present

## 2018-06-22 DIAGNOSIS — E875 Hyperkalemia: Secondary | ICD-10-CM | POA: Diagnosis present

## 2018-06-22 DIAGNOSIS — J40 Bronchitis, not specified as acute or chronic: Secondary | ICD-10-CM | POA: Diagnosis not present

## 2018-06-22 DIAGNOSIS — I2510 Atherosclerotic heart disease of native coronary artery without angina pectoris: Secondary | ICD-10-CM | POA: Diagnosis present

## 2018-06-22 DIAGNOSIS — I493 Ventricular premature depolarization: Secondary | ICD-10-CM

## 2018-06-22 DIAGNOSIS — I428 Other cardiomyopathies: Secondary | ICD-10-CM | POA: Diagnosis present

## 2018-06-22 DIAGNOSIS — I959 Hypotension, unspecified: Secondary | ICD-10-CM

## 2018-06-22 DIAGNOSIS — Z79899 Other long term (current) drug therapy: Secondary | ICD-10-CM

## 2018-06-22 DIAGNOSIS — E43 Unspecified severe protein-calorie malnutrition: Secondary | ICD-10-CM | POA: Diagnosis not present

## 2018-06-22 DIAGNOSIS — M1710 Unilateral primary osteoarthritis, unspecified knee: Secondary | ICD-10-CM | POA: Diagnosis not present

## 2018-06-22 DIAGNOSIS — J019 Acute sinusitis, unspecified: Secondary | ICD-10-CM | POA: Diagnosis not present

## 2018-06-22 DIAGNOSIS — R079 Chest pain, unspecified: Secondary | ICD-10-CM | POA: Diagnosis not present

## 2018-06-22 DIAGNOSIS — Z6833 Body mass index (BMI) 33.0-33.9, adult: Secondary | ICD-10-CM | POA: Diagnosis not present

## 2018-06-22 DIAGNOSIS — T82855A Stenosis of coronary artery stent, initial encounter: Secondary | ICD-10-CM | POA: Diagnosis not present

## 2018-06-22 DIAGNOSIS — Z955 Presence of coronary angioplasty implant and graft: Secondary | ICD-10-CM

## 2018-06-22 DIAGNOSIS — Z720 Tobacco use: Secondary | ICD-10-CM | POA: Diagnosis not present

## 2018-06-22 DIAGNOSIS — R1310 Dysphagia, unspecified: Secondary | ICD-10-CM

## 2018-06-22 DIAGNOSIS — F17210 Nicotine dependence, cigarettes, uncomplicated: Secondary | ICD-10-CM | POA: Diagnosis not present

## 2018-06-22 DIAGNOSIS — Z888 Allergy status to other drugs, medicaments and biological substances status: Secondary | ICD-10-CM

## 2018-06-22 DIAGNOSIS — R05 Cough: Secondary | ICD-10-CM | POA: Diagnosis not present

## 2018-06-22 DIAGNOSIS — I5043 Acute on chronic combined systolic (congestive) and diastolic (congestive) heart failure: Secondary | ICD-10-CM | POA: Diagnosis not present

## 2018-06-22 DIAGNOSIS — I25110 Atherosclerotic heart disease of native coronary artery with unstable angina pectoris: Secondary | ICD-10-CM | POA: Diagnosis not present

## 2018-06-22 DIAGNOSIS — Z8249 Family history of ischemic heart disease and other diseases of the circulatory system: Secondary | ICD-10-CM

## 2018-06-22 DIAGNOSIS — Z7982 Long term (current) use of aspirin: Secondary | ICD-10-CM

## 2018-06-22 DIAGNOSIS — R008 Other abnormalities of heart beat: Secondary | ICD-10-CM | POA: Diagnosis not present

## 2018-06-22 HISTORY — DX: Unspecified nephritic syndrome with diffuse membranous glomerulonephritis: N05.2

## 2018-06-22 HISTORY — DX: Other chronic pain: G89.29

## 2018-06-22 LAB — CBC WITH DIFFERENTIAL/PLATELET
ABS IMMATURE GRANULOCYTES: 0.01 10*3/uL (ref 0.00–0.07)
BASOS PCT: 1 %
Basophils Absolute: 0 10*3/uL (ref 0.0–0.1)
EOS PCT: 1 %
Eosinophils Absolute: 0.1 10*3/uL (ref 0.0–0.5)
HCT: 40.4 % (ref 36.0–46.0)
HEMOGLOBIN: 12.6 g/dL (ref 12.0–15.0)
Immature Granulocytes: 0 %
Lymphocytes Relative: 47 %
Lymphs Abs: 2.6 10*3/uL (ref 0.7–4.0)
MCH: 26.3 pg (ref 26.0–34.0)
MCHC: 31.2 g/dL (ref 30.0–36.0)
MCV: 84.3 fL (ref 80.0–100.0)
MONO ABS: 0.4 10*3/uL (ref 0.1–1.0)
Monocytes Relative: 7 %
NEUTROS ABS: 2.4 10*3/uL (ref 1.7–7.7)
Neutrophils Relative %: 44 %
PLATELETS: 264 10*3/uL (ref 150–400)
RBC: 4.79 MIL/uL (ref 3.87–5.11)
RDW: 13.6 % (ref 11.5–15.5)
WBC: 5.4 10*3/uL (ref 4.0–10.5)
nRBC: 0 % (ref 0.0–0.2)

## 2018-06-22 LAB — TROPONIN I

## 2018-06-22 LAB — PROTIME-INR
INR: 1.03
PROTHROMBIN TIME: 13.4 s (ref 11.4–15.2)

## 2018-06-22 LAB — BRAIN NATRIURETIC PEPTIDE: B NATRIURETIC PEPTIDE 5: 615 pg/mL — AB (ref 0.0–100.0)

## 2018-06-22 LAB — URINALYSIS, ROUTINE W REFLEX MICROSCOPIC
BACTERIA UA: NONE SEEN
Bilirubin Urine: NEGATIVE
GLUCOSE, UA: NEGATIVE mg/dL
Hgb urine dipstick: NEGATIVE
KETONES UR: NEGATIVE mg/dL
NITRITE: NEGATIVE
PROTEIN: NEGATIVE mg/dL
Specific Gravity, Urine: 1.008 (ref 1.005–1.030)
pH: 6 (ref 5.0–8.0)

## 2018-06-22 LAB — DIGOXIN LEVEL

## 2018-06-22 LAB — COMPREHENSIVE METABOLIC PANEL
ALT: 13 U/L (ref 0–44)
ANION GAP: 6 (ref 5–15)
AST: 19 U/L (ref 15–41)
Albumin: 3.6 g/dL (ref 3.5–5.0)
Alkaline Phosphatase: 57 U/L (ref 38–126)
BILIRUBIN TOTAL: 0.8 mg/dL (ref 0.3–1.2)
BUN: 8 mg/dL (ref 6–20)
CHLORIDE: 109 mmol/L (ref 98–111)
CO2: 24 mmol/L (ref 22–32)
Calcium: 8.7 mg/dL — ABNORMAL LOW (ref 8.9–10.3)
Creatinine, Ser: 0.77 mg/dL (ref 0.44–1.00)
Glucose, Bld: 109 mg/dL — ABNORMAL HIGH (ref 70–99)
POTASSIUM: 3.3 mmol/L — AB (ref 3.5–5.1)
Sodium: 139 mmol/L (ref 135–145)
TOTAL PROTEIN: 7.4 g/dL (ref 6.5–8.1)

## 2018-06-22 LAB — INFLUENZA PANEL BY PCR (TYPE A & B)
INFLBPCR: NEGATIVE
Influenza A By PCR: NEGATIVE

## 2018-06-22 LAB — LACTIC ACID, PLASMA
LACTIC ACID, VENOUS: 1.2 mmol/L (ref 0.5–1.9)
Lactic Acid, Venous: 1 mmol/L (ref 0.5–1.9)

## 2018-06-22 LAB — MAGNESIUM: Magnesium: 1.9 mg/dL (ref 1.7–2.4)

## 2018-06-22 LAB — GROUP A STREP BY PCR: Group A Strep by PCR: NOT DETECTED

## 2018-06-22 MED ORDER — FENOFIBRATE 54 MG PO TABS
54.0000 mg | ORAL_TABLET | Freq: Every day | ORAL | Status: DC
  Administered 2018-06-23 – 2018-06-30 (×7): 54 mg via ORAL
  Filled 2018-06-22 (×8): qty 1

## 2018-06-22 MED ORDER — ALBUTEROL SULFATE (2.5 MG/3ML) 0.083% IN NEBU: 3.0000 mL | INHALATION_SOLUTION | Freq: Four times a day (QID) | RESPIRATORY_TRACT | Status: DC | PRN

## 2018-06-22 MED ORDER — ALPRAZOLAM 0.5 MG PO TABS
1.0000 mg | ORAL_TABLET | Freq: Every day | ORAL | Status: DC
  Administered 2018-06-23 – 2018-06-29 (×6): 1 mg via ORAL
  Filled 2018-06-22 (×7): qty 2

## 2018-06-22 MED ORDER — IPRATROPIUM-ALBUTEROL 0.5-2.5 (3) MG/3ML IN SOLN
3.0000 mL | Freq: Once | RESPIRATORY_TRACT | Status: AC
  Administered 2018-06-22: 3 mL via RESPIRATORY_TRACT
  Filled 2018-06-22: qty 3

## 2018-06-22 MED ORDER — SODIUM CHLORIDE 0.9 % IV SOLN
INTRAVENOUS | Status: AC
  Administered 2018-06-22: 20:00:00 via INTRAVENOUS

## 2018-06-22 MED ORDER — SODIUM CHLORIDE 0.9 % IV SOLN: 250.0000 mL | INTRAVENOUS | Status: DC | PRN

## 2018-06-22 MED ORDER — SODIUM CHLORIDE 0.9% FLUSH
3.0000 mL | Freq: Two times a day (BID) | INTRAVENOUS | Status: DC
  Administered 2018-06-23 – 2018-06-29 (×9): 3 mL via INTRAVENOUS

## 2018-06-22 MED ORDER — ATORVASTATIN CALCIUM 10 MG PO TABS
10.0000 mg | ORAL_TABLET | Freq: Every day | ORAL | Status: DC
  Administered 2018-06-23 – 2018-06-25 (×3): 10 mg via ORAL
  Filled 2018-06-22 (×3): qty 1

## 2018-06-22 MED ORDER — CARVEDILOL 12.5 MG PO TABS
12.5000 mg | ORAL_TABLET | Freq: Two times a day (BID) | ORAL | Status: DC
  Administered 2018-06-23 – 2018-06-26 (×5): 12.5 mg via ORAL
  Filled 2018-06-22 (×6): qty 1

## 2018-06-22 MED ORDER — VITAMIN D 25 MCG (1000 UNIT) PO TABS
1000.0000 [IU] | ORAL_TABLET | Freq: Every day | ORAL | Status: DC
  Administered 2018-06-23 – 2018-06-30 (×7): 1000 [IU] via ORAL
  Filled 2018-06-22: qty 1

## 2018-06-22 MED ORDER — MAGNESIUM SULFATE 2 GM/50ML IV SOLN
2.0000 g | Freq: Once | INTRAVENOUS | Status: AC
  Administered 2018-06-23: 2 g via INTRAVENOUS
  Filled 2018-06-22: qty 50

## 2018-06-22 MED ORDER — CLOPIDOGREL BISULFATE 75 MG PO TABS
75.0000 mg | ORAL_TABLET | Freq: Every day | ORAL | Status: DC
  Administered 2018-06-23 – 2018-06-28 (×6): 75 mg via ORAL
  Filled 2018-06-22 (×6): qty 1

## 2018-06-22 MED ORDER — SACUBITRIL-VALSARTAN 24-26 MG PO TABS
1.0000 | ORAL_TABLET | Freq: Two times a day (BID) | ORAL | Status: DC
  Administered 2018-06-23 – 2018-06-26 (×7): 1 via ORAL
  Filled 2018-06-22 (×10): qty 1

## 2018-06-22 MED ORDER — BENZONATATE 100 MG PO CAPS
100.0000 mg | ORAL_CAPSULE | Freq: Three times a day (TID) | ORAL | Status: DC | PRN
  Administered 2018-06-23 (×2): 100 mg via ORAL
  Filled 2018-06-22 (×2): qty 1

## 2018-06-22 MED ORDER — POTASSIUM CHLORIDE CRYS ER 20 MEQ PO TBCR
40.0000 meq | EXTENDED_RELEASE_TABLET | Freq: Once | ORAL | Status: AC
  Administered 2018-06-22: 40 meq via ORAL
  Filled 2018-06-22: qty 2

## 2018-06-22 MED ORDER — SODIUM CHLORIDE 0.9 % IV SOLN
INTRAVENOUS | Status: DC
  Administered 2018-06-22: 16:00:00 via INTRAVENOUS

## 2018-06-22 MED ORDER — SODIUM CHLORIDE 0.9% FLUSH: 3.0000 mL | INTRAVENOUS | Status: DC | PRN

## 2018-06-22 MED ORDER — FUROSEMIDE 20 MG PO TABS
20.0000 mg | ORAL_TABLET | Freq: Every day | ORAL | Status: DC
  Administered 2018-06-25 – 2018-06-30 (×6): 20 mg via ORAL
  Filled 2018-06-22 (×7): qty 1

## 2018-06-22 MED ORDER — ACETAMINOPHEN 325 MG PO TABS
650.0000 mg | ORAL_TABLET | ORAL | Status: DC | PRN
  Administered 2018-06-23 – 2018-06-25 (×4): 650 mg via ORAL
  Filled 2018-06-22 (×4): qty 2

## 2018-06-22 MED ORDER — SODIUM CHLORIDE 0.9 % IV BOLUS
500.0000 mL | Freq: Once | INTRAVENOUS | Status: AC
  Administered 2018-06-22: 500 mL via INTRAVENOUS

## 2018-06-22 MED ORDER — DIGOXIN 125 MCG PO TABS
0.1250 mg | ORAL_TABLET | ORAL | Status: DC
  Administered 2018-06-23 – 2018-06-29 (×4): 0.125 mg via ORAL
  Filled 2018-06-22 (×5): qty 1

## 2018-06-22 MED ORDER — ASPIRIN EC 81 MG PO TBEC
81.0000 mg | DELAYED_RELEASE_TABLET | Freq: Every day | ORAL | Status: DC
  Administered 2018-06-23 – 2018-06-30 (×7): 81 mg via ORAL
  Filled 2018-06-22 (×7): qty 1

## 2018-06-22 MED ORDER — HEPARIN SODIUM (PORCINE) 5000 UNIT/ML IJ SOLN
5000.0000 [IU] | Freq: Three times a day (TID) | INTRAMUSCULAR | Status: DC
  Filled 2018-06-22 (×2): qty 1

## 2018-06-22 NOTE — ED Notes (Signed)
Patient transported to X-ray 

## 2018-06-22 NOTE — ED Triage Notes (Signed)
Pt was seen at PCP and had 80/50 BP reading. Denies symptoms from low BP. States she has had a cold for the past month and was there to be evaluated for that when PCP told her to come to ED.

## 2018-06-22 NOTE — Telephone Encounter (Signed)
A1C and fasting lipid panel ordered per MD request.

## 2018-06-22 NOTE — ED Notes (Addendum)
unable to Interrogate with Medtronic interrogator, not working.  EDP made aware.

## 2018-06-22 NOTE — Assessment & Plan Note (Signed)
Reports significant pain with swallowing resulting inb poor oral intake , both solid and liquid x 10 days

## 2018-06-22 NOTE — Telephone Encounter (Deleted)
[id number]

## 2018-06-22 NOTE — Assessment & Plan Note (Signed)
Symptoms have improved, needs rept imaging to establish cure

## 2018-06-22 NOTE — ED Notes (Signed)
Ronalee Belts with [facility] received report on pt, ETA 20 min.

## 2018-06-22 NOTE — Assessment & Plan Note (Signed)
disabling cough with sputum production x 3 weeks, no improvement despite antibiotic course, pt sent to the Ed formanagement

## 2018-06-22 NOTE — ED Provider Notes (Signed)
[facility] EMERGENCY DEPARTMENT Provider Note   CSN: [id number] Arrival date & time: 06/22/18  1450     History   Chief Complaint Chief Complaint  Patient presents with  . Hypotension    HPI [name] is a 52 y.o. female.  HPI  Pt was seen at 1510. Per pt, c/o gradual onset and persistence of constant cough, runny/stuffy nose, sinus congestion and sore throat for the past 1 month. Pt states she has had poor PO intake for the past 1 week. States she has been seen by her PMD 3 weeks ago, dx sinus infection, rx abx. States she continues to have symptoms, so she went back to her PMD today. Pt was then sent to the ED for further evaluation due to "low blood pressure." Pt denies N/V, other than post-tussive emesis. Denies diarrhea, no rash, no fevers, no back pain, no abd pain, no CP/SOB, no focal motor weakness.   Past Medical History:  Diagnosis Date  . Arthritis of knee   . Automatic implantable cardiac defibrillator in situ    a. s/p prior Medtronic ICD with 6949 lead. b. s/p Gen change & lead revision 05/2013.  . Cardiomyopathy secondary    Patient has cardiomyopathy out of proportion to her ischemic heart disease  . Chronic pain   . coronary artery disease    RCA stenting Myoview 2011 EF 30% infarction dilatation without ischemia  . Depression   . Diabetes mellitus   . GERD (gastroesophageal reflux disease)   . Insomnia   . Lupus (systemic lupus erythematosus) ([place])   . Membranous glomerulonephritis   . Migraines   . Myocardial infarction ([place])    8 total   . Nicotine abuse   . Other and unspecified hyperlipidemia   . Paroxysmal VT ([place])   . Systolic CHF ([place])   . VF (ventricular fibrillation) ([place])    a. Hx appropriate ICD therapy for VF.    Patient Active Problem List   Diagnosis Date Noted  . Hypotension 06/22/2018  . Acute shoulder pain due to trauma, left 03/09/2018  . Hyperkalemia 01/01/2018  . Hypermagnesemia 01/01/2018  . Hypokalemia  12/25/2017  . Hypomagnesemia 12/25/2017  . Chronic systolic heart failure ([place]) 12/09/2017  . Onychomycosis 11/04/2017  . VF (ventricular fibrillation) ([place])   . Systolic CHF ([place])   . Paroxysmal VT ([place])   . Nicotine abuse   . Myocardial infarction ([place])   . Migraines   . Lupus (systemic lupus erythematosus) ([place])   . Insomnia   . Depression   . Arthritis of knee   . Chest pain with high risk for cardiac etiology 06/29/2017  . GERD (gastroesophageal reflux disease) 06/29/2017  . Muscle spasm of back 03/04/2017  . Vitamin D deficiency 07/26/2015  . Depression with anxiety 03/11/2015  . Metabolic syndrome X 83/41/9622  . Adhesive capsulitis of left shoulder 09/26/2013  . Ventricular tachycardia (paroxysmal) ([place]) 12/17/2012  . Nicotine dependence 11/07/2012  . HSV-2 seropositive 11/05/2012  . H/O abnormal Pap smear 11/02/2012  . Fatty liver 08/10/2012  . HTN, goal below 130/80 09/13/2011  . Acute sinusitis 09/11/2011  . Allergic rhinitis 09/11/2011  . Ischemic cardiomyopathy 05/27/2011  . Implantable cardioverter-defibrillator (ICD) in situ 05/27/2011  . 6949 defibrillator lead 05/27/2011  . FATIGUE 06/05/2009  . Ventricular fibrillation ([place]) 02/04/2009  . Morbid obesity ([place]) 12/20/2008  . Diabetes mellitus type 2 with complications ([place]) 29/79/8921  . Hyperlipidemia LDL goal <100 08/31/2007  . ANXIETY 08/31/2007  . Acute bronchitis with COPD (  [place]) 08/31/2007  . SLE 08/31/2007  . ARTHRITIS, KNEES, BILATERAL 08/31/2007  . AVASCULAR NECROSIS 08/31/2007  . MIGRAINES, HX OF 08/31/2007    Past Surgical History:  Procedure Laterality Date  . ABDOMINAL HYSTERECTOMY    . CHOLECYSTECTOMY    . COLONOSCOPY  07/15/2011   SLF: internal hemorrhoids/hyperplastic polyps in the rectum/tubular adenomaSURVEILLANCE Nov 2017  . defibrillator placed   2009 and 05/2013  . ICD GENERATOR CHANGE  06/17/2013   Dr Caryl Comes  . IMPLANTABLE CARDIOVERTER DEFIBRILLATOR (ICD) GENERATOR CHANGE Left  06/17/2013   Procedure: ICD GENERATOR CHANGE;  Surgeon: Deboraha Sprang, MD;  [facility] CATH LAB;  Service: Cardiovascular;  Laterality: Left;  . LEAD REVISION N/A 06/17/2013   Procedure: LEAD REVISION;  Surgeon: Deboraha Sprang, MD;  Location: [facility] CATH LAB;  Service: Cardiovascular;  Laterality: N/A;  . ROTATOR CUFF REPAIR Right 2002     OB History   None      Home Medications    Prior to Admission medications   Medication Sig Start Date End Date Taking? Authorizing Provider  albuterol (PROVENTIL HFA;VENTOLIN HFA) 108 (90 Base) MCG/ACT inhaler Inhale 2 puffs into the lungs every 6 (six) hours as needed for wheezing or shortness of breath. 03/13/16   Fayrene Helper, MD  ALPRAZolam Duanne Moron) 1 MG tablet Take 1 tablet (1 mg total) by mouth at bedtime. 03/09/18   Fayrene Helper, MD  aspirin 81 MG tablet Take 1 tablet (81 mg total) by mouth daily. 09/26/13   Deboraha Sprang, MD  atorvastatin (LIPITOR) 10 MG tablet Take 1 tablet (10 mg total) by mouth daily. 05/24/18   Deboraha Sprang, MD  carvedilol (COREG) 12.5 MG tablet Take 1 tablet (12.5 mg total) by mouth 2 (two) times daily. 12/21/17   Bensimhon, Shaune Pascal, MD  cholecalciferol (VITAMIN D) 1000 units tablet Take 1,000 Units by mouth daily.    [provider]  clopidogrel (PLAVIX) 75 MG tablet Take 1 tablet (75 mg total) by mouth daily. 11/04/17   Fayrene Helper, MD  cyclobenzaprine (FLEXERIL) 5 MG tablet Take 1 tablet (5 mg total) by mouth at bedtime. 03/09/18   Fayrene Helper, MD  digoxin (LANOXIN) 0.125 MG tablet Take 1 tablet (0.125 mg total) by mouth every other day. 05/27/18   Deboraha Sprang, MD  fenofibrate (TRICOR) 145 MG tablet Take 1 tablet (145 mg total) by mouth daily. 05/24/18   Deboraha Sprang, MD  furosemide (LASIX) 20 MG tablet Take 20 mg by mouth daily.    [provider]  oxyCODONE (ROXICODONE) 15 MG immediate release tablet Take by mouth.    [provider]  sacubitril-valsartan  (ENTRESTO) 24-26 MG Take 1 tablet by mouth 2 (two) times daily. 05/24/18   Deboraha Sprang, MD    Family History Family History  Problem Relation Age of Onset  . Hepatitis Mother 65       HCV  . Liver cancer Mother 63  . Cancer Mother   . Diabetes Mother   . Heart disease Mother   . Hyperlipidemia Mother   . Stroke Sister 45       x3  . Diabetes Sister   . Hyperlipidemia Sister   . Dementia Father   . HIV Sister   . Diabetes Brother   . Heart disease Brother   . Hyperlipidemia Brother     Social History Social History   Tobacco Use  . Smoking status: Current Some Day Smoker    Packs/day:  0.50    Years: 15.00    Pack years: 7.50    Types: Cigarettes  . Smokeless tobacco: Never Used  Substance Use Topics  . Alcohol use: Yes    Alcohol/week: 0.0 standard drinks    Comment: occasionaly  . Drug use: No     Allergies   Amiodarone; Bee venom; Mexiletine; Cortisone; and Robaxin [methocarbamol]   Review of Systems Review of Systems ROS: Statement: All systems negative except as marked or noted in the HPI; Constitutional: Negative for fever and chills. ; ; Eyes: Negative for eye pain, redness and discharge. ; ; ENMT: Negative for ear pain, hoarseness, +nasal congestion, rhinorrhea, sinus pressure and sore throat. ; ; Cardiovascular: Negative for chest pain, palpitations, diaphoresis, dyspnea and peripheral edema. ; ; Respiratory: +cough with post-tussive emesis. Negative for wheezing and stridor. ; ; Gastrointestinal: Negative for nausea, vomiting, diarrhea, abdominal pain, blood in stool, hematemesis, jaundice and rectal bleeding. . ; ; Genitourinary: Negative for dysuria, flank pain and hematuria. ; ; Musculoskeletal: Negative for back pain and neck pain. Negative for swelling and trauma.; ; Skin: Negative for pruritus, rash, abrasions, blisters, bruising and skin lesion.; ; Neuro: Negative for headache, lightheadedness and neck stiffness. Negative for weakness, altered level  of consciousness, altered mental status, extremity weakness, paresthesias, involuntary movement, seizure and syncope.       Physical Exam Updated Vital Signs BP (!) 93/56   Pulse 80   Temp 97.6 F (36.4 C) (Oral)   Resp 18   Ht 5\' 6"  (1.676 m)   Wt 94.3 kg   SpO2 96%   BMI 33.54 kg/m    Patient Vitals for the past 24 hrs:  BP Temp Temp src Pulse Resp SpO2 Height Weight  06/22/18 1730 115/67 - - (!) 43 (!) 25 95 % - -  06/22/18 1715 - - - 62 17 100 % - -  06/22/18 1700 (!) 100/52 - - 80 19 97 % - -  06/22/18 1648 - - - (!) 26 19 98 % - -  06/22/18 1645 - - - (!) 56 16 96 % - -  06/22/18 1632 98/60 - - (!) 57 16 100 % - -  06/22/18 1630 - - - (!) 55 (!) 22 99 % - -  06/22/18 1500 (!) 93/56 - - 80 - 96 % - -  06/22/18 1454 (!) 89/54 97.6 F (36.4 C) Oral 68 18 95 % 5\' 6"  (1.676 m) 94.3 kg    15:20 Orthostatic Vital Signs DF  Orthostatic Lying   BP- Lying: 90/53   Pulse- Lying: 78       Orthostatic Sitting  BP- Sitting: 105/92Abnormal    Pulse- Sitting: 84       Orthostatic Standing at 0 minutes  BP- Standing at 0 minutes: 103/57   Pulse- Standing at 0 minutes: 85   18:14 Orthostatic Vital Signs DF  Orthostatic Lying   BP- Lying: 103/55   Pulse- Lying: 80       Orthostatic Sitting  BP- Sitting: 110/81   Pulse- Sitting: 82       Orthostatic Standing at 0 minutes  BP- Standing at 0 minutes: 102/82   Pulse- Standing at 0 minutes: 84      Physical Exam 1515: Physical examination:  Nursing notes reviewed; Vital signs and O2 SAT reviewed;  Constitutional: Well developed, Well nourished, Well hydrated, In no acute distress; Head:  Normocephalic, atraumatic; Eyes: EOMI, PERRL, No scleral icterus; ENMT: Mouth and pharynx normal, Mucous membranes moist;  Neck: Supple, Full range of motion, No lymphadenopathy; Cardiovascular: Regular rate and rhythm with frequent ectopy, No gallop; Respiratory: Breath sounds clear & equal bilaterally, No wheezes. +moist cough during  exam. Speaking full sentences with ease, Normal respiratory effort/excursion; Chest: Nontender, Movement normal; Abdomen: Soft, Nontender, Nondistended, Normal bowel sounds; Genitourinary: No CVA tenderness; Extremities: Peripheral pulses normal, No tenderness, No edema, No calf edema or asymmetry.; Neuro: AA&Ox3, Major CN grossly intact.  Speech clear. No gross focal motor or sensory deficits in extremities.; Skin: Color normal, Warm, Dry.   ED Treatments / Results  Labs (all labs ordered are listed, but only abnormal results are displayed)   EKG EKG Interpretation  Date/Time:  Tuesday June 22 2018 15:08:17 EST Ventricular Rate:  78 PR Interval:    QRS Duration: 129 QT Interval:  424 QTC Calculation: 483 R Axis:   67 Text Interpretation:  Sinus rhythm Ventricular trigeminy Nonspecific intraventricular conduction delay Repol abnrm, severe global ischemia (LM/MVD) When compared with ECG of 04/13/2018 ventricular trigeminy is now present Confirmed by Francine Graven [phone]) on 06/22/2018 3:22:21 PM   Radiology   Procedures Procedures (including critical care time)  Medications Ordered in ED Medications  ipratropium-albuterol (DUONEB) 0.5-2.5 (3) MG/3ML nebulizer solution 3 mL (has no administration in time range)  sodium chloride 0.9 % bolus 500 mL (has no administration in time range)  0.9 %  sodium chloride infusion (has no administration in time range)     Initial Impression / Assessment and Plan / ED Course  I have reviewed the triage vital signs and the nursing notes.  Pertinent labs & imaging results that were available during my care of the patient were reviewed by me and considered in my medical decision making (see chart for details).  MDM Reviewed: previous chart, nursing note and vitals Reviewed previous: labs and ECG Interpretation: labs, ECG and x-ray Total time providing critical care: 30-74 minutes. This excludes time spent performing separately reportable  procedures and services. Consults: admitting MD    CRITICAL CARE Performed by: Francine Graven Total critical care time: 35 minutes Critical care time was exclusive of separately billable procedures and treating other patients. Critical care was necessary to treat or prevent imminent or life-threatening deterioration. Critical care was time spent personally by me on the following activities: development of treatment plan with patient and/or surrogate as well as nursing, discussions with consultants, evaluation of patient's response to treatment, examination of patient, obtaining history from patient or surrogate, ordering and performing treatments and interventions, ordering and review of laboratory studies, ordering and review of radiographic studies, pulse oximetry and re-evaluation of patient's condition.   Results for orders placed or performed during the hospital encounter of 06/22/18  Group A Strep by PCR  Result Value Ref Range   Group A Strep by PCR NOT DETECTED NOT DETECTED  Comprehensive metabolic panel  Result Value Ref Range   Sodium 139 135 - 145 mmol/L   Potassium 3.3 (L) 3.5 - 5.1 mmol/L   Chloride 109 98 - 111 mmol/L   CO2 24 22 - 32 mmol/L   Glucose, Bld 109 (H) 70 - 99 mg/dL   BUN 8 6 - 20 mg/dL   Creatinine, Ser 0.77 0.44 - 1.00 mg/dL   Calcium 8.7 (L) 8.9 - 10.3 mg/dL   Total Protein 7.4 6.5 - 8.1 g/dL   Albumin 3.6 3.5 - 5.0 g/dL   AST 19 15 - 41 U/L   ALT 13 0 - 44 U/L   Alkaline Phosphatase 57 38 -  126 U/L   Total Bilirubin 0.8 0.3 - 1.2 mg/dL   GFR calc non Af Amer >60 >60 mL/min   GFR calc Af Amer >60 >60 mL/min   Anion gap 6 5 - 15  Troponin I  Result Value Ref Range   Troponin I <0.03 <0.03 ng/mL  Lactic acid, plasma  Result Value Ref Range   Lactic Acid, Venous 1.2 0.5 - 1.9 mmol/L  Lactic acid, plasma  Result Value Ref Range   Lactic Acid, Venous 1.0 0.5 - 1.9 mmol/L  Brain natriuretic peptide  Result Value Ref Range   B Natriuretic Peptide  615.0 (H) 0.0 - 100.0 pg/mL  CBC with Differential  Result Value Ref Range   WBC 5.4 4.0 - 10.5 K/uL   RBC 4.79 3.87 - 5.11 MIL/uL   Hemoglobin 12.6 12.0 - 15.0 g/dL   HCT 40.4 36.0 - 46.0 %   MCV 84.3 80.0 - 100.0 fL   MCH 26.3 26.0 - 34.0 pg   MCHC 31.2 30.0 - 36.0 g/dL   RDW 13.6 11.5 - 15.5 %   Platelets 264 150 - 400 K/uL   nRBC 0.0 0.0 - 0.2 %   Neutrophils Relative % 44 %   Neutro Abs 2.4 1.7 - 7.7 K/uL   Lymphocytes Relative 47 %   Lymphs Abs 2.6 0.7 - 4.0 K/uL   Monocytes Relative 7 %   Monocytes Absolute 0.4 0.1 - 1.0 K/uL   Eosinophils Relative 1 %   Eosinophils Absolute 0.1 0.0 - 0.5 K/uL   Basophils Relative 1 %   Basophils Absolute 0.0 0.0 - 0.1 K/uL   Immature Granulocytes 0 %   Abs Immature Granulocytes 0.01 0.00 - 0.07 K/uL  Protime-INR  Result Value Ref Range   Prothrombin Time 13.4 11.4 - 15.2 seconds   INR 1.03   Urinalysis, Routine w reflex microscopic  Result Value Ref Range   Color, Urine YELLOW YELLOW   APPearance CLEAR CLEAR   Specific Gravity, Urine 1.008 1.005 - 1.030   pH 6.0 5.0 - 8.0   Glucose, UA NEGATIVE NEGATIVE mg/dL   Hgb urine dipstick NEGATIVE NEGATIVE   Bilirubin Urine NEGATIVE NEGATIVE   Ketones, ur NEGATIVE NEGATIVE mg/dL   Protein, ur NEGATIVE NEGATIVE mg/dL   Nitrite NEGATIVE NEGATIVE   Leukocytes, UA SMALL (A) NEGATIVE   RBC / HPF 0-5 0 - 5 RBC/hpf   WBC, UA 11-20 0 - 5 WBC/hpf   Bacteria, UA NONE SEEN NONE SEEN   Squamous Epithelial / LPF 0-5 0 - 5   Mucus PRESENT    Trichomonas, UA PRESENT (A) NONE SEEN  Magnesium  Result Value Ref Range   Magnesium 1.9 1.7 - 2.4 mg/dL  Influenza panel by PCR (type A & B)  Result Value Ref Range   Influenza A By PCR NEGATIVE NEGATIVE   Influenza B By PCR NEGATIVE NEGATIVE  Digoxin level  Result Value Ref Range   Digoxin Level <0.2 (L) 0.8 - 2.0 ng/mL    Dg Chest 2 View Result Date: 06/22/2018 CLINICAL DATA:  Cough EXAM: CHEST - 2 VIEW COMPARISON:  06/02/2018 FINDINGS: Left-sided  pacing device as before. Cardiomegaly. No focal opacity or pleural effusion. No pneumothorax. IMPRESSION: No active cardiopulmonary disease.  Cardiomegaly. Electronically Signed   By: Donavan Foil M.D.   On: 06/22/2018 15:55     1950:   Potassium repleted PO. Pt initially orthostatic, judicious IVF given with initial improvement, now SBP drifting back to 90's and 80's. Pt asymptomatic, has  been ambulatory with steady gait, easy resps, NAD, Sats 99% R/A.  Medtronic Interrogator unavailable for pacer check. Bedside heart monitor recording HR 40-50's while in the ED, NSR with very frequent ectopy. Pt denies CP.  Pt states last month she was told her was "increasing fluid" but her lasix was not increased "because my potassium was low;" states she was told to change dosing of her digoxin to every other day. Pt's level today is subtherapeutic.  T/C returned from [facility] Cards Dr. Radford Pax, case discussed, including:  HPI, pertinent PM/SHx, VS/PE, dx testing, ED course and treatment:  OK not to interrogate pacer at [facility] pt will need transfer [facility] to [facility] stepdown under Dr. Clayborne Dana service. Dx and testing, as well as d/w Cards MD, d/w pt and family.  Questions answered.  Verb understanding, agreeable to transfer to [facility] for admit.         Final Clinical Impressions(s) / ED Diagnoses   Final diagnoses:  None    ED Discharge Orders    None       Francine Graven, DO 06/26/18 0745

## 2018-06-22 NOTE — Patient Instructions (Signed)
You need to go directly to the Ed for further evaluation and management , I am contacting the ED Physician

## 2018-06-22 NOTE — Assessment & Plan Note (Signed)
Marked hypotension and bradycardia in symptomatic patient with heart disease and defibrillator , needs hospitalization for assessment and TREATMENT. pT TAKEN BY HER DAUGHTER IN private vehicle to ED, I personally discussed the case with the ED Provider prior to her arrival. She was neither hypoxic or tachypneic and was deemed medically stable for transport vis private vehicle

## 2018-06-22 NOTE — ED Notes (Signed)
ED Provider at bedside. 

## 2018-06-22 NOTE — Progress Notes (Signed)
   [name]     MRN: [id number]      DOB: 14-Sep-1965   HPI [name] is here for follow up of acute bronchirtis and sinusitis. States she is coughing excessively, sputum is thick.States her sinus drainage and facial has improved. C/o painful swallowing which is preventing her from eating or drinking for the past 1 week Feels weak, no improvement since starting treatment C/o thick vaginal d/c C/o chills, no documented fever ROS .  chest pains with excessive coughing spells, denies  leg swelling Denies abdominal pain, nausea, vomiting,diarrhea or constipation.   Denies dysuria, frequency, hesitancy or incontinence. Denies joint pain, swelling and limitation in mobility. Denies headaches, seizures, numbness, or tingling. C/o poor sleep due to excess cough and fatigue. Denies skin break down or rash.   PE  BP (!) 80/50   Pulse (!) 54   Temp 97.6 F (36.4 C) (Oral)   Resp 12   Ht 5\' 6"  (1.676 m)   Wt 207 lb 1.3 oz (93.9 kg)   SpO2 98% Comment: room air  BMI 33.42 kg/m   Patient alert , ill appearing and  HYPOTENSIVE and BRADYCARDIC  HEENT: No facial asymmetry, EOMI,   oropharynx pink and moist.  Neck supple no JVD, no mass.  Chest: Clear to auscultation bilaterally.  CVS: S1, S2 no murmurs, no S3.Regular rate.  ABD: Soft non tender.   Ext: No edema  MS: Adequate ROM spine, shoulders, hips and knees.  Skin: Intact, no ulcerations or rash noted.  Psych: Good eye contact, normal affect. Memory intact not anxious or depressed appearing.  CNS: CN 2-12 intact, power,  normal throughout.no focal deficits noted.   Assessment & Plan  Hypotension Marked hypotension and bradycardia in symptomatic patient with heart disease and defibrillator , needs hospitalization for assessment and TREATMENT. pT TAKEN BY HER DAUGHTER IN private vehicle to ED, I personally discussed the case with the ED Provider prior to her arrival. She was neither hypoxic or tachypneic and  was deemed medically stable for transport vis private vehicle  Acute sinusitis Symptoms have improved, needs rept imaging to establish cure  Bronchitis disabling cough with sputum production x 3 weeks, no improvement despite antibiotic course, pt sent to the Ed formanagement  Odynophagia Reports significant pain with swallowing resulting inb poor oral intake , both solid and liquid x 10 days

## 2018-06-22 NOTE — ED Notes (Signed)
Attempted to call report to receiving nurse, nurse stated she would call back for report.

## 2018-06-23 ENCOUNTER — Ambulatory Visit: Payer: Medicare Other | Admitting: Family Medicine

## 2018-06-23 ENCOUNTER — Observation Stay (HOSPITAL_COMMUNITY): Payer: Medicare Other

## 2018-06-23 DIAGNOSIS — Z720 Tobacco use: Secondary | ICD-10-CM

## 2018-06-23 DIAGNOSIS — I5022 Chronic systolic (congestive) heart failure: Secondary | ICD-10-CM

## 2018-06-23 DIAGNOSIS — I952 Hypotension due to drugs: Secondary | ICD-10-CM

## 2018-06-23 DIAGNOSIS — R079 Chest pain, unspecified: Secondary | ICD-10-CM | POA: Diagnosis not present

## 2018-06-23 LAB — LIPID PANEL
CHOL/HDL RATIO: 4 (calc) (ref <5.0)
CHOLESTEROL: 91 mg/dL (ref <200)
HDL: 23 mg/dL — AB (ref >50)
LDL CHOLESTEROL (CALC): 50 mg/dL
Non-HDL Cholesterol (Calc): 68 mg/dL (calc) (ref <130)
TRIGLYCERIDES: 94 mg/dL (ref <150)

## 2018-06-23 LAB — COMPREHENSIVE METABOLIC PANEL
ALBUMIN: 3.1 g/dL — AB (ref 3.5–5.0)
ALT: 11 U/L (ref 0–44)
AST: 19 U/L (ref 15–41)
Alkaline Phosphatase: 55 U/L (ref 38–126)
Anion gap: 7 (ref 5–15)
BUN: 6 mg/dL (ref 6–20)
CHLORIDE: 112 mmol/L — AB (ref 98–111)
CO2: 22 mmol/L (ref 22–32)
CREATININE: 0.79 mg/dL (ref 0.44–1.00)
Calcium: 8.7 mg/dL — ABNORMAL LOW (ref 8.9–10.3)
GFR calc Af Amer: >60 mL/min (ref >60)
GFR calc non Af Amer: >60 mL/min (ref >60)
GLUCOSE: 122 mg/dL — AB (ref 70–99)
POTASSIUM: 3.8 mmol/L (ref 3.5–5.1)
SODIUM: 141 mmol/L (ref 135–145)
Total Bilirubin: 0.4 mg/dL (ref 0.3–1.2)
Total Protein: 6.4 g/dL — ABNORMAL LOW (ref 6.5–8.1)

## 2018-06-23 LAB — HEMOGLOBIN A1C
EAG (MMOL/L): 8.1 (calc)
Hgb A1c MFr Bld: 6.7 % of total Hgb — ABNORMAL HIGH (ref <5.7)
MEAN PLASMA GLUCOSE: 146 (calc)

## 2018-06-23 LAB — BASIC METABOLIC PANEL
ANION GAP: 2 — AB (ref 5–15)
BUN: 7 mg/dL (ref 6–20)
CALCIUM: 8.7 mg/dL — AB (ref 8.9–10.3)
CO2: 27 mmol/L (ref 22–32)
Chloride: 112 mmol/L — ABNORMAL HIGH (ref 98–111)
Creatinine, Ser: 0.84 mg/dL (ref 0.44–1.00)
GLUCOSE: 150 mg/dL — AB (ref 70–99)
POTASSIUM: 3.7 mmol/L (ref 3.5–5.1)
SODIUM: 141 mmol/L (ref 135–145)

## 2018-06-23 LAB — TSH: TSH: 1.762 u[IU]/mL (ref 0.350–4.500)

## 2018-06-23 LAB — URINE CULTURE: Culture: <10000 — AB

## 2018-06-23 LAB — MRSA PCR SCREENING: MRSA BY PCR: NEGATIVE

## 2018-06-23 LAB — TROPONIN I: TROPONIN I: 0.1 ng/mL — AB (ref <0.03)

## 2018-06-23 MED ORDER — ALUM & MAG HYDROXIDE-SIMETH 200-200-20 MG/5ML PO SUSP
30.0000 mL | Freq: Once | ORAL | Status: AC
  Administered 2018-06-23: 30 mL via ORAL
  Filled 2018-06-23: qty 30

## 2018-06-23 MED ORDER — ONDANSETRON HCL 4 MG/2ML IJ SOLN
INTRAMUSCULAR | Status: AC
  Administered 2018-06-23: 4 mg
  Filled 2018-06-23: qty 2

## 2018-06-23 MED ORDER — NITROGLYCERIN 0.4 MG SL SUBL
SUBLINGUAL_TABLET | SUBLINGUAL | Status: AC
  Administered 2018-06-23: 11:00:00
  Filled 2018-06-23: qty 1

## 2018-06-23 MED ORDER — HYDROMORPHONE HCL 1 MG/ML IJ SOLN
1.0000 mg | Freq: Once | INTRAMUSCULAR | Status: AC
  Administered 2018-06-23: 1 mg via INTRAVENOUS

## 2018-06-23 MED ORDER — POTASSIUM CHLORIDE CRYS ER 10 MEQ PO TBCR
20.0000 meq | EXTENDED_RELEASE_TABLET | Freq: Every day | ORAL | Status: DC
  Administered 2018-06-23 – 2018-06-30 (×8): 20 meq via ORAL
  Filled 2018-06-23 (×9): qty 2

## 2018-06-23 MED ORDER — ONDANSETRON HCL 4 MG/2ML IJ SOLN: 4.0000 mg | Freq: Three times a day (TID) | INTRAMUSCULAR | Status: DC | PRN

## 2018-06-23 MED ORDER — POTASSIUM CHLORIDE CRYS ER 20 MEQ PO TBCR: 20.0000 meq | EXTENDED_RELEASE_TABLET | Freq: Once | ORAL | Status: AC

## 2018-06-23 MED ORDER — MORPHINE SULFATE (PF) 2 MG/ML IV SOLN: 2.0000 mg | Freq: Once | INTRAVENOUS | Status: DC

## 2018-06-23 MED ORDER — IOPAMIDOL (ISOVUE-370) INJECTION 76%
100.0000 mL | Freq: Once | INTRAVENOUS | Status: AC | PRN
  Administered 2018-06-23: 100 mL via INTRAVENOUS

## 2018-06-23 MED ORDER — FUROSEMIDE 10 MG/ML IJ SOLN
20.0000 mg | Freq: Once | INTRAMUSCULAR | Status: AC
  Administered 2018-06-23: 20 mg via INTRAVENOUS
  Filled 2018-06-23: qty 2

## 2018-06-23 MED ORDER — HYDROMORPHONE HCL 1 MG/ML IJ SOLN
INTRAMUSCULAR | Status: AC
  Administered 2018-06-23: 1 mg via INTRAVENOUS
  Filled 2018-06-23: qty 1

## 2018-06-23 MED ORDER — MORPHINE SULFATE (PF) 2 MG/ML IV SOLN
INTRAVENOUS | Status: AC
  Administered 2018-06-23: 2 mg via INTRAVENOUS
  Filled 2018-06-23: qty 1

## 2018-06-23 MED ORDER — LIDOCAINE VISCOUS HCL 2 % MT SOLN
15.0000 mL | Freq: Once | OROMUCOSAL | Status: AC
  Administered 2018-06-23: 15 mL via ORAL
  Filled 2018-06-23: qty 15

## 2018-06-23 MED ORDER — POTASSIUM CHLORIDE CRYS ER 20 MEQ PO TBCR: 20.0000 meq | EXTENDED_RELEASE_TABLET | Freq: Every day | ORAL | 6 refills | Status: AC

## 2018-06-23 NOTE — H&P (Signed)
Cardiology Admission History and Physical:   Patient ID: [name] MRN: [id number]; DOB: 30-Sep-1965   Admission date: 06/22/2018  Primary Care Provider: Fayrene Helper, MD Primary Cardiologist: No primary care provider on file.  Primary Electrophysiologist:  None   Chief Complaint:    Patient Profile:   [name] is a 52 y.o. female with CAD s/p PCI to RCA 2011, mixed ICM/NICM NYHA II, ACC B (EF 20%, s/p ICD) c/b NSVT, SLE, GERD, and ongoing tobacco use who is admitted with hypotension.   History of Present Illness:   According to the patient she had been in her usual state of health until approximately 1 month prior to admission. At that time she developed worsening cough productive of white/yellow phleghm and some nasal congestion. She associated fevers or worsening SOB. She was seen by her PMD 1 week ago and prescribed antibiotics for presumed URI, but her symptoms did not abate. She re-presented to her PMDs office for ongoing symptoms today and was noted to be hypotensive for which her PMD referred her to the ED.   Upon arrival in ED, patient BP 93/56 , asymptomatic, concerned only by ongoing cough. Labs notable for hypokalemia (K 3.3), Cr 0.8 (at baseline), and Mg of 1.9. BNP elevated at 615 from 113 6 months ago. Lactate within normal limits and troponin negative. No leukocytosis or left shift. CXR without consolidation or clear infiltrate. She was given IV fluids and cardiology was consulted for admission.   On review of systems patient additionally denies lightheadedness or dizziness, no interval decrease in exercise tolerance, no worsening orthopnea, bendopnea, or PND. She notes her hands "feel tight" which usually means she has some extra fluid on board. She does report a few episodes of post-tussive emesis and associated throat pain with swallowing. She reports frequent palpitations, but states this is unchanged from her prior baseline. No syncope or  device shocks.    Past Medical History:  Diagnosis Date  . Arthritis of knee   . Automatic implantable cardiac defibrillator in situ    a. s/p prior Medtronic ICD with 6949 lead. b. s/p Gen change & lead revision 05/2013.  . Cardiomyopathy secondary    Patient has cardiomyopathy out of proportion to her ischemic heart disease  . Chronic pain   . coronary artery disease    RCA stenting Myoview 2011 EF 30% infarction dilatation without ischemia  . Depression   . Diabetes mellitus   . GERD (gastroesophageal reflux disease)   . Insomnia   . Lupus (systemic lupus erythematosus) ([place])   . Membranous glomerulonephritis   . Migraines   . Myocardial infarction ([place])    8 total   . Nicotine abuse   . Other and unspecified hyperlipidemia   . Paroxysmal VT ([place])   . Systolic CHF ([place])   . VF (ventricular fibrillation) ([place])    a. Hx appropriate ICD therapy for VF.    Past Surgical History:  Procedure Laterality Date  . ABDOMINAL HYSTERECTOMY    . CHOLECYSTECTOMY    . COLONOSCOPY  07/15/2011   SLF: internal hemorrhoids/hyperplastic polyps in the rectum/tubular adenomaSURVEILLANCE Nov 2017  . defibrillator placed   2009 and 05/2013  . ICD GENERATOR CHANGE  06/17/2013   Dr Caryl Comes  . IMPLANTABLE CARDIOVERTER DEFIBRILLATOR (ICD) GENERATOR CHANGE Left 06/17/2013   Procedure: ICD GENERATOR CHANGE;  Surgeon: Deboraha Sprang, MD;  Location: [facility] CATH LAB;  Service: Cardiovascular;  Laterality: Left;  . LEAD REVISION N/A 06/17/2013   Procedure: LEAD  REVISION;  Surgeon: Deboraha Sprang, MD;  Location[facility] C[facility] LAB;  Service: Cardiovascular;  Laterality: N/A;  . ROTATOR CUFF REPAIR Right 2002     Medications Prior to Admission: Prior to Admission medications   Medication Sig Start Date End Date Taking? Authorizing Provider  albuterol (PROVENTIL HFA;VENTOLIN HFA) 108 (90 Base) MCG/ACT inhaler Inhale 2 puffs into the lungs every 6 (six) hours as needed for wheezing or shortness of breath. 03/13/16   Yes Fayrene Helper, MD  ALPRAZolam Duanne Moron) 1 MG tablet Take 1 tablet (1 mg total) by mouth at bedtime. 03/09/18  Yes Fayrene Helper, MD  aspirin 81 MG tablet Take 1 tablet (81 mg total) by mouth daily. 09/26/13  Yes Deboraha Sprang, MD  atorvastatin (LIPITOR) 10 MG tablet Take 1 tablet (10 mg total) by mouth daily. 05/24/18  Yes Deboraha Sprang, MD  carvedilol (COREG) 12.5 MG tablet Take 1 tablet (12.5 mg total) by mouth 2 (two) times daily. 12/21/17  Yes Bensimhon, Shaune Pascal, MD  cholecalciferol (VITAMIN D) 1000 units tablet Take 1,000 Units by mouth daily.   Yes [provider]  clopidogrel (PLAVIX) 75 MG tablet Take 1 tablet (75 mg total) by mouth daily. 11/04/17  Yes Fayrene Helper, MD  cyclobenzaprine (FLEXERIL) 5 MG tablet Take 1 tablet (5 mg total) by mouth at bedtime. 03/09/18  Yes Fayrene Helper, MD  digoxin (LANOXIN) 0.125 MG tablet Take 1 tablet (0.125 mg total) by mouth every other day. 05/27/18  Yes Deboraha Sprang, MD  fenofibrate (TRICOR) 145 MG tablet Take 1 tablet (145 mg total) by mouth daily. 05/24/18  Yes Deboraha Sprang, MD  furosemide (LASIX) 20 MG tablet Take 20 mg by mouth daily.   Yes [provider]  oxyCODONE (ROXICODONE) 15 MG immediate release tablet Take 15 mg by mouth every 8 (eight) hours.    Yes [provider]  sacubitril-valsartan (ENTRESTO) 24-26 MG Take 1 tablet by mouth 2 (two) times daily. 05/24/18  Yes Deboraha Sprang, MD     Allergies:    Allergies  Allergen Reactions  . Amiodarone     Headache and facial swelling  . Bee Venom Anaphylaxis  . Mexiletine   . Cortisone Swelling  . Robaxin [Methocarbamol] Hives and Itching    Social History:   Social History   Socioeconomic History  . Marital status: Single    Spouse name: Not on file  . Number of children: 3  . Years of education: Not on file  . Highest education level: Not on file  Occupational History  . Occupation: disabled    Fish farm manager: UNEMPLOYED    Social Needs  . Financial resource strain: Not hard at all  . Food insecurity:    Worry: Sometimes true    Inability: Sometimes true  . Transportation needs:    Medical: No    Non-medical: No  Tobacco Use  . Smoking status: Current Some Day Smoker    Packs/day: 0.50    Years: 15.00    Pack years: 7.50    Types: Cigarettes  . Smokeless tobacco: Never Used  Substance and Sexual Activity  . Alcohol use: Yes    Alcohol/week: 0.0 standard drinks    Comment: occasionaly  . Drug use: No  . Sexual activity: Not Currently    Birth control/protection: Surgical  Lifestyle  . Physical activity:    Days per week: 0 days    Minutes per session: 0 min  . Stress: Not at all  Relationships  . Social connections:    Talks on phone: More than three times a week    Gets together: Once a week    Attends religious service: Never    Active member of club or organization: No    Attends meetings of clubs or organizations: Never    Relationship status: Never married  . Intimate partner violence:    Fear of current or ex partner: No    Emotionally abused: No    Physically abused: No    Forced sexual activity: No  Other Topics Concern  . Not on file  Social History Narrative  . Not on file    Family History:   The patient's family history includes Cancer in her mother; Dementia in her father; Diabetes in her brother, mother, and sister; HIV in her sister; Heart disease in her brother and mother; Hepatitis (age of onset: 57) in her mother; Hyperlipidemia in her brother, mother, and sister; Liver cancer (age of onset: 3) in her mother; Stroke (age of onset: 8) in her sister.    Review of Systems: [y] = yes, [ ]  = no     General: Weight gain Blue.Reese ]; Weight loss [ ] ; Anorexia [ ] ; Fatigue [ ] ; Fever [ ] ; Chills [ ] ; Weakness [ ]    Cardiac: Chest pain/pressure [ ] ; Resting SOB [ ] ; Exertional SOB [ ] ; Orthopnea [ ] ; Pedal Edema [y]; Palpitations [ ] ; Syncope [ ] ; Presyncope [ ] ; Paroxysmal  nocturnal dyspnea[ ]    Pulmonary: Cough [y]; Wheezing[ ] ; Hemoptysis[ ] ; Sputum [ ] ; Snoring [ ]    GI: Vomiting[ ] ; Dysphagia[ ] ; Melena[ ] ; Hematochezia [ ] ; Heartburn[ ] ; Abdominal pain [ ] ; Constipation [ ] ; Diarrhea [ ] ; BRBPR [ ]    GU: Hematuria[ ] ; Dysuria [ ] ; Nocturia[ ]    Vascular: Pain in legs with walking [ ] ; Pain in feet with lying flat [ ] ; Non-healing sores [ ] ; Stroke [ ] ; TIA [ ] ; Slurred speech [ ] ;   Neuro: Headaches[ ] ; Vertigo[ ] ; Seizures[ ] ; Paresthesias[ ] ;Blurred vision [ ] ; Diplopia [ ] ; Vision changes [ ]    Ortho/Skin: Arthritis [ ] ; Joint pain [ ] ; Muscle pain [ ] ; Joint swelling [ ] ; Back Pain [ ] ; Rash [ ]    Psych: Depression[ ] ; Anxiety[ ]    Heme: Bleeding problems [ ] ; Clotting disorders [ ] ; Anemia [ ]    Endocrine: Diabetes [ ] ; Thyroid dysfunction[ ]   Physical Exam/Data:   Vitals:   06/22/18 2100 06/22/18 2115 06/22/18 2233 06/23/18 0025  BP: (!) 110/57  102/78 109/63  Pulse: 80 (!) 29 84 (!) 106  Resp: 13 16  (!) 23  Temp:   98.2 F (36.8 C) 98.6 F (37 C)  TempSrc:   Oral Oral  SpO2: 97% 95% 98% 96%  Weight:   95.3 kg   Height:   5\' 6"  (1.676 m)     Intake/Output Summary (Last 24 hours) at 06/23/2018 0100 Last data filed at 06/22/2018 2012 Gross per 24 hour  Intake 1000 ml  Output -  Net 1000 ml   Filed Weights   06/22/18 1454 06/22/18 2233  Weight: 94.3 kg 95.3 kg   Body mass index is 33.89 kg/m.  General:  Well nourished, well developed, in no acute distress. Sitting at the edge of the bed.  HEENT: normal Lymph: no adenopathy Neck: JVD at 64mmHg.  Endocrine:  No thryomegaly Vascular: No carotid bruits; FA pulses 2+ bilaterally without bruits  Cardiac:  normal S1, S2;  RRR; no murmurs Lungs:  clear to auscultation bilaterally, no wheezing, rhonchi or rales  Abd: soft, nontender, no hepatomegaly  Ext: wwp. No edema.  Musculoskeletal:  No deformities, BUE and BLE strength normal and equal Skin: warm and dry  Neuro:  CNs 2-12  intact, no focal abnormalities noted Psych:  Normal affect   EKG:  The ECG that was done  was personally reviewed and demonstrates sinus rhythm with frequent ventricular ectopy.   Relevant CV Studies: LHC 2004 EF 29% Left main has a distal 30% stenosis. Left anterior descending artery has an ostial 30% stenosis.  Left circumflex gives rise to a very large branching ramus intermedius, two small marginal branches, and two small posterolateral branches. The ramus intermedius gives off two branches. The more medial branch has a 95% stenosis at its ostium. However, this is a small caliber branch being less than or equal to 1.5 mm in diameter. The circumflex otherwise has minor luminal irregularities, but no significant stenoses. Right coronary artery has a 20% stenosis in the mid vessel. Just distal to this there is a stent in the distal portion of the mid vessel which is widely patent with 0% stenosis within the stent.   Myoview 06/30/17: EF 13% There is a large defect of severe severity present in the basal anteroseptal, basal inferoseptal, basal inferior, mid anteroseptal, mid inferoseptal, mid inferior and apical inferior location.  CPX 08/04/17 Pre-Exercise PFTs  FVC 2.21 (71%)    FEV1 1.78 (72%)     FEV1/FVC 81 (100%)     MVV 69 (69%)   Exercise Time:  6:15  Speed (mph): 2.0    Grade (%): 7.0   RPE: 16 Reason stopped: patient ended test due to being lightheaded (9/10) Additional symptoms: dyspnea (7/10)  Resting HR: 79 Peak HR: 94  (54% age predicted max HR) BP rest: 86/62 BP peak: 114/70 Peak VO2: 10.0 (50% predicted peak VO2) VE/VCO2 slope: 46 OUES: 1.04 Peak RER: 0.91 Ventilatory Threshold: 9.4 (47% predicted or measured peak VO2) Peak RR 44 Peak Ventilation: 38.3 VE/MVV: 56% PETCO2 at peak: 26 O2pulse: 10  (91% predicted O2pulse)  TTE 03/2018: - Left ventricle: The cavity size was severely dilated. Wall   thickness was  normal. Systolic function was severely reduced. The   estimated ejection fraction was in the range of 15% to 20%.   Diffuse hypokinesis. Features are consistent with a pseudonormal   left ventricular filling pattern, with concomitant abnormal   relaxation and increased filling pressure (grade 2 diastolic   dysfunction). - Mitral valve: There was moderate regurgitation. - Left atrium: The atrium was severely dilated. - Right ventricle: Systolic function was moderately reduced.  Device Interrogation 06/2018: Significant burden of NSVT; last monitored episode 11/2 but most days with > 10 episodes monitored.  Optivol with increasing fluid burden.   Laboratory Data:  Chemistry Recent Labs  Lab 06/22/18 1526  NA 139  K 3.3*  CL 109  CO2 24  GLUCOSE 109*  BUN 8  CREATININE 0.77  CALCIUM 8.7*  GFRNONAA >60  GFRAA >60  ANIONGAP 6    Recent Labs  Lab 06/22/18 1526  PROT 7.4  ALBUMIN 3.6  AST 19  ALT 13  ALKPHOS 57  BILITOT 0.8   Hematology Recent Labs  Lab 06/22/18 1526  WBC 5.4  RBC 4.79  HGB 12.6  HCT 40.4  MCV 84.3  MCH 26.3  MCHC 31.2  RDW 13.6  PLT 264   Cardiac Enzymes Recent Labs  Lab 06/22/18 1526  TROPONINI <0.03   No results for input(s): TROPIPOC in the last 168 hours.  BNP Recent Labs  Lab 06/22/18 1526  BNP 615.0*    DDimer No results for input(s): DDIMER in the last 168 hours.  Radiology/Studies:  Dg Chest 2 View  Result Date: 06/22/2018 CLINICAL DATA:  Cough EXAM: CHEST - 2 VIEW COMPARISON:  06/02/2018 FINDINGS: Left-sided pacing device as before. Cardiomegaly. No focal opacity or pleural effusion. No pneumothorax. IMPRESSION: No active cardiopulmonary disease.  Cardiomegaly. Electronically Signed   By: Donavan Foil M.D.   On: 06/22/2018 15:55    Assessment and Plan:   1. Acute on Chronic Decompensated Systolic Heart Failure 2. CAD s/p PCI Based upon her exam, Optivol results, and ongoing cough I suspect that she has some extra  volume on board and would hope that her cough and ectopy may improve with gentle diuresis. Will plan to replete potassium (given NSVT burden) and magesium, start IV diuresis, and reevaluate symptoms.  -- CMP tonight; replete K > 4, Mg > 2 -- Lasix 20mg  IV x 1 to be dosed tonight once electrolytes repleted. Goal net negative ~ 1L.  -- Continue home GDMT with carvedilol 12.5 BID, sacubitril-valsartan 24-26 -- Digoxin 0.125 qOD; will check digoxin level -- Could consider cautious addition of spironolactone given prior admission with hyperkalemia -- Continue DAPT.   3. NSVT Patient with impressive burden of NSVT. Noted intolerance to amiodarone and mexilitene in the past. Device interrogation with no recent VT/VF requiring treatment but > 100 episodes in the monitored zone, most recently 11/2. If burden of ectopy continues despite reintroduction of beta blocker and electrolyte repletions may need to consider EP consult for possible ablation +/- ischemia work up.  -- Telemetry monitoring -- BID BMP with aggressive repletions -- Consider EP consult  4. Ongoing cough ?URI -- Diuresis as above -- Tessilon perles PRN -- No role for abx at this time given lack of leukocytosis, left shift, CXR findings.   Severity of Illness: The appropriate patient status for this patient is INPATIENT. Inpatient status is judged to be reasonable and necessary in order to provide the required intensity of service to ensure the patient's safety. The patient's presenting symptoms, physical exam findings, and initial radiographic and laboratory data in the context of their chronic comorbidities is felt to place them at high risk for further clinical deterioration. Furthermore, it is not anticipated that the patient will be medically stable for discharge from the hospital within 2 midnights of admission. The following factors support the patient status of inpatient.   " The patient's presenting symptoms include hypotension,  NSVT. " The worrisome physical exam findings include cough, elevated JVD.  " The initial radiographic and laboratory data are worrisome because of NSVT.  " The chronic co-morbidities include CHF   * I certify that at the point of admission it is my clinical judgment that the patient will require inpatient hospital care spanning beyond 2 midnights from the point of admission due to high intensity of service, high risk for further deterioration and high frequency of surveillance required.*    For questions or updates, please contact [facility] Please consult [email or web address] for contact info under      Signed, Milus Banister, MD  06/23/2018 1:00 AM

## 2018-06-23 NOTE — Progress Notes (Addendum)
Advanced Heart Failure Rounding Note  PCP-Cardiologist: No primary care provider on file.   Subjective:    BP improved to 110-130s overnight.  98/68 on my personal, manual check.   Feeling at her baseline. Her only complaint is a sore throat and associated dysphagia. She denies puffiness, or stiffness in her hands. No orthopnea. No lightheadedness or dizziness despite relative hypotension.   Cr stable. K 3.7. No UOP recorded. No weight this am.   Objective:   Weight Range: 95.3 kg Body mass index is 33.89 kg/m.   Vital Signs:   Temp:  [97.6 F (36.4 C)-98.6 F (37 C)] 98.2 F (36.8 C) (11/06 0614) Pulse Rate:  [26-106] 96 (11/06 0614) Resp:  [13-26] 18 (11/06 0614) BP: (87-131)/(52-115) 131/115 (11/06 0614) SpO2:  [95 %-100 %] 96 % (11/06 0614) Weight:  [94.3 kg-95.3 kg] 95.3 kg (11/05 2233)    Weight change: Filed Weights   06/22/18 1454 06/22/18 2233  Weight: 94.3 kg 95.3 kg    Intake/Output:   Intake/Output Summary (Last 24 hours) at 06/23/2018 0821 Last data filed at 06/23/2018 0200 Gross per 24 hour  Intake 1334.46 ml  Output -  Net 1334.46 ml      Physical Exam    General:  Well appearing. No resp difficulty HEENT: Normal Neck: Supple. JVP 6-7 cm. Carotids 2+ bilat; no bruits. No lymphadenopathy or thyromegaly appreciated. Cor: PMI nondisplaced. Very frequent ectopy. No rubs, gallops or murmurs. Lungs: Clear Abdomen: Soft, nontender, nondistended. No hepatosplenomegaly. No bruits or masses. Good bowel sounds. Extremities: No cyanosis, clubbing, rash, or edema Neuro: Alert & orientedx3, cranial nerves grossly intact. moves all 4 extremities w/o difficulty. Affect pleasant  Telemetry   NSR 80s, very frequent PVCs and NSVT, ranging from 20-40 PVCs an hour, personally reviewed.   EKG    NSR 78 bpm with frequent PVCs, personally reviewed.   Labs    CBC Recent Labs    06/22/18 1526  WBC 5.4  NEUTROABS 2.4  HGB 12.6  HCT 40.4  MCV 84.3    PLT 242   Basic Metabolic Panel Recent Labs    06/22/18 1526 06/22/18 2350 06/23/18 0402  NA 139 141 141  K 3.3* 3.8 3.7  CL 109 112* 112*  CO2 24 22 27   GLUCOSE 109* 122* 150*  BUN 8 6 7   CREATININE 0.77 0.79 0.84  CALCIUM 8.7* 8.7* 8.7*  MG 1.9  --   --    Liver Function Tests Recent Labs    06/22/18 1526 06/22/18 2350  AST 19 19  ALT 13 11  ALKPHOS 57 55  BILITOT 0.8 0.4  PROT 7.4 6.4*  ALBUMIN 3.6 3.1*   No results for input(s): LIPASE, AMYLASE in the last 72 hours. Cardiac Enzymes Recent Labs    06/22/18 1526  TROPONINI <0.03    BNP: BNP (last 3 results) Recent Labs    12/25/17 0017 06/22/18 1526  BNP 113.0* 615.0*    ProBNP (last 3 results) No results for input(s): PROBNP in the last 8760 hours.   D-Dimer No results for input(s): DDIMER in the last 72 hours. Hemoglobin A1C Recent Labs    06/22/18 1202  HGBA1C 6.7*   Fasting Lipid Panel Recent Labs    06/22/18 1202  CHOL 91  HDL 23*  LDLCALC 50  TRIG 94  CHOLHDL 4.0   Thyroid Function Tests Recent Labs    06/22/18 2350  TSH 1.762    Other results:   Imaging    Dg  Chest 2 View  Result Date: 06/22/2018 CLINICAL DATA:  Cough EXAM: CHEST - 2 VIEW COMPARISON:  06/02/2018 FINDINGS: Left-sided pacing device as before. Cardiomegaly. No focal opacity or pleural effusion. No pneumothorax. IMPRESSION: No active cardiopulmonary disease.  Cardiomegaly. Electronically Signed   By: Donavan Foil M.D.   On: 06/22/2018 15:55      Medications:     Scheduled Medications: . ALPRAZolam  1 mg Oral QHS  . aspirin EC  81 mg Oral Daily  . atorvastatin  10 mg Oral Daily  . carvedilol  12.5 mg Oral BID WC  . cholecalciferol  1,000 Units Oral Daily  . clopidogrel  75 mg Oral Daily  . digoxin  0.125 mg Oral QODAY  . fenofibrate  54 mg Oral Daily  . furosemide  20 mg Oral Daily  . heparin  5,000 Units Subcutaneous Q8H  . potassium chloride  20 mEq Oral Daily  . sacubitril-valsartan  1  tablet Oral BID  . sodium chloride flush  3 mL Intravenous Q12H     Infusions: . sodium chloride       PRN Medications:  sodium chloride, acetaminophen, albuterol, benzonatate, sodium chloride flush    Patient Profile   [name] is a 52 y.o. female with CAD s/p PCI to RCA 2011, mixed ICM/NICM NYHA II, ACC B (EF 20%, s/p ICD) c/b NSVT, SLE, GERD, and ongoing tobacco use who is admitted with hypotension and cough.   Assessment/Plan   1. Hypotension - Improved. Home meds continued - Her baseline pressures run in 90-100s.  2. Acute on chronic systolic CHF - Echo 7/67/34 LVEF 15-20%, Grade 2 DD, Mod MR, Severe LAE, Mod RV reduction - Volume status stable on exam.  - Continue coreg 12.5 mg BID - Continue Entrest 24/26 mg BID - Continue digoxin 0.125 mg daily. Level stable 06/22/18  3. CAD - s/p remote stent. Last cath 2004. - No s/s of ischemia.    - Myoview 11/18 EF 13%, no obvious ischemia - Continue ASA and statin  4. NSVT/VT - Managed by Dr. Caryl Comes. S/p MDT ICD.  - Intolerant to amiodarone and mexiletine - s/p ICD shock 12/2017 in setting of electrolytes. Can drive again this month.  - No VT on ICD interrogation.  - ZIO patch 8-04/2018 with predominantly sinus rhythm with > 7900 runs of NSVT/VT, longest 30 seconds.  5. Cough/Sore throat - No fevers/chills or production. Suspect viral. Recommended symptomatic treatment with saltwater gurgle, chloraseptic spray, and tylenol. Follow up with PCP as needed.   6. Tobacco abuse - Encouraged complete cessation.   She is feeling at her baseline today. Labwork unremarkable and frequent ectopy is chronic. OK for home. Will make close follow up in HF clinic.   Medication concerns reviewed with patient and pharmacy team. Barriers identified: None at this time.   Length of Stay: 0  Annamaria Helling  06/23/2018, 8:21 AM  Advanced Heart Failure Team Pager [phone] (M-F; 7a - 4p)  Please contact [facility] for night-coverage after hours (4p -7a ) and weekends on [email or web address]  Patient seen and examined with the above-signed Advanced Practice Provider and/or Housestaff. I personally reviewed laboratory data, imaging studies and relevant notes. I independently examined the patient and formulated the important aspects of the plan. I have edited the note to reflect any of my changes or salient points. I have personally discussed the plan with the patient and/or family.  Overall seems to be at baseline. BP and HF improved. Very  eager to go home. Ok for d/c to today. NSVT managed by Dr. Caryl Comes.   Glori Bickers, MD  9:52 AM   Patient seen and examined with the above-signed Advanced Practice Provider and/or Housestaff. I personally reviewed laboratory data, imaging studies and relevant notes. I independently examined the patient and formulated the important aspects of the plan. I have edited the note to reflect any of my changes or salient points. I have personally discussed the plan with the patient and/or family.  Overall seems to be at baseline. BP and HF improved. Very eager to go home. Ok for d/c to today. NSVT managed by Dr. Caryl Comes.   Glori Bickers, MD  9:54 AM

## 2018-06-23 NOTE — Progress Notes (Signed)
Pt HR 40's, V pacing. Pt sleeping, awakened. Pt asymptomatic and no longer having CP. Cont to monitor. Carroll Kinds RN

## 2018-06-23 NOTE — Progress Notes (Signed)
Pt began having 10/10 CP, burning. Pt writhing in bed. Pt placed back on tele (pt was being discharged). Pt SR on tele. Called Oda Kilts PA to see pt. Cont to monitor. Carroll Kinds RN

## 2018-06-23 NOTE — Progress Notes (Addendum)
  Discharge orders had been placed. IV removed.   While preparing to go home, pt began to have 10/10 chest pain. Describes as a severe pressure radiating into both arms with numbness/tingling in both hands. Diaphoretic on exam and writhing in pain.   BP 102 palpated.  NTG given with h/o CAD, no improvement. Pt states pain is unlike anything she has ever experienced.   Troponin ordered. With acute onset and presentation, ordered for stat Chest Angio with concerns for dissection.   Discussed all above with Dr. Haroldine Laws.   Legrand Como [address] [place], PA-C 06/23/2018 11:04 AM   Agree with above.   Developed severe CP and diaphoresis with coughing prior to d/c. Stat CT for negative for dissection. ECG and Troponin negative. May be musculoskeletal with pain on palpation of chest wall. If pain continues may need cardiac cath.   Glori Bickers, MD  2:27 PM

## 2018-06-23 NOTE — Progress Notes (Signed)
Pt still c/o of 10/10 CP, morphine given with no relief. Dilaudid order. Cont to monitor. Carroll Kinds RN

## 2018-06-23 NOTE — Care Management Obs Status (Signed)
[facility] NOTIFICATION   Patient Details  Name: [name] MRN: [id number] Date of Birth: 1965-09-04   Medicare Observation Status Notification Given:  Yes    Bethena Roys, RN 06/23/2018, 4:39 PM

## 2018-06-23 NOTE — Discharge Summary (Addendum)
Error

## 2018-06-23 NOTE — Progress Notes (Signed)
CRITICAL VALUE ALERT  Critical Value:  Trop 0.10  Date & Time Notied:  06/23/18 1738  Provider Notified: Oda Kilts PA  Orders Received/Actions taken: Add d-dimer, cont to follow trops

## 2018-06-24 ENCOUNTER — Encounter (HOSPITAL_COMMUNITY)
Admission: EM | Disposition: A | Discharge status: Home / Self Care | Payer: Self-pay | Source: Home / Self Care | Attending: Internal Medicine

## 2018-06-24 DIAGNOSIS — I214 Non-ST elevation (NSTEMI) myocardial infarction: Principal | ICD-10-CM

## 2018-06-24 HISTORY — PX: LEFT HEART CATH AND CORONARY ANGIOGRAPHY: CATH118249

## 2018-06-24 LAB — CUP PACEART INCLINIC DEVICE CHECK
Battery Remaining Longevity: 84 mo
Battery Voltage: 2.99 V
Brady Statistic RV Percent Paced: 0.01 %
HIGH POWER IMPEDANCE MEASURED VALUE: 78 Ohm
Implantable Lead Location: 753860
Lead Channel Impedance Value: 456 Ohm
Lead Channel Impedance Value: 551 Ohm
Lead Channel Setting Pacing Pulse Width: 0.4 ms
Lead Channel Setting Sensing Sensitivity: 0.3 mV
MDC IDC LEAD IMPLANT DT: 20141031
MDC IDC MSMT LEADCHNL RV PACING THRESHOLD AMPLITUDE: 0.75 V
MDC IDC MSMT LEADCHNL RV PACING THRESHOLD PULSEWIDTH: 0.4 ms
MDC IDC MSMT LEADCHNL RV SENSING INTR AMPL: 6.125 mV
MDC IDC PG IMPLANT DT: 20141031
MDC IDC SESS DTM: 20191007190451
MDC IDC SET LEADCHNL RV PACING AMPLITUDE: 2.5 V

## 2018-06-24 LAB — BASIC METABOLIC PANEL
ANION GAP: 8 (ref 5–15)
BUN: 6 mg/dL (ref 6–20)
CALCIUM: 8.9 mg/dL (ref 8.9–10.3)
CHLORIDE: 108 mmol/L (ref 98–111)
CO2: 24 mmol/L (ref 22–32)
Creatinine, Ser: 0.69 mg/dL (ref 0.44–1.00)
GFR calc Af Amer: >60 mL/min (ref >60)
GFR calc non Af Amer: >60 mL/min (ref >60)
GLUCOSE: 98 mg/dL (ref 70–99)
Potassium: 4.2 mmol/L (ref 3.5–5.1)
Sodium: 140 mmol/L (ref 135–145)

## 2018-06-24 LAB — MAGNESIUM: Magnesium: 2.2 mg/dL (ref 1.7–2.4)

## 2018-06-24 LAB — D-DIMER, QUANTITATIVE: D-Dimer, Quant: 0.39 ug/mL-FEU (ref 0.00–0.50)

## 2018-06-24 SURGERY — LEFT HEART CATH AND CORONARY ANGIOGRAPHY
Anesthesia: LOCAL

## 2018-06-24 MED ORDER — SODIUM CHLORIDE 0.9 % IV SOLN: 250.0000 mL | INTRAVENOUS | Status: DC | PRN

## 2018-06-24 MED ORDER — MIDAZOLAM HCL 2 MG/2ML IJ SOLN
INTRAMUSCULAR | Status: AC
  Filled 2018-06-24: qty 2

## 2018-06-24 MED ORDER — SODIUM CHLORIDE 0.9% FLUSH: 3.0000 mL | INTRAVENOUS | Status: DC | PRN

## 2018-06-24 MED ORDER — HEPARIN SODIUM (PORCINE) 1000 UNIT/ML IJ SOLN
INTRAMUSCULAR | Status: DC | PRN
  Administered 2018-06-24: 4500 [IU] via INTRAVENOUS

## 2018-06-24 MED ORDER — SODIUM CHLORIDE 0.9 % IV SOLN
INTRAVENOUS | Status: AC
  Administered 2018-06-24: 17:00:00 via INTRAVENOUS

## 2018-06-24 MED ORDER — SODIUM CHLORIDE 0.9 % IV SOLN: INTRAVENOUS | Status: DC

## 2018-06-24 MED ORDER — IOHEXOL 350 MG/ML SOLN
INTRAVENOUS | Status: DC | PRN
  Administered 2018-06-24: 90 mL via INTRA_ARTERIAL

## 2018-06-24 MED ORDER — VERAPAMIL HCL 2.5 MG/ML IV SOLN
INTRAVENOUS | Status: DC | PRN
  Administered 2018-06-24: 10 mL via INTRA_ARTERIAL

## 2018-06-24 MED ORDER — FENTANYL CITRATE (PF) 100 MCG/2ML IJ SOLN
INTRAMUSCULAR | Status: DC | PRN
  Administered 2018-06-24: 25 ug via INTRAVENOUS

## 2018-06-24 MED ORDER — HEPARIN (PORCINE) 25000 UT/250ML-% IV SOLN
900.0000 [IU]/h | INTRAVENOUS | Status: DC
  Administered 2018-06-24: 900 [IU]/h via INTRAVENOUS
  Filled 2018-06-24: qty 250

## 2018-06-24 MED ORDER — MIDAZOLAM HCL 2 MG/2ML IJ SOLN
INTRAMUSCULAR | Status: DC | PRN
  Administered 2018-06-24: 1 mg via INTRAVENOUS

## 2018-06-24 MED ORDER — VERAPAMIL HCL 2.5 MG/ML IV SOLN
INTRAVENOUS | Status: AC
  Filled 2018-06-24: qty 2

## 2018-06-24 MED ORDER — HEPARIN (PORCINE) 25000 UT/250ML-% IV SOLN
1100.0000 [IU]/h | INTRAVENOUS | Status: DC
  Administered 2018-06-25: 900 [IU]/h via INTRAVENOUS
  Administered 2018-06-25: 1100 [IU]/h via INTRAVENOUS
  Filled 2018-06-24: qty 250

## 2018-06-24 MED ORDER — SODIUM CHLORIDE 0.9 % IV SOLN
250.0000 mL | INTRAVENOUS | Status: DC | PRN
  Administered 2018-06-24: 250 mL via INTRAVENOUS

## 2018-06-24 MED ORDER — ACETAMINOPHEN 325 MG PO TABS: 650.0000 mg | ORAL_TABLET | ORAL | Status: DC | PRN

## 2018-06-24 MED ORDER — FENTANYL CITRATE (PF) 100 MCG/2ML IJ SOLN
INTRAMUSCULAR | Status: AC
  Filled 2018-06-24: qty 2

## 2018-06-24 MED ORDER — SODIUM CHLORIDE 0.9% FLUSH: 3.0000 mL | Freq: Two times a day (BID) | INTRAVENOUS | Status: DC

## 2018-06-24 MED ORDER — LIDOCAINE HCL (PF) 1 % IJ SOLN
INTRAMUSCULAR | Status: AC
  Filled 2018-06-24: qty 30

## 2018-06-24 MED ORDER — LIDOCAINE HCL (PF) 1 % IJ SOLN
INTRAMUSCULAR | Status: DC | PRN
  Administered 2018-06-24: 2 mL

## 2018-06-24 MED ORDER — HEPARIN BOLUS VIA INFUSION
4000.0000 [IU] | Freq: Once | INTRAVENOUS | Status: AC
  Administered 2018-06-24: 4000 [IU] via INTRAVENOUS
  Filled 2018-06-24: qty 4000

## 2018-06-24 MED ORDER — ONDANSETRON HCL 4 MG/2ML IJ SOLN: 4.0000 mg | Freq: Four times a day (QID) | INTRAMUSCULAR | Status: DC | PRN

## 2018-06-24 MED ORDER — HEPARIN (PORCINE) IN NACL 1000-0.9 UT/500ML-% IV SOLN
INTRAVENOUS | Status: AC
  Filled 2018-06-24: qty 1000

## 2018-06-24 MED ORDER — GUAIFENESIN-DM 100-10 MG/5ML PO SYRP
15.0000 mL | ORAL_SOLUTION | ORAL | Status: DC | PRN
  Administered 2018-06-24 – 2018-06-25 (×2): 15 mL via ORAL
  Filled 2018-06-24 (×2): qty 15

## 2018-06-24 MED ORDER — HEPARIN (PORCINE) IN NACL 1000-0.9 UT/500ML-% IV SOLN
INTRAVENOUS | Status: DC | PRN
  Administered 2018-06-24 (×2): 500 mL

## 2018-06-24 MED ORDER — SODIUM CHLORIDE 0.9% FLUSH
3.0000 mL | Freq: Two times a day (BID) | INTRAVENOUS | Status: DC
  Administered 2018-06-25 – 2018-06-29 (×9): 3 mL via INTRAVENOUS

## 2018-06-24 SURGICAL SUPPLY — 10 items
CATH 5FR JL3.5 JR4 ANG PIG MP (CATHETERS) ×1 IMPLANT
DEVICE RAD COMP TR BAND LRG (VASCULAR PRODUCTS) ×1 IMPLANT
GLIDESHEATH SLEND SS 6F .021 (SHEATH) ×1 IMPLANT
GUIDEWIRE INQWIRE 1.5J.035X260 (WIRE) IMPLANT
INQWIRE 1.5J .035X260CM (WIRE) ×2
KIT HEART LEFT (KITS) ×2 IMPLANT
PACK CARDIAC CATHETERIZATION (CUSTOM PROCEDURE TRAY) ×2 IMPLANT
SHEATH PROBE COVER 6X72 (BAG) ×1 IMPLANT
TRANSDUCER W/STOPCOCK (MISCELLANEOUS) ×2 IMPLANT
TUBING CIL FLEX 10 FLL-RA (TUBING) ×2 IMPLANT

## 2018-06-24 NOTE — Progress Notes (Signed)
ANTICOAGULATION CONSULT NOTE - [place] for Heparin Indication: chest pain/ACS  Allergies  Allergen Reactions  . Amiodarone     Headache and facial swelling  . Bee Venom Anaphylaxis  . Mexiletine   . Cortisone Swelling  . Robaxin [Methocarbamol] Hives and Itching    Patient Measurements: Height: 5\' 6"  (167.6 cm) Weight: 207 lb 11.2 oz (94.2 kg) IBW/kg (Calculated) : 59.3 HEPARIN DW (KG): 80.5   Vital Signs: Temp: 98.1 F (36.7 C) (11/07 1405) Temp Source: Oral (11/07 1405) BP: 104/75 (11/07 1632) Pulse Rate: 74 (11/07 1632)  Labs: Recent Labs    06/22/18 1526 06/22/18 2350 06/23/18 0402 06/23/18 1103 06/23/18 1557 06/24/18 0418 06/24/18 0902  HGB 12.6  --   --   --   --   --   --   HCT 40.4  --   --   --   --   --   --   PLT 264  --   --   --   --   --   --   LABPROT 13.4  --   --   --   --   --   --   INR 1.03  --   --   --   --   --   --   CREATININE 0.77 0.79 0.84  --   --  0.69  --   TROPONINI <0.03  --   --  <0.03 0.10*  --  30.28*    Estimated Creatinine Clearance: 93 mL/min (by C-G formula based on SCr of 0.69 mg/dL).   Medical History: Past Medical History:  Diagnosis Date  . Arthritis of knee   . Automatic implantable cardiac defibrillator in situ    a. s/p prior [employer] ICD with 6949 lead. b. s/p Gen change & lead revision 05/2013.  . Cardiomyopathy secondary    Patient has cardiomyopathy out of proportion to her ischemic heart disease  . Chronic pain   . coronary artery disease    RCA stenting Myoview 2011 EF 30% infarction dilatation without ischemia  . Depression   . Diabetes mellitus   . GERD (gastroesophageal reflux disease)   . Insomnia   . Lupus (systemic lupus erythematosus) ([place])   . Membranous glomerulonephritis   . Migraines   . Myocardial infarction ([place])    8 total   . Nicotine abuse   . Other and unspecified hyperlipidemia   . Paroxysmal VT ([place])   . Systolic CHF ([place])   . VF (ventricular  fibrillation) ([place])    a. Hx appropriate ICD therapy for VF.    Medications:  Scheduled:  . ALPRAZolam  1 mg Oral QHS  . aspirin EC  81 mg Oral Daily  . atorvastatin  10 mg Oral Daily  . carvedilol  12.5 mg Oral BID WC  . cholecalciferol  1,000 Units Oral Daily  . clopidogrel  75 mg Oral Daily  . digoxin  0.125 mg Oral QODAY  . fenofibrate  54 mg Oral Daily  . furosemide  20 mg Oral Daily  .  morphine injection  2 mg Intravenous Once  . potassium chloride  20 mEq Oral Daily  . sacubitril-valsartan  1 tablet Oral BID  . sodium chloride flush  3 mL Intravenous Q12H  . sodium chloride flush  3 mL Intravenous Q12H   Infusions:  . sodium chloride    . sodium chloride 50 mL/hr at 06/24/18 1702  . sodium chloride    . heparin  900 Units/hr (06/24/18 1028)    Assessment: 67 yoF who developed CP and was started on IV heparin now s/p LHC which demonstrates in-stent lesion and significant LM disease. Pt to resume IV heparin in 8 hr while decision is made regarding revascularization. No heparin levels drawn on 900 units/hr so will resume at this rate.  Goal of Therapy:  Heparin level 0.3-0.7 units/ml Monitor platelets by anticoagulation protocol: Yes   Plan:  -Resume heparin 900 units/hr at 0030 with no bolus -Check 6hr heparin level -Follow-up longterm plans and length of heparin therapy  Arrie Senate, PharmD, BCPS Clinical Pharmacist [phone] Please check AMION for all [place] numbers 06/24/2018

## 2018-06-24 NOTE — Progress Notes (Signed)
Trop 30.28. Provider paged.  Pt on IV heparin. Denies CP. Pt waiting for cath.

## 2018-06-24 NOTE — Progress Notes (Signed)
Dr. Haroldine Laws stated to hold all evening doses of heart medications and lasix. Cont to monitor. Carroll Kinds RN

## 2018-06-24 NOTE — Progress Notes (Addendum)
Advanced Heart Failure Rounding Note  PCP-Cardiologist: No primary care provider on file.   Subjective:    Yesterday, after discharge orders placed, pt developed sudden onset severe chest pain. Unable to get comfortable. Was taken for stat Chest CT which was negative for dissection or PE. Pain relieved somewhat with 1 dose dilaudid and pt was able to rest.   Overnight pain well controlled. D-dimer negative. Troponin labs not done, but stat this am Trop 13.3 (confirmed with lab as this was re-sent and not entered due to it being discrepant from previous value). Starting on heparin and plan for LHC this afternoon.  Feeling OK this am. Chest pain mostly resolved. Mild headache. Breathing OK. Still having sore throat.   Objective:   Weight Range: 94.2 kg Body mass index is 33.52 kg/m.   Vital Signs:   Temp:  [97.7 F (36.5 C)-98.5 F (36.9 C)] 98.1 F (36.7 C) (11/07 0843) Pulse Rate:  [78-87] 83 (11/07 0843) Resp:  [16-24] 20 (11/07 0914) BP: (107-126)/(64-88) 109/74 (11/07 0914) SpO2:  [92 %-99 %] 93 % (11/07 0847) Weight:  [94.2 kg] 94.2 kg (11/07 0510) Last BM Date: 06/22/18(per patient)  Weight change: Filed Weights   06/22/18 1454 06/22/18 2233 06/24/18 0510  Weight: 94.3 kg 95.3 kg 94.2 kg    Intake/Output:   Intake/Output Summary (Last 24 hours) at 06/24/2018 0919 Last data filed at 06/23/2018 2246 Gross per 24 hour  Intake 360 ml  Output 0 ml  Net 360 ml      Physical Exam    General: Fatigued appearing. No resp difficulty. HEENT: Normal Neck: Supple. JVP 6-7 cm. Carotids 2+ bilat; no bruits. No thyromegaly or nodule noted. Cor: PMI nondisplaced. Frequent ectopy. No M/G/R noted Lungs: CTAB, normal effort. Abdomen: Soft, non-tender, non-distended, no HSM. No bruits or masses. +BS  Extremities: No cyanosis, clubbing, or rash. R and LLE no edema.  Neuro: Alert & orientedx3, cranial nerves grossly intact. moves all 4 extremities w/o difficulty. Affect  pleasant   Telemetry   NSR 70-80s with frequent PVCs, had episode of HR in 40s overnight, personally reviewed.   EKG    06/23/18 78 bpm with trigeminy, personally reviewed.    Labs    CBC Recent Labs    06/22/18 1526  WBC 5.4  NEUTROABS 2.4  HGB 12.6  HCT 40.4  MCV 84.3  PLT 664   Basic Metabolic Panel Recent Labs    06/22/18 1526  06/23/18 0402 06/24/18 0418  NA 139   < > 141 140  K 3.3*   < > 3.7 4.2  CL 109   < > 112* 108  CO2 24   < > 27 24  GLUCOSE 109*   < > 150* 98  BUN 8   < > 7 6  CREATININE 0.77   < > 0.84 0.69  CALCIUM 8.7*   < > 8.7* 8.9  MG 1.9  --   --  2.2   < > = values in this interval not displayed.   Liver Function Tests Recent Labs    06/22/18 1526 06/22/18 2350  AST 19 19  ALT 13 11  ALKPHOS 57 55  BILITOT 0.8 0.4  PROT 7.4 6.4*  ALBUMIN 3.6 3.1*   No results for input(s): LIPASE, AMYLASE in the last 72 hours. Cardiac Enzymes Recent Labs    06/22/18 1526 06/23/18 1103 06/23/18 1557  TROPONINI <0.03 <0.03 0.10*    BNP: BNP (last 3 results) Recent Labs  12/25/17 0017 06/22/18 1526  BNP 113.0* 615.0*    ProBNP (last 3 results) No results for input(s): PROBNP in the last 8760 hours.   D-Dimer Recent Labs    06/24/18 0418  DDIMER 0.39   Hemoglobin A1C Recent Labs    06/22/18 1202  HGBA1C 6.7*   Fasting Lipid Panel Recent Labs    06/22/18 1202  CHOL 91  HDL 23*  LDLCALC 50  TRIG 94  CHOLHDL 4.0   Thyroid Function Tests Recent Labs    06/22/18 2350  TSH 1.762    Other results:   Imaging    Ct Angio Chest Aorta W/cm &/or Wo/cm  Result Date: 06/23/2018 CLINICAL DATA:  Chest pain. EXAM: CT ANGIOGRAPHY CHEST WITH CONTRAST TECHNIQUE: Multidetector CT imaging of the chest was performed using the standard protocol during bolus administration of intravenous contrast. Multiplanar CT image reconstructions and MIPs were obtained to evaluate the vascular anatomy. CONTRAST:  153mL ISOVUE-370 IOPAMIDOL  (ISOVUE-370) INJECTION 76% COMPARISON:  Radiographs of June 22, 2018. CT scan of June 29, 2017. FINDINGS: Cardiovascular: There is no evidence of thoracic aortic aneurysm or dissection. Atherosclerotic plaque is noted in the proximal descending thoracic aorta. Mild cardiomegaly is noted. No pericardial effusion is noted. Coronary artery calcifications are noted. No definite evidence of pulmonary embolus is noted. Mediastinum/Nodes: No enlarged mediastinal, hilar, or axillary lymph nodes. Thyroid gland, trachea, and esophagus demonstrate no significant findings. Lungs/Pleura: No pneumothorax or pleural effusion is noted. 6 mm nodule is seen posteriorly in the left upper lobe best seen on image number 22 of series 8. No other pulmonary abnormality is noted. Upper Abdomen: No acute abnormality. Musculoskeletal: No chest wall abnormality. No acute or significant osseous findings. Review of the MIP images confirms the above findings. IMPRESSION: No evidence of thoracic aortic dissection or aneurysm is noted. Small atherosclerotic plaque is noted in the proximal descending thoracic aorta. Coronary artery calcifications are noted suggesting coronary artery disease. 6 mm nodule is noted in left upper lobe. Non-contrast chest CT at 6-12 months is recommended. If the nodule is stable at time of repeat CT, then future CT at 18-24 months (from today's scan) is considered optional for low-risk patients, but is recommended for high-risk patients. This recommendation follows the consensus statement: Guidelines for Management of Incidental Pulmonary Nodules Detected on CT Images: From the Fleischner Society 2017; Radiology 2017; 284:228-243. Aortic Atherosclerosis (ICD10-I70.0). Electronically Signed   By: Marijo Conception, M.D.   On: 06/23/2018 12:35     Medications:     Scheduled Medications: . ALPRAZolam  1 mg Oral QHS  . aspirin EC  81 mg Oral Daily  . atorvastatin  10 mg Oral Daily  . carvedilol  12.5 mg Oral  BID WC  . cholecalciferol  1,000 Units Oral Daily  . clopidogrel  75 mg Oral Daily  . digoxin  0.125 mg Oral QODAY  . fenofibrate  54 mg Oral Daily  . furosemide  20 mg Oral Daily  . heparin  4,000 Units Intravenous Once  .  morphine injection  2 mg Intravenous Once  . potassium chloride  20 mEq Oral Daily  . sacubitril-valsartan  1 tablet Oral BID  . sodium chloride flush  3 mL Intravenous Q12H  . sodium chloride flush  3 mL Intravenous Q12H    Infusions: . sodium chloride    . sodium chloride    . sodium chloride    . heparin      PRN Medications: sodium chloride, sodium chloride, acetaminophen,  albuterol, benzonatate, ondansetron (ZOFRAN) IV, sodium chloride flush, sodium chloride flush    Patient Profile   [name] is a 52 y.o. female with CAD s/p PCI to RCA 2011, mixed ICM/NICM NYHA II, ACC B (EF 20%, s/p ICD) c/b NSVT, SLE, GERD, and ongoing tobacco use who is admitted with hypotension and cough.   Assessment/Plan   1. Chest pain in CAD patient  - s/p remote stent. Last cath 2004. - stat chest CT 11/6 with no evidence of dissection. Negative for PE. D-dimer WNL.  - With troponin elevation, will plan for LHC with possible angiography this afternoon.  - Start heparin.  - Myoview 11/18 EF 13%, no obvious ischemia - Continue ASA and statin  2. Hypotension - Stable and near her baselines which run in 90-100s.  3. Acute on chronic systolic CHF - Echo 0/92/33 LVEF 15-20%, Grade 2 DD, Mod MR, Severe LAE, Mod RV reduction - Volume status OK on exam.  - Continue coreg 12.5 mg BID - Continue Entrest 24/26 mg BID - Continue digoxin 0.125 mg daily. Level stable 06/22/18  4. NSVT/VT - Managed by Dr. Caryl Comes. S/p MDT ICD.  - Intolerant to amiodarone and mexiletine - s/p ICD shock 12/2017 in setting of electrolytes. Can drive again this month.  - No VT.  - ZIO patch 8-04/2018 with predominantly sinus rhythm with > 7900 runs of NSVT/VT, longest 30 seconds.  5.  Cough/Sore throat - No fevers/chills or production. Suspect viral. Recommended symptomatic treatment with saltwater gurgle, chloraseptic spray, and tylenol. Follow up with PCP as needed. No change.   6. Tobacco abuse - Encouraged complete cessation. No change.   With acute chest pain 06/23/18 and up-trend in troponin, plan for LHC this afternoon.   Medication concerns reviewed with patient and pharmacy team. Barriers identified: None at this time.   Length of Stay: 0  Annamaria Helling  06/24/2018, 9:19 AM  Advanced Heart Failure Team Pager [phone] (M-F; 7a - 4p)  Please contact [facility] for night-coverage after hours (4p -7a ) and weekends on [email or web address]    Patient seen and examined with the above-signed Advanced Practice Provider and/or Housestaff. I personally reviewed laboratory data, imaging studies and relevant notes. I independently examined the patient and formulated the important aspects of the plan. I have edited the note to reflect any of my changes or salient points. I have personally discussed the plan with the patient and/or family.  CP resolved. But troponin has now bumped to 30. ECG unchanged. Heparin and ASA started. Discussed risks/indications for cath and she is willing to proceed. Appears comfortable on exam. No JVD. Cor RRR Lungs CTA no edema.   Will need to sort out NSTEMI vs Tako-tsubo like event.   Glori Bickers, MD  3:06 PM

## 2018-06-24 NOTE — Progress Notes (Signed)
Patient requesting something for cough.  RN offered PRN Tessalon. Per patient not effective and would like to try something else.  RN ordered Robitussin via Cardiology PRN medications per previous electronic order.

## 2018-06-24 NOTE — H&P (View-Only) (Signed)
Advanced Heart Failure Rounding Note  PCP-Cardiologist: No primary care provider on file.   Subjective:    Yesterday, after discharge orders placed, pt developed sudden onset severe chest pain. Unable to get comfortable. Was taken for stat Chest CT which was negative for dissection or PE. Pain relieved somewhat with 1 dose dilaudid and pt was able to rest.   Overnight pain well controlled. D-dimer negative. Troponin labs not done, but stat this am Trop 13.3 (confirmed with lab as this was re-sent and not entered due to it being discrepant from previous value). Starting on heparin and plan for LHC this afternoon.  Feeling OK this am. Chest pain mostly resolved. Mild headache. Breathing OK. Still having sore throat.   Objective:   Weight Range: 94.2 kg Body mass index is 33.52 kg/m.   Vital Signs:   Temp:  [97.7 F (36.5 C)-98.5 F (36.9 C)] 98.1 F (36.7 C) (11/07 0843) Pulse Rate:  [78-87] 83 (11/07 0843) Resp:  [16-24] 20 (11/07 0914) BP: (107-126)/(64-88) 109/74 (11/07 0914) SpO2:  [92 %-99 %] 93 % (11/07 0847) Weight:  [94.2 kg] 94.2 kg (11/07 0510) Last BM Date: 06/22/18(per patient)  Weight change: Filed Weights   06/22/18 1454 06/22/18 2233 06/24/18 0510  Weight: 94.3 kg 95.3 kg 94.2 kg    Intake/Output:   Intake/Output Summary (Last 24 hours) at 06/24/2018 0919 Last data filed at 06/23/2018 2246 Gross per 24 hour  Intake 360 ml  Output 0 ml  Net 360 ml      Physical Exam    General: Fatigued appearing. No resp difficulty. HEENT: Normal Neck: Supple. JVP 6-7 cm. Carotids 2+ bilat; no bruits. No thyromegaly or nodule noted. Cor: PMI nondisplaced. Frequent ectopy. No M/G/R noted Lungs: CTAB, normal effort. Abdomen: Soft, non-tender, non-distended, no HSM. No bruits or masses. +BS  Extremities: No cyanosis, clubbing, or rash. R and LLE no edema.  Neuro: Alert & orientedx3, cranial nerves grossly intact. moves all 4 extremities w/o difficulty. Affect  pleasant   Telemetry   NSR 70-80s with frequent PVCs, had episode of HR in 40s overnight, personally reviewed.   EKG    06/23/18 78 bpm with trigeminy, personally reviewed.    Labs    CBC Recent Labs    06/22/18 1526  WBC 5.4  NEUTROABS 2.4  HGB 12.6  HCT 40.4  MCV 84.3  PLT 607   Basic Metabolic Panel Recent Labs    06/22/18 1526  06/23/18 0402 06/24/18 0418  NA 139   < > 141 140  K 3.3*   < > 3.7 4.2  CL 109   < > 112* 108  CO2 24   < > 27 24  GLUCOSE 109*   < > 150* 98  BUN 8   < > 7 6  CREATININE 0.77   < > 0.84 0.69  CALCIUM 8.7*   < > 8.7* 8.9  MG 1.9  --   --  2.2   < > = values in this interval not displayed.   Liver Function Tests Recent Labs    06/22/18 1526 06/22/18 2350  AST 19 19  ALT 13 11  ALKPHOS 57 55  BILITOT 0.8 0.4  PROT 7.4 6.4*  ALBUMIN 3.6 3.1*   No results for input(s): LIPASE, AMYLASE in the last 72 hours. Cardiac Enzymes Recent Labs    06/22/18 1526 06/23/18 1103 06/23/18 1557  TROPONINI <0.03 <0.03 0.10*    BNP: BNP (last 3 results) Recent Labs  12/25/17 0017 06/22/18 1526  BNP 113.0* 615.0*    ProBNP (last 3 results) No results for input(s): PROBNP in the last 8760 hours.   D-Dimer Recent Labs    06/24/18 0418  DDIMER 0.39   Hemoglobin A1C Recent Labs    06/22/18 1202  HGBA1C 6.7*   Fasting Lipid Panel Recent Labs    06/22/18 1202  CHOL 91  HDL 23*  LDLCALC 50  TRIG 94  CHOLHDL 4.0   Thyroid Function Tests Recent Labs    06/22/18 2350  TSH 1.762    Other results:   Imaging    Ct Angio Chest Aorta W/cm &/or Wo/cm  Result Date: 06/23/2018 CLINICAL DATA:  Chest pain. EXAM: CT ANGIOGRAPHY CHEST WITH CONTRAST TECHNIQUE: Multidetector CT imaging of the chest was performed using the standard protocol during bolus administration of intravenous contrast. Multiplanar CT image reconstructions and MIPs were obtained to evaluate the vascular anatomy. CONTRAST:  145mL ISOVUE-370 IOPAMIDOL  (ISOVUE-370) INJECTION 76% COMPARISON:  Radiographs of June 22, 2018. CT scan of June 29, 2017. FINDINGS: Cardiovascular: There is no evidence of thoracic aortic aneurysm or dissection. Atherosclerotic plaque is noted in the proximal descending thoracic aorta. Mild cardiomegaly is noted. No pericardial effusion is noted. Coronary artery calcifications are noted. No definite evidence of pulmonary embolus is noted. Mediastinum/Nodes: No enlarged mediastinal, hilar, or axillary lymph nodes. Thyroid gland, trachea, and esophagus demonstrate no significant findings. Lungs/Pleura: No pneumothorax or pleural effusion is noted. 6 mm nodule is seen posteriorly in the left upper lobe best seen on image number 22 of series 8. No other pulmonary abnormality is noted. Upper Abdomen: No acute abnormality. Musculoskeletal: No chest wall abnormality. No acute or significant osseous findings. Review of the MIP images confirms the above findings. IMPRESSION: No evidence of thoracic aortic dissection or aneurysm is noted. Small atherosclerotic plaque is noted in the proximal descending thoracic aorta. Coronary artery calcifications are noted suggesting coronary artery disease. 6 mm nodule is noted in left upper lobe. Non-contrast chest CT at 6-12 months is recommended. If the nodule is stable at time of repeat CT, then future CT at 18-24 months (from today's scan) is considered optional for low-risk patients, but is recommended for high-risk patients. This recommendation follows the consensus statement: Guidelines for Management of Incidental Pulmonary Nodules Detected on CT Images: From the Fleischner Society 2017; Radiology 2017; 284:228-243. Aortic Atherosclerosis (ICD10-I70.0). Electronically Signed   By: Marijo Conception, M.D.   On: 06/23/2018 12:35     Medications:     Scheduled Medications: . ALPRAZolam  1 mg Oral QHS  . aspirin EC  81 mg Oral Daily  . atorvastatin  10 mg Oral Daily  . carvedilol  12.5 mg Oral  BID WC  . cholecalciferol  1,000 Units Oral Daily  . clopidogrel  75 mg Oral Daily  . digoxin  0.125 mg Oral QODAY  . fenofibrate  54 mg Oral Daily  . furosemide  20 mg Oral Daily  . heparin  4,000 Units Intravenous Once  .  morphine injection  2 mg Intravenous Once  . potassium chloride  20 mEq Oral Daily  . sacubitril-valsartan  1 tablet Oral BID  . sodium chloride flush  3 mL Intravenous Q12H  . sodium chloride flush  3 mL Intravenous Q12H    Infusions: . sodium chloride    . sodium chloride    . sodium chloride    . heparin      PRN Medications: sodium chloride, sodium chloride, acetaminophen,  albuterol, benzonatate, ondansetron (ZOFRAN) IV, sodium chloride flush, sodium chloride flush    Patient Profile   [name] is a 52 y.o. female with CAD s/p PCI to RCA 2011, mixed ICM/NICM NYHA II, ACC B (EF 20%, s/p ICD) c/b NSVT, SLE, GERD, and ongoing tobacco use who is admitted with hypotension and cough.   Assessment/Plan   1. Chest pain in CAD patient  - s/p remote stent. Last cath 2004. - stat chest CT 11/6 with no evidence of dissection. Negative for PE. D-dimer WNL.  - With troponin elevation, will plan for LHC with possible angiography this afternoon.  - Start heparin.  - Myoview 11/18 EF 13%, no obvious ischemia - Continue ASA and statin  2. Hypotension - Stable and near her baselines which run in 90-100s.  3. Acute on chronic systolic CHF - Echo 3/71/06 LVEF 15-20%, Grade 2 DD, Mod MR, Severe LAE, Mod RV reduction - Volume status OK on exam.  - Continue coreg 12.5 mg BID - Continue Entrest 24/26 mg BID - Continue digoxin 0.125 mg daily. Level stable 06/22/18  4. NSVT/VT - Managed by Dr. Caryl Comes. S/p MDT ICD.  - Intolerant to amiodarone and mexiletine - s/p ICD shock 12/2017 in setting of electrolytes. Can drive again this month.  - No VT.  - ZIO patch 8-04/2018 with predominantly sinus rhythm with > 7900 runs of NSVT/VT, longest 30 seconds.  5.  Cough/Sore throat - No fevers/chills or production. Suspect viral. Recommended symptomatic treatment with saltwater gurgle, chloraseptic spray, and tylenol. Follow up with PCP as needed. No change.   6. Tobacco abuse - Encouraged complete cessation. No change.   With acute chest pain 06/23/18 and up-trend in troponin, plan for LHC this afternoon.   Medication concerns reviewed with patient and pharmacy team. Barriers identified: None at this time.   Length of Stay: 0  Annamaria Helling  06/24/2018, 9:19 AM  Advanced Heart Failure Team Pager [phone] (M-F; 7a - 4p)  Please contact [facility] for night-coverage after hours (4p -7a ) and weekends on [email or web address]    Patient seen and examined with the above-signed Advanced Practice Provider and/or Housestaff. I personally reviewed laboratory data, imaging studies and relevant notes. I independently examined the patient and formulated the important aspects of the plan. I have edited the note to reflect any of my changes or salient points. I have personally discussed the plan with the patient and/or family.  CP resolved. But troponin has now bumped to 30. ECG unchanged. Heparin and ASA started. Discussed risks/indications for cath and she is willing to proceed. Appears comfortable on exam. No JVD. Cor RRR Lungs CTA no edema.   Will need to sort out NSTEMI vs Tako-tsubo like event.   Glori Bickers, MD  3:06 PM

## 2018-06-24 NOTE — Interval H&P Note (Signed)
History and Physical Interval Note:  06/24/2018 4:00 PM  [name]  has presented today for surgery, with the diagnosis of NSTEMI  The various methods of treatment have been discussed with the patient and family. After consideration of risks, benefits and other options for treatment, the patient has consented to  Procedure(s): LEFT HEART CATH AND CORONARY ANGIOGRAPHY (N/A) and possible coronary angioplasty as a surgical intervention .  The patient's history has been reviewed, patient examined, no change in status, stable for surgery.  I have reviewed the patient's chart and labs.  Questions were answered to the patient's satisfaction.     Daniel Bensimhon

## 2018-06-24 NOTE — Progress Notes (Signed)
ANTICOAGULATION CONSULT NOTE - Initial Consult  Pharmacy Consult for Heparin Indication: chest pain/ACS  Allergies  Allergen Reactions  . Amiodarone     Headache and facial swelling  . Bee Venom Anaphylaxis  . Mexiletine   . Cortisone Swelling  . Robaxin [Methocarbamol] Hives and Itching    Patient Measurements: Height: 5\' 6"  (167.6 cm) Weight: 207 lb 11.2 oz (94.2 kg) IBW/kg (Calculated) : 59.3 HEPARIN DW (KG): 80.5   Vital Signs: Temp: 98 F (36.7 C) (11/07 0510) Temp Source: Oral (11/07 0510) BP: 111/78 (11/07 0510) Pulse Rate: 87 (11/07 0510)  Labs: Recent Labs    06/22/18 1526 06/22/18 2350 06/23/18 0402 06/23/18 1103 06/23/18 1557 06/24/18 0418  HGB 12.6  --   --   --   --   --   HCT 40.4  --   --   --   --   --   PLT 264  --   --   --   --   --   LABPROT 13.4  --   --   --   --   --   INR 1.03  --   --   --   --   --   CREATININE 0.77 0.79 0.84  --   --  0.69  TROPONINI <0.03  --   --  <0.03 0.10*  --     Estimated Creatinine Clearance: 93 mL/min (by C-G formula based on SCr of 0.69 mg/dL).   Medical History: Past Medical History:  Diagnosis Date  . Arthritis of knee   . Automatic implantable cardiac defibrillator in situ    a. s/p prior Medtronic ICD with 6949 lead. b. s/p Gen change & lead revision 05/2013.  . Cardiomyopathy secondary    Patient has cardiomyopathy out of proportion to her ischemic heart disease  . Chronic pain   . coronary artery disease    RCA stenting Myoview 2011 EF 30% infarction dilatation without ischemia  . Depression   . Diabetes mellitus   . GERD (gastroesophageal reflux disease)   . Insomnia   . Lupus (systemic lupus erythematosus) ([place])   . Membranous glomerulonephritis   . Migraines   . Myocardial infarction ([place])    8 total   . Nicotine abuse   . Other and unspecified hyperlipidemia   . Paroxysmal VT ([place])   . Systolic CHF ([place])   . VF (ventricular fibrillation) ([place])    a. Hx appropriate ICD therapy for  VF.    Medications:  Scheduled:  . ALPRAZolam  1 mg Oral QHS  . aspirin EC  81 mg Oral Daily  . atorvastatin  10 mg Oral Daily  . carvedilol  12.5 mg Oral BID WC  . cholecalciferol  1,000 Units Oral Daily  . clopidogrel  75 mg Oral Daily  . digoxin  0.125 mg Oral QODAY  . fenofibrate  54 mg Oral Daily  . furosemide  20 mg Oral Daily  . heparin  4,000 Units Intravenous Once  .  morphine injection  2 mg Intravenous Once  . potassium chloride  20 mEq Oral Daily  . sacubitril-valsartan  1 tablet Oral BID  . sodium chloride flush  3 mL Intravenous Q12H   Infusions:  . sodium chloride    . heparin      Assessment: Pt is a 77 yoF with chest pain. Troponin 0.03 > 0.1 > 13.3.  Will plan for LHC this afternoon. Pharmacy consulted to start heparin for concerns for ACS. Hb 12.6.  Goal of Therapy:  Heparin level 0.3-0.7 units/ml Monitor platelets by anticoagulation protocol: Yes   Plan:  Give 4000 units bolus x 1 Start heparin infusion at 900 units/hr  Follow up after cath to determine further plans  Claiborne Billings, PharmD PGY2 Cardiology Pharmacy Resident Phone [phone] Please check AMION for all Pharmacist numbers by unit 06/24/2018 9:18 AM

## 2018-06-24 NOTE — Progress Notes (Signed)
   Called lab regarding Troponin discrepancy. Trop this am elevated to 13.3.  No chest pain currently.  Will plan cath this afternoon with possible angioplasty.  All above discussed with Dr. Gareth Morgan "[address] [place], [place] 06/24/2018 8:31 AM

## 2018-06-25 ENCOUNTER — Encounter (HOSPITAL_COMMUNITY): Payer: Self-pay | Admitting: Internal Medicine

## 2018-06-25 DIAGNOSIS — E119 Type 2 diabetes mellitus without complications: Secondary | ICD-10-CM | POA: Diagnosis present

## 2018-06-25 DIAGNOSIS — F17210 Nicotine dependence, cigarettes, uncomplicated: Secondary | ICD-10-CM | POA: Diagnosis present

## 2018-06-25 DIAGNOSIS — E876 Hypokalemia: Secondary | ICD-10-CM | POA: Diagnosis present

## 2018-06-25 DIAGNOSIS — I2510 Atherosclerotic heart disease of native coronary artery without angina pectoris: Secondary | ICD-10-CM | POA: Diagnosis present

## 2018-06-25 DIAGNOSIS — I428 Other cardiomyopathies: Secondary | ICD-10-CM | POA: Diagnosis present

## 2018-06-25 DIAGNOSIS — I959 Hypotension, unspecified: Secondary | ICD-10-CM | POA: Diagnosis present

## 2018-06-25 DIAGNOSIS — I493 Ventricular premature depolarization: Secondary | ICD-10-CM | POA: Diagnosis present

## 2018-06-25 DIAGNOSIS — M1710 Unilateral primary osteoarthritis, unspecified knee: Secondary | ICD-10-CM | POA: Diagnosis present

## 2018-06-25 DIAGNOSIS — Z6833 Body mass index (BMI) 33.0-33.9, adult: Secondary | ICD-10-CM | POA: Diagnosis not present

## 2018-06-25 DIAGNOSIS — I5043 Acute on chronic combined systolic (congestive) and diastolic (congestive) heart failure: Secondary | ICD-10-CM | POA: Diagnosis not present

## 2018-06-25 DIAGNOSIS — R160 Hepatomegaly, not elsewhere classified: Secondary | ICD-10-CM | POA: Diagnosis not present

## 2018-06-25 DIAGNOSIS — J069 Acute upper respiratory infection, unspecified: Secondary | ICD-10-CM | POA: Diagnosis present

## 2018-06-25 DIAGNOSIS — K5790 Diverticulosis of intestine, part unspecified, without perforation or abscess without bleeding: Secondary | ICD-10-CM | POA: Diagnosis not present

## 2018-06-25 DIAGNOSIS — I252 Old myocardial infarction: Secondary | ICD-10-CM | POA: Diagnosis not present

## 2018-06-25 DIAGNOSIS — Z95811 Presence of heart assist device: Secondary | ICD-10-CM | POA: Diagnosis not present

## 2018-06-25 DIAGNOSIS — M329 Systemic lupus erythematosus, unspecified: Secondary | ICD-10-CM | POA: Diagnosis present

## 2018-06-25 DIAGNOSIS — E785 Hyperlipidemia, unspecified: Secondary | ICD-10-CM | POA: Diagnosis present

## 2018-06-25 DIAGNOSIS — I255 Ischemic cardiomyopathy: Secondary | ICD-10-CM | POA: Diagnosis not present

## 2018-06-25 DIAGNOSIS — E875 Hyperkalemia: Secondary | ICD-10-CM | POA: Diagnosis present

## 2018-06-25 DIAGNOSIS — Z01810 Encounter for preprocedural cardiovascular examination: Secondary | ICD-10-CM | POA: Diagnosis not present

## 2018-06-25 DIAGNOSIS — E669 Obesity, unspecified: Secondary | ICD-10-CM | POA: Diagnosis present

## 2018-06-25 DIAGNOSIS — T82855A Stenosis of coronary artery stent, initial encounter: Secondary | ICD-10-CM | POA: Diagnosis present

## 2018-06-25 DIAGNOSIS — I5023 Acute on chronic systolic (congestive) heart failure: Secondary | ICD-10-CM | POA: Diagnosis not present

## 2018-06-25 DIAGNOSIS — E43 Unspecified severe protein-calorie malnutrition: Secondary | ICD-10-CM | POA: Diagnosis present

## 2018-06-25 DIAGNOSIS — Z720 Tobacco use: Secondary | ICD-10-CM | POA: Diagnosis not present

## 2018-06-25 DIAGNOSIS — I472 Ventricular tachycardia: Secondary | ICD-10-CM | POA: Diagnosis present

## 2018-06-25 DIAGNOSIS — J449 Chronic obstructive pulmonary disease, unspecified: Secondary | ICD-10-CM | POA: Diagnosis present

## 2018-06-25 DIAGNOSIS — Z9861 Coronary angioplasty status: Secondary | ICD-10-CM | POA: Diagnosis not present

## 2018-06-25 DIAGNOSIS — I25110 Atherosclerotic heart disease of native coronary artery with unstable angina pectoris: Secondary | ICD-10-CM | POA: Diagnosis not present

## 2018-06-25 DIAGNOSIS — I5022 Chronic systolic (congestive) heart failure: Secondary | ICD-10-CM | POA: Diagnosis not present

## 2018-06-25 DIAGNOSIS — I214 Non-ST elevation (NSTEMI) myocardial infarction: Secondary | ICD-10-CM | POA: Diagnosis not present

## 2018-06-25 DIAGNOSIS — Z95810 Presence of automatic (implantable) cardiac defibrillator: Secondary | ICD-10-CM | POA: Diagnosis not present

## 2018-06-25 LAB — BASIC METABOLIC PANEL
Anion gap: 6 (ref 5–15)
BUN: 6 mg/dL (ref 6–20)
CHLORIDE: 110 mmol/L (ref 98–111)
CO2: 22 mmol/L (ref 22–32)
CREATININE: 0.83 mg/dL (ref 0.44–1.00)
Calcium: 8.2 mg/dL — ABNORMAL LOW (ref 8.9–10.3)
GFR calc Af Amer: >60 mL/min (ref >60)
GLUCOSE: 110 mg/dL — AB (ref 70–99)
POTASSIUM: 4.2 mmol/L (ref 3.5–5.1)
Sodium: 138 mmol/L (ref 135–145)

## 2018-06-25 LAB — CBC
HCT: 37.3 % (ref 36.0–46.0)
HEMOGLOBIN: 11.4 g/dL — AB (ref 12.0–15.0)
MCH: 26.4 pg (ref 26.0–34.0)
MCHC: 30.6 g/dL (ref 30.0–36.0)
MCV: 86.3 fL (ref 80.0–100.0)
NRBC: 0 % (ref 0.0–0.2)
Platelets: 205 10*3/uL (ref 150–400)
RBC: 4.32 MIL/uL (ref 3.87–5.11)
RDW: 13.6 % (ref 11.5–15.5)
WBC: 5.6 10*3/uL (ref 4.0–10.5)

## 2018-06-25 LAB — TYPE AND SCREEN
ABO/RH(D): B POS
Antibody Screen: NEGATIVE

## 2018-06-25 LAB — ABO/RH: ABO/RH(D): B POS

## 2018-06-25 LAB — HEPARIN LEVEL (UNFRACTIONATED)

## 2018-06-25 LAB — TROPONIN I: TROPONIN I: 30.28 ng/mL — AB (ref <0.03)

## 2018-06-25 MED ORDER — ENOXAPARIN SODIUM 40 MG/0.4ML ~~LOC~~ SOLN
40.0000 mg | SUBCUTANEOUS | Status: DC
  Administered 2018-06-25 – 2018-06-28 (×4): 40 mg via SUBCUTANEOUS
  Filled 2018-06-25 (×4): qty 0.4

## 2018-06-25 MED ORDER — ATORVASTATIN CALCIUM 40 MG PO TABS
40.0000 mg | ORAL_TABLET | Freq: Every day | ORAL | Status: DC
  Administered 2018-06-26 – 2018-06-30 (×5): 40 mg via ORAL
  Filled 2018-06-25 (×5): qty 1

## 2018-06-25 NOTE — Progress Notes (Signed)
ANTICOAGULATION CONSULT NOTE - [place] for Heparin Indication: chest pain/ACS  Allergies  Allergen Reactions  . Amiodarone     Headache and facial swelling  . Bee Venom Anaphylaxis  . Mexiletine   . Cortisone Swelling  . Robaxin [Methocarbamol] Hives and Itching    Patient Measurements: Height: 5\' 6"  (167.6 cm) Weight: 206 lb 12.7 oz (93.8 kg) IBW/kg (Calculated) : 59.3 HEPARIN DW (KG): 80.5   Vital Signs: Temp: 98.7 F (37.1 C) (11/08 0741) Temp Source: Oral (11/08 0741) BP: 108/74 (11/08 0741) Pulse Rate: 85 (11/08 0741)  Labs: Recent Labs    06/22/18 1526  06/23/18 0402 06/23/18 1103 06/23/18 1557 06/24/18 0418 06/24/18 0902 06/25/18 0230 06/25/18 0841  HGB 12.6  --   --   --   --   --   --  11.4*  --   HCT 40.4  --   --   --   --   --   --  37.3  --   PLT 264  --   --   --   --   --   --  205  --   LABPROT 13.4  --   --   --   --   --   --   --   --   INR 1.03  --   --   --   --   --   --   --   --   HEPARINUNFRC  --   --   --   --   --   --   --  <0.10* <0.10*  CREATININE 0.77   < > 0.84  --   --  0.69  --  0.83  --   TROPONINI <0.03  --   --  <0.03 0.10*  --  30.28*  --   --    < > = values in this interval not displayed.    Estimated Creatinine Clearance: 89.4 mL/min (by C-G formula based on SCr of 0.83 mg/dL).   Medical History: Past Medical History:  Diagnosis Date  . Arthritis of knee   . Automatic implantable cardiac defibrillator in situ    a. s/p prior Medtronic ICD with 6949 lead. b. s/p Gen change & lead revision 05/2013.  . Cardiomyopathy secondary    Patient has cardiomyopathy out of proportion to her ischemic heart disease  . Chronic pain   . coronary artery disease    RCA stenting Myoview 2011 EF 30% infarction dilatation without ischemia  . Depression   . Diabetes mellitus   . GERD (gastroesophageal reflux disease)   . Insomnia   . Lupus (systemic lupus erythematosus) ([place])   . Membranous  glomerulonephritis   . Migraines   . Myocardial infarction ([place])    8 total   . Nicotine abuse   . Other and unspecified hyperlipidemia   . Paroxysmal VT ([place])   . Systolic CHF ([place])   . VF (ventricular fibrillation) ([place])    a. Hx appropriate ICD therapy for VF.    Medications:  Scheduled:  . ALPRAZolam  1 mg Oral QHS  . aspirin EC  81 mg Oral Daily  . atorvastatin  10 mg Oral Daily  . carvedilol  12.5 mg Oral BID WC  . cholecalciferol  1,000 Units Oral Daily  . clopidogrel  75 mg Oral Daily  . digoxin  0.125 mg Oral QODAY  . fenofibrate  54 mg Oral Daily  . furosemide  20 mg  Oral Daily  .  morphine injection  2 mg Intravenous Once  . potassium chloride  20 mEq Oral Daily  . sacubitril-valsartan  1 tablet Oral BID  . sodium chloride flush  3 mL Intravenous Q12H  . sodium chloride flush  3 mL Intravenous Q12H   Infusions:  . sodium chloride    . sodium chloride    . heparin 900 Units/hr (06/25/18 0600)    Assessment: 14 yoF who developed CP and was started on IV heparin now s/p LHC which demonstrates in-stent lesion and significant LM disease. Pt resumed IV heparin while decision is made regarding revascularization.  Initial heparin level undetectable on heparin 900 units/hr. Hgb decreased to 11.4. No signs/symptoms of bleeding or issues with infusion reported by nursing.   Goal of Therapy:  Heparin level 0.3-0.7 units/ml Monitor platelets by anticoagulation protocol: Yes   Plan:  -Increase heparin 1100 units/hr  -Check 6hr heparin level -Follow-up longterm plans and length of heparin therapy   Claiborne Billings, PharmD PGY2 Cardiology Pharmacy Resident Phone [phone] Please check AMION for all Pharmacist numbers by unit 06/25/2018 10:48 AM

## 2018-06-25 NOTE — Progress Notes (Signed)
ANTICOAGULATION CONSULT NOTE - [place] for Heparin Indication: chest pain/ACS  Allergies  Allergen Reactions  . Amiodarone     Headache and facial swelling  . Bee Venom Anaphylaxis  . Mexiletine   . Cortisone Swelling  . Robaxin [Methocarbamol] Hives and Itching    Patient Measurements: Height: 5\' 6"  (167.6 cm) Weight: 206 lb 12.7 oz (93.8 kg) IBW/kg (Calculated) : 59.3 HEPARIN DW (KG): 80.5   Vital Signs: Temp: 98.7 F (37.1 C) (11/08 0741) Temp Source: Oral (11/08 0741) BP: 95/68 (11/08 1058) Pulse Rate: 100 (11/08 1058)  Labs: Recent Labs    06/23/18 0402 06/23/18 1103 06/23/18 1557 06/24/18 0418 06/24/18 0902 06/25/18 0230 06/25/18 0841  HGB  --   --   --   --   --  11.4*  --   HCT  --   --   --   --   --  37.3  --   PLT  --   --   --   --   --  205  --   HEPARINUNFRC  --   --   --   --   --  <0.10* <0.10*  CREATININE 0.84  --   --  0.69  --  0.83  --   TROPONINI  --  <0.03 0.10*  --  30.28*  --   --     Estimated Creatinine Clearance: 89.4 mL/min (by C-G formula based on SCr of 0.83 mg/dL).   Medical History: Past Medical History:  Diagnosis Date  . Arthritis of knee   . Automatic implantable cardiac defibrillator in situ    a. s/p prior Medtronic ICD with 6949 lead. b. s/p Gen change & lead revision 05/2013.  . Cardiomyopathy secondary    Patient has cardiomyopathy out of proportion to her ischemic heart disease  . Chronic pain   . coronary artery disease    RCA stenting Myoview 2011 EF 30% infarction dilatation without ischemia  . Depression   . Diabetes mellitus   . GERD (gastroesophageal reflux disease)   . Insomnia   . Lupus (systemic lupus erythematosus) ([place])   . Membranous glomerulonephritis   . Migraines   . Myocardial infarction ([place])    8 total   . Nicotine abuse   . Other and unspecified hyperlipidemia   . Paroxysmal VT ([place])   . Systolic CHF ([place])   . VF (ventricular fibrillation) ([place])    a. Hx  appropriate ICD therapy for VF.    Medications:  Scheduled:  . ALPRAZolam  1 mg Oral QHS  . aspirin EC  81 mg Oral Daily  . [START ON 06/26/2018] atorvastatin  40 mg Oral Daily  . carvedilol  12.5 mg Oral BID WC  . cholecalciferol  1,000 Units Oral Daily  . clopidogrel  75 mg Oral Daily  . digoxin  0.125 mg Oral QODAY  . enoxaparin (LOVENOX) injection  40 mg Subcutaneous Q24H  . fenofibrate  54 mg Oral Daily  . furosemide  20 mg Oral Daily  .  morphine injection  2 mg Intravenous Once  . potassium chloride  20 mEq Oral Daily  . sacubitril-valsartan  1 tablet Oral BID  . sodium chloride flush  3 mL Intravenous Q12H  . sodium chloride flush  3 mL Intravenous Q12H   Infusions:  . sodium chloride    . sodium chloride      Assessment: 78 yoF who developed CP and was started on IV heparin now s/p LHC  which demonstrates in-stent lesion and significant LM disease.   Per CVTS, pt will not tolerate CABG and PCI unlikely to be beneficial. Pt is still considering whether to pursue VAD implant. IV heparin has been d/c'ed per MD and pharmacy consulted to start VTE prophylaxis with Lovenox.   Goal of Therapy:  Heparin level 0.3-0.7 units/ml Monitor platelets by anticoagulation protocol: Yes   Plan:  -IV heparin stopped per cardiology -Start Lovenox 40 mg SQ daily now  -Pharmacy to sign off    Albertina Parr, PharmD., BCPS Clinical Pharmacist Clinical phone for 06/25/18 until 10:30pm: [phone] If after 3:30pm, please refer to [facility] for unit-specific pharmacist

## 2018-06-25 NOTE — Progress Notes (Addendum)
Advanced Heart Failure Rounding Note  PCP-Cardiologist: No primary care provider on file.   Subjective:    Cath yesterday with acute instent mRCA stenosis, also has significant LM disease as below. Will need to be considered for CABG vs PCI vs VAD. Previously pt refused VAD.   Feeling OK this am. Denies further CP. Understands she may need surgery vs PCI.  Denies lightheadedness or dizziness. No SOB.   Cr stable at 0.83 and K 4.2. Weight down 1 lb on po lasix.   LHC 06/24/18  Dist LM lesion is 65% stenosed.  Prox RCA to Mid RCA lesion is 100% stenosed.  Ost Ramus lesion is 60% stenosed.  Non-stenotic Ramus lesion.  Prox LAD lesion is 70% stenosed.   Findings:  Ao = 79/58 (67) LV =  82/12  1. LM: Distal 60-70% lesion 2. LAD: 70% mid 3. Ramus: 60% prox 4. RCA 100% mid in stent with L -> R colalterals  EF 15% with severe global HK  Objective:   Weight Range: 93.8 kg Body mass index is 33.38 kg/m.   Vital Signs:   Temp:  [98.1 F (36.7 C)-100.2 F (37.9 C)] 98.7 F (37.1 C) (11/08 0741) Pulse Rate:  [0-94] 85 (11/08 0741) Resp:  [0-39] 15 (11/08 0741) BP: (89-117)/(55-85) 108/74 (11/08 0741) SpO2:  [0 %-99 %] 97 % (11/08 0741) Weight:  [93.8 kg] 93.8 kg (11/08 0430) Last BM Date: 06/22/18(per patient)  Weight change: Filed Weights   06/22/18 2233 06/24/18 0510 06/25/18 0430  Weight: 95.3 kg 94.2 kg 93.8 kg    Intake/Output:   Intake/Output Summary (Last 24 hours) at 06/25/2018 0830 Last data filed at 06/25/2018 0600 Gross per 24 hour  Intake 351.85 ml  Output -  Net 351.85 ml      Physical Exam    General: Fatigued appearing. No resp difficulty. HEENT: Normal Neck: Supple. JVP 6-7 cm. Carotids 2+ bilat; no bruits. No thyromegaly or nodule noted. Cor: PMI nondisplaced. RRR, No M/G/R noted Lungs: CTAB, normal effort. Abdomen: Soft, non-tender, non-distended, no HSM. No bruits or masses. +BS  Extremities: No cyanosis, clubbing, or rash. R  and LLE no edema.  Neuro: Alert & orientedx3, cranial nerves grossly intact. moves all 4 extremities w/o difficulty. Affect pleasant   Telemetry   NSR 80-90s with frequent PVCs, personally reviewed.   EKG    No new tracings.    Labs    CBC Recent Labs    06/22/18 1526 06/25/18 0230  WBC 5.4 5.6  NEUTROABS 2.4  --   HGB 12.6 11.4*  HCT 40.4 37.3  MCV 84.3 86.3  PLT 264 193   Basic Metabolic Panel Recent Labs    06/22/18 1526  06/24/18 0418 06/25/18 0230  NA 139   < > 140 138  K 3.3*   < > 4.2 4.2  CL 109   < > 108 110  CO2 24   < > 24 22  GLUCOSE 109*   < > 98 110*  BUN 8   < > 6 6  CREATININE 0.77   < > 0.69 0.83  CALCIUM 8.7*   < > 8.9 8.2*  MG 1.9  --  2.2  --    < > = values in this interval not displayed.   Liver Function Tests Recent Labs    06/22/18 1526 06/22/18 2350  AST 19 19  ALT 13 11  ALKPHOS 57 55  BILITOT 0.8 0.4  PROT 7.4 6.4*  ALBUMIN 3.6 3.1*  No results for input(s): LIPASE, AMYLASE in the last 72 hours. Cardiac Enzymes Recent Labs    06/23/18 1103 06/23/18 1557 06/24/18 0902  TROPONINI <0.03 0.10* 30.28*    BNP: BNP (last 3 results) Recent Labs    12/25/17 0017 06/22/18 1526  BNP 113.0* 615.0*    ProBNP (last 3 results) No results for input(s): PROBNP in the last 8760 hours.   D-Dimer Recent Labs    06/24/18 0418  DDIMER 0.39   Hemoglobin A1C Recent Labs    06/22/18 1202  HGBA1C 6.7*   Fasting Lipid Panel Recent Labs    06/22/18 1202  CHOL 91  HDL 23*  LDLCALC 50  TRIG 94  CHOLHDL 4.0   Thyroid Function Tests Recent Labs    06/22/18 2350  TSH 1.762    Other results:   Imaging    No results found.   Medications:     Scheduled Medications: . ALPRAZolam  1 mg Oral QHS  . aspirin EC  81 mg Oral Daily  . atorvastatin  10 mg Oral Daily  . carvedilol  12.5 mg Oral BID WC  . cholecalciferol  1,000 Units Oral Daily  . clopidogrel  75 mg Oral Daily  . digoxin  0.125 mg Oral QODAY  .  fenofibrate  54 mg Oral Daily  . furosemide  20 mg Oral Daily  .  morphine injection  2 mg Intravenous Once  . potassium chloride  20 mEq Oral Daily  . sacubitril-valsartan  1 tablet Oral BID  . sodium chloride flush  3 mL Intravenous Q12H  . sodium chloride flush  3 mL Intravenous Q12H    Infusions: . sodium chloride    . sodium chloride    . heparin 900 Units/hr (06/25/18 0600)    PRN Medications: sodium chloride, sodium chloride, acetaminophen, albuterol, benzonatate, guaiFENesin-dextromethorphan, ondansetron (ZOFRAN) IV, sodium chloride flush, sodium chloride flush    Patient Profile   [name] is a 52 y.o. female with CAD s/p PCI to RCA 2011, mixed ICM/NICM NYHA II, ACC B (EF 20%, s/p ICD) c/b NSVT, SLE, GERD, and ongoing tobacco use who is admitted with hypotension and cough.   Assessment/Plan   1. NSTEMI in CAD - s/p remote stent. Last cath 2004. - Cath 06/24/18 with acute in-stent stenosis of mRCA stent and significant LM disease.  - Will need to discuss CABG vs PCI vs VAD.  - Continue heparin for now.  - Continue ASA and statin  2. Hypotension - Stable and near her baselines which run in 90-100s. No change.   3. Acute on chronic systolic CHF - Echo 0/98/11 LVEF 15-20%, Grade 2 DD, Mod MR, Severe LAE, Mod RV reduction - Volume status OK on exam.   - Continue coreg 12.5 mg BID - Continue Entrest 24/26 mg BID - Continue digoxin 0.125 mg daily. Level stable 06/22/18  4. NSVT/VT - Managed by Dr. Caryl Comes. S/p MDT ICD.  - Intolerant to amiodarone and mexiletine - s/p ICD shock 12/2017 in setting of electrolytes. Can drive again this month.  - Continues to have frequent ectopy, but no VT.  - ZIO patch 8-04/2018 with predominantly sinus rhythm with > 7900 runs of NSVT/VT, longest 30 seconds.  5. Cough/Sore throat - No fevers/chills or production. Suspect viral. Recommended symptomatic treatment with saltwater gurgle, chloraseptic spray, and tylenol. Follow up  with PCP as needed. No change.    6. Tobacco abuse - Encouraged complete cessation. No change.   With worsening CAD, will need  intervention. Planning on going to discuss CABG vs PCI vs VAD. Pt has previously refused VAD.   Medication concerns reviewed with patient and pharmacy team. Barriers identified: None at this time.   Length of Stay: 0  Annamaria Helling  06/25/2018, 8:30 AM  Advanced Heart Failure Team Pager [phone] (M-F; 7a - 4p)  Please contact [facility] for night-coverage after hours (4p -7a ) and weekends on [email or web address]  Patient seen and examined with the above-signed Advanced Practice Provider and/or Housestaff. I personally reviewed laboratory data, imaging studies and relevant notes. I independently examined the patient and formulated the important aspects of the plan. I have edited the note to reflect any of my changes or salient points. I have personally discussed the plan with the patient and/or family.  I have reviewed studies and case extensively with Drs. Prescott Gum and [place]. Left main disease is quite severe and isolated PCI of RCA is likely not an option and would not be able to tolerate CABG. Only real option is VAD implant and she is adamantly against this. Will continue with medical therapy with the understanding that her life expectancy likely < 1 year.   Glori Bickers, MD  3:09 PM   Addendum:  I discussed LVAD again with [name] and her family and she is now interested in proceeding. Discussed with VAD tea. Will begin formal w/u.   Glori Bickers, MD  4:30 PM

## 2018-06-25 NOTE — Consult Note (Signed)
[address]       [place] [address]             [phone]        [name] [facility] Medical Record #[id number] Date of Birth: Mar 19, 1966  Referring: No ref. provider found Primary Care: Fayrene Helper, MD Primary Cardiologist:No primary care provider on file.  Chief Complaint:   Shortness of breath, fatigue Chief Complaint  Patient presents with  . Hypotension  Patient examined, images of coronary angiogram and 2D echocardiogram and CT scan of chest all personally reviewed and discussed with patient.  History of Present Illness:     52 year old obese female with SLE and mixed ischemic-nonischemic cardiomyopathy status post AICD implant 2014 admitted with symptoms of heart failure.  During the hospital stay she developed acute chest pain and had a non-STEMI probably related to a and old RCA stent.  Cardiac catheterization demonstrated occlusion of the mid RCA.  LVEDP was normal.  Echocardiogram demonstrated severe LV dysfunction with EF 10-15%.  There was moderate MR.  RV function was moderately reduced.  Patient is a smoker with evidence of COPD on her CT scan of chest but without suspicious nodules or adenopathy.  PFTs have not been performed.  Patient is on Entresto, Plavix, Lanoxin, Lasix, and Coreg.  The patient's LV function is too poor for CABG. Her renal and hepatic function appear to be adequate for consideration of VAD therapy. Current Activity/ Functional Status: Patient tolerates only minimal activity   Zubrod Score: At the time of surgery this patient's most appropriate activity status/level should be described as: []     0    Normal activity, no symptoms []     1    Restricted in physical strenuous activity but ambulatory, able to do out light work [x]     2    Ambulatory and capable of self care, unable to do work activities, up and about                 more than 50%  Of the time                            []     3    Only  limited self care, in bed greater than 50% of waking hours []     4    Completely disabled, no self care, confined to bed or chair []     5    Moribund  Past Medical History:  Diagnosis Date  . Arthritis of knee   . Automatic implantable cardiac defibrillator in situ    a. s/p prior Medtronic ICD with 6949 lead. b. s/p Gen change & lead revision 05/2013.  . Cardiomyopathy secondary    Patient has cardiomyopathy out of proportion to her ischemic heart disease  . Chronic pain   . coronary artery disease    RCA stenting Myoview 2011 EF 30% infarction dilatation without ischemia  . Depression   . Diabetes mellitus   . GERD (gastroesophageal reflux disease)   . Insomnia   . Lupus (systemic lupus erythematosus) ([place])   . Membranous glomerulonephritis   . Migraines   . Myocardial infarction ([place])    8 total   . Nicotine abuse   . Other and unspecified hyperlipidemia   . Paroxysmal VT ([place])   . Systolic CHF ([place])   . VF (ventricular fibrillation) ([place])    a. Hx appropriate ICD  therapy for VF.    Past Surgical History:  Procedure Laterality Date  . ABDOMINAL HYSTERECTOMY    . CHOLECYSTECTOMY    . COLONOSCOPY  07/15/2011   SLF: internal hemorrhoids/hyperplastic polyps in the rectum/tubular adenomaSURVEILLANCE Nov 2017  . defibrillator placed   2009 and 05/2013  . ICD GENERATOR CHANGE  06/17/2013   Dr Caryl Comes  . IMPLANTABLE CARDIOVERTER DEFIBRILLATOR (ICD) GENERATOR CHANGE Left 06/17/2013   Procedure: ICD GENERATOR CHANGE;  Surgeon: Deboraha Sprang, MD;  Location: [facility] CATH LAB;  Service: Cardiovascular;  Laterality: Left;  . LEAD REVISION N/A 06/17/2013   Procedure: LEAD REVISION;  Surgeon: Deboraha Sprang, MD;  Location: [facility] CATH LAB;  Service: Cardiovascular;  Laterality: N/A;  . LEFT HEART CATH AND CORONARY ANGIOGRAPHY N/A 06/24/2018   Procedure: LEFT HEART CATH AND CORONARY ANGIOGRAPHY;  Surgeon: Jolaine Artist, MD;  Location: [facility] CV LAB;  Service: Cardiovascular;   Laterality: N/A;  . ROTATOR CUFF REPAIR Right 2002    Social History   Tobacco Use  Smoking Status Current Some Day Smoker  . Packs/day: 0.50  . Years: 15.00  . Pack years: 7.50  . Types: Cigarettes  Smokeless Tobacco Never Used    Social History   Substance and Sexual Activity  Alcohol Use Yes  . Alcohol/week: 0.0 standard drinks   Comment: occasionaly     Allergies  Allergen Reactions  . Amiodarone     Headache and facial swelling  . Bee Venom Anaphylaxis  . Mexiletine   . Cortisone Swelling  . Robaxin [Methocarbamol] Hives and Itching    Current Facility-Administered Medications  Medication Dose Route Frequency Provider Last Rate Last Dose  . 0.9 %  sodium chloride infusion  250 mL Intravenous PRN Bensimhon, Daniel R, MD      . 0.9 %  sodium chloride infusion  250 mL Intravenous PRN Bensimhon, Shaune Pascal, MD      . acetaminophen (TYLENOL) tablet 650 mg  650 mg Oral Q4H PRN Bensimhon, Shaune Pascal, MD   650 mg at 06/25/18 0510  . albuterol (PROVENTIL) (2.5 MG/3ML) 0.083% nebulizer solution 3 mL  3 mL Inhalation Q6H PRN Bensimhon, Shaune Pascal, MD      . ALPRAZolam Duanne Moron) tablet 1 mg  1 mg Oral QHS Bensimhon, Shaune Pascal, MD   1 mg at 06/23/18 0047  . aspirin EC tablet 81 mg  81 mg Oral Daily Bensimhon, Shaune Pascal, MD   81 mg at 06/25/18 1100  . [START ON 06/26/2018] atorvastatin (LIPITOR) tablet 40 mg  40 mg Oral Daily Shirley Friar, PA-C      . benzonatate (TESSALON) capsule 100 mg  100 mg Oral TID PRN Jolaine Artist, MD   100 mg at 06/23/18 2327  . carvedilol (COREG) tablet 12.5 mg  12.5 mg Oral BID WC Bensimhon, Shaune Pascal, MD   12.5 mg at 06/25/18 1059  . cholecalciferol (VITAMIN D) tablet 1,000 Units  1,000 Units Oral Daily Bensimhon, Shaune Pascal, MD   1,000 Units at 06/25/18 1059  . clopidogrel (PLAVIX) tablet 75 mg  75 mg Oral Daily Bensimhon, Shaune Pascal, MD   75 mg at 06/25/18 1100  . digoxin (LANOXIN) tablet 0.125 mg  0.125 mg Oral QODAY Bensimhon, Shaune Pascal, MD    0.125 mg at 06/25/18 1101  . fenofibrate tablet 54 mg  54 mg Oral Daily Bensimhon, Shaune Pascal, MD   54 mg at 06/25/18 1058  . furosemide (LASIX) tablet 20 mg  20 mg Oral Daily Bensimhon, Quillian Quince  R, MD   20 mg at 06/25/18 1059  . guaiFENesin-dextromethorphan (ROBITUSSIN DM) 100-10 MG/5ML syrup 15 mL  15 mL Oral Q4H PRN Bensimhon, Shaune Pascal, MD   15 mL at 06/25/18 0510  . heparin ADULT infusion 100 units/mL (25000 units/231mL sodium chloride 0.45%)  1,100 Units/hr Intravenous Continuous Bensimhon, Shaune Pascal, MD 11 mL/hr at 06/25/18 1101 1,100 Units/hr at 06/25/18 1101  . morphine 2 MG/ML injection 2 mg  2 mg Intravenous Once Bensimhon, Shaune Pascal, MD      . ondansetron [facility]) injection 4 mg  4 mg Intravenous Q8H PRN Bensimhon, Shaune Pascal, MD      . potassium chloride (K-DUR,KLOR-CON) CR tablet 20 mEq  20 mEq Oral Daily Bensimhon, Shaune Pascal, MD   20 mEq at 06/25/18 1059  . sacubitril-valsartan (ENTRESTO) 24-26 mg per tablet  1 tablet Oral BID Bensimhon, Shaune Pascal, MD   1 tablet at 06/25/18 1058  . sodium chloride flush (NS) 0.9 % injection 3 mL  3 mL Intravenous Q12H Bensimhon, Shaune Pascal, MD   3 mL at 06/24/18 1700  . sodium chloride flush (NS) 0.9 % injection 3 mL  3 mL Intravenous PRN Bensimhon, Shaune Pascal, MD      . sodium chloride flush (NS) 0.9 % injection 3 mL  3 mL Intravenous Q12H Bensimhon, Shaune Pascal, MD   3 mL at 06/25/18 1119  . sodium chloride flush (NS) 0.9 % injection 3 mL  3 mL Intravenous PRN Bensimhon, Shaune Pascal, MD        Medications Prior to Admission  Medication Sig Dispense Refill Last Dose  . albuterol (PROVENTIL HFA;VENTOLIN HFA) 108 (90 Base) MCG/ACT inhaler Inhale 2 puffs into the lungs every 6 (six) hours as needed for wheezing or shortness of breath. 1 Inhaler 0 > 30 days`  . ALPRAZolam (XANAX) 1 MG tablet Take 1 tablet (1 mg total) by mouth at bedtime. 30 tablet 4 06/21/2018 at Unknown time  . aspirin 81 MG tablet Take 1 tablet (81 mg total) by mouth daily. 30 tablet 6 06/21/2018 at  Unknown time  . atorvastatin (LIPITOR) 10 MG tablet Take 1 tablet (10 mg total) by mouth daily. 90 tablet 2 06/21/2018 at Unknown time  . carvedilol (COREG) 12.5 MG tablet Take 1 tablet (12.5 mg total) by mouth 2 (two) times daily. 60 tablet 6 06/21/2018 at 2330  . cholecalciferol (VITAMIN D) 1000 units tablet Take 1,000 Units by mouth daily.   06/21/2018 at Unknown time  . clopidogrel (PLAVIX) 75 MG tablet Take 1 tablet (75 mg total) by mouth daily. 30 tablet 3 06/21/2018 at Unknown time  . cyclobenzaprine (FLEXERIL) 5 MG tablet Take 1 tablet (5 mg total) by mouth at bedtime. 30 tablet 4 06/21/2018 at Unknown time  . digoxin (LANOXIN) 0.125 MG tablet Take 1 tablet (0.125 mg total) by mouth every other day. 90 tablet 3 06/21/2018 at Unknown time  . fenofibrate (TRICOR) 145 MG tablet Take 1 tablet (145 mg total) by mouth daily. 90 tablet 3 06/21/2018 at Unknown time  . furosemide (LASIX) 20 MG tablet Take 20 mg by mouth daily.   06/21/2018 at Unknown time  . oxyCODONE (ROXICODONE) 15 MG immediate release tablet Take 15 mg by mouth every 8 (eight) hours.    06/21/2018 at Unknown time  . sacubitril-valsartan (ENTRESTO) 24-26 MG Take 1 tablet by mouth 2 (two) times daily. 60 tablet 11 06/21/2018 at Unknown time    Family History  Problem Relation Age of Onset  . Hepatitis Mother  60       HCV  . Liver cancer Mother 53  . Cancer Mother   . Diabetes Mother   . Heart disease Mother   . Hyperlipidemia Mother   . Stroke Sister 45       x3  . Diabetes Sister   . Hyperlipidemia Sister   . Dementia Father   . HIV Sister   . Diabetes Brother   . Heart disease Brother   . Hyperlipidemia Brother      Review of Systems:   ROS The patient tolerated general anesthesia for a cholecystectomy and rotator cuff repair without difficulty. The patient is right-hand dominant. Patient denies history of thoracic trauma. The patient does not think she was ever shocked by her AICD but states that at 1 Time She Did  Pass out. Patient denies history of GI bleeding but is never taken long-term Coumadin. She is currently an active smoker.   Cardiac Review of Systems: Y or  [    ]= no  Chest Pain [ y  ]  Resting SOB [  y ] Exertional SOB  [  ]  Orthopnea [  ]   Pedal Edema [   ]    Palpitations [  ] Syncope  Blue.Reese  ]   Presyncope [   ]  General Review of Systems: [Y] = yes [  ]=no Constitional: recent weight change [  ]; anorexia [  ]; fatigue y  ]; nausea [  ]; night sweats [  ]; fever [  ]; or chills [  ]                                                               Dental: Last Dentist visit: 1 year  Eye : blurred vision [  ]; diplopia [   ]; vision changes [  ];  Amaurosis fugax[  ]; Resp: cough [  ];  wheezing[  ];  hemoptysis[  ]; shortness of breath[  ]; paroxysmal nocturnal dyspnea[  ]; dyspnea on exertion[  ]; or orthopnea[  ];  GI:  gallstones[  ], vomiting[  ];  dysphagia[  ]; melena[  ];  hematochezia [  ]; heartburn[  ];   Hx of  Colonoscopy[ y, benign sigmoid colon polyp]; GU: kidney stones [  ]; hematuria[  ];   dysuria [  ];  nocturia[  ];  history of     obstruction [  ]; urinary frequency [  ]             Skin: rash, swelling[  ];, hair loss[  ];  peripheral edema[  ];  or itching[  ]; Musculosketetal: myalgias[y  ];  joint swelling[  ];  joint erythema[y  ];  joint pain[y  ];  back pain[  ];  Heme/Lymph: bruising[  ];  bleeding[  ];  anemia[  ];  Neuro: TIA[  ];  headaches[  ];  stroke[  ];  vertigo[  ];  seizures[  ];   paresthesias[  ];  difficulty walking[  ];  Psych:depression[  ]; anxiety[  ];  Endocrine: diabetes[  ];  thyroid dysfunction[  ];                  Physical Exam: BP 95/68  Pulse 100   Temp 98.7 F (37.1 C) (Oral)   Resp 15   Ht 5\' 6"  (1.676 m)   Wt 93.8 kg   SpO2 97%   BMI 33.38 kg/m        Physical Exam  General: Obese middle-aged AA female no acute distress HEENT: Normocephalic pupils equal , dentition adequate Neck: Supple without JVD, adenopathy, or  bruit Chest: Clear to auscultation, symmetrical breath sounds, no rhonchi, no tenderness             or deformity Cardiovascular: Regular rate and rhythm, no murmur, no gallop, peripheral pulses             palpable in all extremities Abdomen:  Soft, obese, nontender, no palpable mass or organomegaly Extremities: Warm, , no clubbing cyanosis  or tenderness, mild lower extremity edema but without               venous stasis changes of the legs Rectal/GU: Deferred Neuro: Grossly non--focal and symmetrical throughout Skin: Clean and dry without rash or ulceration   Diagnostic Studies & Laboratory data:     Recent Radiology Findings:  Chest x-ray shows cardiomegaly, CT scan of chest shows signs of heart failure minimal pleural effusions   I have independently reviewed the above radiologic studies and discussed with the patient   Recent Lab Findings: Lab Results  Component Value Date   WBC 5.6 06/25/2018   HGB 11.4 (L) 06/25/2018   HCT 37.3 06/25/2018   PLT 205 06/25/2018   GLUCOSE 110 (H) 06/25/2018   CHOL 91 06/22/2018   TRIG 94 06/22/2018   HDL 23 (L) 06/22/2018   LDLCALC 50 06/22/2018   ALT 11 06/22/2018   AST 19 06/22/2018   NA 138 06/25/2018   K 4.2 06/25/2018   CL 110 06/25/2018   CREATININE 0.83 06/25/2018   BUN 6 06/25/2018   CO2 22 06/25/2018   TSH 1.762 06/22/2018   INR 1.03 06/22/2018   HGBA1C 6.7 (H) 06/22/2018      Assessment / Plan:   Patient has severe LV dysfunction and although she has had recurrent significant CAD, her left ventricle would not tolerate CABG.  I doubt that PCI would make a significant improvement in her LV function or symptoms or prognosis.  She appears to be a potential candidate for possible VAD/mechanical cardiac support and I have recommended to her that she consider a full evaluation.  She is not convinced that she would want to pursue a VAD implant at this time but will consider.  I discussed the procedure of AICD implantation, the  postoperative recovery and hospitalization, and long-term issues that are part of being a VAD patient.  All questions were addressed.   will discuss the patient with the cardiac mechanical support team and hopefully follow her progress through the evaluation process.      @ME1 @ 06/25/2018 3:51 PM

## 2018-06-25 NOTE — Progress Notes (Signed)
ICM Remote Transmission rescheduled for 07/08/2018 due to patient hospitalized.

## 2018-06-26 DIAGNOSIS — I5023 Acute on chronic systolic (congestive) heart failure: Secondary | ICD-10-CM

## 2018-06-26 LAB — CBC
HCT: 36.7 % (ref 36.0–46.0)
HEMOGLOBIN: 11.1 g/dL — AB (ref 12.0–15.0)
MCH: 25.6 pg — ABNORMAL LOW (ref 26.0–34.0)
MCHC: 30.2 g/dL (ref 30.0–36.0)
MCV: 84.8 fL (ref 80.0–100.0)
NRBC: 0 % (ref 0.0–0.2)
Platelets: 239 10*3/uL (ref 150–400)
RBC: 4.33 MIL/uL (ref 3.87–5.11)
RDW: 13.4 % (ref 11.5–15.5)
WBC: 4.8 10*3/uL (ref 4.0–10.5)

## 2018-06-26 LAB — COMPREHENSIVE METABOLIC PANEL
ALK PHOS: 54 U/L (ref 38–126)
ALT: 22 U/L (ref 0–44)
AST: 68 U/L — AB (ref 15–41)
Albumin: 3.2 g/dL — ABNORMAL LOW (ref 3.5–5.0)
Anion gap: 3 — ABNORMAL LOW (ref 5–15)
BILIRUBIN TOTAL: 0.8 mg/dL (ref 0.3–1.2)
CALCIUM: 8.6 mg/dL — AB (ref 8.9–10.3)
CO2: 28 mmol/L (ref 22–32)
Chloride: 109 mmol/L (ref 98–111)
Creatinine, Ser: 0.83 mg/dL (ref 0.44–1.00)
GFR calc Af Amer: >60 mL/min (ref >60)
GLUCOSE: 107 mg/dL — AB (ref 70–99)
POTASSIUM: 3.9 mmol/L (ref 3.5–5.1)
Sodium: 140 mmol/L (ref 135–145)
TOTAL PROTEIN: 6.7 g/dL (ref 6.5–8.1)

## 2018-06-26 LAB — PROTIME-INR
INR: 1.09
PROTHROMBIN TIME: 14 s (ref 11.4–15.2)

## 2018-06-26 LAB — URIC ACID: Uric Acid, Serum: 6.8 mg/dL (ref 2.5–7.1)

## 2018-06-26 LAB — LACTATE DEHYDROGENASE: LDH: 407 U/L — ABNORMAL HIGH (ref 98–192)

## 2018-06-26 LAB — HIV ANTIBODY (ROUTINE TESTING W REFLEX): HIV Screen 4th Generation wRfx: NONREACTIVE

## 2018-06-26 LAB — ANTITHROMBIN III: AntiThromb III Func: 70 % — ABNORMAL LOW (ref 75–120)

## 2018-06-26 LAB — PLATELET INHIBITION P2Y12: Platelet Function  P2Y12: 146 [PRU] — ABNORMAL LOW (ref 194–418)

## 2018-06-26 LAB — T4, FREE: FREE T4: 1 ng/dL (ref 0.82–1.77)

## 2018-06-26 LAB — APTT: APTT: 34 s (ref 24–36)

## 2018-06-26 LAB — PREALBUMIN: PREALBUMIN: 8.9 mg/dL — AB (ref 18–38)

## 2018-06-26 MED ORDER — ENSURE ENLIVE PO LIQD
237.0000 mL | Freq: Three times a day (TID) | ORAL | Status: DC
  Administered 2018-06-26: 237 mL via ORAL

## 2018-06-26 NOTE — Progress Notes (Signed)
Advanced Heart Failure Rounding Note  PCP-Cardiologist: No primary care provider on file.   Subjective:    Denies CP or SOB. Now interested in pursuing VAD.    LHC 06/24/18  Dist LM lesion is 65% stenosed.  Prox RCA to Mid RCA lesion is 100% stenosed.  Ost Ramus lesion is 60% stenosed.  Non-stenotic Ramus lesion.  Prox LAD lesion is 70% stenosed.   Findings:  Ao = 79/58 (67) LV =  82/12  1. LM: Distal 60-70% lesion 2. LAD: 70% mid 3. Ramus: 60% prox 4. RCA 100% mid in stent with L -> R colalterals  EF 15% with severe global HK  Objective:   Weight Range: 93.8 kg Body mass index is 33.38 kg/m.   Vital Signs:   Temp:  [97.7 F (36.5 C)-98.7 F (37.1 C)] 97.7 F (36.5 C) (11/08 2052) Pulse Rate:  [81-100] 81 (11/08 2052) Resp:  [15-18] 18 (11/08 2052) BP: (95-117)/(68-77) 117/77 (11/08 2052) SpO2:  [97 %] 97 % (11/08 2052) Last BM Date: 06/25/18  Weight change: Filed Weights   06/22/18 2233 06/24/18 0510 06/25/18 0430  Weight: 95.3 kg 94.2 kg 93.8 kg    Intake/Output:   Intake/Output Summary (Last 24 hours) at 06/26/2018 0645 Last data filed at 06/25/2018 1900 Gross per 24 hour  Intake 930.13 ml  Output 2875 ml  Net -1944.87 ml      Physical Exam    General: sitting up in bed No resp difficulty HEENT: normal Neck: supple. JVP 7 Carotids 2+ bilat; no bruits. No lymphadenopathy or thryomegaly appreciated. Cor: PMI laterally displaced. Regular rate & rhythm. No rubs, gallops or murmurs. Lungs: clear Abdomen: soft, nontender, nondistended. No hepatosplenomegaly. No bruits or masses. Good bowel sounds. Extremities: no cyanosis, clubbing, rash, edema Neuro: alert & orientedx3, cranial nerves grossly intact. moves all 4 extremities w/o difficulty. Affect pleasant   Telemetry   NSR 80s with frequent PVCs, personally reviewed.   EKG    No new tracings.    Labs    CBC Recent Labs    06/25/18 0230 06/26/18 0357  WBC 5.6 4.8  HGB  11.4* 11.1*  HCT 37.3 36.7  MCV 86.3 84.8  PLT 205 939   Basic Metabolic Panel Recent Labs    06/24/18 0418 06/25/18 0230 06/26/18 0357  NA 140 138 140  K 4.2 4.2 3.9  CL 108 110 109  CO2 24 22 28   GLUCOSE 98 110* 107*  BUN 6 6 <5*  CREATININE 0.69 0.83 0.83  CALCIUM 8.9 8.2* 8.6*  MG 2.2  --   --    Liver Function Tests Recent Labs    06/26/18 0357  AST 68*  ALT 22  ALKPHOS 54  BILITOT 0.8  PROT 6.7  ALBUMIN 3.2*   No results for input(s): LIPASE, AMYLASE in the last 72 hours. Cardiac Enzymes Recent Labs    06/23/18 1103 06/23/18 1557 06/24/18 0902  TROPONINI <0.03 0.10* 30.28*    BNP: BNP (last 3 results) Recent Labs    12/25/17 0017 06/22/18 1526  BNP 113.0* 615.0*    ProBNP (last 3 results) No results for input(s): PROBNP in the last 8760 hours.   D-Dimer Recent Labs    06/24/18 0418  DDIMER 0.39   Hemoglobin A1C No results for input(s): HGBA1C in the last 72 hours. Fasting Lipid Panel No results for input(s): CHOL, HDL, LDLCALC, TRIG, CHOLHDL, LDLDIRECT in the last 72 hours. Thyroid Function Tests No results for input(s): TSH, T4TOTAL, T3FREE, THYROIDAB in  the last 72 hours.  Invalid input(s): FREET3  Other results:   Imaging    No results found.   Medications:     Scheduled Medications: . ALPRAZolam  1 mg Oral QHS  . aspirin EC  81 mg Oral Daily  . atorvastatin  40 mg Oral Daily  . carvedilol  12.5 mg Oral BID WC  . cholecalciferol  1,000 Units Oral Daily  . clopidogrel  75 mg Oral Daily  . digoxin  0.125 mg Oral QODAY  . enoxaparin (LOVENOX) injection  40 mg Subcutaneous Q24H  . fenofibrate  54 mg Oral Daily  . furosemide  20 mg Oral Daily  .  morphine injection  2 mg Intravenous Once  . potassium chloride  20 mEq Oral Daily  . sacubitril-valsartan  1 tablet Oral BID  . sodium chloride flush  3 mL Intravenous Q12H  . sodium chloride flush  3 mL Intravenous Q12H    Infusions: . sodium chloride    . sodium  chloride      PRN Medications: sodium chloride, sodium chloride, acetaminophen, albuterol, benzonatate, guaiFENesin-dextromethorphan, ondansetron (ZOFRAN) IV, sodium chloride flush, sodium chloride flush    Patient Profile   [name] is a 51 y.o. female with CAD s/p PCI to RCA 2011, mixed ICM/NICM NYHA II, ACC B (EF 20%, s/p ICD) c/b NSVT, SLE, GERD, and ongoing tobacco use who is admitted with hypotension and cough.   Assessment/Plan   1. NSTEMI in CAD - s/p remote stent. Last cath 2004. Peak trop 30.3 - Cath 06/24/18 with in-stent stenosis of mRCA stent and significant LM disease.  - Discussed with TCTS and interventional team. No candidate for CABG or PCI. Have decided to proceed with VAD - No further CP. Off heparin - Continue ASA and statin  2. Acute on chronic systolic CHF - Echo 7/34/19 LVEF 15-20%, Grade 2 DD, Mod MR, Severe LAE, Mod RV reduction - Volume status OK on exam.   - Continue coreg 12.5 mg BID - Continue Entrest 24/26 mg BID - Continue digoxin 0.125 mg daily. Level stable 06/22/18 - Cas d/w Dr. Prescott Gum and VAD coordinators.  - Patient now interested in VAD. W/u underway. Long discussion about the process this am  3. NSVT/VT - Managed by Dr. Caryl Comes. S/p MDT ICD.  - Intolerant to amiodarone and mexiletine - s/p ICD shock 12/2017 in setting of electrolytes. Can drive again this month.  - Continues to have frequent NSVT/PVCs, but no VT.  - ZIO patch 8-04/2018 with predominantly sinus rhythm with > 7900 runs of NSVT/VT, longest 30 seconds.  4. Tobacco abuse - Encouraged complete cessation. No change.   5. Protein calorie malnutrition - Prealbumin 8.9. Will order Ensure TID  Length of Stay: 1  Glori Bickers, MD  06/26/2018, 6:45 AM  Advanced Heart Failure Team Pager [phone] (M-F; 7a - 4p)  Please contact [facility] for night-coverage after hours (4p -7a ) and weekends on [email or web address]

## 2018-06-26 NOTE — Progress Notes (Signed)
Patient's BP is 85/58. Patient is asymptomatic, sitting in a chair eating supper. Afternoon coreg held per order parameters. Kathlen Mody, PA made aware.

## 2018-06-27 DIAGNOSIS — I5043 Acute on chronic combined systolic (congestive) and diastolic (congestive) heart failure: Secondary | ICD-10-CM

## 2018-06-27 LAB — CBC
HEMATOCRIT: 38.8 % (ref 36.0–46.0)
HEMOGLOBIN: 11.8 g/dL — AB (ref 12.0–15.0)
MCH: 25.9 pg — AB (ref 26.0–34.0)
MCHC: 30.4 g/dL (ref 30.0–36.0)
MCV: 85.1 fL (ref 80.0–100.0)
Platelets: 245 10*3/uL (ref 150–400)
RBC: 4.56 MIL/uL (ref 3.87–5.11)
RDW: 13.4 % (ref 11.5–15.5)
WBC: 5.6 10*3/uL (ref 4.0–10.5)
nRBC: 0 % (ref 0.0–0.2)

## 2018-06-27 LAB — HEPATITIS B SURFACE ANTIGEN: Hepatitis B Surface Ag: NEGATIVE

## 2018-06-27 LAB — BASIC METABOLIC PANEL
Anion gap: 6 (ref 5–15)
BUN: 9 mg/dL (ref 6–20)
CHLORIDE: 109 mmol/L (ref 98–111)
CO2: 25 mmol/L (ref 22–32)
Calcium: 8.9 mg/dL (ref 8.9–10.3)
Creatinine, Ser: 0.85 mg/dL (ref 0.44–1.00)
GFR calc non Af Amer: >60 mL/min (ref >60)
GLUCOSE: 124 mg/dL — AB (ref 70–99)
Potassium: 3.7 mmol/L (ref 3.5–5.1)
Sodium: 140 mmol/L (ref 135–145)

## 2018-06-27 LAB — LUPUS ANTICOAGULANT PANEL
DRVVT: 34.3 s (ref 0.0–47.0)
PTT LA: 40.9 s (ref 0.0–51.9)

## 2018-06-27 MED ORDER — CARVEDILOL 6.25 MG PO TABS
6.2500 mg | ORAL_TABLET | Freq: Two times a day (BID) | ORAL | Status: DC
  Administered 2018-06-27: 6.25 mg via ORAL
  Filled 2018-06-27 (×2): qty 1

## 2018-06-27 MED ORDER — CARVEDILOL 6.25 MG PO TABS: 6.2500 mg | ORAL_TABLET | Freq: Two times a day (BID) | ORAL | Status: DC

## 2018-06-27 MED ORDER — LOSARTAN POTASSIUM 25 MG PO TABS
12.5000 mg | ORAL_TABLET | Freq: Every day | ORAL | Status: DC
  Administered 2018-06-27: 12.5 mg via ORAL
  Filled 2018-06-27: qty 1

## 2018-06-27 MED ORDER — CARVEDILOL 6.25 MG PO TABS
6.2500 mg | ORAL_TABLET | Freq: Once | ORAL | Status: AC
  Administered 2018-06-27: 6.25 mg via ORAL
  Filled 2018-06-27: qty 1

## 2018-06-27 NOTE — Plan of Care (Signed)
  Problem: Clinical Measurements: Goal: Ability to maintain clinical measurements within normal limits will improve Outcome: Progressing   Problem: Clinical Measurements: Goal: Respiratory complications will improve Outcome: Progressing   Problem: Clinical Measurements: Goal: Cardiovascular complication will be avoided Outcome: Progressing   Problem: Pain Managment: Goal: General experience of comfort will improve Outcome: Progressing   

## 2018-06-27 NOTE — Progress Notes (Addendum)
Patient ID: [name], female   DOB: 09/30/1965, 52 y.o.   MRN: [id number]     Advanced Heart Failure Rounding Note  PCP-Cardiologist: No primary care provider on file.   Subjective:    No chest pain or dyspnea.  Wants to go home to deal with class work that she is behind on.  Cardiac meds are being held due to low BP, SBP has been in 80s-90s. She has had no lightheadedness.    LHC 06/24/18  Dist LM lesion is 65% stenosed.  Prox RCA to Mid RCA lesion is 100% stenosed.  Ost Ramus lesion is 60% stenosed.  Non-stenotic Ramus lesion.  Prox LAD lesion is 70% stenosed.   Findings:  Ao = 79/58 (67) LV =  82/12  1. LM: Distal 60-70% lesion 2. LAD: 70% mid 3. Ramus: 60% prox 4. RCA 100% mid in stent with L -> R colalterals  EF 15% with severe global HK  Objective:   Weight Range: 93.1 kg Body mass index is 33.12 kg/m.   Vital Signs:   Temp:  [98.3 F (36.8 C)-98.9 F (37.2 C)] 98.7 F (37.1 C) (11/10 0831) Pulse Rate:  [72-94] 94 (11/10 0831) Resp:  [15-31] 31 (11/10 0831) BP: (80-107)/(50-73) 80/60 (11/10 0831) SpO2:  [91 %-97 %] 97 % (11/10 0831) Weight:  [93.1 kg] 93.1 kg (11/10 0400) Last BM Date: 06/22/18  Weight change: Filed Weights   06/24/18 0510 06/25/18 0430 06/27/18 0400  Weight: 94.2 kg 93.8 kg 93.1 kg    Intake/Output:   Intake/Output Summary (Last 24 hours) at 06/27/2018 1201 Last data filed at 06/26/2018 2204 Gross per 24 hour  Intake 3 ml  Output 300 ml  Net -297 ml      Physical Exam    General: NAD Neck: No JVD, no thyromegaly or thyroid nodule.  Lungs: Clear to auscultation bilaterally with normal respiratory effort. CV: Nondisplaced PMI.  Heart regular S1/S2, no S3/S4, no murmur.  No peripheral edema.   Abdomen: Soft, nontender, no hepatosplenomegaly, no distention.  Skin: Intact without lesions or rashes.  Neurologic: Alert and oriented x 3.  Psych: Normal affect. Extremities: No clubbing or cyanosis.  HEENT:  Normal.   Telemetry   NSR 70s with frequent PVCs.    EKG    No new tracings.    Labs    CBC Recent Labs    06/26/18 0357 06/27/18 0515  WBC 4.8 5.6  HGB 11.1* 11.8*  HCT 36.7 38.8  MCV 84.8 85.1  PLT 239 144   Basic Metabolic Panel Recent Labs    06/26/18 0357 06/27/18 0515  NA 140 140  K 3.9 3.7  CL 109 109  CO2 28 25  GLUCOSE 107* 124*  BUN <5* 9  CREATININE 0.83 0.85  CALCIUM 8.6* 8.9   Liver Function Tests Recent Labs    06/26/18 0357  AST 68*  ALT 22  ALKPHOS 54  BILITOT 0.8  PROT 6.7  ALBUMIN 3.2*   No results for input(s): LIPASE, AMYLASE in the last 72 hours. Cardiac Enzymes No results for input(s): CKTOTAL, CKMB, CKMBINDEX, TROPONINI in the last 72 hours.  BNP: BNP (last 3 results) Recent Labs    12/25/17 0017 06/22/18 1526  BNP 113.0* 615.0*    ProBNP (last 3 results) No results for input(s): PROBNP in the last 8760 hours.   D-Dimer No results for input(s): DDIMER in the last 72 hours. Hemoglobin A1C No results for input(s): HGBA1C in the last 72 hours. Fasting Lipid Panel  No results for input(s): CHOL, HDL, LDLCALC, TRIG, CHOLHDL, LDLDIRECT in the last 72 hours. Thyroid Function Tests No results for input(s): TSH, T4TOTAL, T3FREE, THYROIDAB in the last 72 hours.  Invalid input(s): FREET3  Other results:   Imaging    No results found.   Medications:     Scheduled Medications: . ALPRAZolam  1 mg Oral QHS  . aspirin EC  81 mg Oral Daily  . atorvastatin  40 mg Oral Daily  . carvedilol  6.25 mg Oral BID WC  . cholecalciferol  1,000 Units Oral Daily  . clopidogrel  75 mg Oral Daily  . digoxin  0.125 mg Oral QODAY  . enoxaparin (LOVENOX) injection  40 mg Subcutaneous Q24H  . feeding supplement (ENSURE ENLIVE)  237 mL Oral TID WC  . fenofibrate  54 mg Oral Daily  . furosemide  20 mg Oral Daily  . losartan  12.5 mg Oral Daily  .  morphine injection  2 mg Intravenous Once  . potassium chloride  20 mEq Oral Daily    . sodium chloride flush  3 mL Intravenous Q12H  . sodium chloride flush  3 mL Intravenous Q12H    Infusions: . sodium chloride    . sodium chloride      PRN Medications: sodium chloride, sodium chloride, acetaminophen, albuterol, benzonatate, guaiFENesin-dextromethorphan, ondansetron (ZOFRAN) IV, sodium chloride flush, sodium chloride flush    Patient Profile   [name] is a 52 y.o. female with CAD s/p PCI to RCA 2011, mixed ICM/NICM NYHA II, ACC B (EF 20%, s/p ICD) c/b NSVT, SLE, GERD, and ongoing tobacco use who is admitted with hypotension and cough.   Assessment/Plan   1. NSTEMI in CAD - s/p remote stent. Last cath 2004. Peak trop 30.3 - Cath 06/24/18 with in-stent stenosis of mRCA stent and significant LM disease.  - Discussed with TCTS and interventional team. Not candidate for CABG or PCI. Have decided to proceed with VAD - No further CP. Off heparin - Continue ASA and statin  2. Acute on chronic systolic CHF - Echo 2/63/78 LVEF 15-20%, Grade 2 DD, Mod MR, Severe LAE, Mod RV reduction - Volume status OK on exam.   - She has not been getting Coreg due to low BP, will decrease Coreg to 6.25 mg bid and have it given this morning.  - She has not been getting Entresto due to low BP, stop Entresto and start losartan 12.5 mg daily.  - Continue digoxin 0.125 mg daily. Level ok 06/22/18  - Patient now interested in VAD. W/u underway. Patient concerned about timing, has a lot of classwork to do.  I told her that if she is going to get an LVAD, she will need to think about taking the rest of the semester off.  Will be able to talk to her regarding surgical timing after [facility] tomorrow.   3. NSVT/VT - Managed by Dr. Caryl Comes. S/p MDT ICD.  - Intolerant to amiodarone and mexiletine - s/p ICD shock 12/2017 in setting of electrolytes. Can drive again this month.  - Continues to have frequent NSVT/PVCs, but no VT.  - ZIO patch 8-04/2018 with predominantly sinus rhythm with > 7900  runs of NSVT/VT, longest 30 seconds. - Frequent PVCs noted on telemetry, has not been getting Coreg with low BP.  Will decrease dose to 6.25 mg bid and will give now, change hold parameters to < 85 SBP.   4. Tobacco abuse - Encouraged complete cessation. No change.   5.  Protein calorie malnutrition - Prealbumin 8.9. Will order Ensure TID  Length of Stay: 2  Loralie Champagne, MD  06/27/2018, 12:01 PM  Advanced Heart Failure Team Pager [phone] (M-F; 7a - 4p)  Please contact [facility] for night-coverage after hours (4p -7a ) and weekends on [email or web address]

## 2018-06-28 ENCOUNTER — Inpatient Hospital Stay (HOSPITAL_COMMUNITY): Payer: Medicare Other

## 2018-06-28 DIAGNOSIS — Z01810 Encounter for preprocedural cardiovascular examination: Secondary | ICD-10-CM

## 2018-06-28 LAB — BASIC METABOLIC PANEL
Anion gap: 5 (ref 5–15)
BUN: 7 mg/dL (ref 6–20)
CALCIUM: 8.9 mg/dL (ref 8.9–10.3)
CHLORIDE: 107 mmol/L (ref 98–111)
CO2: 27 mmol/L (ref 22–32)
Creatinine, Ser: 0.85 mg/dL (ref 0.44–1.00)
GFR calc non Af Amer: >60 mL/min (ref >60)
Glucose, Bld: 112 mg/dL — ABNORMAL HIGH (ref 70–99)
Potassium: 3.8 mmol/L (ref 3.5–5.1)
SODIUM: 139 mmol/L (ref 135–145)

## 2018-06-28 LAB — LIPID PANEL
CHOLESTEROL: 92 mg/dL (ref 0–200)
HDL: 23 mg/dL — AB (ref >40)
LDL Cholesterol: 49 mg/dL (ref 0–99)
TRIGLYCERIDES: 102 mg/dL (ref <150)
Total CHOL/HDL Ratio: 4 RATIO
VLDL: 20 mg/dL (ref 0–40)

## 2018-06-28 LAB — BLOOD GAS, ARTERIAL
Acid-Base Excess: 1.3 mmol/L (ref 0.0–2.0)
Bicarbonate: 25.1 mmol/L (ref 20.0–28.0)
Drawn by: 275531
O2 Saturation: 93.1 %
PATIENT TEMPERATURE: 98.6
PH ART: 7.436 (ref 7.350–7.450)
pCO2 arterial: 38 mmHg (ref 32.0–48.0)
pO2, Arterial: 65.7 mmHg — ABNORMAL LOW (ref 83.0–108.0)

## 2018-06-28 LAB — CBC
HEMATOCRIT: 38.5 % (ref 36.0–46.0)
HEMOGLOBIN: 12.1 g/dL (ref 12.0–15.0)
MCH: 26.4 pg (ref 26.0–34.0)
MCHC: 31.4 g/dL (ref 30.0–36.0)
MCV: 84.1 fL (ref 80.0–100.0)
Platelets: 244 10*3/uL (ref 150–400)
RBC: 4.58 MIL/uL (ref 3.87–5.11)
RDW: 13.4 % (ref 11.5–15.5)
WBC: 6.3 10*3/uL (ref 4.0–10.5)
nRBC: 0 % (ref 0.0–0.2)

## 2018-06-28 MED ORDER — BOOST / RESOURCE BREEZE PO LIQD CUSTOM
1.0000 | Freq: Two times a day (BID) | ORAL | Status: DC
  Administered 2018-06-28: 1 via ORAL

## 2018-06-28 MED ORDER — LOSARTAN POTASSIUM 25 MG PO TABS
12.5000 mg | ORAL_TABLET | Freq: Every day | ORAL | Status: DC
  Administered 2018-06-28 – 2018-06-29 (×2): 12.5 mg via ORAL
  Filled 2018-06-28 (×2): qty 1

## 2018-06-28 MED ORDER — SODIUM CHLORIDE 0.9 % IV SOLN
INTRAVENOUS | Status: DC
  Administered 2018-06-29: 06:00:00 via INTRAVENOUS

## 2018-06-28 MED ORDER — ADULT MULTIVITAMIN W/MINERALS CH
1.0000 | ORAL_TABLET | Freq: Every day | ORAL | Status: DC
  Administered 2018-06-28 – 2018-06-30 (×2): 1 via ORAL
  Filled 2018-06-28 (×2): qty 1

## 2018-06-28 MED ORDER — IOHEXOL 300 MG/ML  SOLN
100.0000 mL | Freq: Once | INTRAMUSCULAR | Status: AC | PRN
  Administered 2018-06-28: 100 mL via INTRAVENOUS

## 2018-06-28 MED ORDER — CARVEDILOL 3.125 MG PO TABS
3.1250 mg | ORAL_TABLET | Freq: Two times a day (BID) | ORAL | Status: DC
  Administered 2018-06-28 – 2018-06-30 (×4): 3.125 mg via ORAL
  Filled 2018-06-28 (×4): qty 1

## 2018-06-28 MED ORDER — METRONIDAZOLE 500 MG PO TABS
2000.0000 mg | ORAL_TABLET | Freq: Once | ORAL | Status: AC
  Administered 2018-06-28: 2000 mg via ORAL
  Filled 2018-06-28: qty 4

## 2018-06-28 NOTE — Progress Notes (Addendum)
Patient ID: [name], female   DOB: 1965-12-12, 52 y.o.   MRN: [id number]     Advanced Heart Failure Rounding Note  PCP-Cardiologist: No primary care provider on file.   Subjective:    Feeling OK this am. Frustrated to still be in hospital and wants to go home. She understands the importance of LVAD work up while already here.   BP cuff 73/59 this am. Checked manually, personally with systolic BP of 92 and good radial pulses. With frequent PVCs BP has run low.  Will adjust meds and add parameters. She is not dizzy or lightheaded.   LHC 06/24/18  Dist LM lesion is 65% stenosed.  Prox RCA to Mid RCA lesion is 100% stenosed.  Ost Ramus lesion is 60% stenosed.  Non-stenotic Ramus lesion.  Prox LAD lesion is 70% stenosed.   Findings:  Ao = 79/58 (67) LV =  82/12  1. LM: Distal 60-70% lesion 2. LAD: 70% mid 3. Ramus: 60% prox 4. RCA 100% mid in stent with L -> R colalterals  EF 15% with severe global HK  Objective:   Weight Range: 92.9 kg Body mass index is 33.07 kg/m.   Vital Signs:   Temp:  [97.9 F (36.6 C)-98.7 F (37.1 C)] 98.1 F (36.7 C) (11/11 0500) Pulse Rate:  [30-86] 85 (11/11 0829) Resp:  [10-32] 32 (11/10 2345) BP: (73-108)/(57-82) 73/59 (11/11 0829) SpO2:  [90 %-98 %] 95 % (11/11 0500) Weight:  [92.9 kg] 92.9 kg (11/11 0500) Last BM Date: 06/27/18  Weight change: Filed Weights   06/25/18 0430 06/27/18 0400 06/28/18 0500  Weight: 93.8 kg 93.1 kg 92.9 kg    Intake/Output:   Intake/Output Summary (Last 24 hours) at 06/28/2018 0832 Last data filed at 06/27/2018 2140 Gross per 24 hour  Intake 963 ml  Output 950 ml  Net 13 ml     Physical Exam    General:  Well appearing. No resp difficulty HEENT: normal Neck: supple. no JVD. Carotids 2+ bilat; no bruits. No lymphadenopathy or thryomegaly appreciated. Cor: PMI laterally displaced. Regular rate & rhythm.  + ectopy No rubs, gallops or murmurs. Lungs: clear Abdomen: soft,  nontender, nondistended. No hepatosplenomegaly. No bruits or masses. Good bowel sounds. Extremities: no cyanosis, clubbing, rash, edema Neuro: alert & orientedx3, cranial nerves grossly intact. moves all 4 extremities w/o difficulty. Affect pleasant   Telemetry   NSR 80s with PVCs, personally reviewed.   EKG   No new tracings.    Labs    CBC Recent Labs    06/27/18 0515 06/28/18 0640  WBC 5.6 6.3  HGB 11.8* 12.1  HCT 38.8 38.5  MCV 85.1 84.1  PLT 245 030   Basic Metabolic Panel Recent Labs    06/27/18 0515 06/28/18 0640  NA 140 139  K 3.7 3.8  CL 109 107  CO2 25 27  GLUCOSE 124* 112*  BUN 9 7  CREATININE 0.85 0.85  CALCIUM 8.9 8.9   Liver Function Tests Recent Labs    06/26/18 0357  AST 68*  ALT 22  ALKPHOS 54  BILITOT 0.8  PROT 6.7  ALBUMIN 3.2*   No results for input(s): LIPASE, AMYLASE in the last 72 hours. Cardiac Enzymes No results for input(s): CKTOTAL, CKMB, CKMBINDEX, TROPONINI in the last 72 hours.  BNP: BNP (last 3 results) Recent Labs    12/25/17 0017 06/22/18 1526  BNP 113.0* 615.0*   ProBNP (last 3 results) No results for input(s): PROBNP in the last 8760 hours.  D-Dimer No results for input(s): DDIMER in the last 72 hours. Hemoglobin A1C No results for input(s): HGBA1C in the last 72 hours. Fasting Lipid Panel No results for input(s): CHOL, HDL, LDLCALC, TRIG, CHOLHDL, LDLDIRECT in the last 72 hours. Thyroid Function Tests No results for input(s): TSH, T4TOTAL, T3FREE, THYROIDAB in the last 72 hours.  Invalid input(s): FREET3  Other results:  Imaging   No results found.  Medications:    Scheduled Medications: . ALPRAZolam  1 mg Oral QHS  . aspirin EC  81 mg Oral Daily  . atorvastatin  40 mg Oral Daily  . carvedilol  6.25 mg Oral BID WC  . cholecalciferol  1,000 Units Oral Daily  . clopidogrel  75 mg Oral Daily  . digoxin  0.125 mg Oral QODAY  . enoxaparin (LOVENOX) injection  40 mg Subcutaneous Q24H  . feeding  supplement (ENSURE ENLIVE)  237 mL Oral TID WC  . fenofibrate  54 mg Oral Daily  . furosemide  20 mg Oral Daily  . losartan  12.5 mg Oral Daily  .  morphine injection  2 mg Intravenous Once  . potassium chloride  20 mEq Oral Daily  . sodium chloride flush  3 mL Intravenous Q12H  . sodium chloride flush  3 mL Intravenous Q12H    Infusions: . sodium chloride    . sodium chloride      PRN Medications: sodium chloride, sodium chloride, acetaminophen, albuterol, benzonatate, guaiFENesin-dextromethorphan, ondansetron (ZOFRAN) IV, sodium chloride flush, sodium chloride flush  Patient Profile   [name] is a 52 y.o. female with CAD s/p PCI to RCA 2011, mixed ICM/NICM NYHA II, ACC B (EF 20%, s/p ICD) c/b NSVT, SLE, GERD, and ongoing tobacco use who is admitted with hypotension and cough.   Assessment/Plan   1. NSTEMI in CAD - s/p remote stent. Last cath 2004. Peak trop 30.3 - Cath 06/24/18 with in-stent stenosis of mRCA stent and significant LM disease.  - Discussed with TCTS and interventional team. Not candidate for CABG or PCI. Have decided to proceed with VAD - No further CP. Off heparin.  - Continue ASA and statin  2. Acute on chronic systolic CHF - Echo 1/63/84 LVEF 15-20%, Grade 2 DD, Mod MR, Severe LAE, Mod RV reduction - Volume status OK to dry.  - Coreg has been held due to low BP. Will try at 3.125 mg BID starting this evening if stable. BP low this am.  - BP has been too low to tolerate Entresto. Now ordered for losartan 12.5 mg qhs.  - Continue digoxin 0.125 mg daily. Level ok 06/22/18  - Patient now interested in VAD. W/u underway. Patient concerned about timing, has a lot of classwork to do.  She is aware, that if she is going to get an LVAD, she will need to think about taking the rest of the semester off. - Will discuss at [facility] today.   3. NSVT/VT - Managed by Dr. Caryl Comes. S/p MDT ICD.  - Intolerant to amiodarone and mexiletine - s/p ICD shock 12/2017 in  setting of electrolytes. Can drive again this month.  - Continues to have frequent NSVT/PVCs, but no VT.  - ZIO patch 8-04/2018 with predominantly sinus rhythm with > 7900 runs of NSVT/VT, longest 30 seconds. - Frequent PVCs noted on telemetry, has not been getting Coreg with low BP.   - Decrease coreg to 3.125 mg BID with continue hypotension.   4. Tobacco abuse - Encouraged complete cessation. No change.  5. Protein calorie malnutrition - Prealbumin 8.9. Continue Ensure TID  Length of Stay: 3  Annamaria Helling  06/28/2018, 8:32 AM  Advanced Heart Failure Team Pager [phone] (M-F; 7a - 4p)  Please contact [facility] for night-coverage after hours (4p -7a ) and weekends on [email or web address]   Patient seen and examined with the above-signed Advanced Practice Provider and/or Housestaff. I personally reviewed laboratory data, imaging studies and relevant notes. I independently examined the patient and formulated the important aspects of the plan. I have edited the note to reflect any of my changes or salient points. I have personally discussed the plan with the patient and/or family.  Volume status currently looks good. No angina. Has met with VAD coordinators and SW. Would like to proceed with VAD if possible. Will discuss further today at [facility].   Glori Bickers, MD  2:44 PM

## 2018-06-28 NOTE — Progress Notes (Signed)
[facility] EDUCATION NOTE:   VAD evaluation consent reviewed and signed by [name]. Her daughter [name] who will be her care giver is not present at the bedside currently. Initial VAD teaching completed with patient and daughter via Facetime.   Patient is not currently employed, but is in school pursuing a psychology degree. She is set to graduate in March. She is currently restricted from driving per Dr. Caryl Day, due to a syncopal episode she had in May.   VAD educational packet including "Understanding your Options with Heart Failure," VAD patient education pamphlet, LVAD "Exploring Options" booklet, and LVAD educational DVD reviewed in detail with me and left at bedside for continued reference.  All questions answered regarding VAD implant, hospital stay, and what to expect when discharged home living with a heart pump. Pt identified [name] (daughter) as her primary caregiver. Explained need for 24/7 care when pt is discharged home due to sternal precautions, adaptation to living on support, emotional support, consistent and meticulous exit site care and management, medication adherence and high volume of follow up visits with the [facility] after discharge;  both pt and caregiver verbalized understanding of above.   Explained that LVAD can be implanted for two indications in the setting of advanced left ventricular heart failure treatment:  1. Bridge to transplant - used for patients who cannot safely wait for heart transplant without this device.  Or   2. Destination therapy - used for patients until end of life or recovery of heart function.  Patient and caregiver(s) acknowledge that the indication at this point in time for LVAD therapy would be for destination therapy due to smoking status.   Provided brief equipment overview and demonstration with Heartmate III training loop including discussion on the following:  a) power module  b) system controller  c) universal Charity fundraiser   d) battery clips  e) Batteries  f) Perc lock  g) Percutaneous lead    Reviewed and supplied a copy of home inspection check list stressing that only three pronged grounded power outlets can be used for VAD equipment. [name] confirmed home has electrical outlets that will support the equipment in patient's bedroom.   Identified the following lifestyle modifications while living on MCS:   1. No driving for at least three months and then only if doctor gives permission to do so.  2. No tub baths while pump implanted, and shower only when doctor gives permission.  3. No swimming or submersion in water while implanted with pump.  4. No contact sports or engaging in jumping activities.  5. Always have a backup controller, charged spare batteries, and battery clips nearby at all times in case of emergency.  6. Call the doctor or hospital contact person if any change in how the pump sounds, feels, or works.  7. Plan to sleep only when connected to the power module.  8. Do not sleep on your stomach.  9. Keep a backup system controller, charged batteries, battery clips, and flashlight near you during sleep in case of electrical power outage.  10. Exit site care including dressing changes, monitoring for infection, and importance of keeping percutaneous lead stabilized at all times.    Extended the option to have one of our current patients and caregiver(s) come to talk with them about living on support to assist with decision making.  She says that she has spoken with a friend of hers who is a current VAD patient, regarding his experience. She is interested in speaking with a  female VAD patient.     Reinforced need for 24 hour/7 day week caregivers; pt designated [name] (her youngest daughter) as caregiver. She will also need to abide by sternal precautions with no lifting >10lbs, pushing, pulling and will need assistance with adapting to new life style with VAD equipment and care.    The patient understands that from this discussion it does not mean that they will receive the device, but that depends on an extensive evaluation process. The patient is aware of the fact that if at anytime they want to stop the evaluation process they can.  All questions have been answered at this time and contact information was provided should they encounter any further questions.  They are both agreeable at this time to the evaluation process and will move forward.    Emerson Monte RN [place] Coordinator  Office: [phone]  24/7 Pager: [phone]

## 2018-06-28 NOTE — Progress Notes (Signed)
Initial Nutrition Assessment  DOCUMENTATION CODES:   Obesity unspecified  INTERVENTION:    Boost Breeze po BID, each supplement provides 250 kcal and 9 grams of protein  Multivitamin daily  NUTRITION DIAGNOSIS:   Increased nutrient needs related to chronic illness(CAD, HF with LVAD eval ongoing) as evidenced by estimated needs.  GOAL:   Patient will meet greater than or equal to 90% of their needs  MONITOR:   PO intake, Supplement acceptance, Labs  REASON FOR ASSESSMENT:   Consult LVAD Eval  ASSESSMENT:   52 yo female with PMH of CAD, CHF, cardiomyopathy, GERD, lupus, smoker, MI x 8, V fib, and chronic pain who was admitted on 11/5 with acute on chronic decompensated systolic HF, NSVT, NSTEMI.   Patient ready to go home. She wants to start her classes today. Heart Failure team to discuss patient at [facility] today.  Patient usually eats one meal per day at 8 pm. She likes fried foods and doesn't follow a particular diet. She has not noticed any weight loss. Intake of meals since admission has been good when she likes the food, but poor when she doesn't like the food. She dislikes lunch and dinner most days. Encouraged patient to follow a heart healthy diet with 3 meals/snacks per day when discharged home.   RD provided "Heart Healthy Nutrition Therapy" handout from the Academy of Nutrition and Dietetics. Provided examples on ways to decrease sodium and fat intake in diet. Discouraged intake of processed foods and use of salt shaker. Encouraged fresh fruits and vegetables as well as whole grain sources of carbohydrates to maximize fiber intake. Teach back method used.  Expect fair compliance. Patient was not too enthused with the idea of following a diet, but agreed to receive education.   Body mass index is 33.07 kg/m. Pt meets criteria for obesity class 1 based on current BMI.  Labs reviewed. Medications reviewed and include vitamin D, lasix, KCl.   NUTRITION - FOCUSED  PHYSICAL EXAM:    Most Recent Value  Orbital Region  No depletion  Upper Arm Region  No depletion  Thoracic and Lumbar Region  No depletion  Buccal Region  No depletion  Temple Region  No depletion  Clavicle Bone Region  No depletion  Clavicle and Acromion Bone Region  No depletion  Scapular Bone Region  No depletion  Dorsal Hand  No depletion  Patellar Region  No depletion  Anterior Thigh Region  No depletion  Posterior Calf Region  No depletion  Edema (RD Assessment)  None  Hair  Reviewed  Eyes  Reviewed  Mouth  Reviewed  Skin  Reviewed  Nails  Reviewed       Diet Order:   Diet Order            Diet Heart Room service appropriate? Yes; Fluid consistency: Thin  Diet effective now        Diet - low sodium heart healthy              EDUCATION NEEDS:   Education needs have been addressed  Skin:  Skin Assessment: Reviewed RN Assessment  Last BM:  11/10  Height:   Ht Readings from Last 1 Encounters:  06/22/18 5\' 6"  (1.676 m)    Weight:   Wt Readings from Last 1 Encounters:  06/28/18 92.9 kg    Ideal Body Weight:  59.1 kg  BMI:  Body mass index is 33.07 kg/m.  Estimated Nutritional Needs:   Kcal:  1700-1900  Protein:  90-100  gm  Fluid:  1.8 L    Molli Barrows, RD, LDN, [place] Pager [phone] After Hours Pager [phone]

## 2018-06-28 NOTE — Progress Notes (Signed)
Pre-op Cardiac Surgery  Carotid Findings:  There is no evidence of significant ICA stenosis bilaterally. Vertebral artery flow is antegrade bilaterally.  Upper Extremity Right Left  Brachial Pressures 96 Triphasic Unable to obtain due to IV placement. Triphasic    Lower  Extremity Right Left  Dorsalis Pedis 113 Triphasic 100 Biphasic  Posterior Tibial 103 Triphasic 100 Biphasic  Ankle/Brachial Indices 1.18 1.04   Doppler waveforms were slightly difficult to evaluate bilaterally due to cardiac arrhythmia  Findings:  ABIs are within normal limits bilaterally at rest.  Toma Copier, RVS 06/28/2018 5:38 PM

## 2018-06-28 NOTE — Progress Notes (Signed)
Bilateral lower extremity venous duplex completed - Preliminary results - There is no evidence of a DVT or Baker's cyst. Toma Copier, RVS 06/28/2018 5:12 PM

## 2018-06-29 ENCOUNTER — Encounter (HOSPITAL_COMMUNITY)
Admission: EM | Disposition: A | Discharge status: Home / Self Care | Payer: Self-pay | Source: Home / Self Care | Attending: Internal Medicine

## 2018-06-29 ENCOUNTER — Inpatient Hospital Stay (HOSPITAL_COMMUNITY): Payer: Medicare Other

## 2018-06-29 ENCOUNTER — Encounter (HOSPITAL_COMMUNITY): Payer: Self-pay | Admitting: Internal Medicine

## 2018-06-29 DIAGNOSIS — R160 Hepatomegaly, not elsewhere classified: Secondary | ICD-10-CM

## 2018-06-29 HISTORY — PX: RIGHT HEART CATH: CATH118263

## 2018-06-29 LAB — HEPATITIS B CORE ANTIBODY, IGM: Hep B C IgM: NEGATIVE

## 2018-06-29 LAB — POCT I-STAT 3, VENOUS BLOOD GAS (G3P V)
Acid-Base Excess: 1 mmol/L (ref 0.0–2.0)
BICARBONATE: 26.6 mmol/L (ref 20.0–28.0)
Bicarbonate: 25.8 mmol/L (ref 20.0–28.0)
Bicarbonate: 25.9 mmol/L (ref 20.0–28.0)
Bicarbonate: 24.9 mmol/L (ref 20.0–28.0)
O2 Saturation: 46 %
O2 Saturation: 56 %
O2 Saturation: 45 %
O2 Saturation: 48 %
PCO2 VEN: 42.6 mmHg — AB (ref 44.0–60.0)
PCO2 VEN: 44 mmHg (ref 44.0–60.0)
PCO2 VEN: 45.9 mmHg (ref 44.0–60.0)
PH VEN: 7.372 (ref 7.250–7.430)
PH VEN: 7.373 (ref 7.250–7.430)
PH VEN: 7.377 (ref 7.250–7.430)
PO2 VEN: 27 mmHg — AB (ref 32.0–45.0)
PO2 VEN: 30 mmHg — AB (ref 32.0–45.0)
TCO2: 27 mmol/L (ref 22–32)
TCO2: 27 mmol/L (ref 22–32)
TCO2: 26 mmol/L (ref 22–32)
TCO2: 28 mmol/L (ref 22–32)
pCO2, Ven: 44.4 mmHg (ref 44.0–60.0)
pH, Ven: 7.375 (ref 7.250–7.430)
pO2, Ven: 26 mmHg — CL (ref 32.0–45.0)
pO2, Ven: 26 mmHg — CL (ref 32.0–45.0)

## 2018-06-29 LAB — PULMONARY FUNCTION TEST
FEF 25-75 Pre: 1.74 L/sec
FEF2575-%Pred-Pre: 71 %
FEV1-%Pred-Pre: 61 %
FEV1-Pre: 1.49 L
FEV1FVC-%Pred-Pre: 106 %
FEV6-%Pred-Pre: 58 %
FEV6-Pre: 1.73 L
FEV6FVC-%PRED-PRE: 102 %
FVC-%PRED-PRE: 56 %
FVC-PRE: 1.73 L
PRE FEV6/FVC RATIO: 100 %
Pre FEV1/FVC ratio: 86 %

## 2018-06-29 LAB — CBC
HEMATOCRIT: 35.8 % — AB (ref 36.0–46.0)
HEMOGLOBIN: 11.3 g/dL — AB (ref 12.0–15.0)
MCH: 26.5 pg (ref 26.0–34.0)
MCHC: 31.6 g/dL (ref 30.0–36.0)
MCV: 83.8 fL (ref 80.0–100.0)
Platelets: 232 10*3/uL (ref 150–400)
RBC: 4.27 MIL/uL (ref 3.87–5.11)
RDW: 13.4 % (ref 11.5–15.5)
WBC: 5.1 10*3/uL (ref 4.0–10.5)
nRBC: 0 % (ref 0.0–0.2)

## 2018-06-29 LAB — BASIC METABOLIC PANEL
ANION GAP: 8 (ref 5–15)
BUN: 7 mg/dL (ref 6–20)
CALCIUM: 8.4 mg/dL — AB (ref 8.9–10.3)
CO2: 21 mmol/L — ABNORMAL LOW (ref 22–32)
Chloride: 106 mmol/L (ref 98–111)
Creatinine, Ser: 0.81 mg/dL (ref 0.44–1.00)
GFR calc Af Amer: >60 mL/min (ref >60)
GFR calc non Af Amer: >60 mL/min (ref >60)
GLUCOSE: 104 mg/dL — AB (ref 70–99)
POTASSIUM: 3.7 mmol/L (ref 3.5–5.1)
Sodium: 135 mmol/L (ref 135–145)

## 2018-06-29 LAB — HEPATITIS C ANTIBODY: HCV AB: 0.1 {s_co_ratio} (ref 0.0–0.9)

## 2018-06-29 LAB — ANTI-DNA ANTIBODY, DOUBLE-STRANDED: ds DNA Ab: 4 IU/mL (ref 0–9)

## 2018-06-29 LAB — HEPATITIS B SURFACE ANTIBODY, QUANTITATIVE

## 2018-06-29 SURGERY — RIGHT HEART CATH
Anesthesia: LOCAL

## 2018-06-29 MED ORDER — HEPARIN (PORCINE) IN NACL 1000-0.9 UT/500ML-% IV SOLN
INTRAVENOUS | Status: DC | PRN
  Administered 2018-06-29: 500 mL

## 2018-06-29 MED ORDER — SODIUM CHLORIDE 0.9 % IV SOLN
INTRAVENOUS | Status: AC | PRN
  Administered 2018-06-29: 10 mL/h via INTRAVENOUS

## 2018-06-29 MED ORDER — LIDOCAINE HCL (PF) 1 % IJ SOLN
INTRAMUSCULAR | Status: DC | PRN
  Administered 2018-06-29: 5 mL

## 2018-06-29 MED ORDER — HEPARIN (PORCINE) IN NACL 1000-0.9 UT/500ML-% IV SOLN
INTRAVENOUS | Status: AC
  Filled 2018-06-29: qty 1000

## 2018-06-29 MED ORDER — MIDAZOLAM HCL 2 MG/2ML IJ SOLN
INTRAMUSCULAR | Status: DC | PRN
  Administered 2018-06-29: 1 mg via INTRAVENOUS

## 2018-06-29 MED ORDER — SODIUM CHLORIDE 0.9 % IV SOLN: 250.0000 mL | INTRAVENOUS | Status: DC | PRN

## 2018-06-29 MED ORDER — MIDAZOLAM HCL 2 MG/2ML IJ SOLN
INTRAMUSCULAR | Status: AC
  Filled 2018-06-29: qty 2

## 2018-06-29 MED ORDER — LIDOCAINE HCL (PF) 1 % IJ SOLN
INTRAMUSCULAR | Status: AC
  Filled 2018-06-29: qty 30

## 2018-06-29 MED ORDER — SODIUM CHLORIDE 0.9% FLUSH: 3.0000 mL | INTRAVENOUS | Status: DC | PRN

## 2018-06-29 MED ORDER — FENTANYL CITRATE (PF) 100 MCG/2ML IJ SOLN
INTRAMUSCULAR | Status: AC
  Filled 2018-06-29: qty 2

## 2018-06-29 MED ORDER — ACETAMINOPHEN 325 MG PO TABS: 650.0000 mg | ORAL_TABLET | ORAL | Status: DC | PRN

## 2018-06-29 MED ORDER — FENTANYL CITRATE (PF) 100 MCG/2ML IJ SOLN
INTRAMUSCULAR | Status: DC | PRN
  Administered 2018-06-29: 25 ug via INTRAVENOUS

## 2018-06-29 MED ORDER — SODIUM CHLORIDE 0.9% FLUSH: 3.0000 mL | Freq: Two times a day (BID) | INTRAVENOUS | Status: DC

## 2018-06-29 MED ORDER — ASPIRIN 81 MG PO CHEW
81.0000 mg | CHEWABLE_TABLET | Freq: Once | ORAL | Status: AC
  Administered 2018-06-29: 81 mg via ORAL
  Filled 2018-06-29: qty 1

## 2018-06-29 MED ORDER — ONDANSETRON HCL 4 MG/2ML IJ SOLN: 4.0000 mg | Freq: Four times a day (QID) | INTRAMUSCULAR | Status: DC | PRN

## 2018-06-29 MED ORDER — ENOXAPARIN SODIUM 40 MG/0.4ML ~~LOC~~ SOLN: 40.0000 mg | SUBCUTANEOUS | Status: DC

## 2018-06-29 SURGICAL SUPPLY — 7 items
CATH BALLN WEDGE 5F 110CM (CATHETERS) ×1 IMPLANT
GUIDEWIRE .025 260CM (WIRE) ×1 IMPLANT
KIT HEART LEFT (KITS) ×1 IMPLANT
PACK CARDIAC CATHETERIZATION (CUSTOM PROCEDURE TRAY) ×2 IMPLANT
SHEATH GLIDE SLENDER 4/5FR (SHEATH) ×1 IMPLANT
SHEATH PROBE COVER 6X72 (BAG) ×1 IMPLANT
TRANSDUCER W/STOPCOCK (MISCELLANEOUS) ×2 IMPLANT

## 2018-06-29 NOTE — H&P (View-Only) (Signed)
Patient ID: Roniya Tetro, female   DOB: Mar 11, 1966, 52 y.o.   MRN: [id number]     Advanced Heart Failure Rounding Note  PCP-Cardiologist: No primary care provider on file.   Subjective:    Discussed at [facility] 06/28/18. Approved to move forward with LVAD work up.   Feeling OK this am. Anxious to go home. Denies lightheadedness or dizziness. Auto cuff not picking up BP well. Daughter who is present thinks it is a bad idea for patient to go home prior to surgery.    Cr 0.81. PFTs performed at bedside this am due to left main disease. Does not include DLCO.   CT today with possible liver mass.   PFTs 06/29/18 FVC 1.73 (56%) FEV1 1.49 (61%)  LHC 06/24/18  Dist LM lesion is 65% stenosed.  Prox RCA to Mid RCA lesion is 100% stenosed.  Ost Ramus lesion is 60% stenosed.  Non-stenotic Ramus lesion.  Prox LAD lesion is 70% stenosed.   Findings:  Ao = 79/58 (67) LV =  82/12  1. LM: Distal 60-70% lesion 2. LAD: 70% mid 3. Ramus: 60% prox 4. RCA 100% mid in stent with L -> R colalterals  EF 15% with severe global HK  Objective:   Weight Range: 93 kg Body mass index is 33.09 kg/m.   Vital Signs:   Temp:  [97.9 F (36.6 C)-98.3 F (36.8 C)] 98 F (36.7 C) (11/12 0822) Pulse Rate:  [79-92] 82 (11/12 0822) Resp:  [20-25] 25 (11/12 0822) BP: (85-108)/(56-90) 85/65 (11/12 0822) SpO2:  [92 %-99 %] 94 % (11/12 0822) Weight:  [93 kg] 93 kg (11/12 0417) Last BM Date: 06/27/18  Weight change: Filed Weights   06/27/18 0400 06/28/18 0500 06/29/18 0417  Weight: 93.1 kg 92.9 kg 93 kg    Intake/Output:   Intake/Output Summary (Last 24 hours) at 06/29/2018 0847 Last data filed at 06/29/2018 0626 Gross per 24 hour  Intake 1083 ml  Output 1252 ml  Net -169 ml     Physical Exam    General: No resp difficulty. HEENT: Normal anicteric  Neck: Supple. JVP 6-7 cm. Carotids 2+ bilat; no bruits. No thyromegaly or nodule noted. Cor: PMI laterally displaced. Irregular  due to frequent ectopy, No M/G/R noted Lungs: CTAB, normal effort. No wheeze  Abdomen: Soft, non-tender, non-distended, no HSM. No bruits or masses. +BS  Extremities: no cyanosis, clubbing, rash, edema Neuro: alert & oriented x 3, cranial nerves grossly intact. moves all 4 extremities w/o difficulty. Affect pleasant  Telemetry   NSR 80s with PVCs, personally reviewed.   EKG   No new tracings.    Labs    CBC Recent Labs    06/28/18 0640 06/29/18 0643  WBC 6.3 5.1  HGB 12.1 11.3*  HCT 38.5 35.8*  MCV 84.1 83.8  PLT 244 063   Basic Metabolic Panel Recent Labs    06/28/18 0640 06/29/18 0407  NA 139 135  K 3.8 3.7  CL 107 106  CO2 27 21*  GLUCOSE 112* 104*  BUN 7 7  CREATININE 0.85 0.81  CALCIUM 8.9 8.4*   Liver Function Tests No results for input(s): AST, ALT, ALKPHOS, BILITOT, PROT, ALBUMIN in the last 72 hours. No results for input(s): LIPASE, AMYLASE in the last 72 hours. Cardiac Enzymes No results for input(s): CKTOTAL, CKMB, CKMBINDEX, TROPONINI in the last 72 hours.  BNP: BNP (last 3 results) Recent Labs    12/25/17 0017 06/22/18 1526  BNP 113.0* 615.0*   ProBNP (last 3  results) No results for input(s): PROBNP in the last 8760 hours.  D-Dimer No results for input(s): DDIMER in the last 72 hours. Hemoglobin A1C No results for input(s): HGBA1C in the last 72 hours. Fasting Lipid Panel Recent Labs    06/28/18 0640  CHOL 92  HDL 23*  LDLCALC 49  TRIG 102  CHOLHDL 4.0   Thyroid Function Tests No results for input(s): TSH, T4TOTAL, T3FREE, THYROIDAB in the last 72 hours.  Invalid input(s): FREET3  Other results:  Imaging   Dg Orthopantogram  Result Date: 06/28/2018 CLINICAL DATA:  Pre LVAD EXAM: ORTHOPANTOGRAM/PANORAMIC COMPARISON:  Sinus radiographs 06/02/2018 FINDINGS: Bones demineralized. Patient edentulous. No fracture or bone destruction. TMJ alignment normal bilaterally. IMPRESSION: Osseous demineralization without acute  abnormalities. Electronically Signed   By: Lavonia Dana M.D.   On: 06/28/2018 13:21   Ct Abdomen Pelvis W Contrast  Result Date: 06/28/2018 CLINICAL DATA:  Evaluate for ventricular assist device candidacy. EXAM: CT ABDOMEN AND PELVIS WITH CONTRAST TECHNIQUE: Multidetector CT imaging of the abdomen and pelvis was performed using the standard protocol following bolus administration of intravenous contrast. CONTRAST:  187mL OMNIPAQUE IOHEXOL 300 MG/ML  SOLN COMPARISON:  Multiple CT scans since January 30, 2012 FINDINGS: Lower chest: Cardiomegaly. Lung bases and lower chest otherwise normal. Hepatobiliary: Hepatic steatosis. There is a nodule of hyperenhancement in the left hepatic lobe measuring up to 8 mm on series 3, image 14, not significantly changed since June of 2013, of doubtful significance. There is a rounded region of hyperenhancement in the liver on series 3, image 12, measuring 10 mm. This is more prominent compared to previous studies. The liver is otherwise normal. Previous cholecystectomy. The portal vein is patent. Pancreas: Unremarkable. No pancreatic ductal dilatation or surrounding inflammatory changes. Spleen: Normal in size without focal abnormality. Adrenals/Urinary Tract: Adrenal glands are normal. There is a renal cyst posteriorly in the right kidney. Kidneys otherwise normal. No hydronephrosis or perinephric stranding. The ureters are unremarkable. The bladder is normal. Stomach/Bowel: The stomach and small bowel are normal. Colonic diverticulosis is seen without diverticulitis. The colon is normal. The appendix is normal. Vascular/Lymphatic: Atherosclerosis is seen in the nonaneurysmal aorta. No adenopathy. Reproductive: Status post hysterectomy. No adnexal masses. Other: Mild haziness in the fat of the pelvis may be from previous hysterectomy. No acutely in faint flame structure is noted. Musculoskeletal: No acute or significant osseous findings. IMPRESSION: 1. There is a 10 mm  hyperenhancing mass in the liver on series 3, image 12. This is more conspicuous compared to previous studies. 8 mm of hyperenhancement in the left hepatic lobe is stable compared to previous studies. Recommend an MRI of the liver to evaluate the 10 mm hyperenhancing lesion. 2. Colonic diverticulosis without diverticulitis. 3. Atherosclerosis in the nonaneurysmal aorta. Electronically Signed   By: Dorise Bullion III M.D   On: 06/28/2018 23:13    Medications:    Scheduled Medications: . ALPRAZolam  1 mg Oral QHS  . aspirin EC  81 mg Oral Daily  . atorvastatin  40 mg Oral Daily  . carvedilol  3.125 mg Oral BID WC  . cholecalciferol  1,000 Units Oral Daily  . digoxin  0.125 mg Oral QODAY  . enoxaparin (LOVENOX) injection  40 mg Subcutaneous Q24H  . feeding supplement  1 Container Oral BID BM  . fenofibrate  54 mg Oral Daily  . furosemide  20 mg Oral Daily  . losartan  12.5 mg Oral QHS  .  morphine injection  2 mg Intravenous Once  .  multivitamin with minerals  1 tablet Oral Daily  . potassium chloride  20 mEq Oral Daily  . sodium chloride flush  3 mL Intravenous Q12H  . sodium chloride flush  3 mL Intravenous Q12H    Infusions: . sodium chloride    . sodium chloride    . sodium chloride 10 mL/hr at 06/29/18 0626    PRN Medications: sodium chloride, sodium chloride, acetaminophen, albuterol, benzonatate, guaiFENesin-dextromethorphan, ondansetron (ZOFRAN) IV, sodium chloride flush, sodium chloride flush  Patient Profile   Martha Ellerby is a 52 y.o. female with CAD s/p PCI to RCA 2011, mixed ICM/NICM NYHA II, ACC B (EF 20%, s/p ICD) c/b NSVT, SLE, GERD, and ongoing tobacco use who is admitted with hypotension and cough.   Assessment/Plan   1. NSTEMI in CAD - s/p remote stent. Last cath 2004. Peak trop 30.3 - Cath 06/24/18 with in-stent stenosis of mRCA stent and significant LM disease.  - Discussed with TCTS and interventional team. Not candidate for CABG or PCI. Have  decided to proceed with VAD - No further CP. Off heparin. Working up for LVAD as below.  - Continue ASA and statin  2. Acute on chronic systolic CHF - Echo 08/26/30 LVEF 15-20%, Grade 2 DD, Mod MR, Severe LAE, Mod RV reduction - Volume status stable.  - Continue Coreg 3.125 mg BID.  - Continue losartan 12.5 mg qhs with hypotension.  - Continue digoxin 0.125 mg daily. Level ok 06/22/18  - Patient now interested in VAD. W/u underway. Patient concerned about timing, has a lot of classwork to do.   - Approved for continued LVAD work up at [facility] 06/28/18. She is aware that if she is going to get an LVAD, she will need to think about taking the rest of the semester off.  3. NSVT/VT - Managed by Dr. Caryl Comes. S/p MDT ICD.  - Intolerant to amiodarone and mexiletine - s/p ICD shock 12/2017 in setting of electrolytes. Can drive again this month.  - Continues to have frequent NSVT/PVCs, but no VT.  - ZIO patch 8-04/2018 with predominantly sinus rhythm with > 7900 runs of NSVT/VT, longest 30 seconds. - Frequent PVCs noted on telemetry, has not been getting Coreg with low BP.   - Continue coreg 3.125 mg BID with hypotension.   4. Tobacco abuse - Encouraged complete cessation. No change.  - PFTs 06/29/18 FVC 1.73 (56%), FEV1 1.49 (61%)  5. Severe protein calorie malnutrition - Prealbumin 8.9. Continue Ensure TID. No change.   6. Liver mass - concern for possible HCC - will need GI to see.  - unable to get MRI due to ICD - HCV Ab 0.1. Will check HCV RNA - Check AFP  RHC today. Potentially home after with plans for LVAD soon, pending continued work up.   Length of Stay: 4  Annamaria Helling  06/29/2018, 8:47 AM  Advanced Heart Failure Team Pager [phone] (M-F; 7a - 4p)  Please contact [facility] for night-coverage after hours (4p -7a ) and weekends on [email or web address]  Patient seen and examined with the above-signed Advanced Practice Provider and/or Housestaff. I personally reviewed  laboratory data, imaging studies and relevant notes. I independently examined the patient and formulated the important aspects of the plan. I have edited the note to reflect any of my changes or salient points. I have personally discussed the plan with the patient and/or family.  Stable from HF standpoint. Volume status ok. Plan RHC today as part of transplant w/u. Ab CT  reviewed personally and ?[place]. W/u as above, Will ask GI to see.   Glori Bickers, MD  1:00 PM

## 2018-06-29 NOTE — Progress Notes (Signed)
LVAD Initial Psychosocial Screening  Date/Time Initiated:  06-29-2018 11:35am Referral Source:  LVAD Coordinator Referral Reason:  LVAD implantation Source of Information:  Pt., Daughters and chart review  Demographics Name:  [name] Address:  [address] [place] [address] Home phone: [phone]    Cell: [phone] Marital Status:  Single  Faith:  Non demonimational Primary Language:  English SS:  [id number]   DOB:  July 18, 1966  Medical & Follow-up Adherence to Medical regimen/INR checks: compliant  Medication adherence: compliant  Physician/Clinic Appointment Attendance: compliant  Advance Directives: Do you have a Living Will or Medical POA? No  Would you like to complete a Living Will and Medical POA prior to surgery?  Yes Do you have Goals of Care? Yes  Have you had a consult with the Palliative Care Team at [facility]? No  Psychological Health Appearance:  In hospital gown Mental Status:  Alert, oriented Eye Contact:  Good Thought Content:  Coherent Speech:  Unremarkable Mood:  Appropriate  Affect:  Appropriate to circumstance Insight:  Good Judgement: Unimpaired Interaction Style:  Engaged  Family/Social Information Who lives in your home? Name:   Relationship:   [name]  Sister  Other family members/support persons in your life? Name:   Relationship:   [name]   Daughter  Caregiving Needs Who is the primary caregiver? [place] status:  good Do you drive?  yes Do you work? no  Physical Limitations:  none Do you have other care giving responsibilities?  Children Contact [phone]  Who is the secondary caregiver? [place] status:  good Do you drive?  yes Do you work? yes  Physical Limitations:  none Do you have other care giving responsibilities?  Children Contact number: [phone]  Home Environment/Personal Care Do you have reliable phone service? Yes  If so, what is the number?   [phone] [employer] Do you own or rent your home? own Current mortgage/rent: mortgage Number of steps into the home? none How many levels in the home? 3 levels Assistive devices in the home? none Electrical needs for LVAD (3 prong outlets)? yes Second hand smoke exposure in the home? none Travel distance from [facility]? 15 minutes Self-care: independent Ambulation: independent  Community Are you active with community agencies/resources/homecare? No Agency Name: n/a Are you active in a church, synagogue, mosque or other faith based community? No Faith based institutions name: n/a What other sources do you have for spiritual support? none Are you active in any clubs or social organizations? none What do you do for fun?  Hobbies?  Interests? Dance  Education/Work Information What is the last grade of school you completed? Pending 2nd Masters Preferred method of learning?  Written Do you have any problems with reading or writing?  No Are you currently employed?  No  When were you last employed? 1999  Name of employer? 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 No Are you interested in job training or learning new skills?  No Did you serve in the [employer]? No  If so, what branch? Other  Financial Information What is your source of income? Social Security Do you have difficulty meeting your monthly expenses? Yes at times If yes, which ones? varies How do you cope with this? "[employer] [place] to pay Eddie Dibbles"  Can you  budget for the monthly cost for dressing supplies post procedure? Yes  Primary Health insurance:  [place]: n/a Prescription plan: UHC What are your prescription co-pays? varies Do you use mail order for your prescriptions?  No Have you ever had to refuse medication due to cost?   No Have you applied for Medicaid?  no Have you applied for Social Security Disability (SSI) current   Medical Information Briefly describe why you are here for evaluation:Started with Lupus and medications caused heart damage. Lupus has been stable for years Do you have a PCP or other medical provider? Fayrene Helper, MD Are you able to complete your ADL's? yes Do you have a history of trauma, physical, emotional, or sexual abuse? no Do you have any family history of heart problems? Mother and Brother Do you smoke now or past usage? past usage    Quit date: May 30, 2018 Do you drink alcohol now or past usage? Yes  socially Are you currently using illegal drugs or misuse of medication or past usage? never  Have you ever been treated for substance abuse? No      If yes, where and when did you receive treatment?  Mental Health History How have you been feeling in the past year? fine Have you ever had any problems with depression, anxiety or other mental health issues? never Do you see a counselor, psychiatrist or therapist?  no If you are currently experiencing problems are you interested in talking with a professional? No Have you or are you taking medications for anxiety/depression or any mental health concerns?  No but states she was prescribed Xanax to help her sleep at night  Current Medications: Xanax What are your coping strategies under stressful situations?no stress Are there any other stressors in your life? denies Have you had any past or current thoughts of suicide? no How many hours do you sleep at night? Can't sleep How is your appetite? good Would you be interested in attending the LVAD support group? yes  PHQ2 Depression Scale: 0 PHQ9 Depression scale (if positive PHQ2 screen):     Legal Do you currently have any legal issues/problems?  no Have you had any legal issues/problems in the past? no    Plan for VAD Implementation Do you know and understand what  happens during the VAD surgery? Patient Verbalizes Understanding  of surgery and able to describe details What do you know about the risks and side effect associated with VAD surgery? Patient Verbalizes Understanding  of risks (infection, stroke and death) Explain what will happen right after surgery: Patient Verbalizes Understanding  of OR to ICU and will be intubated What is your plan for transportation for the first 8 weeks post-surgery? (Patients are not recommended to drive post-surgery for 8 weeks)  Driver:    Caryl Pina Do you have airbags in your vehicle?  There is a risk of discharging the device if the airbag were to deploy. What do you know about your diet post-surgery? Patient Verbalizes Understanding  of Heart healthy How do you plan to monitor your medications, current and future?   Self administers from the bottles How do you plan to complete ADL's post-surgery?  Ask for help Will it be difficult to ask for help from your caregivers? No daughters will assist    Please explain what you hope will be improved about your life as a result of receiving the LVAD? "My attitude" Please tell me your biggest concern or fear about living  with the LVAD?  Living with the batteries How do you cope with your concerns and fears?  Not sure as I was a smoker until recently Please explain your understanding of how their body will change?  Patient referenced the scar from surgery. Are you worried about these changes? Yes specially the scar on my chest Do you see any barriers to your surgery or follow-up? no  Understanding of LVAD Patient states understanding of the following: Surgical procedures and risks, Electrical need for LVAD (3 prong outlets), Safety precautions with LVAD (water, etc.), LVAD daily self-care (dressing changes, computer check, extra supplies), Outpatient follow up (LVAD clinic appts, monitoring blood thinners) and Need for Emergency Planning  Discussed and Reviewed with Patient and  Caregiver  Patient's current level of motivation to prepare for LVAD: motivated for improved health Patient's present Level of Consent for LVAD:  Ready    Education provided to patient/family/caregiver:   Caregiver role and responsibiltiy, Financial planning for LVAD, Role of Clinical Social Worker and Signs of Depression and Anxiety    Caregiver questions Please explain what you hope will be improved about your life and loved one's life as a result of receiving the LVAD?  Improved quality of life What is your biggest concern or fear about caregiving with an LVAD patient?  Not catching something in time. What is your plan for availability to provide care 24/7 x2 weeks post op and dressing changes ongoing?  Daughters will take on responsibility Who is the relief/backup caregiver and what is their availability?  Daughter will share caregiving Preferred method of learning? Written, Verbal and Hands on  Do you drive? yes How do you handle stressful situations?  Pray and laugh Do you think you can do this? yes Is there anything that concerns about caregiving?  no Do you provide caregiving to anyone else? Children  Caregiver's current level of motivation to prepare for LVAD: Motivated Caregiver's present level of consent for LVAD: Ready  Clinical Interventions Needed:    CSW will monitor signs and symptoms of depression and assist with adjustment to life with an LVAD. CSW will refer patient for Advanced Directives if not completed prior to surgery if still wishing to complete. CSW encouraged attendance with the LVAD Support Group to assist further with adjustment and post implant peer support.  Clinical Impressions/Recommendations:   Ms. Wenke is a 52yo female with 3 grown children. Her two daughters Caryl Pina and Elmo Putt will be the primary caregivers and appear very involved and supportive. Ms Machamer states compliance with her medical regimen and she denies any concerns with follow up  care. She does not have an AD but interested in completing stating her daughters will be HPOA if needed. She owns her own home and denies any concerns with electric outlets. She is not affiliated with any spiritual community and states she enjoys Dancing for fun. She is working on her 2nd masters degree although has been on disability for some time now. She has [employer] and states her co pays vary. She reports that she has Lupus and has heart damage from medications. She has a family history of heart disease as well. She quit smoking tobacco on 05/30/2018 and states she uses alcohol socially but has not drank recently due to illness. She denies any illegal drug use. She denies any mental health issues stating "I have nothing to be depressed about". She scored a 0 on the PHQ-2.  She hopes to "improve my attitude" as a result of the LVAD  implant. Patient appears motivated for improved health and recovery. She appears to have good support from her daughters. Patient appears to be a good candidate for LVAD implantation. Raquel Sarna, LCSW, [place]   Louann Liv, LCSW

## 2018-06-29 NOTE — Interval H&P Note (Signed)
History and Physical Interval Note:  06/29/2018 4:18 PM  [name]  has presented today for surgery, with the diagnosis of hf  The various methods of treatment have been discussed with the patient and family. After consideration of risks, benefits and other options for treatment, the patient has consented to  Procedure(s): RIGHT HEART CATH (N/A) as a surgical intervention .  The patient's history has been reviewed, patient examined, no change in status, stable for surgery.  I have reviewed the patient's chart and labs.  Questions were answered to the patient's satisfaction.     [name]

## 2018-06-29 NOTE — Progress Notes (Signed)
Order received for PFT for LVAD work up.  Patient had to be done at the bedside for simple pft because of Left main disease, and RN preferred patient stay on the floor due to history of chest pain.  Patient gave marginal effort for the test.

## 2018-06-29 NOTE — Progress Notes (Addendum)
Patient ID: [name], female   DOB: 1966-04-30, 52 y.o.   MRN: [id number]     Advanced Heart Failure Rounding Note  PCP-Cardiologist: No primary care provider on file.   Subjective:    Discussed at [facility] 06/28/18. Approved to move forward with LVAD work up.   Feeling OK this am. Anxious to go home. Denies lightheadedness or dizziness. Auto cuff not picking up BP well. Daughter who is present thinks it is a bad idea for patient to go home prior to surgery.    Cr 0.81. PFTs performed at bedside this am due to left main disease. Does not include DLCO.   CT today with possible liver mass.   PFTs 06/29/18 FVC 1.73 (56%) FEV1 1.49 (61%)  LHC 06/24/18  Dist LM lesion is 65% stenosed.  Prox RCA to Mid RCA lesion is 100% stenosed.  Ost Ramus lesion is 60% stenosed.  Non-stenotic Ramus lesion.  Prox LAD lesion is 70% stenosed.   Findings:  Ao = 79/58 (67) LV =  82/12  1. LM: Distal 60-70% lesion 2. LAD: 70% mid 3. Ramus: 60% prox 4. RCA 100% mid in stent with L -> R colalterals  EF 15% with severe global HK  Objective:   Weight Range: 93 kg Body mass index is 33.09 kg/m.   Vital Signs:   Temp:  [97.9 F (36.6 C)-98.3 F (36.8 C)] 98 F (36.7 C) (11/12 0822) Pulse Rate:  [79-92] 82 (11/12 0822) Resp:  [20-25] 25 (11/12 0822) BP: (85-108)/(56-90) 85/65 (11/12 0822) SpO2:  [92 %-99 %] 94 % (11/12 0822) Weight:  [93 kg] 93 kg (11/12 0417) Last BM Date: 06/27/18  Weight change: Filed Weights   06/27/18 0400 06/28/18 0500 06/29/18 0417  Weight: 93.1 kg 92.9 kg 93 kg    Intake/Output:   Intake/Output Summary (Last 24 hours) at 06/29/2018 0847 Last data filed at 06/29/2018 0626 Gross per 24 hour  Intake 1083 ml  Output 1252 ml  Net -169 ml     Physical Exam    General: No resp difficulty. HEENT: Normal anicteric  Neck: Supple. JVP 6-7 cm. Carotids 2+ bilat; no bruits. No thyromegaly or nodule noted. Cor: PMI laterally displaced. Irregular  due to frequent ectopy, No M/G/R noted Lungs: CTAB, normal effort. No wheeze  Abdomen: Soft, non-tender, non-distended, no HSM. No bruits or masses. +BS  Extremities: no cyanosis, clubbing, rash, edema Neuro: alert & oriented x 3, cranial nerves grossly intact. moves all 4 extremities w/o difficulty. Affect pleasant  Telemetry   NSR 80s with PVCs, personally reviewed.   EKG   No new tracings.    Labs    CBC Recent Labs    06/28/18 0640 06/29/18 0643  WBC 6.3 5.1  HGB 12.1 11.3*  HCT 38.5 35.8*  MCV 84.1 83.8  PLT 244 767   Basic Metabolic Panel Recent Labs    06/28/18 0640 06/29/18 0407  NA 139 135  K 3.8 3.7  CL 107 106  CO2 27 21*  GLUCOSE 112* 104*  BUN 7 7  CREATININE 0.85 0.81  CALCIUM 8.9 8.4*   Liver Function Tests No results for input(s): AST, ALT, ALKPHOS, BILITOT, PROT, ALBUMIN in the last 72 hours. No results for input(s): LIPASE, AMYLASE in the last 72 hours. Cardiac Enzymes No results for input(s): CKTOTAL, CKMB, CKMBINDEX, TROPONINI in the last 72 hours.  BNP: BNP (last 3 results) Recent Labs    12/25/17 0017 06/22/18 1526  BNP 113.0* 615.0*   ProBNP (last 3  results) No results for input(s): PROBNP in the last 8760 hours.  D-Dimer No results for input(s): DDIMER in the last 72 hours. Hemoglobin A1C No results for input(s): HGBA1C in the last 72 hours. Fasting Lipid Panel Recent Labs    06/28/18 0640  CHOL 92  HDL 23*  LDLCALC 49  TRIG 102  CHOLHDL 4.0   Thyroid Function Tests No results for input(s): TSH, T4TOTAL, T3FREE, THYROIDAB in the last 72 hours.  Invalid input(s): FREET3  Other results:  Imaging   Dg Orthopantogram  Result Date: 06/28/2018 CLINICAL DATA:  Pre LVAD EXAM: ORTHOPANTOGRAM/PANORAMIC COMPARISON:  Sinus radiographs 06/02/2018 FINDINGS: Bones demineralized. Patient edentulous. No fracture or bone destruction. TMJ alignment normal bilaterally. IMPRESSION: Osseous demineralization without acute  abnormalities. Electronically Signed   By: Lavonia Dana M.D.   On: 06/28/2018 13:21   Ct Abdomen Pelvis W Contrast  Result Date: 06/28/2018 CLINICAL DATA:  Evaluate for ventricular assist device candidacy. EXAM: CT ABDOMEN AND PELVIS WITH CONTRAST TECHNIQUE: Multidetector CT imaging of the abdomen and pelvis was performed using the standard protocol following bolus administration of intravenous contrast. CONTRAST:  119mL OMNIPAQUE IOHEXOL 300 MG/ML  SOLN COMPARISON:  Multiple CT scans since January 30, 2012 FINDINGS: Lower chest: Cardiomegaly. Lung bases and lower chest otherwise normal. Hepatobiliary: Hepatic steatosis. There is a nodule of hyperenhancement in the left hepatic lobe measuring up to 8 mm on series 3, image 14, not significantly changed since June of 2013, of doubtful significance. There is a rounded region of hyperenhancement in the liver on series 3, image 12, measuring 10 mm. This is more prominent compared to previous studies. The liver is otherwise normal. Previous cholecystectomy. The portal vein is patent. Pancreas: Unremarkable. No pancreatic ductal dilatation or surrounding inflammatory changes. Spleen: Normal in size without focal abnormality. Adrenals/Urinary Tract: Adrenal glands are normal. There is a renal cyst posteriorly in the right kidney. Kidneys otherwise normal. No hydronephrosis or perinephric stranding. The ureters are unremarkable. The bladder is normal. Stomach/Bowel: The stomach and small bowel are normal. Colonic diverticulosis is seen without diverticulitis. The colon is normal. The appendix is normal. Vascular/Lymphatic: Atherosclerosis is seen in the nonaneurysmal aorta. No adenopathy. Reproductive: Status post hysterectomy. No adnexal masses. Other: Mild haziness in the fat of the pelvis may be from previous hysterectomy. No acutely in faint flame structure is noted. Musculoskeletal: No acute or significant osseous findings. IMPRESSION: 1. There is a 10 mm  hyperenhancing mass in the liver on series 3, image 12. This is more conspicuous compared to previous studies. 8 mm of hyperenhancement in the left hepatic lobe is stable compared to previous studies. Recommend an MRI of the liver to evaluate the 10 mm hyperenhancing lesion. 2. Colonic diverticulosis without diverticulitis. 3. Atherosclerosis in the nonaneurysmal aorta. Electronically Signed   By: Dorise Bullion III M.D   On: 06/28/2018 23:13    Medications:    Scheduled Medications: . ALPRAZolam  1 mg Oral QHS  . aspirin EC  81 mg Oral Daily  . atorvastatin  40 mg Oral Daily  . carvedilol  3.125 mg Oral BID WC  . cholecalciferol  1,000 Units Oral Daily  . digoxin  0.125 mg Oral QODAY  . enoxaparin (LOVENOX) injection  40 mg Subcutaneous Q24H  . feeding supplement  1 Container Oral BID BM  . fenofibrate  54 mg Oral Daily  . furosemide  20 mg Oral Daily  . losartan  12.5 mg Oral QHS  .  morphine injection  2 mg Intravenous Once  .  multivitamin with minerals  1 tablet Oral Daily  . potassium chloride  20 mEq Oral Daily  . sodium chloride flush  3 mL Intravenous Q12H  . sodium chloride flush  3 mL Intravenous Q12H    Infusions: . sodium chloride    . sodium chloride    . sodium chloride 10 mL/hr at 06/29/18 0626    PRN Medications: sodium chloride, sodium chloride, acetaminophen, albuterol, benzonatate, guaiFENesin-dextromethorphan, ondansetron (ZOFRAN) IV, sodium chloride flush, sodium chloride flush  Patient Profile   Shalise Rosado is a 52 y.o. female with CAD s/p PCI to RCA 2011, mixed ICM/NICM NYHA II, ACC B (EF 20%, s/p ICD) c/b NSVT, SLE, GERD, and ongoing tobacco use who is admitted with hypotension and cough.   Assessment/Plan   1. NSTEMI in CAD - s/p remote stent. Last cath 2004. Peak trop 30.3 - Cath 06/24/18 with in-stent stenosis of mRCA stent and significant LM disease.  - Discussed with TCTS and interventional team. Not candidate for CABG or PCI. Have  decided to proceed with VAD - No further CP. Off heparin. Working up for LVAD as below.  - Continue ASA and statin  2. Acute on chronic systolic CHF - Echo 03/03/95 LVEF 15-20%, Grade 2 DD, Mod MR, Severe LAE, Mod RV reduction - Volume status stable.  - Continue Coreg 3.125 mg BID.  - Continue losartan 12.5 mg qhs with hypotension.  - Continue digoxin 0.125 mg daily. Level ok 06/22/18  - Patient now interested in VAD. W/u underway. Patient concerned about timing, has a lot of classwork to do.   - Approved for continued LVAD work up at [facility] 06/28/18. She is aware that if she is going to get an LVAD, she will need to think about taking the rest of the semester off.  3. NSVT/VT - Managed by Dr. Caryl Comes. S/p MDT ICD.  - Intolerant to amiodarone and mexiletine - s/p ICD shock 12/2017 in setting of electrolytes. Can drive again this month.  - Continues to have frequent NSVT/PVCs, but no VT.  - ZIO patch 8-04/2018 with predominantly sinus rhythm with > 7900 runs of NSVT/VT, longest 30 seconds. - Frequent PVCs noted on telemetry, has not been getting Coreg with low BP.   - Continue coreg 3.125 mg BID with hypotension.   4. Tobacco abuse - Encouraged complete cessation. No change.  - PFTs 06/29/18 FVC 1.73 (56%), FEV1 1.49 (61%)  5. Severe protein calorie malnutrition - Prealbumin 8.9. Continue Ensure TID. No change.   6. Liver mass - concern for possible HCC - will need GI to see.  - unable to get MRI due to ICD - HCV Ab 0.1. Will check HCV RNA - Check AFP  RHC today. Potentially home after with plans for LVAD soon, pending continued work up.   Length of Stay: 4  Annamaria Helling  06/29/2018, 8:47 AM  Advanced Heart Failure Team Pager [phone] (M-F; 7a - 4p)  Please contact [facility] for night-coverage after hours (4p -7a ) and weekends on [email or web address]  Patient seen and examined with the above-signed Advanced Practice Provider and/or Housestaff. I personally reviewed  laboratory data, imaging studies and relevant notes. I independently examined the patient and formulated the important aspects of the plan. I have edited the note to reflect any of my changes or salient points. I have personally discussed the plan with the patient and/or family.  Stable from HF standpoint. Volume status ok. Plan RHC today as part of transplant w/u. Ab CT  reviewed personally and ?[place]. W/u as above, Will ask GI to see.   Glori Bickers, MD  1:00 PM

## 2018-06-29 NOTE — Progress Notes (Signed)
Patient ID: [name], female   DOB: [birth year], 52 y.o.   MRN: [id number]    Radiology has been referred this patient in the consideration of liver lesions. The present CT abdomen is compared to prior CT imaging from 06/29/2017 and dating back to 2013. The right lobe liver lesion is stable compared with the scan dated 06/29/2012. See image 83 of series 5. The left lobe lesion has also been stable as mentioned in the dictated report. The right lobe lesion is less conspicuous on remote imaging likely due to phase of contrast time. These lesions are therefore statistically benign.

## 2018-06-29 NOTE — Plan of Care (Signed)

## 2018-06-30 DIAGNOSIS — I472 Ventricular tachycardia, unspecified: Secondary | ICD-10-CM

## 2018-06-30 DIAGNOSIS — I442 Atrioventricular block, complete: Secondary | ICD-10-CM

## 2018-06-30 LAB — CBC
HCT: 38.2 % (ref 36.0–46.0)
HEMOGLOBIN: 11.5 g/dL — AB (ref 12.0–15.0)
MCH: 25.4 pg — AB (ref 26.0–34.0)
MCHC: 30.1 g/dL (ref 30.0–36.0)
MCV: 84.3 fL (ref 80.0–100.0)
Platelets: 258 10*3/uL (ref 150–400)
RBC: 4.53 MIL/uL (ref 3.87–5.11)
RDW: 13.5 % (ref 11.5–15.5)
WBC: 4.9 10*3/uL (ref 4.0–10.5)
nRBC: 0 % (ref 0.0–0.2)

## 2018-06-30 LAB — BASIC METABOLIC PANEL
ANION GAP: 5 (ref 5–15)
BUN: 9 mg/dL (ref 6–20)
CHLORIDE: 105 mmol/L (ref 98–111)
CO2: 26 mmol/L (ref 22–32)
Calcium: 9.1 mg/dL (ref 8.9–10.3)
Creatinine, Ser: 0.8 mg/dL (ref 0.44–1.00)
GFR calc Af Amer: >60 mL/min (ref >60)
GFR calc non Af Amer: >60 mL/min (ref >60)
Glucose, Bld: 103 mg/dL — ABNORMAL HIGH (ref 70–99)
POTASSIUM: 3.8 mmol/L (ref 3.5–5.1)
Sodium: 136 mmol/L (ref 135–145)

## 2018-06-30 LAB — PROTIME-INR
INR: 1.2
Prothrombin Time: 15.1 seconds (ref 11.4–15.2)

## 2018-06-30 LAB — AFP TUMOR MARKER
AFP, SERUM, TUMOR MARKER: 5.5 ng/mL (ref 0.0–8.3)
AFP, Serum, Tumor Marker: 5.6 ng/mL (ref 0.0–8.3)

## 2018-06-30 LAB — MAGNESIUM: MAGNESIUM: 2 mg/dL (ref 1.7–2.4)

## 2018-06-30 MED ORDER — MEXILETINE HCL 200 MG PO CAPS
200.0000 mg | ORAL_CAPSULE | Freq: Three times a day (TID) | ORAL | Status: DC
  Administered 2018-06-30: 200 mg via ORAL
  Filled 2018-06-30 (×3): qty 1

## 2018-06-30 MED ORDER — LOSARTAN POTASSIUM 25 MG PO TABS: 12.5000 mg | ORAL_TABLET | Freq: Every day | ORAL | 3 refills | Status: DC

## 2018-06-30 MED ORDER — MEXILETINE HCL 200 MG PO CAPS: 200.0000 mg | ORAL_CAPSULE | Freq: Three times a day (TID) | ORAL | 3 refills | Status: DC

## 2018-06-30 MED ORDER — CARVEDILOL 3.125 MG PO TABS: 3.1250 mg | ORAL_TABLET | Freq: Two times a day (BID) | ORAL | 3 refills | Status: DC

## 2018-06-30 MED FILL — Heparin Sod (Porcine)-NaCl IV Soln 1000 Unit/500ML-0.9%: INTRAVENOUS | Qty: 500 | Status: AC

## 2018-06-30 NOTE — H&P (Addendum)
Advanced Heart Failure Team History and Physical Note   PCP:  Fayrene Helper, MD  PCP-Cardiology: No primary care provider on file.     Reason for Admission: Ventricular tachycardia   HPI:    [name] is a 52 y.o. female with CAD s/p PCI to RCA 2011, NSTEMI 06/2018 with 3v CAD, mixed ICM/NICM (EF 20%, s/p ICD), NSVT, SLE, GERD, and ongoing tobacco.   Admitted earlier this month with hypotensive in setting of URI and frequent PVCs, but quickly improved with med adjustment. Afebrile and no WBC, so ABX not pursued.   She was planned for discharge 06/23/18 but developed acute sharp chest pain. CT angio negative for dissection. Taken for Cath 06/24/18 with in-stent stenosis of mRCA stent and significant LM disease. Not candidate at this time for CABG or PCI, so work up for LVAD began.  Work up relatively unremarkable. CT chest/abd/pelvis with liver mass, but found to be chronic as far back as 6 years, and therefore likely benign.   With non-candidacy for CABG/PCI and decline over past several months, discussed at [facility] 06/28/18 and approved to move forward with LVAD work up. Not candidate for transplant with tobacco abuse.   Went for [facility] 06/29/18 which showed normal filling pressure and moderately reduced cardiac output as below. Noted to have continued very frequent PVCs. Dr. Caryl Comes asked to see by Dr. Haroldine Laws, who recommended re-trial of antiarrythmic medications. Started on mexilitene 06/30/18 with plans for home and re-admission 07/05/18 to re-trial on amiodarone.   If unable to tolerate meds, or suppress ectopy, will need to continue with VAD. Patient aware and agrees to current strategy.   Encouraged continued tobacco cessation.   Pt examined pm of 06/30/18 and determined stable for home with re-admission planned for 07/05/18.  She presents today for planned admission to rechallenge on amiodarone. Overall doing fine. Denies SOB, orthopnea, or edema.  Having a lot of  heartburn when she takes mexilitine. No improvement with protonix (started 2 days ago). No palpitations, dizziness, or CP. Weights have been stable over the last few days. Taking all medications.  [place] 06/29/18 RA = 5 RV = 34/5 PA = 40/11 (21) PCW = 9 Fick cardiac output/index = 4.3/2.1 PVR = 2.8 WU Ao sat = 93% PA sat = 48%, 56%  LHC 06/24/18  Dist LM lesion is 65% stenosed.  Prox RCA to Mid RCA lesion is 100% stenosed.  Ost Ramus lesion is 60% stenosed.  Non-stenotic Ramus lesion.  Prox LAD lesion is 70% stenosed.  Findings: Ao = 79/58 (67) LV = 82/12 1. LM: Distal 60-70% lesion 2. LAD: 70% mid 3. Ramus: 60% prox 4. RCA 100% mid in stent with L -> R colalterals EF 15% with severe global HK  Review of Systems: [y] = yes, [ ]  = no   General: Weight gain [ ] ; Weight loss [ ] ; Anorexia [ ] ; Fatigue [ ] ; Fever [ ] ; Chills [ ] ; Weakness [ ]   Cardiac: Chest pain/pressure [ ] ; Resting SOB [ ] ; Exertional SOB [ ] ; Orthopnea [ ] ; Pedal Edema [ ] ; Palpitations [ ] ; Syncope [ ] ; Presyncope [ ] ; Paroxysmal nocturnal dyspnea[ ]   Pulmonary: Cough [ ] ; Wheezing[ ] ; Hemoptysis[ ] ; Sputum [ ] ; Snoring [ ]   GI: Vomiting[ ] ; Dysphagia[ ] ; Melena[ ] ; Hematochezia [ ] ; Heartburn[y ]; Abdominal pain [ ] ; Constipation [ ] ; Diarrhea [ ] ; BRBPR [ ]   GU: Hematuria[ ] ; Dysuria [ ] ; Nocturia[ ]   Vascular: Pain in legs with walking [ ] ;  Pain in feet with lying flat [ ] ; Non-healing sores [ ] ; Stroke [ ] ; TIA [ ] ; Slurred speech [ ] ;  Neuro: Headaches[ ] ; Vertigo[ ] ; Seizures[ ] ; Paresthesias[ ] ;Blurred vision [ ] ; Diplopia [ ] ; Vision changes [ ]   Ortho/Skin: Arthritis [ ] ; Joint pain [ ] ; Muscle pain [ ] ; Joint swelling [ ] ; Back Pain [ ] ; Rash [ ]   Psych: Depression[ ] ; Anxiety[ ]   Heme: Bleeding problems [ ] ; Clotting disorders [ ] ; Anemia [ ]   Endocrine: Diabetes [ ] ; Thyroid dysfunction[ ]    Home Medications Prior to Admission medications   Medication Sig Start Date End Date Taking?  Authorizing Provider  albuterol (PROVENTIL HFA;VENTOLIN HFA) 108 (90 Base) MCG/ACT inhaler Inhale 2 puffs into the lungs every 6 (six) hours as needed for wheezing or shortness of breath. 03/13/16   Fayrene Helper, MD  ALPRAZolam Duanne Moron) 1 MG tablet Take 1 tablet (1 mg total) by mouth at bedtime. 03/09/18   Fayrene Helper, MD  aspirin 81 MG tablet Take 1 tablet (81 mg total) by mouth daily. 09/26/13   Deboraha Sprang, MD  atorvastatin (LIPITOR) 10 MG tablet Take 1 tablet (10 mg total) by mouth daily. 05/24/18   Deboraha Sprang, MD  carvedilol (COREG) 12.5 MG tablet Take 1 tablet (12.5 mg total) by mouth 2 (two) times daily. 12/21/17   Ina Scrivens, Shaune Pascal, MD  carvedilol (COREG) 3.125 MG tablet Take 1 tablet (3.125 mg total) by mouth 2 (two) times daily with a meal. 06/30/18   Tillery, Satira Mccallum, PA-C  cholecalciferol (VITAMIN D) 1000 units tablet Take 1,000 Units by mouth daily.    [provider]  clopidogrel (PLAVIX) 75 MG tablet Take 1 tablet (75 mg total) by mouth daily. 11/04/17   Fayrene Helper, MD  cyclobenzaprine (FLEXERIL) 5 MG tablet Take 1 tablet (5 mg total) by mouth at bedtime. 03/09/18   Fayrene Helper, MD  digoxin (LANOXIN) 0.125 MG tablet Take 1 tablet (0.125 mg total) by mouth every other day. 05/27/18   Deboraha Sprang, MD  fenofibrate (TRICOR) 145 MG tablet Take 1 tablet (145 mg total) by mouth daily. 05/24/18   Deboraha Sprang, MD  furosemide (LASIX) 20 MG tablet Take 20 mg by mouth daily.    [provider]  losartan (COZAAR) 25 MG tablet Take 0.5 tablets (12.5 mg total) by mouth at bedtime. 06/30/18   Shirley Friar, PA-C  mexiletine (MEXITIL) 200 MG capsule Take 1 capsule (200 mg total) by mouth 3 (three) times daily. 06/30/18   Shirley Friar, PA-C  oxyCODONE (ROXICODONE) 15 MG immediate release tablet Take 15 mg by mouth every 8 (eight) hours.     [provider]  potassium chloride (K-DUR,KLOR-CON) 20 MEQ tablet  Take 1 tablet (20 mEq total) by mouth daily. 06/24/18   Shirley Friar, PA-C  sacubitril-valsartan (ENTRESTO) 24-26 MG Take 1 tablet by mouth 2 (two) times daily. 05/24/18   Deboraha Sprang, MD    Past Medical History: Past Medical History:  Diagnosis Date  . Arthritis of knee   . Automatic implantable cardiac defibrillator in situ    a. s/p prior Medtronic ICD with 6949 lead. b. s/p Gen change & lead revision 05/2013.  . Cardiomyopathy secondary    Patient has cardiomyopathy out of proportion to her ischemic heart disease  . Chronic pain   . coronary artery disease    RCA stenting Myoview 2011 EF 30% infarction dilatation without ischemia  .  Depression   . Diabetes mellitus   . GERD (gastroesophageal reflux disease)   . Insomnia   . Lupus (systemic lupus erythematosus) ([place])   . Membranous glomerulonephritis   . Migraines   . Myocardial infarction ([place])    8 total   . Nicotine abuse   . Other and unspecified hyperlipidemia   . Paroxysmal VT ([place])   . Systolic CHF ([place])   . VF (ventricular fibrillation) ([place])    a. Hx appropriate ICD therapy for VF.    Past Surgical History: Past Surgical History:  Procedure Laterality Date  . ABDOMINAL HYSTERECTOMY    . CHOLECYSTECTOMY    . COLONOSCOPY  07/15/2011   SLF: internal hemorrhoids/hyperplastic polyps in the rectum/tubular adenomaSURVEILLANCE Nov 2017  . defibrillator placed   2009 and 05/2013  . ICD GENERATOR CHANGE  06/17/2013   Dr Caryl Comes  . IMPLANTABLE CARDIOVERTER DEFIBRILLATOR (ICD) GENERATOR CHANGE Left 06/17/2013   Procedure: ICD GENERATOR CHANGE;  Surgeon: Deboraha Sprang, MD;  Location: [facility] CATH LAB;  Service: Cardiovascular;  Laterality: Left;  . LEAD REVISION N/A 06/17/2013   Procedure: LEAD REVISION;  Surgeon: Deboraha Sprang, MD;  Location: [facility] CATH LAB;  Service: Cardiovascular;  Laterality: N/A;  . LEFT HEART CATH AND CORONARY ANGIOGRAPHY N/A 06/24/2018   Procedure: LEFT HEART CATH AND CORONARY ANGIOGRAPHY;   Surgeon: Jolaine Artist, MD;  Location: [place] CV LAB;  Service: Cardiovascular;  Laterality: N/A;  . RIGHT HEART CATH N/A 06/29/2018   Procedure: RIGHT HEART CATH;  Surgeon: Jolaine Artist, MD;  Location: [place] CV LAB;  Service: Cardiovascular;  Laterality: N/A;  . ROTATOR CUFF REPAIR Right 2002   Family History:  Family History  Problem Relation Age of Onset  . Hepatitis Mother 63       HCV  . Liver cancer Mother 49  . Cancer Mother   . Diabetes Mother   . Heart disease Mother   . Hyperlipidemia Mother   . Stroke Sister 45       x3  . Diabetes Sister   . Hyperlipidemia Sister   . Dementia Father   . HIV Sister   . Diabetes Brother   . Heart disease Brother   . Hyperlipidemia Brother     Social History: Social History   Socioeconomic History  . Marital status: Single    Spouse name: Not on file  . Number of children: 3  . Years of education: Not on file  . Highest education level: Not on file  Occupational History  . Occupation: disabled    Fish farm manager: UNEMPLOYED  Social Needs  . Financial resource strain: Not hard at all  . Food insecurity:    Worry: Sometimes true    Inability: Sometimes true  . Transportation needs:    Medical: No    Non-medical: No  Tobacco Use  . Smoking status: Current Some Day Smoker    Packs/day: 0.50    Years: 15.00    Pack years: 7.50    Types: Cigarettes  . Smokeless tobacco: Never Used  Substance and Sexual Activity  . Alcohol use: Yes    Alcohol/week: 0.0 standard drinks    Comment: occasionaly  . Drug use: No  . Sexual activity: Not Currently    Birth control/protection: Surgical  Lifestyle  . Physical activity:    Days per week: 0 days    Minutes per session: 0 min  . Stress: Not at all  Relationships  . Social connections:    Talks on  phone: More than three times a week    Gets together: Once a week    Attends religious service: Never    Active member of club or organization: No    Attends  meetings of clubs or organizations: Never    Relationship status: Never married  Other Topics Concern  . Not on file  Social History Narrative  . Not on file    Allergies:  Allergies  Allergen Reactions  . Amiodarone     Headache and facial swelling  . Bee Venom Anaphylaxis  . Mexiletine     Dr. Haroldine Laws and Caryl Comes aware and are giving new trial on 06/30/18  . Cortisone Swelling  . Robaxin [Methocarbamol] Hives and Itching    Objective:    Vital Signs:   Pulse Rate:  [80] 80 (11/18 1348) Resp:  [16] 16 (11/18 1348) BP: (92)/(70) 92/70 (11/18 1348) Weight:  [92.6 kg] 92.6 kg (11/18 1334)   Filed Weights   07/05/18 1334  Weight: 92.6 kg     Physical Exam     General:  No respiratory difficulty HEENT: Normal Neck: Supple. JVP 6-7. Carotids 2+ bilat; no bruits. No lymphadenopathy or thyromegaly appreciated. Cor: PMI nondisplaced. Regular rate & rhythm with frequent ectopy. No rubs, gallops or murmurs. Lungs: Clear Abdomen: Soft, nontender, nondistended. No hepatosplenomegaly. No bruits or masses. Good bowel sounds. Extremities: No cyanosis, clubbing, rash, edema. Warm.  Neuro: Alert & oriented x 3, cranial nerves grossly intact. moves all 4 extremities w/o difficulty. Affect pleasant.   Telemetry   NSR with frequent PVCs. Personally reviewed.   EKG   Pending  Labs     Basic Metabolic Panel: Recent Labs  Lab 06/29/18 0407 06/30/18 0024  NA 135 136  K 3.7 3.8  CL 106 105  CO2 21* 26  GLUCOSE 104* 103*  BUN 7 9  CREATININE 0.81 0.80  CALCIUM 8.4* 9.1  MG  --  2.0    Liver Function Tests: No results for input(s): AST, ALT, ALKPHOS, BILITOT, PROT, ALBUMIN in the last 168 hours. No results for input(s): LIPASE, AMYLASE in the last 168 hours. No results for input(s): AMMONIA in the last 168 hours.  CBC: Recent Labs  Lab 06/29/18 0643 06/30/18 0024  WBC 5.1 4.9  HGB 11.3* 11.5*  HCT 35.8* 38.2  MCV 83.8 84.3  PLT 232 258    Cardiac  Enzymes: No results for input(s): CKTOTAL, CKMB, CKMBINDEX, TROPONINI in the last 168 hours.  BNP: BNP (last 3 results) Recent Labs    12/25/17 0017 06/22/18 1526  BNP 113.0* 615.0*    ProBNP (last 3 results) No results for input(s): PROBNP in the last 8760 hours.   CBG: No results for input(s): GLUCAP in the last 168 hours.  Coagulation Studies: No results for input(s): LABPROT, INR in the last 72 hours.  Imaging: No results found.   Patient Profile   [name] is a 52 y.o. female with CAD s/p PCI to RCA 2011, mixed ICM/NICM NYHA II, ACC B (EF 20%, s/p ICD) c/b NSVT, SLE, GERD, and ongoing tobacco use.   Presented for planned admission due to NSVT/VT to re-challenge on antiarrythmic therapy with previous reactions to same.   Assessment/Plan   1. NSVT/VT - Managed by Dr. Caryl Comes. S/p MDT ICD.  - Started back on mexiletine 200 mg TID 06/30/18. Having heartburn since starting. See below.  - Rechallenge amiodarone with IV bolus. Follow closely.  - s/p ICD shock 12/2017 in setting of electrolytes. Can drive  again this month.  - Continues to have frequent NSVT/PVCs, but no VT. Suspect contributing to cardiomyopathy.  - Dr. Haroldine Laws to discuss more aggressive antiarrythmic therapy with Dr. Caryl Comes.  - ZIO patch 8-04/2018 with predominantly sinus rhythm with > 7900 runs of NSVT/VT, longest 30 seconds. - Continue coreg 3.125 mg BID as tolerated.   2. NSTEMI in CAD - s/p remote stent. Last cath 2004. Peak trop 30.3 - Cath 06/24/18 with in-stent stenosis of mRCA stent and significant LM disease.  - Discussed with TCTS and interventional team. Not candidate for CABG or PCI at this time.  - If EF improves with PVC suppression, may be able to re-consider CABG, but if not, will need to continue VAD consideration.  - No further CP.  - Continue ASA and statin.  3. Chronic systolic CHF - Echo 1/44/81 LVEF 15-20%, Grade 2 DD, Mod MR, Severe LAE, Mod RV reduction - Volume  status stable on exam. Continue lasix 20 mg daily.  - Continue Coreg 3.125 mg BID.  - Continue losartan 12.5 mg qhs with hypotension.  - Continue digoxin 0.125 mg daily. Level ok 06/22/18  - Consider for VAD, unless we can achieve PVC suppression and EF recovery with antiarrhythmic therapy. If not, will need to proceed with LVAD. If recovers, may be candidate for CABG.  - Liver lesion likely benign. AFP negative.  4. Tobacco abuse - Encouraged complete cessation.  - PFTs 06/29/18 FVC 1.73 (56%), FEV1 1.49 (61%)  5. GERD - Continue protonix - Add pepcid   Georgiana Shore, NP 07/05/2018, 1:48 PM  Advanced Heart Failure Team Pager [phone] (M-F; 7a - 4p)  Please contact [facility] for night-coverage after hours (4p -7a ) and weekends on [email or web address]  Patient seen and examined with the above-signed Advanced Practice Provider and/or Housestaff. I personally reviewed laboratory data, imaging studies and relevant notes. I independently examined the patient and formulated the important aspects of the plan. I have edited the note to reflect any of my changes or salient points. I have personally discussed the plan with the patient and/or family.  52 y/o woman with severe mixed ischemic/nonischemic CM with EF ~15%. Was admitted last week with NSTEM found to have severe 3vCAD including 70% LM however EF felt to be reduced out of proportion to CAD. She has had nearly incessant ventricular ectopy and it was thought that EF might improve with treatment of ectopy. Started on mexilitene but not started on amio due to possible previous angioedema. Now admitted for inpatient amiodarone loading. If EF recovers can consider CABG. If no significant recovery will need VAD - w/u underway.   Glori Bickers, MD  5:11 PM

## 2018-06-30 NOTE — Discharge Summary (Addendum)
Advanced Heart Failure Discharge Note  Discharge Summary   Patient ID: [name] MRN: [id number], DOB/AGE: [birth year] 54 y.o. Admit date: 06/22/2018 D/C date:     06/30/2018   Primary Discharge Diagnoses:  1. NSTEMI/ 3v CAD 2. Acute on chronic systolic CHFdue to mixed ischemic/non-ischemic CM 3. Frequent NSVT/VT 4. Tobacco abuse 5. Severe protein calorie malnutrition 6. Liver mass  Hospital Course:   [name] is a 52 y.o. female with CAD s/p PCI to RCA 2011, mixed ICM/NICM NYHA II, (EF 20%, s/p ICD) c/b NSVT, SLE, GERD, and ongoing tobacco use who is admitted with hypotension and cough.   Hypotensive in setting of URI and frequent PVCs, but quickly improved with med adjustment. Afebrile and no WBC, so ABX not pursued.    She was planned for discharge 06/23/18 but developed acute sharp chest pain. CT angio negative for dissection. Taken for Cath 06/24/18 with in-stent stenosis of mRCA stent and significant LM disease. Not candidate at this time for CABG or PCI, so work up for LVAD began.  Work up relatively unremarkable. CT chest/abd/pelvis with liver mass, but found to be chronic as far back as 6 years, and therefore likely benign.   With non-candidacy for CABG/PCI and decline over past several months, discussed at [facility] 06/28/18 and approved to move forward with LVAD work up. Not candidate for transplant with tobacco abuse.   Went for [facility] 06/29/18 which showed normal filling pressure and moderately reduced cardiac output as below. Noted to have continued very frequent PVCs. Dr. Caryl Comes asked to see by Dr. Haroldine Laws, who recommended re-trial of antiarrythmic medications. Started on mexilitene 06/30/18 with plans for home and re-admission 07/05/18 to re-trial on amiodarone.   If unable to tolerate meds, or suppress ectopy, will need to continue with VAD. Patient aware and agrees to current strategy.   Encouraged continued tobacco cessation.   Pt examined pm of  06/30/18 and determined stable for home with re-admission planned for 07/05/18.  Discharge Weight: 204 lbs Discharge Vitals: Blood pressure 96/65, pulse 74, temperature 98.9 F (37.2 C), temperature source Oral, resp. rate 18, height 5\' 6"  (1.676 m), weight 92.5 kg, SpO2 95 %.  Labs: Lab Results  Component Value Date   WBC 4.9 06/30/2018   HGB 11.5 (L) 06/30/2018   HCT 38.2 06/30/2018   MCV 84.3 06/30/2018   PLT 258 06/30/2018    Recent Labs  Lab 06/26/18 0357  06/30/18 0024  NA 140   < > 136  K 3.9   < > 3.8  CL 109   < > 105  CO2 28   < > 26  BUN <5*   < > 9  CREATININE 0.83   < > 0.80  CALCIUM 8.6*   < > 9.1  PROT 6.7  --   --   BILITOT 0.8  --   --   ALKPHOS 54  --   --   ALT 22  --   --   AST 68*  --   --   GLUCOSE 107*   < > 103*   < > = values in this interval not displayed.   Lab Results  Component Value Date   CHOL 92 06/28/2018   HDL 23 (L) 06/28/2018   LDLCALC 49 06/28/2018   TRIG 102 06/28/2018   BNP (last 3 results) Recent Labs    12/25/17 0017 06/22/18 1526  BNP 113.0* 615.0*    ProBNP (last 3 results) No results for input(s): PROBNP in  the last 8760 hours.   Diagnostic Studies/Procedures   RHC 06/29/18 RA = 5 RV = 34/5 PA = 40/11 (21) PCW = 9 Fick cardiac output/index = 4.3/2.1 PVR = 2.8 WU Ao sat = 93% PA sat = 48%, 56%  LHC 06/24/18  Dist LM lesion is 65% stenosed.  Prox RCA to Mid RCA lesion is 100% stenosed.  Ost Ramus lesion is 60% stenosed.  Non-stenotic Ramus lesion.  Prox LAD lesion is 70% stenosed.  Findings: Ao = 79/58 (67) LV = 82/12  1. LM: Distal 60-70% lesion 2. LAD: 70% mid 3. Ramus: 60% prox 4. RCA 100% mid in stent with L -> R colalterals  EF 15% with severe global HK  Ct Abdomen Pelvis W Contrast  Result Date: 06/28/2018 CLINICAL DATA:  Evaluate for ventricular assist device candidacy. EXAM: CT ABDOMEN AND PELVIS WITH CONTRAST TECHNIQUE: Multidetector CT imaging of the abdomen and pelvis  was performed using the standard protocol following bolus administration of intravenous contrast. CONTRAST:  16mL OMNIPAQUE IOHEXOL 300 MG/ML  SOLN COMPARISON:  Multiple CT scans since January 30, 2012 FINDINGS: Lower chest: Cardiomegaly. Lung bases and lower chest otherwise normal. Hepatobiliary: Hepatic steatosis. There is a nodule of hyperenhancement in the left hepatic lobe measuring up to 8 mm on series 3, image 14, not significantly changed since June of 2013, of doubtful significance. There is a rounded region of hyperenhancement in the liver on series 3, image 12, measuring 10 mm. This is more prominent compared to previous studies. The liver is otherwise normal. Previous cholecystectomy. The portal vein is patent. Pancreas: Unremarkable. No pancreatic ductal dilatation or surrounding inflammatory changes. Spleen: Normal in size without focal abnormality. Adrenals/Urinary Tract: Adrenal glands are normal. There is a renal cyst posteriorly in the right kidney. Kidneys otherwise normal. No hydronephrosis or perinephric stranding. The ureters are unremarkable. The bladder is normal. Stomach/Bowel: The stomach and small bowel are normal. Colonic diverticulosis is seen without diverticulitis. The colon is normal. The appendix is normal. Vascular/Lymphatic: Atherosclerosis is seen in the nonaneurysmal aorta. No adenopathy. Reproductive: Status post hysterectomy. No adnexal masses. Other: Mild haziness in the fat of the pelvis may be from previous hysterectomy. No acutely in faint flame structure is noted. Musculoskeletal: No acute or significant osseous findings. IMPRESSION: 1. There is a 10 mm hyperenhancing mass in the liver on series 3, image 12. This is more conspicuous compared to previous studies. 8 mm of hyperenhancement in the left hepatic lobe is stable compared to previous studies. Recommend an MRI of the liver to evaluate the 10 mm hyperenhancing lesion. 2. Colonic diverticulosis without diverticulitis.  3. Atherosclerosis in the nonaneurysmal aorta. Electronically Signed   By: Dorise Bullion III M.D   On: 06/28/2018 23:13   Vas US Doppler Pre Vad  Result Date: 06/29/2018 PERIOPERATIVE VASCULAR EVALUATION Indications: Pro op lvad. Performing Technologist: Birdena Crandall, [place] RVS  Examination Guidelines: A complete evaluation includes B-mode imaging, spectral Doppler, color Doppler, and power Doppler as needed of all accessible portions of each vessel. Bilateral testing is considered an integral part of a complete examination. Limited examinations for reoccurring indications may be performed as noted.  Right Carotid Findings: +----------+--------+--------+--------+--------+--------------------+           PSV cm/sEDV cm/sStenosisDescribeComments             +----------+--------+--------+--------+--------+--------------------+ CCA Prox  54      11                      mild  intimal changes +----------+--------+--------+--------+--------+--------------------+ CCA Distal50      12                      mild itnimal changes +----------+--------+--------+--------+--------+--------------------+ ICA Prox  27      9                       mild intimal changes +----------+--------+--------+--------+--------+--------------------+ ICA Mid   61      30                      mild intimal changes +----------+--------+--------+--------+--------+--------------------+ ICA Distal62      29                      tortuous             +----------+--------+--------+--------+--------+--------------------+ ECA       52      10                      mild intimal changes +----------+--------+--------+--------+--------+--------------------+ Portions of this table do not appear on this page. +----------+--------+-------+--------+------------+           PSV cm/sEDV cmsDescribeArm Pressure +----------+--------+-------+--------+------------+ Subclavian58                     96            +----------+--------+-------+--------+------------+ +---------+--------+--+--------+--+ VertebralPSV cm/s33EDV cm/s17 +---------+--------+--+--------+--+ Left Carotid Findings: +----------+--------+--------+--------+--------+--------------------+           PSV cm/sEDV cm/sStenosisDescribeComments             +----------+--------+--------+--------+--------+--------------------+ CCA Prox  80      20                      mild intimal changes +----------+--------+--------+--------+--------+--------------------+ CCA Distal53      17                      mild intimal changes +----------+--------+--------+--------+--------+--------------------+ ICA Prox  25      11                      mild intimal changes +----------+--------+--------+--------+--------+--------------------+ ICA Mid   45      19                      mild intimal changes +----------+--------+--------+--------+--------+--------------------+ ICA Distal71      33                      tortuous             +----------+--------+--------+--------+--------+--------------------+ ECA       60      15                      mild intimal changes +----------+--------+--------+--------+--------+--------------------+ +----------+--------+--------+--------+------------+ SubclavianPSV cm/sEDV cm/sDescribeArm Pressure +----------+--------+--------+--------+------------+           59                                   +----------+--------+--------+--------+------------+ +---------+--------+--+--------+--+ VertebralPSV cm/s39EDV cm/s13 +---------+--------+--+--------+--+  ABI Findings: +--------+------------------+-----+---------+--------+ Right   Rt Pressure (mmHg)IndexWaveform Comment  +--------+------------------+-----+---------+--------+ Brachial96                     triphasic         +--------+------------------+-----+---------+--------+  PTA     103               1.07 triphasic          +--------+------------------+-----+---------+--------+ DP      113               1.18 triphasic         +--------+------------------+-----+---------+--------+ +--------+------------------+-----+---------+----------------------------------+ Left    Lt Pressure (mmHg)IndexWaveform Comment                            +--------+------------------+-----+---------+----------------------------------+ Brachial                       triphasicUnable to obtain presure due to IV                                         placement                          +--------+------------------+-----+---------+----------------------------------+ PTA     100               1.04 biphasic                                    +--------+------------------+-----+---------+----------------------------------+ DP      100               1.04 biphasic                                    +--------+------------------+-----+---------+----------------------------------+ +-------+---------------+----------------+ ABI/TBIToday's ABI/TBIPrevious ABI/TBI +-------+---------------+----------------+ Right  1.18                            +-------+---------------+----------------+ Left   1.04                            +-------+---------------+----------------+  Summary: Right Carotid: There is no evidence of stenosis in the right ICA. Left Carotid: Velocities in the left ICA are consistent with a 1-39% stenosis. Vertebrals:  Bilateral vertebral arteries demonstrate antegrade flow. Subclavians: Normal flow hemodynamics were seen in bilateral subclavian              arteries.  *See table(s) above for measurements and observations. Right ABI: Resting right ankle-brachial index is within normal range. No evidence of significant right lower extremity arterial disease. Left ABI: Resting left ankle-brachial index is within normal range. No evidence of significant left lower extremity arterial disease.  Electronically signed  by Deitra Mayo MD on 06/29/2018 at 2:34:06 PM.    Final    Vas [place] Lower Extremity Venous (dvt)  Result Date: 06/29/2018  Lower Venous Study Other Indications: Pre Op LVAD. Performing Technologist: Toma Copier RVS  Examination Guidelines: A complete evaluation includes B-mode imaging, spectral Doppler, color Doppler, and power Doppler as needed of all accessible portions of each vessel. Bilateral testing is considered an integral part of a complete examination. Limited examinations for reoccurring indications may be performed as noted.  Right Venous Findings: +---------+---------------+---------+-----------+----------+------------------+          CompressibilityPhasicitySpontaneityPropertiesSummary            +---------+---------------+---------+-----------+----------+------------------+  CFV      Full           Yes      Yes                                     +---------+---------------+---------+-----------+----------+------------------+ SFJ      Full                                                            +---------+---------------+---------+-----------+----------+------------------+ FV Prox  Full           Yes      Yes                                     +---------+---------------+---------+-----------+----------+------------------+ FV Mid   Full                                                            +---------+---------------+---------+-----------+----------+------------------+ FV DistalFull           Yes      Yes                                     +---------+---------------+---------+-----------+----------+------------------+ PFV      Full           Yes      Yes                                     +---------+---------------+---------+-----------+----------+------------------+ POP      Full           Yes      Yes                                     +---------+---------------+---------+-----------+----------+------------------+ PTV       Full                                                            +---------+---------------+---------+-----------+----------+------------------+ PERO     Full                                         difficult to  visualize          +---------+---------------+---------+-----------+----------+------------------+  Left Venous Findings: +---------+---------------+---------+-----------+----------+-------+          CompressibilityPhasicitySpontaneityPropertiesSummary +---------+---------------+---------+-----------+----------+-------+ CFV      Full           Yes      Yes                          +---------+---------------+---------+-----------+----------+-------+ SFJ      Full                                                 +---------+---------------+---------+-----------+----------+-------+ FV Prox  Full           Yes      Yes                          +---------+---------------+---------+-----------+----------+-------+ FV Mid   Full                                                 +---------+---------------+---------+-----------+----------+-------+ FV DistalFull           Yes      Yes                          +---------+---------------+---------+-----------+----------+-------+ PFV      Full           Yes      Yes                          +---------+---------------+---------+-----------+----------+-------+ POP      Full           Yes      Yes                          +---------+---------------+---------+-----------+----------+-------+ PTV      Full                                                 +---------+---------------+---------+-----------+----------+-------+ PERO     Full                                                 +---------+---------------+---------+-----------+----------+-------+    Summary: Right: There is no evidence of deep vein thrombosis in the lower  extremity. No cystic structure found in the popliteal fossa. Left: There is no evidence of deep vein thrombosis in the lower extremity. No cystic structure found in the popliteal fossa.  *See table(s) above for measurements and observations. Electronically signed by Deitra Mayo MD on 06/29/2018 at 2:33:53 PM.    Final    Discharge Medications   Allergies as of 06/30/2018      Reactions   Amiodarone    Headache and facial swelling   Bee Venom Anaphylaxis   Mexiletine    Dr. Haroldine Laws  and Caryl Comes aware and are giving new trial on 06/30/18   Cortisone Swelling   Robaxin [methocarbamol] Hives, Itching      Medication List    STOP taking these medications   clopidogrel 75 MG tablet Commonly known as:  PLAVIX   sacubitril-valsartan 24-26 MG Commonly known as:  ENTRESTO     TAKE these medications   albuterol 108 (90 Base) MCG/ACT inhaler Commonly known as:  PROVENTIL HFA;VENTOLIN HFA Inhale 2 puffs into the lungs every 6 (six) hours as needed for wheezing or shortness of breath.   ALPRAZolam 1 MG tablet Commonly known as:  XANAX Take 1 tablet (1 mg total) by mouth at bedtime.   aspirin 81 MG tablet Take 1 tablet (81 mg total) by mouth daily.   atorvastatin 10 MG tablet Commonly known as:  LIPITOR Take 1 tablet (10 mg total) by mouth daily.   carvedilol 3.125 MG tablet Commonly known as:  COREG Take 1 tablet (3.125 mg total) by mouth 2 (two) times daily with a meal. What changed:    medication strength  how much to take  when to take this   cholecalciferol 1000 units tablet Commonly known as:  VITAMIN D Take 1,000 Units by mouth daily.   cyclobenzaprine 5 MG tablet Commonly known as:  FLEXERIL Take 1 tablet (5 mg total) by mouth at bedtime.   digoxin 0.125 MG tablet Commonly known as:  LANOXIN Take 1 tablet (0.125 mg total) by mouth every other day.   fenofibrate 145 MG tablet Commonly known as:  TRICOR Take 1 tablet (145 mg total) by mouth daily.     furosemide 20 MG tablet Commonly known as:  LASIX Take 20 mg by mouth daily.   losartan 25 MG tablet Commonly known as:  COZAAR Take 0.5 tablets (12.5 mg total) by mouth at bedtime.   mexiletine 200 MG capsule Commonly known as:  MEXITIL Take 1 capsule (200 mg total) by mouth 3 (three) times daily.   oxyCODONE 15 MG immediate release tablet Commonly known as:  ROXICODONE Take 15 mg by mouth every 8 (eight) hours.   potassium chloride SA 20 MEQ tablet Commonly known as:  K-DUR,KLOR-CON Take 1 tablet (20 mEq total) by mouth daily.      Disposition   The patient will be discharged in stable condition to home with plan for readmission 07/05/18 to re-challenge on amiodarone.   Discharge Instructions    (HEART FAILURE PATIENTS) Call MD:  Anytime you have any of the following symptoms: 1) 3 pound weight gain in 24 hours or 5 pounds in 1 week 2) shortness of breath, with or without a dry hacking cough 3) swelling in the hands, feet or stomach 4) if you have to sleep on extra pillows at night in order to breathe.   Complete by:  As directed    (HEART FAILURE PATIENTS) Call MD:  Anytime you have any of the following symptoms: 1) 3 pound weight gain in 24 hours or 5 pounds in 1 week 2) shortness of breath, with or without a dry hacking cough 3) swelling in the hands, feet or stomach 4) if you have to sleep on extra pillows at night in order to breathe.   Complete by:  As directed    Diet - low sodium heart healthy   Complete by:  As directed    Diet - low sodium heart healthy   Complete by:  As directed    Increase activity slowly   Complete by:  As directed    Increase activity slowly   Complete by:  As directed    STOP any activity that causes chest pain, shortness of breath, dizziness, sweating, or exessive weakness   Complete by:  As directed    STOP any activity that causes chest pain, shortness of breath, dizziness, sweating, or exessive weakness   Complete by:  As directed       Follow-up Information    [facility] up on 07/05/2018.   Why:  For re-admission. Contact information: [place] [phone]           Duration of Discharge Encounter: Greater than 35 minutes   Signed, Annamaria Helling 06/30/2018, 1:54 PM   Patient seen and examined with the above-signed Advanced Practice Provider and/or Housestaff. I personally reviewed laboratory data, imaging studies and relevant notes. I independently examined the patient and formulated the important aspects of the plan. I have edited the note to reflect any of my changes or salient points. I have personally discussed the plan with the patient and/or family.  Agree with above. Will start mexilitene to help suppress ventricular ectopy. Will readmit on Monday for amiodarone load (has possible allergy). Hopefully LVEF will improve with suppression of ectopy. If unable to tolerate AA therapy or fails to suppress ectopy will need VAD.   Glori Bickers, MD  5:48 PM

## 2018-06-30 NOTE — Telephone Encounter (Signed)
Error

## 2018-06-30 NOTE — Progress Notes (Addendum)
Patient ID: [name], female   DOB: [birth year], 52 y.o.   MRN: [id number]     Advanced Heart Failure Rounding Note  PCP-Cardiologist: No primary care provider on file.   Subjective:    Discussed at [facility] 06/28/18. Approved to move forward with LVAD work up.   Feeling OK this am. Denies SOB. No lightheadedness or dizziness. Anxious to go home.   Cr 0.81. PFTs performed at bedside this am due to left main disease. Does not include DLCO.   CT 06/28/18 with possible liver mass.   [place] 06/29/18 RA = 5 RV = 34/5 PA = 40/11 (21) PCW = 9 Fick cardiac output/index = 4.3/2.1 PVR = 2.8 WU Ao sat = 93% PA sat = 48%, 56%  Assessment: Normal filling pressures with moderately reduced output  PFTs 06/29/18 FVC 1.73 (56%) FEV1 1.49 (61%)  LHC 06/24/18  Dist LM lesion is 65% stenosed.  Prox RCA to Mid RCA lesion is 100% stenosed.  Ost Ramus lesion is 60% stenosed.  Non-stenotic Ramus lesion.  Prox LAD lesion is 70% stenosed.   Findings:  Ao = 79/58 (67) LV =  82/12  1. LM: Distal 60-70% lesion 2. LAD: 70% mid 3. Ramus: 60% prox 4. RCA 100% mid in stent with L -> R colalterals  EF 15% with severe global HK  Objective:   Weight Range: 92.5 kg Body mass index is 32.93 kg/m.   Vital Signs:   Temp:  [97.3 F (36.3 C)-98.5 F (36.9 C)] 98.5 F (36.9 C) (11/13 0441) Pulse Rate:  [42-100] 74 (11/13 0441) Resp:  [11-36] 18 (11/13 0441) BP: (76-124)/(55-111) 76/65 (11/13 0441) SpO2:  [84 %-100 %] 95 % (11/13 0441) Weight:  [92.5 kg] 92.5 kg (11/13 0441) Last BM Date: 06/27/18  Weight change: Filed Weights   06/28/18 0500 06/29/18 0417 06/30/18 0441  Weight: 92.9 kg 93 kg 92.5 kg    Intake/Output:   Intake/Output Summary (Last 24 hours) at 06/30/2018 0912 Last data filed at 06/30/2018 0451 Gross per 24 hour  Intake 422 ml  Output 1575 ml  Net -1153 ml     Physical Exam    General: NAD HEENT: Normal Neck: Supple. JVP 6-7 cm. Carotids 2+  bilat; no bruits. No thyromegaly or nodule noted. Cor: PMI lateral. Irregular due to frequent ectopy. No M/G/R noted Lungs: CTAB, normal effort. Abdomen: Soft, non-tender, non-distended, no HSM. No bruits or masses. +BS  Extremities: No cyanosis, clubbing, or rash. R and LLE no edema.  Neuro: Alert & orientedx3, cranial nerves grossly intact. moves all 4 extremities w/o difficulty. Affect pleasant   Telemetry   NSR "70-80" though with very frequent PVCs. Seldom has 3-4 beats of NSR in a row, At times, upwards of 40-60 PVCs a minute.   EKG   No new tracings.    Labs    CBC Recent Labs    06/29/18 0643 06/30/18 0024  WBC 5.1 4.9  HGB 11.3* 11.5*  HCT 35.8* 38.2  MCV 83.8 84.3  PLT 232 427   Basic Metabolic Panel Recent Labs    06/29/18 0407 06/30/18 0024  NA 135 136  K 3.7 3.8  CL 106 105  CO2 21* 26  GLUCOSE 104* 103*  BUN 7 9  CREATININE 0.81 0.80  CALCIUM 8.4* 9.1  MG  --  2.0   Liver Function Tests No results for input(s): AST, ALT, ALKPHOS, BILITOT, PROT, ALBUMIN in the last 72 hours. No results for input(s): LIPASE, AMYLASE in the last 72  hours. Cardiac Enzymes No results for input(s): CKTOTAL, CKMB, CKMBINDEX, TROPONINI in the last 72 hours.  BNP: BNP (last 3 results) Recent Labs    12/25/17 0017 06/22/18 1526  BNP 113.0* 615.0*   ProBNP (last 3 results) No results for input(s): PROBNP in the last 8760 hours.  D-Dimer No results for input(s): DDIMER in the last 72 hours. Hemoglobin A1C No results for input(s): HGBA1C in the last 72 hours. Fasting Lipid Panel Recent Labs    06/28/18 0640  CHOL 92  HDL 23*  LDLCALC 49  TRIG 102  CHOLHDL 4.0   Thyroid Function Tests No results for input(s): TSH, T4TOTAL, T3FREE, THYROIDAB in the last 72 hours.  Invalid input(s): FREET3  Other results:  Imaging   No results found.  Medications:    Scheduled Medications: . ALPRAZolam  1 mg Oral QHS  . aspirin EC  81 mg Oral Daily  .  atorvastatin  40 mg Oral Daily  . carvedilol  3.125 mg Oral BID WC  . cholecalciferol  1,000 Units Oral Daily  . digoxin  0.125 mg Oral QODAY  . [START ON 07/01/2018] enoxaparin (LOVENOX) injection  40 mg Subcutaneous Q24H  . feeding supplement  1 Container Oral BID BM  . fenofibrate  54 mg Oral Daily  . furosemide  20 mg Oral Daily  . losartan  12.5 mg Oral QHS  .  morphine injection  2 mg Intravenous Once  . multivitamin with minerals  1 tablet Oral Daily  . potassium chloride  20 mEq Oral Daily  . sodium chloride flush  3 mL Intravenous Q12H  . sodium chloride flush  3 mL Intravenous Q12H  . sodium chloride flush  3 mL Intravenous Q12H    Infusions: . sodium chloride    . sodium chloride    . sodium chloride      PRN Medications: sodium chloride, sodium chloride, sodium chloride, acetaminophen, albuterol, benzonatate, guaiFENesin-dextromethorphan, ondansetron (ZOFRAN) IV, sodium chloride flush, sodium chloride flush, sodium chloride flush  Patient Profile   [name] is a 52 y.o. female with CAD s/p PCI to RCA 2011, mixed ICM/NICM NYHA II, ACC B (EF 20%, s/p ICD) c/b NSVT, SLE, GERD, and ongoing tobacco use who is admitted with hypotension and cough.   Assessment/Plan   1. NSTEMI in CAD - s/p remote stent. Last cath 2004. Peak trop 30.3 - Cath 06/24/18 with in-stent stenosis of mRCA stent and significant LM disease.  - Discussed with TCTS and interventional team. Not candidate for CABG or PCI. Have decided to proceed with VAD - No further CP. Off heparin. Working up for LVAD as below.  - Continue ASA and statin.  2. Acute on chronic systolic CHF - Echo 6/73/41 LVEF 15-20%, Grade 2 DD, Mod MR, Severe LAE, Mod RV reduction - Volume status stable.  - Continue Coreg 3.125 mg BID.  - Continue losartan 12.5 mg qhs with hypotension.  - Continue digoxin 0.125 mg daily. Level ok 06/22/18  - Patient now interested in VAD. W/u underway. Patient concerned about timing,  has a lot of classwork to do.   - Approved for continued LVAD work up at [facility] 06/28/18. She is aware that if she is going to get an LVAD, she will need to think about taking the rest of the semester off. - Liver lesion likely benign. AFP negative.  3. NSVT/VT - Managed by Dr. Caryl Comes. S/p MDT ICD.  - Intolerant to amiodarone and mexiletine - s/p ICD shock 12/2017 in setting  of electrolytes. Can drive again this month.  - Continues to have frequent NSVT/PVCs, but no VT. Suspect contributing to cardiomyopathy.  - Dr. Haroldine Laws to discuss more aggressive antiarrythmic therapy with Dr. Caryl Comes.  - ZIO patch 8-04/2018 with predominantly sinus rhythm with > 7900 runs of NSVT/VT, longest 30 seconds. - Continue coreg 3.125 mg BID as tolerated.   4. Tobacco abuse - Encouraged complete cessation. No change.  - PFTs 06/29/18 FVC 1.73 (56%), FEV1 1.49 (61%)  5. Severe protein calorie malnutrition - Prealbumin 8.9. Continue Ensure TID. No change.   6. Liver mass - concern for possible [facility] - CT Radiologist reviewed and chronic and likely benign.  - unable to get MRI due to ICD - HCV Ab 0.1. Will check HCV RNA - AFP negative.  Length of Stay: [place], [place]  06/30/2018, 9:12 AM  Advanced Heart Failure Team Pager [phone] (M-F; 7a - 4p)  Please contact [facility] for night-coverage after hours (4p -7a ) and weekends on [email or web address]  Patient seen and examined with the above-signed Advanced Practice Provider and/or Housestaff. I personally reviewed laboratory data, imaging studies and relevant notes. I independently examined the patient and formulated the important aspects of the plan. I have edited the note to reflect any of my changes or salient points. I have personally discussed the plan with the patient and/or family.  Daisy data reviewed with her. Fairly well compensated. Had extensive ectopy during cath. I suspect that cardiomyopathy is due in large part to ectopy. I have  discussed with Dr. Caryl Comes, who knows her well, will start mexilitene today and let her go home. She has potential allergy to amio so will admit on Monday of next week for IV load. If able to suppress ectopy and tolerates well will wait 1-2 months to see if EF gets better. If unable to tolerate or suppress ectopy then will need VAD sooner.   Glori Bickers, MD  1:08 PM

## 2018-06-30 NOTE — Progress Notes (Signed)
Patient at the request of Dr. Reine Just.  Currently about 95% ventricular beats and what appears to be a triggered pattern.  Chart review 11/18 describes shortness of breath headache and a swollen eye within 48 hours of initiation of amiodarone.  Given the potential consequences of ventricular ectopy or cardiomyopathy we would like to attempt one more effort of antiarrhythmic suppression and would like to use mexiletine and amiodarone.  She is agreeable but would like to go home for a few days.  We will readmit her Monday.  And start mexiletine in the interim which she had tolerated previously

## 2018-06-30 NOTE — Progress Notes (Signed)
Left voicemail at Dr. Olevia Perches office requesting colonoscopy records. Dr. Olevia Perches office number is [phone].  Emerson Monte RN [place] Coordinator  Office: [phone]  24/7 Pager: [phone]

## 2018-07-01 ENCOUNTER — Telehealth: Payer: Self-pay

## 2018-07-01 LAB — HCV RNA QUANT: HCV QUANT: NOT DETECTED [IU]/mL (ref >50)

## 2018-07-01 NOTE — Telephone Encounter (Signed)
Transition Care Management Follow-up Telephone Call   Date discharged?  06-30-18              How have you been since you were released from the hospital? Feeling fine, not tired or light headed    Do you understand why you were in the hospital? Yes I do. Patient able to describe    Do you understand the discharge instructions? Yes    Where were you discharged to? Home for the weekend, being readmitted on Monday 07-05-18 for medication changes and observation    Items Reviewed:  Medications reviewed: Yes   Allergies reviewed: Yes   Dietary changes reviewed: Heart healthy diet   Referrals reviewed: None    Functional Questionnaire:   Activities of Daily Living (ADLs):  Able to perform     Any transportation issues/concerns?: None    Any patient concerns? No concerns other    Confirmed importance and date/time of follow-up visits scheduled Done      Confirmed with patient if condition begins to worsen call PCP or go to the ER.  Patient was given the office number and encouraged to call back with question or concerns.  :

## 2018-07-02 ENCOUNTER — Encounter (HOSPITAL_COMMUNITY): Payer: Medicare Other

## 2018-07-02 LAB — FACTOR 5 LEIDEN

## 2018-07-03 ENCOUNTER — Other Ambulatory Visit: Payer: Self-pay | Admitting: Cardiology

## 2018-07-03 ENCOUNTER — Telehealth: Payer: Self-pay | Admitting: Cardiology

## 2018-07-03 DIAGNOSIS — K219 Gastro-esophageal reflux disease without esophagitis: Secondary | ICD-10-CM

## 2018-07-03 MED ORDER — PANTOPRAZOLE SODIUM 40 MG PO TBEC: 40.0000 mg | DELAYED_RELEASE_TABLET | Freq: Every day | ORAL | 0 refills | Status: DC

## 2018-07-03 NOTE — Progress Notes (Signed)
Rx for protonix 40 mg daily sent. #30. No refills.

## 2018-07-03 NOTE — Telephone Encounter (Signed)
  Received outpatient phone call from patient bothered with symptoms of indigestion and acid reflux.  She is followed by Dr. Haroldine Laws in the advanced heart failure clinic and was recently discharged from the hospital on 06/30/2018.  Based on the characteristics of her symptoms, this does seem in line with acid reflux and noncardiac.  She has postprandial burning chest discomfort that is temporarily relieved with Tums however the pain later returns.  I personally discussed case with Dr. Haroldine Laws and he has given approval to call in Rx for Protonix 40 mg once daily.  Electronic prescription sent to patient's pharmacy, [facility].

## 2018-07-05 ENCOUNTER — Telehealth: Payer: Self-pay | Admitting: Internal Medicine

## 2018-07-05 ENCOUNTER — Inpatient Hospital Stay (HOSPITAL_COMMUNITY)
Admission: RE | Admit: 2018-07-05 | Discharge: 2018-07-09 | DRG: 281 | Disposition: A | Discharge status: Home / Self Care | Payer: Medicare Other | Attending: Internal Medicine | Admitting: Internal Medicine

## 2018-07-05 DIAGNOSIS — Z95810 Presence of automatic (implantable) cardiac defibrillator: Secondary | ICD-10-CM | POA: Diagnosis not present

## 2018-07-05 DIAGNOSIS — K219 Gastro-esophageal reflux disease without esophagitis: Secondary | ICD-10-CM | POA: Diagnosis present

## 2018-07-05 DIAGNOSIS — M329 Systemic lupus erythematosus, unspecified: Secondary | ICD-10-CM | POA: Diagnosis present

## 2018-07-05 DIAGNOSIS — I428 Other cardiomyopathies: Secondary | ICD-10-CM | POA: Diagnosis present

## 2018-07-05 DIAGNOSIS — F17210 Nicotine dependence, cigarettes, uncomplicated: Secondary | ICD-10-CM | POA: Diagnosis present

## 2018-07-05 DIAGNOSIS — Z90710 Acquired absence of both cervix and uterus: Secondary | ICD-10-CM | POA: Diagnosis not present

## 2018-07-05 DIAGNOSIS — N052 Unspecified nephritic syndrome with diffuse membranous glomerulonephritis: Secondary | ICD-10-CM | POA: Diagnosis not present

## 2018-07-05 DIAGNOSIS — E119 Type 2 diabetes mellitus without complications: Secondary | ICD-10-CM | POA: Diagnosis not present

## 2018-07-05 DIAGNOSIS — Z8349 Family history of other endocrine, nutritional and metabolic diseases: Secondary | ICD-10-CM | POA: Diagnosis not present

## 2018-07-05 DIAGNOSIS — I214 Non-ST elevation (NSTEMI) myocardial infarction: Secondary | ICD-10-CM | POA: Diagnosis not present

## 2018-07-05 DIAGNOSIS — Z79891 Long term (current) use of opiate analgesic: Secondary | ICD-10-CM

## 2018-07-05 DIAGNOSIS — I472 Ventricular tachycardia, unspecified: Secondary | ICD-10-CM

## 2018-07-05 DIAGNOSIS — I493 Ventricular premature depolarization: Secondary | ICD-10-CM | POA: Diagnosis present

## 2018-07-05 DIAGNOSIS — I2510 Atherosclerotic heart disease of native coronary artery without angina pectoris: Secondary | ICD-10-CM | POA: Diagnosis present

## 2018-07-05 DIAGNOSIS — I255 Ischemic cardiomyopathy: Secondary | ICD-10-CM | POA: Diagnosis present

## 2018-07-05 DIAGNOSIS — I252 Old myocardial infarction: Secondary | ICD-10-CM

## 2018-07-05 DIAGNOSIS — G8929 Other chronic pain: Secondary | ICD-10-CM | POA: Diagnosis present

## 2018-07-05 DIAGNOSIS — Z833 Family history of diabetes mellitus: Secondary | ICD-10-CM | POA: Diagnosis not present

## 2018-07-05 DIAGNOSIS — Z91030 Bee allergy status: Secondary | ICD-10-CM

## 2018-07-05 DIAGNOSIS — K769 Liver disease, unspecified: Secondary | ICD-10-CM | POA: Diagnosis not present

## 2018-07-05 DIAGNOSIS — Z8249 Family history of ischemic heart disease and other diseases of the circulatory system: Secondary | ICD-10-CM | POA: Diagnosis not present

## 2018-07-05 DIAGNOSIS — I471 Supraventricular tachycardia: Secondary | ICD-10-CM | POA: Diagnosis not present

## 2018-07-05 DIAGNOSIS — Z7902 Long term (current) use of antithrombotics/antiplatelets: Secondary | ICD-10-CM

## 2018-07-05 DIAGNOSIS — Z888 Allergy status to other drugs, medicaments and biological substances status: Secondary | ICD-10-CM

## 2018-07-05 DIAGNOSIS — M1710 Unilateral primary osteoarthritis, unspecified knee: Secondary | ICD-10-CM | POA: Diagnosis not present

## 2018-07-05 DIAGNOSIS — I5022 Chronic systolic (congestive) heart failure: Secondary | ICD-10-CM | POA: Diagnosis not present

## 2018-07-05 DIAGNOSIS — Z79899 Other long term (current) drug therapy: Secondary | ICD-10-CM

## 2018-07-05 DIAGNOSIS — Z9861 Coronary angioplasty status: Secondary | ICD-10-CM | POA: Diagnosis not present

## 2018-07-05 DIAGNOSIS — Z7982 Long term (current) use of aspirin: Secondary | ICD-10-CM

## 2018-07-05 DIAGNOSIS — E785 Hyperlipidemia, unspecified: Secondary | ICD-10-CM | POA: Diagnosis not present

## 2018-07-05 LAB — CBC WITH DIFFERENTIAL/PLATELET
ABS IMMATURE GRANULOCYTES: 0.01 10*3/uL (ref 0.00–0.07)
Basophils Absolute: 0 10*3/uL (ref 0.0–0.1)
Basophils Relative: 1 %
EOS PCT: 3 %
Eosinophils Absolute: 0.2 10*3/uL (ref 0.0–0.5)
HEMATOCRIT: 39.7 % (ref 36.0–46.0)
HEMOGLOBIN: 11.9 g/dL — AB (ref 12.0–15.0)
Immature Granulocytes: 0 %
LYMPHS ABS: 2.5 10*3/uL (ref 0.7–4.0)
LYMPHS PCT: 44 %
MCH: 25.9 pg — AB (ref 26.0–34.0)
MCHC: 30 g/dL (ref 30.0–36.0)
MCV: 86.5 fL (ref 80.0–100.0)
MONOS PCT: 7 %
Monocytes Absolute: 0.4 10*3/uL (ref 0.1–1.0)
Neutro Abs: 2.6 10*3/uL (ref 1.7–7.7)
Neutrophils Relative %: 45 %
Platelets: 362 10*3/uL (ref 150–400)
RBC: 4.59 MIL/uL (ref 3.87–5.11)
RDW: 13.9 % (ref 11.5–15.5)
WBC: 5.6 10*3/uL (ref 4.0–10.5)
nRBC: 0 % (ref 0.0–0.2)

## 2018-07-05 LAB — COMPREHENSIVE METABOLIC PANEL
ALT: 21 U/L (ref 0–44)
AST: 33 U/L (ref 15–41)
Albumin: 3.7 g/dL (ref 3.5–5.0)
Alkaline Phosphatase: 55 U/L (ref 38–126)
Anion gap: 6 (ref 5–15)
BUN: 11 mg/dL (ref 6–20)
CHLORIDE: 107 mmol/L (ref 98–111)
CO2: 23 mmol/L (ref 22–32)
CREATININE: 0.96 mg/dL (ref 0.44–1.00)
Calcium: 8.9 mg/dL (ref 8.9–10.3)
GFR calc Af Amer: >60 mL/min (ref >60)
Glucose, Bld: 113 mg/dL — ABNORMAL HIGH (ref 70–99)
Potassium: 4.4 mmol/L (ref 3.5–5.1)
Sodium: 136 mmol/L (ref 135–145)
Total Bilirubin: 0.6 mg/dL (ref 0.3–1.2)
Total Protein: 7.8 g/dL (ref 6.5–8.1)

## 2018-07-05 LAB — MAGNESIUM: Magnesium: 2 mg/dL (ref 1.7–2.4)

## 2018-07-05 LAB — BRAIN NATRIURETIC PEPTIDE: B Natriuretic Peptide: 730.3 pg/mL — ABNORMAL HIGH (ref 0.0–100.0)

## 2018-07-05 LAB — TSH: TSH: 4.44 u[IU]/mL (ref 0.350–4.500)

## 2018-07-05 MED ORDER — SODIUM CHLORIDE 0.9% FLUSH
3.0000 mL | Freq: Two times a day (BID) | INTRAVENOUS | Status: DC
  Administered 2018-07-05 – 2018-07-09 (×6): 3 mL via INTRAVENOUS

## 2018-07-05 MED ORDER — SODIUM CHLORIDE 0.9 % IV SOLN: 250.0000 mL | INTRAVENOUS | Status: DC | PRN

## 2018-07-05 MED ORDER — ENOXAPARIN SODIUM 40 MG/0.4ML ~~LOC~~ SOLN
40.0000 mg | SUBCUTANEOUS | Status: DC
  Administered 2018-07-05 – 2018-07-08 (×4): 40 mg via SUBCUTANEOUS
  Filled 2018-07-05 (×4): qty 0.4

## 2018-07-05 MED ORDER — AMIODARONE HCL IN DEXTROSE 360-4.14 MG/200ML-% IV SOLN: 60.0000 mg/h | INTRAVENOUS | Status: DC

## 2018-07-05 MED ORDER — MEXILETINE HCL 200 MG PO CAPS
200.0000 mg | ORAL_CAPSULE | Freq: Three times a day (TID) | ORAL | Status: DC
  Administered 2018-07-05 – 2018-07-09 (×12): 200 mg via ORAL
  Filled 2018-07-05 (×13): qty 1

## 2018-07-05 MED ORDER — FUROSEMIDE 20 MG PO TABS
20.0000 mg | ORAL_TABLET | Freq: Every day | ORAL | Status: DC
  Administered 2018-07-06 – 2018-07-09 (×4): 20 mg via ORAL
  Filled 2018-07-05 (×4): qty 1

## 2018-07-05 MED ORDER — AMIODARONE HCL IN DEXTROSE 360-4.14 MG/200ML-% IV SOLN
30.0000 mg/h | INTRAVENOUS | Status: DC
  Administered 2018-07-06 – 2018-07-07 (×3): 30 mg/h via INTRAVENOUS
  Filled 2018-07-05 (×3): qty 200

## 2018-07-05 MED ORDER — AMIODARONE HCL IN DEXTROSE 360-4.14 MG/200ML-% IV SOLN
INTRAVENOUS | Status: AC
  Filled 2018-07-05: qty 200

## 2018-07-05 MED ORDER — SACUBITRIL-VALSARTAN 24-26 MG PO TABS: 1.0000 | ORAL_TABLET | Freq: Two times a day (BID) | ORAL | Status: DC

## 2018-07-05 MED ORDER — CLOPIDOGREL BISULFATE 75 MG PO TABS: 75.0000 mg | ORAL_TABLET | Freq: Every day | ORAL | Status: DC

## 2018-07-05 MED ORDER — ALPRAZOLAM 0.5 MG PO TABS
1.0000 mg | ORAL_TABLET | Freq: Every day | ORAL | Status: DC
  Administered 2018-07-06 – 2018-07-08 (×2): 1 mg via ORAL
  Filled 2018-07-05 (×2): qty 2

## 2018-07-05 MED ORDER — ACETAMINOPHEN 325 MG PO TABS: 650.0000 mg | ORAL_TABLET | ORAL | Status: DC | PRN

## 2018-07-05 MED ORDER — AMIODARONE HCL IN DEXTROSE 360-4.14 MG/200ML-% IV SOLN: 30.0000 mg/h | INTRAVENOUS | Status: DC

## 2018-07-05 MED ORDER — VITAMIN D 25 MCG (1000 UNIT) PO TABS
1000.0000 [IU] | ORAL_TABLET | Freq: Every day | ORAL | Status: DC
  Administered 2018-07-06 – 2018-07-09 (×4): 1000 [IU] via ORAL
  Filled 2018-07-05 (×4): qty 1

## 2018-07-05 MED ORDER — FAMOTIDINE 20 MG PO TABS
20.0000 mg | ORAL_TABLET | Freq: Two times a day (BID) | ORAL | Status: DC
  Administered 2018-07-05 – 2018-07-09 (×9): 20 mg via ORAL
  Filled 2018-07-05 (×10): qty 1

## 2018-07-05 MED ORDER — ATORVASTATIN CALCIUM 10 MG PO TABS
10.0000 mg | ORAL_TABLET | Freq: Every day | ORAL | Status: DC
  Administered 2018-07-05 – 2018-07-09 (×5): 10 mg via ORAL
  Filled 2018-07-05 (×5): qty 1

## 2018-07-05 MED ORDER — ASPIRIN EC 81 MG PO TBEC: 81.0000 mg | DELAYED_RELEASE_TABLET | Freq: Every day | ORAL | Status: DC

## 2018-07-05 MED ORDER — LOSARTAN POTASSIUM 25 MG PO TABS
12.5000 mg | ORAL_TABLET | Freq: Every day | ORAL | Status: DC
  Administered 2018-07-05 – 2018-07-08 (×3): 12.5 mg via ORAL
  Filled 2018-07-05 (×3): qty 1

## 2018-07-05 MED ORDER — SODIUM CHLORIDE 0.9% FLUSH: 3.0000 mL | INTRAVENOUS | Status: DC | PRN

## 2018-07-05 MED ORDER — ENOXAPARIN SODIUM 40 MG/0.4ML ~~LOC~~ SOLN: 40.0000 mg | SUBCUTANEOUS | Status: DC

## 2018-07-05 MED ORDER — ONDANSETRON HCL 4 MG/2ML IJ SOLN
4.0000 mg | Freq: Four times a day (QID) | INTRAMUSCULAR | Status: DC | PRN
  Administered 2018-07-06 (×2): 4 mg via INTRAVENOUS
  Filled 2018-07-05 (×2): qty 2

## 2018-07-05 MED ORDER — FENOFIBRATE 160 MG PO TABS
160.0000 mg | ORAL_TABLET | Freq: Every day | ORAL | Status: DC
  Administered 2018-07-06 – 2018-07-09 (×4): 160 mg via ORAL
  Filled 2018-07-05 (×4): qty 1

## 2018-07-05 MED ORDER — PANTOPRAZOLE SODIUM 40 MG PO TBEC
40.0000 mg | DELAYED_RELEASE_TABLET | Freq: Every day | ORAL | Status: DC
  Administered 2018-07-05 – 2018-07-09 (×5): 40 mg via ORAL
  Filled 2018-07-05 (×5): qty 1

## 2018-07-05 MED ORDER — PANTOPRAZOLE SODIUM 40 MG PO TBEC: 40.0000 mg | DELAYED_RELEASE_TABLET | Freq: Every day | ORAL | Status: DC

## 2018-07-05 MED ORDER — POTASSIUM CHLORIDE CRYS ER 20 MEQ PO TBCR: 20.0000 meq | EXTENDED_RELEASE_TABLET | Freq: Every day | ORAL | Status: DC

## 2018-07-05 MED ORDER — ASPIRIN EC 81 MG PO TBEC
81.0000 mg | DELAYED_RELEASE_TABLET | Freq: Every day | ORAL | Status: DC
  Administered 2018-07-05 – 2018-07-09 (×5): 81 mg via ORAL
  Filled 2018-07-05 (×5): qty 1

## 2018-07-05 MED ORDER — AMIODARONE HCL IN DEXTROSE 360-4.14 MG/200ML-% IV SOLN
60.0000 mg/h | INTRAVENOUS | Status: AC
  Administered 2018-07-05 (×2): 60 mg/h via INTRAVENOUS
  Filled 2018-07-05: qty 200

## 2018-07-05 MED ORDER — DIGOXIN 125 MCG PO TABS
0.1250 mg | ORAL_TABLET | ORAL | Status: DC
  Administered 2018-07-06 – 2018-07-08 (×2): 0.125 mg via ORAL
  Filled 2018-07-05 (×2): qty 1

## 2018-07-05 MED ORDER — ALBUTEROL SULFATE (2.5 MG/3ML) 0.083% IN NEBU: 3.0000 mL | INHALATION_SOLUTION | Freq: Four times a day (QID) | RESPIRATORY_TRACT | Status: DC | PRN

## 2018-07-05 MED ORDER — CARVEDILOL 12.5 MG PO TABS: 12.5000 mg | ORAL_TABLET | Freq: Two times a day (BID) | ORAL | Status: DC

## 2018-07-05 MED ORDER — AMIODARONE LOAD VIA INFUSION
150.0000 mg | Freq: Once | INTRAVENOUS | Status: DC
  Filled 2018-07-05: qty 83.34

## 2018-07-05 MED ORDER — OXYCODONE HCL 5 MG PO TABS
15.0000 mg | ORAL_TABLET | Freq: Three times a day (TID) | ORAL | Status: DC
  Administered 2018-07-05: 15 mg via ORAL
  Filled 2018-07-05 (×2): qty 3

## 2018-07-05 MED ORDER — CARVEDILOL 3.125 MG PO TABS
3.1250 mg | ORAL_TABLET | Freq: Two times a day (BID) | ORAL | Status: DC
  Administered 2018-07-05 – 2018-07-09 (×8): 3.125 mg via ORAL
  Filled 2018-07-05 (×9): qty 1

## 2018-07-05 MED ORDER — AMIODARONE LOAD VIA INFUSION
150.0000 mg | Freq: Once | INTRAVENOUS | Status: AC
  Administered 2018-07-05: 150 mg via INTRAVENOUS
  Filled 2018-07-05: qty 83.34

## 2018-07-05 MED ORDER — CYCLOBENZAPRINE HCL 10 MG PO TABS
5.0000 mg | ORAL_TABLET | Freq: Every day | ORAL | Status: DC
  Administered 2018-07-06 – 2018-07-08 (×3): 5 mg via ORAL
  Filled 2018-07-05 (×3): qty 1

## 2018-07-05 NOTE — Telephone Encounter (Signed)
Dr Caryl Comes has left Dr Haroldine Laws a message re admit Pt aware to have a seat in admitting lobby ./[facility]

## 2018-07-05 NOTE — Telephone Encounter (Signed)
° ° °  Patient calling, states she was told to return to [facility] today for re- admission.  She is at the hospital at this time, admissions has questions about  Request . Please call patient on mobile.

## 2018-07-06 ENCOUNTER — Other Ambulatory Visit: Payer: Self-pay

## 2018-07-06 ENCOUNTER — Encounter (HOSPITAL_COMMUNITY): Payer: Self-pay | Admitting: General Practice

## 2018-07-06 LAB — BASIC METABOLIC PANEL
ANION GAP: 6 (ref 5–15)
BUN: 11 mg/dL (ref 6–20)
CALCIUM: 8.9 mg/dL (ref 8.9–10.3)
CO2: 23 mmol/L (ref 22–32)
Chloride: 106 mmol/L (ref 98–111)
Creatinine, Ser: 1.01 mg/dL — ABNORMAL HIGH (ref 0.44–1.00)
GFR calc Af Amer: >60 mL/min (ref >60)
GFR calc non Af Amer: >60 mL/min (ref >60)
Glucose, Bld: 122 mg/dL — ABNORMAL HIGH (ref 70–99)
Potassium: 5 mmol/L (ref 3.5–5.1)
Sodium: 135 mmol/L (ref 135–145)

## 2018-07-06 MED ORDER — POTASSIUM CHLORIDE CRYS ER 20 MEQ PO TBCR
20.0000 meq | EXTENDED_RELEASE_TABLET | Freq: Every day | ORAL | Status: DC
  Administered 2018-07-07 – 2018-07-09 (×3): 20 meq via ORAL
  Filled 2018-07-06 (×3): qty 1

## 2018-07-06 NOTE — Progress Notes (Signed)
Advanced Heart Failure Rounding Note   Subjective:    Tolerating amiodarone well except nausea. No SOB, rash or tongue swelling. Ectopy seems to be quieting down some   Objective:   Weight Range:  Vital Signs:   Temp:  [97.5 F (36.4 C)-98.3 F (36.8 C)] 97.5 F (36.4 C) (11/19 0520) Pulse Rate:  [70-90] 70 (11/19 0520) Resp:  [16] 16 (11/18 1348) BP: (92-101)/(54-80) 101/80 (11/19 0520) SpO2:  [97 %-99 %] 99 % (11/19 0520) Weight:  [92.6 kg-92.8 kg] 92.8 kg (11/19 0520)    Weight change: Filed Weights   07/05/18 1334 07/06/18 0520  Weight: 92.6 kg 92.8 kg    Intake/Output:   Intake/Output Summary (Last 24 hours) at 07/06/2018 0727 Last data filed at 07/06/2018 0600 Gross per 24 hour  Intake 1220.99 ml  Output 1000 ml  Net 220.99 ml     Physical Exam: General:  Well appearing. No resp difficulty HEENT: normal Neck: supple. JVP . Carotids 2+ bilat; no bruits. No lymphadenopathy or thryomegaly appreciated. Cor: PMI laterally displaced. Irregular due to frequent ectopy . No rubs, gallops or murmurs. Lungs: clear Abdomen: soft, nontender, nondistended. No hepatosplenomegaly. No bruits or masses. Good bowel sounds. Extremities: no cyanosis, clubbing, rash, edema Neuro: alert & orientedx3, cranial nerves grossly intact. moves all 4 extremities w/o difficulty. Affect pleasant  Telemetry: NSR with ventricular ectopy. Personally reviewed   Labs: Basic Metabolic Panel: Recent Labs  Lab 06/30/18 0024 07/05/18 1430 07/06/18 0338  NA 136 136 135  K 3.8 4.4 5.0  CL 105 107 106  CO2 26 23 23   GLUCOSE 103* 113* 122*  BUN 9 11 11   CREATININE 0.80 0.96 1.01*  CALCIUM 9.1 8.9 8.9  MG 2.0 2.0  --     Liver Function Tests: Recent Labs  Lab 07/05/18 1430  AST 33  ALT 21  ALKPHOS 55  BILITOT 0.6  PROT 7.8  ALBUMIN 3.7   No results for input(s): LIPASE, AMYLASE in the last 168 hours. No results for input(s): AMMONIA in the last 168 hours.  CBC: Recent  Labs  Lab 06/30/18 0024 07/05/18 1430  WBC 4.9 5.6  NEUTROABS  --  2.6  HGB 11.5* 11.9*  HCT 38.2 39.7  MCV 84.3 86.5  PLT 258 362    Cardiac Enzymes: No results for input(s): CKTOTAL, CKMB, CKMBINDEX, TROPONINI in the last 168 hours.  BNP: BNP (last 3 results) Recent Labs    12/25/17 0017 06/22/18 1526 07/05/18 1430  BNP 113.0* 615.0* 730.3*    ProBNP (last 3 results) No results for input(s): PROBNP in the last 8760 hours.    Other results:  Imaging:  No results found.   Medications:     Scheduled Medications: . ALPRAZolam  1 mg Oral QHS  . aspirin EC  81 mg Oral Daily  . atorvastatin  10 mg Oral Daily  . carvedilol  3.125 mg Oral BID WC  . cholecalciferol  1,000 Units Oral Daily  . cyclobenzaprine  5 mg Oral QHS  . digoxin  0.125 mg Oral QODAY  . enoxaparin (LOVENOX) injection  40 mg Subcutaneous Q24H  . famotidine  20 mg Oral BID  . fenofibrate  160 mg Oral Daily  . furosemide  20 mg Oral Daily  . losartan  12.5 mg Oral QHS  . mexiletine  200 mg Oral TID  . oxyCODONE  15 mg Oral Q8H  . pantoprazole  40 mg Oral Daily  . potassium chloride SA  20 mEq Oral  Daily  . sodium chloride flush  3 mL Intravenous Q12H     Infusions: . sodium chloride    . amiodarone 30 mg/hr (07/06/18 0459)     PRN Medications:  sodium chloride, acetaminophen, albuterol, ondansetron (ZOFRAN) IV, sodium chloride flush   Assessment:   [name] is a 52 y.o. female with CAD s/p PCI to RCA 2011, mixed ICM/NICM NYHA II, ACC B (EF 20%, s/p ICD) c/b NSVT, SLE, GERD, and ongoing tobacco use.   Presented for planned admission due to NSVT/VT to re-challenge on antiarrythmic therapy with previous reactions to same.    Plan/Discussion:     1. NSVT/VT - Managed by Dr. Caryl Comes. S/p MDT ICD.  - Started back on mexiletine 200 mg TID 06/30/18.  - Tolerating IV amio well with mild nausea. No signs of allergy yet but it is early. Continue to monitor.   - s/p ICD  shock 12/2017 in setting of electrolytes. Can drive again this month.  - Continues to have NSVT/PVCs, but burden seems to be improving. No sustained VT.Suspect contributing to cardiomyopathy.  - ZIO patch 8-04/2018 with predominantly sinus rhythm with > 7900 runs of NSVT/VT, longest 30 seconds. - Continue coreg 3.125 mg BIDas tolerated.  2. NSTEMI in CAD - s/p remote stent. Last cath 2004. Peak trop 30.3 - Cath 06/24/18 with in-stent stenosis of mRCA stent and significant LM disease.  - Discussed with TCTS and interventional team. Not candidate for CABG or PCI at this time.  - If EF improves with PVC suppression, may be able to re-consider CABG, but if not, will need to continue VAD consideration.  - No s/s ischemia  - Continue ASA and statin.  3. Chronic systolic CHF - Echo 2/35/57 LVEF 15-20%, Grade 2 DD, Mod MR, Severe LAE, Mod RV reduction - Volume status looks ok  Continue lasix 20 mg daily.  - Continue Coreg 3.125 mg BID.  - Continue losartan 12.5 mg qhs with hypotension.  - Continue digoxin 0.125 mg daily. Level ok 06/22/18  - Consider for VAD, unless we can achieve PVC suppression and EF recovery with antiarrhythmic therapy. If not, will need to proceed with LVAD. If recovers, may be candidate for CABG.  - Liver lesion likely benign. AFP negative.  4. Tobacco abuse - Encouraged complete cessation.  - PFTs 06/29/18 FVC 1.73 (56%), FEV1 1.49 (61%)  5. GERD - Continue protonix and pepcid   Length of Stay: 1   Glori Bickers MD 07/06/2018, 7:27 AM  Advanced Heart Failure Team Pager [phone] (M-F; [place])  Please contact [facility] for night-coverage after hours (4p -7a ) and weekends on [email or web address]

## 2018-07-07 ENCOUNTER — Encounter (HOSPITAL_COMMUNITY): Payer: Self-pay | Admitting: Critical Care Medicine

## 2018-07-07 LAB — BASIC METABOLIC PANEL
ANION GAP: 8 (ref 5–15)
BUN: 12 mg/dL (ref 6–20)
CALCIUM: 9 mg/dL (ref 8.9–10.3)
CO2: 25 mmol/L (ref 22–32)
CREATININE: 0.96 mg/dL (ref 0.44–1.00)
Chloride: 104 mmol/L (ref 98–111)
Glucose, Bld: 139 mg/dL — ABNORMAL HIGH (ref 70–99)
Potassium: 3.9 mmol/L (ref 3.5–5.1)
SODIUM: 137 mmol/L (ref 135–145)

## 2018-07-07 MED ORDER — AMIODARONE HCL 200 MG PO TABS
400.0000 mg | ORAL_TABLET | Freq: Two times a day (BID) | ORAL | Status: DC
  Administered 2018-07-07 – 2018-07-09 (×5): 400 mg via ORAL
  Filled 2018-07-07 (×5): qty 2

## 2018-07-07 NOTE — Progress Notes (Signed)
Pt tolerating amiodarone without any allergic reaction so far.  Pt states that the amiodarone she took when she had severe allergic reaction (swelling in face and arms) was a white tablet not the pink tablet.  Idolina Primer, RN

## 2018-07-07 NOTE — Consult Note (Addendum)
   [facility] CM Inpatient Consult   07/07/2018  [name] [birth year] [id number]   Patient screened for potential [facility] Care Management program services due to hospital readmission.  Went to bedside to speak with [name] about [place] Management program services. She was agreeable to [facility] follow up and [place] Management written consent obtained. [facility] folder provided.  [name] reports she lives with her sister. Denies having any concerns with obtaining medications. Reports her daughter takes her MD appointments. Confirmed best contact number for [name] as [phone]. Confirmed Primary Care MD is Dr. Moshe Cipro  [facility] is listed as doing transition of care call).  Discussed [facility] Care Management follow up will be for CHF disease management.  Made inpatient RNCM aware [place] Management will follow post discharge.    Marthenia Rolling, MSN-Ed, RN,BSN [facility] Liaison [phone]

## 2018-07-07 NOTE — Progress Notes (Addendum)
Advanced Heart Failure Rounding Note   Subjective:    Remains on IV amio @ 30 mg/hr. Only 0-1 PVCs/min. SBP 90s.   Feels fine. Denies SOB or CP. Nausea and heartburn resolved. No facial swelling. Wants to go home. She says in the past, she had facial swelling very soon after taking amio.   Objective:   Weight Range:  Vital Signs:   Temp:  [97.4 F (36.3 C)-98.4 F (36.9 C)] 97.7 F (36.5 C) (11/20 0518) Pulse Rate:  [65-85] 65 (11/20 0518) Resp:  [16-18] 18 (11/20 0518) BP: (83-115)/(58-76) 96/58 (11/20 0518) SpO2:  [94 %-100 %] 100 % (11/20 0518) Weight:  [92.2 kg] 92.2 kg (11/20 0518) Last BM Date: 07/05/18  Weight change: Filed Weights   07/05/18 1334 07/06/18 0520 07/07/18 0518  Weight: 92.6 kg 92.8 kg 92.2 kg    Intake/Output:   Intake/Output Summary (Last 24 hours) at 07/07/2018 0726 Last data filed at 07/07/2018 0526 Gross per 24 hour  Intake 1077.23 ml  Output 2600 ml  Net -1522.77 ml     Physical Exam: General: Well appearing. No resp difficulty. HEENT: Normal Anicteric Neck: Supple. JVP 5-6. Carotids 2+ bilat; no bruits. No thyromegaly or nodule noted. Cor: PMI laterally displaced. RRR, No M/G/R noted Lungs: CTAB, normal effort. Abdomen: Soft, non-tender, non-distended, no HSM. No bruits or masses. +BS  Extremities: No cyanosis, clubbing, or rash. R and LLE no edema.  Neuro: Alert & orientedx3, cranial nerves grossly intact. moves all 4 extremities w/o difficulty. Affect pleasant  Telemetry: NSR 60-70s with rare PVCs (0-1/min). Personally reviewed.    Labs: Basic Metabolic Panel: Recent Labs  Lab 07/05/18 1430 07/06/18 0338 07/07/18 0211  NA 136 135 137  K 4.4 5.0 3.9  CL 107 106 104  CO2 23 23 25   GLUCOSE 113* 122* 139*  BUN 11 11 12   CREATININE 0.96 1.01* 0.96  CALCIUM 8.9 8.9 9.0  MG 2.0  --   --     Liver Function Tests: Recent Labs  Lab 07/05/18 1430  AST 33  ALT 21  ALKPHOS 55  BILITOT 0.6  PROT 7.8  ALBUMIN 3.7     No results for input(s): LIPASE, AMYLASE in the last 168 hours. No results for input(s): AMMONIA in the last 168 hours.  CBC: Recent Labs  Lab 07/05/18 1430  WBC 5.6  NEUTROABS 2.6  HGB 11.9*  HCT 39.7  MCV 86.5  PLT 362    Cardiac Enzymes: No results for input(s): CKTOTAL, CKMB, CKMBINDEX, TROPONINI in the last 168 hours.  BNP: BNP (last 3 results) Recent Labs    12/25/17 0017 06/22/18 1526 07/05/18 1430  BNP 113.0* 615.0* 730.3*    ProBNP (last 3 results) No results for input(s): PROBNP in the last 8760 hours.    Other results:  Imaging: No results found.   Medications:     Scheduled Medications: . ALPRAZolam  1 mg Oral QHS  . aspirin EC  81 mg Oral Daily  . atorvastatin  10 mg Oral Daily  . carvedilol  3.125 mg Oral BID WC  . cholecalciferol  1,000 Units Oral Daily  . cyclobenzaprine  5 mg Oral QHS  . digoxin  0.125 mg Oral QODAY  . enoxaparin (LOVENOX) injection  40 mg Subcutaneous Q24H  . famotidine  20 mg Oral BID  . fenofibrate  160 mg Oral Daily  . furosemide  20 mg Oral Daily  . losartan  12.5 mg Oral QHS  . mexiletine  200 mg  Oral TID  . oxyCODONE  15 mg Oral Q8H  . pantoprazole  40 mg Oral Daily  . potassium chloride SA  20 mEq Oral Daily  . sodium chloride flush  3 mL Intravenous Q12H    Infusions: . sodium chloride    . amiodarone 30 mg/hr (07/07/18 0636)    PRN Medications: sodium chloride, acetaminophen, albuterol, ondansetron (ZOFRAN) IV, sodium chloride flush   Assessment:   [name] is a 52 y.o. female with CAD s/p PCI to RCA 2011, mixed ICM/NICM NYHA II, ACC B (EF 20%, s/p ICD) c/b NSVT, SLE, GERD, and ongoing tobacco use.   Presented for planned admission due to NSVT/VT to re-challenge on antiarrythmic therapy with previous reactions to same.    Plan/Discussion:     1. NSVT/VT - Managed by Dr. Caryl Comes. S/p MDT ICD.  - Started back on mexiletine 200 mg TID 06/30/18.  - Tolerating IV amio well. No  signs of allergy yet but it is early. Continue to monitor.  Very few PVCs on tele. Nausea better. - s/p ICD shock 12/2017 in setting of electrolytes. Can drive again this month.  - Continues to have NSVT/PVCs, but burden seems to be improving. No sustained VT.Suspect contributing to cardiomyopathy.  - ZIO patch 8-04/2018 with predominantly sinus rhythm with > 7900 runs of NSVT/VT, longest 30 seconds. - Continue coreg 3.125 mg BIDas tolerated.  2. NSTEMI in CAD - s/p remote stent. Last cath 2004. Peak trop 30.3 - Cath 06/24/18 with in-stent stenosis of mRCA stent and significant LM disease.  - Discussed with TCTS and interventional team. Not candidate for CABG or PCI at this time.  - If EF improves with PVC suppression, may be able to re-consider CABG, but if not, will need to continue VAD consideration.  - No s/s ischemia.  - Continue ASA and statin.  3. Chronic systolic CHF - Echo 11/25/79 LVEF 15-20%, Grade 2 DD, Mod MR, Severe LAE, Mod RV reduction - Volume status stable.  Continue lasix 20 mg daily.  - Continue Coreg 3.125 mg BID.  - Continue losartan 12.5 mg qhs with hypotension. SBP 90s. No titration today.  - Continue digoxin 0.125 mg daily. Level ok 06/22/18  - Consider for VAD, unless we can achieve PVC suppression and EF recovery with antiarrhythmic therapy. If not, will need to proceed with LVAD. If recovers, may be candidate for CABG.  - Liver lesion likely benign. AFP negative.  4. Tobacco abuse - Encouraged complete cessation. No change.  - PFTs 06/29/18 FVC 1.73 (56%), FEV1 1.49 (61%)  5. GERD - Continue protonix and pepcid. Improved.    Length of Stay: 2   Georgiana Shore MD 07/07/2018, 7:26 AM  Advanced Heart Failure Team Pager [phone] (M-F; [place])  Please contact [facility] for night-coverage after hours (4p -7a ) and weekends on [email or web address]  Patient seen and examined with the above-signed Advanced Practice Provider and/or Housestaff. I personally  reviewed laboratory data, imaging studies and relevant notes. I independently examined the patient and formulated the important aspects of the plan. I have edited the note to reflect any of my changes or salient points. I have personally discussed the plan with the patient and/or family.  Ventricular ectopy much improved on IV amio and mexilitene. Tolerating well. Volume status looks good. Will switch to po amio today as it may have been the po drug carrier that she was allergic to so want to make sure she is stable on oral prior to  d/c. D/w Dr. Caryl Comes and PharmD. Continue current med regimen. VAD team updated.   Glori Bickers, MD  12:58 PM

## 2018-07-08 ENCOUNTER — Encounter (HOSPITAL_COMMUNITY)
Admission: RE | Disposition: A | Discharge status: Home / Self Care | Payer: Self-pay | Source: Home / Self Care | Attending: Internal Medicine

## 2018-07-08 LAB — BASIC METABOLIC PANEL
ANION GAP: 6 (ref 5–15)
BUN: 11 mg/dL (ref 6–20)
CALCIUM: 9 mg/dL (ref 8.9–10.3)
CO2: 27 mmol/L (ref 22–32)
Chloride: 104 mmol/L (ref 98–111)
Creatinine, Ser: 1.12 mg/dL — ABNORMAL HIGH (ref 0.44–1.00)
GFR calc Af Amer: >60 mL/min (ref >60)
GFR, EST NON AFRICAN AMERICAN: 55 mL/min — AB (ref >60)
GLUCOSE: 119 mg/dL — AB (ref 70–99)
Potassium: 4.4 mmol/L (ref 3.5–5.1)
Sodium: 137 mmol/L (ref 135–145)

## 2018-07-08 SURGERY — INSERTION OF IMPLANTABLE LEFT VENTRICULAR ASSIST DEVICE
Anesthesia: General | Site: Chest

## 2018-07-08 NOTE — Progress Notes (Addendum)
Advanced Heart Failure Rounding Note   Subjective:    Switched to PO amio yesterday. She has received 2 doses so far with no s/s allergy.  She is having some runs of VT. Ectopy much decreased. Denies SOB, orthopnea or PND   Objective:   Weight Range:  Vital Signs:   Temp:  [97.6 F (36.4 C)-98.6 F (37 C)] 98 F (36.7 C) (11/21 0546) Pulse Rate:  [63-88] 64 (11/21 0546) Resp:  [17-18] 17 (11/21 0106) BP: (89-102)/(57-74) 89/62 (11/21 0546) SpO2:  [90 %-100 %] 96 % (11/21 0106) Weight:  [91.5 kg] 91.5 kg (11/21 0549) Last BM Date: 07/05/18  Weight change: Filed Weights   07/06/18 0520 07/07/18 0518 07/08/18 0549  Weight: 92.8 kg 92.2 kg 91.5 kg    Intake/Output:   Intake/Output Summary (Last 24 hours) at 07/08/2018 0726 Last data filed at 07/08/2018 0552 Gross per 24 hour  Intake 240 ml  Output 2200 ml  Net -1960 ml     Physical Exam: General: Well appearing. No resp difficulty. HEENT: Normal Neck: Supple. JVP 5-6. Carotids 2+ bilat; no bruits. No thyromegaly or nodule noted. Cor: PMI laterally displaced. RRR, occasional ectopy No M/G/R noted Lungs: CTAB, normal effort. Abdomen: Soft, non-tender, non-distended, no HSM. No bruits or masses. +BS  Extremities: No cyanosis, clubbing, or rash. R and LLE no edema.  Neuro: Alert & orientedx3, cranial nerves grossly intact. moves all 4 extremities w/o difficulty. Affect pleasant  Telemetry: NSR 60s, occasional NVST Personally reviewed.    Labs: Basic Metabolic Panel: Recent Labs  Lab 07/05/18 1430 07/06/18 0338 07/07/18 0211 07/08/18 0442  NA 136 135 137 137  K 4.4 5.0 3.9 4.4  CL 107 106 104 104  CO2 23 23 25 27   GLUCOSE 113* 122* 139* 119*  BUN 11 11 12 11   CREATININE 0.96 1.01* 0.96 1.12*  CALCIUM 8.9 8.9 9.0 9.0  MG 2.0  --   --   --     Liver Function Tests: Recent Labs  Lab 07/05/18 1430  AST 33  ALT 21  ALKPHOS 55  BILITOT 0.6  PROT 7.8  ALBUMIN 3.7   No results for input(s):  LIPASE, AMYLASE in the last 168 hours. No results for input(s): AMMONIA in the last 168 hours.  CBC: Recent Labs  Lab 07/05/18 1430  WBC 5.6  NEUTROABS 2.6  HGB 11.9*  HCT 39.7  MCV 86.5  PLT 362    Cardiac Enzymes: No results for input(s): CKTOTAL, CKMB, CKMBINDEX, TROPONINI in the last 168 hours.  BNP: BNP (last 3 results) Recent Labs    12/25/17 0017 06/22/18 1526 07/05/18 1430  BNP 113.0* 615.0* 730.3*    ProBNP (last 3 results) No results for input(s): PROBNP in the last 8760 hours.    Other results:  Imaging: No results found.   Medications:     Scheduled Medications: . ALPRAZolam  1 mg Oral QHS  . amiodarone  400 mg Oral BID  . aspirin EC  81 mg Oral Daily  . atorvastatin  10 mg Oral Daily  . carvedilol  3.125 mg Oral BID WC  . cholecalciferol  1,000 Units Oral Daily  . cyclobenzaprine  5 mg Oral QHS  . digoxin  0.125 mg Oral QODAY  . enoxaparin (LOVENOX) injection  40 mg Subcutaneous Q24H  . famotidine  20 mg Oral BID  . fenofibrate  160 mg Oral Daily  . furosemide  20 mg Oral Daily  . losartan  12.5 mg Oral QHS  .  mexiletine  200 mg Oral TID  . oxyCODONE  15 mg Oral Q8H  . pantoprazole  40 mg Oral Daily  . potassium chloride SA  20 mEq Oral Daily  . sodium chloride flush  3 mL Intravenous Q12H    Infusions: . sodium chloride      PRN Medications: sodium chloride, acetaminophen, albuterol, ondansetron (ZOFRAN) IV, sodium chloride flush   Assessment:   [name] is a 52 y.o. female with CAD s/p PCI to RCA 2011, mixed ICM/NICM NYHA II, ACC B (EF 20%, s/p ICD) c/b NSVT, SLE, GERD, and ongoing tobacco use.   Presented for planned admission due to NSVT/VT to re-challenge on antiarrythmic therapy with previous reactions to same.    Plan/Discussion:     1. NSVT/VT - Managed by Dr. Caryl Comes. S/p MDT ICD.  - Started back on mexiletine 200 mg TID 06/30/18.  - Now on PO amio. No s/s allergy. Plan to monitor for allergy until  tomorrow.  - Now rhythm flipping from NSR to ?junctional tachycardia 90s with wide QRS. Check EKG.  - s/p ICD shock 12/2017 in setting of electrolytes. Can drive again this month.  - ZIO patch 8-04/2018 with predominantly sinus rhythm with > 7900 runs of NSVT/VT, longest 30 seconds. - Continue coreg 3.125 mg BIDas tolerated.  2. NSTEMI in CAD - s/p remote stent. Last cath 2004. Peak trop 30.3 - Cath 06/24/18 with in-stent stenosis of mRCA stent and significant LM disease.  - Discussed with TCTS and interventional team. Not candidate for CABG or PCI at this time.  - If EF improves with PVC suppression, may be able to re-consider CABG, but if not, will need to continue VAD consideration.  - No s/s ischemia.  - Continue ASA and statin.  3. Chronic systolic CHF - Echo 1/61/09 LVEF 15-20%, Grade 2 DD, Mod MR, Severe LAE, Mod RV reduction - Volume status stable. Continue lasix 20 mg daily.  - Continue Coreg 3.125 mg BID.  - Continue losartan 12.5 mg qhs with hypotension. SBP 90-100. No titration today.  - Continue digoxin 0.125 mg daily. Level ok 06/22/18  - Consider for VAD, unless we can achieve PVC suppression and EF recovery with antiarrhythmic therapy. If not, will need to proceed with LVAD. If recovers, may be candidate for CABG.  - Liver lesion likely benign. AFP negative.  4. Tobacco abuse - Encouraged complete cessation. No change.  - PFTs 06/29/18 FVC 1.73 (56%), FEV1 1.49 (61%)  5. GERD - Continue protonix and pepcid. Resolved.    Length of Stay: 3   Georgiana Shore MD 07/08/2018, 7:26 AM  Advanced Heart Failure Team Pager [phone] (M-F; [place])  Please contact [facility] for night-coverage after hours (4p -7a ) and weekends on [email or web address]  Patient seen and examined with the above-signed Advanced Practice Provider and/or Housestaff. I personally reviewed laboratory data, imaging studies and relevant notes. I independently examined the patient and formulated the  important aspects of the plan. I have edited the note to reflect any of my changes or salient points. I have personally discussed the plan with the patient and/or family.  Tolerating mexilitene and amio well. Ectopy much decreased but still with runs of NSVT. No evidence of amio allergy. Will get oral amio from HF fund to make sure she gets same pills she got in the hospital. Volume status ok. No angina.   Glori Bickers, MD  4:41 PM

## 2018-07-08 NOTE — Progress Notes (Signed)
Patiently currently hospitalized. ICM Remote Transmission rescheduled for 07/26/2018.

## 2018-07-08 NOTE — Progress Notes (Signed)
Patient has hosp follow-up appt in the AHF Clinic on 12/5 at 10 AM.

## 2018-07-08 NOTE — Care Management Important Message (Signed)
Important Message  Patient Details  Name: [name] MRN: [id number] Date of Birth: 11/03/1965   Medicare Important Message Given:  Yes    Orbie Pyo 07/08/2018, 3:34 PM

## 2018-07-08 NOTE — Care Management Note (Addendum)
Case Management Note  Patient Details  Name: Madgeline Rayo MRN: [id number] Date of Birth: March 04, 1966  Subjective/Objective:   Pt presented for NSVT- planned admission for IV Amio re-challenge. Now on po medications. PTA from home and has support of daughter. Patient's daughter at the bedside and stated she wants to be her [facility] provider via [facility]. Patient to be followed up outpatient via the [facility].                   Action/Plan: CM did discuss with patient about [place], however patients daughter states she will be taking care of her mother once she transitions home. CM will continue to monitor for additional disposition needs.   Expected Discharge Date:                  Expected Discharge Plan:  Home/Self Care  In-House Referral:  NA  Discharge planning Services  CM Consult  Post Acute Care Choice:  NA Choice offered to:  NA  DME Arranged:  N/A DME Agency:  NA  HH Arranged:  NA HH Agency:  NA  Status of Service:  Completed, signed off  If discussed at [address] of Stay Meetings, dates discussed:    Additional Comments: 1550 07-08-18 Jacqlyn Krauss, RN, BSN [phone] [facility] to follow up with patient in the community as well.  Bethena Roys, RN 07/08/2018, 3:42 PM

## 2018-07-09 LAB — BASIC METABOLIC PANEL
Anion gap: 7 (ref 5–15)
BUN: 13 mg/dL (ref 6–20)
CHLORIDE: 103 mmol/L (ref 98–111)
CO2: 26 mmol/L (ref 22–32)
CREATININE: 1.19 mg/dL — AB (ref 0.44–1.00)
Calcium: 9.1 mg/dL (ref 8.9–10.3)
GFR calc Af Amer: 59 mL/min — ABNORMAL LOW (ref >60)
GFR calc non Af Amer: 51 mL/min — ABNORMAL LOW (ref >60)
Glucose, Bld: 102 mg/dL — ABNORMAL HIGH (ref 70–99)
Potassium: 4.4 mmol/L (ref 3.5–5.1)
SODIUM: 136 mmol/L (ref 135–145)

## 2018-07-09 MED ORDER — AMIODARONE HCL 400 MG PO TABS: 400.0000 mg | ORAL_TABLET | Freq: Two times a day (BID) | ORAL | 3 refills | Status: DC

## 2018-07-09 MED ORDER — OXYCODONE HCL 5 MG PO TABS: 15.0000 mg | ORAL_TABLET | Freq: Three times a day (TID) | ORAL | Status: DC | PRN

## 2018-07-09 MED ORDER — FAMOTIDINE 20 MG PO TABS: 20.0000 mg | ORAL_TABLET | Freq: Two times a day (BID) | ORAL | 0 refills | Status: AC

## 2018-07-09 MED FILL — FAMOTIDINE 20 MG TABLET: 20 | 5 days supply | Qty: 10 | Fill #0 | Status: TO

## 2018-07-09 MED FILL — PACERONE 200 MG TABS: 200 | 45 days supply | Qty: 180 | Fill #0 | Status: TO

## 2018-07-09 NOTE — Discharge Summary (Addendum)
Advanced Heart Failure Discharge Note  Discharge Summary   Patient ID: [name] MRN: [id number], DOB/AGE: [birth year] 52 y.o. Admit date: 07/05/2018 D/C date:     07/09/2018   Primary Discharge Diagnoses:  1. NSVT/VT - On amio 400 mg BID + mexilitine 200 mg TID 2. NSTEMI with CAD - Possible CABG candidate if PVC suppression improves EF 3. Chronic systolic HF - Had MDT ICD - Possible VAD candidate if EF remains low despite PVC suppression 4. Tobacco abuse 5. GERD  Hospital Course:  [name] is a 52 y.o. female with CAD s/p PCI to RCA 2011, NSTEMI 06/2018 with 3v CAD, mixed ICM/NICM (EF 20%, s/p ICD), NSVT, SLE, GERD, and ongoing tobacco use.   Recently admitted and had CP leading to [facility], which showed in-stent stenosis of mRCA stent and significant LM disease. Not a candidate for CABG or PCI. LVAD work up began. She had a RHC as part of work up and noted to have very frequent PVCs. Dr Haroldine Laws discussed with Dr Caryl Comes, who recommended re-trial of antiarrhymic medications. She was started on mexilitine and discharged with planned readmission to trial amiodarone (has had facial swelling with amio in the past).   She was readmitted 11/18 and started on IV amiodarone. She then transitioned to PO with no s/s allergy. Tablet noted to be a different color than previous tablet that she reacted to. PVCs were suppressed with only 0-1 PVC/min on telemetry by day of discharge. She was provided a 100-month supply of amiodarone through [facility] to make sure she gets the same tablet. Will need to make sure she only receives brand name in the future.  She was started on pepcid in addition to protonix for GERD associated with mexilitine. Symptoms improved with medications.  Our plan going forward is to see if EF improves with PVC suppression. If it does, we will have her reevaluated for CABG. If EF does not improve, will plan to continue VAD work up. Pt is aware of this plan.  VAD team has also been updated.  She will be followed closely in HF clinic, with appointment as below.   Discharge Weight Range: 204 lbs Discharge Vitals: Blood pressure (!) 89/66, pulse 67, temperature 98 F (36.7 C), temperature source Oral, resp. rate 19, height 5\' 6"  (1.676 m), weight 92.6 kg, SpO2 100 %.  Labs: Lab Results  Component Value Date   WBC 5.6 07/05/2018   HGB 11.9 (L) 07/05/2018   HCT 39.7 07/05/2018   MCV 86.5 07/05/2018   PLT 362 07/05/2018    Recent Labs  Lab 07/05/18 1430  07/09/18 0422  NA 136   < > 136  K 4.4   < > 4.4  CL 107   < > 103  CO2 23   < > 26  BUN 11   < > 13  CREATININE 0.96   < > 1.19*  CALCIUM 8.9   < > 9.1  PROT 7.8  --   --   BILITOT 0.6  --   --   ALKPHOS 55  --   --   ALT 21  --   --   AST 33  --   --   GLUCOSE 113*   < > 102*   < > = values in this interval not displayed.   Lab Results  Component Value Date   CHOL 92 06/28/2018   HDL 23 (L) 06/28/2018   LDLCALC 49 06/28/2018   TRIG 102 06/28/2018  BNP (last 3 results) Recent Labs    12/25/17 0017 06/22/18 1526 07/05/18 1430  BNP 113.0* 615.0* 730.3*    ProBNP (last 3 results) No results for input(s): PROBNP in the last 8760 hours.   Diagnostic Studies/Procedures   RHC 06/29/18 RA = 5 RV = 34/5 PA = 40/11 (21) PCW = 9 Fick cardiac output/index = 4.3/2.1 PVR = 2.8 WU Ao sat = 93% PA sat = 48%, 56%  LHC 06/24/18  Dist LM lesion is 65% stenosed.  Prox RCA to Mid RCA lesion is 100% stenosed.  Ost Ramus lesion is 60% stenosed.  Non-stenotic Ramus lesion.  Prox LAD lesion is 70% stenosed.  Findings: Ao = 79/58 (67) LV = 82/12 1. LM: Distal 60-70% lesion 2. LAD: 70% mid 3. Ramus: 60% prox 4. RCA 100% mid in stent with L -> R colalterals EF 15% with severe global HK  Discharge Medications   Allergies as of 07/09/2018      Reactions   Amiodarone Swelling, Other (See Comments)   11/22 - tolerating pacerone this admit Headache and facial  swelling; re-trial inpatient 07/07/2018   Bee Venom Anaphylaxis   Cortisone Swelling   Mexiletine Other (See Comments)   (Caused bad heartburn) Dr. Haroldine Laws and Caryl Comes aware and are giving new trial on 06/30/18   Plaquenil [hydroxychloroquine] Other (See Comments)   Caused damage to the patient's heart (was being taken to treat Lupus)   Robaxin [methocarbamol] Hives, Itching   Tape Rash, Other (See Comments)   "EATS ME UP"      Medication List    TAKE these medications   albuterol 108 (90 Base) MCG/ACT inhaler Commonly known as:  PROVENTIL HFA;VENTOLIN HFA Inhale 2 puffs into the lungs every 6 (six) hours as needed for wheezing or shortness of breath.   ALPRAZolam 1 MG tablet Commonly known as:  XANAX Take 1 tablet (1 mg total) by mouth at bedtime. What changed:    when to take this  reasons to take this   amiodarone 400 MG tablet Commonly known as:  PACERONE Take 1 tablet (400 mg total) by mouth 2 (two) times daily.   aspirin 81 MG tablet Take 1 tablet (81 mg total) by mouth daily.   atorvastatin 10 MG tablet Commonly known as:  LIPITOR Take 1 tablet (10 mg total) by mouth daily.   carvedilol 3.125 MG tablet Commonly known as:  COREG Take 1 tablet (3.125 mg total) by mouth 2 (two) times daily with a meal.   cholecalciferol 1000 units tablet Commonly known as:  VITAMIN D Take 1,000 Units by mouth daily.   cyclobenzaprine 5 MG tablet Commonly known as:  FLEXERIL Take 1 tablet (5 mg total) by mouth at bedtime. What changed:    when to take this  reasons to take this   digoxin 0.125 MG tablet Commonly known as:  LANOXIN Take 1 tablet (0.125 mg total) by mouth every other day.   famotidine 20 MG tablet Commonly known as:  PEPCID Take 1 tablet (20 mg total) by mouth 2 (two) times daily.   fenofibrate 145 MG tablet Commonly known as:  TRICOR Take 1 tablet (145 mg total) by mouth daily.   furosemide 20 MG tablet Commonly known as:  LASIX Take 20 mg by  mouth daily.   losartan 25 MG tablet Commonly known as:  COZAAR Take 0.5 tablets (12.5 mg total) by mouth at bedtime. What changed:  how much to take   mexiletine 200 MG capsule Commonly known as:  MEXITIL Take 1 capsule (200 mg total) by mouth 3 (three) times daily.   oxyCODONE 15 MG immediate release tablet Commonly known as:  ROXICODONE Take 15 mg by mouth every 8 (eight) hours.   pantoprazole 40 MG tablet Commonly known as:  PROTONIX Take 1 tablet (40 mg total) by mouth daily.   potassium chloride SA 20 MEQ tablet Commonly known as:  K-DUR,KLOR-CON Take 1 tablet (20 mEq total) by mouth daily.       Disposition   The patient will be discharged in stable condition to home. Discharge Instructions    (HEART FAILURE PATIENTS) Call MD:  Anytime you have any of the following symptoms: 1) 3 pound weight gain in 24 hours or 5 pounds in 1 week 2) shortness of breath, with or without a dry hacking cough 3) swelling in the hands, feet or stomach 4) if you have to sleep on extra pillows at night in order to breathe.   Complete by:  As directed    AMB Referral to [facility]   Complete by:  As directed    Please assign to [facility] for complex case management and CHF. Written consent obtained. Recent hospital readmission. PCP office ([facility]) listed as doing toc. Please assess for referral to HF program with [facility]. Currently at [facility]. Please call with questions. Thanks. Marthenia Rolling, [place], RN,BSN-THN [facility] [phone]   Reason for consult:  Please assign to [place]   Diagnoses of:  Heart Failure   Expected date of contact:  1-3 days (reserved for hospital discharges)   Call MD for:  difficulty breathing, headache or visual disturbances   Complete by:  As directed    Call MD for:  hives   Complete by:  As directed    Call MD for:  persistant dizziness or light-headedness   Complete by:  As directed    Diet -  low sodium heart healthy   Complete by:  As directed    Heart Failure patients record your daily weight using the same scale at the same time of day   Complete by:  As directed    Increase activity slowly   Complete by:  As directed    STOP any activity that causes chest pain, shortness of breath, dizziness, sweating, or exessive weakness   Complete by:  As directed      Follow-up Information    [place]. Go on 07/22/2018.   Specialty:  Cardiology Why:  at 10 AM in the Advanced Heart Failure Clinie.  Please bring all medications to appt.  Gate code is 1900 for Dec. Contact information: [address] [id number] [place] [phone]            Duration of Discharge Encounter: Greater than 35 minutes   Signed, Georgiana Shore, NP 07/09/2018, 12:50 PM    Patient seen and examined with the above-signed Advanced Practice Provider and/or Housestaff. I personally reviewed laboratory data, imaging studies and relevant notes. I independently examined the patient and formulated the important aspects of the plan. I have edited the note to reflect any of my changes or salient points. I have personally discussed the plan with the patient and/or family.  Doing well ectopy markedly suppressed on amio and mexilitene. Tolerating therapy well. Can d/o home today and follow closely for LV recovery. If LV recovers will need CABG. If not, proceed with VAD.   Quillian Quince  Yajayra Feldt, MD  10:12 AM

## 2018-07-09 NOTE — Progress Notes (Addendum)
Advanced Heart Failure Rounding Note   Subjective:    Remains on PO amio without s/s allergy. No more runs of NSVT. Very few PVCs.   Denies CP, SOB, dizziness. Wants to go home. Says she should be able to pay for 3 month supply of amio as long as it is not too expensive. Agreeable to TOC.   Objective:   Weight Range:  Vital Signs:   Temp:  [97.9 F (36.6 C)-98 F (36.7 C)] 98 F (36.7 C) (11/22 0512) Pulse Rate:  [63-75] 63 (11/22 0512) Resp:  [19] 19 (11/21 1600) BP: (94-113)/(66-71) 96/66 (11/22 0512) SpO2:  [99 %-100 %] 100 % (11/22 0512) Weight:  [92.6 kg] 92.6 kg (11/22 0512) Last BM Date: 07/08/18  Weight change: Filed Weights   07/07/18 0518 07/08/18 0549 07/09/18 0512  Weight: 92.2 kg 91.5 kg 92.6 kg    Intake/Output:   Intake/Output Summary (Last 24 hours) at 07/09/2018 0736 Last data filed at 07/08/2018 1600 Gross per 24 hour  Intake 400 ml  Output 400 ml  Net 0 ml     Physical Exam: General: Well appearing. No resp difficulty. HEENT: Normal Neck: Supple. JVP 5-6. Carotids 2+ bilat; no bruits. No thyromegaly or nodule noted. Cor: PMI laterally displaced. RRR, No M/G/R noted Lungs: CTAB, normal effort. Abdomen: Soft, non-tender, non-distended, no HSM. No bruits or masses. +BS  Extremities: No cyanosis, clubbing, or rash. R and LLE no edema.  Neuro: Alert & orientedx3, cranial nerves grossly intact. moves all 4 extremities w/o difficulty. Affect pleasant  Telemetry: NSR 60s. 0-1 PVC's/min. Personally reviewed.   Labs: Basic Metabolic Panel: Recent Labs  Lab 07/05/18 1430 07/06/18 0338 07/07/18 0211 07/08/18 0442 07/09/18 0422  NA 136 135 137 137 136  K 4.4 5.0 3.9 4.4 4.4  CL 107 106 104 104 103  CO2 23 23 25 27 26   GLUCOSE 113* 122* 139* 119* 102*  BUN 11 11 12 11 13   CREATININE 0.96 1.01* 0.96 1.12* 1.19*  CALCIUM 8.9 8.9 9.0 9.0 9.1  MG 2.0  --   --   --   --     Liver Function Tests: Recent Labs  Lab 07/05/18 1430  AST 33   ALT 21  ALKPHOS 55  BILITOT 0.6  PROT 7.8  ALBUMIN 3.7   No results for input(s): LIPASE, AMYLASE in the last 168 hours. No results for input(s): AMMONIA in the last 168 hours.  CBC: Recent Labs  Lab 07/05/18 1430  WBC 5.6  NEUTROABS 2.6  HGB 11.9*  HCT 39.7  MCV 86.5  PLT 362    Cardiac Enzymes: No results for input(s): CKTOTAL, CKMB, CKMBINDEX, TROPONINI in the last 168 hours.  BNP: BNP (last 3 results) Recent Labs    12/25/17 0017 06/22/18 1526 07/05/18 1430  BNP 113.0* 615.0* 730.3*    ProBNP (last 3 results) No results for input(s): PROBNP in the last 8760 hours.    Other results:  Imaging: No results found.   Medications:     Scheduled Medications: . ALPRAZolam  1 mg Oral QHS  . amiodarone  400 mg Oral BID  . aspirin EC  81 mg Oral Daily  . atorvastatin  10 mg Oral Daily  . carvedilol  3.125 mg Oral BID WC  . cholecalciferol  1,000 Units Oral Daily  . cyclobenzaprine  5 mg Oral QHS  . digoxin  0.125 mg Oral QODAY  . enoxaparin (LOVENOX) injection  40 mg Subcutaneous Q24H  . famotidine  20  mg Oral BID  . fenofibrate  160 mg Oral Daily  . furosemide  20 mg Oral Daily  . losartan  12.5 mg Oral QHS  . mexiletine  200 mg Oral TID  . oxyCODONE  15 mg Oral Q8H  . pantoprazole  40 mg Oral Daily  . potassium chloride SA  20 mEq Oral Daily  . sodium chloride flush  3 mL Intravenous Q12H    Infusions: . sodium chloride      PRN Medications: sodium chloride, acetaminophen, albuterol, ondansetron (ZOFRAN) IV, sodium chloride flush   Assessment:   [name] is a 52 y.o. female with CAD s/p PCI to RCA 2011, mixed ICM/NICM NYHA II, ACC B (EF 20%, s/p ICD) c/b NSVT, SLE, GERD, and ongoing tobacco use.   Presented for planned admission due to NSVT/VT to re-challenge on antiarrythmic therapy with previous reactions to same.    Plan/Discussion:     1. NSVT/VT - Managed by Dr. Caryl Comes. S/p MDT ICD.  - Started back on mexiletine  200 mg TID 06/30/18.  - Now on PO amio. No s/s allergy. Very few PVCs. No further NSVT runs. - s/p ICD shock 12/2017 in setting of electrolytes. Can drive again this month.  - ZIO patch 8-04/2018 with predominantly sinus rhythm with > 7900 runs of NSVT/VT, longest 30 seconds. - Continue coreg 3.125 mg BIDas tolerated.  2. NSTEMI in CAD - s/p remote stent. Last cath 2004. Peak trop 30.3 - Cath 06/24/18 with in-stent stenosis of mRCA stent and significant LM disease.  - Discussed with TCTS and interventional team. Not candidate for CABG or PCI at this time.  - If EF improves with PVC suppression, may be able to re-consider CABG, but if not, will need to continue VAD consideration.  - No s/s ischemia - Continue ASA and statin.  3. Chronic systolic CHF - Echo 3/47/42 LVEF 15-20%, Grade 2 DD, Mod MR, Severe LAE, Mod RV reduction - Volume status stable. Continue lasix 20 mg daily.  - Continue Coreg 3.125 mg BID.  - Continue losartan 12.5 mg qhs with hypotension. SBP 90s. No titration today.  - Continue digoxin 0.125 mg daily. Level ok 06/22/18  - Consider for VAD, unless we can achieve PVC suppression and EF recovery with antiarrhythmic therapy. If not, will need to proceed with LVAD. If recovers, may be candidate for CABG.  - Liver lesion likely benign. AFP negative.  4. Tobacco abuse - Encouraged complete cessation. No change.  - PFTs 06/29/18 FVC 1.73 (56%), FEV1 1.49 (61%)  5. GERD - Continue protonix and pepcid. Resolved.   She should be able to DC today. Will get 90 day supply of PO amio from [facility] to make sure she gets same manufacturer (previous allergy was to white amio tablet, not pink). Can use HF fund if she is unable to afford.   Length of Stay: 4   Georgiana Shore MD 07/09/2018, 7:36 AM  Advanced Heart Failure Team Pager [phone] (M-F; [place])  Please contact [facility] for night-coverage after hours (4p -7a ) and weekends on [email or web address]   Patient seen and  examined with the above-signed Advanced Practice Provider and/or Housestaff. I personally reviewed laboratory data, imaging studies and relevant notes. I independently examined the patient and formulated the important aspects of the plan. I have edited the note to reflect any of my changes or salient points. I have personally discussed the plan with the patient and/or family.  Doing well ectopy markedly suppressed on  amio and mexilitene. Tolerating therapy well. Can d/o home today and follow closely for LV recovery. If LV recovers will need CABG. If not, proceed with VAD.   Glori Bickers, MD  10:12 AM

## 2018-07-12 ENCOUNTER — Telehealth: Payer: Self-pay

## 2018-07-12 ENCOUNTER — Other Ambulatory Visit: Payer: Self-pay | Admitting: *Deleted

## 2018-07-12 ENCOUNTER — Other Ambulatory Visit (HOSPITAL_COMMUNITY): Payer: Self-pay

## 2018-07-12 MED ORDER — FUROSEMIDE 20 MG PO TABS: 20.0000 mg | ORAL_TABLET | Freq: Every day | ORAL | 0 refills | Status: DC

## 2018-07-12 NOTE — Telephone Encounter (Signed)
Transition Care Management Follow-up Telephone Call   Date discharged? Home                How have you been since you were released from the hospital? Doing better    Do you understand why you were in the hospital? Yes, medication regulated    Do you understand the discharge instructions?Yes    Where were you discharged to? Home    Items Reviewed:  Medications reviewed: Yes   Allergies reviewed: Yes   Dietary changes reviewed: heart healthy   Referrals reviewed: Yes    Functional Questionnaire:   Activities of Daily Living (ADLs):  Able to perform    Any transportation issues/concerns?: None    Any patient concerns? None    Confirmed importance and date/time of follow-up visits scheduled appointment 07-19-18 @ 2:40      Confirmed with patient if condition begins to worsen call PCP or go to the ER.  Patient was given the office number and encouraged to call back with question or concerns.  :

## 2018-07-12 NOTE — Telephone Encounter (Signed)
Pt was admitted and treated./cy

## 2018-07-12 NOTE — Patient Outreach (Signed)
Referral received from hospital liason, pt hospitalized 11/18-11/22/19 for CAD, NSVT, CHF, pt possible VAD candidate if LV does not recover, if LV recovers, re-evaluate for CABG, pt smokes daily, telephone call to pt for screening, Primary MD provides transition of care, Pt reports she has all medications and taking as prescribed and is able to afford, RN CM explained [name] program, pt has signed consent, pt needs education for CHF management and will be following up next week with HF clinic.  [facility] CM Care Plan Problem One     Most Recent Value  Care Plan Problem One  Knowledge deficit related to CHF  Care Plan for Problem One  Active  [facility] Long Term Goal   Pt will show improvement in self care for CHF within 60 days  [facility] Long Term Goal Start Date  07/12/18  Interventions for Problem One Long Term Goal  RN CM reviewed discharge summary with pt, reviewed upcoming appointments, pt wrote down appointment for HF clinic 07/23/18 at 10 am, pts daughter lives with her and provides transportation  [name] CM Short Term Goal #1   pt will weigh daily and record within 30 days  [facility] CM Short Term Goal #1 Start Date  07/12/18  Interventions for Short Term Goal #1  RN CM reviewed importance of daily weights and recording, pt states she has a scale but does not weigh and states she has never been instructed to.  [facility] CM Short Term Goal #2   Pt will verbalize CHF action plan/ zones within 30 days  [facility] CM Short Term Goal #2 Start Date  07/12/18  Interventions for Short Term Goal #2  RN CM reviewed CHF action plan/ zones and importance of calling MD early for change in health status, weight gain.      PLAN See pt for initial home visit next week  Jacqlyn Larsen [name], [place] Coordinator [phone]

## 2018-07-19 ENCOUNTER — Ambulatory Visit: Payer: Medicare Other | Admitting: Family Medicine

## 2018-07-20 ENCOUNTER — Encounter: Payer: Self-pay | Admitting: Family Medicine

## 2018-07-20 ENCOUNTER — Other Ambulatory Visit: Payer: Self-pay | Admitting: *Deleted

## 2018-07-20 ENCOUNTER — Encounter: Payer: Self-pay | Admitting: *Deleted

## 2018-07-20 ENCOUNTER — Ambulatory Visit (INDEPENDENT_AMBULATORY_CARE_PROVIDER_SITE_OTHER): Payer: Medicare Other | Admitting: Family Medicine

## 2018-07-20 VITALS — BP 90/60 | HR 64 | Temp 97.1°F | Resp 15 | Ht 66.0 in | Wt 204.0 lb

## 2018-07-20 DIAGNOSIS — E118 Type 2 diabetes mellitus with unspecified complications: Secondary | ICD-10-CM | POA: Diagnosis not present

## 2018-07-20 DIAGNOSIS — Z09 Encounter for follow-up examination after completed treatment for conditions other than malignant neoplasm: Secondary | ICD-10-CM

## 2018-07-20 DIAGNOSIS — J019 Acute sinusitis, unspecified: Secondary | ICD-10-CM

## 2018-07-20 DIAGNOSIS — E785 Hyperlipidemia, unspecified: Secondary | ICD-10-CM | POA: Diagnosis not present

## 2018-07-20 DIAGNOSIS — I10 Essential (primary) hypertension: Secondary | ICD-10-CM | POA: Diagnosis not present

## 2018-07-20 MED ORDER — MICONAZOLE NITRATE 200 MG VA SUPP: 200.0000 mg | Freq: Every day | VAGINAL | 0 refills | Status: DC

## 2018-07-20 MED ORDER — PENICILLIN V POTASSIUM 500 MG PO TABS: 500.0000 mg | ORAL_TABLET | Freq: Three times a day (TID) | ORAL | 0 refills | Status: DC

## 2018-07-20 NOTE — Patient Outreach (Signed)
[facility]) Care Management   07/20/2018  [name] 07-Nov-1965 [id number]  [name] is an 52 y.o. female  Subjective: Initial home visit with pt, HIPAA verified, daughter [name] present, pt sister lives with her and daughters live nearby and see pt daily.  Pt states she does not have "a lot of goals to work on"  Pt states she has already quit smoking and will be meeting with doctor at HF clinic to discuss plans going forward citing " I'll get some type of pump for my heart or maybe surgery"  Pt states she is not familiar with HF action plan and has not been instructed to weigh daily.  Objective:   Vitals:   07/20/18 1858  BP: (!) 106/58  Pulse: 60  Resp: 18  SpO2: 96%  Weight: 204 lb (92.5 kg)  Height: 1.676 m (5\' 6" )   ROS  Physical Exam  Constitutional: She is oriented to person, place, and time. She appears well-developed and well-nourished.  HENT:  Head: Normocephalic.  Neck: Normal range of motion. Neck supple.  Cardiovascular: Normal rate and regular rhythm.  Respiratory: Effort normal and breath sounds normal.  GI: Soft. Bowel sounds are normal.  Musculoskeletal: Normal range of motion. She exhibits edema.  Dependent edema LE BL  Neurological: She is alert and oriented to person, place, and time.  Skin: Skin is dry.  Feet cold  Psychiatric: She has a normal mood and affect. Her behavior is normal. Thought content normal.    Encounter Medications:   Outpatient Encounter Medications as of 07/20/2018  Medication Sig Note  . albuterol (PROVENTIL HFA;VENTOLIN HFA) 108 (90 Base) MCG/ACT inhaler Inhale 2 puffs into the lungs every 6 (six) hours as needed for wheezing or shortness of breath.   . ALPRAZolam (XANAX) 1 MG tablet Take 1 tablet (1 mg total) by mouth at bedtime. (Patient taking differently: Take 1 mg by mouth at bedtime as needed for sleep. )   . amiodarone (PACERONE) 400 MG tablet Take 1 tablet (400 mg total) by mouth 2  (two) times daily.   Marland Kitchen aspirin 81 MG tablet Take 1 tablet (81 mg total) by mouth daily.   Marland Kitchen atorvastatin (LIPITOR) 10 MG tablet Take 1 tablet (10 mg total) by mouth daily.   . carvedilol (COREG) 3.125 MG tablet Take 1 tablet (3.125 mg total) by mouth 2 (two) times daily with a meal.   . cholecalciferol (VITAMIN D) 1000 units tablet Take 1,000 Units by mouth daily.   . cyclobenzaprine (FLEXERIL) 5 MG tablet Take 1 tablet (5 mg total) by mouth at bedtime. (Patient taking differently: Take 5 mg by mouth at bedtime as needed for muscle spasms. )   . digoxin (LANOXIN) 0.125 MG tablet Take 1 tablet (0.125 mg total) by mouth every other day.   . fenofibrate (TRICOR) 145 MG tablet Take 1 tablet (145 mg total) by mouth daily.   . furosemide (LASIX) 20 MG tablet Take 1 tablet (20 mg total) by mouth daily.   Marland Kitchen losartan (COZAAR) 25 MG tablet Take 0.5 tablets (12.5 mg total) by mouth at bedtime. (Patient taking differently: Take 25 mg by mouth at bedtime. ) 07/05/2018: Patient stated that she takes a WHOLE tablet  . mexiletine (MEXITIL) 200 MG capsule Take 1 capsule (200 mg total) by mouth 3 (three) times daily.   . pantoprazole (PROTONIX) 40 MG tablet Take 1 tablet (40 mg total) by mouth daily.   . penicillin v potassium (VEETID) 500 MG tablet  Take 1 tablet (500 mg total) by mouth 3 (three) times daily.   . famotidine (PEPCID) 20 MG tablet Take 1 tablet (20 mg total) by mouth 2 (two) times daily. (Patient not taking: Reported on 07/20/2018)   . miconazole (MICOTIN) 200 MG vaginal suppository Place 1 suppository (200 mg total) vaginally at bedtime. (Patient not taking: Reported on 07/20/2018)   . oxyCODONE (ROXICODONE) 15 MG immediate release tablet Take 15 mg by mouth every 8 (eight) hours.    . potassium chloride (K-DUR,KLOR-CON) 20 MEQ tablet Take 1 tablet (20 mEq total) by mouth daily. (Patient not taking: Reported on 07/20/2018)    No facility-administered encounter medications on file as of 07/20/2018.      Functional Status:   In your present state of health, do you have any difficulty performing the following activities: 07/20/2018 07/06/2018  Hearing? N -  Vision? N -  Difficulty concentrating or making decisions? N -  Walking or climbing stairs? N -  Dressing or bathing? N -  Doing errands, shopping? - N  Conservation officer, nature and eating ? N -  Using the Toilet? N -  In the past six months, have you accidently leaked urine? N -  Do you have problems with loss of bowel control? N -  Managing your Medications? N -  Managing your Finances? N -  Housekeeping or managing your Housekeeping? N -  Some recent data might be hidden    Fall/Depression Screening:    Fall Risk  07/20/2018 07/20/2018 07/20/2018  Falls in the past year? 0 0 0  Comment - - -  Number falls in past yr: - - -  Injury with Fall? - - -  Risk for fall due to : - Medication side effect -  Risk for fall due to: Comment - - -  Follow up - Falls prevention discussed -   PHQ 2/9 Scores 07/20/2018 07/20/2018 07/12/2018 06/22/2018 06/02/2018 03/09/2018 02/08/2018  PHQ - 2 Score 0 0 0 3 4 0 0  PHQ- 9 Score - 2 - 12 16 - -    Assessment:  RN CM reviewed medications with pt, reviewed plan of care with pt and daughter, pt states she "will try" to weigh and will look at the action plan for HF.  Rn CM emphasized importance of adhering to action plan.  RN CM faxed initial home visit and barrier letter to primary MD Dr. Moshe Cipro  [facility] CM Care Plan Problem One     Most Recent Value  Care Plan Problem One  Knowledge deficit related to CHF  Role Documenting the Problem One  Care Management [place] for Problem One  Active  [facility] Long Term Goal   Pt will show improvement in self care for CHF within 60 days  [facility]N Long Term Goal Start Date  07/12/18  Interventions for Problem One Long Term Goal  RN CM gave pt [facility] calendar, 24 hour nurse line magnet and reviewed with pt, reviewed resources to call for change in health condition  [facility]  CM Short Term Goal #1   pt will weigh daily and record within 30 days  [facility] CM Short Term Goal #1 Start Date  07/12/18  Interventions for Short Term Goal #1  RN CM ask pt to weigh daily and record in [facility] calendar and reviewed reasons for doing so related to HF  [facility] CM Short Term Goal #2   Pt will verbalize CHF action plan/ zones within 30 days  [facility] CM Short  Term Goal #2 Start Date  07/12/18  Interventions for Short Term Goal #2  RN CM gave pt copy of CHF action plan/ zones and reviewed with pt.      PLAN Call pt next week to follow up after pt sees MD at [facility] on 07/22/18 Assess if any changes made Ask if pt weighing daily and recording  Jacqlyn Larsen [facility] [place], [place] Coordinator [phone]    Plan:

## 2018-07-20 NOTE — Patient Instructions (Addendum)
F/u in 4 months, call if you need me before  Fasting lipid, cmp and EGFr and HBA1C I  1 week before next visit 1 week of penicillin and yeast med are sent for sinus infection  Plan to get flu vaccine next week Friday am  Thankful much better  NEVER EVER will you smoke again , I believe  Thank you  for choosing  Primary Care. We consider it a privelige to serve you.  Delivering excellent health care in a caring and  compassionate way is our goal.  Partnering with you,  so that together we can achieve this goal is our strategy.

## 2018-07-21 NOTE — Progress Notes (Signed)
Advanced Heart Failure Clinic Note   Referring Physician: Dr, Caryl Comes Primary Care: Tula Nakayama, MD Primary Cardiologist: Dr. Caryl Comes  HPI: [name] is a 52 y.o. female with h/o CAD s/p RCA stenting in 5885, severe systolic HF due to mixed cardiomyopathy EF , recurrent VT s/p MDT ICD, SLE, GERD and ongoing tobacco use.    EF 8/18: EF 20-25%. RV normal. Dilated LV with moderate MR   Last cath 2004 EF 29% Left main has a distal 30% stenosis. Left anterior descending artery has an ostial 30% stenosis.    Left circumflex gives rise to a very large branching ramus intermedius, two  small marginal branches, and two small posterolateral branches.  The ramus  intermedius gives off two branches.  The more medial branch has a 95%  stenosis at its ostium.  However, this is a small caliber branch being less  than or equal to 1.5 mm in diameter.  The circumflex otherwise has minor  luminal irregularities, but no significant stenoses.  Right coronary artery has a 20% stenosis in the mid vessel.  Just distal to  this there is a stent in the distal portion of the mid vessel which is  widely patent with 0% stenosis within the stent.    CT scan chest: 11/18. No PE. Mild ground glass opacitites. No COPD mentioned  She was admitted 5/9-5/05/2018 for syncope secondary to VT/VF. She was shocked by her ICD. Cardiology was consulted. Her potassium was 2.5 and magnesium was 1.6, so supplements were increased. Volume was stable.  She was admitted 5/17-5/18 with hyperkalemia >7.5 and AKI with creatinine 2.59. Potassium was corrected with kayexelate, albuterol, insulin/D50, and calcium gluconate. At discharge, potassium supplement, lasix, spiro, and ARB were stopped. K 4.2 and creatinine 1.55 on day of discharge.   Admitted to [facility] 07/05/2018 for re trial of IV amiodarone. She tolerate amiodarone without s/s. She was transitioned to amiodarone 400 mg twice a day. She is intolerant generic  amiodarone due to the dye. She was provided with a 3 month supply of amiodarone. Plan to reassess ECHO to see if PVC suppression improves EF.   Admitted 11/18 through 07/09/2018. Admitted for amiodarone challenge and started on IV amiodarone. She then transitioned to PO with no s/s allergy. Tablet noted to be a different color than previous tablet that she reacted to. PVCs were suppressed with only 0-1 PVC/min on telemetry by day of discharge. She was provided a 32-month supply of amiodarone through TOC .   Today she returns for post hospital follow up. Overall feeling better. SOB with steps. Able to walk around the grocery store.  Denies PND/Orthopnea.  Denies palpitations. Appetite ok. She has been eating a high sodium diet. No fever or chills. Weight at home has been 211-215 pounds. Taking all medications. She has not been smoking.   Medtronic: Optivol well above threshold. No VT. No A fib. Activity ~1 hour per day.  [place] 06/29/2018    RA = 5 RV = 34/5 PA = 40/11 (21) PCW = 9 Fick cardiac output/index = 4.3/2.1 PVR = 2.8 WU Ao sat = 93% PA sat = 48%, 56%   LHC 06/24/18  Dist LM lesion is 65% stenosed.  Prox RCA to Mid RCA lesion is 100% stenosed.  Ost Ramus lesion is 60% stenosed.  Non-stenotic Ramus lesion.  Prox LAD lesion is 70% stenosed RHC  Myoview 06/30/17: EF 13% There is a large defect of severe severity present in the basal anteroseptal, basal inferoseptal, basal inferior,  mid anteroseptal, mid inferoseptal, mid inferior and apical inferior location.  CPX 08/04/17 Pre-Exercise PFTs  FVC 2.21 (71%)    FEV1 1.78 (72%)     FEV1/FVC 81 (100%)     MVV 69 (69%)  Exercise Time:  6:15  Speed (mph): 2.0    Grade (%): 7.0   RPE: 16 Reason stopped: patient ended test due to being lightheaded (9/10) Additional symptoms: dyspnea (7/10)  Resting HR: 79 Peak HR: 94  (54% age predicted max HR) BP rest: 86/62 BP peak: 114/70 Peak VO2: 10.0 (50% predicted peak  VO2) VE/VCO2 slope: 46 OUES: 1.04 Peak RER: 0.91 Ventilatory Threshold: 9.4 (47% predicted or measured peak VO2) Peak RR 44 Peak Ventilation: 38.3 VE/MVV: 56% PETCO2 at peak: 26 O2pulse: 10  (91% predicted O2pulse)  Review of systems complete and found to be negative unless listed in HPI.   Past Medical History:  Diagnosis Date  . Arthritis of knee   . Automatic implantable cardiac defibrillator in situ    a. s/p prior Medtronic ICD with 6949 lead. b. s/p Gen change & lead revision 05/2013.  . Cardiomyopathy secondary    Patient has cardiomyopathy out of proportion to her ischemic heart disease  . Chronic pain   . coronary artery disease    RCA stenting Myoview 2011 EF 30% infarction dilatation without ischemia  . Coronary artery disease   . Depression   . Diabetes mellitus   . GERD (gastroesophageal reflux disease)   . Insomnia   . Lupus (systemic lupus erythematosus) ([place])   . Membranous glomerulonephritis   . Migraines   . Myocardial infarction ([place])    8 total   . Nicotine abuse   . Other and unspecified hyperlipidemia   . Paroxysmal VT ([place])   . Systolic CHF ([place])   . VF (ventricular fibrillation) ([place])    a. Hx appropriate ICD therapy for VF.   Current Outpatient Medications  Medication Sig Dispense Refill  . albuterol (PROVENTIL HFA;VENTOLIN HFA) 108 (90 Base) MCG/ACT inhaler Inhale 2 puffs into the lungs every 6 (six) hours as needed for wheezing or shortness of breath. 1 Inhaler 0  . ALPRAZolam (XANAX) 1 MG tablet Take 1 tablet (1 mg total) by mouth at bedtime. (Patient taking differently: Take 1 mg by mouth at bedtime as needed for sleep. ) 30 tablet 4  . amiodarone (PACERONE) 400 MG tablet Take 1 tablet (400 mg total) by mouth 2 (two) times daily. 180 tablet 3  . aspirin 81 MG tablet Take 1 tablet (81 mg total) by mouth daily. 30 tablet 6  . atorvastatin (LIPITOR) 10 MG tablet Take 1 tablet (10 mg total) by mouth daily. 90 tablet 2  . carvedilol (COREG)  3.125 MG tablet Take 1 tablet (3.125 mg total) by mouth 2 (two) times daily with a meal. 60 tablet 3  . cholecalciferol (VITAMIN D) 1000 units tablet Take 1,000 Units by mouth daily.    . cyclobenzaprine (FLEXERIL) 5 MG tablet Take 1 tablet (5 mg total) by mouth at bedtime. (Patient taking differently: Take 5 mg by mouth at bedtime as needed for muscle spasms. ) 30 tablet 4  . digoxin (LANOXIN) 0.125 MG tablet Take 1 tablet (0.125 mg total) by mouth every other day. 90 tablet 3  . famotidine (PEPCID) 20 MG tablet Take 1 tablet (20 mg total) by mouth 2 (two) times daily. 60 tablet 0  . fenofibrate (TRICOR) 145 MG tablet Take 1 tablet (145 mg total) by mouth daily. 90 tablet 3  .  furosemide (LASIX) 20 MG tablet Take 1 tablet (20 mg total) by mouth daily. 30 tablet 0  . losartan (COZAAR) 25 MG tablet Take 25 mg by mouth daily.    Marland Kitchen mexiletine (MEXITIL) 200 MG capsule Take 1 capsule (200 mg total) by mouth 3 (three) times daily. 90 capsule 3  . miconazole (MICOTIN) 200 MG vaginal suppository Place 1 suppository (200 mg total) vaginally at bedtime. 3 suppository 0  . oxyCODONE (ROXICODONE) 15 MG immediate release tablet Take 15 mg by mouth every 8 (eight) hours.     . pantoprazole (PROTONIX) 40 MG tablet Take 1 tablet (40 mg total) by mouth daily. 30 tablet 0  . penicillin v potassium (VEETID) 500 MG tablet Take 1 tablet (500 mg total) by mouth 3 (three) times daily. 21 tablet 0  . potassium chloride (K-DUR,KLOR-CON) 20 MEQ tablet Take 1 tablet (20 mEq total) by mouth daily. 30 tablet 6   No current facility-administered medications for this encounter.    Allergies  Allergen Reactions  . Amiodarone Swelling and Other (See Comments)    11/22 - tolerating pacerone this admit Headache and facial swelling; re-trial inpatient 07/07/2018   . Bee Venom Anaphylaxis  . Cortisone Swelling  . Mexiletine Other (See Comments)    (Caused bad heartburn) Dr. Haroldine Laws and Caryl Comes aware and are giving new trial on  06/30/18  . Plaquenil [Hydroxychloroquine] Other (See Comments)    Caused damage to the patient's heart (was being taken to treat Lupus)  . Robaxin [Methocarbamol] Hives and Itching  . Tape Rash and Other (See Comments)    "EATS ME UP"   Social History   Socioeconomic History  . Marital status: Single    Spouse name: Not on file  . Number of children: 3  . Years of education: Not on file  . Highest education level: Not on file  Occupational History  . Occupation: disabled    Fish farm manager: UNEMPLOYED  Social Needs  . Financial resource strain: Not hard at all  . Food insecurity:    Worry: Sometimes true    Inability: Sometimes true  . Transportation needs:    Medical: No    Non-medical: No  Tobacco Use  . Smoking status: Former Smoker    Packs/day: 0.50    Years: 15.00    Pack years: 7.50    Types: Cigarettes    Last attempt to quit: 05/17/2018    Years since quitting: 0.1  . Smokeless tobacco: Never Used  Substance and Sexual Activity  . Alcohol use: Yes    Alcohol/week: 0.0 standard drinks    Comment: occasionaly  . Drug use: No  . Sexual activity: Not Currently    Birth control/protection: Surgical  Lifestyle  . Physical activity:    Days per week: 0 days    Minutes per session: 0 min  . Stress: Not at all  Relationships  . Social connections:    Talks on phone: More than three times a week    Gets together: Once a week    Attends religious service: Never    Active member of club or organization: No    Attends meetings of clubs or organizations: Never    Relationship status: Never married  . Intimate partner violence:    Fear of current or ex partner: No    Emotionally abused: No    Physically abused: No    Forced sexual activity: No  Other Topics Concern  . Not on file  Social History Narrative  .  Not on file   Family History  Problem Relation Age of Onset  . Hepatitis Mother 73       HCV  . Liver cancer Mother 65  . Cancer Mother   . Diabetes  Mother   . Heart disease Mother   . Hyperlipidemia Mother   . Stroke Sister 45       x3  . Diabetes Sister   . Hyperlipidemia Sister   . Dementia Father   . HIV Sister   . Diabetes Brother   . Heart disease Brother   . Hyperlipidemia Brother    Vitals:   07/22/18 1024  BP: 108/64  Pulse: 68  SpO2: 97%  Weight: 91.9 kg (202 lb 9.6 oz)    Wt Readings from Last 3 Encounters:  07/22/18 91.9 kg (202 lb 9.6 oz)  07/20/18 92.5 kg (204 lb)  07/20/18 92.5 kg (204 lb)   Vest Reading 42%.   PHYSICAL EXAM: General:  Well appearing. No resp difficulty HEENT: normal Neck: supple. JVP to jaw . Carotids 2+ bilat; no bruits. No lymphadenopathy or thryomegaly appreciated. Cor: PMI nondisplaced. Regular rate & rhythm. No rubs, gallops or murmurs. Lungs: clear Abdomen: soft, nontender, nondistended. No hepatosplenomegaly. No bruits or masses. Good bowel sounds. Extremities: no cyanosis, clubbing, rash, edema Neuro: alert & orientedx3, cranial nerves grossly intact. moves all 4 extremities w/o difficulty. Affect pleasant  EKG: NSR 62 bpm No PVCs.   ASSESSMENT & PLAN:  1. Chronic systolic HF - She has long-standing mixed ischemic and NICM. EF 20-25% by echo 8/18. 13% by Myoview 11/18. S/p MDT ICD - Echo 03/2018 15-20%  NYHA II. Volume status elevated. Red Clip elevated and optivol. - Increase lasix to 40 mg daily.   - Continue losartan 25 mg daily.  - Continue carvedilol 3.125 mg twice a day +  digoxin 0.125 mg daily - Hold off on spiro for now with hyper/hypokalemia. BMET today.  - CPX 08/04/17 at least moderate limitation from cardiac perspective.  -Not interested in LVAD. Hopefully EF will improve with PVC suppression.    - Plan to repeat ECHO in few months.   2. CAD - s/p remote RCA stent.  06/2018 LHC-Dist LM lesion is 65% stenosed.  Prox RCA to Mid RCA lesion is 100% stenosed.  Ost Ramus lesion is 60% stenosed.  Non-stenotic Ramus lesion.  Prox LAD lesion is 70%  stenosed. - Continue ASA and statin. - Possible CABG if EF improves with PVC suppression.    3. HTN Stable.   4. Tobacco use Discussed smoking cessation.    5. VT - Managed by Dr. Caryl Comes. S/p MDT ICD.  - Tolerating brand amiodarone + mexiletine - S/p ICD shock 12/2017 in setting of K 2.5, mag 1.6. - She is aware that she should not drive until 04/4708.   6. SLE - Followed by Rheumatologist at [facility]. Has been quiescent for many years.  - No change to current plan.    7. Recent AKI - Resolved on labs 01/2018. BMET today  8. Palpitations No palpitations with amiodarone on board. .   9. PVCs Cut back amio to 200 mg twice a day. Continue mexiletine 200 mg three times a day. She has follow up with EP.   Follow up next week to reassess volume status and check BMET. She will also need EKG. Need to make sure she can get brand amiodarone. Intolerant generic.   Darrick Grinder, NP  10:41 AM

## 2018-07-22 ENCOUNTER — Ambulatory Visit (HOSPITAL_COMMUNITY)
Admit: 2018-07-22 | Discharge: 2018-07-22 | Disposition: A | Discharge status: Home / Self Care | Payer: Medicare Other | Source: Ambulatory Visit | Attending: Internal Medicine | Admitting: Internal Medicine

## 2018-07-22 ENCOUNTER — Encounter (HOSPITAL_COMMUNITY): Payer: Self-pay

## 2018-07-22 VITALS — BP 108/64 | HR 68 | Wt 202.6 lb

## 2018-07-22 DIAGNOSIS — I493 Ventricular premature depolarization: Secondary | ICD-10-CM | POA: Insufficient documentation

## 2018-07-22 DIAGNOSIS — E785 Hyperlipidemia, unspecified: Secondary | ICD-10-CM | POA: Insufficient documentation

## 2018-07-22 DIAGNOSIS — Z7982 Long term (current) use of aspirin: Secondary | ICD-10-CM | POA: Diagnosis not present

## 2018-07-22 DIAGNOSIS — Z720 Tobacco use: Secondary | ICD-10-CM

## 2018-07-22 DIAGNOSIS — Z95810 Presence of automatic (implantable) cardiac defibrillator: Secondary | ICD-10-CM | POA: Insufficient documentation

## 2018-07-22 DIAGNOSIS — Z818 Family history of other mental and behavioral disorders: Secondary | ICD-10-CM | POA: Diagnosis not present

## 2018-07-22 DIAGNOSIS — I2510 Atherosclerotic heart disease of native coronary artery without angina pectoris: Secondary | ICD-10-CM | POA: Diagnosis not present

## 2018-07-22 DIAGNOSIS — G4700 Insomnia, unspecified: Secondary | ICD-10-CM | POA: Insufficient documentation

## 2018-07-22 DIAGNOSIS — Z833 Family history of diabetes mellitus: Secondary | ICD-10-CM | POA: Diagnosis not present

## 2018-07-22 DIAGNOSIS — Z87891 Personal history of nicotine dependence: Secondary | ICD-10-CM | POA: Insufficient documentation

## 2018-07-22 DIAGNOSIS — I428 Other cardiomyopathies: Secondary | ICD-10-CM | POA: Insufficient documentation

## 2018-07-22 DIAGNOSIS — M329 Systemic lupus erythematosus, unspecified: Secondary | ICD-10-CM | POA: Diagnosis not present

## 2018-07-22 DIAGNOSIS — Z823 Family history of stroke: Secondary | ICD-10-CM | POA: Insufficient documentation

## 2018-07-22 DIAGNOSIS — Z888 Allergy status to other drugs, medicaments and biological substances status: Secondary | ICD-10-CM | POA: Insufficient documentation

## 2018-07-22 DIAGNOSIS — Z8249 Family history of ischemic heart disease and other diseases of the circulatory system: Secondary | ICD-10-CM | POA: Diagnosis not present

## 2018-07-22 DIAGNOSIS — Z79899 Other long term (current) drug therapy: Secondary | ICD-10-CM | POA: Diagnosis not present

## 2018-07-22 DIAGNOSIS — I110 Hypertensive heart disease with heart failure: Secondary | ICD-10-CM | POA: Diagnosis not present

## 2018-07-22 DIAGNOSIS — R002 Palpitations: Secondary | ICD-10-CM | POA: Diagnosis not present

## 2018-07-22 DIAGNOSIS — I472 Ventricular tachycardia: Secondary | ICD-10-CM | POA: Diagnosis not present

## 2018-07-22 DIAGNOSIS — G8929 Other chronic pain: Secondary | ICD-10-CM | POA: Insufficient documentation

## 2018-07-22 DIAGNOSIS — E119 Type 2 diabetes mellitus without complications: Secondary | ICD-10-CM | POA: Diagnosis not present

## 2018-07-22 DIAGNOSIS — I5022 Chronic systolic (congestive) heart failure: Secondary | ICD-10-CM | POA: Insufficient documentation

## 2018-07-22 DIAGNOSIS — F329 Major depressive disorder, single episode, unspecified: Secondary | ICD-10-CM | POA: Diagnosis not present

## 2018-07-22 DIAGNOSIS — I252 Old myocardial infarction: Secondary | ICD-10-CM | POA: Diagnosis not present

## 2018-07-22 DIAGNOSIS — Z955 Presence of coronary angioplasty implant and graft: Secondary | ICD-10-CM | POA: Diagnosis not present

## 2018-07-22 DIAGNOSIS — K219 Gastro-esophageal reflux disease without esophagitis: Secondary | ICD-10-CM | POA: Diagnosis not present

## 2018-07-22 DIAGNOSIS — Z800 Family history of malignant neoplasm of digestive organs: Secondary | ICD-10-CM | POA: Insufficient documentation

## 2018-07-22 LAB — BASIC METABOLIC PANEL
ANION GAP: 9 (ref 5–15)
BUN: 13 mg/dL (ref 6–20)
CHLORIDE: 103 mmol/L (ref 98–111)
CO2: 27 mmol/L (ref 22–32)
Calcium: 9.4 mg/dL (ref 8.9–10.3)
Creatinine, Ser: 0.92 mg/dL (ref 0.44–1.00)
GFR calc non Af Amer: >60 mL/min (ref >60)
GLUCOSE: 104 mg/dL — AB (ref 70–99)
Potassium: 3.9 mmol/L (ref 3.5–5.1)
Sodium: 139 mmol/L (ref 135–145)

## 2018-07-22 LAB — MAGNESIUM: Magnesium: 1.8 mg/dL (ref 1.7–2.4)

## 2018-07-22 MED ORDER — FUROSEMIDE 40 MG PO TABS: 40.0000 mg | ORAL_TABLET | Freq: Every day | ORAL | 6 refills | Status: DC

## 2018-07-22 MED ORDER — AMIODARONE HCL 200 MG PO TABS: 200.0000 mg | ORAL_TABLET | Freq: Two times a day (BID) | ORAL | 6 refills | Status: DC

## 2018-07-22 NOTE — Progress Notes (Signed)
ReDS Vest - 07/22/18 1100      ReDS Vest   MR   Moderate    Fitting Posture  Sitting   station D   Height Marker  Tall    Ruler Value  31    Center Strip  Aligned    ReDS Value  40

## 2018-07-22 NOTE — Patient Instructions (Addendum)
DECREASE Amiodarone to 200mg  (1 tab) twice a day  INCREASE Lasix to 40mg  (1 tab) daily  Labs today We will only contact you if something comes back abnormal or we need to make some changes. Otherwise no news is good news!   Your physician recommends that you schedule a follow-up appointment in: Follow up Next Wednesday 07/28/2018 at 2pm. Please keep upcoming appointments

## 2018-07-23 ENCOUNTER — Other Ambulatory Visit (HOSPITAL_COMMUNITY): Payer: Self-pay

## 2018-07-23 MED ORDER — MAGNESIUM OXIDE 400 MG PO TABS: 200.0000 mg | ORAL_TABLET | Freq: Every day | ORAL | 0 refills | Status: AC

## 2018-07-25 ENCOUNTER — Encounter: Payer: Self-pay | Admitting: Family Medicine

## 2018-07-25 NOTE — Assessment & Plan Note (Signed)
Hospital course and study results reviewed with the patient and her daughter who accompanies her. [name] has a good understanding of the severity of her condition. She has stopped smoking and states that the desire is totally gone Follow up is in the next 2 days with cardiology. She will be treated for sinsusitis and will return for her flu vaccine in the next 7 to 10 days

## 2018-07-25 NOTE — Assessment & Plan Note (Signed)
Penicillin and antifungal prescribed and saline nasal flushes encouraged

## 2018-07-25 NOTE — Progress Notes (Signed)
   [name]     MRN: [id number]      DOB: 1965/11/17   HPI [name] is here for follow up of recent hospitalization from 11/13 to 11/16 when admitted hypotension and viral URI. At the time of d/c she developed severe chest pain and cardiac cath on 11/17  Showed in stent stenosis, finally discharged on 07/09/2018 possible candidate for CABG or VAD candidate,  Still c/o sinus pressure and post nasal drainage causing uncontrolled cough and sputum States she has stopped smoking since hospitalization Hospital course is reviewed and med reconciliation  done  ROS See HPI  Denies abdominal pain, nausea, vomiting,diarrhea or constipation.   Denies dysuria, frequency, hesitancy or incontinence. Denies joint pain, swelling and limitation in mobility. Denies headaches, seizures, numbness, or tingling. Denies depression, uncontrolled anxiety or insomnia.Takes medication Denies skin break down or rash.   PE  BP 90/60   Pulse 64   Temp (!) 97.1 F (36.2 C) (Oral)   Resp 15   Ht 5\' 6"  (1.676 m)   Wt 204 lb (92.5 kg)   SpO2 96%   BMI 32.93 kg/m   Patient alert and oriented and in no cardiopulmonary distress.  HEENT: No facial asymmetry, EOMI,   oropharynx pink and moist.  Neck supple no JVD, no mass.Maxillary sinus tenderness, TM clear, no cervical adenopathy  Chest: decreased air entry , no crackles or wheezes heard  CVS: S1, S2 , no  murmur, no S3.Regular rate.  ABD: Soft non tender.   Ext: No edema  MS: Adequate though reduced  ROM spine, shoulders, hips and knees.  Skin: Intact, no ulcerations or rash noted.  Psych: Good eye contact, normal affect. Memory intact not anxious or depressed appearing.  CNS: CN 2-12 intact, power,  normal throughout.no focal deficits noted.   [facility] discharge follow-up Hospital course and study results reviewed with the patient and her daughter who accompanies her. [name] has a good understanding of the  severity of her condition. She has stopped smoking and states that the desire is totally gone Follow up is in the next 2 days with cardiology. She will be treated for sinsusitis and will return for her flu vaccine in the next 7 to 10 days  Acute sinus infection Penicillin and antifungal prescribed and saline nasal flushes encouraged

## 2018-07-26 ENCOUNTER — Ambulatory Visit (INDEPENDENT_AMBULATORY_CARE_PROVIDER_SITE_OTHER): Payer: Medicare Other

## 2018-07-26 ENCOUNTER — Other Ambulatory Visit: Payer: Self-pay | Admitting: *Deleted

## 2018-07-26 DIAGNOSIS — I255 Ischemic cardiomyopathy: Secondary | ICD-10-CM

## 2018-07-26 DIAGNOSIS — T827XXA Infection and inflammatory reaction due to other cardiac and vascular devices, implants and grafts, initial encounter: Secondary | ICD-10-CM

## 2018-07-26 DIAGNOSIS — I5022 Chronic systolic (congestive) heart failure: Secondary | ICD-10-CM

## 2018-07-26 DIAGNOSIS — T827XXD Infection and inflammatory reaction due to other cardiac and vascular devices, implants and grafts, subsequent encounter: Secondary | ICD-10-CM

## 2018-07-26 NOTE — Progress Notes (Signed)
EPIC Encounter for ICM Monitoring  Patient Name: [name] is a 52 y.o. female Date: 07/26/2018 Primary Care Physican: Fayrene Helper, MD Primary Cardiologist: Klein/Bensimhon Electrophysiologist: Caryl Comes Last Weight: 206 lbs Today's Weight: 198.8 lbs        Heart Failure questions reviewed, pt asymptomatic.  Discussed diet and healthy options to lower salt intake and how to read the foods labels.     Thoracic impedance returned to normal for first time since 05/24/2018 and increasing Furosemide to 40 mg daily on 07/22/2018 HF clinic visit.     Prescribed: Furosemide 40 mg 1 tablet daily   Labs: 06/02/2018 Creatinine 0.79, BUN 5,   Potassium 3.0, Sodium 139, eGFR 85-98 (low potassium has been addressed by Dr Moshe Cipro) 04/13/2018 Creatinine 0.84, BUN 5, Potassium 4.3, Sodium 141, EGFR >60 02/08/2018 Creatinine 0.69, BUN 8, Potassium 3.7, Sodium 139, EGFR 100-116 01/26/2018 Creatinine 0.85, BUN 7, Potassium 3.2, Sodium 137, EGFR >60 01/05/2018 Creatinine0.99, BUN8, Potassium4.6, Sodium136, EGFR>60, Magnesium 2.0 01/02/2018 Creatinine1.50, BUN26, Potassium4.2, Sodium133, [id number] at 8:28 AM  01/02/2018 Creatinine1.74, BUN29, Potassium6.2, Sodium133, [id number] at 4:34 AM  01/02/2018 Creatinine1.93, BUN29, Potassium5.0, Sodium135, [id number] at 1:06 AM  01/01/2018 Creatinine2.59, OTL57, Potassium>7.6 and dropped to 5.8 at (11:11 PM), [id number], [id number] at 7:23 PM, Magnesium 2.9 01/01/2018 Creatinine2.07, BUN31, Potassium6.5, Sodium129, [id number] at 4:13 PM, Magnesium 2.7  Recommendations: No changes.  Advised to limit salt intake to 2037m daily. Encouraged to call for fluid symptoms.  Follow-up plan: ICM clinic phone appointment on 09/27/2018.  Office appointment scheduled 07/28/2018 with HF Clinic NP/PA and 08/06/2018 with Dr BHaroldine Lawson 08/26/2018 with Dr. KCaryl Comes    Copy of ICM check sent to Dr. KCaryl Comes   3 month ICM trend:  07/26/2018    1 Year ICM trend:       LRosalene Billings RN 07/26/2018 11:46 AM

## 2018-07-26 NOTE — Patient Outreach (Signed)
Telephone call to pt for follow up after seeing MD at HF clinic, spoke with pt, HIPAA verified, pt reports she did attend HF clinic appointment on 07/22/18 and amiodarone lowered to 200mg  and lasix increased to 40mg  and states " they said I have fluid around my heart, they checked it with a machine"  Pt states she weighed for 2 days and has now decided not to weigh and states " I'm just not gonna do it"  Pt to follow up HF clinic 07/28/18. No other concerns reported.  PLAN Call pt for telephonic assessment next month  Jacqlyn Larsen [facility], [place] Coordinator [phone]

## 2018-07-27 NOTE — Progress Notes (Signed)
Remote ICD transmission.   

## 2018-07-28 ENCOUNTER — Encounter (HOSPITAL_COMMUNITY): Payer: Self-pay

## 2018-07-28 ENCOUNTER — Other Ambulatory Visit: Payer: Self-pay

## 2018-07-28 ENCOUNTER — Ambulatory Visit (HOSPITAL_COMMUNITY)
Admission: RE | Admit: 2018-07-28 | Discharge: 2018-07-28 | Disposition: A | Discharge status: Home / Self Care | Payer: Medicare Other | Source: Ambulatory Visit | Attending: Cardiology | Admitting: Cardiology

## 2018-07-28 VITALS — BP 122/76 | Wt 205.8 lb

## 2018-07-28 DIAGNOSIS — I110 Hypertensive heart disease with heart failure: Secondary | ICD-10-CM | POA: Insufficient documentation

## 2018-07-28 DIAGNOSIS — M1711 Unilateral primary osteoarthritis, right knee: Secondary | ICD-10-CM | POA: Insufficient documentation

## 2018-07-28 DIAGNOSIS — I472 Ventricular tachycardia, unspecified: Secondary | ICD-10-CM

## 2018-07-28 DIAGNOSIS — G4700 Insomnia, unspecified: Secondary | ICD-10-CM | POA: Insufficient documentation

## 2018-07-28 DIAGNOSIS — I493 Ventricular premature depolarization: Secondary | ICD-10-CM

## 2018-07-28 DIAGNOSIS — K219 Gastro-esophageal reflux disease without esophagitis: Secondary | ICD-10-CM | POA: Diagnosis not present

## 2018-07-28 DIAGNOSIS — E119 Type 2 diabetes mellitus without complications: Secondary | ICD-10-CM | POA: Insufficient documentation

## 2018-07-28 DIAGNOSIS — I5022 Chronic systolic (congestive) heart failure: Secondary | ICD-10-CM | POA: Diagnosis not present

## 2018-07-28 DIAGNOSIS — Z95810 Presence of automatic (implantable) cardiac defibrillator: Secondary | ICD-10-CM | POA: Insufficient documentation

## 2018-07-28 DIAGNOSIS — Z8249 Family history of ischemic heart disease and other diseases of the circulatory system: Secondary | ICD-10-CM | POA: Diagnosis not present

## 2018-07-28 DIAGNOSIS — Z87891 Personal history of nicotine dependence: Secondary | ICD-10-CM | POA: Insufficient documentation

## 2018-07-28 DIAGNOSIS — I428 Other cardiomyopathies: Secondary | ICD-10-CM | POA: Diagnosis not present

## 2018-07-28 DIAGNOSIS — I2510 Atherosclerotic heart disease of native coronary artery without angina pectoris: Secondary | ICD-10-CM | POA: Diagnosis not present

## 2018-07-28 DIAGNOSIS — G8929 Other chronic pain: Secondary | ICD-10-CM | POA: Diagnosis not present

## 2018-07-28 DIAGNOSIS — T827XXA Infection and inflammatory reaction due to other cardiac and vascular devices, implants and grafts, initial encounter: Secondary | ICD-10-CM | POA: Diagnosis not present

## 2018-07-28 DIAGNOSIS — Z79899 Other long term (current) drug therapy: Secondary | ICD-10-CM | POA: Diagnosis not present

## 2018-07-28 DIAGNOSIS — I252 Old myocardial infarction: Secondary | ICD-10-CM | POA: Insufficient documentation

## 2018-07-28 DIAGNOSIS — R002 Palpitations: Secondary | ICD-10-CM | POA: Diagnosis not present

## 2018-07-28 DIAGNOSIS — M329 Systemic lupus erythematosus, unspecified: Secondary | ICD-10-CM | POA: Insufficient documentation

## 2018-07-28 DIAGNOSIS — Z91030 Bee allergy status: Secondary | ICD-10-CM | POA: Diagnosis not present

## 2018-07-28 DIAGNOSIS — F329 Major depressive disorder, single episode, unspecified: Secondary | ICD-10-CM | POA: Diagnosis not present

## 2018-07-28 DIAGNOSIS — N179 Acute kidney failure, unspecified: Secondary | ICD-10-CM | POA: Insufficient documentation

## 2018-07-28 DIAGNOSIS — Z888 Allergy status to other drugs, medicaments and biological substances status: Secondary | ICD-10-CM | POA: Diagnosis not present

## 2018-07-28 DIAGNOSIS — E785 Hyperlipidemia, unspecified: Secondary | ICD-10-CM | POA: Diagnosis not present

## 2018-07-28 DIAGNOSIS — Z720 Tobacco use: Secondary | ICD-10-CM

## 2018-07-28 DIAGNOSIS — Z7982 Long term (current) use of aspirin: Secondary | ICD-10-CM | POA: Diagnosis not present

## 2018-07-28 DIAGNOSIS — Z833 Family history of diabetes mellitus: Secondary | ICD-10-CM | POA: Insufficient documentation

## 2018-07-28 LAB — BASIC METABOLIC PANEL
ANION GAP: 11 (ref 5–15)
BUN: 10 mg/dL (ref 6–20)
CALCIUM: 8.6 mg/dL — AB (ref 8.9–10.3)
CHLORIDE: 105 mmol/L (ref 98–111)
CO2: 24 mmol/L (ref 22–32)
CREATININE: 0.82 mg/dL (ref 0.44–1.00)
GFR calc Af Amer: >60 mL/min (ref >60)
Glucose, Bld: 105 mg/dL — ABNORMAL HIGH (ref 70–99)
POTASSIUM: 3.2 mmol/L — AB (ref 3.5–5.1)
SODIUM: 140 mmol/L (ref 135–145)

## 2018-07-28 LAB — BRAIN NATRIURETIC PEPTIDE: B Natriuretic Peptide: 138.9 pg/mL — ABNORMAL HIGH (ref 0.0–100.0)

## 2018-07-28 LAB — MAGNESIUM: MAGNESIUM: 1.6 mg/dL — AB (ref 1.7–2.4)

## 2018-07-28 NOTE — Progress Notes (Signed)
2

## 2018-07-28 NOTE — Progress Notes (Signed)
ReDS Vest - 07/28/18 1400      ReDS Vest   Fitting Posture  Sitting   station C   Height Marker  Tall    Ruler Value  31    Center Strip  Aligned    ReDS Value  42

## 2018-07-28 NOTE — Progress Notes (Signed)
Advanced Heart Failure Clinic Note   Referring Physician: Dr, Caryl Comes Primary Care: Tula Nakayama, MD Primary Cardiologist: Dr. Caryl Comes  HPI: [name] is a 52 y.o. female with h/o CAD s/p RCA stenting in 2841, severe systolic HF due to mixed cardiomyopathy EF , recurrent VT s/p MDT ICD, SLE, GERD and ongoing tobacco use.    EF 8/18: EF 20-25%. RV normal. Dilated LV with moderate MR   Last cath 2004 EF 29% Left main has a distal 30% stenosis. Left anterior descending artery has an ostial 30% stenosis.    Left circumflex gives rise to a very large branching ramus intermedius, two  small marginal branches, and two small posterolateral branches.  The ramus  intermedius gives off two branches.  The more medial branch has a 95%  stenosis at its ostium.  However, this is a small caliber branch being less  than or equal to 1.5 mm in diameter.  The circumflex otherwise has minor  luminal irregularities, but no significant stenoses.  Right coronary artery has a 20% stenosis in the mid vessel.  Just distal to  this there is a stent in the distal portion of the mid vessel which is  widely patent with 0% stenosis within the stent.    CT scan chest: 11/18. No PE. Mild ground glass opacitites. No COPD mentioned  She was admitted 5/9-5/05/2018 for syncope secondary to VT/VF. She was shocked by her ICD. Cardiology was consulted. Her potassium was 2.5 and magnesium was 1.6, so supplements were increased. Volume was stable.  She was admitted 5/17-5/18 with hyperkalemia >7.5 and AKI with creatinine 2.59. Potassium was corrected with kayexelate, albuterol, insulin/D50, and calcium gluconate. At discharge, potassium supplement, lasix, spiro, and ARB were stopped. K 4.2 and creatinine 1.55 on day of discharge.   Admitted to [facility] 07/05/2018 for re trial of IV amiodarone. She tolerate amiodarone without s/s. She was transitioned to amiodarone 400 mg twice a day. She is intolerant generic  amiodarone due to the dye. She was provided with a 3 month supply of amiodarone. Plan to reassess ECHO to see if PVC suppression improves EF.   Admitted 11/18 through 07/09/2018. Admitted for amiodarone challenge and started on IV amiodarone. She then transitioned to PO with no s/s allergy. Tablet noted to be a different color than previous tablet that she reacted to. PVCs were suppressed with only 0-1 PVC/min on telemetry by day of discharge. She was provided a 15-month supply of amiodarone through TOC .   Today she returns for 1 week follow up. Last week, lasix was increased and amiodarone dose was decreased. She is feeling good today. EKG stable without PVCs. Weight at home down to around 200 lbs. Her weight is actually up 3 lbs by our scale. She denies any SOB, PND, Orthopnea, lightheadedness or dizziness. Able to walk around the store and run errands without any difficulty. Taking all her medication as directed. She is also no longer smoking.   Medtronic: Personally reviewed from 07/26/18. Showed normalization of thoracic impedence and optivol for the first time since October after lasix increased to 40 mg daily.   [place] 06/29/2018    RA = 5 RV = 34/5 PA = 40/11 (21) PCW = 9 Fick cardiac output/index = 4.3/2.1 PVR = 2.8 WU Ao sat = 93% PA sat = 48%, 56%   LHC 06/24/18  Dist LM lesion is 65% stenosed.  Prox RCA to Mid RCA lesion is 100% stenosed.  Ost Ramus lesion is 60% stenosed.  Non-stenotic Ramus lesion.  Prox LAD lesion is 70% stenosed RHC  Myoview 06/30/17: EF 13% There is a large defect of severe severity present in the basal anteroseptal, basal inferoseptal, basal inferior, mid anteroseptal, mid inferoseptal, mid inferior and apical inferior location.  CPX 08/04/17 Pre-Exercise PFTs  FVC 2.21 (71%)    FEV1 1.78 (72%)     FEV1/FVC 81 (100%)     MVV 69 (69%)  Exercise Time:  6:15  Speed (mph): 2.0    Grade (%): 7.0   RPE: 16 Reason stopped: patient ended  test due to being lightheaded (9/10) Additional symptoms: dyspnea (7/10)  Resting HR: 79 Peak HR: 94  (54% age predicted max HR) BP rest: 86/62 BP peak: 114/70 Peak VO2: 10.0 (50% predicted peak VO2) VE/VCO2 slope: 46 OUES: 1.04 Peak RER: 0.91 Ventilatory Threshold: 9.4 (47% predicted or measured peak VO2) Peak RR 44 Peak Ventilation: 38.3 VE/MVV: 56% PETCO2 at peak: 26 O2pulse: 10  (91% predicted O2pulse)  Review of systems complete and found to be negative unless listed in HPI.    Past Medical History:  Diagnosis Date  . Arthritis of knee   . Automatic implantable cardiac defibrillator in situ    a. s/p prior Medtronic ICD with 6949 lead. b. s/p Gen change & lead revision 05/2013.  . Cardiomyopathy secondary    Patient has cardiomyopathy out of proportion to her ischemic heart disease  . Chronic pain   . coronary artery disease    RCA stenting Myoview 2011 EF 30% infarction dilatation without ischemia  . Coronary artery disease   . Depression   . Diabetes mellitus   . GERD (gastroesophageal reflux disease)   . Insomnia   . Lupus (systemic lupus erythematosus) ([place])   . Membranous glomerulonephritis   . Migraines   . Myocardial infarction ([place])    8 total   . Nicotine abuse   . Other and unspecified hyperlipidemia   . Paroxysmal VT ([place])   . Systolic CHF ([place])   . VF (ventricular fibrillation) ([place])    a. Hx appropriate ICD therapy for VF.   Current Outpatient Medications  Medication Sig Dispense Refill  . albuterol (PROVENTIL HFA;VENTOLIN HFA) 108 (90 Base) MCG/ACT inhaler Inhale 2 puffs into the lungs every 6 (six) hours as needed for wheezing or shortness of breath. 1 Inhaler 0  . ALPRAZolam (XANAX) 1 MG tablet Take 1 tablet (1 mg total) by mouth at bedtime. (Patient taking differently: Take 1 mg by mouth at bedtime as needed for sleep. ) 30 tablet 4  . amiodarone (PACERONE) 200 MG tablet Take 1 tablet (200 mg total) by mouth 2 (two) times daily. 30 tablet  6  . aspirin 81 MG tablet Take 1 tablet (81 mg total) by mouth daily. 30 tablet 6  . atorvastatin (LIPITOR) 10 MG tablet Take 1 tablet (10 mg total) by mouth daily. 90 tablet 2  . carvedilol (COREG) 3.125 MG tablet Take 1 tablet (3.125 mg total) by mouth 2 (two) times daily with a meal. 60 tablet 3  . cholecalciferol (VITAMIN D) 1000 units tablet Take 1,000 Units by mouth daily.    . cyclobenzaprine (FLEXERIL) 5 MG tablet Take 1 tablet (5 mg total) by mouth at bedtime. (Patient taking differently: Take 5 mg by mouth at bedtime as needed for muscle spasms. ) 30 tablet 4  . digoxin (LANOXIN) 0.125 MG tablet Take 1 tablet (0.125 mg total) by mouth every other day. 90 tablet 3  . famotidine (PEPCID) 20 MG tablet  Take 1 tablet (20 mg total) by mouth 2 (two) times daily. 60 tablet 0  . fenofibrate (TRICOR) 145 MG tablet Take 1 tablet (145 mg total) by mouth daily. 90 tablet 3  . furosemide (LASIX) 40 MG tablet Take 1 tablet (40 mg total) by mouth daily. 30 tablet 6  . losartan (COZAAR) 25 MG tablet Take 25 mg by mouth daily.    . magnesium oxide (MAG-OX) 400 MG tablet Take 0.5 tablets (200 mg total) by mouth daily. Take 1/2 tab (200 mg total) by mouth once daily. 45 tablet 0  . mexiletine (MEXITIL) 200 MG capsule Take 1 capsule (200 mg total) by mouth 3 (three) times daily. 90 capsule 3  . oxyCODONE (ROXICODONE) 15 MG immediate release tablet Take 15 mg by mouth every 8 (eight) hours.     . pantoprazole (PROTONIX) 40 MG tablet Take 1 tablet (40 mg total) by mouth daily. 30 tablet 0  . penicillin v potassium (VEETID) 500 MG tablet Take 1 tablet (500 mg total) by mouth 3 (three) times daily. 21 tablet 0  . potassium chloride (K-DUR,KLOR-CON) 20 MEQ tablet Take 1 tablet (20 mEq total) by mouth daily. 30 tablet 6  . miconazole (MICOTIN) 200 MG vaginal suppository Place 1 suppository (200 mg total) vaginally at bedtime. (Patient not taking: Reported on 07/28/2018) 3 suppository 0   No current  facility-administered medications for this encounter.    Allergies  Allergen Reactions  . Amiodarone Swelling and Other (See Comments)    11/22 - tolerating pacerone this admit Headache and facial swelling; re-trial inpatient 07/07/2018   . Bee Venom Anaphylaxis  . Cortisone Swelling  . Mexiletine Other (See Comments)    (Caused bad heartburn) Dr. Haroldine Laws and Caryl Comes aware and are giving new trial on 06/30/18  . Plaquenil [Hydroxychloroquine] Other (See Comments)    Caused damage to the patient's heart (was being taken to treat Lupus)  . Robaxin [Methocarbamol] Hives and Itching  . Tape Rash and Other (See Comments)    "EATS ME UP"   Social History   Socioeconomic History  . Marital status: Single    Spouse name: Not on file  . Number of children: 2  . Years of education: Not on file  . Highest education level: Master's degree (e.g., MA, MS, MEng, MEd, MSW, MBA)  Occupational History  . Occupation: disabled    Fish farm manager: UNEMPLOYED  Social Needs  . Financial resource strain: Somewhat hard  . Food insecurity:    Worry: Never true    Inability: Never true  . Transportation needs:    Medical: No    Non-medical: No  Tobacco Use  . Smoking status: Former Smoker    Packs/day: 0.50    Years: 15.00    Pack years: 7.50    Types: Cigarettes    Last attempt to quit: 05/17/2018    Years since quitting: 0.1  . Smokeless tobacco: Never Used  Substance and Sexual Activity  . Alcohol use: Yes    Alcohol/week: 0.0 standard drinks    Comment: occasionaly  . Drug use: No  . Sexual activity: Not Currently    Birth control/protection: Surgical  Lifestyle  . Physical activity:    Days per week: 0 days    Minutes per session: 0 min  . Stress: Not at all  Relationships  . Social connections:    Talks on phone: More than three times a week    Gets together: Once a week    Attends religious service: Never  Active member of club or organization: No    Attends meetings of clubs or  organizations: Never    Relationship status: Never married  . Intimate partner violence:    Fear of current or ex partner: No    Emotionally abused: No    Physically abused: No    Forced sexual activity: No  Other Topics Concern  . Not on file  Social History Narrative  . Not on file   Family History  Problem Relation Age of Onset  . Hepatitis Mother 19       HCV  . Liver cancer Mother 26  . Cancer Mother   . Diabetes Mother   . Heart disease Mother   . Hyperlipidemia Mother   . Stroke Sister 45       x3  . Diabetes Sister   . Hyperlipidemia Sister   . Dementia Father   . HIV Sister   . Diabetes Brother   . Heart disease Brother   . Hyperlipidemia Brother    Vitals:   07/28/18 1430  BP: 122/76  Weight: 93.4 kg (205 lb 12.8 oz)    Wt Readings from Last 3 Encounters:  07/28/18 93.4 kg (205 lb 12.8 oz)  07/22/18 91.9 kg (202 lb 9.6 oz)  07/20/18 92.5 kg (204 lb)     PHYSICAL EXAM: General: NAD HEENT: Normal Neck: Supple. JVP 6-7 cm. Carotids 2+ bilat; no bruits. No thyromegaly or nodule noted. Cor: PMI nondisplaced. RRR, No M/G/R noted Lungs: CTAB, normal effort. Abdomen: Soft, non-tender, non-distended, no HSM. No bruits or masses. +BS  Extremities: No cyanosis, clubbing, or rash. R and LLE no edema.  Neuro: Alert & orientedx3, cranial nerves grossly intact. moves all 4 extremities w/o difficulty. Affect pleasant   EKG: NSR 62 bpm, personally reviewed.   ASSESSMENT & PLAN:  1. Chronic systolic HF - She has long-standing mixed ischemic and NICM. EF 20-25% by echo 8/18. 13% by Myoview 11/18. S/p MDT ICD - Echo 03/2018 15-20%  - NYHA II. Volume status stable on exam. Her ReDs vest is 42%, but she is at the same weight as her cath 06/29/18, and CVP 5 at that time.  - Continue lasix 40 mg daily.  BMET today with recent increase.  - Continue losartan 25 mg daily. Consider increase at next visit.  - Continue carvedilol 3.125 mg twice a day +  digoxin 0.125 mg  daily - Hold off on spiro for now with hyper/hypokalemia.  - CPX 08/04/17 at least moderate limitation from cardiac perspective.  - Not interested in LVAD. Hopefully EF will improve with PVC suppression.    - Plan to repeat ECHO in few months.   2. CAD - s/p remote RCA stent.  06/2018 LHC-Dist LM lesion is 65% stenosed.  Prox RCA to Mid RCA lesion is 100% stenosed.  Ost Ramus lesion is 60% stenosed.  Non-stenotic Ramus lesion.  Prox LAD lesion is 70% stenosed. - Continue ASA and statin. - Possible CABG if EF improves with PVC suppression.   - No s/s ischemia.   3. HTN - Stable on meds as above.   4. Tobacco use Discussed smoking cessation. No change.   5. VT - Managed by Dr. Caryl Comes. S/p MDT ICD.  - Tolerating brand amiodarone + mexiletine - S/p ICD shock 12/2017 in setting of K 2.5, mag 1.6. She can now drive.   6. SLE - [facility]. Has been quiescent for many years.  - No change.  7. Recent AKI - Resolved. BMET today with diuretic changes.   8. Palpitations - No palpitations with amiodarone on board. No change.    9. PVCs - Continue amio 200 mg twice a day. Allergic to generic amiodarone.  - Continue mexiletine 200 mg three times a day. She has follow up with EP in January.   RTC 3-4 weeks. Will plan to see Dr. Haroldine Laws with Echo in February.   Shirley Friar, PA-C  2:34 PM   Greater than 50% of the 25 minute visit was spent in counseling/coordination of care regarding disease state education, salt/fluid restriction, sliding scale diuretics, and medication compliance.

## 2018-07-28 NOTE — Patient Instructions (Signed)
Labs today We will only contact you if something comes back abnormal or we need to make some changes. Otherwise no news is good news!  Your physician recommends that you schedule a follow-up appointment in: 3-4 weeks with NP/PA clinic  Your physician recommends that you schedule a follow-up appointment in: 2 months with Dr. Haroldine Laws with an Echo  Your physician has requested that you have an echocardiogram. Echocardiography is a painless test that uses sound waves to create images of your heart. It provides your doctor with information about the size and shape of your heart and how well your heart's chambers and valves are working. This procedure takes approximately one hour. There are no restrictions for this procedure.  Do the following things EVERYDAY: 1) Weigh yourself in the morning before breakfast. Write it down and keep it in a log. 2) Take your medicines as prescribed 3) Eat low salt foods-Limit salt (sodium) to 2000 mg per day.  4) Stay as active as you can everyday 5) Limit all fluids for the day to less than 2 liters

## 2018-08-03 ENCOUNTER — Other Ambulatory Visit: Payer: Self-pay | Admitting: Cardiology

## 2018-08-03 ENCOUNTER — Other Ambulatory Visit (HOSPITAL_COMMUNITY): Payer: Self-pay | Admitting: Unknown Physician Specialty

## 2018-08-03 DIAGNOSIS — K219 Gastro-esophageal reflux disease without esophagitis: Secondary | ICD-10-CM

## 2018-08-03 MED ORDER — PANTOPRAZOLE SODIUM 40 MG PO TBEC: 40.0000 mg | DELAYED_RELEASE_TABLET | Freq: Every day | ORAL | 6 refills | Status: DC

## 2018-08-05 ENCOUNTER — Ambulatory Visit (HOSPITAL_COMMUNITY)
Admission: RE | Admit: 2018-08-05 | Discharge: 2018-08-05 | Disposition: A | Discharge status: Home / Self Care | Payer: Medicare Other | Source: Ambulatory Visit | Attending: Internal Medicine | Admitting: Internal Medicine

## 2018-08-05 DIAGNOSIS — I255 Ischemic cardiomyopathy: Secondary | ICD-10-CM | POA: Diagnosis not present

## 2018-08-05 LAB — BASIC METABOLIC PANEL
Anion gap: 8 (ref 5–15)
BUN: 15 mg/dL (ref 6–20)
CO2: 26 mmol/L (ref 22–32)
Calcium: 9.3 mg/dL (ref 8.9–10.3)
Chloride: 102 mmol/L (ref 98–111)
Creatinine, Ser: 1.32 mg/dL — ABNORMAL HIGH (ref 0.44–1.00)
GFR calc non Af Amer: 46 mL/min — ABNORMAL LOW (ref >60)
GFR, EST AFRICAN AMERICAN: 53 mL/min — AB (ref >60)
Glucose, Bld: 104 mg/dL — ABNORMAL HIGH (ref 70–99)
Potassium: 4.1 mmol/L (ref 3.5–5.1)
SODIUM: 136 mmol/L (ref 135–145)

## 2018-08-05 LAB — MAGNESIUM: Magnesium: 2.1 mg/dL (ref 1.7–2.4)

## 2018-08-06 ENCOUNTER — Encounter (HOSPITAL_COMMUNITY): Payer: Medicare Other | Admitting: Internal Medicine

## 2018-08-06 ENCOUNTER — Other Ambulatory Visit: Payer: Self-pay | Admitting: Internal Medicine

## 2018-08-23 ENCOUNTER — Other Ambulatory Visit: Payer: Self-pay | Admitting: Family Medicine

## 2018-08-23 ENCOUNTER — Other Ambulatory Visit: Payer: Self-pay | Admitting: *Deleted

## 2018-08-23 NOTE — Patient Outreach (Signed)
Outreach to pt for telephonic assessment, unidentified female answered the phone and said "hello" multiple times and hung up,  RN CM mailed unsuccessful outreach letter requesting return phone call.  PLAN Outreach pt in 3-4 business days  Jacqlyn Larsen [facility], [place], [place] [phone]

## 2018-08-24 ENCOUNTER — Encounter (HOSPITAL_COMMUNITY): Payer: Self-pay

## 2018-08-24 ENCOUNTER — Ambulatory Visit (HOSPITAL_COMMUNITY): Admission: RE | Admit: 2018-08-24 | Payer: Medicare Other | Source: Ambulatory Visit

## 2018-08-24 ENCOUNTER — Other Ambulatory Visit: Payer: Self-pay

## 2018-08-24 ENCOUNTER — Ambulatory Visit (HOSPITAL_COMMUNITY)
Admission: RE | Admit: 2018-08-24 | Discharge: 2018-08-24 | Disposition: A | Discharge status: Home / Self Care | Payer: Medicare Other | Source: Ambulatory Visit | Attending: Cardiology | Admitting: Cardiology

## 2018-08-24 ENCOUNTER — Ambulatory Visit (INDEPENDENT_AMBULATORY_CARE_PROVIDER_SITE_OTHER): Payer: Medicare Other | Admitting: Internal Medicine

## 2018-08-24 ENCOUNTER — Encounter: Payer: Self-pay | Admitting: Internal Medicine

## 2018-08-24 VITALS — BP 112/80 | HR 66 | Ht 66.0 in | Wt 205.4 lb

## 2018-08-24 DIAGNOSIS — I2510 Atherosclerotic heart disease of native coronary artery without angina pectoris: Secondary | ICD-10-CM

## 2018-08-24 DIAGNOSIS — I255 Ischemic cardiomyopathy: Secondary | ICD-10-CM

## 2018-08-24 DIAGNOSIS — I5022 Chronic systolic (congestive) heart failure: Secondary | ICD-10-CM

## 2018-08-24 DIAGNOSIS — I493 Ventricular premature depolarization: Secondary | ICD-10-CM

## 2018-08-24 DIAGNOSIS — T827XXA Infection and inflammatory reaction due to other cardiac and vascular devices, implants and grafts, initial encounter: Secondary | ICD-10-CM

## 2018-08-24 LAB — COMPREHENSIVE METABOLIC PANEL
ALK PHOS: 52 U/L (ref 38–126)
ALT: 31 U/L (ref 0–44)
ANION GAP: 9 (ref 5–15)
AST: 44 U/L — ABNORMAL HIGH (ref 15–41)
Albumin: 3.7 g/dL (ref 3.5–5.0)
BILIRUBIN TOTAL: 0.7 mg/dL (ref 0.3–1.2)
BUN: 6 mg/dL (ref 6–20)
CALCIUM: 9.1 mg/dL (ref 8.9–10.3)
CO2: 26 mmol/L (ref 22–32)
Chloride: 104 mmol/L (ref 98–111)
Creatinine, Ser: 0.85 mg/dL (ref 0.44–1.00)
GFR calc Af Amer: >60 mL/min (ref >60)
GFR calc non Af Amer: >60 mL/min (ref >60)
Glucose, Bld: 93 mg/dL (ref 70–99)
POTASSIUM: 4.3 mmol/L (ref 3.5–5.1)
Sodium: 139 mmol/L (ref 135–145)
TOTAL PROTEIN: 8 g/dL (ref 6.5–8.1)

## 2018-08-24 LAB — TSH: TSH: 3.293 u[IU]/mL (ref 0.350–4.500)

## 2018-08-24 NOTE — Progress Notes (Signed)
Electrophysiology Office Note   Date:  08/24/2018   ID:  [name], [name] 03-19-1966, MRN [id number]  PCP:  Fayrene Helper, MD  Cardiologist:   Primary Electrophysiologist:    No chief complaint on file.    History of Present Illness: [name] is a 53 y.o. female  Seen in followup for  s/p ICD implanted for primary prevention and 10/14 underwent generator replacement with revision of her 6949-lead.    DATE TEST    10/17 Echo    EF Severe depression      8/18 Echo   EF 20-25 % LAE(43/2.01/42)  11/18 Myoview EF 13% Large scar w/o ischemia  11/19 LHC  RCA ISR//LM 65%     11/18--  episodes of ventricular tachycardia that were sustained below her detection rate. Monitoring function was activated 5/19 VT/VF syncope ( K 2.5 and Mg 1.6) cx by hyperkalemia requiring Rx   She was referred to the heart failure clinic 12/18.  uptitration of meds limited by hypotension    Appropriate Therapy yes ATP 2018 Shock 5/19 Inappropriate Therapy    Antiarrhythmics Date Reason stopped  mexiletine 2018 intolerance  Amiodarone 2018 Allergy swelling     Date Cr K TSH LFTs Mg Dig  8/18  0.72 4.1   1.8    11/18  0.72 4.1   2.1   1/19 0.74 3.9      8/19 0.84 4.3   2.3    Admitted 11/19 with chest pain; catheterization demonstrated RCA in-stent restenosis and left main disease.  Also noted to have frequent and complex ventricular ectopy; the question was whether this could be contributing to her cardiomyopathy.  After much discussion with heart failure; it was elected to try her on amiodarone as it was not at all clear that amiodarone was the cause of her swelling.>> resulted in significant ventricular suppression.  She is much improved with amiodarone;  Less sob  More energy  No edema  No chest pain   Patient denies symptoms of GI intolerance, sun sensitivity, neurological symptoms attributable to amiodarone.  Surveillance laboratories will be checked today        Past Medical History:  Diagnosis Date  . Arthritis of knee   . Automatic implantable cardiac defibrillator in situ    a. s/p prior Medtronic ICD with 6949 lead. b. s/p Gen change & lead revision 05/2013.  . Cardiomyopathy secondary    Patient has cardiomyopathy out of proportion to her ischemic heart disease  . Chronic pain   . coronary artery disease    RCA stenting Myoview 2011 EF 30% infarction dilatation without ischemia  . Coronary artery disease   . Depression   . Diabetes mellitus   . GERD (gastroesophageal reflux disease)   . Insomnia   . Lupus (systemic lupus erythematosus) ([place])   . Membranous glomerulonephritis   . Migraines   . Myocardial infarction ([place])    8 total   . Nicotine abuse   . Other and unspecified hyperlipidemia   . Paroxysmal VT ([place])   . Systolic CHF ([place])   . VF (ventricular fibrillation) ([place])    a. Hx appropriate ICD therapy for VF.    Current Outpatient Medications  Medication Sig Dispense Refill  . albuterol (PROVENTIL HFA;VENTOLIN HFA) 108 (90 Base) MCG/ACT inhaler Inhale 2 puffs into the lungs every 6 (six) hours as needed for wheezing or shortness of breath. 1 Inhaler 0  . ALPRAZolam (XANAX) 1 MG tablet Take 1 tablet (1 mg  total) by mouth at bedtime. 30 tablet 4  . amiodarone (PACERONE) 200 MG tablet Take 1 tablet (200 mg total) by mouth 2 (two) times daily. 30 tablet 6  . aspirin 81 MG tablet Take 1 tablet (81 mg total) by mouth daily. 30 tablet 6  . atorvastatin (LIPITOR) 10 MG tablet Take 1 tablet (10 mg total) by mouth daily. 90 tablet 2  . carvedilol (COREG) 3.125 MG tablet Take 1 tablet (3.125 mg total) by mouth 2 (two) times daily with a meal. 60 tablet 3  . cholecalciferol (VITAMIN D) 1000 units tablet Take 1,000 Units by mouth daily.    . cyclobenzaprine (FLEXERIL) 5 MG tablet TAKE 1 TABLET BY MOUTH DAILY AT BEDTIME. 30 tablet 0  . digoxin (LANOXIN) 0.125 MG tablet Take 1 tablet (0.125 mg total) by mouth every other day. 90  tablet 3  . famotidine (PEPCID) 20 MG tablet Take 1 tablet (20 mg total) by mouth 2 (two) times daily. 60 tablet 0  . fenofibrate (TRICOR) 145 MG tablet Take 1 tablet (145 mg total) by mouth daily. 90 tablet 3  . furosemide (LASIX) 40 MG tablet Take 1 tablet (40 mg total) by mouth daily. 30 tablet 6  . losartan (COZAAR) 25 MG tablet Take 25 mg by mouth daily.    . magnesium oxide (MAG-OX) 400 MG tablet Take 0.5 tablets (200 mg total) by mouth daily. Take 1/2 tab (200 mg total) by mouth once daily. 45 tablet 0  . mexiletine (MEXITIL) 200 MG capsule Take 1 capsule (200 mg total) by mouth 3 (three) times daily. 90 capsule 3  . miconazole (MICOTIN) 200 MG vaginal suppository Place 1 suppository (200 mg total) vaginally at bedtime. 3 suppository 0  . oxyCODONE (ROXICODONE) 15 MG immediate release tablet Take 15 mg by mouth every 8 (eight) hours.     . pantoprazole (PROTONIX) 40 MG tablet Take 1 tablet (40 mg total) by mouth daily. 30 tablet 6  . penicillin v potassium (VEETID) 500 MG tablet Take 1 tablet (500 mg total) by mouth 3 (three) times daily. 21 tablet 0  . potassium chloride (K-DUR,KLOR-CON) 20 MEQ tablet Take 1 tablet (20 mEq total) by mouth daily. 30 tablet 6   No current facility-administered medications for this visit.     Allergies:   Amiodarone; Bee venom; Cortisone; Mexiletine; Plaquenil [hydroxychloroquine]; Robaxin [methocarbamol]; and Tape   Social History:  The patient  reports that she quit smoking about 3 months ago. Her smoking use included cigarettes. She has a 7.50 pack-year smoking history. She has never used smokeless tobacco. She reports current alcohol use. She reports that she does not use drugs.   Family History:  The patient's family history includes Cancer in her mother; Dementia in her father; Diabetes in her brother, mother, and sister; HIV in her sister; Heart disease in her brother and mother; Hepatitis (age of onset: 40) in her mother; Hyperlipidemia in her  brother, mother, and sister; Liver cancer (age of onset: 36) in her mother; Stroke (age of onset: 53) in her sister.    ROS:  Please see the history of present illness.   Positive for ,   All other systems are reviewed and negative.    PHYSICAL EXAM: VS:  BP 112/80   Pulse 66   Ht 5\' 6"  (1.676 m)   Wt 205 lb 6.4 oz (93.2 kg)   SpO2 93%   BMI 33.15 kg/m  , BMI Body mass index is 33.15 kg/m. Well developed and nourished  in no acute distress HENT normal Neck supple with JVP-flat Clear Regular rate and rhythm, no murmurs or gallops Abd-soft with active BS No Clubbing cyanosis edema Skin-warm and dry A & Oriented  Grossly normal sensory and motor function    EKG: sinus @ 66 18/14/45 LVH w repol  Recent Labs: 07/05/2018: ALT 21; Hemoglobin 11.9; Platelets 362; TSH 4.440 07/28/2018: B Natriuretic Peptide 138.9 08/05/2018: BUN 15; Creatinine, Ser 1.32; Magnesium 2.1; Potassium 4.1; Sodium 136  06/28/2018: Cholesterol 92; HDL 23; LDL Cholesterol 49; Total CHOL/HDL Ratio 4.0; Triglycerides 102; VLDL 20     Estimated Creatinine Clearance: 56.1 mL/min (A) (by C-G formula based on SCr of 1.32 mg/dL (H)).   Wt Readings from Last 3 Encounters:  08/24/18 205 lb 6.4 oz (93.2 kg)  07/28/18 205 lb 12.8 oz (93.4 kg)  07/22/18 202 lb 9.6 oz (91.9 kg)         ASSESSMENT AND PLAN:  Ischemic/nonischemic cardiomyopathy  Implantable defibrillator-Medtronic S./P. revision   Ventricular tachycardia recurrent  Congestive heart failure-chronic-systolic Euvolemic continue current meds  PVCs almost completely suppressed  Tolerating amio Will check surveillance labs  If PVC count remains suppressed at 3 months, will decrease mex tid>>bid  We spent more than 50% of our >25 min visit in face to face counseling regarding the above      Signed, Virl Axe   08/24/2018 2:23 PM      [address] [place] [address]  [phone]  (office) [phone] (fax)

## 2018-08-24 NOTE — Patient Instructions (Addendum)
Medication Instructions:   Decrease your Amiodarone to 200mg , one tablet, once daily  Labwork:  We would like you to have a CMET and TSH drawn at the Heart Failure Clinic  Testing/Procedures: None ordered.  Follow-Up: Your physician recommends that you schedule a follow-up appointment in:   3 months with Chanetta Marshall, NP  Remote monitoring is used to monitor your Pacemaker from home. This monitoring reduces the number of office visits required to check your device to one time per year. It allows [place] to keep an eye on the functioning of your device to ensure it is working properly. You are scheduled for a device check from home on 10/25/2018. You may send your transmission at any time that day. If you have a wireless device, the transmission will be sent automatically. After your physician reviews your transmission, you will receive a postcard with your next transmission date.    Any Other Special Instructions Will Be Listed Below (If Applicable).     If you need a refill on your cardiac medications before your next appointment, please call your pharmacy.

## 2018-08-25 LAB — CUP PACEART INCLINIC DEVICE CHECK
Battery Remaining Longevity: 83 mo
HIGH POWER IMPEDANCE MEASURED VALUE: 78 Ohm
Implantable Lead Implant Date: 20141031
Implantable Lead Location: 753860
Implantable Pulse Generator Implant Date: 20141031
Lead Channel Pacing Threshold Amplitude: 0.625 V
Lead Channel Pacing Threshold Pulse Width: 0.4 ms
Lead Channel Sensing Intrinsic Amplitude: 7 mV
Lead Channel Setting Pacing Pulse Width: 0.4 ms
Lead Channel Setting Sensing Sensitivity: 0.3 mV
MDC IDC MSMT BATTERY VOLTAGE: 3 V
MDC IDC MSMT LEADCHNL RV IMPEDANCE VALUE: 456 Ohm
MDC IDC MSMT LEADCHNL RV IMPEDANCE VALUE: 513 Ohm
MDC IDC MSMT LEADCHNL RV SENSING INTR AMPL: 8.25 mV
MDC IDC SESS DTM: 20200107193841
MDC IDC SET LEADCHNL RV PACING AMPLITUDE: 2.5 V
MDC IDC STAT BRADY RV PERCENT PACED: 0.02 %

## 2018-08-26 ENCOUNTER — Encounter: Payer: Medicare Other | Admitting: Internal Medicine

## 2018-08-26 ENCOUNTER — Other Ambulatory Visit: Payer: Self-pay | Admitting: *Deleted

## 2018-08-26 NOTE — Patient Outreach (Signed)
Outreach to pt for telephonic assessment/ 2nd attempt, no answer to telephone and no option to leave voicemail.  PLAN Outreach in 3-4 business days  Jacqlyn Larsen [facility], [place] [phone]

## 2018-08-27 ENCOUNTER — Telehealth: Payer: Self-pay

## 2018-08-27 DIAGNOSIS — R748 Abnormal levels of other serum enzymes: Secondary | ICD-10-CM

## 2018-08-27 NOTE — Telephone Encounter (Signed)
Pt aware of lab results and recommendations. She will be in the office on Feb 11 for a redraw LFT.

## 2018-08-27 NOTE — Telephone Encounter (Signed)
-----   Message from Deboraha Sprang, MD sent at 08/26/2018  9:47 PM EST ----- Please inform patient that drug surveillance labs are normal x borderline elevated LFT  Lets please recheck in 4 weeks

## 2018-08-31 ENCOUNTER — Other Ambulatory Visit: Payer: Self-pay | Admitting: Family Medicine

## 2018-08-31 ENCOUNTER — Other Ambulatory Visit: Payer: Self-pay | Admitting: *Deleted

## 2018-08-31 NOTE — Patient Outreach (Signed)
Third attempt outreach call to pt for telephonic assessment, spoke with pt, HIPAA verified, pt reports she weighs when she can remember it and feels like it, weight today 205 pounds, pt reports appetite is good, no falls, reports checks BP with wrist cuff " and all readings are good".  Pt states she is attending all follow up appointments, reviewed plan of care and will outreach pt next month after she follows up with HF clinic.  THN CM Care Plan Problem One     Most Recent Value  Care Plan Problem One  Knowledge deficit related to CHF  Role Documenting the Problem One  Care Management Coordinator  Care Plan for Problem One  Active  THN Long Term Goal   Pt will show improvement in self care for CHF within 60 days  THN Long Term Goal Start Date  07/12/18  Interventions for Problem One Long Term Goal  RnCM reviewed plan of care wtih pt, reviewed upcoming appointments 1/17 Echo and HF clinic, 2/11 bloodwork and will follow up with primary MD in April, pt reports she has medications and taking as prescribed.  THN CM Short Term Goal #1   pt will weigh daily and record within 30 days  THN CM Short Term Goal #1 Start Date  08/31/18 [goal re-established]  Interventions for Short Term Goal #1  pt is weighing "some days" but not every day, RNCM reviewed importance of daily weights and ask pt to work on weighing daily and recording  THN CM Short Term Goal #2   Pt will verbalize CHF action plan/ zones within 30 days  THN CM Short Term Goal #2 Start Date  08/31/18 Barrie Folk re-established]  Interventions for Short Term Goal #2  Rn CM reinforced HF action plan with emphasis on yellow zone and knowing how you feel each day     PLAN Outreach pt next month for telephonic assessment  Jacqlyn Larsen [facility], [place] Coordinator [phone]

## 2018-09-08 LAB — CUP PACEART REMOTE DEVICE CHECK
Battery Remaining Longevity: 81 mo
Brady Statistic RV Percent Paced: 0.03 %
HighPow Impedance: 84 Ohm
Implantable Lead Implant Date: 20141031
Implantable Lead Model: 6935
Implantable Pulse Generator Implant Date: 20141031
Lead Channel Pacing Threshold Amplitude: 0.625 V
Lead Channel Pacing Threshold Pulse Width: 0.4 ms
Lead Channel Setting Pacing Amplitude: 2.5 V
Lead Channel Setting Pacing Pulse Width: 0.4 ms
Lead Channel Setting Sensing Sensitivity: 0.3 mV
MDC IDC LEAD LOCATION: 753860
MDC IDC MSMT BATTERY VOLTAGE: 3 V
MDC IDC MSMT LEADCHNL RV IMPEDANCE VALUE: 513 Ohm
MDC IDC MSMT LEADCHNL RV IMPEDANCE VALUE: 551 Ohm
MDC IDC MSMT LEADCHNL RV SENSING INTR AMPL: 5.75 mV
MDC IDC MSMT LEADCHNL RV SENSING INTR AMPL: 5.75 mV
MDC IDC SESS DTM: 20191209094223

## 2018-09-16 ENCOUNTER — Telehealth: Payer: Self-pay | Admitting: *Deleted

## 2018-09-16 ENCOUNTER — Other Ambulatory Visit: Payer: Self-pay | Admitting: Family Medicine

## 2018-09-16 MED ORDER — FLUCONAZOLE 150 MG PO TABS: ORAL_TABLET | ORAL | 0 refills | Status: DC

## 2018-09-16 NOTE — Telephone Encounter (Signed)
Pt called in stated she had taken a suppository recently for yeast infection. She had finished the antibiotic and she is no better wanted to know if Dr. Moshe Cipro could call in something different because she is no better.

## 2018-09-16 NOTE — Telephone Encounter (Signed)
Spoke with patient, she states that she is still having a discharge from the yeast infection that she got from the antibiotics you prescribed on 07-20-18. Used the suppositories and still no better. Please advise

## 2018-09-16 NOTE — Telephone Encounter (Signed)
Called patient she is aware.

## 2018-09-16 NOTE — Progress Notes (Signed)
Fluconazole

## 2018-09-16 NOTE — Telephone Encounter (Signed)
Fluconazole x 2 days is prescribed, pls let her know

## 2018-09-23 ENCOUNTER — Other Ambulatory Visit (HOSPITAL_COMMUNITY)
Admission: RE | Admit: 2018-09-23 | Discharge: 2018-09-23 | Disposition: A | Discharge status: Home / Self Care | Payer: Medicare Other | Source: Ambulatory Visit | Attending: Family Medicine | Admitting: Family Medicine

## 2018-09-23 ENCOUNTER — Ambulatory Visit (INDEPENDENT_AMBULATORY_CARE_PROVIDER_SITE_OTHER): Payer: Medicare Other | Admitting: Family Medicine

## 2018-09-23 ENCOUNTER — Ambulatory Visit: Payer: Medicare Other | Admitting: Family Medicine

## 2018-09-23 ENCOUNTER — Encounter: Payer: Self-pay | Admitting: Family Medicine

## 2018-09-23 VITALS — BP 118/72 | HR 73 | Resp 11 | Ht 66.0 in | Wt 198.0 lb

## 2018-09-23 DIAGNOSIS — E66811 Obesity, class 1: Secondary | ICD-10-CM

## 2018-09-23 DIAGNOSIS — N760 Acute vaginitis: Secondary | ICD-10-CM | POA: Diagnosis not present

## 2018-09-23 DIAGNOSIS — E669 Obesity, unspecified: Secondary | ICD-10-CM

## 2018-09-23 DIAGNOSIS — I10 Essential (primary) hypertension: Secondary | ICD-10-CM

## 2018-09-23 DIAGNOSIS — E118 Type 2 diabetes mellitus with unspecified complications: Secondary | ICD-10-CM

## 2018-09-23 DIAGNOSIS — Z23 Encounter for immunization: Secondary | ICD-10-CM

## 2018-09-23 MED ORDER — CYCLOBENZAPRINE HCL 5 MG PO TABS: 5.0000 mg | ORAL_TABLET | Freq: Every day | ORAL | 0 refills | Status: DC

## 2018-09-23 NOTE — Progress Notes (Signed)
[name]     MRN: [id number]      DOB: 25-Jan-1966   HPI [name] is here for follow up and re-evaluation of chronic medical conditions, medication management and review of any available recent lab and radiology data.  Preventive health is updated, specifically  Cancer screening and Immunization.   2 day h/o malodorous vaginal g/c which is clear, and needs her flu vaccine ROS Denies recent fever or chills. Denies sinus pressure, nasal congestion, ear pain or sore throat. Denies chest congestion, productive cough or wheezing. Denies chest pains, palpitations and leg swelling Denies abdominal pain, nausea, vomiting,diarrhea or constipation.   Denies dysuria, frequency, hesitancy or incontinence. Denies joint pain, swelling and limitation in mobility. Denies headaches, seizures, numbness, or tingling. Denies depression, anxiety or insomnia. Denies skin break down or rash.   PE  BP 118/72   Pulse 73   Resp 11   Ht 5\' 6"  (1.676 m)   Wt 198 lb (89.8 kg)   SpO2 92% Comment: room air  BMI 31.96 kg/m   Patient alert and oriented and in no cardiopulmonary distress.  HEENT: No facial asymmetry, EOMI,   oropharynx pink and moist.  Neck supple no JVD, no mass.  Chest: Clear to auscultation bilaterally.  CVS: S1, S2 no murmurs, no S3.Regular rate.  ABD: Soft non tender.  Pelvic: malodorous vaginal discharge, no ulceration seen, uterus absent , no adnexal masses  Ext: No edema  MS: Adequate ROM spine, shoulders, hips and knees.  Skin: Intact, no ulcerations or rash noted.  Psych: Good eye contact, normal affect. Memory intact not anxious or depressed appearing.  CNS: CN 2-12 intact, power,  normal throughout.no focal deficits noted.   Assessment & Plan Vaginitis Malodorous vaginal discharge, will treat based on culture report  HTN, goal below 130/80 Controlled, no change in medication DASH diet and commitment to daily physical activity for a minimum of  30 minutes discussed and encouraged, as a part of hypertension management. The importance of attaining a healthy weight is also discussed.  BP/Weight 10/06/2018 09/23/2018 08/24/2018 07/28/2018 07/22/2018 07/20/2018 10/21/4654  Systolic BP 812 751 700 174 944 90 967  Diastolic BP 62 72 80 76 64 60 58  Wt. (Lbs) 206 198 205.4 205.8 202.6 204 204  BMI 33.25 31.96 33.15 33.22 32.7 32.93 32.93       Diabetes mellitus type 2 with complications [facility]) [name] is reminded of the importance of commitment to daily physical activity for 30 minutes or more, as able and the need to limit carbohydrate intake to 30 to 60 grams per meal to help with blood sugar control.   The need to take medication as prescribed, test blood sugar as directed, and to call between visits if there is a concern that blood sugar is uncontrolled is also discussed.   [name] is reminded of the importance of daily foot exam, annual eye examination, and good blood sugar, blood pressure and cholesterol control.  Diabetic Labs Latest Ref Rng & Units 08/24/2018 08/05/2018 07/28/2018 07/22/2018 07/09/2018  HbA1c <5.7 % of total Hgb - - - - -  Microalbumin Not estab mg/dL - - - - -  Micro/Creat Ratio <30 mcg/mg creat - - - - -  Chol 0 - 200 mg/dL - - - - -  HDL >40 mg/dL - - - - -  Calc LDL 0 - 99 mg/dL - - - - -  Triglycerides <150 mg/dL - - - - -  Creatinine 0.44 -  1.00 mg/dL 0.85 1.32(H) 0.82 0.92 1.19(H)  GFR >60.00 mL/min - - - - -   BP/Weight 10/06/2018 09/23/2018 08/24/2018 07/28/2018 07/22/2018 07/20/2018 39/12/3200  Systolic BP 334 356 861 683 729 90 021  Diastolic BP 62 72 80 76 64 60 58  Wt. (Lbs) 206 198 205.4 205.8 202.6 204 204  BMI 33.25 31.96 33.15 33.22 32.7 32.93 32.93   Foot/eye exam completion dates Latest Ref Rng & Units 11/04/2017 09/24/2016  Eye Exam No Retinopathy - -  Foot Form Completion - Done Done        Obesity (BMI 30.0-34.9) Deteriorated.  Patient re-educated about  the importance of commitment  to a  minimum of 150 minutes of exercise per week as able.  The importance of healthy food choices with portion control discussed, as well as eating regularly and within a 12 hour window most days. The need to choose "clean , green" food 50 to 75% of the time is discussed, as well as to make water the primary drink and set a goal of 64 ounces water daily.  Encouraged to start a food diary,  and to consider  joining a support group. Sample diet sheets offered. Goals set by the patient for the next several months.   Weight /BMI 10/06/2018 09/23/2018 08/24/2018  WEIGHT 206 lb 198 lb 205 lb 6.4 oz  HEIGHT - 5\' 6"  5\' 6"   BMI 33.25 kg/m2 31.96 kg/m2 33.15 kg/m2      Need for immunization against influenza After obtaining informed consent, the vaccine is  administered , with no adverse effect noted at the time of administration.

## 2018-09-23 NOTE — Patient Instructions (Signed)
F/u as before call if you need me sooner  Specimens are sent for testing for infection  We will contact you with result   Flu vaccine today

## 2018-09-24 ENCOUNTER — Other Ambulatory Visit (HOSPITAL_COMMUNITY): Payer: Self-pay | Admitting: Unknown Physician Specialty

## 2018-09-24 MED ORDER — FUROSEMIDE 40 MG PO TABS: 80.0000 mg | ORAL_TABLET | Freq: Every day | ORAL | 6 refills | Status: AC

## 2018-09-24 NOTE — Telephone Encounter (Signed)
Pt called the VAD clinic stating that the device clinic has instructed her multiple times to take more extra Lasix based on her device reading. Pt states that she only has 1 more Lasix pill and the pharmacy will not refill because it is not time. I have provided her with a new script for Lasix 40 mg daily along with 40 mg extra when instructed by heart team. Pt was instructed that if she continues to have issues with fluid she needs to call the heart failure clinic and possibly been seen earlier.   Tanda Rockers RN, BSN VAD Coordinator 24/7 Pager [phone]

## 2018-09-26 LAB — CERVICOVAGINAL ANCILLARY ONLY
Bacterial vaginitis: NEGATIVE
CANDIDA VAGINITIS: NEGATIVE
Chlamydia: NEGATIVE
Neisseria Gonorrhea: NEGATIVE
Trichomonas: POSITIVE — AB

## 2018-09-27 ENCOUNTER — Other Ambulatory Visit: Payer: Self-pay | Admitting: Family Medicine

## 2018-09-27 ENCOUNTER — Other Ambulatory Visit: Payer: Self-pay | Admitting: *Deleted

## 2018-09-27 ENCOUNTER — Ambulatory Visit (INDEPENDENT_AMBULATORY_CARE_PROVIDER_SITE_OTHER): Payer: Medicare Other

## 2018-09-27 DIAGNOSIS — I5022 Chronic systolic (congestive) heart failure: Secondary | ICD-10-CM

## 2018-09-27 DIAGNOSIS — T827XXA Infection and inflammatory reaction due to other cardiac and vascular devices, implants and grafts, initial encounter: Secondary | ICD-10-CM | POA: Diagnosis not present

## 2018-09-27 MED ORDER — METRONIDAZOLE 500 MG PO TABS: ORAL_TABLET | ORAL | 0 refills | Status: DC

## 2018-09-27 NOTE — Patient Outreach (Signed)
Outreach call to pt for telephone assessment, spoke with pt, HIPAA verified, pt seems in a hurry and states " all is ok"  Weight 198.5 pounds, BP " ok" per pt, does not give any readings, states has all medications and taking as prescribed, to have bloodwork 2/11 and see MD at HF clinic 2/17 per pt,  RN CM will outreach pt after these dates for further follow up.  PLAN Outreach pt this month for telephone assessment Call after 10/04/18  Jacqlyn Larsen [facility], [place] Coordinator [phone]

## 2018-09-27 NOTE — Progress Notes (Signed)
EPIC Encounter for ICM Monitoring  Patient Name: [name] is a 53 y.o. female Date: 09/27/2018 Primary Care Physican: Fayrene Helper, MD Primary Cardiologist: Klein/Bensimhon Electrophysiologist: Caryl Comes Last Weight: 198lbs Today's Weight: unknown                                                    Attempted call to patient and unable to reach.  Left detailed message per DPR regarding transmission. Transmission reviewed.    Thoracic impedance abnormal since 08/15/2019 with exception of 1 day at baseline which new Furosemide script for 09/24/2018 was increased x 30 days.  Prescribed: Furosemide 40 mg Take 2 tablets (80 mg total) by mouth daily for 30 days (starting 09/24/2018). Take 40 mg daily along with 40 mg extra on days you have more fluid  Labs: 09/03/2018 Creatinine 0.85, BUN 6,   Potassium 4.3, Sodium 139, GFR >60 08/05/2018 Creatinine 1.32, BUN 15, Potassium 4.1, Sodium 136, GFR 46-53  07/28/2018 Creatinine 0.82, BUN 10, Potassium 3.2, Sodium 140, GFR >60  07/22/2018 Creatinine 0.82, BUN 10, Potassium 3.2, Sodium 140, GFR >60  07/22/2018 Creatinine 0.92, BUN 13, Potassium 3.9, Sodium 139, GFR >60  07/09/2018 Creatinine 1.19, BUN 13, Potassium 4.4, Sodium 136, GFR 51-59  07/08/2018 Creatinine 1.12, BUN 11, Potassium 4.4, Sodium 137, GFR 55->60  A complete set of results can be found in Results Review.  Recommendations:  Left voice mail with ICM number and encouraged to call if experiencing any fluid symptoms.  Follow-up plan: ICM clinic phone appointment on 10/13/2018 since she will have Optivol checked at office visit with Dr Haroldine Laws scheduled for 10/04/2018.  Copy of ICM check sent to Dr. Caryl Comes and Dr Haroldine Laws.   3 month ICM trend: 09/27/2018    1 Year ICM trend:       [name], RN 09/27/2018 10:38 AM

## 2018-09-28 ENCOUNTER — Other Ambulatory Visit: Payer: Medicare Other

## 2018-09-28 ENCOUNTER — Telehealth: Payer: Self-pay

## 2018-09-28 DIAGNOSIS — R748 Abnormal levels of other serum enzymes: Secondary | ICD-10-CM | POA: Diagnosis not present

## 2018-09-28 NOTE — Telephone Encounter (Signed)
Remote ICM transmission received.  Attempted call to patient regarding ICM remote transmission and left detailed message, per DPR, with next ICM remote transmission date of 10/13/2018.  Advised to return call for any fluid symptoms or questions.

## 2018-09-29 LAB — HEPATIC FUNCTION PANEL
ALT: 27 IU/L (ref 0–32)
AST: 41 IU/L — ABNORMAL HIGH (ref 0–40)
Albumin: 3.8 g/dL (ref 3.8–4.9)
Alkaline Phosphatase: 72 IU/L (ref 39–117)
Bilirubin Total: 0.4 mg/dL (ref 0.0–1.2)
Bilirubin, Direct: 0.15 mg/dL (ref 0.00–0.40)
TOTAL PROTEIN: 7.5 g/dL (ref 6.0–8.5)

## 2018-10-04 ENCOUNTER — Ambulatory Visit (HOSPITAL_COMMUNITY): Admission: RE | Admit: 2018-10-04 | Payer: Medicare Other | Source: Ambulatory Visit

## 2018-10-04 ENCOUNTER — Encounter (HOSPITAL_COMMUNITY): Payer: Medicare Other | Admitting: Internal Medicine

## 2018-10-05 ENCOUNTER — Other Ambulatory Visit (HOSPITAL_COMMUNITY): Payer: Self-pay

## 2018-10-05 ENCOUNTER — Ambulatory Visit (HOSPITAL_COMMUNITY)
Admission: RE | Admit: 2018-10-05 | Discharge: 2018-10-05 | Disposition: A | Discharge status: Home / Self Care | Payer: Medicare Other | Source: Ambulatory Visit | Attending: Student | Admitting: Student

## 2018-10-05 DIAGNOSIS — I429 Cardiomyopathy, unspecified: Secondary | ICD-10-CM | POA: Diagnosis not present

## 2018-10-05 DIAGNOSIS — I252 Old myocardial infarction: Secondary | ICD-10-CM | POA: Diagnosis not present

## 2018-10-05 DIAGNOSIS — E119 Type 2 diabetes mellitus without complications: Secondary | ICD-10-CM | POA: Diagnosis not present

## 2018-10-05 DIAGNOSIS — I340 Nonrheumatic mitral (valve) insufficiency: Secondary | ICD-10-CM | POA: Insufficient documentation

## 2018-10-05 DIAGNOSIS — K219 Gastro-esophageal reflux disease without esophagitis: Secondary | ICD-10-CM | POA: Insufficient documentation

## 2018-10-05 DIAGNOSIS — I2510 Atherosclerotic heart disease of native coronary artery without angina pectoris: Secondary | ICD-10-CM | POA: Diagnosis not present

## 2018-10-05 DIAGNOSIS — I5022 Chronic systolic (congestive) heart failure: Secondary | ICD-10-CM | POA: Diagnosis not present

## 2018-10-05 DIAGNOSIS — I4901 Ventricular fibrillation: Secondary | ICD-10-CM | POA: Insufficient documentation

## 2018-10-05 MED ORDER — AMIODARONE HCL 200 MG PO TABS: 200.0000 mg | ORAL_TABLET | Freq: Two times a day (BID) | ORAL | 6 refills | Status: DC

## 2018-10-05 MED FILL — PACERONE 200 MG TABLET: 200 | 30 days supply | Qty: 60 | Fill #0

## 2018-10-05 NOTE — Progress Notes (Signed)
  Echocardiogram 2D Echocardiogram has been performed.  Darlina Sicilian M 10/05/2018, 1:44 PM

## 2018-10-05 NOTE — Telephone Encounter (Signed)
error 

## 2018-10-06 ENCOUNTER — Other Ambulatory Visit: Payer: Self-pay | Admitting: *Deleted

## 2018-10-06 ENCOUNTER — Ambulatory Visit (HOSPITAL_COMMUNITY)
Admission: RE | Admit: 2018-10-06 | Discharge: 2018-10-06 | Disposition: A | Discharge status: Home / Self Care | Payer: Medicare Other | Source: Ambulatory Visit | Attending: Internal Medicine | Admitting: Internal Medicine

## 2018-10-06 ENCOUNTER — Encounter (HOSPITAL_COMMUNITY): Payer: Self-pay | Admitting: Internal Medicine

## 2018-10-06 ENCOUNTER — Other Ambulatory Visit: Payer: Self-pay

## 2018-10-06 VITALS — BP 113/62 | HR 67 | Wt 206.0 lb

## 2018-10-06 DIAGNOSIS — Z8249 Family history of ischemic heart disease and other diseases of the circulatory system: Secondary | ICD-10-CM | POA: Diagnosis not present

## 2018-10-06 DIAGNOSIS — Z7982 Long term (current) use of aspirin: Secondary | ICD-10-CM | POA: Diagnosis not present

## 2018-10-06 DIAGNOSIS — I472 Ventricular tachycardia, unspecified: Secondary | ICD-10-CM

## 2018-10-06 DIAGNOSIS — G4700 Insomnia, unspecified: Secondary | ICD-10-CM | POA: Diagnosis not present

## 2018-10-06 DIAGNOSIS — Z794 Long term (current) use of insulin: Secondary | ICD-10-CM | POA: Diagnosis not present

## 2018-10-06 DIAGNOSIS — Z888 Allergy status to other drugs, medicaments and biological substances status: Secondary | ICD-10-CM | POA: Diagnosis not present

## 2018-10-06 DIAGNOSIS — I110 Hypertensive heart disease with heart failure: Secondary | ICD-10-CM | POA: Diagnosis not present

## 2018-10-06 DIAGNOSIS — F329 Major depressive disorder, single episode, unspecified: Secondary | ICD-10-CM | POA: Insufficient documentation

## 2018-10-06 DIAGNOSIS — E785 Hyperlipidemia, unspecified: Secondary | ICD-10-CM | POA: Insufficient documentation

## 2018-10-06 DIAGNOSIS — K219 Gastro-esophageal reflux disease without esophagitis: Secondary | ICD-10-CM | POA: Diagnosis not present

## 2018-10-06 DIAGNOSIS — I493 Ventricular premature depolarization: Secondary | ICD-10-CM | POA: Diagnosis not present

## 2018-10-06 DIAGNOSIS — I2510 Atherosclerotic heart disease of native coronary artery without angina pectoris: Secondary | ICD-10-CM | POA: Insufficient documentation

## 2018-10-06 DIAGNOSIS — Z95810 Presence of automatic (implantable) cardiac defibrillator: Secondary | ICD-10-CM | POA: Diagnosis not present

## 2018-10-06 DIAGNOSIS — M329 Systemic lupus erythematosus, unspecified: Secondary | ICD-10-CM | POA: Insufficient documentation

## 2018-10-06 DIAGNOSIS — E119 Type 2 diabetes mellitus without complications: Secondary | ICD-10-CM | POA: Diagnosis not present

## 2018-10-06 DIAGNOSIS — I252 Old myocardial infarction: Secondary | ICD-10-CM | POA: Insufficient documentation

## 2018-10-06 DIAGNOSIS — I5022 Chronic systolic (congestive) heart failure: Secondary | ICD-10-CM

## 2018-10-06 DIAGNOSIS — Z87891 Personal history of nicotine dependence: Secondary | ICD-10-CM | POA: Insufficient documentation

## 2018-10-06 DIAGNOSIS — I428 Other cardiomyopathies: Secondary | ICD-10-CM | POA: Insufficient documentation

## 2018-10-06 DIAGNOSIS — Z79899 Other long term (current) drug therapy: Secondary | ICD-10-CM | POA: Insufficient documentation

## 2018-10-06 MED ORDER — LOSARTAN POTASSIUM 50 MG PO TABS: 50.0000 mg | ORAL_TABLET | Freq: Every day | ORAL | 6 refills | Status: DC

## 2018-10-06 NOTE — Patient Instructions (Signed)
INCREASE Losartan to 50mg  (1 tab) once a day, this prescription has been sent to your pharmacy.  Your physician recommends that you schedule a follow-up appointment in: 4 weeks.

## 2018-10-06 NOTE — Patient Outreach (Signed)
Outreach call to pt for telephone assessment/ follow up from MD appointment 10/04/18 and bloodwork, no answer to telephone and no option to leave voicemail.  RN CM mailed unsuccessful outreach letter to pt home.  PLAN Outreach pt in 3-4 business days  Jacqlyn Larsen [facility], [place] [phone]

## 2018-10-06 NOTE — Progress Notes (Addendum)
Advanced Heart Failure Clinic Note   Referring Physician: Dr, Caryl Comes Primary Care: Tula Nakayama, MD Primary Cardiologist: Dr. Caryl Comes  HPI: [name] is a 52 y.o. female with h/o CAD s/p RCA stenting in 8563, severe systolic HF due to mixed cardiomyopathy EF , recurrent VT s/p MDT ICD, SLE, GERD and ongoing tobacco use.    EF 8/18: EF 20-25%. RV normal. Dilated LV with moderate MR   Last cath 2004 EF 29% Left main has a distal 30% stenosis. Left anterior descending artery has an ostial 30% stenosis.    Left circumflex gives rise to a very large branching ramus intermedius, two  small marginal branches, and two small posterolateral branches.  The ramus  intermedius gives off two branches.  The more medial branch has a 95%  stenosis at its ostium.  However, this is a small caliber branch being less  than or equal to 1.5 mm in diameter.  The circumflex otherwise has minor  luminal irregularities, but no significant stenoses.  Right coronary artery has a 20% stenosis in the mid vessel.  Just distal to  this there is a stent in the distal portion of the mid vessel which is  widely patent with 0% stenosis within the stent.    CT scan chest: 11/18. No PE. Mild ground glass opacitites. No COPD mentioned  She was admitted 5/9-5/05/2018 for syncope secondary to VT/VF. She was shocked by her ICD. Cardiology was consulted. Her potassium was 2.5 and magnesium was 1.6, so supplements were increased. Volume was stable.  She was admitted 5/17-5/18 with hyperkalemia >7.5 and AKI with creatinine 2.59. Potassium was corrected with kayexelate, albuterol, insulin/D50, and calcium gluconate. At discharge, potassium supplement, lasix, spiro, and ARB were stopped. K 4.2 and creatinine 1.55 on day of discharge.   Admitted to [facility] 07/05/2018 for re trial of IV amiodarone. She tolerate amiodarone without s/s. She was transitioned to amiodarone 400 mg twice a day. She is intolerant generic  amiodarone due to the dye. She was provided with a 3 month supply of amiodarone. Plan to reassess ECHO to see if PVC suppression improves EF.   Admitted 11/18 through 07/09/2018. Admitted for amiodarone challenge and started on IV amiodarone. She then transitioned to PO with no s/s allergy. Tablet noted to be a different color than previous tablet that she reacted to. PVCs were suppressed with only 0-1 PVC/min on telemetry by day of discharge. She was provided a 37-month supply of amiodarone through TOC .   Returns for routine f/u. Doing great. Can do all activities without problems. Goes to the store and can go up steps. Has to take her time but can go up. [place] with her fluid at time. No CP, orthopnea or PND. Saw Dr. Caryl Comes in 1/20/  PVCs and NSVT totally suppressed on amio and mexilitene.   Echo done 10/05/18 read as EF 30-35% I have reviewed personally and feel EF 25%   Medtronic: Personally reviewed. No ICD shocks. NSVT totally suppressed. PVC count 0.8/hr. Optivol up but coming back down. Personally reviewed  RHC 06/29/2018    RA = 5 RV = 34/5 PA = 40/11 (21) PCW = 9 Fick cardiac output/index = 4.3/2.1 PVR = 2.8 WU Ao sat = 93% PA sat = 48%, 56%   LHC 06/24/18  Dist LM lesion is 65% stenosed.  Prox RCA to Mid RCA lesion is 100% stenosed.  Ost Ramus lesion is 60% stenosed.  Non-stenotic Ramus lesion.  Prox LAD lesion is 70% stenosed RHC  Myoview 06/30/17: EF 13% There is a large defect of severe severity present in the basal anteroseptal, basal inferoseptal, basal inferior, mid anteroseptal, mid inferoseptal, mid inferior and apical inferior location.  CPX 08/04/17 Pre-Exercise PFTs  FVC 2.21 (71%)    FEV1 1.78 (72%)     FEV1/FVC 81 (100%)     MVV 69 (69%)  Exercise Time:  6:15  Speed (mph): 2.0    Grade (%): 7.0   RPE: 16 Reason stopped: patient ended test due to being lightheaded (9/10) Additional symptoms: dyspnea (7/10)  Resting HR: 79 Peak HR:  94  (54% age predicted max HR) BP rest: 86/62 BP peak: 114/70 Peak VO2: 10.0 (50% predicted peak VO2) VE/VCO2 slope: 46 OUES: 1.04 Peak RER: 0.91 Ventilatory Threshold: 9.4 (47% predicted or measured peak VO2) Peak RR 44 Peak Ventilation: 38.3 VE/MVV: 56% PETCO2 at peak: 26 O2pulse: 10  (91% predicted O2pulse)  Review of systems complete and found to be negative unless listed in HPI.    Past Medical History:  Diagnosis Date  . Arthritis of knee   . Automatic implantable cardiac defibrillator in situ    a. s/p prior Medtronic ICD with 6949 lead. b. s/p Gen change & lead revision 05/2013.  . Cardiomyopathy secondary    Patient has cardiomyopathy out of proportion to her ischemic heart disease  . Chronic pain   . coronary artery disease    RCA stenting Myoview 2011 EF 30% infarction dilatation without ischemia  . Coronary artery disease   . Depression   . Diabetes mellitus   . GERD (gastroesophageal reflux disease)   . Insomnia   . Lupus (systemic lupus erythematosus) ([place])   . Membranous glomerulonephritis   . Migraines   . Myocardial infarction ([place])    8 total   . Nicotine abuse   . Other and unspecified hyperlipidemia   . Paroxysmal VT ([place])   . Systolic CHF ([place])   . VF (ventricular fibrillation) ([place])    a. Hx appropriate ICD therapy for VF.   Current Outpatient Medications  Medication Sig Dispense Refill  . albuterol (PROVENTIL HFA;VENTOLIN HFA) 108 (90 Base) MCG/ACT inhaler Inhale 2 puffs into the lungs every 6 (six) hours as needed for wheezing or shortness of breath. 1 Inhaler 0  . ALPRAZolam (XANAX) 1 MG tablet TAKE 1 TABLET BY MOUTH AT BEDTIME AS NEEDED FOR ANXIETY. 30 tablet 3  . amiodarone (PACERONE) 200 MG tablet Take 1 tablet (200 mg total) by mouth 2 (two) times daily. 30 tablet 6  . aspirin 81 MG tablet Take 1 tablet (81 mg total) by mouth daily. 30 tablet 6  . atorvastatin (LIPITOR) 10 MG tablet Take 1 tablet (10 mg total) by mouth daily. 90  tablet 2  . carvedilol (COREG) 3.125 MG tablet Take 1 tablet (3.125 mg total) by mouth 2 (two) times daily with a meal. 60 tablet 3  . cholecalciferol (VITAMIN D) 1000 units tablet Take 1,000 Units by mouth daily.    . cyclobenzaprine (FLEXERIL) 5 MG tablet Take 1 tablet (5 mg total) by mouth at bedtime. 30 tablet 0  . digoxin (LANOXIN) 0.125 MG tablet Take 1 tablet (0.125 mg total) by mouth every other day. 90 tablet 3  . famotidine (PEPCID) 20 MG tablet Take 1 tablet (20 mg total) by mouth 2 (two) times daily. 60 tablet 0  . fenofibrate (TRICOR) 145 MG tablet Take 1 tablet (145 mg total) by mouth daily. 90 tablet 3  . furosemide (LASIX) 40 MG tablet Take  2 tablets (80 mg total) by mouth daily for 30 days. Take 40 mg daily along with 40 mg extra on days you have more fluid 60 tablet 6  . losartan (COZAAR) 25 MG tablet Take 25 mg by mouth daily.    . magnesium oxide (MAG-OX) 400 MG tablet Take 0.5 tablets (200 mg total) by mouth daily. Take 1/2 tab (200 mg total) by mouth once daily. 45 tablet 0  . mexiletine (MEXITIL) 200 MG capsule Take 1 capsule (200 mg total) by mouth 3 (three) times daily. 90 capsule 3  . oxyCODONE (ROXICODONE) 15 MG immediate release tablet Take 15 mg by mouth every 8 (eight) hours.     . pantoprazole (PROTONIX) 40 MG tablet Take 1 tablet (40 mg total) by mouth daily. 30 tablet 6  . penicillin v potassium (VEETID) 500 MG tablet Take 1 tablet (500 mg total) by mouth 3 (three) times daily. 21 tablet 0  . potassium chloride (K-DUR,KLOR-CON) 20 MEQ tablet Take 1 tablet (20 mEq total) by mouth daily. 30 tablet 6  . fluconazole (DIFLUCAN) 150 MG tablet Take one tablet once daily for 2 days only (Patient not taking: Reported on 09/23/2018) 2 tablet 0  . metroNIDAZOLE (FLAGYL) 500 MG tablet Take four tablets at  one time, once  only (Patient not taking: Reported on 10/06/2018) 4 tablet 0  . miconazole (MICOTIN) 200 MG vaginal suppository Place 1 suppository (200 mg total) vaginally at  bedtime. (Patient not taking: Reported on 09/23/2018) 3 suppository 0   No current facility-administered medications for this encounter.    Allergies  Allergen Reactions  . Amiodarone Swelling and Other (See Comments)    11/22 - tolerating pacerone this admit Headache and facial swelling; re-trial inpatient 07/07/2018   . Bee Venom Anaphylaxis  . Cortisone Swelling  . Mexiletine Other (See Comments)    (Caused bad heartburn) Dr. Haroldine Laws and Caryl Comes aware and are giving new trial on 06/30/18  . Plaquenil [Hydroxychloroquine] Other (See Comments)    Caused damage to the patient's heart (was being taken to treat Lupus)  . Robaxin [Methocarbamol] Hives and Itching  . Tape Rash and Other (See Comments)    "EATS ME UP"   Social History   Socioeconomic History  . Marital status: Single    Spouse name: Not on file  . Number of children: 2  . Years of education: Not on file  . Highest education level: Master's degree (e.g., MA, MS, MEng, MEd, MSW, MBA)  Occupational History  . Occupation: disabled    Fish farm manager: UNEMPLOYED  Social Needs  . Financial resource strain: Somewhat hard  . Food insecurity:    Worry: Never true    Inability: Never true  . Transportation needs:    Medical: No    Non-medical: No  Tobacco Use  . Smoking status: Former Smoker    Packs/day: 0.50    Years: 15.00    Pack years: 7.50    Types: Cigarettes    Last attempt to quit: 05/17/2018    Years since quitting: 0.3  . Smokeless tobacco: Never Used  Substance and Sexual Activity  . Alcohol use: Yes    Alcohol/week: 0.0 standard drinks    Comment: occasionaly  . Drug use: No  . Sexual activity: Not Currently    Birth control/protection: Surgical  Lifestyle  . Physical activity:    Days per week: 0 days    Minutes per session: 0 min  . Stress: Not at all  Relationships  . Social connections:  Talks on phone: More than three times a week    Gets together: Once a week    Attends religious service:  Never    Active member of club or organization: No    Attends meetings of clubs or organizations: Never    Relationship status: Never married  . Intimate partner violence:    Fear of current or ex partner: No    Emotionally abused: No    Physically abused: No    Forced sexual activity: No  Other Topics Concern  . Not on file  Social History Narrative  . Not on file   Family History  Problem Relation Age of Onset  . Hepatitis Mother 75       HCV  . Liver cancer Mother 55  . Cancer Mother   . Diabetes Mother   . Heart disease Mother   . Hyperlipidemia Mother   . Stroke Sister 45       x3  . Diabetes Sister   . Hyperlipidemia Sister   . Dementia Father   . HIV Sister   . Diabetes Brother   . Heart disease Brother   . Hyperlipidemia Brother    Vitals:   10/06/18 1036  BP: 113/62  Pulse: 67  SpO2: 95%  Weight: 93.4 kg (206 lb)    Wt Readings from Last 3 Encounters:  10/06/18 93.4 kg (206 lb)  09/23/18 89.8 kg (198 lb)  08/24/18 93.2 kg (205 lb 6.4 oz)     PHYSICAL EXAM: General:  Well appearing. No resp difficulty HEENT: normal Neck: supple. JVP 7-8. Carotids 2+ bilat; no bruits. No lymphadenopathy or thryomegaly appreciated. Cor: PMI nondisplaced. Regular rate & rhythm. No rubs, gallops or murmurs. Lungs: clear Abdomen: soft, nontender, nondistended. No hepatosplenomegaly. No bruits or masses. Good bowel sounds. Extremities: no cyanosis, clubbing, rash, edema Neuro: alert & orientedx3, cranial nerves grossly intact. moves all 4 extremities w/o difficulty. Affect pleasant  ASSESSMENT & PLAN:  1. Chronic systolic HF - She has long-standing mixed ischemic and NICM. EF 20-25% by echo 8/18. 13% by Myoview 11/18. S/p MDT ICD - Echo 03/2018 15-20%  -Overall much improved with suppression of ventricular ectopy since 11/19. Suspect may have component of PVC cardiomyopathy - Echo 10/05/18 read as EF 30-35% I have reviewed personally and feel EF 25% - I am hopeful that  her EF may continue to improve.  - Volume status mildly elevated but coming back down. Continue lasix 40 mg daily with extra as needed.   - Increase losartan to 50  mg daily.  - Continue carvedilol 3.125 mg twice a day +  digoxin 0.125 mg daily - Hold off on spiro for now with hyper/hypokalemia.  - CPX 08/04/17 at least moderate limitation from cardiac perspective.  - Plan to repeat ECHO in few months.  - ICD interrogated in clinic personally. No VT. No shock. PVC 0.8/hr. Optivol up but improving.  2. CAD - s/p remote RCA stent.  06/2018 LHC-Dist LM lesion is 65% stenosed.  Prox RCA to Mid RCA lesion is 100% stenosed.  Ost Ramus lesion is 60% stenosed.  Non-stenotic Ramus lesion.  Prox LAD lesion is 70% stenosed. - Continue ASA and statin. - Possible CABG if EF improves with PVC suppression.   - No s/s ischemia  3. HTN - Stable on meds as above.   4. Tobacco use - she stats she is quit    5. VT/Ferequent PVCs - Managed by Dr. Caryl Comes. S/p MDT ICD.  - Tolerating brand  amiodarone + mexiletine - S/p ICD shock 12/2017 in setting of K 2.5, mag 1.6. She can now drive.   6. SLE - Followed [facility] at [facility]. Has been quiescent for many years.  - No change.    7. Recent AKI - Resolved. BMET today with diuretic changes.   8. Palpitations - No palpitations with amiodarone on board. No change.     Glori Bickers, MD  11:29 AM   Greater than 50% of the 25 minute visit was spent in counseling/coordination of care regarding disease state education, salt/fluid restriction, sliding scale diuretics, and medication compliance.

## 2018-10-09 ENCOUNTER — Encounter: Payer: Self-pay | Admitting: Family Medicine

## 2018-10-09 NOTE — Assessment & Plan Note (Signed)
Controlled, no change in medication DASH diet and commitment to daily physical activity for a minimum of 30 minutes discussed and encouraged, as a part of hypertension management. The importance of attaining a healthy weight is also discussed.  BP/Weight 10/06/2018 09/23/2018 08/24/2018 07/28/2018 07/22/2018 07/20/2018 54/10/6065  Systolic BP 703 403 524 818 590 90 931  Diastolic BP 62 72 80 76 64 60 58  Wt. (Lbs) 206 198 205.4 205.8 202.6 204 204  BMI 33.25 31.96 33.15 33.22 32.7 32.93 32.93

## 2018-10-09 NOTE — Assessment & Plan Note (Signed)
After obtaining informed consent, the vaccine is  administered , with no adverse effect noted at the time of administration.  

## 2018-10-09 NOTE — Assessment & Plan Note (Signed)
[name] is reminded of the importance of commitment to daily physical activity for 30 minutes or more, as able and the need to limit carbohydrate intake to 30 to 60 grams per meal to help with blood sugar control.   The need to take medication as prescribed, test blood sugar as directed, and to call between visits if there is a concern that blood sugar is uncontrolled is also discussed.   [name] is reminded of the importance of daily foot exam, annual eye examination, and good blood sugar, blood pressure and cholesterol control.  Diabetic Labs Latest Ref Rng & Units 08/24/2018 08/05/2018 07/28/2018 07/22/2018 07/09/2018  HbA1c <5.7 % of total Hgb - - - - -  Microalbumin Not estab mg/dL - - - - -  Micro/Creat Ratio <30 mcg/mg creat - - - - -  Chol 0 - 200 mg/dL - - - - -  HDL >40 mg/dL - - - - -  Calc LDL 0 - 99 mg/dL - - - - -  Triglycerides <150 mg/dL - - - - -  Creatinine 0.44 - 1.00 mg/dL 0.85 1.32(H) 0.82 0.92 1.19(H)  GFR >60.00 mL/min - - - - -   BP/Weight 10/06/2018 09/23/2018 08/24/2018 07/28/2018 07/22/2018 07/20/2018 37/10/4285  Systolic BP 681 157 262 035 597 90 416  Diastolic BP 62 72 80 76 64 60 58  Wt. (Lbs) 206 198 205.4 205.8 202.6 204 204  BMI 33.25 31.96 33.15 33.22 32.7 32.93 32.93   Foot/eye exam completion dates Latest Ref Rng & Units 11/04/2017 09/24/2016  Eye Exam No Retinopathy - -  Foot Form Completion - Done Done

## 2018-10-09 NOTE — Assessment & Plan Note (Signed)
Malodorous vaginal discharge, will treat based on culture report

## 2018-10-09 NOTE — Assessment & Plan Note (Signed)
Deteriorated.  Patient re-educated about  the importance of commitment to a  minimum of 150 minutes of exercise per week as able.  The importance of healthy food choices with portion control discussed, as well as eating regularly and within a 12 hour window most days. The need to choose "clean , green" food 50 to 75% of the time is discussed, as well as to make water the primary drink and set a goal of 64 ounces water daily.  Encouraged to start a food diary,  and to consider  joining a support group. Sample diet sheets offered. Goals set by the patient for the next several months.   Weight /BMI 10/06/2018 09/23/2018 08/24/2018  WEIGHT 206 lb 198 lb 205 lb 6.4 oz  HEIGHT - 5\' 6"  5\' 6"   BMI 33.25 kg/m2 31.96 kg/m2 33.15 kg/m2

## 2018-10-11 ENCOUNTER — Encounter: Payer: Self-pay | Admitting: *Deleted

## 2018-10-11 ENCOUNTER — Other Ambulatory Visit: Payer: Self-pay | Admitting: *Deleted

## 2018-10-11 NOTE — Patient Outreach (Signed)
Outreach call to pt for follow up telephone assessment, pt states " I did go to HF clinic and had echocardiogram"  Pt reports " I won't be having any operations, just managed with medication"  Pt reports she has all medications and taking as prescribed by MD.  Pt reports she will follow up with HF clinic 11/17/18, patient's daughter continues to assist pt as needed.  RN CM reviewed discharge plan and pt agreeable to telephone assessment next month and then pt will consider transfer to RN health coach.  Pt can benefit from reinforcement, reminders.  THN CM Care Plan Problem One     Most Recent Value  Care Plan Problem One  Knowledge deficit related to CHF  Role Documenting the Problem One  Care Management [place] for Problem One  Active  THN Long Term Goal   Pt will show improvement in self care for CHF within 60 days  THN Long Term Goal Start Date  10/11/18 Barrie Folk re-established]  Interventions for Problem One Long Term Goal  RN CM reviewed plan of care with pt,  reviewed instructions from after visit summary with HF clinic, reviewed changes in dosages/ frequency of medications  THN CM Short Term Goal #1   pt will weigh daily and record within 30 days  THN CM Short Term Goal #1 Start Date  08/31/18 [goal re-established]  THN CM Short Term Goal #1 Met Date  10/11/18  Interventions for Short Term Goal #1  Pt states she is weighing few times weekly, not daily and this is the most she is probably going to weigh,  Weight ranges 198-205 pounds  THN CM Short Term Goal #2   Pt will verbalize CHF action plan/ zones within 30 days  THN CM Short Term Goal #2 Start Date  10/11/18 [goal re-established]  Interventions for Short Term Goal #2  RN CM reviewed HF action plan, reminded pt about calling HF clinic for any change in health condition, symptoms (such as weight gain, edema, etc)      PLAN Outreach pt next month for telephone assessment Assess weight Reinforce HF action plan  Jacqlyn Larsen  [facility], [place] [phone]

## 2018-10-13 ENCOUNTER — Ambulatory Visit (INDEPENDENT_AMBULATORY_CARE_PROVIDER_SITE_OTHER): Payer: Medicare Other

## 2018-10-13 DIAGNOSIS — I5022 Chronic systolic (congestive) heart failure: Secondary | ICD-10-CM

## 2018-10-13 DIAGNOSIS — T827XXA Infection and inflammatory reaction due to other cardiac and vascular devices, implants and grafts, initial encounter: Secondary | ICD-10-CM

## 2018-10-15 NOTE — Progress Notes (Signed)
EPIC Encounter for ICM Monitoring  Patient Name: Makiah Foye is a 53 y.o. female Date: 10/15/2018 Primary Care Physican: Fayrene Helper, MD Primary Cardiologist: Klein/Bensimhon Electrophysiologist: Caryl Comes LastWeight: 198lbs Today's Weight:198 - 205 lbs   Heart failure questions reviewed.  She said she feels much better since the fluid has resolved.  She reports when she has too much fluid she can't sleep at night because she feels like she drowning and hands swell.  She is not currently having any symptoms.  She weighs occasionally but not everyday.   Thoracic impedancereturned to baseline normal2/26/2020. (Impedance was suggesting fluid since 08/15/2019 with exception of 1 day at baseline).  Prescribed:Furosemide40 mg Take 2 tablets (80 mg total) by mouth daily for 30 days (starting 09/24/2018). Take 40 mg daily along with 40 mg extra on days you have more fluid  Labs: 09/03/2018 Creatinine 0.85, BUN 6,   Potassium 4.3, Sodium 139, GFR >60 08/05/2018 Creatinine 1.32, BUN 15, Potassium 4.1, Sodium 136, GFR 46-53  07/28/2018 Creatinine 0.82, BUN 10, Potassium 3.2, Sodium 140, GFR >60  07/22/2018 Creatinine 0.82, BUN 10, Potassium 3.2, Sodium 140, GFR >60  07/22/2018 Creatinine 0.92, BUN 13, Potassium 3.9, Sodium 139, GFR >60  07/09/2018 Creatinine 1.19, BUN 13, Potassium 4.4, Sodium 136, GFR 51-59  07/08/2018 Creatinine 1.12, BUN 11, Potassium 4.4, Sodium 137, GFR 55->60  A complete set of results can be found in Results Review.  Recommendations: No changes and encouraged to call for fluid symptoms.   Follow-up plan: ICM clinic phone appointment on3/23/2020.  Copy of ICM check sent to Dr.Klein and Dr Haroldine Laws.  3 month ICM trend: 10/13/2018    1 Year ICM trend:       [name], RN 10/15/2018 12:46 PM

## 2018-10-22 ENCOUNTER — Other Ambulatory Visit: Payer: Self-pay | Admitting: Family Medicine

## 2018-10-22 DIAGNOSIS — Z23 Encounter for immunization: Secondary | ICD-10-CM

## 2018-10-22 DIAGNOSIS — N760 Acute vaginitis: Secondary | ICD-10-CM

## 2018-10-25 ENCOUNTER — Telehealth: Payer: Self-pay | Admitting: *Deleted

## 2018-10-25 ENCOUNTER — Ambulatory Visit (INDEPENDENT_AMBULATORY_CARE_PROVIDER_SITE_OTHER): Payer: Medicare Other | Admitting: *Deleted

## 2018-10-25 DIAGNOSIS — I5022 Chronic systolic (congestive) heart failure: Secondary | ICD-10-CM | POA: Diagnosis not present

## 2018-10-25 DIAGNOSIS — N760 Acute vaginitis: Secondary | ICD-10-CM

## 2018-10-25 DIAGNOSIS — I255 Ischemic cardiomyopathy: Secondary | ICD-10-CM

## 2018-10-25 NOTE — Telephone Encounter (Signed)
Was treated for trchomonas , this needs to be re tested, she may submit urine

## 2018-10-25 NOTE — Telephone Encounter (Signed)
Pt called stating that she was given 4 pills which was antibiotic and she didn't think about having a yeast infection. She felt like the the yeast was there and now she is having the same problem that she was having when she was saw at the beginning of February. She said that she felt like if she had the four pills and the yeast infection pills she could get over whatever she has going on.

## 2018-10-26 ENCOUNTER — Other Ambulatory Visit (HOSPITAL_COMMUNITY)
Admission: RE | Admit: 2018-10-26 | Discharge: 2018-10-26 | Disposition: A | Discharge status: Home / Self Care | Payer: Medicare Other | Source: Ambulatory Visit | Attending: Family Medicine | Admitting: Family Medicine

## 2018-10-26 DIAGNOSIS — N760 Acute vaginitis: Secondary | ICD-10-CM | POA: Diagnosis not present

## 2018-10-26 NOTE — Telephone Encounter (Signed)
Specimen sent per Abby

## 2018-10-28 ENCOUNTER — Telehealth: Payer: Self-pay | Admitting: Family Medicine

## 2018-10-28 LAB — CUP PACEART REMOTE DEVICE CHECK
Date Time Interrogation Session: 20200312162827
Implantable Lead Implant Date: 20141031
Implantable Lead Location: 753860
Implantable Pulse Generator Implant Date: 20141031

## 2018-10-28 LAB — URINE CYTOLOGY ANCILLARY ONLY: Trichomonas: POSITIVE — AB

## 2018-10-28 NOTE — Telephone Encounter (Signed)
Pt is calling to get an update on the lab she left in the office

## 2018-10-29 ENCOUNTER — Other Ambulatory Visit: Payer: Self-pay | Admitting: Family Medicine

## 2018-10-29 MED ORDER — METRONIDAZOLE 500 MG PO TABS: 500.0000 mg | ORAL_TABLET | Freq: Two times a day (BID) | ORAL | 0 refills | Status: DC

## 2018-10-29 NOTE — Telephone Encounter (Signed)
Gave patient lab results and treatment plan with verbal understanding.

## 2018-10-29 NOTE — Telephone Encounter (Signed)
Patient left another urine sample to be retested for trichomonas. Results are back. Tested positive again.

## 2018-10-29 NOTE — Telephone Encounter (Signed)
I do not have in hand any lab she left , I do not know what she is referring to, please  Get specification from her and try tol locate the result so I can review .

## 2018-10-29 NOTE — Telephone Encounter (Signed)
Thank you,very much, I am re prescribing treatment , and she needs to ensure that a her partner is also treated

## 2018-10-30 ENCOUNTER — Other Ambulatory Visit (HOSPITAL_COMMUNITY): Payer: Self-pay | Admitting: Student

## 2018-11-01 NOTE — Progress Notes (Signed)
Remote ICD transmission.   

## 2018-11-08 ENCOUNTER — Other Ambulatory Visit: Payer: Self-pay

## 2018-11-08 ENCOUNTER — Other Ambulatory Visit: Payer: Self-pay | Admitting: *Deleted

## 2018-11-08 ENCOUNTER — Ambulatory Visit (INDEPENDENT_AMBULATORY_CARE_PROVIDER_SITE_OTHER): Payer: Medicare Other

## 2018-11-08 DIAGNOSIS — T827XXA Infection and inflammatory reaction due to other cardiac and vascular devices, implants and grafts, initial encounter: Secondary | ICD-10-CM

## 2018-11-08 DIAGNOSIS — I5022 Chronic systolic (congestive) heart failure: Secondary | ICD-10-CM | POA: Diagnosis not present

## 2018-11-08 NOTE — Patient Outreach (Signed)
Outreach call to pt for telephonic assessment, spoke with pt, HIPAA verified, pt reports weight 205 pounds, has been attending all scheduled appointments and to follow up with HF clinic 11/17/18, pt reports she is to get medication amiodarone from [facility] today as she took her last pill this morning.  Pt reports she has "checked BP some and it's been good"  Pt does not give any readings.  No new concerns voiced.  RN CM faxed today's note to primary MD Dr. Moshe Cipro and HF clinic.  THN CM Care Plan Problem One     Most Recent Value  Care Plan Problem One  Knowledge deficit related to CHF  Role Documenting the Problem One  Care Management [place] for Problem One  Active  THN Long Term Goal   Pt will show improvement in self care for CHF within 60 days  THN Long Term Goal Start Date  10/11/18 [goal re-established]  Interventions for Problem One Long Term Goal  RN CM reviewed plan of care, reviewed medications, weight log  THN CM Short Term Goal #1   pt will weigh daily and record within 30 days  THN CM Short Term Goal #1 Start Date  08/31/18 [goal re-established]  THN CM Short Term Goal #1 Met Date  10/11/18  THN CM Short Term Goal #2   Pt will verbalize CHF action plan/ zones within 30 days  THN CM Short Term Goal #2 Start Date  11/08/18 Barrie Folk re-established]  Interventions for Short Term Goal #2  RN CM reinforced HF action plan with emphasis on yellow zone     PLAN Call pt tomorrow and make sure she got amiodarone from [facility], [place] [phone]

## 2018-11-09 ENCOUNTER — Other Ambulatory Visit (HOSPITAL_COMMUNITY): Payer: Self-pay | Admitting: Student

## 2018-11-09 ENCOUNTER — Other Ambulatory Visit: Payer: Self-pay | Admitting: *Deleted

## 2018-11-09 NOTE — Patient Outreach (Signed)
Outreach call to pt to follow up on getting medication (amiodarone) from pharmacy, no answer to telephone, left voicemail requesting return phone call, mailed unsuccessful outreach letter to pt home.  PLAN Outreach pt in 3-4 business days  Jacqlyn Larsen [facility], [place] [phone]

## 2018-11-09 NOTE — Progress Notes (Signed)
EPIC Encounter for ICM Monitoring  Patient Name: [name] is a 53 y.o. female Date: 11/09/2018 Primary Care Physican: Fayrene Helper, MD Primary Cardiologist: Klein/Bensimhon Electrophysiologist: Caryl Comes LastWeight: 374MOL 11/09/2018 Weight:205lbs   Heart failure questions reviewed.  Patient is asymptomatic.  She weighs occasionally but not everyday.   Thoracic impedanceabnormalsuggesting fluid since 10/28/2018.  Prescribed:Furosemide40 mgTake 1 tablet (40 mg total) daily along with 40 mg extra on days you have more fluid.    Labs: 09/03/2018 Creatinine0.85, BUN6, Potassium 4.3, Sodium139, GFR>60 12/19/2019Creatinine 1.32, BUN15, Potassium4.1, Sodium136, [id number]  12/11/2019Creatinine 0.82, BUN10, Potassium3.2, Sodium140, GFR>60  12/05/2019Creatinine 0.82, BUN10, Potassium3.2, Sodium140, GFR>60  07/22/2018 Creatinine0.92, BUN13, Potassium3.9, [id number], GFR>60  07/09/2018 Creatinine1.19, BUN13, Potassium4.4, Sodium136, QRF75-88  07/08/2018 Creatinine1.12, BUN11, Potassium4.4, [id number], GFR55->60 A complete set of results can be found in Results Review.  Recommendations:No changes and encouraged to call for fluid symptoms.   Follow-up plan: ICM clinic phone appointment on4/27/2020.  Office appt 11/17/2018 with HF clinic PA/NP.  Copy of ICM check sent to Dr.Kleinand Dr Haroldine Laws.  3 month ICM trend: 11/08/2018    1 Year ICM trend:       [name], RN 11/09/2018 10:04 AM

## 2018-11-10 ENCOUNTER — Other Ambulatory Visit: Payer: Self-pay

## 2018-11-10 MED ORDER — AMIODARONE HCL 200 MG PO TABS: 200.0000 mg | ORAL_TABLET | Freq: Two times a day (BID) | ORAL | 6 refills | Status: DC

## 2018-11-12 ENCOUNTER — Other Ambulatory Visit: Payer: Self-pay | Admitting: *Deleted

## 2018-11-12 ENCOUNTER — Other Ambulatory Visit: Payer: Self-pay

## 2018-11-12 NOTE — Patient Outreach (Signed)
Outreach call to pt for follow up on medication amiodarone, spoke with pt, HIPAA verified, pt reports "[facility] was out of stock so I had to get a new prescription and send to [facility] and I got the medicine yesterday"  Pt states she has all medications now and taking as prescribed,  Pt continues weighing daily and has assistance of her daughter for any needs that arise.  RN CM discussed discharge and will close case today,  RN CM mailed case closure letter to pt home and faxed case closure letter to primary MD.  [facility] CM Care Plan Problem One     Most Recent Value  Care Plan Problem One  Knowledge deficit related to CHF  Role Documenting the Problem One  Care Management [place] for Problem One  Active  [facility] Long Term Goal   Pt will show improvement in self care for CHF within 60 days  [facility] Long Term Goal Start Date  10/11/18 [goal re-established]  [facility]N Long Term Goal Met Date  11/12/18  Interventions for Problem One Long Term Goal  RN CM reviewed plan of care with pt and will discharge from RN care management today,  RN CM offered RN health coaching services and pt adamantly refuses.  [facility] CM Short Term Goal #1   pt will weigh daily and record within 30 days  [facility] CM Short Term Goal #1 Start Date  08/31/18 [goal re-established]  [facility] CM Short Term Goal #1 Met Date  10/11/18  [facility] CM Short Term Goal #2   Pt will verbalize CHF action plan/ zones within 30 days  [facility] CM Short Term Goal #2 Start Date  11/08/18 [goal re-established]  [facility] CM Short Term Goal #2 Met Date  11/12/18  Interventions for Short Term Goal #2  RN CM reviewed HF action plan, pt feels that she and her daughter are familiar with the action plan and know what actions to take      PLAN Close case today  Jacqlyn Larsen [facility], [place] Coordinator [phone]

## 2018-11-17 ENCOUNTER — Encounter (HOSPITAL_COMMUNITY): Payer: Medicare Other

## 2018-11-18 ENCOUNTER — Other Ambulatory Visit: Payer: Self-pay

## 2018-11-18 NOTE — Patient Outreach (Signed)
[place] [facility]) Care Management  11/18/2018  [name] 06-21-1966 [id number]   Medication Adherence call to [name]  HIPPA Compliant Voice message left with a call back number. [name] is showing past due on Losartan  50 mg under [place].   [place] Management Direct Dial [phone]  Fax [phone] [email or web address]

## 2018-11-22 ENCOUNTER — Other Ambulatory Visit: Payer: Self-pay

## 2018-11-22 ENCOUNTER — Other Ambulatory Visit: Payer: Self-pay | Admitting: Family Medicine

## 2018-11-22 ENCOUNTER — Ambulatory Visit (INDEPENDENT_AMBULATORY_CARE_PROVIDER_SITE_OTHER): Payer: Medicare Other | Admitting: Family Medicine

## 2018-11-22 ENCOUNTER — Encounter: Payer: Self-pay | Admitting: Family Medicine

## 2018-11-22 VITALS — BP 115/78 | Ht 66.0 in | Wt 209.0 lb

## 2018-11-22 DIAGNOSIS — J3089 Other allergic rhinitis: Secondary | ICD-10-CM | POA: Diagnosis not present

## 2018-11-22 DIAGNOSIS — E785 Hyperlipidemia, unspecified: Secondary | ICD-10-CM

## 2018-11-22 DIAGNOSIS — R7302 Impaired glucose tolerance (oral): Secondary | ICD-10-CM | POA: Diagnosis not present

## 2018-11-22 DIAGNOSIS — M62838 Other muscle spasm: Secondary | ICD-10-CM | POA: Diagnosis not present

## 2018-11-22 DIAGNOSIS — F5101 Primary insomnia: Secondary | ICD-10-CM

## 2018-11-22 DIAGNOSIS — I10 Essential (primary) hypertension: Secondary | ICD-10-CM

## 2018-11-22 DIAGNOSIS — I5022 Chronic systolic (congestive) heart failure: Secondary | ICD-10-CM

## 2018-11-22 DIAGNOSIS — F17218 Nicotine dependence, cigarettes, with other nicotine-induced disorders: Secondary | ICD-10-CM

## 2018-11-22 DIAGNOSIS — Z1231 Encounter for screening mammogram for malignant neoplasm of breast: Secondary | ICD-10-CM

## 2018-11-22 MED ORDER — ALBUTEROL SULFATE HFA 108 (90 BASE) MCG/ACT IN AERS: 2.0000 | INHALATION_SPRAY | Freq: Four times a day (QID) | RESPIRATORY_TRACT | 5 refills | Status: AC | PRN

## 2018-11-22 MED ORDER — MONTELUKAST SODIUM 10 MG PO TABS: 10.0000 mg | ORAL_TABLET | Freq: Every day | ORAL | 3 refills | Status: DC

## 2018-11-22 MED ORDER — CYCLOBENZAPRINE HCL 5 MG PO TABS: 5.0000 mg | ORAL_TABLET | Freq: Every day | ORAL | 3 refills | Status: DC

## 2018-11-22 MED ORDER — ALPRAZOLAM 1 MG PO TABS: 1.0000 mg | ORAL_TABLET | Freq: Every day | ORAL | 5 refills | Status: DC

## 2018-11-22 NOTE — Assessment & Plan Note (Signed)
Denies symptoms of decompensation

## 2018-11-22 NOTE — Progress Notes (Signed)
Virtual Visit via Telephone Note  I connected with [name] on 11/22/18 at 10:20 AM EDT by telephone and verified that I am speaking with the correct person using two identifiers.   I discussed the limitations, risks, security and privacy concerns of performing an evaluation and management service by telephone and the availability of in person appointments. I also discussed with the patient that there may be a patient responsible charge related to this service. The patient expressed understanding and agreed to proceed. Patient in her home and I am in my home, no visual contact possible though desired   History of Present Illness: 2 day h/o runny nose , clear phglem , no fever or chills, usual flare of allergy sym[ptoms at this time of the year Denies recent fever or chills. Denies sinus pressure, , ear pain or sore throat. Denies chest congestion, productive cough or wheezing. Denies chest pains, palpitations and leg swelling Denies abdominal pain, nausea, vomiting,diarrhea or constipation.   Denies dysuria, frequency, hesitancy or incontinence. Denies joint pain, swelling and limitation in mobility. Denies headaches, seizures, numbness, or tingling. Denies depression,uncontrolled  anxiety or insomnia. Denies skin break down or rash.       Observations/Objective: BP 115/78   Ht 5\' 6"  (1.676 m)   Wt 209 lb (94.8 kg)   BMI 33.73 kg/m    Assessment and Plan:  Allergic rhinitis increased and uncontrolled symptoms, start patanol and singulair  IGT (impaired glucose tolerance) [name] is reminded of the importance of commitment to daily physical activity for 30 minutes or more, as able and the need to limit carbohydrate intake to 30 to 60 grams per meal to help with blood sugar control.   Updated lab needed at/ before next visit.   Diabetic Labs Latest Ref Rng & Units 08/24/2018 08/05/2018 07/28/2018 07/22/2018 07/09/2018  HbA1c <5.7 % of total Hgb - - - - -   Microalbumin Not estab mg/dL - - - - -  Micro/Creat Ratio <30 mcg/mg creat - - - - -  Chol 0 - 200 mg/dL - - - - -  HDL >40 mg/dL - - - - -  Calc LDL 0 - 99 mg/dL - - - - -  Triglycerides <150 mg/dL - - - - -  Creatinine 0.44 - 1.00 mg/dL 0.85 1.32(H) 0.82 0.92 1.19(H)  GFR >60.00 mL/min - - - - -   BP/Weight 11/22/2018 10/06/2018 09/23/2018 08/24/2018 07/28/2018 07/22/2018 96/09/2295  Systolic BP 989 211 941 740 814 481 90  Diastolic BP 78 62 72 80 76 64 60  Wt. (Lbs) 209 206 198 205.4 205.8 202.6 204  BMI 33.73 33.25 31.96 33.15 33.22 32.7 32.93   Foot/eye exam completion dates Latest Ref Rng & Units 11/04/2017 09/24/2016  Eye Exam No Retinopathy - -  Foot Form Completion - Done Done        Insomnia Sleep hygiene reviewed and written information offered also. Prescription sent for  medication needed.   Morbid obesity Deteriorated.  Patient re-educated about  the importance of commitment to a  minimum of 150 minutes of exercise per week as able.  The importance of healthy food choices with portion control discussed, as well as eating regularly and within a 12 hour window most days. The need to choose "clean , green" food 50 to 75% of the time is discussed, as well as to make water the primary drink and set a goal of 64 ounces water daily.  Encouraged to start a food diary,  and to consider  joining a support group. Sample diet sheets offered. Goals set by the patient for the next several months.   Weight /BMI 11/22/2018 10/06/2018 09/23/2018  WEIGHT 209 lb 206 lb 198 lb  HEIGHT 5\' 6"  - 5\' 6"   BMI 33.73 kg/m2 33.25 kg/m2 31.96 kg/m2      Chronic systolic heart failure (HCC) Denies symptoms of decompensation   Follow Up Instructions:    I discussed the assessment and treatment plan with the patient. The patient was provided an opportunity to ask questions and all were answered. The patient agreed with the plan and demonstrated an understanding of the instructions.   The  patient was advised to call back or seek an in-person evaluation if the symptoms worsen or if the condition fails to improve as anticipated.  I provided 25 minutes of non-face-to-face time during this encounter.   Tula Nakayama, MD

## 2018-11-22 NOTE — Assessment & Plan Note (Signed)
Deteriorated.  Patient re-educated about  the importance of commitment to a  minimum of 150 minutes of exercise per week as able.  The importance of healthy food choices with portion control discussed, as well as eating regularly and within a 12 hour window most days. The need to choose "clean , green" food 50 to 75% of the time is discussed, as well as to make water the primary drink and set a goal of 64 ounces water daily.  Encouraged to start a food diary,  and to consider  joining a support group. Sample diet sheets offered. Goals set by the patient for the next several months.   Weight /BMI 11/22/2018 10/06/2018 09/23/2018  WEIGHT 209 lb 206 lb 198 lb  HEIGHT 5\' 6"  - 5\' 6"   BMI 33.73 kg/m2 33.25 kg/m2 31.96 kg/m2

## 2018-11-22 NOTE — Assessment & Plan Note (Signed)
increased and uncontrolled symptoms, start patanol and singulair

## 2018-11-22 NOTE — Patient Instructions (Addendum)
Wellness with nurse past due, please schedule  Annual physical exam with MD  End August with flu vaccine  Congrats on remaining nicotine free since 06/2018, keep it up!   Mammogram to be scheduled for July or after   For allergy symptoms, once daily tablet , montelukast is prescribed, use  this as well as cover your nose and moth with a mask during high pollen count , also following recent guideline to reduce risk of exposure to COVID 19, while keeping social distancing going  Fasting lipid, cmp and EGFr, hBa1C and magnesium level 1 week before appt in August  Thanks for choosing [facility], we consider it a privelige to serve you.

## 2018-11-22 NOTE — Assessment & Plan Note (Signed)
Sleep hygiene reviewed and written information offered also. Prescription sent for  medication needed.  

## 2018-11-22 NOTE — Assessment & Plan Note (Addendum)
[name] is reminded of the importance of commitment to daily physical activity for 30 minutes or more, as able and the need to limit carbohydrate intake to 30 to 60 grams per meal to help with blood sugar control.   Updated lab needed at/ before next visit.   Diabetic Labs Latest Ref Rng & Units 08/24/2018 08/05/2018 07/28/2018 07/22/2018 07/09/2018  HbA1c <5.7 % of total Hgb - - - - -  Microalbumin Not estab mg/dL - - - - -  Micro/Creat Ratio <30 mcg/mg creat - - - - -  Chol 0 - 200 mg/dL - - - - -  HDL >40 mg/dL - - - - -  Calc LDL 0 - 99 mg/dL - - - - -  Triglycerides <150 mg/dL - - - - -  Creatinine 0.44 - 1.00 mg/dL 0.85 1.32(H) 0.82 0.92 1.19(H)  GFR >60.00 mL/min - - - - -   BP/Weight 11/22/2018 10/06/2018 09/23/2018 08/24/2018 07/28/2018 07/22/2018 18/04/8420  Systolic BP 031 281 188 677 373 668 90  Diastolic BP 78 62 72 80 76 64 60  Wt. (Lbs) 209 206 198 205.4 205.8 202.6 204  BMI 33.73 33.25 31.96 33.15 33.22 32.7 32.93   Foot/eye exam completion dates Latest Ref Rng & Units 11/04/2017 09/24/2016  Eye Exam No Retinopathy - -  Foot Form Completion - Done Done

## 2018-12-13 ENCOUNTER — Other Ambulatory Visit: Payer: Self-pay

## 2018-12-13 ENCOUNTER — Ambulatory Visit (INDEPENDENT_AMBULATORY_CARE_PROVIDER_SITE_OTHER): Payer: Medicare Other

## 2018-12-13 DIAGNOSIS — I10 Essential (primary) hypertension: Secondary | ICD-10-CM | POA: Diagnosis not present

## 2018-12-13 DIAGNOSIS — E785 Hyperlipidemia, unspecified: Secondary | ICD-10-CM | POA: Diagnosis not present

## 2018-12-13 DIAGNOSIS — T827XXA Infection and inflammatory reaction due to other cardiac and vascular devices, implants and grafts, initial encounter: Secondary | ICD-10-CM

## 2018-12-13 DIAGNOSIS — I5022 Chronic systolic (congestive) heart failure: Secondary | ICD-10-CM

## 2018-12-13 DIAGNOSIS — M62838 Other muscle spasm: Secondary | ICD-10-CM | POA: Diagnosis not present

## 2018-12-13 DIAGNOSIS — E118 Type 2 diabetes mellitus with unspecified complications: Secondary | ICD-10-CM | POA: Diagnosis not present

## 2018-12-14 ENCOUNTER — Ambulatory Visit (INDEPENDENT_AMBULATORY_CARE_PROVIDER_SITE_OTHER): Payer: Medicare Other | Admitting: Family Medicine

## 2018-12-14 ENCOUNTER — Encounter: Payer: Self-pay | Admitting: Family Medicine

## 2018-12-14 ENCOUNTER — Other Ambulatory Visit: Payer: Self-pay

## 2018-12-14 DIAGNOSIS — Z0000 Encounter for general adult medical examination without abnormal findings: Secondary | ICD-10-CM

## 2018-12-14 LAB — COMPLETE METABOLIC PANEL WITH GFR
AG Ratio: 1 (calc) (ref 1.0–2.5)
ALT: 23 U/L (ref 6–29)
AST: 35 U/L (ref 10–35)
Albumin: 3.8 g/dL (ref 3.6–5.1)
Alkaline phosphatase (APISO): 62 U/L (ref 37–153)
BUN: 14 mg/dL (ref 7–25)
CO2: 30 mmol/L (ref 20–32)
Calcium: 9.6 mg/dL (ref 8.6–10.4)
Chloride: 105 mmol/L (ref 98–110)
Creat: 0.96 mg/dL (ref 0.50–1.05)
GFR, Est African American: 78 mL/min/{1.73_m2} (ref >=60)
GFR, Est Non African American: 67 mL/min/{1.73_m2} (ref >=60)
Globulin: 3.7 g/dL (calc) (ref 1.9–3.7)
Glucose, Bld: 102 mg/dL — ABNORMAL HIGH (ref 65–99)
Potassium: 3.8 mmol/L (ref 3.5–5.3)
Sodium: 141 mmol/L (ref 135–146)
Total Bilirubin: 0.3 mg/dL (ref 0.2–1.2)
Total Protein: 7.5 g/dL (ref 6.1–8.1)

## 2018-12-14 LAB — LIPID PANEL
Cholesterol: 112 mg/dL (ref <200)
HDL: 35 mg/dL — ABNORMAL LOW (ref >=50)
LDL Cholesterol (Calc): 60 mg/dL (calc)
Non-HDL Cholesterol (Calc): 77 mg/dL (calc) (ref <130)
Total CHOL/HDL Ratio: 3.2 (calc) (ref <5.0)
Triglycerides: 90 mg/dL (ref <150)

## 2018-12-14 LAB — HEMOGLOBIN A1C
Hgb A1c MFr Bld: 6.3 % of total Hgb — ABNORMAL HIGH (ref <5.7)
Mean Plasma Glucose: 134 (calc)
eAG (mmol/L): 7.4 (calc)

## 2018-12-14 LAB — MAGNESIUM: Magnesium: 1.8 mg/dL (ref 1.5–2.5)

## 2018-12-14 NOTE — Patient Instructions (Signed)
[name] , Thank you for taking time to come for your Medicare Wellness Visit. I appreciate your ongoing commitment to your health goals. Please review the following plan we discussed and let me know if I can assist you in the future.   Screening recommendations/referrals: Colonoscopy: Due again in 2022 Mammogram: Due 2020 Bone Density: discuss with Dr Moshe Cipro in coming years Recommended yearly ophthalmology/optometry visit for glaucoma screening and checkup Recommended yearly dental visit for hygiene and checkup  Vaccinations: Influenza vaccine: due oct 2020   Preventive Care 40-64 Years, Female Preventive care refers to lifestyle choices and visits with your health care provider that can promote health and wellness. What does preventive care include?  A yearly physical exam. This is also called an annual well check.  Dental exams once or twice a year.  Routine eye exams. Ask your health care provider how often you should have your eyes checked.  Personal lifestyle choices, including:  Daily care of your teeth and gums.  Regular physical activity.  Eating a healthy diet.  Avoiding tobacco and drug use.  Limiting alcohol use.  Practicing safe sex.  Taking low-dose aspirin daily starting at age 16.  Taking vitamin and mineral supplements as recommended by your health care provider. What happens during an annual well check? The services and screenings done by your health care provider during your annual well check will depend on your age, overall health, lifestyle risk factors, and family history of disease. Counseling  Your health care provider may ask you questions about your:  Alcohol use.  Tobacco use.  Drug use.  Emotional well-being.  Home and relationship well-being.  Sexual activity.  Eating habits.  Work and work Statistician.  Method of birth control.  Menstrual cycle.  Pregnancy history. Screening  You may have the following tests or  measurements:  Height, weight, and BMI.  Blood pressure.  Lipid and cholesterol levels. These may be checked every 5 years, or more frequently if you are over 83 years old.  Skin check.  Lung cancer screening. You may have this screening every year starting at age 53 if you have a 30-pack-year history of smoking and currently smoke or have quit within the past 15 years.  Fecal occult blood test (FOBT) of the stool. You may have this test every year starting at age 54.  Flexible sigmoidoscopy or colonoscopy. You may have a sigmoidoscopy every 5 years or a colonoscopy every 10 years starting at age 53.  Hepatitis C blood test.  Hepatitis B blood test.  Sexually transmitted disease (STD) testing.  Diabetes screening. This is done by checking your blood sugar (glucose) after you have not eaten for a while (fasting). You may have this done every 1-3 years.  Mammogram. This may be done every 1-2 years. Talk to your health care provider about when you should start having regular mammograms. This may depend on whether you have a family history of breast cancer.  BRCA-related cancer screening. This may be done if you have a family history of breast, ovarian, tubal, or peritoneal cancers.  Pelvic exam and Pap test. This may be done every 3 years starting at age 8. Starting at age 40, this may be done every 5 years if you have a Pap test in combination with an HPV test.  Bone density scan. This is done to screen for osteoporosis. You may have this scan if you are at high risk for osteoporosis. Discuss your test results, treatment options, and if necessary, the  need for more tests with your health care provider. Vaccines  Your health care provider may recommend certain vaccines, such as:  Influenza vaccine. This is recommended every year.  Tetanus, diphtheria, and acellular pertussis (Tdap, Td) vaccine. You may need a Td booster every 10 years.  Zoster vaccine. You may need this after age  22.  Pneumococcal 13-valent conjugate (PCV13) vaccine. You may need this if you have certain conditions and were not previously vaccinated.  Pneumococcal polysaccharide (PPSV23) vaccine. You may need one or two doses if you smoke cigarettes or if you have certain conditions. Talk to your health care provider about which screenings and vaccines you need and how often you need them. This information is not intended to replace advice given to you by your health care provider. Make sure you discuss any questions you have with your health care provider. Document Released: 08/31/2015 Document Revised: 04/23/2016 Document Reviewed: 06/05/2015 Elsevier Interactive Patient Education  2017 [place] Prevention in the Home Falls can cause injuries. They can happen to people of all ages. There are many things you can do to make your home safe and to help prevent falls. What can I do on the outside of my home?  Regularly fix the edges of walkways and driveways and fix any cracks.  Remove anything that might make you trip as you walk through a door, such as a raised step or threshold.  Trim any bushes or trees on the path to your home.  Use bright outdoor lighting.  Clear any walking paths of anything that might make someone trip, such as rocks or tools.  Regularly check to see if handrails are loose or broken. Make sure that both sides of any steps have handrails.  Any raised decks and porches should have guardrails on the edges.  Have any leaves, snow, or ice cleared regularly.  Use sand or salt on walking paths during winter.  Clean up any spills in your garage right away. This includes oil or grease spills. What can I do in the bathroom?  Use night lights.  Install grab bars by the toilet and in the tub and shower. Do not use towel bars as grab bars.  Use non-skid mats or decals in the tub or shower.  If you need to sit down in the shower, use a plastic, non-slip stool.   Keep the floor dry. Clean up any water that spills on the floor as soon as it happens.  Remove soap buildup in the tub or shower regularly.  Attach bath mats securely with double-sided non-slip rug tape.  Do not have throw rugs and other things on the floor that can make you trip. What can I do in the bedroom?  Use night lights.  Make sure that you have a light by your bed that is easy to reach.  Do not use any sheets or blankets that are too big for your bed. They should not hang down onto the floor.  Have a firm chair that has side arms. You can use this for support while you get dressed.  Do not have throw rugs and other things on the floor that can make you trip. What can I do in the kitchen?  Clean up any spills right away.  Avoid walking on wet floors.  Keep items that you use a lot in easy-to-reach places.  If you need to reach something above you, use a strong step stool that has a grab bar.  Keep electrical cords out of the way.  Do not use floor polish or wax that makes floors slippery. If you must use wax, use non-skid floor wax.  Do not have throw rugs and other things on the floor that can make you trip. What can I do with my stairs?  Do not leave any items on the stairs.  Make sure that there are handrails on both sides of the stairs and use them. Fix handrails that are broken or loose. Make sure that handrails are as long as the stairways.  Check any carpeting to make sure that it is firmly attached to the stairs. Fix any carpet that is loose or worn.  Avoid having throw rugs at the top or bottom of the stairs. If you do have throw rugs, attach them to the floor with carpet tape.  Make sure that you have a light switch at the top of the stairs and the bottom of the stairs. If you do not have them, ask someone to add them for you. What else can I do to help prevent falls?  Wear shoes that:  Do not have high heels.  Have rubber bottoms.  Are  comfortable and fit you well.  Are closed at the toe. Do not wear sandals.  If you use a stepladder:  Make sure that it is fully opened. Do not climb a closed stepladder.  Make sure that both sides of the stepladder are locked into place.  Ask someone to hold it for you, if possible.  Clearly mark and make sure that you can see:  Any grab bars or handrails.  First and last steps.  Where the edge of each step is.  Use tools that help you move around (mobility aids) if they are needed. These include:  Canes.  Walkers.  Scooters.  Crutches.  Turn on the lights when you go into a dark area. Replace any light bulbs as soon as they burn out.  Set up your furniture so you have a clear path. Avoid moving your furniture around.  If any of your floors are uneven, fix them.  If there are any pets around you, be aware of where they are.  Review your medicines with your doctor. Some medicines can make you feel dizzy. This can increase your chance of falling. Ask your doctor what other things that you can do to help prevent falls. This information is not intended to replace advice given to you by your health care provider. Make sure you discuss any questions you have with your health care provider. Document Released: 05/31/2009 Document Revised: 01/10/2016 Document Reviewed: 09/08/2014 Elsevier Interactive Patient Education  2017 [employer].

## 2018-12-14 NOTE — Progress Notes (Signed)
EPIC Encounter for ICM Monitoring  Patient Name: [name] is a 53 y.o. female Date: 12/14/2018 Primary Care Physican: Fayrene Helper, MD Primary Cardiologist: Klein/Bensimhon Electrophysiologist: Caryl Comes 11/09/2018 Weight:205lbs 12/14/2018 Weight: 210 lbs   Heart failure questions reviewed. Patient is symptomatic with weight gain of a few pounds.   Thoracic impedanceabnormalsuggesting fluidsince 12/06/2018.  Prescribed:Furosemide40 mgTake 1 tablet (40 mg total) daily along with 40 mg extra on days you have more fluid.    Labs: 09/03/2018 Creatinine0.85, BUN6, Potassium 4.3, Sodium139, GFR>60 12/19/2019Creatinine 1.32, BUN15, Potassium4.1, Sodium136, VPC34-03  12/11/2019Creatinine 0.82, BUN10, Potassium3.2, Sodium140, GFR>60  12/05/2019Creatinine 0.82, BUN10, Potassium3.2, Sodium140, GFR>60  07/22/2018 Creatinine0.92, BUN13, Potassium3.9, TCYELY590, GFR>60  07/09/2018 Creatinine1.19, BUN13, Potassium4.4, Sodium136, BPJ12-16  07/08/2018 Creatinine1.12, BUN11, Potassium4.4, [id number], GFR55->60 A complete set of results can be found in Results Review.  Recommendations:She had self adjusted Lasix and taking extra tablet x 2 days.  Advised to call if weight does not return to baseline.   Follow-up plan: ICM clinic phone appointment on 12/29/2018 to recheck fluid levels.  Visit scheduled 12/30/2018 with HF clinic PA/NP.  Copy of ICM check sent to Dr.Kleinand Dr Haroldine Laws   3 month ICM trend: 12/13/2018    1 Year ICM trend:       [name], RN 12/14/2018 9:30 AM

## 2018-12-14 NOTE — Progress Notes (Signed)
Subjective:   [name] is a 53 y.o. female who presents for Medicare Annual (Subsequent) preventive examination.  Location of Patient: Home Location of Provider: Telehealth Consent was obtain for visit to be over via telehealth. I verified that I am speaking with the correct person using two identifiers.   Review of Systems:   Cardiac Risk Factors include: Other (see comment), Risk factor comments: cardiac conditions     Objective:     Vitals: There were no vitals taken for this visit.  There is no height or weight on file to calculate BMI.  Advanced Directives 07/20/2018 07/06/2018 06/22/2018 02/08/2018 01/02/2018 01/01/2018 12/25/2017  Does Patient Have a Medical Advance Directive? No No No No No No No  Would patient like information on creating a medical advance directive? No - Patient declined No - Patient declined No - Patient declined No - Patient declined No - Patient declined - No - Patient declined  Pre-existing out of facility DNR order (yellow form or pink MOST form) - - - - - - -    Tobacco Social History   Tobacco Use  Smoking Status Former Smoker  . Packs/day: 0.50  . Years: 15.00  . Pack years: 7.50  . Types: Cigarettes  . Last attempt to quit: 05/17/2018  . Years since quitting: 0.5  Smokeless Tobacco Never Used     Counseling given: Not Answered   Clinical Intake:  Pre-visit preparation completed: Yes  Pain : No/denies pain     Nutritional Risks: None Diabetes: No  How often do you need to have someone help you when you read instructions, pamphlets, or other written materials from your doctor or pharmacy?: 1 - Never What is the last grade level you completed in school?: college  Interpreter Needed?: No     Past Medical History:  Diagnosis Date  . Arthritis of knee   . Automatic implantable cardiac defibrillator in situ    a. s/p prior Medtronic ICD with 6949 lead. b. s/p Gen change & lead revision 05/2013.  . Cardiomyopathy  secondary    Patient has cardiomyopathy out of proportion to her ischemic heart disease  . Chronic pain   . coronary artery disease    RCA stenting Myoview 2011 EF 30% infarction dilatation without ischemia  . Coronary artery disease   . Depression   . Diabetes mellitus   . GERD (gastroesophageal reflux disease)   . Insomnia   . Lupus (systemic lupus erythematosus) ([place])   . Membranous glomerulonephritis   . Migraines   . Myocardial infarction ([place])    8 total   . Nicotine abuse   . Other and unspecified hyperlipidemia   . Paroxysmal VT ([place])   . Systolic CHF ([place])   . VF (ventricular fibrillation) ([place])    a. Hx appropriate ICD therapy for VF.   Past Surgical History:  Procedure Laterality Date  . ABDOMINAL HYSTERECTOMY    . CHOLECYSTECTOMY    . COLONOSCOPY  07/15/2011   SLF: internal hemorrhoids/hyperplastic polyps in the rectum/tubular adenomaSURVEILLANCE Nov 2017  . defibrillator placed   2009 and 05/2013  . ICD GENERATOR CHANGE  06/17/2013   Dr Caryl Comes  . IMPLANTABLE CARDIOVERTER DEFIBRILLATOR (ICD) GENERATOR CHANGE Left 06/17/2013   Procedure: ICD GENERATOR CHANGE;  Surgeon: Deboraha Sprang, MD;  Location: [facility] CATH LAB;  Service: Cardiovascular;  Laterality: Left;  . LEAD REVISION N/A 06/17/2013   Procedure: LEAD REVISION;  Surgeon: Deboraha Sprang, MD;  Location: [facility] CATH LAB;  Service: Cardiovascular;  Laterality: N/A;  . LEFT HEART CATH AND CORONARY ANGIOGRAPHY N/A 06/24/2018   Procedure: LEFT HEART CATH AND CORONARY ANGIOGRAPHY;  Surgeon: Jolaine Artist, MD;  Location: [place] CV LAB;  Service: Cardiovascular;  Laterality: N/A;  . RIGHT HEART CATH N/A 06/29/2018   Procedure: RIGHT HEART CATH;  Surgeon: Jolaine Artist, MD;  Location: [place] CV LAB;  Service: Cardiovascular;  Laterality: N/A;  . ROTATOR CUFF REPAIR Right 2002   Family History  Problem Relation Age of Onset  . Hepatitis Mother 8       HCV  . Liver cancer Mother 91  . Cancer Mother    . Diabetes Mother   . Heart disease Mother   . Hyperlipidemia Mother   . Stroke Sister 45       x3  . Diabetes Sister   . Hyperlipidemia Sister   . Dementia Father   . HIV Sister   . Diabetes Brother   . Heart disease Brother   . Hyperlipidemia Brother    Social History   Socioeconomic History  . Marital status: Single    Spouse name: Not on file  . Number of children: 2  . Years of education: Not on file  . Highest education level: Master's degree (e.g., MA, MS, MEng, MEd, MSW, MBA)  Occupational History  . Occupation: disabled    Fish farm manager: UNEMPLOYED  Social Needs  . Financial resource strain: Somewhat hard  . Food insecurity:    Worry: Never true    Inability: Never true  . Transportation needs:    Medical: No    Non-medical: No  Tobacco Use  . Smoking status: Former Smoker    Packs/day: 0.50    Years: 15.00    Pack years: 7.50    Types: Cigarettes    Last attempt to quit: 05/17/2018    Years since quitting: 0.5  . Smokeless tobacco: Never Used  Substance and Sexual Activity  . Alcohol use: Yes    Alcohol/week: 0.0 standard drinks    Comment: occasionaly  . Drug use: No  . Sexual activity: Not Currently    Birth control/protection: Surgical  Lifestyle  . Physical activity:    Days per week: 0 days    Minutes per session: 0 min  . Stress: Not at all  Relationships  . Social connections:    Talks on phone: More than three times a week    Gets together: Once a week    Attends religious service: Never    Active member of club or organization: No    Attends meetings of clubs or organizations: Never    Relationship status: Never married  Other Topics Concern  . Not on file  Social History Narrative  . Not on file    Outpatient Encounter Medications as of 12/14/2018  Medication Sig  . albuterol (PROVENTIL HFA;VENTOLIN HFA) 108 (90 Base) MCG/ACT inhaler Inhale 2 puffs into the lungs every 6 (six) hours as needed for wheezing or shortness of breath.  .  ALPRAZolam (XANAX) 1 MG tablet Take 1 tablet (1 mg total) by mouth at bedtime.  Marland Kitchen amiodarone (PACERONE) 200 MG tablet Take 1 tablet (200 mg total) by mouth 2 (two) times daily.  Marland Kitchen aspirin 81 MG tablet Take 1 tablet (81 mg total) by mouth daily.  Marland Kitchen atorvastatin (LIPITOR) 10 MG tablet Take 1 tablet (10 mg total) by mouth daily.  . carvedilol (COREG) 3.125 MG tablet TAKE (1) TABLET BY MOUTH TWICE DAILY.  . cholecalciferol (VITAMIN D)  1000 units tablet Take 1,000 Units by mouth daily.  . cyclobenzaprine (FLEXERIL) 5 MG tablet Take 1 tablet (5 mg total) by mouth at bedtime.  . digoxin (LANOXIN) 0.125 MG tablet Take 1 tablet (0.125 mg total) by mouth every other day.  . famotidine (PEPCID) 20 MG tablet Take 1 tablet (20 mg total) by mouth 2 (two) times daily.  . fenofibrate (TRICOR) 145 MG tablet Take 1 tablet (145 mg total) by mouth daily.  . furosemide (LASIX) 40 MG tablet Take 2 tablets (80 mg total) by mouth daily for 30 days. Take 40 mg daily along with 40 mg extra on days you have more fluid  . losartan (COZAAR) 50 MG tablet Take 1 tablet (50 mg total) by mouth daily.  . magnesium oxide (MAG-OX) 400 MG tablet Take 0.5 tablets (200 mg total) by mouth daily. Take 1/2 tab (200 mg total) by mouth once daily.  . montelukast (SINGULAIR) 10 MG tablet Take 1 tablet (10 mg total) by mouth at bedtime.  Marland Kitchen oxyCODONE (ROXICODONE) 15 MG immediate release tablet Take 15 mg by mouth every 8 (eight) hours.   . potassium chloride (K-DUR,KLOR-CON) 20 MEQ tablet Take 1 tablet (20 mEq total) by mouth daily.  . pantoprazole (PROTONIX) 40 MG tablet Take 1 tablet (40 mg total) by mouth daily.   No facility-administered encounter medications on file as of 12/14/2018.     Activities of Daily Living In your present state of health, do you have any difficulty performing the following activities: 12/14/2018 07/20/2018  Hearing? N N  Vision? N N  Difficulty concentrating or making decisions? N N  Walking or climbing  stairs? N N  Dressing or bathing? N N  Doing errands, shopping? N -  Preparing Food and eating ? N N  Using the Toilet? N N  In the past six months, have you accidently leaked urine? N N  Do you have problems with loss of bowel control? N N  Managing your Medications? N N  Managing your Finances? N N  Housekeeping or managing your Housekeeping? N N  Some recent data might be hidden    Patient Care Team: Fayrene Helper, MD as PCP - General Danie Binder, MD (Gastroenterology) Burden, Lincoln Brigham, MD as Referring Physician (Ophthalmology) Avelino Leeds, MD as Referring Physician (Internal Medicine) Harl Bowie Alphonse Guild, MD as Consulting Physician (Cardiology)    Assessment:   This is a routine wellness examination for Laiya.  Exercise Activities and Dietary recommendations Current Exercise Habits: The patient does not participate in regular exercise at present, Exercise limited by: cardiac condition(s)  Goals    . Quit smoking / using tobacco     Starting 09/09/2016 patient would like to cut back on her cigarettes and hopefully next year she have completely quit.       Fall Risk Fall Risk  12/14/2018 11/22/2018 09/23/2018 08/31/2018 07/20/2018  Falls in the past year? 0 0 0 0 0  Comment - - - - -  Number falls in past yr: - 0 0 - -  Injury with Fall? 0 0 0 - -  Risk for fall due to : - - - - -  Risk for fall due to: Comment - - - - -  Follow up - - - - -   Is the patient's home free of loose throw rugs in walkways, pet beds, electrical cords, etc?   no      Grab bars in the bathroom? no  Handrails on the stairs?   yes      Adequate lighting?   yes  Timed Get Up and Go performed: unable to assess  Depression Screen PHQ 2/9 Scores 12/14/2018 09/23/2018 07/20/2018 07/20/2018  PHQ - 2 Score 0 0 0 0  PHQ- 9 Score - - - 2     Cognitive Function     6CIT Screen 12/14/2018 02/08/2018 09/08/2016  What Year? 0 points 0 points 0 points  What month? 0 points 0 points 0 points   What time? 0 points 0 points 0 points  Count back from 20 0 points 0 points 0 points  Months in reverse 0 points 0 points 0 points  Repeat phrase 0 points 0 points 0 points  Total Score 0 0 0    Immunization History  Administered Date(s) Administered  . Influenza Split 06/19/2011, 06/30/2012  . Influenza Whole 06/25/2006, 08/07/2010  . Influenza,inj,Quad PF,6+ Mos 07/11/2013, 07/26/2015, 09/24/2016, 07/07/2017, 09/23/2018  . Pneumococcal Polysaccharide-23 03/15/2013  . Td 01/16/2004  . Tdap 06/19/2011    Qualifies for Shingles Vaccine? no  Screening Tests Health Maintenance  Topic Date Due  . PAP SMEAR-Modifier  08/28/2017  . OPHTHALMOLOGY EXAM  09/22/2018  . FOOT EXAM  11/05/2018  . INFLUENZA VACCINE  03/19/2019  . HEMOGLOBIN A1C  06/14/2019  . MAMMOGRAM  11/17/2019  . TETANUS/TDAP  06/18/2021  . COLONOSCOPY  07/15/2021  . PNEUMOCOCCAL POLYSACCHARIDE VACCINE AGE 11-64 HIGH RISK  Completed  . Hepatitis C Screening  Completed  . HIV Screening  Completed    Cancer Screenings: Lung: Low Dose CT Chest recommended if Age 16-80 years, 30 pack-year currently smoking OR have quit w/in 15years. Patient does not qualify. Breast:  Up to date on Mammogram? Yes   Up to date of Bone Density/Dexa? Needs to discuss with Dr Moshe Cipro Colorectal: Due in 2022  Additional Screenings:  Hepatitis C Screening: completed     Plan:    1. Encounter for Medicare annual wellness exam  I have personally reviewed and noted the following in the patient's chart:   . Medical and social history . Use of alcohol, tobacco or illicit drugs  . Current medications and supplements . Functional ability and status . Nutritional status . Physical activity . Advanced directives . List of other physicians . Hospitalizations, surgeries, and ER visits in previous 12 months . Vitals . Screenings to include cognitive, depression, and falls . Referrals and appointments  In addition, I have reviewed and  discussed with patient certain preventive protocols, quality metrics, and best practice recommendations. A written personalized care plan for preventive services as well as general preventive health recommendations were provided to patient.    I provided 20 minutes of non-face-to-face time during this encounter.  Perlie Mayo, NP  12/14/2018

## 2018-12-20 ENCOUNTER — Telehealth: Payer: Self-pay | Admitting: Internal Medicine

## 2018-12-20 NOTE — Telephone Encounter (Signed)
New message   [facility] for pt to call and schedule virtual visit with Dr. Donavan Burnet.

## 2018-12-24 NOTE — Telephone Encounter (Signed)
I called and left patient a message about scheduling a follow up visit from recall list. Trying to set up a virtual visit on 12/31/18.

## 2018-12-29 ENCOUNTER — Other Ambulatory Visit: Payer: Self-pay

## 2018-12-29 ENCOUNTER — Ambulatory Visit (INDEPENDENT_AMBULATORY_CARE_PROVIDER_SITE_OTHER): Payer: Medicare Other

## 2018-12-29 DIAGNOSIS — T827XXA Infection and inflammatory reaction due to other cardiac and vascular devices, implants and grafts, initial encounter: Secondary | ICD-10-CM

## 2018-12-29 DIAGNOSIS — I5022 Chronic systolic (congestive) heart failure: Secondary | ICD-10-CM

## 2018-12-30 ENCOUNTER — Encounter (HOSPITAL_COMMUNITY): Payer: Medicare Other

## 2018-12-31 ENCOUNTER — Other Ambulatory Visit: Payer: Self-pay

## 2018-12-31 ENCOUNTER — Telehealth (INDEPENDENT_AMBULATORY_CARE_PROVIDER_SITE_OTHER): Payer: Medicare Other | Admitting: Internal Medicine

## 2018-12-31 ENCOUNTER — Encounter: Payer: Self-pay | Admitting: Internal Medicine

## 2018-12-31 VITALS — Ht 66.0 in | Wt 211.2 lb

## 2018-12-31 DIAGNOSIS — I4729 Other ventricular tachycardia: Secondary | ICD-10-CM

## 2018-12-31 DIAGNOSIS — I472 Ventricular tachycardia: Secondary | ICD-10-CM

## 2018-12-31 MED ORDER — ATORVASTATIN CALCIUM 10 MG PO TABS: 10.0000 mg | ORAL_TABLET | Freq: Every day | ORAL | 3 refills | Status: AC

## 2018-12-31 MED ORDER — LOSARTAN POTASSIUM 50 MG PO TABS: 50.0000 mg | ORAL_TABLET | Freq: Every day | ORAL | 3 refills | Status: AC

## 2018-12-31 MED ORDER — FENOFIBRATE 145 MG PO TABS: 145.0000 mg | ORAL_TABLET | Freq: Every day | ORAL | 3 refills | Status: AC

## 2018-12-31 NOTE — Telephone Encounter (Signed)
Patient had telehealth visit with Dr. Caryl Comes today.

## 2018-12-31 NOTE — Progress Notes (Signed)
EPIC Encounter for ICM Monitoring  Patient Name: [name] is a 53 y.o. female Date: 12/31/2018 Primary Care Physican: Fayrene Helper, MD Primary Cardiologist: Klein/Bensimhon Electrophysiologist: Caryl Comes 3/24/2020Weight:205lbs 12/14/2018 Weight: 210 lbs   Transmission reviewed and results sent via mychart since patient has telehealth visit with Dr Caryl Comes today.    Thoracic impedancereturned to baseline normal on 12/29/2018.  Prescribed:Furosemide40 mgTake1 tablet (40 mgtotal)daily along with 40 mg extra on days you have more fluid.  Labs: 09/03/2018 Creatinine0.85, BUN6, Potassium 4.3, Sodium139, GFR>60 12/19/2019Creatinine 1.32, BUN15, Potassium4.1, Sodium136, UKG25-42  12/11/2019Creatinine 0.82, BUN10, Potassium3.2, Sodium140, GFR>60  12/05/2019Creatinine 0.82, BUN10, Potassium3.2, Sodium140, GFR>60  07/22/2018 Creatinine0.92, BUN13, Potassium3.9, [id number], GFR>60  07/09/2018 Creatinine1.19, BUN13, Potassium4.4, Sodium136, BTD17-61  07/08/2018 Creatinine1.12, BUN11, Potassium4.4, [id number], GFR55->60 A complete set of results can be found in Results Review.  Recommendations:No changes   Follow-up plan: ICM clinic phone appointment on 01/25/2019.Visit scheduled 01/05/2019 with HF clinic PA/NP.  Copy of ICM check sent to [place].  3 month ICM trend: 12/29/2018    1 Year ICM trend:       [name], RN 12/31/2018 3:17 PM

## 2018-12-31 NOTE — Progress Notes (Signed)
Electrophysiology TeleHealth Note   Due to national recommendations of social distancing due to COVID 19, an audio/video telehealth visit is felt to be most appropriate for this patient at this time.  See MyChart message from today for the patient's consent to telehealth for [facility].   Date:  12/31/2018   ID:  [name], [name] 06-13-1966, MRN [id number]  Location: patient's home  Provider location: [address] [place]  Evaluation Performed: Follow-up visit  PCP:  Fayrene Helper, MD  Cardiologist:  DB Electrophysiologist:  SK   Chief Complaint:  ICD  CHF  PVCs   History of Present Illness:    [name] is a 53 y.o. female who presents via audio/video conferencing for a telehealth visit today.  Since last being seen in our clinic, the patient was hospitalized for the initiation of amiodarone for PVC suppression  She has felt much better with less shortness of breath, fatigue.  No edema nocturnal dyspnea orthopnea or chest pain.  She has 2 complaints which she dates to the initiation of the antiarrhythmics.  The first is hypersensitivity of her back such that when she sits for a long time or lies down on her back it becomes extremely painful.  She also has a sense of being warm.  Her last TSH was January  Her other complaint is worsening of her heartburn.  She is now treating this with Maalox added to her PPI  She is also noted a loss of taste.  Patient notes  symptoms of heartburn but not really nausea, no sun sensitivity sensitivity as noted, not sure if any of these are attributable to amiodarone.  Surveillance laboratories were in normal limits when checked as below Date Cr K Hgb TSH LFTs  12/19 1.32 3.2  11.9 (11/19)      4*/20 0.96 3.8   3.29 (1/20) 23   DATE TEST EF   10/17 Echo    EF Severe depression      8/18 Echo    20-25 % LAE(43/2.01/42)  11/18 Myoview  13% Large scar w/o ischemia  11/19 LHC  RCA ISR//LM 65%    2/20 Echo  25-35%      The patient denies symptoms of fevers, chills, cough, or new SOB worrisome for COVID 19.   Past Medical History:  Diagnosis Date  . Arthritis of knee   . Automatic implantable cardiac defibrillator in situ    a. s/p prior Medtronic ICD with 6949 lead. b. s/p Gen change & lead revision 05/2013.  . Cardiomyopathy secondary    Patient has cardiomyopathy out of proportion to her ischemic heart disease  . Chronic pain   . coronary artery disease    RCA stenting Myoview 2011 EF 30% infarction dilatation without ischemia  . Coronary artery disease   . Depression   . Diabetes mellitus   . GERD (gastroesophageal reflux disease)   . Insomnia   . Lupus (systemic lupus erythematosus) ([place])   . Membranous glomerulonephritis   . Migraines   . Myocardial infarction ([place])    8 total   . Nicotine abuse   . Other and unspecified hyperlipidemia   . Paroxysmal VT ([place])   . Systolic CHF ([place])   . VF (ventricular fibrillation) ([place])    a. Hx appropriate ICD therapy for VF.    Past Surgical History:  Procedure Laterality Date  . ABDOMINAL HYSTERECTOMY    . CHOLECYSTECTOMY    . COLONOSCOPY  07/15/2011  SLF: internal hemorrhoids/hyperplastic polyps in the rectum/tubular adenomaSURVEILLANCE Nov 2017  . defibrillator placed   2009 and 05/2013  . ICD GENERATOR CHANGE  06/17/2013   Dr Caryl Comes  . IMPLANTABLE CARDIOVERTER DEFIBRILLATOR (ICD) GENERATOR CHANGE Left 06/17/2013   Procedure: ICD GENERATOR CHANGE;  Surgeon: Deboraha Sprang, MD;  Location[facility] CATH LAB;  Service: Cardiovascular;  Laterality: Left;  . LEAD REVISION N/A 06/17/2013   Procedure: LEAD REVISION;  Surgeon: Deboraha Sprang, MD;  Location: [facility] CATH LAB;  Service: Cardiovascular;  Laterality: N/A;  . LEFT HEART CATH AND CORONARY ANGIOGRAPHY N/A 06/24/2018   Procedure: LEFT HEART CATH AND CORONARY ANGIOGRAPHY;  Surgeon: Jolaine Artist, MD;  Location: [place] CV LAB;  Service: Cardiovascular;  Laterality:  N/A;  . RIGHT HEART CATH N/A 06/29/2018   Procedure: RIGHT HEART CATH;  Surgeon: Jolaine Artist, MD;  Location: [place] CV LAB;  Service: Cardiovascular;  Laterality: N/A;  . ROTATOR CUFF REPAIR Right 2002    Current Outpatient Medications  Medication Sig Dispense Refill  . albuterol (PROVENTIL HFA;VENTOLIN HFA) 108 (90 Base) MCG/ACT inhaler Inhale 2 puffs into the lungs every 6 (six) hours as needed for wheezing or shortness of breath. 1 Inhaler 5  . ALPRAZolam (XANAX) 1 MG tablet Take 1 tablet (1 mg total) by mouth at bedtime. 30 tablet 5  . amiodarone (PACERONE) 200 MG tablet Take 1 tablet (200 mg total) by mouth 2 (two) times daily. 60 tablet 6  . aspirin 81 MG tablet Take 1 tablet (81 mg total) by mouth daily. 30 tablet 6  . atorvastatin (LIPITOR) 10 MG tablet Take 1 tablet (10 mg total) by mouth daily. 90 tablet 2  . carvedilol (COREG) 3.125 MG tablet TAKE (1) TABLET BY MOUTH TWICE DAILY. 180 tablet 0  . cholecalciferol (VITAMIN D) 1000 units tablet Take 1,000 Units by mouth daily.    . cyclobenzaprine (FLEXERIL) 5 MG tablet Take 1 tablet (5 mg total) by mouth at bedtime. 30 tablet 3  . famotidine (PEPCID) 20 MG tablet Take 1 tablet (20 mg total) by mouth 2 (two) times daily. 60 tablet 0  . fenofibrate (TRICOR) 145 MG tablet Take 1 tablet (145 mg total) by mouth daily. 90 tablet 3  . furosemide (LASIX) 40 MG tablet Take 2 tablets (80 mg total) by mouth daily for 30 days. Take 40 mg daily along with 40 mg extra on days you have more fluid 60 tablet 6  . losartan (COZAAR) 50 MG tablet Take 1 tablet (50 mg total) by mouth daily. 30 tablet 6  . magnesium oxide (MAG-OX) 400 MG tablet Take 0.5 tablets (200 mg total) by mouth daily. Take 1/2 tab (200 mg total) by mouth once daily. 45 tablet 0  . mexiletine (MEXITIL) 200 MG capsule Take 200 mg by mouth 3 (three) times daily.    . montelukast (SINGULAIR) 10 MG tablet Take 1 tablet (10 mg total) by mouth at bedtime. 30 tablet 3  .  oxyCODONE (ROXICODONE) 15 MG immediate release tablet Take 15 mg by mouth every 8 (eight) hours.     . pantoprazole (PROTONIX) 40 MG tablet Take 1 tablet (40 mg total) by mouth daily. 30 tablet 6  . potassium chloride (K-DUR,KLOR-CON) 20 MEQ tablet Take 1 tablet (20 mEq total) by mouth daily. 30 tablet 6   No current facility-administered medications for this visit.     Allergies:   Amiodarone; Bee venom; Cortisone; Mexiletine; Plaquenil [hydroxychloroquine]; Robaxin [methocarbamol]; and Tape   Social History:  The patient  reports that she quit smoking about 7 months ago. Her smoking use included cigarettes. She has a 7.50 pack-year smoking history. She has never used smokeless tobacco. She reports current alcohol use. She reports that she does not use drugs.   Family History:  The patient's   family history includes Cancer in her mother; Dementia in her father; Diabetes in her brother, mother, and sister; HIV in her sister; Heart disease in her brother and mother; Hepatitis (age of onset: 30) in her mother; Hyperlipidemia in her brother, mother, and sister; Liver cancer (age of onset: 88) in her mother; Stroke (age of onset: 66) in her sister.   ROS:  Please see the history of present illness.   All other systems are personally reviewed and negative.    Exam:    Vital Signs:  Ht 5\' 6"  (1.676 m)   Wt 211 lb 3.2 oz (95.8 kg)   BMI 34.09 kg/m     Well appearing, alert and conversant, regular work of breathing,  good skin color Eyes- anicteric, neuro- grossly intact, skin- no apparent rash or lesions or cyanosis, mouth- oral mucosa is pink   Labs/Other Tests and Data Reviewed:    Recent Labs: 07/05/2018: Hemoglobin 11.9; Platelets 362 07/28/2018: B Natriuretic Peptide 138.9 08/24/2018: TSH 3.293 12/13/2018: ALT 23; BUN 14; Creat 0.96; Magnesium 1.8; Potassium 3.8; Sodium 141   Wt Readings from Last 3 Encounters:  12/31/18 211 lb 3.2 oz (95.8 kg)  11/22/18 209 lb (94.8 kg)  10/06/18  206 lb (93.4 kg)     Other studies personally reviewed: Additional studies/ records that were reviewed today include: As above  Last device remote is reviewed from [place] PDF dated 3/20 which reveals normal device function,   arrhythmias - none     ASSESSMENT & PLAN:     Ischemic/nonischemic cardiomyopathy  Implantable defibrillator-Medtronic S./P. revision   Ventricular tachycardia recurrent  High Risk Medication Surveillance --Amiodarone  Ventricular Ectopy complex   Congestive heart failure-chronic-systolic  GE reflux disease  Skin hyperesthesia  Loss of taste   The patient's volume status appears to be euvolemic.  Functional status is significantly improved since initiation of antiarrhythmic therapy.  She remains on a combination of mexiletine and amiodarone.  Her PVC burden is extremely low.  Complaints of loss of taste and hyperesthesia and worsening reflux concurrent with the medications beg a drug to explain it.  Up-to-date drug list does not include either 1 of these medications.  I did find [employer] associations between amiodarone and loss of taste but not with mexiletine  The GE reflux could be either; it would be much easier to test mexiletine given its shorter half-life.  We will stop it.  She is to see heart failure clinic next week.  Will reach out to them to make sure that they query her as to symptoms status  Needs TSH.  Her sensation of warmth may be related to her thyroid   COVID 19 screen The patient denies symptoms of COVID 19 at this time.  The importance of social distancing was discussed today.  Follow-up:  66m Next remote: As Scheduled   Current medicines are reviewed at length with the patient today.   The patient has concerns regarding her medicines.  The following changes were made today:   Stop mexiletine   Labs/ tests ordered today include: *TSH No orders of the defined types were placed in this encounter.   Future tests ( post COVID  )  Patient Risk:  after full review of this patients clinical status, I feel that they are at moderate risk at this time.  Today, I have spent  6 minutes with the patient with telehealth technology discussing the above.  Signed, Virl Axe, MD  12/31/2018 3:30 PM     [place] [address] [phone] (office) [phone] (fax)

## 2018-12-31 NOTE — Patient Instructions (Signed)
Medication Instructions:  Stop Mexiletine  Labwork: TSH next week   Testing/Procedures: None Ordered  Follow-Up: Your physician wants you to follow-up in: 6 months with Dr. Caryl Comes. You will receive a reminder letter in the mail two months in advance. If you don't receive a letter, please call our office to schedule the follow-up appointment. Keep your remote device check 01/24/19   Any Other Special Instructions Will Be Listed Below (If Applicable).     If you need a refill on your cardiac medications before your next appointment, please call your pharmacy.

## 2019-01-05 ENCOUNTER — Other Ambulatory Visit: Payer: Medicare Other | Admitting: *Deleted

## 2019-01-05 ENCOUNTER — Encounter (HOSPITAL_COMMUNITY): Payer: Self-pay

## 2019-01-05 ENCOUNTER — Other Ambulatory Visit: Payer: Self-pay

## 2019-01-05 ENCOUNTER — Ambulatory Visit (HOSPITAL_COMMUNITY)
Admission: RE | Admit: 2019-01-05 | Discharge: 2019-01-05 | Disposition: A | Discharge status: Home / Self Care | Payer: Medicare Other | Source: Ambulatory Visit | Attending: Cardiology | Admitting: Cardiology

## 2019-01-05 VITALS — Wt 205.0 lb

## 2019-01-05 DIAGNOSIS — I255 Ischemic cardiomyopathy: Secondary | ICD-10-CM

## 2019-01-05 DIAGNOSIS — I4729 Other ventricular tachycardia: Secondary | ICD-10-CM

## 2019-01-05 DIAGNOSIS — I472 Ventricular tachycardia: Secondary | ICD-10-CM | POA: Diagnosis not present

## 2019-01-05 DIAGNOSIS — I428 Other cardiomyopathies: Secondary | ICD-10-CM | POA: Diagnosis not present

## 2019-01-05 DIAGNOSIS — I5022 Chronic systolic (congestive) heart failure: Secondary | ICD-10-CM

## 2019-01-05 DIAGNOSIS — I2510 Atherosclerotic heart disease of native coronary artery without angina pectoris: Secondary | ICD-10-CM

## 2019-01-05 DIAGNOSIS — R002 Palpitations: Secondary | ICD-10-CM

## 2019-01-05 LAB — TSH: TSH: 5.56 u[IU]/mL — ABNORMAL HIGH (ref 0.450–4.500)

## 2019-01-05 NOTE — Addendum Note (Signed)
Encounter addended by: Valeda Malm, RN on: 01/05/2019 3:22 PM  Actions taken: Order list changed, Diagnosis association updated, Clinical Note Signed

## 2019-01-05 NOTE — Progress Notes (Signed)
Called patient, AVS discussed. No questions endorsed. AVS information sent via mychart.

## 2019-01-05 NOTE — Progress Notes (Signed)
Heart Failure TeleHealth Note  Due to national recommendations of social distancing due to [name], telehealth visit is felt to be most appropriate for this patient at this time.  I discussed the limitations, risks, security and privacy concerns of performing an evaluation and management service by telephone and the availability of in person appointments. I also discussed with the patient that there may be a patient responsible charge related to this service. The patient expressed understanding and agreed to proceed.   ID:  Daria, Mcmeekin [birth year], MRN [id number]  Location: Home  Provider location: [place] Type of Visit: Established patient   PCP:  Fayrene Helper, MD  Cardiologist:  No primary care provider on file. Primary HF: Dr Haroldine Laws  Chief Complaint: HF follow up   History of Present Illness: [name] is a 53 y.o. female with a history of CAD s/p RCA stenting in 6144, severe systolic HF due to mixed cardiomyopathy EF , PVCs, recurrent VT s/p MDT ICD, SLE, GERD and ongoing tobacco use.   In November 2019 she was intolerant generic amiodarone due to the dye. She was later able to tolerate amiodarone with a different color tablet.   She has been followed by Dr Caryl Comes and noted to have PVC suppression with amiodarone and mexilitene.   She presents via Engineer, civil (consulting) for a telehealth visit today. Dr Caryl Comes stopped her mexiletine last week due to side effects. She also had TSH checked today, which is pending. Overall doing fine. She remains active. Denies SOB, edema, orthopnea, or PND. Feels a little "unbalanced" since stopping mexilitine, but back burning is much improved. No palpitations. No CP or dizziness. Taking all medications. Takes additional lasix occasionally for swelling- took 3x last week. Weight 211 lbs last week, now back to dry weight of 205 lbs. SBP 110s.   She denies symptoms worrisome for COVID  Day.  ECHO 09/2018  EF 30-35% , dilated cardiomyopathy, RV normal, MV moderate thickening of mitral valve leaflet   RHC 06/29/2018  RA = 5 RV = 34/5 PA = 40/11 (21) PCW = 9 Fick cardiac output/index = 4.3/2.1 PVR = 2.8 WU Ao sat = 93% PA sat = 48%, 56% LHC 11/7/Day  Dist LM lesion is 65% stenosed.  Prox RCA to Mid RCA lesion is 100% stenosed.  Ost Ramus lesion is 60% stenosed.  Non-stenotic Ramus lesion. Prox LAD lesion is 70% stenosed   Past Medical History:  Diagnosis Date  . Arthritis of knee   . Automatic implantable cardiac defibrillator in situ    a. s/p prior Medtronic ICD with 6949 lead. b. s/p Gen change & lead revision 05/2013.  . Cardiomyopathy secondary    Patient has cardiomyopathy out of proportion to her ischemic heart disease  . Chronic pain   . coronary artery disease    RCA stenting Myoview 2011 EF 30% infarction dilatation without ischemia  . Coronary artery disease   . Depression   . Diabetes mellitus   . GERD (gastroesophageal reflux disease)   . Insomnia   . Lupus (systemic lupus erythematosus) ([place])   . Membranous glomerulonephritis   . Migraines   . Myocardial infarction ([place])    8 total   . Nicotine abuse   . Other and unspecified hyperlipidemia   . Paroxysmal VT ([place])   . Systolic CHF ([place])   . VF (ventricular fibrillation) ([place])    a. Hx appropriate ICD therapy for VF.   Past  Surgical History:  Procedure Laterality Date  . ABDOMINAL HYSTERECTOMY    . CHOLECYSTECTOMY    . COLONOSCOPY  07/15/2011   SLF: internal hemorrhoids/hyperplastic polyps in the rectum/tubular adenomaSURVEILLANCE Nov 2017  . defibrillator placed   2009 and 05/2013  . ICD GENERATOR CHANGE  06/17/2013   Dr Caryl Comes  . IMPLANTABLE CARDIOVERTER DEFIBRILLATOR (ICD) GENERATOR CHANGE Left 06/17/2013   Procedure: ICD GENERATOR CHANGE;  Surgeon: Deboraha Sprang, MD;  Location: [facility] CATH LAB;  Service: Cardiovascular;  Laterality: Left;  . LEAD REVISION N/A 06/17/2013    Procedure: LEAD REVISION;  Surgeon: Deboraha Sprang, MD;  Location: [facility] CATH LAB;  Service: Cardiovascular;  Laterality: N/A;  . LEFT HEART CATH AND CORONARY ANGIOGRAPHY N/A 06/24/2018   Procedure: LEFT HEART CATH AND CORONARY ANGIOGRAPHY;  Surgeon: Jolaine Artist, MD;  Location: [place] CV LAB;  Service: Cardiovascular;  Laterality: N/A;  . RIGHT HEART CATH N/A 06/29/2018   Procedure: RIGHT HEART CATH;  Surgeon: Jolaine Artist, MD;  Location: [place] CV LAB;  Service: Cardiovascular;  Laterality: N/A;  . ROTATOR CUFF REPAIR Right 2002     Current Outpatient Medications  Medication Sig Dispense Refill  . amiodarone (PACERONE) 200 MG tablet Take 1 tablet (200 mg total) by mouth 2 (two) times daily. 60 tablet 6  . aspirin 81 MG tablet Take 1 tablet (81 mg total) by mouth daily. 30 tablet 6  . atorvastatin (LIPITOR) 10 MG tablet Take 1 tablet (10 mg total) by mouth daily. 90 tablet 3  . carvedilol (COREG) 3.125 MG tablet TAKE (1) TABLET BY MOUTH TWICE DAILY. 180 tablet 0  . cholecalciferol (VITAMIN D) 1000 units tablet Take 1,000 Units by mouth daily.    . furosemide (LASIX) 40 MG tablet Take 2 tablets (80 mg total) by mouth daily for 30 days. Take 40 mg daily along with 40 mg extra on days you have more fluid (Patient taking differently: Take 40 mg by mouth daily. Take 40 mg daily along with 40 mg extra on days you have more fluid) 60 tablet 6  . losartan (COZAAR) 50 MG tablet Take 1 tablet (50 mg total) by mouth daily. 90 tablet 3  . magnesium oxide (MAG-OX) 400 MG tablet Take 0.5 tablets (200 mg total) by mouth daily. Take 1/2 tab (200 mg total) by mouth once daily. 45 tablet 0  . potassium chloride (K-DUR,KLOR-CON) 20 MEQ tablet Take 1 tablet (20 mEq total) by mouth daily. 30 tablet 6  . albuterol (PROVENTIL HFA;VENTOLIN HFA) 108 (90 Base) MCG/ACT inhaler Inhale 2 puffs into the lungs every 6 (six) hours as needed for wheezing or shortness of breath. 1 Inhaler 5  . ALPRAZolam  (XANAX) 1 MG tablet Take 1 tablet (1 mg total) by mouth at bedtime. 30 tablet 5  . cyclobenzaprine (FLEXERIL) 5 MG tablet Take 1 tablet (5 mg total) by mouth at bedtime. 30 tablet 3  . famotidine (PEPCID) 20 MG tablet Take 1 tablet (20 mg total) by mouth 2 (two) times daily. 60 tablet 0  . fenofibrate (TRICOR) 145 MG tablet Take 1 tablet (145 mg total) by mouth daily. 90 tablet 3  . montelukast (SINGULAIR) 10 MG tablet Take 1 tablet (10 mg total) by mouth at bedtime. 30 tablet 3  . oxyCODONE (ROXICODONE) 15 MG immediate release tablet Take 15 mg by mouth every 8 (eight) hours.     . pantoprazole (PROTONIX) 40 MG tablet Take 1 tablet (40 mg total) by mouth daily. 30 tablet 6  No current facility-administered medications for this encounter.     Allergies:   Amiodarone; Bee venom; Cortisone; Mexiletine; Plaquenil [hydroxychloroquine]; Robaxin [methocarbamol]; and Tape   Social History:  The patient  reports that she quit smoking about 7 months ago. Her smoking use included cigarettes. She has a 7.50 pack-year smoking history. She has never used smokeless tobacco. She reports current alcohol use. She reports that she does not use drugs.   Family History:  The patient's family history includes Cancer in her mother; Dementia in her father; Diabetes in her brother, mother, and sister; HIV in her sister; Heart disease in her brother and mother; Hepatitis (age of onset: 102) in her mother; Hyperlipidemia in her brother, mother, and sister; Liver cancer (age of onset: 23) in her mother; Stroke (age of onset: 51) in her sister.   ROS:  Please see the history of present illness.   All other systems are personally reviewed and negative.    Exam:  (Video/Tele Health Call; Exam is subjective and or/visual.) General:  Speaks in full sentences. No resp difficulty. Lungs: Normal respiratory effort with conversation.  Abdomen: No distension per patient report Extremities: Pt denies edema. Neuro: Alert &  oriented x 3.   Recent Labs: 07/05/2018: Hemoglobin 11.9; Platelets 362 07/28/2018: B Natriuretic Peptide 138.9 08/24/2018: TSH 3.293 12/13/2018: ALT 23; BUN 14; Creat 0.96; Magnesium 1.8; Potassium 3.8; Sodium 141  Personally reviewed   Wt Readings from Last 3 Encounters:  01/05/19 93 kg (205 lb)  12/31/18 95.8 kg (211 lb 3.2 oz)  11/22/18 94.8 kg (209 lb)      ASSESSMENT AND PLAN:   1. Chronic systolic HF - She has long-standing mixed ischemic and NICM. EF 20-25% by echo 8/18. 13% by Myoview 11/18. Echo 03/2018 15-20%---> ECHO 09/2018 25-30% per Dr Haroldine Laws NYHA II.Volume sounds stable. Goal weight is 205 lbs.  - Continue lasix 40 mg dailywith extra 40 mg as needed.Recent labs were stable.  -Continue losartan to 50mg  daily.  - Continue carvedilol 3.125 mg twice a day + digoxin 0.125 mg daily - Hold off on spiro for now with hyper/hypokalemia.  - Repeat echo next visit  2. CAD - s/p remote RCA stent.  06/2018 LHC-Dist LM lesion is 65% stenosed.  Prox RCA to Mid RCA lesion is 100% stenosed.  Ost Ramus lesion is 60% stenosed.  Non-stenotic Ramus lesion.  Prox LAD lesion is 70% stenosed. - Continue ASA and statin. - Possible CABG if EF improves with PVC suppression.  - No CP  3. HTN - SBP 110s.   4. Tobacco use - No longer smoking.   5. VT/Ferequent PVCs ICD shock 2019 due to electrolyte imbalance. S/P MDT ICD - Tolerating brand amiodarone 200 mg BID. LFTs stable last month. TSH pending from today.  - Followed by Dr Caryl Comes. Mexilitine stopped due to side effects (burning back pain), now improved.  - Denies palpitations and she could feel before. She knows to call us if palpitations come back.    6. SLE - Followed by Rhe[facility]ologist at [facility]. Has been quiescent for many years.  - No change.    COVID screen The patient does not have any symptoms that suggest any further testing/ screening at this time.  Social distancing reinforced today.   Patient Risk: After full review of this patients clinical status, I feel that they are at moderate risk for cardiac decompensation at this time.  Orders/Follow up: Follow up with Dr Haroldine Laws in 2-3 months with echo.   Today, I  have spent 12 minutes with the patient with telehealth technology discussing CHF.    Signed, Georgiana Shore, NP  01/05/2019 2:58 PM   [facility] [address] Heart and [place] [address] [phone] (office) [phone] (fax)

## 2019-01-05 NOTE — Patient Instructions (Addendum)
No medication changes today!!  Your physician recommends that you schedule a follow-up appointment in: 2-3 months with Dr Haroldine Laws and an ECHO  Your physician has requested that you have an echocardiogram. Echocardiography is a painless test that uses sound waves to create images of your heart. It provides your doctor with information about the size and shape of your heart and how well your heart's chambers and valves are working. This procedure takes approximately one hour. There are no restrictions for this procedure.

## 2019-01-17 ENCOUNTER — Telehealth: Payer: Self-pay

## 2019-01-17 DIAGNOSIS — R7989 Other specified abnormal findings of blood chemistry: Secondary | ICD-10-CM

## 2019-01-17 NOTE — Telephone Encounter (Signed)
Pt aware of TSH results. She will have it rechecked Sept 5.

## 2019-01-17 NOTE — Telephone Encounter (Signed)
-----   Message from Deboraha Sprang, MD sent at 01/13/2019  9:42 PM EDT ----- Please Inform Patient  TSH is mildly elevated -- just barely   We will recheck it in 3 months #  Thanks

## 2019-01-24 ENCOUNTER — Ambulatory Visit (INDEPENDENT_AMBULATORY_CARE_PROVIDER_SITE_OTHER): Payer: Medicare Other | Admitting: *Deleted

## 2019-01-24 DIAGNOSIS — I428 Other cardiomyopathies: Secondary | ICD-10-CM | POA: Diagnosis not present

## 2019-01-24 DIAGNOSIS — I472 Ventricular tachycardia: Secondary | ICD-10-CM

## 2019-01-24 DIAGNOSIS — I4729 Other ventricular tachycardia: Secondary | ICD-10-CM

## 2019-01-24 LAB — CUP PACEART REMOTE DEVICE CHECK
Battery Remaining Longevity: 72 mo
Battery Voltage: 2.99 V
Brady Statistic RV Percent Paced: 0.01 %
Date Time Interrogation Session: 20200608052403
HighPow Impedance: 68 Ohm
Implantable Lead Implant Date: 20141031
Implantable Lead Location: 753860
Implantable Lead Model: 6935
Implantable Pulse Generator Implant Date: 20141031
Lead Channel Impedance Value: 361 Ohm
Lead Channel Impedance Value: 418 Ohm
Lead Channel Pacing Threshold Amplitude: 0.625 V
Lead Channel Pacing Threshold Pulse Width: 0.4 ms
Lead Channel Sensing Intrinsic Amplitude: 6.625 mV
Lead Channel Sensing Intrinsic Amplitude: 6.625 mV
Lead Channel Setting Pacing Amplitude: 2.5 V
Lead Channel Setting Pacing Pulse Width: 0.4 ms
Lead Channel Setting Sensing Sensitivity: 0.3 mV

## 2019-01-25 ENCOUNTER — Ambulatory Visit (INDEPENDENT_AMBULATORY_CARE_PROVIDER_SITE_OTHER): Payer: Medicare Other

## 2019-01-25 DIAGNOSIS — T827XXA Infection and inflammatory reaction due to other cardiac and vascular devices, implants and grafts, initial encounter: Secondary | ICD-10-CM

## 2019-01-25 DIAGNOSIS — I5022 Chronic systolic (congestive) heart failure: Secondary | ICD-10-CM | POA: Diagnosis not present

## 2019-01-28 ENCOUNTER — Telehealth: Payer: Self-pay

## 2019-01-28 NOTE — Telephone Encounter (Signed)
  ICM monthly call to patient.  Optivol Transmission suggests fluid accumulation ongoing since 01/04/2019.   Sx: Reporting significant swelling of feet and legs.  Difficulty wearing shoes.       Weight has increased 5 lbs from baseline.     MEDS: She has been taking Furosemide 2 tablets (80 mg total) daily since ~5/20 and the extra tablet does increase urine output but she has little output with prescribed dosage of 1 tablet (40 mg daily.   DIET: She is limiting salt intake   Labs: 12/13/2018 Creatinine 0.96, BUN 14, Potassium 3.8, Sodium 141, GFR 67-78 09/03/2018 Creatinine0.85, BUN6, Potassium 4.3, Sodium139, GFR>60 A complete set of results can be found in Results Review.  Recommendations:Advised will call back with any recommendations from Dr Haroldine Laws  Follow-up plan: ICM clinic phone appointment on6/17/2020 (manual send) to recheck fluid levels.  Copy of ICM check sent to Dr.Klein and Dr Haroldine Laws (also sent in phone note).   3 month ICM trend: 01/25/2019    1 Year ICM trend:

## 2019-01-28 NOTE — Telephone Encounter (Signed)
Remote ICM transmission received.  Attempted call to patient regarding ICM remote transmission and left detailed message, per DPR, to return call.    

## 2019-01-28 NOTE — Progress Notes (Signed)
EPIC Encounter for ICM Monitoring  Patient Name: [name] is a 53 y.o. female Date: 01/28/2019 Primary Care Physican: Fayrene Helper, MD Primary Cardiologist: [place] Electrophysiologist: Caryl Comes 3/24/2020Weight:205lbs 12/14/2018 Weight:210lbs 01/28/2019 215 today (219 lbs   Spoke with patient. She reports feet and legs are very swollen.  Weight has increased 5 lbs from baseline.  She has been taking Furosemide 2 tablets (80 mg total) daily since ~5/20 and the extra pill does increase urine output.   She reports limiting salt intake.    Optivol Thoracic impedance abnormal suggesting possible fluid accumulation ongoing since 01/04/2019.  Prescribed:Furosemide40 mgTake1 tablet (40 mgtotal)daily along with 40 mg extra on days you have more fluid.  Labs: 12/13/2018 Creatinine 0.96, BUN 14, Potassium 3.8, Sodium 141, GFR 67-78 09/03/2018 Creatinine0.85, BUN6, Potassium 4.3, Sodium139, GFR>60 12/19/2019Creatinine 1.32, BUN15, Potassium4.1, Sodium136, [id number]  12/11/2019Creatinine 0.82, BUN10, Potassium3.2, Sodium140, GFR>60  12/05/2019Creatinine 0.82, BUN10, Potassium3.2, Sodium140, GFR>60  07/22/2018 Creatinine0.92, BUN13, Potassium3.9, Sodium139, GFR>60  07/09/2018 Creatinine1.19, BUN13, Potassium4.4, Sodium136, [id number]  07/08/2018 Creatinine1.12, BUN11, Potassium4.4, [id number], GFR55->60 A complete set of results can be found in Results Review.  Recommendations:Advised will call back with any recommendations from Dr Haroldine Laws  Follow-up plan: ICM clinic phone appointment on6/17/2020 (manual send) to recheck fluid levels.  Copy of ICM check sent to Dr.Klein and Dr Haroldine Laws (also sent in phone note).   3 month ICM trend: 01/25/2019    1 Year ICM trend:       [name], RN 01/28/2019 4:04 PM

## 2019-01-30 NOTE — Telephone Encounter (Signed)
Please give 5mg  metolazone and 40 kcl and assess response

## 2019-01-31 MED ORDER — METOLAZONE 5 MG PO TABS: 5.0000 mg | ORAL_TABLET | Freq: Every day | ORAL | 0 refills | Status: DC

## 2019-01-31 NOTE — Progress Notes (Signed)
Remote ICD transmission.   

## 2019-01-31 NOTE — Progress Notes (Signed)
  Per Phone note   Bensimhon, Shaune Pascal, MD  Physician  Specialty:  Cardiology  Telephone Encounter  Signed  Creation Time:  01/30/2019 3:08 PM          Signed        Please give 5mg  metolazone and 40 kcl and assess response

## 2019-01-31 NOTE — Progress Notes (Signed)
Call to patient.  Her weight remains ~5 lbs above baseline and today was 215 lbs.  Feet are still significantly swollen.  No improvement of symptoms over the weekend.  Advised Dr Haroldine Laws ordered Metolazone 5 mg 1 tablet (one time dose) by mouth along with additional 52mEq of Potassium 30 minutes prior to prescribed Furosemide dosage of 40 mg.  She verbalized understanding and encouraged to call back for any questions.  Confirmed preferred pharmacy.  Will recheck remote transmission fluid levels 6/17 (manual send).

## 2019-01-31 NOTE — Telephone Encounter (Signed)
Call to patient.  Her weight remains ~5 lbs above baseline and today was 215 lbs.  Feet are still significantly swollen.  No improvement of symptoms over the weekend.  Advised Dr Haroldine Laws ordered Metolazone 5 mg 1 tablet (one time dose) by mouth along with additional 62mEq of Potassium 30 minutes prior to prescribed Furosemide dosage of 40 mg.  She verbalized understanding and encouraged to call back for any questions.  Confirmed preferred pharmacy.  Will recheck remote transmission fluid levels 6/17 (manual send).

## 2019-02-02 ENCOUNTER — Telehealth: Payer: Self-pay

## 2019-02-02 ENCOUNTER — Ambulatory Visit (INDEPENDENT_AMBULATORY_CARE_PROVIDER_SITE_OTHER): Payer: Medicare Other

## 2019-02-02 DIAGNOSIS — T827XXA Infection and inflammatory reaction due to other cardiac and vascular devices, implants and grafts, initial encounter: Secondary | ICD-10-CM

## 2019-02-02 DIAGNOSIS — I5022 Chronic systolic (congestive) heart failure: Secondary | ICD-10-CM

## 2019-02-02 MED ORDER — METOLAZONE 5 MG PO TABS: ORAL_TABLET | ORAL | 0 refills | Status: AC

## 2019-02-02 NOTE — Telephone Encounter (Signed)
Call to patient.  Advised Dr Haroldine Laws recommended she repeat dosage of Metolazone 5 mg 1 tablet with additional 40 mEq.  She verbalized understanding and repeated instructions.  Advised will order 2 extra tablets for future but should never take a dose without direction of physicians because Metolazone can decrease Potassium levels to below normal possibly causing a defibrillator shock.

## 2019-02-02 NOTE — Progress Notes (Signed)
Call to patient.  Advised Dr Haroldine Laws recommended she repeat dosage of Metolazone 5 mg 1 tablet with additional 40 mEq.  She verbalized understanding and repeated instructions.  Advised will order 2 extra tablets for future but should never take a dose without direction of physicians because Metolazone can decrease Potassium levels to below normal causing a defibrillator shock.

## 2019-02-02 NOTE — Progress Notes (Signed)
  Bensimhon, Shaune Pascal, MD  Physician  Specialty:  Cardiology  Telephone Encounter  Signed  Creation Time:  02/02/2019 2:53 PM          Signed        Lets have her take another metoalzone with kcl 40 x 1  thanks

## 2019-02-02 NOTE — Telephone Encounter (Signed)
Lets have her take another metoalzone with kcl 40 x 1  thanks

## 2019-02-02 NOTE — Telephone Encounter (Signed)
Spoke with patient.  She took one time Metolazone 5 mg with additional 89mEq of Potassium on 6/16. Urination increased significantly.     She reports losing 6 lbs and swelling of feet/legs have resolved since taking Metolazone on 6/16.    Optivol Thoracic impedance remains abnormal suggesting possible fluid accumulation ongoing since 12/27/2018.   Prescribed:Furosemide40 mgTake1 tablet (40 mgtotal)daily along with 40 mg extra on days you have more fluid.Potassium 20 mEq 1 tablet daily.  Labs: 12/13/2018 Creatinine 0.96, BUN 14, Potassium 3.8, Sodium 141, GFR 67-78 09/03/2018 Creatinine0.85, BUN6, Potassium 4.3, Sodium139, GFR>60 12/19/2019Creatinine 1.32, BUN15, Potassium4.1, Sodium136, [id number]  A complete set of results can be found in Results Review.  Recommendations:Advised will call back with any recommendations from Dr Haroldine Laws  Follow-up plan: ICM clinic phone appointment on6/23/2020 to recheck fluid levels.  3 month ICM trend: 02/02/2019    1 Year ICM trend:

## 2019-02-02 NOTE — Progress Notes (Signed)
EPIC Encounter for ICM Monitoring  Patient Name: [name] is a 53 y.o. female Date: 02/02/2019 Primary Care Physican: Fayrene Helper, MD Primary Cardiologist: B[facility] Electrophysiologist: Caryl Comes 12/14/2018 Weight:210lbs 01/28/2019 Weight: 219 lbs 02/02/2019 Weight: 213 lbs   Spoke with patient.  She took one time Metolazone 5 mg with additional 27mEq of Potassium on 6/16. Urination increased significantly.        She reports losing 6 lbs and swelling of feet/legs have resolved since taking Metolazone on 6/16.    Optivol Thoracic impedance remains abnormal suggesting possible fluid accumulation ongoing since 12/27/2018.   Prescribed:Furosemide40 mgTake1 tablet (40 mgtotal)daily along with 40 mg extra on days you have more fluid.Potassium 20 mEq take 1 tablet daily.  Labs: 12/13/2018 Creatinine 0.96, BUN 14, Potassium 3.8, Sodium 141, GFR 67-78 09/03/2018 Creatinine0.85, BUN6, Potassium 4.3, Sodium139, GFR>60 12/19/2019Creatinine 1.32, BUN15, Potassium4.1, Sodium136, [id number]  A complete set of results can be found in Results Review.  Recommendations:Advised will call back with any recommendations from Dr Haroldine Laws  Follow-up plan: ICM clinic phone appointment on6/23/2020 to recheck fluid levels.  Copy of ICM check sent to Dr.Klein and Dr Haroldine Laws (routed review in phone note).    3 month ICM trend: 02/02/2019    1 Year ICM trend:       [name], RN 02/02/2019 1:01 PM

## 2019-02-02 NOTE — Telephone Encounter (Signed)
Spoke with patient to remind of missed remote transmission 

## 2019-02-08 ENCOUNTER — Ambulatory Visit (INDEPENDENT_AMBULATORY_CARE_PROVIDER_SITE_OTHER): Payer: Medicare Other

## 2019-02-08 ENCOUNTER — Other Ambulatory Visit (HOSPITAL_COMMUNITY): Payer: Self-pay | Admitting: Internal Medicine

## 2019-02-08 DIAGNOSIS — I5022 Chronic systolic (congestive) heart failure: Secondary | ICD-10-CM

## 2019-02-08 DIAGNOSIS — T827XXA Infection and inflammatory reaction due to other cardiac and vascular devices, implants and grafts, initial encounter: Secondary | ICD-10-CM

## 2019-02-08 NOTE — Progress Notes (Signed)
EPIC Encounter for ICM Monitoring  Patient Name: Noele Icenhour is a 53 y.o. female Date: 02/08/2019 Primary Care Physican: Fayrene Helper, MD Primary Cardiologist: [facility] Electrophysiologist: Caryl Comes 12/14/2018 Weight:210lbs 01/28/2019 Weight: 219 lbs 02/02/2019 Weight: 213 lbs 02/08/2019 Weight: 205 lbs   Spoke with patient.  She feels so much better after taking 2nd dose of Metolazone and no more swelling in lets. Weight decreased by 8 lbs since 6/17.        OptivolThoracic impedance  returned to normal after taking 2 doses of Metolazone.   Advised do not take any further doses of Metolazone without direction from Dr Haroldine Laws.  Prescribed:Furosemide40 mgTake1 tablet (40 mgtotal)daily along with 40 mg extra on days you have more fluid.Potassium 20 mEq take 1 tablet daily.  Labs: 12/13/2018 Creatinine 0.96, BUN 14, Potassium 3.8, Sodium 141, GFR 67-78 09/03/2018 Creatinine0.85, BUN6, Potassium 4.3, Sodium139, GFR>60 12/19/2019Creatinine 1.32, BUN15, Potassium4.1, Sodium136, [id number]  A complete set of results can be found in Results Review.  Recommendations:No changes and encouraged to call if fluid symptoms return.   Follow-up plan: ICM clinic phone appointment on7/20/2020  Copy of ICM check sent to Dr.Kleinand Dr Haroldine Laws  3 month ICM trend: 02/08/2019    1 Year ICM trend:       [name], RN 02/08/2019 3:54 PM

## 2019-02-21 ENCOUNTER — Ambulatory Visit (HOSPITAL_COMMUNITY): Payer: Medicare Other

## 2019-03-01 ENCOUNTER — Other Ambulatory Visit (HOSPITAL_COMMUNITY): Payer: Self-pay | Admitting: Internal Medicine

## 2019-03-01 ENCOUNTER — Other Ambulatory Visit: Payer: Self-pay | Admitting: *Deleted

## 2019-03-01 ENCOUNTER — Other Ambulatory Visit: Payer: Self-pay | Admitting: Family Medicine

## 2019-03-01 DIAGNOSIS — K219 Gastro-esophageal reflux disease without esophagitis: Secondary | ICD-10-CM

## 2019-03-01 NOTE — Patient Outreach (Signed)
Referral received from [employer] high risk list, outreach call to pt, no answer to telephone, no option to leave voicemail.  RN CM mailed unsuccessful outreach letter to pt home.  PLAN Outreach pt in 3-4 business days  Jacqlyn Larsen [facility], [place] [phone]

## 2019-03-04 ENCOUNTER — Other Ambulatory Visit: Payer: Self-pay | Admitting: *Deleted

## 2019-03-04 NOTE — Patient Outreach (Signed)
Outreach call for screening/  2nd attempt, no answer to telephone and no option to leave voicemail.  PLAN Outreach pt in 3-4 business days  Jacqlyn Larsen [facility], [place] [phone]

## 2019-03-07 ENCOUNTER — Ambulatory Visit (INDEPENDENT_AMBULATORY_CARE_PROVIDER_SITE_OTHER): Payer: Medicare Other

## 2019-03-07 DIAGNOSIS — Z95810 Presence of automatic (implantable) cardiac defibrillator: Secondary | ICD-10-CM | POA: Diagnosis not present

## 2019-03-07 DIAGNOSIS — I5022 Chronic systolic (congestive) heart failure: Secondary | ICD-10-CM

## 2019-03-07 NOTE — Progress Notes (Signed)
EPIC Encounter for ICM Monitoring  Patient Name: [name] is a 53 y.o. female Date: 03/07/2019 Primary Care Physican: Fayrene Helper, MD Primary Cardiologist: B[place] Electrophysiologist: Caryl Comes 02/02/2019 Weight:213lbs 02/08/2019 Weight: 205 lbs 03/06/2019 Weight: 219.8 lbs 03/08/2019 Weight: 216 lbs    Spoke with patient.She symptomatic with hand swelling and 12-15 pounds over baseline weight of 205 lbs.    OptivolThoracic impedance suggesting possible ongoing fluid accumulation since 02/21/2019.  Prescribed:Furosemide40 mgTake1 tablet (40 mgtotal)daily along with 40 mg extra on days you have more fluid.Potassium 20 mEq take 1 tablet daily.  Labs: 12/13/2018 Creatinine 0.96, BUN 14, Potassium 3.8, Sodium 141, GFR 67-78 09/03/2018 Creatinine0.85, BUN6, Potassium 4.3, Sodium139, GFR>60 12/19/2019Creatinine 1.32, BUN15, Potassium4.1, Sodium136, [id number]  A complete set of results can be found in Results Review.  Recommendations:She has taken Furosemide 80 mg several days in the past week and will take again for another 3 days.  Advised to call if weight does not decrease or condition worsens.   Follow-up plan: ICM clinic phone appointment on8/10/2018 to recheck fluid levels.  Copy of ICM check sent to Dr.Kleinand Dr Haroldine Laws.   3 month ICM trend: 03/07/2019    1 Year ICM trend:       Rachel Billings, RN 03/07/2019 2:08 PM

## 2019-03-09 ENCOUNTER — Other Ambulatory Visit: Payer: Self-pay | Admitting: *Deleted

## 2019-03-09 NOTE — Patient Outreach (Signed)
Outreach call to pt for screening (2nd attempt), no answer to telephone and no option to leave voicemail.  PLAN Outreach pt in 3-4 business days  Mclain Freer RNC, BSN [facility] Coordinator [phone]   

## 2019-03-15 ENCOUNTER — Other Ambulatory Visit: Payer: Self-pay | Admitting: *Deleted

## 2019-03-15 NOTE — Patient Outreach (Addendum)
Referral received from [employer] high risk list, outreach call to pt for screening, spoke with pt, HIPAA verified, (pt was previously discharged from [facility] program), pt states since she was recently in [facility] program a few months ago she does not feel anything has changed for her and does not wish to participate in the program.  Pt states "people call me all the time and I'm fine, I don't need a babysitter"  Pt adamantly refused program and refused screening. Late entry 03/16/19-  RN CM mailed successful outreach letter to pt home including 24 hour nurse line magnet and [facility] pamphlet.  Close case  [name] [facility], [place] Coordinator [phone]

## 2019-03-21 ENCOUNTER — Ambulatory Visit (INDEPENDENT_AMBULATORY_CARE_PROVIDER_SITE_OTHER): Payer: Medicare Other

## 2019-03-21 DIAGNOSIS — Z95810 Presence of automatic (implantable) cardiac defibrillator: Secondary | ICD-10-CM

## 2019-03-21 DIAGNOSIS — I5022 Chronic systolic (congestive) heart failure: Secondary | ICD-10-CM

## 2019-03-22 ENCOUNTER — Telehealth: Payer: Self-pay

## 2019-03-22 NOTE — Progress Notes (Signed)
EPIC Encounter for ICM Monitoring  Patient Name: [name] is a 53 y.o. female Date: 03/22/2019 Primary Care Physican: Fayrene Helper, MD Primary Cardiologist: [place] Electrophysiologist: Caryl Comes 02/08/2019 Weight: 205 lbs 03/06/2019 Weight: 219.8 lbs 03/08/2019 Weight: 216 lbs  03/22/2019 Weight: 213 lbs   Spoke with patient.She reports less hand and feet swelling.  Weight decreased but she still thinks her baseline weight is 205 lbs.     OptivolThoracic impedance returned to normal since 03/07/2019 but does suggest fluid may be starting to accumulate again in the past 2 days.   Prescribed:Furosemide40 mgTake1 tablet (40 mgtotal)daily along with 40 mg extra on days you have more fluid.Potassium 20 mEq take 1 tablet daily.  Labs: 12/13/2018 Creatinine 0.96, BUN 14, Potassium 3.8, Sodium 141, GFR 67-78 09/03/2018 Creatinine0.85, BUN6, Potassium 4.3, Sodium139, GFR>60 12/19/2019Creatinine 1.32, BUN15, Potassium4.1, Sodium136, [id number]  A complete set of results can be found in Results Review.  Recommendations: Advised to call if she experiences any worsening fluid symptoms and encouraged to discuss fluid symptoms and Furosemide dosage at OV 8/20 with Dr Haroldine Laws.   Follow-up plan: ICM clinic phone appointment on9/04/2019.  OV with Dr Haroldine Laws 04/07/2019.  Copy of ICM check sent to [place].  3 month ICM trend: 03/22/2019    1 Year ICM trend:       [name], RN 03/22/2019 4:06 PM

## 2019-03-22 NOTE — Telephone Encounter (Signed)
Left message for patient to remind of missed remote transmission.  

## 2019-04-07 ENCOUNTER — Ambulatory Visit (HOSPITAL_COMMUNITY): Admission: RE | Admit: 2019-04-07 | Payer: Medicare Other | Source: Ambulatory Visit

## 2019-04-07 ENCOUNTER — Encounter (HOSPITAL_COMMUNITY): Payer: Medicare Other | Admitting: Internal Medicine

## 2019-04-13 ENCOUNTER — Encounter: Payer: Self-pay | Admitting: Family Medicine

## 2019-04-13 ENCOUNTER — Ambulatory Visit (INDEPENDENT_AMBULATORY_CARE_PROVIDER_SITE_OTHER): Payer: Medicare Other | Admitting: Family Medicine

## 2019-04-13 ENCOUNTER — Other Ambulatory Visit: Payer: Self-pay

## 2019-04-13 VITALS — BP 103/71 | HR 72 | Resp 12 | Ht 66.0 in | Wt 215.0 lb

## 2019-04-13 DIAGNOSIS — E785 Hyperlipidemia, unspecified: Secondary | ICD-10-CM

## 2019-04-13 DIAGNOSIS — Z1231 Encounter for screening mammogram for malignant neoplasm of breast: Secondary | ICD-10-CM

## 2019-04-13 DIAGNOSIS — I10 Essential (primary) hypertension: Secondary | ICD-10-CM

## 2019-04-13 DIAGNOSIS — E559 Vitamin D deficiency, unspecified: Secondary | ICD-10-CM

## 2019-04-13 DIAGNOSIS — Z0000 Encounter for general adult medical examination without abnormal findings: Secondary | ICD-10-CM | POA: Diagnosis not present

## 2019-04-13 DIAGNOSIS — Z23 Encounter for immunization: Secondary | ICD-10-CM

## 2019-04-13 DIAGNOSIS — B351 Tinea unguium: Secondary | ICD-10-CM

## 2019-04-13 DIAGNOSIS — R7301 Impaired fasting glucose: Secondary | ICD-10-CM

## 2019-04-13 MED ORDER — PANTOPRAZOLE SODIUM 40 MG PO TBEC: 40.0000 mg | DELAYED_RELEASE_TABLET | Freq: Every day | ORAL | 5 refills | Status: AC

## 2019-04-13 MED ORDER — MONTELUKAST SODIUM 10 MG PO TABS: 10.0000 mg | ORAL_TABLET | Freq: Every day | ORAL | 3 refills | Status: AC

## 2019-04-13 MED ORDER — ALPRAZOLAM 1 MG PO TABS: 1.0000 mg | ORAL_TABLET | Freq: Every evening | ORAL | 4 refills | Status: AC | PRN

## 2019-04-13 MED ORDER — TERBINAFINE HCL 250 MG PO TABS: 250.0000 mg | ORAL_TABLET | Freq: Every day | ORAL | 1 refills | Status: AC

## 2019-04-13 NOTE — Patient Instructions (Signed)
F/U with MD 3rd week in January, call if you need me sooner  Please schedule mammogram at checkout, breast center.  Congrats on remining nicotine free  12 weeks medication sent for toenails  Please get fasting lipid, cmp and EGFr, hBA1C, vit D with lab order at Dr Danie Chandler office in September  It is important that you exercise regularly at least 30 minutes 5 times a week. If you develop chest pain, have severe difficulty breathing, or feel very tired, stop exercising immediately and seek medical attention  Think about what you will eat, plan ahead. Choose " clean, green, fresh or frozen" over canned, processed or packaged foods which are more sugary, salty and fatty. 70 to 75% of food eaten should be vegetables and fruit. Three meals at set times with snacks allowed between meals, but they must be fruit or vegetables. Aim to eat over a 12 hour period , example 7 am to 7 pm, and STOP after  your last meal of the day. Drink water,generally about 64 ounces per day, no other drink is as healthy. Fruit juice is best enjoyed in a healthy way, by EATING the fruit.  Thanks for choosing [facility], we consider it a privelige to serve you.

## 2019-04-16 ENCOUNTER — Encounter: Payer: Self-pay | Admitting: Family Medicine

## 2019-04-16 NOTE — Assessment & Plan Note (Signed)

## 2019-04-16 NOTE — Assessment & Plan Note (Signed)
Terbinafine prescrfibed

## 2019-04-16 NOTE — Progress Notes (Signed)
    [name]     MRN: [id number]      DOB: [birth year]  HPI: Patient is in for annual physical exam. No other health concerns are expressed or addressed at the visit. Recent labs, if available are reviewed. Immunization is reviewed , and  updated if needed.   PE: BP 103/71   Pulse 72   Resp 12   Ht 5\' 6"  (1.676 m)   Wt 215 lb 0.6 oz (97.5 kg)   SpO2 95%   BMI 34.71 kg/m   Pleasant  female, alert and oriented x 3, in no cardio-pulmonary distress. Afebrile. HEENT No facial trauma or asymetry. Sinuses non tender.  Extra occullar muscles intact, External ears normal,  Neck: supple, no adenopathy,JVD or thyromegaly.No bruits.  Chest: Clear to ascultation bilaterally.No crackles or wheezes. Non tender to palpation  Breast: Not examined Cardiovascular system; Heart sounds normal,  S1 and  S2 ,no S3.  No murmur, or thrill. Apical beat not displaced Peripheral pulses normal.  Abdomen: Soft, non tender, no organomegaly or masses. No guarding, tenderness or rebound.    GU: Not examined  Musculoskeletal exam: Decreased though adequate  ROM of spine, hips , shoulders and knees. No deformity ,swelling or crepitus noted. No muscle wasting or atrophy.   Neurologic: Cranial nerves 2 to 12 intact. Power, tone ,sensation  normal throughout. No disturbance in gait. No tremor.  Skin: Intact, no ulceration, erythema , scaling or rash noted. Pigmentation normal throughout  Psych; Normal mood and affect. Judgement and concentration normal   Assessment & Plan:  Onychomycosis Terbinafine prescrfibed  Annual physical exam Annual exam as documented. Counseling done  re healthy lifestyle involving commitment to 150 minutes exercise per week, heart healthy diet, and attaining healthy weight.The importance of adequate sleep also discussed. Regular seat belt use and home safety, is also discussed. Changes in health habits are decided on by the patient with goals  and time frames  set for achieving them. Immunization and cancer screening needs are specifically addressed at this visit.

## 2019-04-22 ENCOUNTER — Other Ambulatory Visit: Payer: Self-pay

## 2019-04-22 ENCOUNTER — Other Ambulatory Visit: Payer: Medicare Other | Admitting: *Deleted

## 2019-04-22 DIAGNOSIS — R7989 Other specified abnormal findings of blood chemistry: Secondary | ICD-10-CM

## 2019-04-22 DIAGNOSIS — Z79899 Other long term (current) drug therapy: Secondary | ICD-10-CM | POA: Diagnosis not present

## 2019-04-22 DIAGNOSIS — Z1231 Encounter for screening mammogram for malignant neoplasm of breast: Secondary | ICD-10-CM

## 2019-04-23 LAB — TSH: TSH: 4.38 u[IU]/mL (ref 0.450–4.500)

## 2019-04-26 ENCOUNTER — Ambulatory Visit (INDEPENDENT_AMBULATORY_CARE_PROVIDER_SITE_OTHER): Payer: Medicare Other | Admitting: *Deleted

## 2019-04-26 DIAGNOSIS — I428 Other cardiomyopathies: Secondary | ICD-10-CM | POA: Diagnosis not present

## 2019-04-26 LAB — CUP PACEART REMOTE DEVICE CHECK
Battery Remaining Longevity: 69 mo
Battery Voltage: 2.99 V
Brady Statistic RV Percent Paced: 0.01 %
Date Time Interrogation Session: 20200908071606
HighPow Impedance: 68 Ohm
Implantable Lead Implant Date: 20141031
Implantable Lead Location: 753860
Implantable Lead Model: 6935
Implantable Pulse Generator Implant Date: 20141031
Lead Channel Impedance Value: 342 Ohm
Lead Channel Impedance Value: 418 Ohm
Lead Channel Pacing Threshold Amplitude: 0.875 V
Lead Channel Pacing Threshold Pulse Width: 0.4 ms
Lead Channel Sensing Intrinsic Amplitude: 8.375 mV
Lead Channel Sensing Intrinsic Amplitude: 8.375 mV
Lead Channel Setting Pacing Amplitude: 2.5 V
Lead Channel Setting Pacing Pulse Width: 0.4 ms
Lead Channel Setting Sensing Sensitivity: 0.3 mV

## 2019-04-27 ENCOUNTER — Ambulatory Visit (INDEPENDENT_AMBULATORY_CARE_PROVIDER_SITE_OTHER): Payer: Medicare Other

## 2019-04-27 DIAGNOSIS — I5022 Chronic systolic (congestive) heart failure: Secondary | ICD-10-CM | POA: Diagnosis not present

## 2019-04-27 DIAGNOSIS — Z95810 Presence of automatic (implantable) cardiac defibrillator: Secondary | ICD-10-CM

## 2019-04-29 ENCOUNTER — Telehealth: Payer: Self-pay

## 2019-04-29 NOTE — Progress Notes (Signed)
EPIC Encounter for ICM Monitoring  Patient Name: [name] is a 53 y.o. female Date: 04/29/2019 Primary Care Physican: Fayrene Helper, MD Primary Cardiologist: [place] Electrophysiologist: Caryl Comes 03/08/2019 Weight: 216 lbs 03/22/2019 Weight: 213 lbs   Attempted call to patient and unable to reach.  Left detailed message per DPR regarding transmission. Transmission reviewed.    OptivolThoracic impedancesuggesting possible ongoing fluid accumulation since 04/19/2019.   Prescribed:Furosemide40 mgTake1 tablet (40 mgtotal)daily along with 40 mg extra on days you have more fluid.Potassium 20 mEq take 1 tablet daily.  Labs: 12/13/2018 Creatinine 0.96, BUN 14, Potassium 3.8, Sodium 141, GFR 67-78 09/03/2018 Creatinine0.85, BUN6, Potassium 4.3, Sodium139, GFR>60 12/19/2019Creatinine 1.32, BUN15, Potassium4.1, Sodium136, [id number]  A complete set of results can be found in Results Review.  Recommendations: Left voice mail with ICM number and encouraged to call if experiencing any fluid symptoms.  If pt reached will advise to follow prescription instructions to take extra Furosemide.  Follow-up plan: ICM clinic phone appointment on 06/13/2019.   91 day device clinic remote transmission 07/26/2019.      Copy of ICM check sent to Dr. Caryl Comes and Dr Haroldine Laws.   3 month ICM trend: 04/26/2019    1 Year ICM trend:       [name], RN 04/29/2019 11:00 AM

## 2019-04-29 NOTE — Telephone Encounter (Signed)
Remote ICM transmission received.  Attempted call to patient regarding ICM remote transmission and left detailed message per DPR to return call.   

## 2019-05-11 ENCOUNTER — Encounter: Payer: Self-pay | Admitting: Cardiology

## 2019-05-11 NOTE — Progress Notes (Signed)
Remote ICD transmission.   

## 2019-05-16 ENCOUNTER — Other Ambulatory Visit: Payer: Self-pay | Admitting: Family Medicine

## 2019-05-16 ENCOUNTER — Other Ambulatory Visit (HOSPITAL_COMMUNITY): Payer: Self-pay | Admitting: Internal Medicine

## 2019-05-16 ENCOUNTER — Other Ambulatory Visit: Payer: Self-pay | Admitting: Internal Medicine

## 2019-05-16 DIAGNOSIS — M62838 Other muscle spasm: Secondary | ICD-10-CM

## 2019-05-25 IMAGING — CT CT ANGIO CHEST
3 of 7 series · 18 of 46 positions shown · IV contrast (iopamidol)
Comparison: Radiographs June 22, 2018. CT scan [DATE]

CLINICAL DATA: Chest pain.

EXAM:
CT ANGIOGRAPHY CHEST WITH CONTRAST
TECHNIQUE: Multidetector CT imaging of the chest was performed using the
standard protocol during bolus administration of intravenous
contrast. Multiplanar CT image reconstructions and MIPs were
obtained to evaluate the vascular anatomy.
CONTRAST:  100mL RDLL15-V64 IOPAMIDOL (RDLL15-V64) INJECTION 76%

[Series 7: arterial · axial · arterial · 0.77mm/px · z∈[+1160,+1384]mm · 11 of 136 slices shown]
[im 12/136  lung]
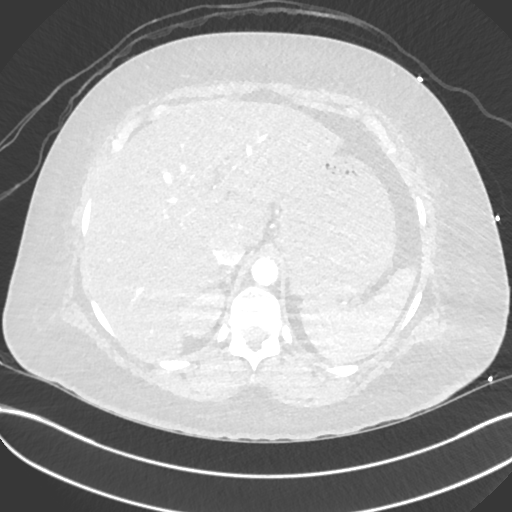
[im 23/136  soft-tissue]
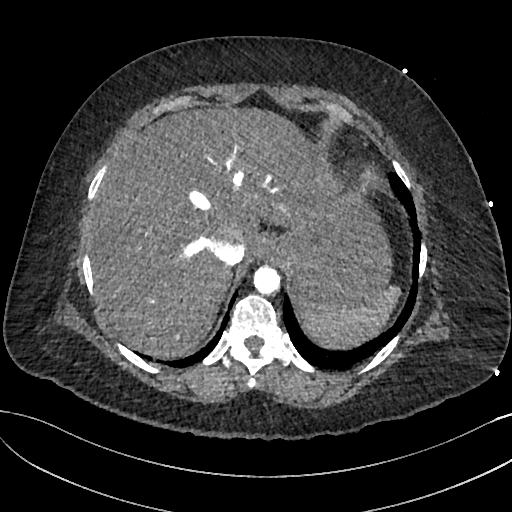
[im 34/136  lung]
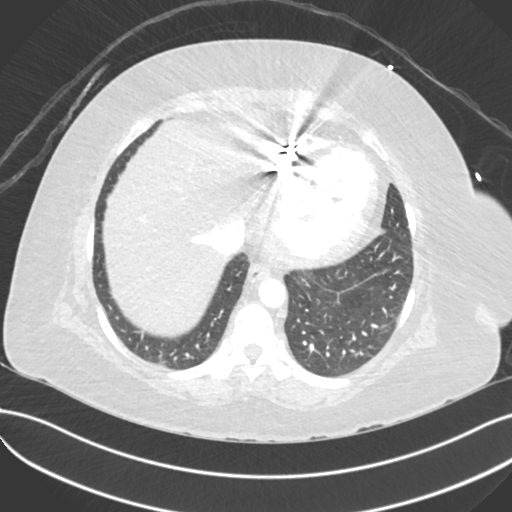
[im 46/136  soft-tissue]
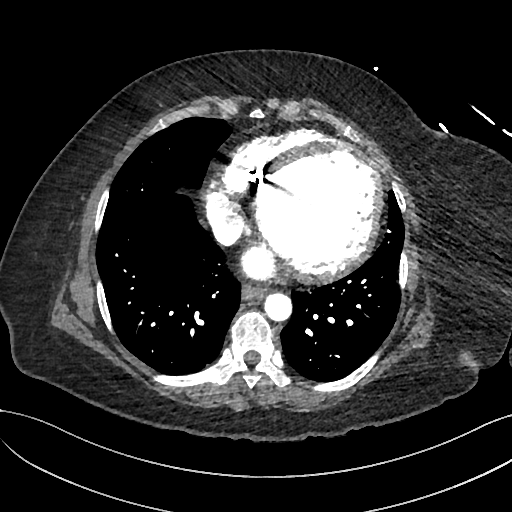
[im 57/136  lung]
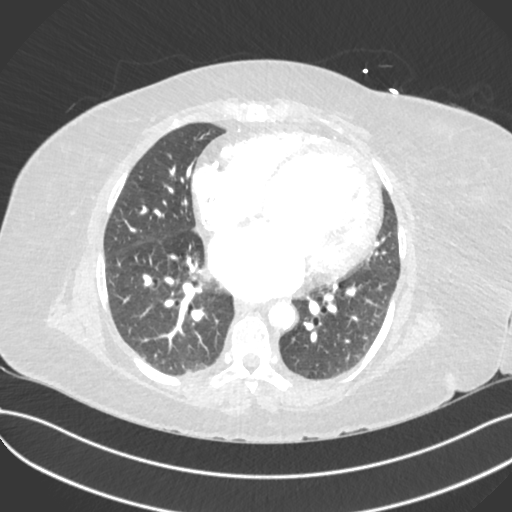
[im 68/136  soft-tissue]
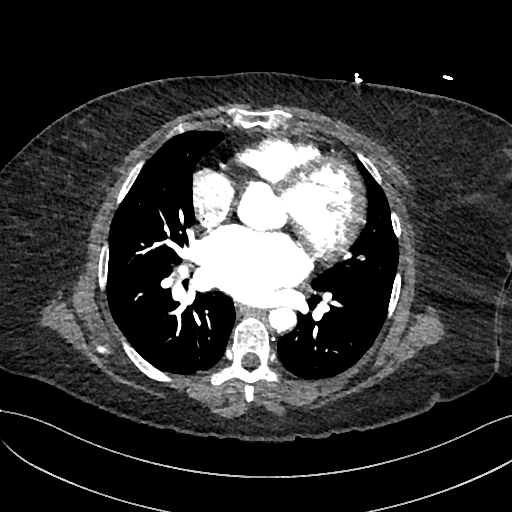
[im 79/136  lung]
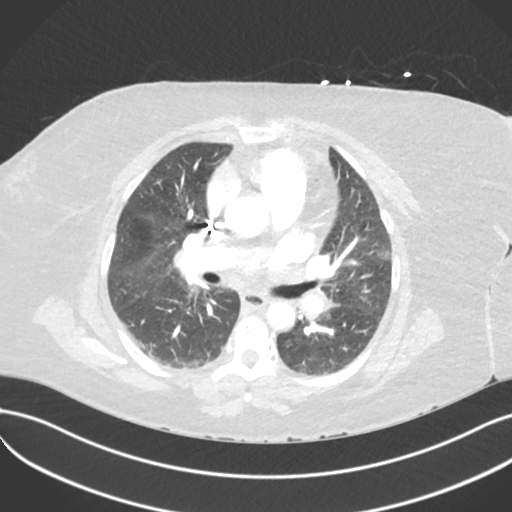
[im 91/136  soft-tissue]
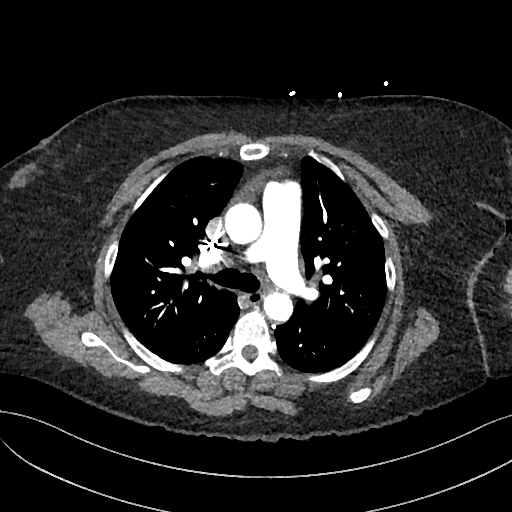
[im 102/136  lung]
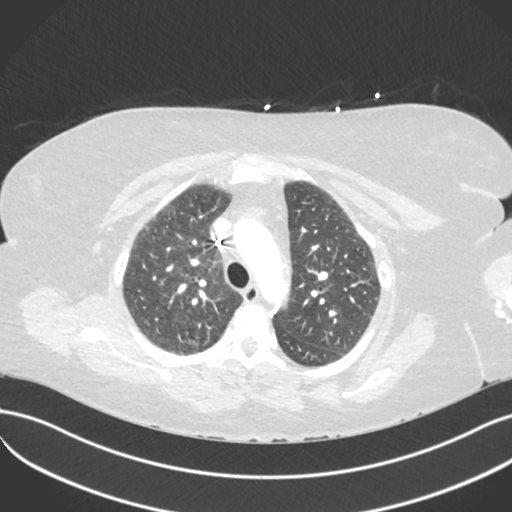
[im 113/136  soft-tissue]
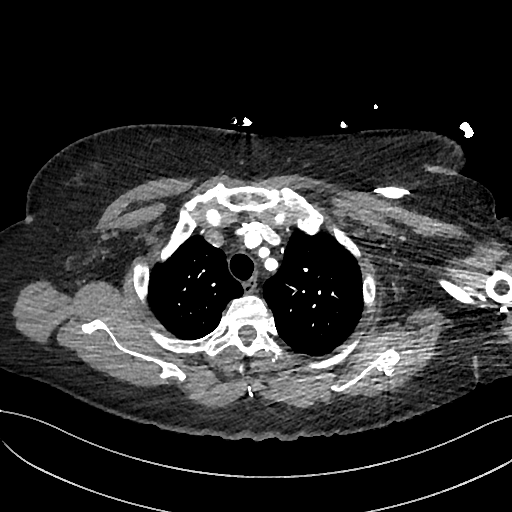
[im 124/136  lung]
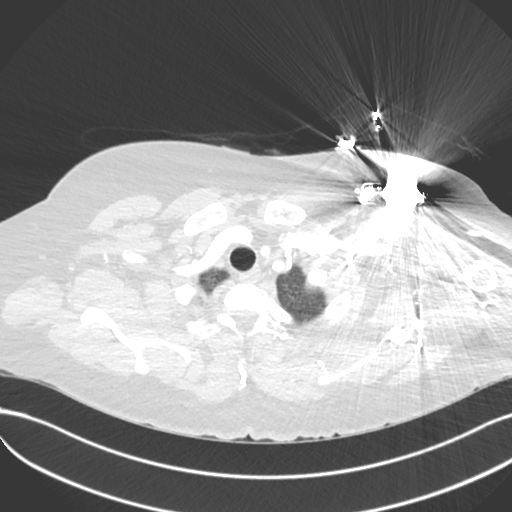

[Series 8: lung · axial · 0.77mm/px · z∈[+1160,+1250]mm · 4 of 136 slices shown]
[im 12/136  soft-tissue]
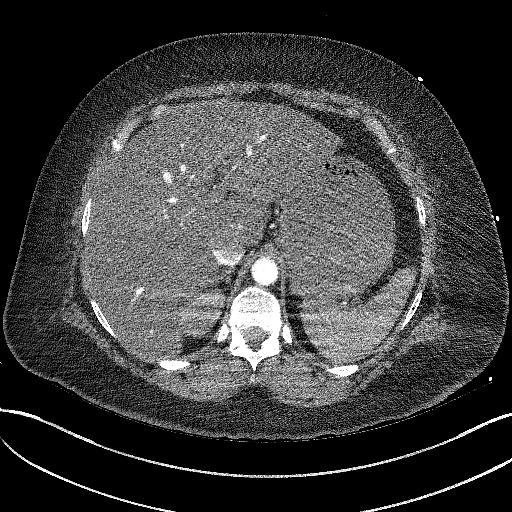
[im 34/136  soft-tissue]
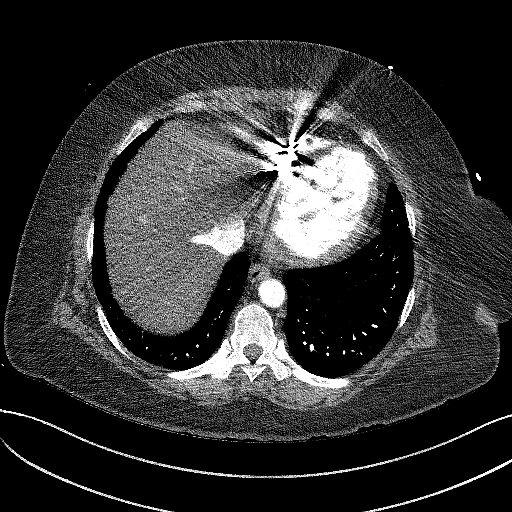
[im 46/136  soft-tissue]
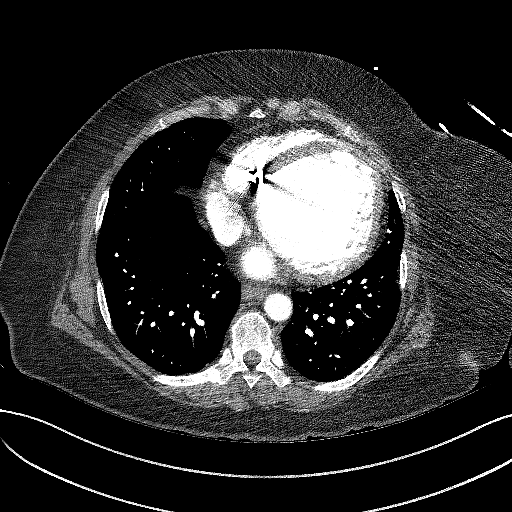
[im 57/136  soft-tissue]
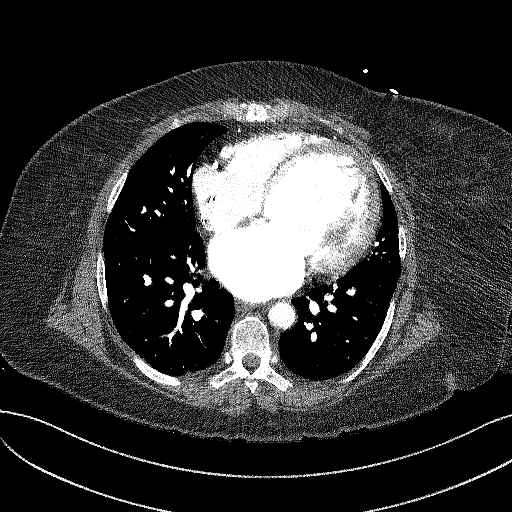

[Series 10: cor · coronal · 0.54mm/px · 3 of 151 slices shown]
[im 38/151  soft-tissue]
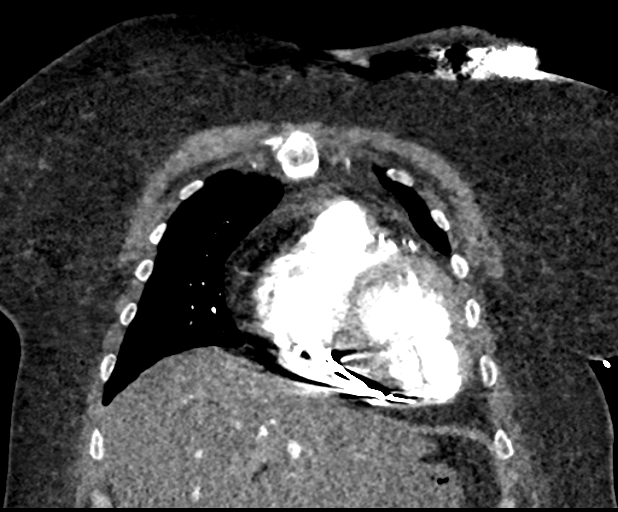
[im 76/151  soft-tissue]
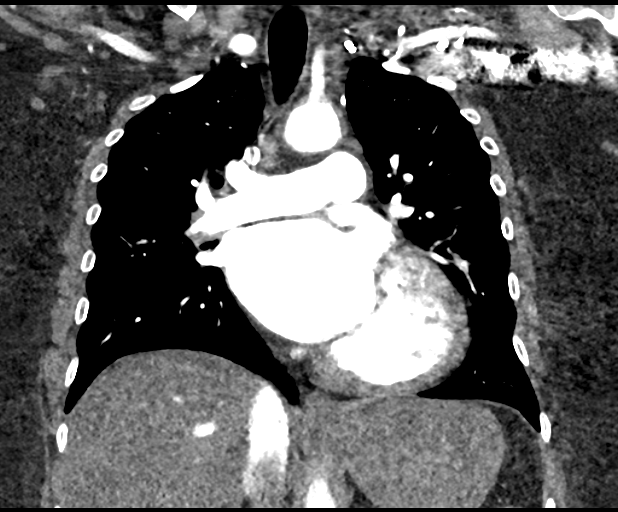
[im 113/151  soft-tissue]
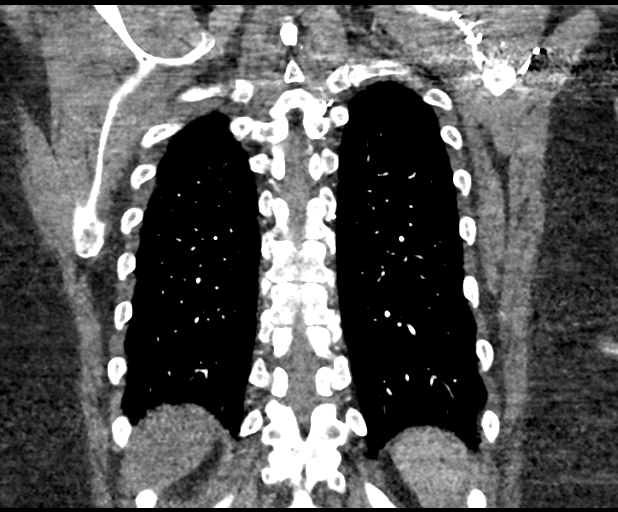

[18 of 46 positions shown; findings below may reference images not displayed]

FINDINGS: Cardiovascular: There is no evidence of thoracic aortic aneurysm or
dissection. Atherosclerotic plaque is noted in the proximal
descending thoracic aorta. Mild cardiomegaly is noted. No
pericardial effusion is noted. Coronary artery calcifications are
noted. No definite evidence of pulmonary embolus is noted.

Mediastinum/Nodes: No enlarged mediastinal, hilar, or axillary lymph
nodes. Thyroid gland, trachea, and esophagus demonstrate no
significant findings.

Lungs/Pleura: No pneumothorax or pleural effusion is noted. 6 mm
nodule is seen posteriorly in the left upper lobe best seen on image
number 22 of series 8. No other pulmonary abnormality is noted.

Upper Abdomen: No acute abnormality.

Musculoskeletal: No chest wall abnormality. No acute or significant
osseous findings.

Review of the MIP images confirms the above findings.
IMPRESSION: No evidence of thoracic aortic dissection or aneurysm is noted.
Small atherosclerotic plaque is noted in the proximal descending
thoracic aorta.

Coronary artery calcifications are noted suggesting coronary artery
disease.

6 mm nodule is noted in left upper lobe. Non-contrast chest CT at
6-12 months is recommended. If the nodule is stable at time of
repeat CT, then future CT at 18-24 months (from today's scan) is
considered optional for low-risk patients, but is recommended for
high-risk patients. This recommendation follows the consensus
statement: Guidelines for Management of Incidental Pulmonary Nodules
Detected on CT Images: From the [HOSPITAL] 2087; Radiology

Aortic Atherosclerosis ([id number]-M7Z.Z).

## 2019-05-31 ENCOUNTER — Ambulatory Visit
Admission: RE | Admit: 2019-05-31 | Discharge: 2019-05-31 | Disposition: A | Discharge status: Home / Self Care | Payer: Medicare Other | Source: Ambulatory Visit | Attending: Family Medicine | Admitting: Family Medicine

## 2019-05-31 ENCOUNTER — Other Ambulatory Visit: Payer: Self-pay

## 2019-05-31 DIAGNOSIS — Z1231 Encounter for screening mammogram for malignant neoplasm of breast: Secondary | ICD-10-CM | POA: Diagnosis not present

## 2019-06-06 ENCOUNTER — Other Ambulatory Visit: Payer: Self-pay | Admitting: Internal Medicine

## 2019-06-06 MED ORDER — AMIODARONE HCL 200 MG PO TABS: 200.0000 mg | ORAL_TABLET | Freq: Two times a day (BID) | ORAL | 6 refills | Status: AC

## 2019-06-13 ENCOUNTER — Telehealth: Payer: Self-pay

## 2019-06-13 ENCOUNTER — Ambulatory Visit (INDEPENDENT_AMBULATORY_CARE_PROVIDER_SITE_OTHER): Payer: Medicare Other

## 2019-06-13 DIAGNOSIS — I5022 Chronic systolic (congestive) heart failure: Secondary | ICD-10-CM | POA: Diagnosis not present

## 2019-06-13 DIAGNOSIS — Z95810 Presence of automatic (implantable) cardiac defibrillator: Secondary | ICD-10-CM | POA: Diagnosis not present

## 2019-06-13 NOTE — Progress Notes (Signed)
EPIC Encounter for ICM Monitoring  Patient Name: Lilliahna Parlato is a 53 y.o. female Date: 06/13/2019 Primary Care Physican: Fayrene Helper, MD Primary Cardiologist: [place] Electrophysiologist: Caryl Comes Last Weight: 213 lbs   Attempted call to patient and unable to reach.  Left detailed message per DPR regarding transmission. Transmission reviewed.    OptivolThoracic impedancesuggesting possible ongoing fluid accumulation since 04/26/2019.  Prescribed:Furosemide40 mgTake1 tablet (40 mgtotal)daily along with 40 mg extra on days you have more fluid.Potassium 20 mEq take 1 tablet daily.  Labs: 12/13/2018 Creatinine 0.96, BUN 14, Potassium 3.8, Sodium 141, GFR 67-78 09/03/2018 Creatinine0.85, BUN6, Potassium 4.3, Sodium139, GFR>60 12/19/2019Creatinine 1.32, BUN15, Potassium4.1, Sodium136, [id number]  A complete set of results can be found in Results Review.  Recommendations: Left voice mail with ICM number and encouraged to call if experiencing any fluid symptoms.  Will have patient take extra Furosemide as prescribed if reached.  Follow-up plan: ICM clinic phone appointment on 06/20/2019 (manual send) to recheck fluid levels.   91 day device clinic remote transmission 07/26/2019.    Copy of ICM check sent to Dr. Caryl Comes and Dr Haroldine Laws.   3 month ICM trend: 06/13/2019    1 Year ICM trend:       [name], RN 06/13/2019 10:32 AM

## 2019-06-13 NOTE — Telephone Encounter (Signed)
Remote ICM transmission received.  Attempted call to patient regarding ICM remote transmission and left detailed message per DPR to return call.  Advised to return call for any fluid symptoms or questions.      

## 2019-06-13 NOTE — Telephone Encounter (Signed)
ICM call to patient to discuss Optivol results.  Symptoms:  None.  Patient denies swelling, shortness of breath or weight gain.   Currently taking: Furosemide 40 mg 2 tablets (80 mg total) daily since beginning of September but prescription reads to take 40 mg daily and take 2nd tablet if experiencing fluid.    Potassium 20 mEq take 1 tablet daily. Metolazone 5 mg prescribed but only takes when directed by physician.   Labs: 12/13/2018 Creatinine 0.96, BUN 14, Potassium 3.8, Sodium 141, GFR 67-78 09/03/2018 Creatinine 0.85, BUN 6,   Potassium 4.3, Sodium 139, GFR >60  Device Remote Transmission Results today: Optivol Thoracic impedance suggesting possible ongoing fluid accumulation since 04/26/2019.  Will send to Dr Haroldine Laws for review/recommendations regarding the following:  1.  Clarify long term Furosemide dosage  2.   Any temporary adjustment needed due to Optivol results.

## 2019-06-13 NOTE — Progress Notes (Addendum)
Spoke with patient.  She is feeling fine and denies any difficulty with breathing, swelling or weight gain.    She has been taking Furosemide 40 mg 2 tablets (80 mg total) daily since September but prescription reads to take 40 mg daily and take 2nd tablet if experiencing fluid.  She asked when should the echo be rescheduled since she had to cancel the one August.  Will send message to HF clinic to reschedule Echo if needed.  Phone note sent to Dr Haroldine Laws for review and recommendations.

## 2019-06-14 ENCOUNTER — Other Ambulatory Visit (HOSPITAL_COMMUNITY): Payer: Self-pay | Admitting: Internal Medicine

## 2019-06-14 ENCOUNTER — Other Ambulatory Visit: Payer: Self-pay | Admitting: Family Medicine

## 2019-06-14 DIAGNOSIS — M62838 Other muscle spasm: Secondary | ICD-10-CM

## 2019-06-14 DIAGNOSIS — M329 Systemic lupus erythematosus, unspecified: Secondary | ICD-10-CM | POA: Diagnosis not present

## 2019-06-14 NOTE — Telephone Encounter (Signed)
If weight stable and no symptoms would not change therapy

## 2019-06-14 NOTE — Telephone Encounter (Signed)
Dr Haroldine Laws - patient is taking Furosemide 80 mg daily instead of PRESCRIBED: 40 mg daily and may take a 2nd tablet for fluid.   How would you like the script for long term dosage script to read?

## 2019-06-15 NOTE — Progress Notes (Addendum)
Phone follow up note reply to Dr Haroldine Laws.  Dr Haroldine Laws - patient is taking Furosemide 80 mg daily instead of PRESCRIBED: 40 mg daily and may take a 2nd tablet for fluid.   How would you like the long term dosage script to read?

## 2019-06-15 NOTE — Progress Notes (Signed)
Attempted call to patient and left message to return call.  °

## 2019-06-15 NOTE — Telephone Encounter (Signed)
Attempted call to patient and no answer. 

## 2019-06-15 NOTE — Progress Notes (Signed)
Attempted call to patient

## 2019-06-15 NOTE — Progress Notes (Signed)
  Per phone note, Dr Haroldine Laws recommended   If weight stable and no symptoms would not change therapy

## 2019-06-17 NOTE — Telephone Encounter (Signed)
Call to patient. Advised Dr Haroldine Laws recommended she stay on current Furosemide dosage and no changes since she is not having any symptoms at this time.  Advised take Furosemide 40 mg daily and a 2nd tablet if she has symptoms.  She voice understanding.

## 2019-06-17 NOTE — Progress Notes (Signed)
Spoke with patient.  Advised no changes are recommended by Dr Haroldine Laws at this time since she is not having symptoms.  Advised to stay on Furosemide 40 mg daily and take 2nd tablet for fluid symptoms.  She verbalized understanding.

## 2019-06-21 ENCOUNTER — Telehealth: Payer: Self-pay

## 2019-06-21 NOTE — Telephone Encounter (Signed)
Left message for patient to remind of missed remote transmission.  

## 2019-06-24 NOTE — Progress Notes (Signed)
No ICM remote transmission received for 06/20/2019 and next ICM transmission scheduled for 07/27/2019.

## 2019-06-29 ENCOUNTER — Telehealth: Payer: Self-pay

## 2019-06-29 NOTE — Telephone Encounter (Signed)
Pt has been taking Roxi pills everyday. Pt was seen  Dr Gwynneth Albright for Lupus in [facility]. Pt said that the Dr advised she does not have signs of Lupus in her kidney so she is no longer seeing her, and will not prescribe any pain medication , the pt has been out of pain medication since Sunday. The Dr has referred her back to her PCP for her joint pain. Roxi 15mg   The Dr has referred her to a pain clinic, but there is not estimate on the time to get the pt seen.

## 2019-06-29 NOTE — Telephone Encounter (Signed)
I cannot start this without office visit , so pls schedule one for next week, thanks

## 2019-06-30 NOTE — Telephone Encounter (Signed)
noted 

## 2019-06-30 NOTE — Telephone Encounter (Signed)
I reached out to the pt, the other Dr called her in some medication for 24 days. I advised to allow 5 days for notification from the pain clinic, and if Dr Moshe Cipro needed to step in until her appointment, we can schedule an appt. She will call back and keep [place] updated.

## 2019-07-18 DIAGNOSIS — M79641 Pain in right hand: Secondary | ICD-10-CM | POA: Diagnosis not present

## 2019-07-18 DIAGNOSIS — Z79891 Long term (current) use of opiate analgesic: Secondary | ICD-10-CM | POA: Diagnosis not present

## 2019-07-18 DIAGNOSIS — G894 Chronic pain syndrome: Secondary | ICD-10-CM | POA: Diagnosis not present

## 2019-07-18 DIAGNOSIS — G8929 Other chronic pain: Secondary | ICD-10-CM | POA: Diagnosis not present

## 2019-07-18 DIAGNOSIS — M546 Pain in thoracic spine: Secondary | ICD-10-CM | POA: Diagnosis not present

## 2019-07-18 DIAGNOSIS — M79642 Pain in left hand: Secondary | ICD-10-CM | POA: Diagnosis not present

## 2019-07-18 DIAGNOSIS — M545 Low back pain: Secondary | ICD-10-CM | POA: Diagnosis not present

## 2019-07-18 DIAGNOSIS — M542 Cervicalgia: Secondary | ICD-10-CM | POA: Diagnosis not present

## 2019-07-19 ENCOUNTER — Other Ambulatory Visit: Payer: Self-pay | Admitting: Family Medicine

## 2019-07-19 DIAGNOSIS — M62838 Other muscle spasm: Secondary | ICD-10-CM

## 2019-07-26 ENCOUNTER — Ambulatory Visit (INDEPENDENT_AMBULATORY_CARE_PROVIDER_SITE_OTHER): Payer: Medicare Other | Admitting: *Deleted

## 2019-07-26 DIAGNOSIS — I5022 Chronic systolic (congestive) heart failure: Secondary | ICD-10-CM

## 2019-07-26 LAB — CUP PACEART REMOTE DEVICE CHECK
Date Time Interrogation Session: 20201208070503
Implantable Lead Implant Date: 20141031
Implantable Lead Location: 753860
Implantable Lead Model: 6935
Implantable Pulse Generator Implant Date: 20141031

## 2019-07-27 ENCOUNTER — Ambulatory Visit (INDEPENDENT_AMBULATORY_CARE_PROVIDER_SITE_OTHER): Payer: Medicare Other

## 2019-07-27 DIAGNOSIS — Z95810 Presence of automatic (implantable) cardiac defibrillator: Secondary | ICD-10-CM | POA: Diagnosis not present

## 2019-07-27 DIAGNOSIS — I5022 Chronic systolic (congestive) heart failure: Secondary | ICD-10-CM

## 2019-07-29 DIAGNOSIS — M546 Pain in thoracic spine: Secondary | ICD-10-CM | POA: Diagnosis not present

## 2019-07-29 DIAGNOSIS — M545 Low back pain: Secondary | ICD-10-CM | POA: Diagnosis not present

## 2019-07-29 DIAGNOSIS — Z79891 Long term (current) use of opiate analgesic: Secondary | ICD-10-CM | POA: Diagnosis not present

## 2019-07-29 DIAGNOSIS — M79642 Pain in left hand: Secondary | ICD-10-CM | POA: Diagnosis not present

## 2019-07-29 DIAGNOSIS — M79641 Pain in right hand: Secondary | ICD-10-CM | POA: Diagnosis not present

## 2019-07-29 DIAGNOSIS — M542 Cervicalgia: Secondary | ICD-10-CM | POA: Diagnosis not present

## 2019-07-29 DIAGNOSIS — G8929 Other chronic pain: Secondary | ICD-10-CM | POA: Diagnosis not present

## 2019-07-29 DIAGNOSIS — G894 Chronic pain syndrome: Secondary | ICD-10-CM | POA: Diagnosis not present

## 2019-07-29 NOTE — Progress Notes (Signed)
EPIC Encounter for ICM Monitoring  Patient Name: [name] is a 53 y.o. female Date: 07/29/2019 Primary Care Physican: Fayrene Helper, MD Primary Cardiologist: [place] Electrophysiologist: Caryl Comes 07/29/2019 Weight: 215 lbs   Spoke with patient.  She says she is feeling fine with exception of occasional swelling of feet but resolves after taking extra Furosemide as needed.   OptivolThoracic impedanceremains unchanged since 04/26/2019 and suggests possible ongoing fluid accumulation.  Prescribed:Furosemide40 mgTake1 tablet (40 mgtotal)daily along with 40 mg extra on days you have more fluid.Potassium 20 mEq take 1 tablet daily.  Labs: 12/13/2018 Creatinine 0.96, BUN 14, Potassium 3.8, Sodium 141, GFR 67-78 09/03/2018 Creatinine0.85, BUN6, Potassium 4.3, Sodium139, GFR>60 12/19/2019Creatinine 1.32, BUN15, Potassium4.1, Sodium136, [id number]  A complete set of results can be found in Results Review.  Recommendations: No changes and encouraged to call if experiencing any fluid symptoms.  Follow-up plan: ICM clinic phone appointment on 09/05/2019.   91 day device clinic remote transmission 10/25/2019.    Copy of ICM check sent to Dr. Caryl Comes.   3 month ICM trend: 07/29/2019    1 Year ICM trend:       [name], RN 07/29/2019 3:22 PM

## 2019-07-31 ENCOUNTER — Telehealth: Payer: Self-pay | Admitting: Family Medicine

## 2019-07-31 DIAGNOSIS — R55 Syncope and collapse: Secondary | ICD-10-CM | POA: Diagnosis not present

## 2019-07-31 DIAGNOSIS — R404 Transient alteration of awareness: Secondary | ICD-10-CM | POA: Diagnosis not present

## 2019-07-31 DIAGNOSIS — Z743 Need for continuous supervision: Secondary | ICD-10-CM | POA: Diagnosis not present

## 2019-08-19 DIAGNOSIS — 419620001 Death: Secondary | SNOMED CT | POA: Diagnosis not present

## 2019-08-19 NOTE — Telephone Encounter (Signed)
Call from EMS around 09;10 this morning reporting patient found at her daughter's home unresponsive, when they arrived no pulse.  States daughter reports that Mother had a syncopal episode in hetr bathroom th day before so she took her over to her home. States recovered and when she checked on her at aprox 2am she was sleeping cpomfortably, later in the morning she found her Mother unresponsive and called EMS. Patient has known CAD and has a defibrillator also. I recommended [facility] directly

## 2019-08-19 DEATH — deceased

## 2019-08-26 NOTE — Progress Notes (Signed)
ICD remote 

## 2019-09-06 ENCOUNTER — Ambulatory Visit: Payer: Medicare Other | Admitting: Family Medicine

## 2019-12-15 ENCOUNTER — Ambulatory Visit: Payer: Medicare Other | Admitting: Family Medicine
